# Patient Record
Sex: Female | Born: 1965 | Race: Black or African American | Hispanic: No | Marital: Married | Smoking: Current some day smoker
Health system: Southern US, Community
[De-identification: ages and names within clinical notes are randomized; demographics above are authoritative.]

## PROBLEM LIST (undated history)

## (undated) ENCOUNTER — Emergency Department (HOSPITAL_COMMUNITY): Disposition: A | Discharge status: Other Acute Inpatient Hospital | Payer: Self-pay

## (undated) DIAGNOSIS — R7303 Prediabetes: Secondary | ICD-10-CM

## (undated) DIAGNOSIS — I639 Cerebral infarction, unspecified: Secondary | ICD-10-CM

## (undated) DIAGNOSIS — E782 Mixed hyperlipidemia: Secondary | ICD-10-CM

## (undated) DIAGNOSIS — E079 Disorder of thyroid, unspecified: Secondary | ICD-10-CM

## (undated) DIAGNOSIS — Z8673 Personal history of transient ischemic attack (TIA), and cerebral infarction without residual deficits: Secondary | ICD-10-CM

## (undated) DIAGNOSIS — E785 Hyperlipidemia, unspecified: Secondary | ICD-10-CM

## (undated) DIAGNOSIS — E039 Hypothyroidism, unspecified: Secondary | ICD-10-CM

## (undated) DIAGNOSIS — I10 Essential (primary) hypertension: Secondary | ICD-10-CM

## (undated) HISTORY — PX: OTHER SURGICAL HISTORY: SHX169

## (undated) HISTORY — PX: THYROIDECTOMY: SHX17

## (undated) HISTORY — DX: Prediabetes: R73.03

## (undated) HISTORY — PX: PARTIAL HYSTERECTOMY: SHX80

## (undated) HISTORY — DX: Cerebral infarction, unspecified: I63.9

## (undated) HISTORY — DX: Personal history of transient ischemic attack (TIA), and cerebral infarction without residual deficits: Z86.73

## (undated) HISTORY — DX: Mixed hyperlipidemia: E78.2

## (undated) HISTORY — DX: Hyperlipidemia, unspecified: E78.5

---

## 1999-03-25 ENCOUNTER — Ambulatory Visit (HOSPITAL_BASED_OUTPATIENT_CLINIC_OR_DEPARTMENT_OTHER): Admission: RE | Admit: 1999-03-25 | Discharge: 1999-03-25 | Payer: Self-pay | Admitting: Orthopedic Surgery

## 1999-04-24 ENCOUNTER — Encounter: Payer: Self-pay | Admitting: Emergency Medicine

## 1999-04-24 ENCOUNTER — Emergency Department (HOSPITAL_COMMUNITY): Admission: EM | Admit: 1999-04-24 | Discharge: 1999-04-24 | Payer: Self-pay | Admitting: Emergency Medicine

## 1999-12-29 ENCOUNTER — Emergency Department (HOSPITAL_COMMUNITY): Admission: EM | Admit: 1999-12-29 | Discharge: 1999-12-29 | Payer: Self-pay | Admitting: Emergency Medicine

## 1999-12-29 ENCOUNTER — Encounter: Payer: Self-pay | Admitting: Emergency Medicine

## 2000-06-14 ENCOUNTER — Encounter: Payer: Self-pay | Admitting: *Deleted

## 2000-06-14 ENCOUNTER — Observation Stay (HOSPITAL_COMMUNITY): Admission: EM | Admit: 2000-06-14 | Discharge: 2000-06-15 | Payer: Self-pay | Admitting: Podiatry

## 2000-06-15 ENCOUNTER — Encounter: Payer: Self-pay | Admitting: Neurosurgery

## 2000-06-16 ENCOUNTER — Encounter: Payer: Self-pay | Admitting: Neurosurgery

## 2000-06-16 ENCOUNTER — Emergency Department (HOSPITAL_COMMUNITY): Admission: EM | Admit: 2000-06-16 | Discharge: 2000-06-16 | Payer: Self-pay | Admitting: Emergency Medicine

## 2000-08-02 ENCOUNTER — Emergency Department (HOSPITAL_COMMUNITY): Admission: EM | Admit: 2000-08-02 | Discharge: 2000-08-02 | Payer: Self-pay | Admitting: Emergency Medicine

## 2000-08-26 ENCOUNTER — Emergency Department (HOSPITAL_COMMUNITY): Admission: EM | Admit: 2000-08-26 | Discharge: 2000-08-27 | Payer: Self-pay | Admitting: Emergency Medicine

## 2002-07-31 ENCOUNTER — Encounter: Payer: Self-pay | Admitting: Emergency Medicine

## 2002-07-31 ENCOUNTER — Emergency Department (HOSPITAL_COMMUNITY): Admission: EM | Admit: 2002-07-31 | Discharge: 2002-07-31 | Payer: Self-pay | Admitting: Emergency Medicine

## 2002-08-04 ENCOUNTER — Emergency Department (HOSPITAL_COMMUNITY): Admission: EM | Admit: 2002-08-04 | Discharge: 2002-08-04 | Payer: Self-pay | Admitting: Emergency Medicine

## 2002-08-04 ENCOUNTER — Encounter: Payer: Self-pay | Admitting: Emergency Medicine

## 2002-09-26 ENCOUNTER — Encounter: Payer: Self-pay | Admitting: Internal Medicine

## 2002-09-26 ENCOUNTER — Ambulatory Visit (HOSPITAL_COMMUNITY): Admission: RE | Admit: 2002-09-26 | Discharge: 2002-09-26 | Payer: Self-pay | Admitting: Internal Medicine

## 2002-11-05 ENCOUNTER — Encounter (HOSPITAL_COMMUNITY): Admission: RE | Admit: 2002-11-05 | Discharge: 2003-02-03 | Payer: Self-pay | Admitting: Internal Medicine

## 2002-11-06 ENCOUNTER — Encounter: Payer: Self-pay | Admitting: Internal Medicine

## 2002-11-29 HISTORY — PX: THYMECTOMY: SHX1063

## 2002-12-12 ENCOUNTER — Emergency Department (HOSPITAL_COMMUNITY): Admission: EM | Admit: 2002-12-12 | Discharge: 2002-12-13 | Payer: Self-pay | Admitting: Emergency Medicine

## 2002-12-13 ENCOUNTER — Encounter: Payer: Self-pay | Admitting: Emergency Medicine

## 2003-01-10 ENCOUNTER — Encounter: Payer: Self-pay | Admitting: Cardiothoracic Surgery

## 2003-01-10 ENCOUNTER — Encounter: Admission: RE | Admit: 2003-01-10 | Discharge: 2003-01-10 | Payer: Self-pay | Admitting: Cardiothoracic Surgery

## 2003-01-18 ENCOUNTER — Encounter: Payer: Self-pay | Admitting: Cardiothoracic Surgery

## 2003-01-22 ENCOUNTER — Inpatient Hospital Stay (HOSPITAL_COMMUNITY): Admission: RE | Admit: 2003-01-22 | Discharge: 2003-01-24 | Payer: Self-pay | Admitting: Cardiothoracic Surgery

## 2003-01-22 ENCOUNTER — Encounter: Payer: Self-pay | Admitting: Cardiothoracic Surgery

## 2003-01-22 ENCOUNTER — Encounter (INDEPENDENT_AMBULATORY_CARE_PROVIDER_SITE_OTHER): Payer: Self-pay | Admitting: *Deleted

## 2003-01-23 ENCOUNTER — Encounter: Payer: Self-pay | Admitting: Cardiothoracic Surgery

## 2003-01-25 ENCOUNTER — Encounter: Payer: Self-pay | Admitting: Emergency Medicine

## 2003-01-25 ENCOUNTER — Emergency Department (HOSPITAL_COMMUNITY): Admission: EM | Admit: 2003-01-25 | Discharge: 2003-01-25 | Payer: Self-pay | Admitting: Emergency Medicine

## 2003-02-21 ENCOUNTER — Encounter: Payer: Self-pay | Admitting: Cardiothoracic Surgery

## 2003-02-21 ENCOUNTER — Encounter: Admission: RE | Admit: 2003-02-21 | Discharge: 2003-02-21 | Payer: Self-pay | Admitting: Cardiothoracic Surgery

## 2003-08-12 ENCOUNTER — Emergency Department (HOSPITAL_COMMUNITY): Admission: EM | Admit: 2003-08-12 | Discharge: 2003-08-12 | Payer: Self-pay | Admitting: Emergency Medicine

## 2003-08-12 ENCOUNTER — Encounter: Payer: Self-pay | Admitting: Emergency Medicine

## 2003-11-04 ENCOUNTER — Emergency Department (HOSPITAL_COMMUNITY): Admission: EM | Admit: 2003-11-04 | Discharge: 2003-11-04 | Payer: Self-pay | Admitting: Emergency Medicine

## 2005-02-28 ENCOUNTER — Emergency Department (HOSPITAL_COMMUNITY): Admission: EM | Admit: 2005-02-28 | Discharge: 2005-02-28 | Payer: Self-pay

## 2005-05-23 ENCOUNTER — Emergency Department (HOSPITAL_COMMUNITY): Admission: EM | Admit: 2005-05-23 | Discharge: 2005-05-23 | Payer: Self-pay | Admitting: Emergency Medicine

## 2005-05-25 ENCOUNTER — Emergency Department (HOSPITAL_COMMUNITY): Admission: EM | Admit: 2005-05-25 | Discharge: 2005-05-25 | Payer: Self-pay | Admitting: Emergency Medicine

## 2005-12-04 ENCOUNTER — Emergency Department (HOSPITAL_COMMUNITY): Admission: EM | Admit: 2005-12-04 | Discharge: 2005-12-04 | Payer: Self-pay | Admitting: Emergency Medicine

## 2006-01-15 ENCOUNTER — Emergency Department (HOSPITAL_COMMUNITY): Admission: EM | Admit: 2006-01-15 | Discharge: 2006-01-15 | Payer: Self-pay | Admitting: Emergency Medicine

## 2006-02-03 ENCOUNTER — Emergency Department (HOSPITAL_COMMUNITY): Admission: EM | Admit: 2006-02-03 | Discharge: 2006-02-03 | Payer: Self-pay | Admitting: Family Medicine

## 2006-12-01 ENCOUNTER — Emergency Department (HOSPITAL_COMMUNITY): Admission: EM | Admit: 2006-12-01 | Discharge: 2006-12-01 | Payer: Self-pay | Admitting: Emergency Medicine

## 2007-11-01 ENCOUNTER — Emergency Department (HOSPITAL_COMMUNITY): Admission: EM | Admit: 2007-11-01 | Discharge: 2007-11-01 | Payer: Self-pay | Admitting: Family Medicine

## 2008-02-08 ENCOUNTER — Emergency Department (HOSPITAL_COMMUNITY): Admission: EM | Admit: 2008-02-08 | Discharge: 2008-02-08 | Payer: Self-pay | Admitting: Family Medicine

## 2008-02-13 ENCOUNTER — Emergency Department (HOSPITAL_COMMUNITY): Admission: EM | Admit: 2008-02-13 | Discharge: 2008-02-13 | Payer: Self-pay | Admitting: Emergency Medicine

## 2008-02-27 ENCOUNTER — Emergency Department (HOSPITAL_COMMUNITY): Admission: EM | Admit: 2008-02-27 | Discharge: 2008-02-27 | Payer: Self-pay | Admitting: Emergency Medicine

## 2008-03-11 ENCOUNTER — Observation Stay (HOSPITAL_COMMUNITY): Admission: EM | Admit: 2008-03-11 | Discharge: 2008-03-12 | Payer: Self-pay | Admitting: Emergency Medicine

## 2008-03-19 ENCOUNTER — Ambulatory Visit (HOSPITAL_COMMUNITY): Admission: RE | Admit: 2008-03-19 | Discharge: 2008-03-19 | Payer: Self-pay | Admitting: Cardiovascular Disease

## 2008-04-05 ENCOUNTER — Emergency Department (HOSPITAL_COMMUNITY): Admission: EM | Admit: 2008-04-05 | Discharge: 2008-04-05 | Payer: Self-pay | Admitting: Emergency Medicine

## 2008-05-01 ENCOUNTER — Emergency Department (HOSPITAL_COMMUNITY): Admission: EM | Admit: 2008-05-01 | Discharge: 2008-05-01 | Payer: Self-pay | Admitting: Family Medicine

## 2008-06-29 ENCOUNTER — Emergency Department (HOSPITAL_COMMUNITY): Admission: EM | Admit: 2008-06-29 | Discharge: 2008-06-30 | Payer: Self-pay | Admitting: Emergency Medicine

## 2008-06-30 ENCOUNTER — Emergency Department (HOSPITAL_COMMUNITY): Admission: EM | Admit: 2008-06-30 | Discharge: 2008-06-30 | Payer: Self-pay | Admitting: Emergency Medicine

## 2008-07-18 ENCOUNTER — Emergency Department (HOSPITAL_COMMUNITY): Admission: EM | Admit: 2008-07-18 | Discharge: 2008-07-18 | Payer: Self-pay | Admitting: Emergency Medicine

## 2008-11-08 IMAGING — CR DG CHEST 1V PORT
1 series · 1 of 1 positions shown · non-contrast
Comparison: 03/11/2008

CLINICAL DATA: Dyspnea.

PORTABLE CHEST - 1 VIEW

[AP]
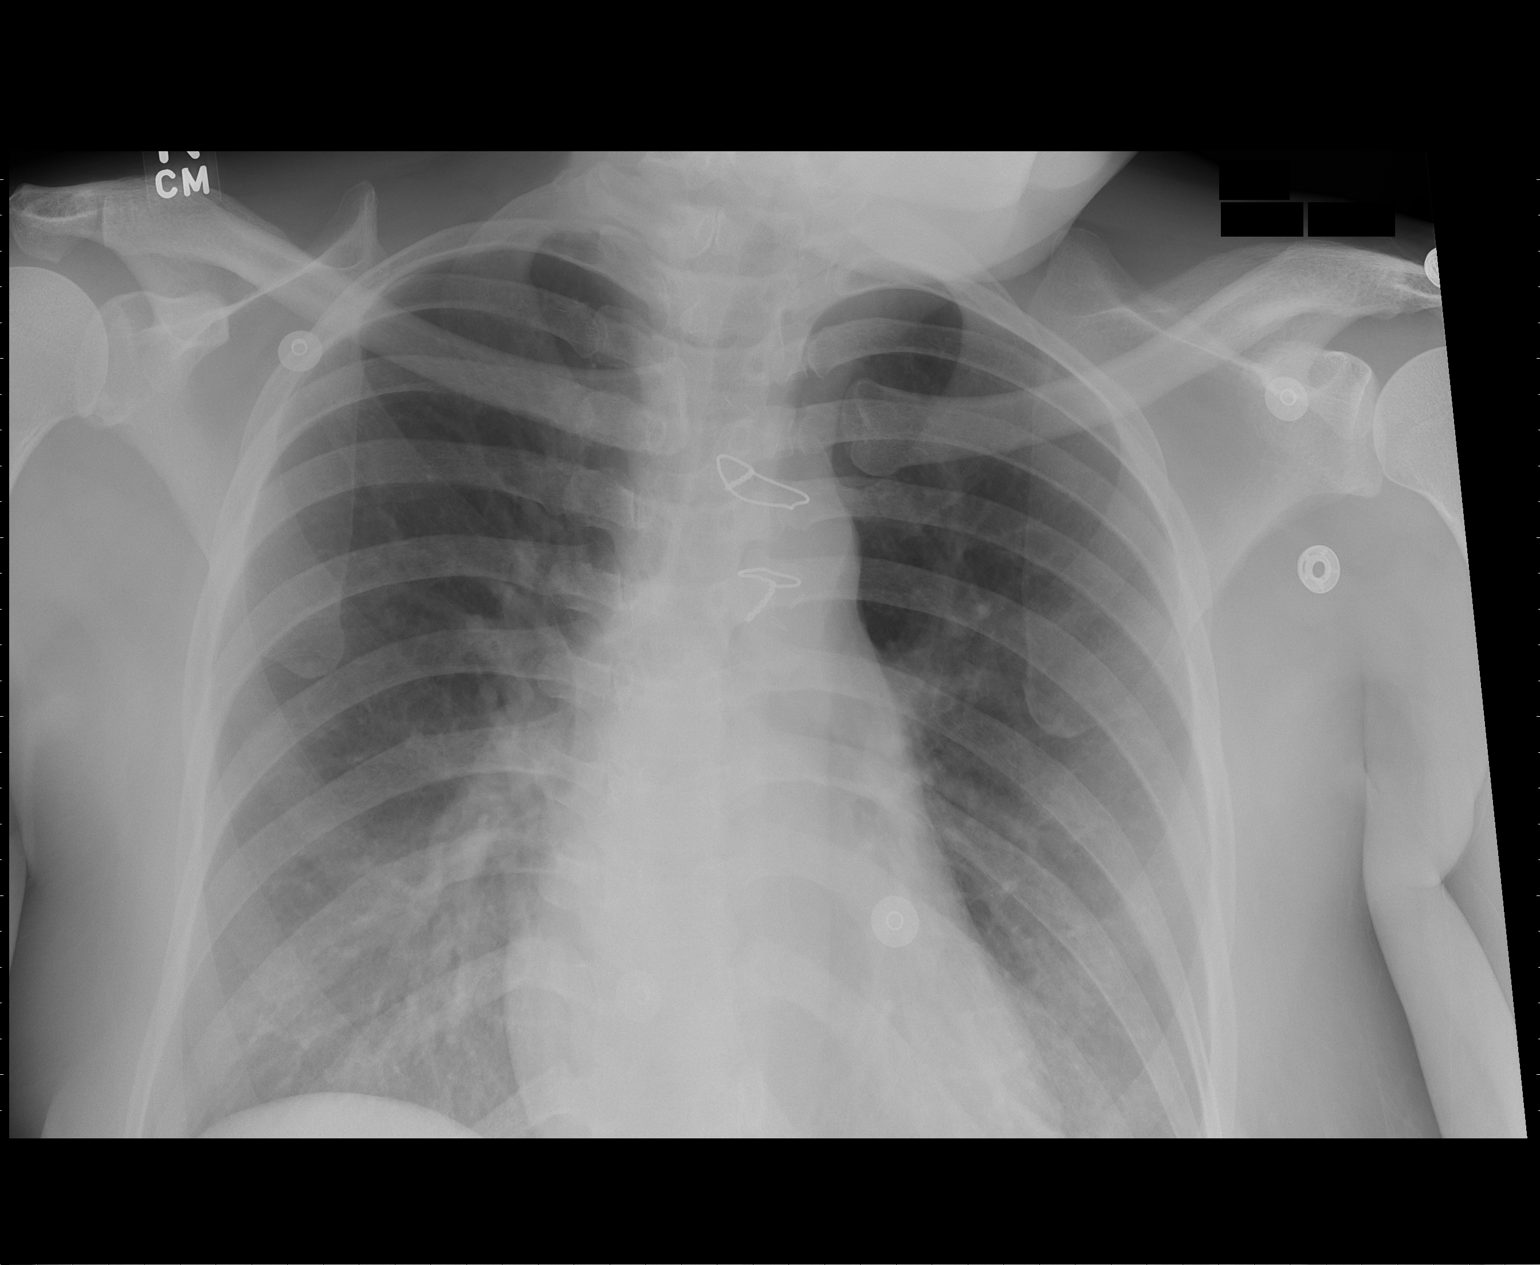

[1 of 1 positions shown; findings below may reference images not displayed]

FINDINGS: Prior median sternotomy.  Midline trachea.  Heart size
upper limits of normal.  Left costophrenic angle partially
excluded.  No pneumothorax or pleural effusion.  Mild pulmonary
interstitial prominence.
IMPRESSION: 1.  Borderline cardiomegaly with mild pulmonary interstitial
prominence.  This could be due to AP portable technique or mild
pulmonary venous congestion.  No overt congestive failure.

## 2009-06-02 ENCOUNTER — Emergency Department (HOSPITAL_COMMUNITY): Admission: EM | Admit: 2009-06-02 | Discharge: 2009-06-02 | Payer: Self-pay | Admitting: Emergency Medicine

## 2009-06-04 ENCOUNTER — Emergency Department (HOSPITAL_COMMUNITY): Admission: EM | Admit: 2009-06-04 | Discharge: 2009-06-04 | Payer: Self-pay | Admitting: Family Medicine

## 2009-09-03 ENCOUNTER — Emergency Department (HOSPITAL_COMMUNITY): Admission: EM | Admit: 2009-09-03 | Discharge: 2009-09-03 | Payer: Self-pay | Admitting: Family Medicine

## 2009-12-11 ENCOUNTER — Emergency Department (HOSPITAL_COMMUNITY): Admission: EM | Admit: 2009-12-11 | Discharge: 2009-12-11 | Payer: Self-pay | Admitting: Emergency Medicine

## 2010-01-03 ENCOUNTER — Emergency Department (HOSPITAL_COMMUNITY): Admission: EM | Admit: 2010-01-03 | Discharge: 2010-01-04 | Payer: Self-pay | Admitting: Emergency Medicine

## 2010-07-27 ENCOUNTER — Emergency Department (HOSPITAL_COMMUNITY): Admission: EM | Admit: 2010-07-27 | Discharge: 2010-07-27 | Payer: Self-pay | Admitting: Family Medicine

## 2010-10-11 ENCOUNTER — Emergency Department (HOSPITAL_COMMUNITY)
Admission: EM | Admit: 2010-10-11 | Discharge: 2010-10-11 | Payer: Self-pay | Source: Home / Self Care | Admitting: Family Medicine

## 2011-01-12 ENCOUNTER — Inpatient Hospital Stay (INDEPENDENT_AMBULATORY_CARE_PROVIDER_SITE_OTHER)
Admission: RE | Admit: 2011-01-12 | Discharge: 2011-01-12 | Disposition: A | Discharge status: Home / Self Care | Payer: Self-pay | Source: Ambulatory Visit | Attending: Emergency Medicine | Admitting: Emergency Medicine

## 2011-01-12 DIAGNOSIS — F410 Panic disorder [episodic paroxysmal anxiety] without agoraphobia: Secondary | ICD-10-CM

## 2011-02-19 LAB — POCT I-STAT, CHEM 8
Glucose, Bld: 93 mg/dL (ref 70–99)
HCT: 42 % (ref 36.0–46.0)
Hemoglobin: 14.3 g/dL (ref 12.0–15.0)
Potassium: 3.9 mEq/L (ref 3.5–5.1)
Sodium: 138 mEq/L (ref 135–145)

## 2011-02-19 LAB — CBC
HCT: 38.6 % (ref 36.0–46.0)
Platelets: 183 10*3/uL (ref 150–400)
RDW: 14.2 % (ref 11.5–15.5)

## 2011-03-04 ENCOUNTER — Inpatient Hospital Stay (INDEPENDENT_AMBULATORY_CARE_PROVIDER_SITE_OTHER)
Admission: RE | Admit: 2011-03-04 | Discharge: 2011-03-04 | Disposition: A | Discharge status: Home / Self Care | Payer: Self-pay | Source: Ambulatory Visit

## 2011-03-04 DIAGNOSIS — R112 Nausea with vomiting, unspecified: Secondary | ICD-10-CM
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
## 2011-04-13 NOTE — Discharge Summary (Signed)
Meagan Flores, Meagan Flores                  ACCOUNT NO.:  [id number]   MEDICAL RECORD NO.:  [id number]          PATIENT TYPE:  INP   LOCATION:  3705                         FACILITY:  [facility]   PHYSICIAN:  Isidor Holts, M.D.  DATE OF BIRTH:  1966/06/12   DATE OF ADMISSION:  03/11/2008  DATE OF DISCHARGE:  03/12/2008                               DISCHARGE SUMMARY   PRIMARY CARE PHYSICIAN:  Unassigned.   DISCHARGE DIAGNOSES:  1. Atypical chest pain, i.e., musculoskeletal.  2. Smoking history.  3. Graves disease/hyperthyroidism.  4. Mild microcytic anemia.  5. Right adnexial cyst.   DISCHARGE MEDICATIONS:  1. Motrin 600 mg p.o. t.i.d. with food for 1 week only.  2. Vicodin 35/500 one p.o. p.r.n. q.6h.  A total of 20 pills have been      dispensed.   PROCEDURES:  1. Chest x-ray dated March 11, 2008.  This showed no acute      cardiopulmonary process.  There was normal mediastinum and cardiac      silhouette.  Costophrenic angles are clear.  2. Chest CT angiogram dated March 12, 2008.  This showed no evidence      of acute pulmonary embolus.  There was thyroid enlargement      compatible with Graves disease.  Right anterior prevascular space      soft tissue may reflect residual thymus or postoperative scarring.  3. Abdominal CT scan dated March 12, 2008.  This showed no acute      findings in the abdomen.  4. Pelvic CT scan dated March 12, 2008.  This showed 9 mm right      adnexal cystic lesion which may be physiologic in a premenopausal      outpatient.  Follow-up ultrasound of pelvis is recommended in six      weeks to evaluate regression/stability.   CONSULTATION:  [facility].   ADMISSION HISTORY:  As in H&P notes of March 11, 2008, dictated by Dr.  Buena Irish.  However, in brief, this is a 45 year old female,  with known history of hyperthyroidism secondary to Graves disease.  Currently on follow-up at [facility] in [facility], status  post  thymectomy, status post partial hysterectomy, history of surgery  right arm ulnar nerve x2, smoking history, who presents with a 12-hour  history of chest pain which she describes as a pulling sensation when  she lifts her arm and is associated with localized chest wall soreness.  She had previously been to the emergency department, discharged and then  represented.  She was therefore admitted for further evaluation,  investigation and management.   CLINICAL COURSE:  1. Chest pain.  This certainly has atypical features.  However, the      patient does smoke.  She was therefore placed on telemetric      monitoring.  Cardiac enzymes were cycled and remained unelevated.      A 12-lead EKG showed no acute ischemic changes.  The patient was      managed with NSAID therapy and by March 12, 2008, a.m. was  asymptomatic.  Chest CT angiogram showed no evidence of pulmonary      embolism.  The patient has therefore been reassured accordingly.      In view of the fact that she is a smoker and that she has presented      at least twice to the emergency department, it is felt appropriate      to arrange an outpatient stress test.  [facility] has therefore been consulted for this purpose.   1. Smoking history.  The patient was counseled appropriately.   1. Hyperthyroidism.  The patient's TSH was still significantly low at      0.005, however, she is on follow-up at the Endocrinology Clinic at      [facility]  She has been urged to continue to follow up      there.   1. Microcytic anemia.  The patient had a mild microcytic anemia with      hemoglobin 10.9, MCV 72.4.  In this African American patient, it is      likely that this is due to thalassemia trait.  However, it will      bear follow-up, on an outpatient basis.   1. Right adnexal cyst.  This was an incidental finding on abdominal CT      scan of March 12, 2008, and likely represents a benign finding,       however, ultrasound follow-up for interval change is recommended in      six weeks.  This has been communicated to the patient.   DISPOSITION:  The patient was, on March 12, 2008, considered clinically  stable for discharge.  She was therefore discharged accordingly.  She is recommended to return to regular duties on February 14, 2008.   DIET:  No restrictions.   ACTIVITY:  As tolerated.   FOLLOW-UP INSTRUCTIONS:  The patient has assured me that she is going to  [facility] on March 14, 2008, to register for a primary MD follow-up.  She has therefore been strongly urged to keep this appointment, that she  will require primary MD for routine and preventative care.  [facility] will contact patient to schedule  outpatient stress testing on a date to be determined. Tel#[phone].   SPECIAL INSTRUCTIONS:  It is recommended that the patient's primary MD  arrange a pelvic ultrasound scan in six weeks to evaluate interval  evolution of right adnexal cyst.  Also, as mentioned above, the  patient's hemoglobin will bear monitoring on an outpatient basis.      Isidor Holts, M.D.  Electronically Signed     CO/MEDQ  D:  03/12/2008  T:  03/12/2008  Job:  [id number]   cc:   Melvern Banker

## 2011-04-14 ENCOUNTER — Emergency Department (HOSPITAL_COMMUNITY)
Admission: EM | Admit: 2011-04-14 | Discharge: 2011-04-15 | Discharge status: Against Medical Advice | Payer: Self-pay | Attending: Emergency Medicine | Admitting: Emergency Medicine

## 2011-04-14 DIAGNOSIS — R5381 Other malaise: Secondary | ICD-10-CM | POA: Insufficient documentation

## 2011-04-14 DIAGNOSIS — R112 Nausea with vomiting, unspecified: Secondary | ICD-10-CM | POA: Insufficient documentation
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
## 2011-04-16 NOTE — Op Note (Signed)
NAME:  Meagan Flores, CAMPOY                            ACCOUNT NO.:  [id number]   MEDICAL RECORD NO.:  [id number]                   PATIENT TYPE:  INP   LOCATION:  3305                                 FACILITY:  [facility]   PHYSICIAN:  Gwenith Daily. Tyrone Sage, M.D.            DATE OF BIRTH:  07/25/1966   DATE OF PROCEDURE:  01/23/2003  DATE OF DISCHARGE:                                 OPERATIVE REPORT   PREOPERATIVE DIAGNOSIS:  Thymic enlargement.   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   OPERATION PERFORMED:  Partial sternotomy with thymectomy.   SURGEON:  Gwenith Daily. Tyrone Sage, M.D.   ASSISTANT:  Toribio Harbour, R.N.   ANESTHESIA:  General.   INDICATIONS FOR PROCEDURE:  The patient is a 45 year old female who has been  followed at [facility] since September of 2003.  Initially she  presented with vague chest discomfort and a CT scan of the chest was  obtained which showed a thymic enlargement.  Follow-up scans showed the  progressive enlargement of the thymus.  PET scan showed some vague area of  uptake in the left hilar region but was nonspecific.  Because of the  persistent nature of this enlargement and the question of a positive PET  scan, obtaining thymic tissue was recommended to the patient who agreed and  signed informed consent.   DESCRIPTION OF PROCEDURE:  The patient underwent general endotracheal  anesthesia without incident.  The chest was prepped with Betadine and draped  in the usual sterile manner.  An incision was made over the upper portion of  the sternum.  The manubrium was divided with sagittal saw using a lamina  spreader.  Access to the superior mediastinum was obtained.  The thymus was  identified.  It appeared to be bilobed without any definite mass lesion.  There was no adhesion.  Using careful teasing of the thymic tissue, the  thymus was removed with no evidence of an invasion.  A portion of this  was  sent for frozen section and showed only thymic hyperplasia without any  evidence of mass or lymphoma.  With the operative field hemostatic, the  incision was closed with two sternal wires in the manubrium, interrupted 0  Vicryl in the fascia, 2-0 Vicryl in the subcutaneous tissue and a 4-0  subcuticular stitch in the skin edge.  Dry dressings were applied.  The  patient was awakened and extubated in the operating room.  The estimated  blood loss was minimal.                                                 Ramon Dredge B. Tyrone Sage, M.D.    Tyson Babinski  D:  01/23/2003  T:  01/24/2003  Job:  [id number]   cc:   [facility]
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
## 2011-08-23 LAB — POCT URINALYSIS DIP (DEVICE)
Bilirubin Urine: NEGATIVE
Nitrite: NEGATIVE
Urobilinogen, UA: 1
pH: 5.5

## 2011-08-23 LAB — URINALYSIS, ROUTINE W REFLEX MICROSCOPIC
Bilirubin Urine: NEGATIVE
Ketones, ur: NEGATIVE
Nitrite: NEGATIVE
Protein, ur: NEGATIVE
Specific Gravity, Urine: 1.031 — ABNORMAL HIGH
Urobilinogen, UA: 1

## 2011-08-23 LAB — CBC
HCT: 31.9 — ABNORMAL LOW
MCHC: 33.5
MCV: 72.8 — ABNORMAL LOW
Platelets: 205
RDW: 13.3

## 2011-08-23 LAB — DIFFERENTIAL
Basophils Absolute: 0
Basophils Relative: 0
Eosinophils Absolute: 0.1
Eosinophils Relative: 1
Monocytes Absolute: 1

## 2011-08-23 LAB — D-DIMER, QUANTITATIVE: D-Dimer, Quant: 0.29

## 2011-08-23 LAB — POCT I-STAT, CHEM 8
Calcium, Ion: 1.14
Glucose, Bld: 102 — ABNORMAL HIGH
HCT: 34 — ABNORMAL LOW
TCO2: 27

## 2011-08-23 LAB — POCT CARDIAC MARKERS
CKMB, poc: <1 — ABNORMAL LOW
Operator id: 277751
Operator id: 277751
Troponin i, poc: <0.05

## 2011-08-23 LAB — POCT PREGNANCY, URINE: Operator id: 277751

## 2011-08-24 LAB — COMPREHENSIVE METABOLIC PANEL
ALT: 22
Alkaline Phosphatase: 58
CO2: 23
Chloride: 108
Glucose, Bld: 116 — ABNORMAL HIGH
Potassium: 3.8
Sodium: 139
Total Protein: 5.8 — ABNORMAL LOW

## 2011-08-24 LAB — POCT CARDIAC MARKERS
CKMB, poc: <1 — ABNORMAL LOW
Myoglobin, poc: 47.1
Operator id: 277751
Troponin i, poc: <0.05

## 2011-08-24 LAB — URINE CULTURE

## 2011-08-24 LAB — IRON AND TIBC: UIBC: 164

## 2011-08-24 LAB — DIFFERENTIAL
Basophils Absolute: 0
Basophils Relative: 0
Lymphocytes Relative: 31
Monocytes Absolute: 0.8
Neutro Abs: 2.9
Neutrophils Relative %: 53

## 2011-08-24 LAB — FERRITIN: Ferritin: 262 (ref 10–291)

## 2011-08-24 LAB — CBC
Hemoglobin: 10.9 — ABNORMAL LOW
MCHC: 32.4
RBC: 4.67
RDW: 13.2

## 2011-08-24 LAB — TRANSFERRIN: Transferrin: 163 — ABNORMAL LOW

## 2011-08-24 LAB — URINALYSIS, ROUTINE W REFLEX MICROSCOPIC
Nitrite: NEGATIVE
Protein, ur: NEGATIVE
Specific Gravity, Urine: 1.016
Urobilinogen, UA: 0.2

## 2011-08-24 LAB — TSH: TSH: 0.005 — ABNORMAL LOW

## 2011-08-24 LAB — LIPID PANEL
Cholesterol: 124
HDL: 28 — ABNORMAL LOW

## 2011-08-24 LAB — SEDIMENTATION RATE: Sed Rate: 15

## 2011-08-24 LAB — B-NATRIURETIC PEPTIDE (CONVERTED LAB): Pro B Natriuretic peptide (BNP): 77

## 2011-08-24 LAB — CK TOTAL AND CKMB (NOT AT ARMC): Total CK: 63

## 2011-08-27 LAB — URINALYSIS, ROUTINE W REFLEX MICROSCOPIC
Glucose, UA: NEGATIVE
Glucose, UA: NEGATIVE
Hgb urine dipstick: NEGATIVE
Specific Gravity, Urine: 1.015
Specific Gravity, Urine: 1.026
pH: 5.5

## 2011-08-27 LAB — GC/CHLAMYDIA PROBE AMP, GENITAL: GC Probe Amp, Genital: NEGATIVE

## 2011-08-27 LAB — WET PREP, GENITAL
Trich, Wet Prep: NONE SEEN
Yeast Wet Prep HPF POC: NONE SEEN

## 2011-08-28 ENCOUNTER — Inpatient Hospital Stay (INDEPENDENT_AMBULATORY_CARE_PROVIDER_SITE_OTHER)
Admission: RE | Admit: 2011-08-28 | Discharge: 2011-08-28 | Disposition: A | Discharge status: Home / Self Care | Payer: Self-pay | Source: Ambulatory Visit | Attending: Emergency Medicine | Admitting: Emergency Medicine

## 2011-08-28 DIAGNOSIS — A499 Bacterial infection, unspecified: Secondary | ICD-10-CM

## 2011-08-28 DIAGNOSIS — N760 Acute vaginitis: Secondary | ICD-10-CM

## 2011-08-28 DIAGNOSIS — B9689 Other specified bacterial agents as the cause of diseases classified elsewhere: Secondary | ICD-10-CM

## 2011-08-28 DIAGNOSIS — L259 Unspecified contact dermatitis, unspecified cause: Secondary | ICD-10-CM

## 2011-08-28 LAB — HIV ANTIBODY (ROUTINE TESTING W REFLEX): HIV: NONREACTIVE

## 2011-08-28 LAB — RPR: RPR Ser Ql: NONREACTIVE

## 2011-08-30 LAB — GC/CHLAMYDIA PROBE AMP, GENITAL: Chlamydia, DNA Probe: NEGATIVE

## 2011-09-06 LAB — POCT RAPID STREP A: Streptococcus, Group A Screen (Direct): NEGATIVE

## 2012-03-11 ENCOUNTER — Emergency Department (HOSPITAL_COMMUNITY)
Admission: EM | Admit: 2012-03-11 | Discharge: 2012-03-11 | Disposition: A | Discharge status: Home / Self Care | Payer: Self-pay | Attending: Emergency Medicine | Admitting: Emergency Medicine

## 2012-03-11 ENCOUNTER — Encounter (HOSPITAL_COMMUNITY): Payer: Self-pay | Admitting: *Deleted

## 2012-03-11 DIAGNOSIS — K0889 Other specified disorders of teeth and supporting structures: Secondary | ICD-10-CM

## 2012-03-11 DIAGNOSIS — K029 Dental caries, unspecified: Secondary | ICD-10-CM | POA: Insufficient documentation

## 2012-03-11 DIAGNOSIS — K089 Disorder of teeth and supporting structures, unspecified: Secondary | ICD-10-CM | POA: Insufficient documentation

## 2012-03-11 DIAGNOSIS — X58XXXA Exposure to other specified factors, initial encounter: Secondary | ICD-10-CM | POA: Insufficient documentation

## 2012-03-11 DIAGNOSIS — R6884 Jaw pain: Secondary | ICD-10-CM | POA: Insufficient documentation

## 2012-03-11 DIAGNOSIS — S025XXA Fracture of tooth (traumatic), initial encounter for closed fracture: Secondary | ICD-10-CM | POA: Insufficient documentation

## 2012-03-11 MED ORDER — OXYCODONE-ACETAMINOPHEN 5-325 MG PO TABS
1.0000 | ORAL_TABLET | Freq: Once | ORAL | Status: AC
  Administered 2012-03-11: 1 via ORAL
  Filled 2012-03-11: qty 1

## 2012-03-11 MED ORDER — OXYCODONE-ACETAMINOPHEN 5-325 MG PO TABS: 1.0000 | ORAL_TABLET | ORAL | Status: AC | PRN

## 2012-03-11 NOTE — ED Notes (Signed)
Patient complaining of toothache for the last two days; patient reports broken tooth on upper left portion of mouth.  Patient reports that the tooth has been chipped for a month, and that the tooth suddenly broke in half and that the pain has become severe in the last two days.  Patient reports pain 10/10 on the numerical pain scale.  Describes pain as "sharp" and "throbbing".  Patient alert and oriented x4; PERRL present. Family present at bedside.  Will continue to monitor.

## 2012-03-11 NOTE — ED Notes (Signed)
Patient given ice pack to apply to cheek.

## 2012-03-11 NOTE — Discharge Instructions (Signed)
°Dental Pain °A tooth ache may be caused by cavities (tooth decay). Cavities expose the nerve of the tooth to air and hot or cold temperatures. It may come from an infection or abscess (also called a boil or furuncle) around your tooth. It is also often caused by dental caries (tooth decay). This causes the pain you are having. °DIAGNOSIS  °Your caregiver can diagnose this problem by exam. °TREATMENT  °· If caused by an infection, it may be treated with medications which kill germs (antibiotics) and pain medications as prescribed by your caregiver. Take medications as directed.  °· Only take over-the-counter or prescription medicines for pain, discomfort, or fever as directed by your caregiver.  °· Whether the tooth ache today is caused by infection or dental disease, you should see your dentist as soon as possible for further care.  °SEEK MEDICAL CARE IF: °The exam and treatment you received today has been provided on an emergency basis only. This is not a substitute for complete medical or dental care. If your problem worsens or new problems (symptoms) appear, and you are unable to meet with your dentist, call or return to this location. °SEEK IMMEDIATE MEDICAL CARE IF:  °· You have a fever.  °· You develop redness and swelling of your face, jaw, or neck.  °· You are unable to open your mouth.  °· You have severe pain uncontrolled by pain medicine.  °MAKE SURE YOU:  °· Understand these instructions.  °· Will watch your condition.  °· Will get help right away if you are not doing well or get worse.  °Document Released: 11/15/2005 Document Revised: 11/04/2011 Document Reviewed: 07/03/2008 °ExitCare® Patient Information ©2012 [employer]. ° °Dental Care and Dentist Visits °Dental care supports good overall health. Regular dental visits can also help you avoid dental pain, bleeding, infection, and other more serious health problems in the future. It is important to keep the mouth healthy because diseases in the  teeth, gums, and other oral tissues can spread to other areas of the body. Some problems, such as diabetes, heart disease, and pre-term labor have been associated with poor oral health.  °See your dentist every 6 months. If you experience emergency problems such as a toothache or broken tooth, go to the dentist right away. If you see your dentist regularly, you may catch problems early. It is easier to be treated for problems in the early stages.  °WHAT TO EXPECT AT A DENTIST VISIT  °Your dentist will look for many common oral health problems and recommend proper treatment. At your regular dental visit, you can expect: °· Gentle cleaning of the teeth and gums. This includes scraping and polishing. This helps to remove the sticky substance around the teeth and gums (plaque). Plaque forms in the mouth shortly after eating. Over time, plaque hardens on the teeth as tartar. If tartar is not removed regularly, it can cause problems. Cleaning also helps remove stains.  °· Periodic X-rays. These pictures of the teeth and supporting bone will help your dentist assess the health of your teeth.  °· Periodic fluoride treatments. Fluoride is a natural mineral shown to help strengthen teeth. Fluoride treatment involves applying a fluoride gel or varnish to the teeth. It is most commonly done in children.  °· Examination of the mouth, tongue, jaws, teeth, and gums to look for any oral health problems, such as:  °· Cavities (dental caries). This is decay on the tooth caused by plaque, sugar, and acid in the mouth. It is   best to catch a cavity when it is small.  °· Inflammation of the gums caused by plaque buildup (gingivitis).  °· Problems with the mouth or malformed or misaligned teeth.  °· Oral cancer or other diseases of the soft tissues or jaws.   °KEEP YOUR TEETH AND GUMS HEALTHY °For healthy teeth and gums, follow these general guidelines as well as your dentist's specific advice: °· Have your teeth professionally cleaned at  the dentist every 6 months.  °· Brush twice daily with a fluoride toothpaste.  °· Floss your teeth daily.   °· Ask your dentist if you need fluoride supplements, treatments, or fluoride toothpaste.  °· Eat a healthy diet. Reduce foods and drinks with added sugar.  °· Avoid smoking.  °TREATMENT FOR ORAL HEALTH PROBLEMS °If you have oral health problems, treatment varies depending on the conditions present in your teeth and gums. °· Your caregiver will most likely recommend good oral hygiene at each visit.  °· For cavities, gingivitis, or other oral health disease, your caregiver will perform a procedure to treat the problem. This is typically done at a separate appointment. Sometimes your caregiver will refer you to another dental specialist for specific tooth problems or for surgery.  °SEEK IMMEDIATE DENTAL CARE IF: °· You have pain, bleeding, or soreness in the gum, tooth, jaw, or mouth area.  °· A permanent tooth becomes loose or separated from the gum socket.  °· You experience a blow or injury to the mouth or jaw area.  °Document Released: 07/28/2011 Document Revised: 11/04/2011 Document Reviewed: 07/28/2011 °ExitCare® Patient Information ©2012 [employer]. °

## 2012-03-11 NOTE — ED Provider Notes (Signed)
Medical screening examination/treatment/procedure(s) were performed by non-physician practitioner and as supervising physician I was immediately available for consultation/collaboration.  Olivia Mackie, MD 03/11/12 2133

## 2012-03-11 NOTE — ED Notes (Signed)
Patient given discharge paperwork; went over discharge instructions with patient.  Patient instructed to take Percocet as directed, to not drive while taking Percocet, and to follow up with referral first thing Monday morning.  Patient instructed to return to the ED for new, worsening, or concerning symptoms.

## 2012-03-11 NOTE — ED Notes (Signed)
PA at bedside.

## 2012-03-11 NOTE — ED Notes (Signed)
The pt has had the toothache for 4 days.  She has a broken tooth

## 2012-03-11 NOTE — ED Provider Notes (Signed)
History     CSN: [id number]  Arrival date & time 03/11/12  0109   First MD Initiated Contact with Patient 03/11/12 [phone]      Chief Complaint  Patient presents with  . Dental Pain    (Consider location/radiation/quality/duration/timing/severity/associated sxs/prior treatment) HPI Comments: Patient here with left lower 3rd molar pain - states that part of the tooth broke off about 4 days ago - she reports progressive worsening of pain and jaw pain - no fever, chills, is able to open her mouth, denies nausea or vomiting.  Patient is a 46 y.o. female presenting with tooth pain. The history is provided by the patient. No language interpreter was used.  Dental PainThe primary symptoms include mouth pain. Primary symptoms do not include dental injury, oral bleeding, oral lesions, headaches, fever, shortness of breath, sore throat, angioedema or cough. The symptoms began 3 to 5 days ago. The symptoms are unchanged. The symptoms are new. The symptoms occur constantly.  Additional symptoms include: dental sensitivity to temperature and jaw pain. Additional symptoms do not include: gum swelling, gum tenderness, purulent gums, trismus, facial swelling, trouble swallowing, pain with swallowing, excessive salivation, dry mouth, taste disturbance, drooling, ear pain, hearing loss, nosebleeds, swollen glands, goiter and fatigue.    History reviewed. No pertinent past medical history.  History reviewed. No pertinent past surgical history.  No family history on file.  History  Substance Use Topics  . Smoking status: Current Everyday Smoker  . Smokeless tobacco: Not on file  . Alcohol Use: Yes    OB History    Grav Para Term Preterm Abortions TAB SAB Ect Mult Living                  Review of Systems  Constitutional: Negative for fever and fatigue.  HENT: Negative for hearing loss, ear pain, nosebleeds, sore throat, facial swelling, drooling and trouble swallowing.   Respiratory: Negative  for cough and shortness of breath.   Neurological: Negative for headaches.  All other systems reviewed and are negative.    Allergies  Sulfa antibiotics  Home Medications   Current Outpatient Rx  Name Route Sig Dispense Refill  . IBUPROFEN 200 MG PO TABS Oral Take 400 mg by mouth every 6 (six) hours as needed. For pain    . LEVOTHYROXINE SODIUM 150 MCG PO TABS Oral Take 150 mcg by mouth daily.      BP 158/90  Pulse 65  Temp(Src) 98.3 F (36.8 C) (Oral)  Resp 20  SpO2 99%  Physical Exam  Nursing note and vitals reviewed. Constitutional: She is oriented to person, place, and time. She appears well-developed and well-nourished. No distress.  HENT:  Head: Normocephalic and atraumatic. No trismus in the jaw.  Right Ear: External ear normal.  Left Ear: External ear normal.  Nose: Nose normal.  Mouth/Throat: Oropharynx is clear and moist. Abnormal dentition. Dental caries present. No dental abscesses or uvula swelling. No oropharyngeal exudate.    Eyes: Conjunctivae are normal. Pupils are equal, round, and reactive to light. No scleral icterus.  Neck: Normal range of motion. Neck supple.  Cardiovascular: Normal rate, regular rhythm and normal heart sounds.  Exam reveals no gallop and no friction rub.   No murmur heard. Pulmonary/Chest: Effort normal and breath sounds normal. No respiratory distress. She has no wheezes. She has no rales. She exhibits no tenderness.  Abdominal: Soft. Bowel sounds are normal. She exhibits no distension. There is no tenderness.  Musculoskeletal: Normal range of motion. She exhibits  no edema and no tenderness.  Lymphadenopathy:    She has no cervical adenopathy.  Neurological: She is alert and oriented to person, place, and time. No cranial nerve deficit.  Skin: Skin is warm and dry. No rash noted. No erythema. No pallor.  Psychiatric: She has a normal mood and affect. Her behavior is normal. Judgment and thought content normal.    ED Course    Procedures (including critical care time)  Labs Reviewed - No data to display No results found.   Dental pain   MDM  Patient with partial avulsion of left lower 3rd molar with temperature sensitivity - there is no evidence of abscess - will treat the pain and refer to dentist.        Izola Price. [place], [place] 03/11/12 [phone]

## 2012-04-06 ENCOUNTER — Emergency Department (HOSPITAL_COMMUNITY)
Admission: EM | Admit: 2012-04-06 | Discharge: 2012-04-07 | Disposition: A | Discharge status: Home / Self Care | Payer: Self-pay | Attending: Emergency Medicine | Admitting: Emergency Medicine

## 2012-04-06 ENCOUNTER — Encounter (HOSPITAL_COMMUNITY): Payer: Self-pay | Admitting: Emergency Medicine

## 2012-04-06 DIAGNOSIS — K089 Disorder of teeth and supporting structures, unspecified: Secondary | ICD-10-CM | POA: Insufficient documentation

## 2012-04-06 DIAGNOSIS — K0889 Other specified disorders of teeth and supporting structures: Secondary | ICD-10-CM

## 2012-04-06 DIAGNOSIS — F17200 Nicotine dependence, unspecified, uncomplicated: Secondary | ICD-10-CM | POA: Insufficient documentation

## 2012-04-06 DIAGNOSIS — R221 Localized swelling, mass and lump, neck: Secondary | ICD-10-CM | POA: Insufficient documentation

## 2012-04-06 DIAGNOSIS — R220 Localized swelling, mass and lump, head: Secondary | ICD-10-CM | POA: Insufficient documentation

## 2012-04-06 MED ORDER — HYDROCODONE-ACETAMINOPHEN 5-500 MG PO TABS: 1.0000 | ORAL_TABLET | Freq: Four times a day (QID) | ORAL | Status: AC | PRN

## 2012-04-06 MED ORDER — PENICILLIN V POTASSIUM 500 MG PO TABS: 500.0000 mg | ORAL_TABLET | Freq: Four times a day (QID) | ORAL | Status: AC

## 2012-04-06 MED ORDER — OXYCODONE-ACETAMINOPHEN 5-325 MG PO TABS
2.0000 | ORAL_TABLET | Freq: Once | ORAL | Status: AC
  Administered 2012-04-06: 2 via ORAL
  Filled 2012-04-06: qty 2

## 2012-04-06 NOTE — ED Notes (Signed)
Has wisdom tooth pulled last week. Complaining of headache as result. States she believes dentist broke part of tooth. States she feels like the top of her head is going to blow off

## 2012-04-06 NOTE — ED Provider Notes (Signed)
46 year old female had an extraction of tooth #16 9 days ago. She complains of pain and a sense that there is a piece of tooth that was left behind. On exam, the socket appears to be healing appropriately and I cannot see or palpate any remnant of the tooth. She is referred to the dentist on call to get an x-ray to make sure that there is no retained fragment.  Dione Booze, MD 04/06/12 [phone]

## 2012-04-06 NOTE — Discharge Instructions (Signed)
Dental Pain Toothache is pain in or around a tooth. It may get worse with chewing or with cold or heat.  HOME CARE  Your dentist may use a numbing medicine during treatment. If so, you may need to avoid eating until the medicine wears off. Ask your dentist about this.   Only take medicine as told by your dentist or doctor.   Avoid chewing food near the painful tooth until after all treatment is done. Ask your dentist about this.  GET HELP RIGHT AWAY IF:   The problem gets worse or new problems appear.   You have a fever.   There is redness and puffiness (swelling) of the face, jaw, or neck.   You cannot open your mouth.   There is pain in the jaw.   There is very bad pain that is not helped by medicine.  MAKE SURE YOU:   Understand these instructions.   Will watch your condition.   Will get help right away if you are not doing well or get worse.  Document Released: 05/03/2008 Document Revised: 11/04/2011 Document Reviewed: 05/03/2008 ExitCare Patient Information 2012 [employer]. 

## 2012-04-06 NOTE — ED Provider Notes (Signed)
History     CSN: [id number]  Arrival date & time 04/06/12  2136   First MD Initiated Contact with Patient 04/06/12 2237      Chief Complaint  Patient presents with  . Dental Pain    (Consider location/radiation/quality/duration/timing/severity/associated sxs/prior treatment) Patient is a 46 y.o. female presenting with tooth pain. The history is provided by the patient. No language interpreter was used.  Dental PainThe primary symptoms include mouth pain. The symptoms began more than 1 week ago. The symptoms are worsening. The symptoms are new. The symptoms occur constantly.  Additional symptoms include: gum tenderness and facial swelling.  Patient with extraction of wisdom tooth in [place] last Tuesday.  Has been experiencing continuing pain in area of extraction, and feels like there may be tooth remnants at extraction site.  History reviewed. No pertinent past medical history.  Past Surgical History  Procedure Date  . Thyroidectomy   . Partial hysterectomy     No family history on file.  History  Substance Use Topics  . Smoking status: Current Everyday Smoker  . Smokeless tobacco: Not on file  . Alcohol Use: Yes    OB History    Grav Para Term Preterm Abortions TAB SAB Ect Mult Living                  Review of Systems  HENT: Positive for facial swelling.   All other systems reviewed and are negative.    Allergies  Sulfa antibiotics  Home Medications   Current Outpatient Rx  Name Route Sig Dispense Refill  . IBUPROFEN 200 MG PO TABS Oral Take 400 mg by mouth every 6 (six) hours as needed. For pain    . LEVOTHYROXINE SODIUM 150 MCG PO TABS Oral Take 150 mcg by mouth daily.      BP 140/91  Pulse 75  Temp(Src) 98.3 F (36.8 C) (Oral)  Resp 22  SpO2 93%  Physical Exam  Nursing note and vitals reviewed. Constitutional: She is oriented to person, place, and time.  HENT:  Mouth/Throat:    Eyes: Pupils are equal, round, and reactive to light.  Neck:  Normal range of motion. Neck supple.  Cardiovascular: Normal rate and regular rhythm.   Pulmonary/Chest: Effort normal and breath sounds normal.  Abdominal: Soft. Bowel sounds are normal.  Musculoskeletal: Normal range of motion.  Lymphadenopathy:    She has no cervical adenopathy.  Neurological: She is alert and oriented to person, place, and time.  Skin: Skin is warm and dry.  Psychiatric: She has a normal mood and affect. Her behavior is normal. Thought content normal.    ED Course  Procedures (including critical care time)  Labs Reviewed - No data to display No results found.   No diagnosis found.  Patient seen by Dr. Preston Fleeting.  No clearly defined tooth fragment palpated.  Will send home with antibiotic and analgesic, follow-up with dentist.  MDM          Jimmye Norman, NP 04/06/12 [phone]

## 2012-04-06 NOTE — ED Notes (Signed)
PT. REPORTS PAIN AT LEFT UPPER MOLAR , STATES DENTAL EXTRACTION LAST Tuesday .

## 2012-04-09 NOTE — ED Provider Notes (Signed)
Medical screening examination/treatment/procedure(s) were performed by non-physician practitioner and as supervising physician I was immediately available for consultation/collaboration.   Dione Booze, MD 04/09/12 0010

## 2013-07-04 ENCOUNTER — Emergency Department (INDEPENDENT_AMBULATORY_CARE_PROVIDER_SITE_OTHER)
Admission: EM | Admit: 2013-07-04 | Discharge: 2013-07-04 | Disposition: A | Discharge status: Home / Self Care | Payer: BC Managed Care – PPO | Source: Home / Self Care | Attending: Family Medicine | Admitting: Family Medicine

## 2013-07-04 ENCOUNTER — Encounter (HOSPITAL_COMMUNITY): Payer: Self-pay | Admitting: Emergency Medicine

## 2013-07-04 DIAGNOSIS — M545 Low back pain, unspecified: Secondary | ICD-10-CM

## 2013-07-04 HISTORY — DX: Disorder of thyroid, unspecified: E07.9

## 2013-07-04 LAB — POCT URINALYSIS DIP (DEVICE)
Bilirubin Urine: NEGATIVE
Ketones, ur: NEGATIVE mg/dL
Leukocytes, UA: NEGATIVE
Protein, ur: NEGATIVE mg/dL
Specific Gravity, Urine: 1.015 (ref 1.005–1.030)

## 2013-07-04 LAB — POCT I-STAT, CHEM 8
Chloride: 102 mEq/L (ref 96–112)
Creatinine, Ser: 0.8 mg/dL (ref 0.50–1.10)
Glucose, Bld: 100 mg/dL — ABNORMAL HIGH (ref 70–99)
HCT: 44 % (ref 36.0–46.0)
Potassium: 3.7 mEq/L (ref 3.5–5.1)
Sodium: 143 mEq/L (ref 135–145)

## 2013-07-04 MED ORDER — TRAMADOL HCL 50 MG PO TABS: 50.0000 mg | ORAL_TABLET | Freq: Three times a day (TID) | ORAL | Status: DC | PRN

## 2013-07-04 MED ORDER — IBUPROFEN 600 MG PO TABS: 600.0000 mg | ORAL_TABLET | Freq: Three times a day (TID) | ORAL | Status: DC | PRN

## 2013-07-04 MED ORDER — CYCLOBENZAPRINE HCL 10 MG PO TABS: 10.0000 mg | ORAL_TABLET | Freq: Two times a day (BID) | ORAL | Status: DC | PRN

## 2013-07-04 NOTE — ED Provider Notes (Signed)
CSN: [id number]     Arrival date & time 07/04/13  9562 History     First MD Initiated Contact with Patient 07/04/13 [phone]     Chief Complaint  Patient presents with  . Back Pain   (Consider location/radiation/quality/duration/timing/severity/associated sxs/prior Treatment) HPI Comments: 47 year old smoker female with history of hypothyroidism after thyroid resection. Here complaining of bilateral low back pain for one week. Symptoms associated with feeling tired and also having intermittent hot flashes for about 1 month (she is s/p hysterectomy) . Denies chest pain or shortness of breath. Denies low extremity edema. No PND. Patient reports taking her thyroid medications consistently and has an appointment with her endocrinologist next month she has been taking same dose for one year. 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 Denies low extremity weakness or numbness. She does have some radiation of her low back pain to the right posterior thigh. Denies urinary frequency, dysuria or incontinence. No headache, blurry vision or balance problems. Reports taking Advil intermittently with some improvement of her back pain. Back pain symptoms were worse today and she needs a work note.   Past Medical History  Diagnosis Date  . Thyroid disease    Past Surgical History  Procedure Laterality Date  . Thyroidectomy    . Partial hysterectomy     No family history on file. History  Substance Use Topics  . Smoking status: Current Every Day Smoker  . Smokeless tobacco: Not on file  . Alcohol Use: Yes   OB History   Grav Para Term Preterm Abortions TAB SAB Ect Mult Living                 Review of Systems  Constitutional: Positive for fatigue. Negative for fever and appetite change.  HENT: Negative for congestion.   Respiratory: Negative for cough.   Gastrointestinal: Positive for nausea. Negative for vomiting, abdominal pain, diarrhea and constipation.  Endocrine:  Negative for cold intolerance, heat intolerance, polydipsia, polyphagia and polyuria.  Genitourinary: Negative for dysuria, frequency, hematuria and flank pain.  Musculoskeletal: Positive for back pain.  Skin: Negative for rash.  Neurological: Negative for dizziness and headaches.  All other systems reviewed and are negative.    Allergies  Sulfa antibiotics  Home Medications   Current Outpatient Rx  Name  Route  Sig  Dispense  Refill  . levothyroxine (SYNTHROID, LEVOTHROID) 150 MCG tablet   Oral   Take 150 mcg by mouth daily.         . cyclobenzaprine (FLEXERIL) 10 MG tablet   Oral   Take 1 tablet (10 mg total) by mouth 2 (two) times daily as needed for muscle spasms.   20 tablet   0   . ibuprofen (ADVIL,MOTRIN) 600 MG tablet   Oral   Take 1 tablet (600 mg total) by mouth every 8 (eight) hours as needed for pain.   21 tablet   0   . traMADol (ULTRAM) 50 MG tablet   Oral   Take 1 tablet (50 mg total) by mouth every 8 (eight) hours as needed for pain.   21 tablet   0    BP 143/94  Pulse 66  Temp(Src) 97.8 F (36.6 C) (Oral)  Resp 16  SpO2 95% Physical Exam  Nursing note and vitals reviewed. Constitutional: She is oriented to person, place, and time. She appears well-developed and well-nourished. No distress.  Eyes: No scleral icterus.  Neck: No JVD present.  Well healed horizontal scar  from thyroid removal.   Cardiovascular: Normal rate, regular rhythm and normal heart sounds.  Exam reveals no gallop and no friction rub.   No murmur heard. Pulmonary/Chest: Effort normal and breath sounds normal. She has no wheezes. She has no rales. She exhibits no tenderness.  Abdominal: Soft. She exhibits no mass. There is no tenderness.  Musculoskeletal:  Central spine with no scoliosis or kyphosis. Fair anterior flexion and posterior extension. Patient able to walk on tip toes and heels with no difficulty foot drop or pain exacerbation. No bone prominence tenderness.  Tenderness to palpation in bilateral lumbar paravertebral muscles.  Negative straight leg test bilateral. Intact sensation and symmetric + DTRs (rotullian and achillean) in low extremities. No limping.   Lymphadenopathy:    She has no cervical adenopathy.  Neurological: She is alert and oriented to person, place, and time.  Skin: No rash noted. She is not diaphoretic.    ED Course   Procedures (including critical care time)  Labs Reviewed  POCT I-STAT, CHEM 8 - Abnormal; Notable for the following:    Glucose, Bld 100 (*)    All other components within normal limits  POCT URINALYSIS DIP (DEVICE) - Abnormal; Notable for the following:    Hgb urine dipstick TRACE (*)    All other components within normal limits   No results found. 1. Low back pain     MDM  I-stat normal.  Impress fatigue related to menopausal symptoms vs thyroid disease. Reccommended compliance with thyroid medication to keep follow up apt with endocrinologist and establish care with a PCP (contact info for primary care offices provided). Prescribed flexeril, ibuprofen and tramadol.  Supportive care including red flags for returning to medical attention discussed with patient and provided in writing.    Sharin Grave, MD 07/05/13 [phone]

## 2013-07-04 NOTE — ED Notes (Signed)
Pt c/o lower back pain onset 1 week... Has been taking advil and aleve w/temp relief... sxs also include: hot flashes, chills, vomiting, lightheaded; pain will radiate down to right leg; pain increases w/activity... Denies: numbness/tingly, inj/trauma, abd pain, urinary/gyn sxs, strenuous activity... Alert w/no signs of acute distress.

## 2013-08-16 ENCOUNTER — Encounter (HOSPITAL_COMMUNITY): Payer: Self-pay | Admitting: Emergency Medicine

## 2013-08-16 DIAGNOSIS — J029 Acute pharyngitis, unspecified: Secondary | ICD-10-CM | POA: Insufficient documentation

## 2013-08-16 DIAGNOSIS — F17200 Nicotine dependence, unspecified, uncomplicated: Secondary | ICD-10-CM | POA: Insufficient documentation

## 2013-08-16 DIAGNOSIS — H9209 Otalgia, unspecified ear: Secondary | ICD-10-CM | POA: Insufficient documentation

## 2013-08-16 DIAGNOSIS — R51 Headache: Secondary | ICD-10-CM | POA: Insufficient documentation

## 2013-08-16 NOTE — ED Notes (Signed)
Pt. reports right side headache , right throat ache " hard to swallow" and right ear ache onset yesterday .

## 2013-08-17 ENCOUNTER — Emergency Department (HOSPITAL_COMMUNITY)
Admission: EM | Admit: 2013-08-17 | Discharge: 2013-08-17 | Discharge status: Against Medical Advice | Payer: BC Managed Care – PPO | Attending: Emergency Medicine | Admitting: Emergency Medicine

## 2013-12-11 ENCOUNTER — Encounter (HOSPITAL_COMMUNITY): Payer: Self-pay | Admitting: Emergency Medicine

## 2013-12-11 ENCOUNTER — Emergency Department (HOSPITAL_COMMUNITY)
Admission: EM | Admit: 2013-12-11 | Discharge: 2013-12-11 | Disposition: A | Discharge status: Home / Self Care | Payer: BC Managed Care – PPO | Attending: Emergency Medicine | Admitting: Emergency Medicine

## 2013-12-11 ENCOUNTER — Emergency Department (HOSPITAL_COMMUNITY): Payer: BC Managed Care – PPO

## 2013-12-11 DIAGNOSIS — F17200 Nicotine dependence, unspecified, uncomplicated: Secondary | ICD-10-CM | POA: Insufficient documentation

## 2013-12-11 DIAGNOSIS — R05 Cough: Secondary | ICD-10-CM | POA: Insufficient documentation

## 2013-12-11 DIAGNOSIS — J111 Influenza due to unidentified influenza virus with other respiratory manifestations: Secondary | ICD-10-CM

## 2013-12-11 DIAGNOSIS — Z79899 Other long term (current) drug therapy: Secondary | ICD-10-CM | POA: Insufficient documentation

## 2013-12-11 DIAGNOSIS — R059 Cough, unspecified: Secondary | ICD-10-CM | POA: Insufficient documentation

## 2013-12-11 DIAGNOSIS — R69 Illness, unspecified: Secondary | ICD-10-CM

## 2013-12-11 DIAGNOSIS — E079 Disorder of thyroid, unspecified: Secondary | ICD-10-CM | POA: Insufficient documentation

## 2013-12-11 DIAGNOSIS — R079 Chest pain, unspecified: Secondary | ICD-10-CM

## 2013-12-11 DIAGNOSIS — Z882 Allergy status to sulfonamides status: Secondary | ICD-10-CM | POA: Insufficient documentation

## 2013-12-11 DIAGNOSIS — J3489 Other specified disorders of nose and nasal sinuses: Secondary | ICD-10-CM | POA: Insufficient documentation

## 2013-12-11 LAB — CBC WITH DIFFERENTIAL/PLATELET
BASOS ABS: 0 10*3/uL (ref 0.0–0.1)
Basophils Relative: 0 % (ref 0–1)
EOS ABS: 0.1 10*3/uL (ref 0.0–0.7)
Eosinophils Relative: 1 % (ref 0–5)
HCT: 39.1 % (ref 36.0–46.0)
Hemoglobin: 13.2 g/dL (ref 12.0–15.0)
Lymphocytes Relative: 38 % (ref 12–46)
Lymphs Abs: 2.7 10*3/uL (ref 0.7–4.0)
MCH: 25 pg — AB (ref 26.0–34.0)
MCHC: 33.8 g/dL (ref 30.0–36.0)
MCV: 74.2 fL — ABNORMAL LOW (ref 78.0–100.0)
MONO ABS: 0.7 10*3/uL (ref 0.1–1.0)
Monocytes Relative: 10 % (ref 3–12)
NEUTROS PCT: 51 % (ref 43–77)
Neutro Abs: 3.7 10*3/uL (ref 1.7–7.7)
PLATELETS: 216 10*3/uL (ref 150–400)
RBC: 5.27 MIL/uL — AB (ref 3.87–5.11)
RDW: 14.8 % (ref 11.5–15.5)
WBC: 7.2 10*3/uL (ref 4.0–10.5)

## 2013-12-11 LAB — BASIC METABOLIC PANEL
BUN: 13 mg/dL (ref 6–23)
CALCIUM: 8.2 mg/dL — AB (ref 8.4–10.5)
CO2: 26 mEq/L (ref 19–32)
Chloride: 104 mEq/L (ref 96–112)
Creatinine, Ser: 0.77 mg/dL (ref 0.50–1.10)
GFR calc Af Amer: >90 mL/min (ref >90)
Glucose, Bld: 107 mg/dL — ABNORMAL HIGH (ref 70–99)
Potassium: 4.1 mEq/L (ref 3.7–5.3)
SODIUM: 143 meq/L (ref 137–147)

## 2013-12-11 LAB — POCT I-STAT TROPONIN I: TROPONIN I, POC: 0.01 ng/mL (ref 0.00–0.08)

## 2013-12-11 MED ORDER — IBUPROFEN 800 MG PO TABS: 800.0000 mg | ORAL_TABLET | Freq: Three times a day (TID) | ORAL | Status: DC | PRN

## 2013-12-11 MED ORDER — BENZONATATE 100 MG PO CAPS: 100.0000 mg | ORAL_CAPSULE | Freq: Three times a day (TID) | ORAL | Status: DC | PRN

## 2013-12-11 MED ORDER — HYDROCODONE-ACETAMINOPHEN 5-325 MG PO TABS
1.0000 | ORAL_TABLET | Freq: Once | ORAL | Status: AC
  Administered 2013-12-11: 1 via ORAL
  Filled 2013-12-11: qty 1

## 2013-12-11 MED ORDER — ALBUTEROL SULFATE HFA 108 (90 BASE) MCG/ACT IN AERS: 2.0000 | INHALATION_SPRAY | RESPIRATORY_TRACT | Status: DC | PRN

## 2013-12-11 NOTE — ED Notes (Signed)
Pt presents to department for evaluation of pain to L axilla radiating to chest. Onset this afternoon. 6/10 pain upon arrival. Describes as "tightness" sensation. Respirations unlabored. Pt is alert and oriented x4. No signs of acute distress.

## 2013-12-11 NOTE — ED Provider Notes (Signed)
CSN: [id number]     Arrival date & time 12/11/13  1703 History   First MD Initiated Contact with Patient 12/11/13 1832     Chief Complaint  Patient presents with  . Chest Pain   HPI  48 y/o female who presents with cc of chest pain. She has had a cough, body aches and congestion since Sunday. Today she developed left sided chest pain. The pain is described as sharp and tight. It worsens with coughing and certain positioning. Pain is currently a 6/10. Her cough has been productive of sputum. She denies any shortness of breath.   Past Medical History  Diagnosis Date  . Thyroid disease    Past Surgical History  Procedure Laterality Date  . Thyroidectomy    . Partial hysterectomy     No family history on file. History  Substance Use Topics  . Smoking status: Current Every Day Smoker  . Smokeless tobacco: Not on file  . Alcohol Use: Yes     Comment: occassional   OB History   Grav Para Term Preterm Abortions TAB SAB Ect Mult Living                 Review of Systems  Constitutional: Negative for fever and chills.  HENT: Positive for congestion.   Respiratory: Positive for cough. Negative for shortness of breath.   Cardiovascular: Positive for chest pain. Negative for leg swelling.  Gastrointestinal: Negative for nausea, vomiting and abdominal pain.  Genitourinary: Negative for dysuria and frequency.  All other systems reviewed and are negative.    Allergies  Sulfa antibiotics  Home Medications   Current Outpatient Rx  Name  Route  Sig  Dispense  Refill  . levothyroxine (SYNTHROID, LEVOTHROID) 125 MCG tablet   Oral   Take 125 mcg by mouth daily before breakfast.         . naproxen sodium (ANAPROX) 220 MG tablet   Oral   Take 440 mg by mouth daily as needed (for pain).         Marland Kitchen albuterol (PROVENTIL HFA;VENTOLIN HFA) 108 (90 BASE) MCG/ACT inhaler   Inhalation   Inhale 2 puffs into the lungs every 4 (four) hours as needed for wheezing or shortness of breath.  1 Inhaler   0   . benzonatate (TESSALON) 100 MG capsule   Oral   Take 1 capsule (100 mg total) by mouth 3 (three) times daily as needed for cough.   21 capsule   0   . ibuprofen (ADVIL,MOTRIN) 800 MG tablet   Oral   Take 1 tablet (800 mg total) by mouth every 8 (eight) hours as needed.   30 tablet   0    BP 112/80  Pulse 67  Temp(Src) 98.5 F (36.9 C) (Oral)  Resp 19  Ht 5\' 8"  (1.727 m)  Wt 200 lb (90.719 kg)  BMI 30.42 kg/m2  SpO2 97% Physical Exam  Nursing note and vitals reviewed. Constitutional: She is oriented to person, place, and time. She appears well-developed and well-nourished. No distress.  HENT:  Head: Normocephalic and atraumatic.  Eyes: Conjunctivae are normal. Pupils are equal, round, and reactive to light.  Neck: Normal range of motion. Neck supple.  Cardiovascular: Normal rate and regular rhythm.  Exam reveals no gallop and no friction rub.   No murmur heard. Pulmonary/Chest: Effort normal and breath sounds normal. She exhibits bony tenderness.    Abdominal: Soft. She exhibits no distension. There is no tenderness.  Musculoskeletal: Normal range of motion.  She exhibits no edema and no tenderness.  Neurological: She is alert and oriented to person, place, and time. She has normal strength and normal reflexes. No cranial nerve deficit or sensory deficit.  Skin: Skin is warm and dry.  Psychiatric: She has a normal mood and affect.    ED Course  Procedures (including critical care time) Labs Review Labs Reviewed  CBC WITH DIFFERENTIAL - Abnormal; Notable for the following:    RBC 5.27 (*)    MCV 74.2 (*)    MCH 25.0 (*)    All other components within normal limits  BASIC METABOLIC PANEL - Abnormal; Notable for the following:    Glucose, Bld 107 (*)    Calcium 8.2 (*)    All other components within normal limits  POCT I-STAT TROPONIN I   Imaging Review Dg Chest 2 View  12/11/2013   CLINICAL DATA:  Chest pain, cough and congestion.  EXAM: CHEST   2 VIEW  COMPARISON:  PA and lateral chest 01/03/2010.  FINDINGS: Lungs are clear. Heart size is normal. No pneumothorax or pleural effusion. Two intact median sternotomy wires are again seen.  IMPRESSION: No acute disease.  Stable compared to prior exam.   Electronically Signed   By: Inge Rise M.D.   On: 12/11/2013 18:13    EKG Interpretation   None       MDM   Pain is reproducible. EKG without acute changes. Troponin negative. Low risk for DVT/PE and meets PERC criteria. Doubt PE. CXR clear. Doubt dissection, pneumonia or pneumothorax. Doubt ACS. Likely influenza like illness with chest wall pain. Out of window for Tamiflu. The patient was discharged home with an albuterol inhaler, tessalon pearls and ibuprofen. Return precautions given and discussed with the patient who was in agreement with the plan.    1. Chest pain   2. Influenza-like illness       Donita Brooks, MD 12/11/13 [phone]

## 2013-12-15 NOTE — ED Provider Notes (Signed)
I saw and evaluated the patient, reviewed the resident's note and I agree with the findings and plan.  EKG Interpretation    Date/Time:  Tuesday December 11 2013 17:14:16 EST Ventricular Rate:  88 PR Interval:  170 QRS Duration: 90 QT Interval:  394 QTC Calculation: 476 R Axis:   -84 Text Interpretation:  Normal sinus rhythm with sinus arrhythmia Left anterior fascicular block Possible Anterior infarct , age undetermined Abnormal ECG ED PHYSICIAN INTERPRETATION AVAILABLE IN [facility] Confirmed by TEST, RECORD ([id number]) on 12/13/2013 11:58:00 AM              Tanna Furry, MD 12/15/13 [phone]

## 2014-01-21 ENCOUNTER — Inpatient Hospital Stay: Payer: BC Managed Care – PPO | Admitting: Internal Medicine

## 2014-01-28 ENCOUNTER — Encounter: Payer: Self-pay | Admitting: Internal Medicine

## 2014-01-28 ENCOUNTER — Telehealth: Payer: Self-pay | Admitting: *Deleted

## 2014-01-28 ENCOUNTER — Ambulatory Visit: Payer: BC Managed Care – PPO | Attending: Internal Medicine | Admitting: Internal Medicine

## 2014-01-28 VITALS — BP 141/91 | HR 67 | Temp 98.8°F | Resp 18 | Ht 68.0 in | Wt 210.0 lb

## 2014-01-28 DIAGNOSIS — Z0000 Encounter for general adult medical examination without abnormal findings: Secondary | ICD-10-CM

## 2014-01-28 DIAGNOSIS — R21 Rash and other nonspecific skin eruption: Secondary | ICD-10-CM | POA: Insufficient documentation

## 2014-01-28 DIAGNOSIS — I10 Essential (primary) hypertension: Secondary | ICD-10-CM | POA: Insufficient documentation

## 2014-01-28 DIAGNOSIS — F17200 Nicotine dependence, unspecified, uncomplicated: Secondary | ICD-10-CM | POA: Insufficient documentation

## 2014-01-28 DIAGNOSIS — R509 Fever, unspecified: Secondary | ICD-10-CM | POA: Insufficient documentation

## 2014-01-28 DIAGNOSIS — E890 Postprocedural hypothyroidism: Secondary | ICD-10-CM | POA: Insufficient documentation

## 2014-01-28 MED ORDER — LEVOTHYROXINE SODIUM 125 MCG PO TABS: 125.0000 ug | ORAL_TABLET | Freq: Every day | ORAL | Status: DC

## 2014-01-28 MED ORDER — CLOBETASOL PROPIONATE 0.05 % EX CREA: 1.0000 "application " | TOPICAL_CREAM | Freq: Two times a day (BID) | CUTANEOUS | Status: DC

## 2014-01-28 NOTE — Progress Notes (Signed)
Pt is here to establish care Hx Thyroid disease. Taking Levothyroxine 125 mcg daily Need cream Eczema,dry patchy scabbed areas to left lower/upper f/a Refuses flu vaccine

## 2014-01-28 NOTE — Telephone Encounter (Signed)
Made the mistake of discharging the patient before her visit with Dr. Allyson Sabal was completed. Tried contacting the patient to explain the miscommunication. Left a voicemail for the patient to return our call ASAP.

## 2014-01-28 NOTE — Progress Notes (Signed)
Patient ID: Meagan Flores, female   DOB: November 11, 1966, 48 y.o.   MRN: [id number]   CC:  HPI: For atrial fever here to establish care. The patient has a history of a rash on her left forearm for the last 7-8 months. She has tried multiple steroid creams on them a high strength. No relief. She also has a history of hypothyroidism, thymus removal and thyroid surgery in the past. She she states that she has"tonsillar stones" and has been seen by [facility] the nose and throat and was told that she's not a candidate for surgery. She denies any difficulty swallowing, any fever or sore throat. She denies any cough. Blood pressure is borderline elevated but she has no history of hypertension   Social history smokes 4-5 cigarettes a day, denies alcohol use  Family history reviewed and found to be negative     Allergies  Allergen Reactions  . Sulfa Antibiotics Hives   Past Medical History  Diagnosis Date  . Thyroid disease    Current Outpatient Prescriptions on File Prior to Visit  Medication Sig Dispense Refill  . levothyroxine (SYNTHROID, LEVOTHROID) 125 MCG tablet Take 125 mcg by mouth daily before breakfast.      . albuterol (PROVENTIL HFA;VENTOLIN HFA) 108 (90 BASE) MCG/ACT inhaler Inhale 2 puffs into the lungs every 4 (four) hours as needed for wheezing or shortness of breath.  1 Inhaler  0  . benzonatate (TESSALON) 100 MG capsule Take 1 capsule (100 mg total) by mouth 3 (three) times daily as needed for cough.  21 capsule  0  . ibuprofen (ADVIL,MOTRIN) 800 MG tablet Take 1 tablet (800 mg total) by mouth every 8 (eight) hours as needed.  30 tablet  0  . naproxen sodium (ANAPROX) 220 MG tablet Take 440 mg by mouth daily as needed (for pain).       No current facility-administered medications on file prior to visit.   History reviewed. No pertinent family history. History   Social History  . Marital Status: Married    Spouse Name: N/A    Number of Children: N/A  . Years of Education:  N/A   Occupational History  . Not on file.   Social History Main Topics  . Smoking status: Current Every Day Smoker  . Smokeless tobacco: Not on file  . Alcohol Use: Yes     Comment: occassional  . Drug Use: No  . Sexual Activity: Not on file   Other Topics Concern  . Not on file   Social History Narrative  . No narrative on file    Review of Systems  Constitutional: As in history of present illness HENT: Negative for ear pain, nosebleeds, congestion, facial swelling, rhinorrhea, neck pain, neck stiffness and ear discharge.   Eyes: Negative for pain, discharge, redness, itching and visual disturbance.  Respiratory: Negative for cough, choking, chest tightness, shortness of breath, wheezing and stridor.   Cardiovascular: Negative for chest pain, palpitations and leg swelling.  Gastrointestinal: Negative for abdominal distention.  Genitourinary: Negative for dysuria, urgency, frequency, hematuria, flank pain, decreased urine volume, difficulty urinating and dyspareunia.  Musculoskeletal: Negative for back pain, joint swelling, arthralgias and gait problem.  Neurological: Negative for dizziness, tremors, seizures, syncope, facial asymmetry, speech difficulty, weakness, light-headedness, numbness and headaches.  Hematological: Negative for adenopathy. Does not bruise/bleed easily.  Psychiatric/Behavioral: Negative for hallucinations, behavioral problems, confusion, dysphoric mood, decreased concentration and agitation.    Objective:   Filed Vitals:   01/28/14 1656  BP: 141/91  Pulse: 67  Temp: 98.8 F (37.1 C)  Resp: 18    Physical Exam  Constitutional: Appears well-developed and well-nourished. No distress.  HENT: Normocephalic. External right and left ear normal. Oropharynx is clear and moist.  Eyes: Conjunctivae and EOM are normal. PERRLA, no scleral icterus.  Neck: Normal ROM. Neck supple. No JVD. No tracheal deviation. No thyromegaly.  CVS: RRR, S1/S2 +, no murmurs,  no gallops, no carotid bruit.  Pulmonary: Effort and breath sounds normal, no stridor, rhonchi, wheezes, rales.  Abdominal: Soft. BS +,  no distension, tenderness, rebound or guarding.  Musculoskeletal: Normal range of motion. No edema and no tenderness.  Lymphadenopathy: No lymphadenopathy noted, cervical, inguinal. Neuro: Alert. Normal reflexes, muscle tone coordination. No cranial nerve deficit. Skin: Skin is warm and dry. No rash noted. Not diaphoretic. No erythema. No pallor.  Psychiatric: Normal mood and affect. Behavior, judgment, thought content normal.   Lab Results  Component Value Date   WBC 7.2 12/11/2013   HGB 13.2 12/11/2013   HCT 39.1 12/11/2013   MCV 74.2* 12/11/2013   PLT 216 12/11/2013   Lab Results  Component Value Date   CREATININE 0.77 12/11/2013   BUN 13 12/11/2013   NA 143 12/11/2013   K 4.1 12/11/2013   CL 104 12/11/2013   CO2 26 12/11/2013    No results found for this basename: HGBA1C   Lipid Panel     Component Value Date/Time   CHOL  Value: 124        ATP III CLASSIFICATION:  <200     mg/dL   Desirable  200-239  mg/dL   Borderline High  >=240    mg/dL   High 03/12/2008 0347   TRIG 82 03/12/2008 0347   HDL 28* 03/12/2008 0347   CHOLHDL 4.4 03/12/2008 0347   VLDL 16 03/12/2008 0347   LDLCALC  Value: 80        Total Cholesterol/HDL:CHD Risk Coronary Heart Disease Risk Table                     Men   Women  1/2 Average Risk   3.4   3.3 03/12/2008 0347       Assessment and plan:   There are no active problems to display for this patient.  Hypothyroidism Status post thyroidectomy Check TSH and free T4 Synthroid refilled  Hypertension Monitor for now the blood pressure was not significantly elevated   Establish care Scheduled patient for a Pap smear Mammogram Baseline labs ENT referral for tonsillar stones Tetanus vaccination when available    Follow up in 3 months      The patient was given clear instructions to go to ER or return to medical  center if symptoms don't improve, worsen or new problems develop. The patient verbalized understanding. The patient was told to call to get any lab results if not heard anything in the next week.

## 2014-01-29 ENCOUNTER — Ambulatory Visit: Payer: BC Managed Care – PPO | Attending: Internal Medicine

## 2014-01-29 ENCOUNTER — Telehealth: Payer: Self-pay | Admitting: *Deleted

## 2014-01-29 LAB — CBC WITH DIFFERENTIAL/PLATELET
BASOS ABS: 0 10*3/uL (ref 0.0–0.1)
Basophils Relative: 0 % (ref 0–1)
Eosinophils Absolute: 0.1 10*3/uL (ref 0.0–0.7)
Eosinophils Relative: 1 % (ref 0–5)
HEMATOCRIT: 39.2 % (ref 36.0–46.0)
Hemoglobin: 12.5 g/dL (ref 12.0–15.0)
LYMPHS PCT: 41 % (ref 12–46)
Lymphs Abs: 2.6 10*3/uL (ref 0.7–4.0)
MCH: 23.9 pg — ABNORMAL LOW (ref 26.0–34.0)
MCHC: 31.9 g/dL (ref 30.0–36.0)
MCV: 74.8 fL — ABNORMAL LOW (ref 78.0–100.0)
MONO ABS: 0.4 10*3/uL (ref 0.1–1.0)
Monocytes Relative: 7 % (ref 3–12)
NEUTROS ABS: 3.3 10*3/uL (ref 1.7–7.7)
Neutrophils Relative %: 51 % (ref 43–77)
Platelets: 261 10*3/uL (ref 150–400)
RBC: 5.24 MIL/uL — ABNORMAL HIGH (ref 3.87–5.11)
RDW: 15.5 % (ref 11.5–15.5)
WBC: 6.4 10*3/uL (ref 4.0–10.5)

## 2014-01-29 LAB — LIPID PANEL
CHOLESTEROL: 194 mg/dL (ref 0–200)
HDL: 51 mg/dL (ref >39)
LDL CALC: 125 mg/dL — AB (ref 0–99)
TRIGLYCERIDES: 90 mg/dL (ref <150)
Total CHOL/HDL Ratio: 3.8 Ratio
VLDL: 18 mg/dL (ref 0–40)

## 2014-01-29 LAB — COMPLETE METABOLIC PANEL WITH GFR
ALBUMIN: 4.5 g/dL (ref 3.5–5.2)
ALK PHOS: 76 U/L (ref 39–117)
ALT: 9 U/L (ref 0–35)
AST: 13 U/L (ref 0–37)
BILIRUBIN TOTAL: 0.3 mg/dL (ref 0.2–1.2)
BUN: 8 mg/dL (ref 6–23)
CO2: 31 mEq/L (ref 19–32)
Calcium: 8.6 mg/dL (ref 8.4–10.5)
Chloride: 102 mEq/L (ref 96–112)
Creat: 0.69 mg/dL (ref 0.50–1.10)
GFR, Est African American: >89 mL/min
GLUCOSE: 120 mg/dL — AB (ref 70–99)
POTASSIUM: 3.5 meq/L (ref 3.5–5.3)
Sodium: 139 mEq/L (ref 135–145)
Total Protein: 7.2 g/dL (ref 6.0–8.3)

## 2014-01-29 LAB — TSH: TSH: 1.486 u[IU]/mL (ref 0.350–4.500)

## 2014-01-29 LAB — T4, FREE: Free T4: 1.31 ng/dL (ref 0.80–1.80)

## 2014-01-29 NOTE — Telephone Encounter (Signed)
Contacted the patient to have her return to the clinic asap to see Dr. Allyson Sabal. Patient has lab orders that are incomplete. Left a voicemail for patient to return our call.

## 2014-01-30 LAB — VITAMIN D 25 HYDROXY (VIT D DEFICIENCY, FRACTURES): Vit D, 25-Hydroxy: 16 ng/mL — ABNORMAL LOW (ref 30–89)

## 2014-02-01 ENCOUNTER — Telehealth: Payer: Self-pay | Admitting: *Deleted

## 2014-02-01 MED ORDER — VITAMIN D (ERGOCALCIFEROL) 1.25 MG (50000 UNIT) PO CAPS: 50000.0000 [IU] | ORAL_CAPSULE | ORAL | Status: DC

## 2014-02-01 NOTE — Telephone Encounter (Signed)
Left patient a voicemail to return or call.

## 2014-02-01 NOTE — Telephone Encounter (Signed)
Message copied by Shavonte Zhao, [place] R on Fri Feb 01, 2014  2:59 PM ------      Message from: Allyson Sabal MD, [facility]enc[facility] Dike      Created: Fri Feb 01, 2014 10:00 AM       Notify patient vitamin D level is low, his dose vitamin D 50,000 units weekly, 12 tablets, 0 refills ------

## 2014-02-04 ENCOUNTER — Telehealth: Payer: Self-pay | Admitting: *Deleted

## 2014-02-04 MED ORDER — VITAMIN D (ERGOCALCIFEROL) 1.25 MG (50000 UNIT) PO CAPS: 50000.0000 [IU] | ORAL_CAPSULE | ORAL | Status: DC

## 2014-02-04 NOTE — Telephone Encounter (Signed)
Patient returned a telephone call after a voicemail was left for her. Notified patient vitamin D level is low, his dose vitamin D 50,000 units weekly, 12 tablets, 0 refills. Medication was resent to [facility] instead of [facility]. Instructed patient on how to take the medication. Patient accepted instructions. Call completed as followed.

## 2014-02-11 ENCOUNTER — Other Ambulatory Visit: Payer: Self-pay | Admitting: Internal Medicine

## 2014-02-11 DIAGNOSIS — Z1231 Encounter for screening mammogram for malignant neoplasm of breast: Secondary | ICD-10-CM

## 2014-03-01 ENCOUNTER — Ambulatory Visit: Payer: BC Managed Care – PPO

## 2014-03-08 ENCOUNTER — Encounter: Payer: BC Managed Care – PPO | Admitting: Medical

## 2014-03-08 ENCOUNTER — Ambulatory Visit
Admission: RE | Admit: 2014-03-08 | Discharge: 2014-03-08 | Disposition: A | Discharge status: Home / Self Care | Payer: BC Managed Care – PPO | Source: Ambulatory Visit | Attending: Internal Medicine | Admitting: Internal Medicine

## 2014-03-08 DIAGNOSIS — Z1231 Encounter for screening mammogram for malignant neoplasm of breast: Secondary | ICD-10-CM

## 2014-04-03 ENCOUNTER — Encounter: Payer: Self-pay | Admitting: Medical

## 2014-04-03 ENCOUNTER — Ambulatory Visit (INDEPENDENT_AMBULATORY_CARE_PROVIDER_SITE_OTHER): Payer: BC Managed Care – PPO | Admitting: Medical

## 2014-04-03 VITALS — BP 120/81 | HR 84 | Temp 98.5°F | Ht 68.0 in | Wt 210.1 lb

## 2014-04-03 DIAGNOSIS — Z113 Encounter for screening for infections with a predominantly sexual mode of transmission: Secondary | ICD-10-CM

## 2014-04-03 DIAGNOSIS — N951 Menopausal and female climacteric states: Secondary | ICD-10-CM

## 2014-04-03 MED ORDER — GABAPENTIN 100 MG PO CAPS: 100.0000 mg | ORAL_CAPSULE | Freq: Every day | ORAL | Status: DC

## 2014-04-03 NOTE — Patient Instructions (Signed)
Menopause Menopause is the normal time of life when menstrual periods stop completely. Menopause is complete when you have missed 12 consecutive menstrual periods. It usually occurs between the ages of 8 years and 48 years. Very rarely does a woman develop menopause before the age of 60 years. At menopause, your ovaries stop producing the female hormones estrogen and progesterone. This can cause undesirable symptoms and also affect your health. Sometimes the symptoms may occur 4 5 years before the menopause begins. There is no relationship between menopause and:  Oral contraceptives.  Number of children you had.  Race.  The age your menstrual periods started (menarche). Heavy smokers and very thin women may develop menopause earlier in life. CAUSES  The ovaries stop producing the female hormones estrogen and progesterone.  Other causes include:  Surgery to remove both ovaries.  The ovaries stop functioning for no known reason.  Tumors of the pituitary gland in the brain.  Medical disease that affects the ovaries and hormone production.  Radiation treatment to the abdomen or pelvis.  Chemotherapy that affects the ovaries. SYMPTOMS   Hot flashes.  Night sweats.  Decrease in sex drive.  Vaginal dryness and thinning of the vagina causing painful intercourse.  Dryness of the skin and developing wrinkles.  Headaches.  Tiredness.  Irritability.  Memory problems.  Weight gain.  Bladder infections.  Hair growth of the face and chest.  Infertility. More serious symptoms include:  Loss of bone (osteoporosis) causing breaks (fractures).  Depression.  Hardening and narrowing of the arteries (atherosclerosis) causing heart attacks and strokes. DIAGNOSIS   When the menstrual periods have stopped for 12 straight months.  Physical exam.  Hormone studies of the blood. TREATMENT  There are many treatment choices and nearly as many questions about them. The  decisions to treat or not to treat menopausal changes is an individual choice made with your health care provider. Your health care provider can discuss the treatments with you. Together, you can decide which treatment will work best for you. Your treatment choices may include:   Hormone therapy (estrogen and progesterone).  Non-hormonal medicines.  Treating the individual symptoms with medicine (for example antidepressants for depression).  Herbal medicines that may help specific symptoms.  Counseling by a psychiatrist or psychologist.  Group therapy.  Lifestyle changes including:  Eating healthy.  Regular exercise.  Limiting caffeine and alcohol.  Stress management and meditation.  No treatment. HOME CARE INSTRUCTIONS   Take the medicine your health care provider gives you as directed.  Get plenty of sleep and rest.  Exercise regularly.  Eat a diet that contains calcium (good for the bones) and soy products (acts like estrogen hormone).  Avoid alcoholic beverages.  Do not smoke.  If you have hot flashes, dress in layers.  Take supplements, calcium, and vitamin D to strengthen bones.  You can use over-the-counter lubricants or moisturizers for vaginal dryness.  Group therapy is sometimes very helpful.  Acupuncture may be helpful in some cases. SEEK MEDICAL CARE IF:   You are not sure you are in menopause.  You are having menopausal symptoms and need advice and treatment.  You are still having menstrual periods after age 71 years.  You have pain with intercourse.  Menopause is complete (no menstrual period for 12 months) and you develop vaginal bleeding.  You need a referral to a specialist (gynecologist, psychiatrist, or psychologist) for treatment. SEEK IMMEDIATE MEDICAL CARE IF:   You have severe depression.  You have excessive vaginal bleeding.  You fell and think you have a broken bone.  You have pain when you urinate.  You develop leg or  chest pain.  You have a fast pounding heart beat (palpitations).  You have severe headaches.  You develop vision problems.  You feel a lump in your breast.  You have abdominal pain or severe indigestion. Document Released: 02/05/2004 Document Revised: 07/18/2013 Document Reviewed: 06/14/2013 [facility] Patient Information 2014 [place], [place].

## 2014-04-03 NOTE — Progress Notes (Deleted)
Patient ID: Meagan Flores, female   DOB: 11/21/1966, 48 y.o.   MRN: [id number]  History:  [name] is a 48 y.o. [id number] who presents to clinic today for ***   The following portions of the patient's history were reviewed and updated as appropriate: allergies, current medications, past family history, past medical history, past social history, past surgical history and problem list.  Review of Systems:  {Ros - complete:30496}  Objective:  Physical Exam BP 120/81  Pulse 84  Temp(Src) 98.5 F (36.9 C) (Oral)  Ht 5\' 8"  (1.727 m)  Wt 210 lb 1.6 oz (95.301 kg)  BMI 31.95 kg/m2 GENERAL: Well-developed, well-nourished female in no acute distress.  HEENT: Normocephalic, atraumatic.  NECK: Supple. Normal thyroid.  LUNGS: Normal rate. Clear to auscultation bilaterally.  HEART: Regular rate and rhythm with no adventitious sounds.  BREASTS: Symmetric in size. No masses, skin changes, nipple drainage, or lymphadenopathy. ABDOMEN: Soft, nontender, nondistended. No organomegaly. Normal bowel sounds appreciated in all quadrants.  PELVIC: Normal external female genitalia. Vagina is pink and rugated.  Normal discharge. Normal cervix contour. Pap smear obtained. Uterus is normal in size. No adnexal mass or tenderness.  EXTREMITIES: No cyanosis, clubbing, or edema, 2+ distal pulses.   Labs and Imaging Mm Digital Screening Bilateral  03/11/2014   CLINICAL DATA:  Screening.  EXAM: DIGITAL SCREENING BILATERAL MAMMOGRAM WITH CAD  COMPARISON:  None.  ACR Breast Density Category c: The breast tissue is heterogeneously dense, which may obscure small masses  FINDINGS: There are no findings suspicious for malignancy. Images were processed with CAD.  IMPRESSION: No mammographic evidence of malignancy. A result letter of this screening mammogram will be mailed directly to the patient.  RECOMMENDATION: Screening mammogram in one year. (Code:SM-B-01Y)  BI-RADS CATEGORY  1: Negative.   Electronically Signed   By:  Abelardo Diesel M.D.   On: 03/11/2014 10:56    Assessment & Plan:  Assessment: ***  Plans: ***  Farris Has, PA-C 04/03/2014 3:02 PM

## 2014-04-03 NOTE — Progress Notes (Signed)
Patient ID: Meagan Flores, female   DOB: 1966-11-14, 49 y.o.   MRN: [id number] Subjective:    Meagan Flores is a 48 y.o. female who presents for annual exam. The patient complains of hot flashes. The patient is sexually active. GYN screening history: last pap: was normal. Patient unsure of when last pap smear was done. The patient is not taking hormone replacement therapy. Patient denies post-menopausal vaginal bleeding.. The patient wears seatbelts: yes. The patient participates in regular exercise: no. Has the patient ever been transfused or tattooed?: yes. Blood transfusion with chest surgery in 2002. The patient reports that there is not domestic violence in her life. Patient had mammogram this year.    Menstrual History: OB History   Grav Para Term Preterm Abortions TAB SAB Ect Mult Living   3 3 3  0 0 0 0 0 0 17      Menarche age: 46 yeats old  No LMP recorded. Patient has had a hysterectomy. Hysterectomy at 48 years old for AUB.     The following portions of the patient's history were reviewed and updated as appropriate: allergies, current medications, past family history, past medical history, past social history, past surgical history and problem list.  Review of Systems Pertinent items are noted in HPI.    Objective:   BP 120/81  Pulse 84  Temp(Src) 98.5 F (36.9 C) (Oral)  Ht 5\' 8"  (1.727 m)  Wt 210 lb 1.6 oz (95.301 kg)  BMI 31.95 kg/m2 GENERAL: Well-developed, well-nourished female in no acute distress.  HEENT: Normocephalic, atraumatic.  NECK: Supple. Normal thyroid.  LUNGS: Clear to auscultation bilaterally.  HEART: Regular rate and rhythm. BREASTS: Symmetric in size. No masses, skin changes, nipple drainage, or lymphadenopathy. ABDOMEN: Soft, nontender, nondistended. No organomegaly. PELVIC: Normal external female genitalia. Vagina is pink and rugated.  Normal discharge. Cervix is surgically absent. Wet prep and GC/Chlamydia obtained. Uterus is surgically absent.  No  adnexal mass or tenderness.  EXTREMITIES: No cyanosis, clubbing, or edema  Assessment:    Normal gyn exam Menopause  Hot flashes   Plan:    Wet prep and GC/Chlamydia pending  Patient will be contacted with any abnormal results Rx for Gabapentin 100 mg qhs sent to patient's pharmacy Patient to follow-up with [facility] in 4-6 months for re-evaluation of menopausal symptom management or sooner PRN  Farris Has, PA-C 04/03/2014 3:28 PM

## 2014-04-04 LAB — WET PREP, GENITAL
CLUE CELLS WET PREP: NONE SEEN
Trich, Wet Prep: NONE SEEN
WBC, Wet Prep HPF POC: NONE SEEN
YEAST WET PREP: NONE SEEN

## 2014-04-04 LAB — GC/CHLAMYDIA PROBE AMP
CT PROBE, AMP APTIMA: NEGATIVE
GC Probe RNA: NEGATIVE

## 2014-04-05 ENCOUNTER — Ambulatory Visit: Payer: BC Managed Care – PPO | Admitting: Internal Medicine

## 2014-04-17 ENCOUNTER — Encounter: Payer: Self-pay | Admitting: Internal Medicine

## 2014-05-01 ENCOUNTER — Ambulatory Visit: Payer: BC Managed Care – PPO | Admitting: Internal Medicine

## 2014-06-09 ENCOUNTER — Emergency Department (HOSPITAL_COMMUNITY)
Admission: EM | Admit: 2014-06-09 | Discharge: 2014-06-09 | Disposition: A | Discharge status: Home / Self Care | Payer: BC Managed Care – PPO | Attending: Emergency Medicine | Admitting: Emergency Medicine

## 2014-06-09 ENCOUNTER — Encounter (HOSPITAL_COMMUNITY): Payer: Self-pay | Admitting: Emergency Medicine

## 2014-06-09 DIAGNOSIS — E079 Disorder of thyroid, unspecified: Secondary | ICD-10-CM | POA: Insufficient documentation

## 2014-06-09 DIAGNOSIS — Z79899 Other long term (current) drug therapy: Secondary | ICD-10-CM | POA: Insufficient documentation

## 2014-06-09 DIAGNOSIS — M79609 Pain in unspecified limb: Secondary | ICD-10-CM | POA: Insufficient documentation

## 2014-06-09 DIAGNOSIS — R609 Edema, unspecified: Secondary | ICD-10-CM | POA: Insufficient documentation

## 2014-06-09 DIAGNOSIS — M79675 Pain in left toe(s): Secondary | ICD-10-CM

## 2014-06-09 DIAGNOSIS — F17200 Nicotine dependence, unspecified, uncomplicated: Secondary | ICD-10-CM | POA: Insufficient documentation

## 2014-06-09 MED ORDER — HYDROCODONE-ACETAMINOPHEN 5-325 MG PO TABS: 1.0000 | ORAL_TABLET | ORAL | Status: DC | PRN

## 2014-06-09 MED ORDER — PREDNISONE 20 MG PO TABS: 40.0000 mg | ORAL_TABLET | Freq: Every day | ORAL | Status: DC

## 2014-06-09 NOTE — ED Notes (Signed)
Declined W/C at D/C and was escorted to lobby by RN. 

## 2014-06-09 NOTE — ED Provider Notes (Signed)
CSN: [id number]     Arrival date & time 06/09/14  3664 History  This chart was scribed for non-physician practitioner, Quincy Carnes, PA-C,working with Tanna Furry, MD, by Marlowe Kays, ED Scribe.  This patient was seen in room TR10C/TR10C and the patient's care was started at 9:37 AM.   Chief Complaint  Patient presents with  . Toe Pain   The history is provided by the patient. No language interpreter was used.   HPI Comments:  Meagan Flores is a 48 y.o. obese female who presents to the Emergency Department complaining of moderate left foot pain onset three days. She states the pain is mainly in her great toe. Pt reports associated swelling. She states the pain increases with walking. She has been elevating the foot. Pt denies numbness. She denies injury, trauma, or fall. She denies past surgeries or injuries to the foot. She denies h/o gout. Pt is allergic to sulfa medications.  Past Medical History  Diagnosis Date  . Thyroid disease    Past Surgical History  Procedure Laterality Date  . Thyroidectomy    . Partial hysterectomy     No family history on file. History  Substance Use Topics  . Smoking status: Current Every Day Smoker  . Smokeless tobacco: Not on file  . Alcohol Use: Yes     Comment: occassional   OB History   Grav Para Term Preterm Abortions TAB SAB Ect Mult Living   3 3 3  0 0 0 0 0 0 3     Review of Systems  Musculoskeletal: Positive for arthralgias and joint swelling.  All other systems reviewed and are negative.   Allergies  Sulfa antibiotics  Home Medications   Prior to Admission medications   Medication Sig Start Date End Date Taking? Authorizing Provider  gabapentin (NEURONTIN) 100 MG capsule Take 1 capsule (100 mg total) by mouth at bedtime. 04/03/14   Farris Has, PA-C  ibuprofen (ADVIL,MOTRIN) 800 MG tablet Take 1 tablet (800 mg total) by mouth every 8 (eight) hours as needed. 12/11/13   Michiel Cowboy, MD  levothyroxine (SYNTHROID,  LEVOTHROID) 125 MCG tablet Take 1 tablet (125 mcg total) by mouth daily before breakfast. 01/28/14   Reyne Dumas, MD  naproxen sodium (ANAPROX) 220 MG tablet Take 440 mg by mouth daily as needed (for pain).    Historical Provider, MD   Triage Vitals: BP 129/93  Pulse 67  Temp(Src) 98.1 F (36.7 C) (Oral)  Resp 18  SpO2 99% Physical Exam  Nursing note and vitals reviewed. Constitutional: She is oriented to person, place, and time. She appears well-developed and well-nourished. No distress.  HENT:  Head: Normocephalic and atraumatic.  Mouth/Throat: Oropharynx is clear and moist.  Eyes: Conjunctivae and EOM are normal. Pupils are equal, round, and reactive to light.  Neck: Normal range of motion.  Cardiovascular: Normal rate, regular rhythm and normal heart sounds.    DP pulse intact.  Pulmonary/Chest: Effort normal and breath sounds normal.  Musculoskeletal: Normal range of motion. She exhibits edema.  Some tenderness along left great toe MTP joint. Limited flexion and extension due to pain and swelling.  Neurological: She is alert and oriented to person, place, and time.  Sensation intact.  Skin: Skin is warm and dry. She is not diaphoretic. No erythema.  No erythema or warmth to touch.  Psychiatric: She has a normal mood and affect.    ED Course  Procedures (including critical care time) DIAGNOSTIC STUDIES: Oxygen Saturation is 99% on RA,  normal by my interpretation.   COORDINATION OF CARE: 9:39 AM- Advised pt to continue to elevate foot above heart. Will prescribe pain medication. Pt verbalizes understanding and agrees to plan.  Medications - No data to display  Labs Review Labs Reviewed - No data to display  Imaging Review No results found.   EKG Interpretation None      MDM   Final diagnoses:  Toe pain, left   Atraumatic left great toe pain. No signs or symptoms concerning for septic joint. Questionable early gout. Patient will be started on Vicodin and  prednisone. She is a surgically tenia elevating foot to help with swelling. She'll followup with her primary care physician.  Discussed plan with patient, he/she acknowledged understanding and agreed with plan of care.  Return precautions given for new or worsening symptoms.  I personally performed the services described in this documentation, which was scribed in my presence. The recorded information has been reviewed and is accurate.  Larene Pickett, PA-C 06/09/14 1008  Larene Pickett, PA-C 06/09/14 1008

## 2014-06-09 NOTE — ED Notes (Signed)
PT reports pain and swelling in Lt great toe since Thur.

## 2014-06-09 NOTE — Discharge Instructions (Signed)
Take the prescribed medication as directed.  Continue elevating foot to help relieve swelling. Follow-up with your primary care physician. Return to the ED for new or worsening symptoms.

## 2014-06-17 NOTE — ED Provider Notes (Signed)
Medical screening examination/treatment/procedure(s) were performed by non-physician practitioner and as supervising physician I was immediately available for consultation/collaboration.   EKG Interpretation None        Tanna Furry, MD 06/17/14 1511

## 2014-08-08 ENCOUNTER — Encounter (HOSPITAL_COMMUNITY): Payer: Self-pay | Admitting: Emergency Medicine

## 2014-08-08 ENCOUNTER — Emergency Department (HOSPITAL_COMMUNITY): Payer: BC Managed Care – PPO

## 2014-08-08 ENCOUNTER — Emergency Department (HOSPITAL_COMMUNITY)
Admission: EM | Admit: 2014-08-08 | Discharge: 2014-08-08 | Disposition: A | Discharge status: Home / Self Care | Payer: BC Managed Care – PPO | Attending: Emergency Medicine | Admitting: Emergency Medicine

## 2014-08-08 DIAGNOSIS — M25519 Pain in unspecified shoulder: Secondary | ICD-10-CM | POA: Diagnosis present

## 2014-08-08 DIAGNOSIS — F17200 Nicotine dependence, unspecified, uncomplicated: Secondary | ICD-10-CM | POA: Diagnosis not present

## 2014-08-08 DIAGNOSIS — M19019 Primary osteoarthritis, unspecified shoulder: Secondary | ICD-10-CM | POA: Diagnosis not present

## 2014-08-08 DIAGNOSIS — Z79899 Other long term (current) drug therapy: Secondary | ICD-10-CM | POA: Diagnosis not present

## 2014-08-08 DIAGNOSIS — E079 Disorder of thyroid, unspecified: Secondary | ICD-10-CM | POA: Insufficient documentation

## 2014-08-08 DIAGNOSIS — M19011 Primary osteoarthritis, right shoulder: Secondary | ICD-10-CM

## 2014-08-08 MED ORDER — METHOCARBAMOL 500 MG PO TABS: 500.0000 mg | ORAL_TABLET | Freq: Two times a day (BID) | ORAL | Status: DC

## 2014-08-08 MED ORDER — HYDROCODONE-ACETAMINOPHEN 5-325 MG PO TABS: ORAL_TABLET | ORAL | Status: DC

## 2014-08-08 MED ORDER — METHOCARBAMOL 500 MG PO TABS
1000.0000 mg | ORAL_TABLET | Freq: Once | ORAL | Status: AC
  Administered 2014-08-08: 1000 mg via ORAL
  Filled 2014-08-08: qty 2

## 2014-08-08 MED ORDER — HYDROCODONE-ACETAMINOPHEN 5-325 MG PO TABS
1.0000 | ORAL_TABLET | Freq: Once | ORAL | Status: AC
  Administered 2014-08-08: 1 via ORAL
  Filled 2014-08-08: qty 1

## 2014-08-08 NOTE — ED Notes (Addendum)
Pt arrives with R arm/shoulder pain that has gotten worse over the last couple of days, states she's had pain in extremity for about a month and has since gotten worse. Affecting mobility. Aleve/advil not effective. Not able to lift weights. CMS intact. Denies injury

## 2014-08-08 NOTE — Discharge Instructions (Signed)
Only use the arm sling for up to 2 days. Take the arm out and rotate the shoulder every 4 hours.   For pain control please take Ibuprofen (also known as Motrin or Advil) 400mg  (this is normally 2 over the counter pills) every 6 hours. Take with food to minimize stomach irritation.  For breakthrough pain you may take Robaxin, Valium. Do not drink alcohol, drive or operate heavy machinery when taking Robaxin, Valium.  Please follow with your primary care doctor in the next 2 days for a check-up. They must obtain records for further management.   Do not hesitate to return to the Emergency Department for any new, worsening or concerning symptoms.    Arthritis, Nonspecific Arthritis is inflammation of a joint. This usually means pain, redness, warmth or swelling are present. One or more joints may be involved. There are a number of types of arthritis. Your caregiver may not be able to tell what type of arthritis you have right away. CAUSES  The most common cause of arthritis is the wear and tear on the joint (osteoarthritis). This causes damage to the cartilage, which can break down over time. The knees, hips, back and neck are most often affected by this type of arthritis. Other types of arthritis and common causes of joint pain include:  Sprains and other injuries near the joint. Sometimes minor sprains and injuries cause pain and swelling that develop hours later.  Rheumatoid arthritis. This affects hands, feet and knees. It usually affects both sides of your body at the same time. It is often associated with chronic ailments, fever, weight loss and general weakness.  Crystal arthritis. Gout and pseudo gout can cause occasional acute severe pain, redness and swelling in the foot, ankle, or knee.  Infectious arthritis. Bacteria can get into a joint through a break in overlying skin. This can cause infection of the joint. Bacteria and viruses can also spread through the blood and affect your  joints.  Drug, infectious and allergy reactions. Sometimes joints can become mildly painful and slightly swollen with these types of illnesses. SYMPTOMS   Pain is the main symptom.  Your joint or joints can also be red, swollen and warm or hot to the touch.  You may have a fever with certain types of arthritis, or even feel overall ill.  The joint with arthritis will hurt with movement. Stiffness is present with some types of arthritis. DIAGNOSIS  Your caregiver will suspect arthritis based on your description of your symptoms and on your exam. Testing may be needed to find the type of arthritis:  Blood and sometimes urine tests.  X-ray tests and sometimes CT or MRI scans.  Removal of fluid from the joint (arthrocentesis) is done to check for bacteria, crystals or other causes. Your caregiver (or a specialist) will numb the area over the joint with a local anesthetic, and use a needle to remove joint fluid for examination. This procedure is only minimally uncomfortable.  Even with these tests, your caregiver may not be able to tell what kind of arthritis you have. Consultation with a specialist (rheumatologist) may be helpful. TREATMENT  Your caregiver will discuss with you treatment specific to your type of arthritis. If the specific type cannot be determined, then the following general recommendations may apply. Treatment of severe joint pain includes:  Rest.  Elevation.  Anti-inflammatory medication (for example, ibuprofen) may be prescribed. Avoiding activities that cause increased pain.  Only take over-the-counter or prescription medicines for pain and discomfort  as recommended by your caregiver.  Cold packs over an inflamed joint may be used for 10 to 15 minutes every hour. Hot packs sometimes feel better, but do not use overnight. Do not use hot packs if you are diabetic without your caregiver's permission.  A cortisone shot into arthritic joints may help reduce pain and  swelling.  Any acute arthritis that gets worse over the next 1 to 2 days needs to be looked at to be sure there is no joint infection. Long-term arthritis treatment involves modifying activities and lifestyle to reduce joint stress jarring. This can include weight loss. Also, exercise is needed to nourish the joint cartilage and remove waste. This helps keep the muscles around the joint strong. HOME CARE INSTRUCTIONS   Do not take aspirin to relieve pain if gout is suspected. This elevates uric acid levels.  Only take over-the-counter or prescription medicines for pain, discomfort or fever as directed by your caregiver.  Rest the joint as much as possible.  If your joint is swollen, keep it elevated.  Use crutches if the painful joint is in your leg.  Drinking plenty of fluids may help for certain types of arthritis.  Follow your caregiver's dietary instructions.  Try low-impact exercise such as:  Swimming.  Water aerobics.  Biking.  Walking.  Morning stiffness is often relieved by a warm shower.  Put your joints through regular range-of-motion. SEEK MEDICAL CARE IF:   You do not feel better in 24 hours or are getting worse.  You have side effects to medications, or are not getting better with treatment. SEEK IMMEDIATE MEDICAL CARE IF:   You have a fever.  You develop severe joint pain, swelling or redness.  Many joints are involved and become painful and swollen.  There is severe back pain and/or leg weakness.  You have loss of bowel or bladder control. Document Released: 12/23/2004 Document Revised: 02/07/2012 Document Reviewed: 01/08/2009 [facility] Patient Information 2015 [place], [place]. This information is not intended to replace advice given to you by your health care provider. Make sure you discuss any questions you have with your health care provider.

## 2014-08-08 NOTE — ED Provider Notes (Signed)
CSN: [id number]     Arrival date & time 08/08/14  5035 History   First MD Initiated Contact with Patient 08/08/14 [phone]     Chief Complaint  Patient presents with  . Arm Pain     (Consider location/radiation/quality/duration/timing/severity/associated sxs/prior Treatment) HPI  Meagan Flores is a 48 y.o. female complaining of atraumatic right right shoulder pain worsening in frequency and severity over the course of the month. Her pain is now 8/10. It is not alleviated by naproxen or Motrin. She's been taking 2 Aleve every 4 hours with little relief. 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 She has repetitive stress to the shoulder. Patient states that she cannot lift the arm. She denies any headache, change in vision, cervicalgia, dysarthria, ataxia, chest pain, shortness of breath, numbness, paresthesia. Patient had elbow surgery 15 years ago for ulnar nerve issues x15 yeares ago elbow surgery to releif   Past Medical History  Diagnosis Date  . Thyroid disease    Past Surgical History  Procedure Laterality Date  . Thyroidectomy    . Partial hysterectomy     No family history on file. History  Substance Use Topics  . Smoking status: Current Every Day Smoker -- 0.50 packs/day  . Smokeless tobacco: Not on file  . Alcohol Use: Yes     Comment: occassional   OB History   Grav Para Term Preterm Abortions TAB SAB Ect Mult Living   3 3 3  0 0 0 0 0 0 3     Review of Systems  10 systems reviewed and found to be negative, except as noted in the HPI.   Allergies  Sulfa antibiotics  Home Medications   Prior to Admission medications   Medication Sig Start Date End Date Taking? Authorizing Provider  levothyroxine (SYNTHROID, LEVOTHROID) 125 MCG tablet Take 1 tablet (125 mcg total) by mouth daily before breakfast. 01/28/14  Yes Reyne Dumas, MD  naproxen sodium (ANAPROX) 220 MG tablet Take 440 mg by mouth daily as needed (for pain).   Yes  Historical Provider, MD  HYDROcodone-acetaminophen (NORCO/VICODIN) 5-325 MG per tablet Take 1-2 tablets by mouth every 6 hours as needed for pain. 08/08/14   Emmit Oriley, PA-C  methocarbamol (ROBAXIN) 500 MG tablet Take 1 tablet (500 mg total) by mouth 2 (two) times daily. 08/08/14   Jorja Empie, PA-C   BP 119/88  Pulse 58  Temp(Src) 97.8 F (36.6 C) (Oral)  Resp 20  Ht 5\' 8"  (1.727 m)  Wt 215 lb (97.523 kg)  BMI 32.70 kg/m2  SpO2 100% Physical Exam  Nursing note and vitals reviewed. Constitutional: She is oriented to person, place, and time. She appears well-developed and well-nourished. No distress.  HENT:  Head: Normocephalic and atraumatic.  Mouth/Throat: Oropharynx is clear and moist.  Eyes: Conjunctivae and EOM are normal.  Neck: Neck supple. No JVD present.  Cardiovascular: Normal rate, regular rhythm and intact distal pulses.   Pulmonary/Chest: Effort normal and breath sounds normal. No stridor. No respiratory distress. She has no wheezes. She has no rales. She exhibits no tenderness.  Abdominal: Soft. There is no tenderness.  Musculoskeletal: Normal range of motion. She exhibits tenderness. She exhibits no edema.       Arms: Right shoulder with no deformity, patient has 5 out of 5 strength to grip strength, biceps and triceps. She is not able to abduct greater than 90. Drop arm is negative. She has no focal tenderness along the rotator  cuff.  Distally neurovascularly intact.  Neurological: She is alert and oriented to person, place, and time.  Psychiatric: She has a normal mood and affect.    ED Course  Procedures (including critical care time) Labs Review Labs Reviewed - No data to display  Imaging Review Dg Shoulder Right  08/08/2014   CLINICAL DATA:  Pain, job related.  EXAM: RIGHT SHOULDER - 2+ VIEW  COMPARISON:  Chest x-ray 07/18/2008.  FINDINGS: Mild acromioclavicular and glenohumeral degenerative change. Subacromial spurring. No evidence of fracture,  dislocation, or separation. Prior median sternotomy.  IMPRESSION: 1. Acromioclavicular and glenohumeral degenerative change. 2. Subacromial spurring. 3. Prior median sternotomy.   Electronically Signed   By: Marcello Moores  Register   On: 08/08/2014 07:08     EKG Interpretation None      MDM   Final diagnoses:  Arthritis of right shoulder region    Filed Vitals:   08/08/14 0645 08/08/14 0715 08/08/14 0730 08/08/14 0745  BP: 112/68 125/91 121/78 119/88  Pulse: 65 59 58 58  Temp:      TempSrc:      Resp:      Height:      Weight:      SpO2: 100% 100% 100% 100%    Medications  methocarbamol (ROBAXIN) tablet 1,000 mg (1,000 mg Oral Given 08/08/14 0713)  HYDROcodone-acetaminophen (NORCO/VICODIN) 5-325 MG per tablet 1 tablet (1 tablet Oral Given 08/08/14 0713)    Meagan Flores is a 48 y.o. female presenting with right shoulder arthralgia or worsening over the course of a month. 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 Patient's reduced range of motion and it cannot abduct greater than 90 secondary to pain. X-ray shows subacromial bone spur and arthritis. Patient will be given a sling, advised to try to arrange the arm every several hours to avoid frozen shoulder. I've advised her it is very important for her to follow with orthopedics as this may impact her job.  Evaluation does not show pathology that would require ongoing emergent intervention or inpatient treatment. Pt is hemodynamically stable and mentating appropriately. Discussed findings and plan with patient/guardian, who agrees with care plan. All questions answered. Return precautions discussed and outpatient follow up given.   New Prescriptions   HYDROCODONE-ACETAMINOPHEN (NORCO/VICODIN) 5-325 MG PER TABLET    Take 1-2 tablets by mouth every 6 hours as needed for pain.   METHOCARBAMOL (ROBAXIN) 500 MG TABLET    Take 1 tablet (500 mg total) by mouth 2 (two) times daily.          Monico Blitz, PA-C 08/08/14 [phone]

## 2014-08-12 NOTE — ED Provider Notes (Signed)
Medical screening examination/treatment/procedure(s) were performed by non-physician practitioner and as supervising physician I was immediately available for consultation/collaboration.   EKG Interpretation None        Meagan Siren III, MD 08/12/14 [id number]

## 2014-09-27 DIAGNOSIS — E890 Postprocedural hypothyroidism: Secondary | ICD-10-CM | POA: Insufficient documentation

## 2014-09-27 HISTORY — DX: Postprocedural hypothyroidism: E89.0

## 2014-09-30 ENCOUNTER — Encounter (HOSPITAL_COMMUNITY): Payer: Self-pay | Admitting: Emergency Medicine

## 2014-12-10 DIAGNOSIS — M25511 Pain in right shoulder: Secondary | ICD-10-CM | POA: Diagnosis not present

## 2014-12-10 DIAGNOSIS — M545 Low back pain: Secondary | ICD-10-CM | POA: Insufficient documentation

## 2014-12-10 DIAGNOSIS — E079 Disorder of thyroid, unspecified: Secondary | ICD-10-CM | POA: Diagnosis not present

## 2014-12-10 DIAGNOSIS — Z720 Tobacco use: Secondary | ICD-10-CM | POA: Insufficient documentation

## 2014-12-10 DIAGNOSIS — G8929 Other chronic pain: Secondary | ICD-10-CM | POA: Insufficient documentation

## 2014-12-10 DIAGNOSIS — Z79899 Other long term (current) drug therapy: Secondary | ICD-10-CM | POA: Insufficient documentation

## 2014-12-11 ENCOUNTER — Encounter (HOSPITAL_COMMUNITY): Payer: Self-pay | Admitting: Emergency Medicine

## 2014-12-11 ENCOUNTER — Emergency Department (HOSPITAL_COMMUNITY)
Admission: EM | Admit: 2014-12-11 | Discharge: 2014-12-11 | Disposition: A | Discharge status: Home / Self Care | Payer: BLUE CROSS/BLUE SHIELD | Attending: Emergency Medicine | Admitting: Emergency Medicine

## 2014-12-11 ENCOUNTER — Ambulatory Visit (HOSPITAL_COMMUNITY): Payer: BLUE CROSS/BLUE SHIELD

## 2014-12-11 DIAGNOSIS — M25511 Pain in right shoulder: Secondary | ICD-10-CM

## 2014-12-11 DIAGNOSIS — M545 Low back pain: Secondary | ICD-10-CM

## 2014-12-11 DIAGNOSIS — R52 Pain, unspecified: Secondary | ICD-10-CM

## 2014-12-11 MED ORDER — HYDROMORPHONE HCL 1 MG/ML IJ SOLN
INTRAMUSCULAR | Status: AC
  Filled 2014-12-11: qty 1

## 2014-12-11 MED ORDER — DIAZEPAM 5 MG PO TABS
ORAL_TABLET | ORAL | Status: AC
  Filled 2014-12-11: qty 1

## 2014-12-11 NOTE — ED Notes (Signed)
Patient was discharged at 0415hrs.  See paper charting.

## 2014-12-11 NOTE — ED Notes (Signed)
Pt. Reports right shouldert pain worse with movement and certain positions onset Sept. last year , denies injury or fall, pt. Aaso reported right lower back pain radiating to right leg ,ambulatory.

## 2014-12-11 NOTE — ED Provider Notes (Signed)
CSN: [id number]     Arrival date & time 12/10/14  2339 History   None    Chief Complaint  Patient presents with  . Shoulder Pain  . Back Pain     (Consider location/radiation/quality/duration/timing/severity/associated sxs/prior Treatment) HPI Comments: Complaining of chronic right lower back pain that has gotten worse in the last week. She notes the pain radiates down her whole right leg. Patient denies any injury or trauma. She notes difficulty ambulating secondary to pain, but denies any falls. She has tried Aleve and a topical heat rub without relief. Patient also notes constant right shoulder pain that been ongoing for several months, but has worsened in the last week. She states the shoulder pain is worse with movement and relieved with rest. Patient reports she was seen here a few months ago for the same and was given pain medication and Flexeril without significant improvement. She denies abdominal pain, chest pain, fever, nausea, vomiting, diarrhea, bowel/bladder incontinence, or any urinary symptoms. She denies any chances of pregnancy. Patient receives her primary care at the [facility].  Patient is a 49 y.o. female presenting with shoulder pain and back pain. The history is provided by the patient.  Shoulder Pain Location:  Shoulder Injury: no   Shoulder location:  R shoulder Pain details:    Quality:  Aching   Radiates to:  R arm   Severity:  Moderate   Onset quality:  Gradual   Timing:  Constant   Progression:  Worsening Chronicity:  Recurrent Handedness:  Right-handed Dislocation: no   Prior injury to area:  No Relieved by:  Nothing Worsened by:  Nothing tried Associated symptoms: back pain   Associated symptoms: no fever, no swelling and no tingling   Back Pain Associated symptoms: no fever     Past Medical History  Diagnosis Date  . Thyroid disease    Past Surgical History  Procedure Laterality Date  . Thyroidectomy    . Partial hysterectomy     No  family history on file. History  Substance Use Topics  . Smoking status: Current Every Day Smoker -- 0.50 packs/day  . Smokeless tobacco: Not on file  . Alcohol Use: Yes     Comment: occassional   OB History    Gravida Para Term Preterm AB TAB SAB Ectopic Multiple Living   3 3 3  0 0 0 0 0 0 3     Review of Systems  Constitutional: Negative for fever.  Respiratory: Negative for cough and shortness of breath.   Musculoskeletal: Positive for back pain.  All other systems reviewed and are negative.     Allergies  Sulfa antibiotics  Home Medications   Prior to Admission medications   Medication Sig Start Date End Date Taking? Authorizing Provider  HYDROcodone-acetaminophen (NORCO/VICODIN) 5-325 MG per tablet Take 1-2 tablets by mouth every 6 hours as needed for pain. 08/08/14   Nicole Pisciotta, PA-C  levothyroxine (SYNTHROID, LEVOTHROID) 125 MCG tablet Take 1 tablet (125 mcg total) by mouth daily before breakfast. 01/28/14   Reyne Dumas, MD  methocarbamol (ROBAXIN) 500 MG tablet Take 1 tablet (500 mg total) by mouth 2 (two) times daily. 08/08/14   Nicole Pisciotta, PA-C  naproxen sodium (ANAPROX) 220 MG tablet Take 440 mg by mouth daily as needed (for pain).    Historical Provider, MD   BP 136/92 mmHg  Pulse 68  Temp(Src) 97.9 F (36.6 C) (Oral)  Resp 18  Ht 5\' 8"  (1.727 m)  Wt 216 lb (97.977 kg)  BMI 32.85 kg/m2  SpO2 99% Physical Exam  Constitutional: She is oriented to person, place, and time. She appears well-developed and well-nourished. No distress.  HENT:  Head: Normocephalic and atraumatic.  Mouth/Throat: Oropharynx is clear and moist.  Eyes: EOM are normal. Pupils are equal, round, and reactive to light.  Neck: Normal range of motion. Neck supple.  Cardiovascular: Normal rate and regular rhythm.  Exam reveals no friction rub.   No murmur heard. Pulmonary/Chest: Effort normal and breath sounds normal. No respiratory distress. She has no wheezes. She has no rales.   Abdominal: Soft. She exhibits no distension. There is no tenderness. There is no rebound.  Musculoskeletal: She exhibits no edema.       Right shoulder: She exhibits decreased range of motion and tenderness. She exhibits no swelling, no laceration, no spasm, normal pulse and normal strength.  Neurological: She is alert and oriented to person, place, and time.  Skin: She is not diaphoretic.  Nursing note and vitals reviewed.   ED Course  Procedures (including critical care time) Labs Review Labs Reviewed - No data to display  Imaging Review No results found.   EKG Interpretation None      MDM   Final diagnoses:  Right shoulder pain  Right low back pain, with sciatica presence unspecified    49 year old female here with right shoulder pain and right sciatica type pain. Has history of both of these pains before, but they're flaring up recently. No known injury. No fevers, nausea, vomiting. Denies any loss of [place] lower bladder control. Denies any difficulty ambulating. No tingling or numbness. On exam she has some right shoulder tenderness and pain with movement, but joint is not swollen, no concern for septic joint. She also has right sided sciatica type pain. No muscle spasm, does have some very lower lumbar lateral pain. I suspect both of these are secondary to degenerative disease and arthritis, these are both likely musculoskeletal; no concern forb neurologic lesions. Patient given pain meds with some relief. Imaging normal. Instructed to follow-up with physical therapy and with her PCP. Stable for discharge.    Evelina Bucy, MD 12/11/14 [id number]

## 2014-12-16 ENCOUNTER — Encounter: Payer: Self-pay | Admitting: Internal Medicine

## 2014-12-16 ENCOUNTER — Ambulatory Visit: Payer: BLUE CROSS/BLUE SHIELD | Attending: Internal Medicine | Admitting: Internal Medicine

## 2014-12-16 VITALS — BP 123/83 | HR 68 | Temp 98.1°F | Resp 16 | Ht 68.0 in | Wt 221.0 lb

## 2014-12-16 DIAGNOSIS — Z2821 Immunization not carried out because of patient refusal: Secondary | ICD-10-CM

## 2014-12-16 DIAGNOSIS — E079 Disorder of thyroid, unspecified: Secondary | ICD-10-CM | POA: Insufficient documentation

## 2014-12-16 DIAGNOSIS — M25511 Pain in right shoulder: Secondary | ICD-10-CM | POA: Insufficient documentation

## 2014-12-16 DIAGNOSIS — Z79899 Other long term (current) drug therapy: Secondary | ICD-10-CM | POA: Diagnosis not present

## 2014-12-16 DIAGNOSIS — F17210 Nicotine dependence, cigarettes, uncomplicated: Secondary | ICD-10-CM | POA: Insufficient documentation

## 2014-12-16 MED ORDER — TRAMADOL HCL 50 MG PO TABS
50.0000 mg | ORAL_TABLET | Freq: Two times a day (BID) | ORAL | Status: DC | PRN
Start: 2014-12-16 — End: 2014-12-16

## 2014-12-16 MED ORDER — TRAMADOL HCL 50 MG PO TABS: 50.0000 mg | ORAL_TABLET | Freq: Two times a day (BID) | ORAL | Status: DC | PRN

## 2014-12-16 NOTE — Progress Notes (Signed)
Patient ID: Meagan Flores, female   DOB: 06-Jun-1966, 49 y.o.   MRN: [id number]  CC: right shoulder pain  HPI: Meagan Flores is a 49 y.o. female here today for a follow up visit.  Patient has past medical history of of thyroid disease. Patient notes constant right shoulder pain that been ongoing for several months, but has worsened in the last week. She states the shoulder pain is worse with movement and relieved with rest. She was evaluated on 12/11/13 in the ER for the same complaint and was given pain medication and muscle relaxer's.   Patient has No headache, No chest pain, No abdominal pain - No Nausea, No new weakness tingling or numbness, No Cough - SOB.  Allergies  Allergen Reactions  . Sulfa Antibiotics Hives   Past Medical History  Diagnosis Date  . Thyroid disease    Current Outpatient Prescriptions on File Prior to Visit  Medication Sig Dispense Refill  . HYDROcodone-acetaminophen (NORCO/VICODIN) 5-325 MG per tablet Take 1-2 tablets by mouth every 6 hours as needed for pain. 15 tablet 0  . levothyroxine (SYNTHROID, LEVOTHROID) 125 MCG tablet Take 1 tablet (125 mcg total) by mouth daily before breakfast. 30 tablet 2  . methocarbamol (ROBAXIN) 500 MG tablet Take 1 tablet (500 mg total) by mouth 2 (two) times daily. 20 tablet 0  . naproxen sodium (ANAPROX) 220 MG tablet Take 440 mg by mouth daily as needed (for pain).     No current facility-administered medications on file prior to visit.   History reviewed. No pertinent family history. History   Social History  . Marital Status: Married    Spouse Name: N/A    Number of Children: N/A  . Years of Education: N/A   Occupational History  . Not on file.   Social History Main Topics  . Smoking status: Current Every Day Smoker -- 0.50 packs/day  . Smokeless tobacco: Not on file  . Alcohol Use: Yes     Comment: occassional  . Drug Use: No  . Sexual Activity: Yes    Birth Control/ Protection: Surgical   Other Topics Concern   . Not on file   Social History Narrative    Review of Systems: See HPI    Objective:   Filed Vitals:   12/16/14 1728  BP: 123/83  Pulse: 68  Temp: 98.1 F (36.7 C)  Resp: 16    Physical Exam  Constitutional: She is oriented to person, place, and time.  Cardiovascular: Normal rate, regular rhythm and normal heart sounds.   Pulmonary/Chest: Effort normal and breath sounds normal.  Abdominal: Soft.  Musculoskeletal: Normal range of motion. She exhibits no tenderness.  Neurological: She is alert and oriented to person, place, and time.  Skin: Skin is warm and dry.  Psychiatric: She has a normal mood and affect.     Lab Results  Component Value Date   WBC 6.4 01/29/2014   HGB 12.5 01/29/2014   HCT 39.2 01/29/2014   MCV 74.8* 01/29/2014   PLT 261 01/29/2014   Lab Results  Component Value Date   CREATININE 0.69 01/29/2014   BUN 8 01/29/2014   NA 139 01/29/2014   K 3.5 01/29/2014   CL 102 01/29/2014   CO2 31 01/29/2014    No results found for: HGBA1C Lipid Panel     Component Value Date/Time   CHOL 194 01/29/2014 1550   TRIG 90 01/29/2014 1550   HDL 51 01/29/2014 1550   CHOLHDL 3.8 01/29/2014 1550  VLDL 18 01/29/2014 1550   LDLCALC 125* 01/29/2014 1550       Assessment and plan:   Meagan Flores was seen today for follow-up and shoulder pain.  Diagnoses and associated orders for this visit:  Right shoulder pain - Ambulatory referral to Orthopedic Surgery - traMADol (ULTRAM) 50 MG tablet; Take 1 tablet (50 mg total) by mouth every 12 (twelve) hours as needed. Recent x-rays revealed AC spurring  Refused influenza vaccine Explained that annual influenza is recommended per CDC guidelines and is highly suggested to anyone who has has CHF, COPD, DM or immunocompromised. Benefits of influenza described in detail.   Return if symptoms worsen or fail to improve.        Chari Manning, [facility] [phone] 12/16/2014, 5:42 PM

## 2014-12-16 NOTE — Progress Notes (Signed)
Pt comes in with c/o right shoulder pain x 3 weeks intermit unrelieved by prescribed medications Throbbing,achy pain noted with movement Declined flu vaccine

## 2014-12-16 NOTE — Patient Instructions (Addendum)
[place]  Shoulder Pain The shoulder is the joint that connects your arms to your body. The bones that form the shoulder joint include the upper arm bone (humerus), the shoulder blade (scapula), and the collarbone (clavicle). The top of the humerus is shaped like a ball and fits into a rather flat socket on the scapula (glenoid cavity). A combination of muscles and strong, fibrous tissues that connect muscles to bones (tendons) support your shoulder joint and hold the ball in the socket. Small, fluid-filled sacs (bursae) are located in different areas of the joint. They act as cushions between the bones and the overlying soft tissues and help reduce friction between the gliding tendons and the bone as you move your arm. Your shoulder joint allows a wide range of motion in your arm. This range of motion allows you to do things like scratch your back or throw a ball. However, this range of motion also makes your shoulder more prone to pain from overuse and injury. Causes of shoulder pain can originate from both injury and overuse and usually can be grouped in the following four categories:  Redness, swelling, and pain (inflammation) of the tendon (tendinitis) or the bursae (bursitis).  Instability, such as a dislocation of the joint.  Inflammation of the joint (arthritis).  Broken bone (fracture). HOME CARE INSTRUCTIONS   Apply ice to the sore area.  Put ice in a plastic bag.  Place a towel between your skin and the bag.  Leave the ice on for 15-20 minutes, 3-4 times per day for the first 2 days, or as directed by your health care provider.  Stop using cold packs if they do not help with the pain.  If you have a shoulder sling or immobilizer, wear it as long as your caregiver instructs. Only remove it to shower or bathe. Move your arm as little as possible, but keep your hand moving to prevent swelling.  Squeeze a soft ball or foam pad as much as possible to help prevent  swelling.  Only take over-the-counter or prescription medicines for pain, discomfort, or fever as directed by your caregiver. SEEK MEDICAL CARE IF:   Your shoulder pain increases, or new pain develops in your arm, hand, or fingers.  Your hand or fingers become cold and numb.  Your pain is not relieved with medicines. SEEK IMMEDIATE MEDICAL CARE IF:   Your arm, hand, or fingers are numb or tingling.  Your arm, hand, or fingers are significantly swollen or turn white or blue. MAKE SURE YOU:   Understand these instructions.  Will watch your condition.  Will get help right away if you are not doing well or get worse. Document Released: 08/25/2005 Document Revised: 04/01/2014 Document Reviewed: 10/30/2011 [facility] Patient Information 2015 [place], [place]. This information is not intended to replace advice given to you by your health care provider. Make sure you discuss any questions you have with your health care provider.

## 2014-12-27 ENCOUNTER — Encounter: Payer: Self-pay | Admitting: Family Medicine

## 2014-12-27 ENCOUNTER — Ambulatory Visit (INDEPENDENT_AMBULATORY_CARE_PROVIDER_SITE_OTHER): Payer: BLUE CROSS/BLUE SHIELD | Admitting: Family Medicine

## 2014-12-27 VITALS — BP 132/79 | Ht 68.0 in | Wt 222.0 lb

## 2014-12-27 DIAGNOSIS — M19011 Primary osteoarthritis, right shoulder: Secondary | ICD-10-CM | POA: Diagnosis not present

## 2014-12-27 DIAGNOSIS — M25511 Pain in right shoulder: Secondary | ICD-10-CM

## 2014-12-27 DIAGNOSIS — M19019 Primary osteoarthritis, unspecified shoulder: Secondary | ICD-10-CM

## 2014-12-27 DIAGNOSIS — M129 Arthropathy, unspecified: Secondary | ICD-10-CM

## 2014-12-27 HISTORY — DX: Primary osteoarthritis, unspecified shoulder: M19.019

## 2014-12-27 MED ORDER — METHYLPREDNISOLONE ACETATE 40 MG/ML IJ SUSP
40.0000 mg | Freq: Once | INTRAMUSCULAR | Status: AC
  Administered 2014-12-27: 40 mg via INTRA_ARTICULAR

## 2014-12-27 NOTE — Progress Notes (Signed)
Meagan Flores - 49 y.o. female MRN [id number]  Date of birth: 17-Dec-1965  Subjective:  HPI Comments: Right shoulder Pain: Insidious onset over the past 4 months.  No known trauma/injury.  Began to hurt during work but no prior single incident. Pain with overhead motion or lifting.  Pain at night. Tried aleeve and ibuprofen without relief.  Provided Opoids and tramdol and muscle relaxers without significant relief. Occasional entire RUE dysthesia without dermatomal distribution  Review of Systems  Constitutional: Negative for unexpected weight change.    History:  Past Medical, Surgical, Social, and Family History reviewed & updated per EMR.  Pertinent Historical Findings include:  reports that she has been smoking.  She does not have any smokeless tobacco history on file.  Thyroid disease s/p thyroidectomy Current everyday smoker   Historical Data Reviewed: 12/11/2014: X-Ray R Shoulder: Moderate AC arthritis, no fracture dislocation, overall well aligned    Objective:  VS:   HT:5\' 8"  (172.7 cm)   WT:222 lb (100.699 kg)  BMI:33.8          BP:132/79 mmHg  HR:  bpm  TEMP: ( )  RESP:   PHYSICAL EXAM:  Physical Exam  Constitutional: She appears well-developed and well-nourished. No distress.  Neck:  Well healed prior median sternotomy  Cardiovascular: Intact distal pulses.   Respiratory: Effort normal and breath sounds normal.  Musculoskeletal:       Right shoulder: She exhibits decreased range of motion, tenderness, bony tenderness (ac joint), crepitus (over AC joint with Axial loading) and pain. She exhibits no swelling, no effusion, no deformity, normal pulse and normal strength.  Weakness and pain with empty can. ROM: Internal rotation PSIS, external 80, abduction 100. Flexion 140  Skin: She is not diaphoretic.    DATA OBTAINED DURING VISIT:   Limited MSK Ultrasound of Right Shoulder: Findings: Biceps Tendon: Intact Pec Major Insertion: Normal Subscapularis Tendon: Marked  hypoechoic change with calcific changes at distal insertion surrounding significantly thickened. Appears to be a full-thickness minimally retracted Supraspinatus Tendon:Abnormal- thin, intact. No overt splitting. Infraspinatus/Teres Minor Tendon:Normal AC Joint:Mild to moderate degenerative changes with mild effusion  Impression: The above findings are consistent with subscapularis chronic tear, mild degenerative changes of before meals joint.      Limited MSK Ultrasound of bilateral ankles: Findings:  mild small effusion of or lateral pad. Normal plantar fascia. Tendons are grossly normal   Impression: The above findings are consistent with synovitis potential small bilateral ankle effusions.     Assessment & Plan:   1. Shoulder pain, right   2. Glenohumeral arthritis, right   3. AC (acromioclavicular) arthritis    Problem  Glenohumeral Arthritis     PROCEDURE NOTE : Right Subacromial Injection After discussing the risks, benefits and expected outcomes of the injection and all questions were reviewed and answered,  she wished to undergo the above named procedure.  Written consent was obtained. After an appropriate time out was taken the right shoulder was sterilely prepped and injected as below: Prep:    Betadine and alcohol,  Ethel chloride.  Approach:  Posterior shoulder Needle:  22g 1.5inch Meds:   4cc 1% lidocaine, 1cc 40mg  depomedrol A bandaid was applied to the area. This procedure was well tolerated and there were no complications.     Refer to physical therapy  Blood work today wait for gout  Rx Today: Colchicine for potential gout flare and for pain control.  HEP: Basic Shoulder Handout reviewed.  > Return in about 4 weeks (  around 01/24/2015) for consideration of US guided Glenohumeral Injection vs AC.

## 2015-01-24 ENCOUNTER — Encounter: Payer: Self-pay | Admitting: Family Medicine

## 2015-01-24 ENCOUNTER — Ambulatory Visit (INDEPENDENT_AMBULATORY_CARE_PROVIDER_SITE_OTHER): Payer: BLUE CROSS/BLUE SHIELD | Admitting: Family Medicine

## 2015-01-24 VITALS — BP 136/93 | HR 91 | Ht 68.0 in | Wt 222.0 lb

## 2015-01-24 DIAGNOSIS — M25511 Pain in right shoulder: Secondary | ICD-10-CM

## 2015-01-24 MED ORDER — METHYLPREDNISOLONE ACETATE 40 MG/ML IJ SUSP
40.0000 mg | Freq: Once | INTRAMUSCULAR | Status: AC
  Administered 2015-01-24: 40 mg via INTRA_ARTICULAR

## 2015-01-24 MED ORDER — NITROGLYCERIN 0.2 MG/HR TD PT24: MEDICATED_PATCH | TRANSDERMAL | Status: DC

## 2015-01-24 NOTE — Assessment & Plan Note (Signed)
Mild to moderate effusion of right AC joint with partial thickness supraspinatus tear without retraction on MSK ultrasound. Symptomatically seems to be improving but still reports pain. -Ultrasound-guided AC joint injection -Nitroglycerin protocol -Supraspinatus strengthening exercises with theraband described to patient. Continue range of motion exercises. -Follow-up 3 weeks.

## 2015-01-24 NOTE — Patient Instructions (Signed)
Good to see you today.  You still have some swelling around the [facility] joint that we injected. The injection should help. Continue to do range of motion exercises. Do strengthening exercises with theraband that we showed you once daily (hold 2-3 seconds, then bring down). Do not go beyond where it hurts. Use nitroglycerin 1/4 patch daily to this area where it hurts. Follow up in 2-3 weeks. Best.

## 2015-01-24 NOTE — Progress Notes (Signed)
   Subjective:    Patient ID: Meagan Flores, female    DOB: 26-Sep-1966,  49 y.o.   MRN: [id number]  HPI Meagan Flores is a 49 year-old right hand dominant female who presents for follow-up of right shoulder pain. Onset was about 5 months ago without any known acute injury. She was last seen in late January where MSK [place] was performed showing rotator cuff tendinopathy and moderate degenerative changes at the acromioclavicular joint.She received a subacromial corticosteroid injection at that time. She was referred to physical therapy. She was given colchicine for pain relief, as there was some suspicion for potential gout due to her bilateral ankle effusions. She has not had lab work done. In the past she had tried tramadol, Norco and Percocet without relief.   Since that time, she continues to have right anterior shoulder pain. She does home physical therapy twice a day. Pain is worse with forward flexion and adduction. It has improved somewhat from a 9 to a 6 out of 10. Range of motion is also improved. She is taking Aleve twice a day.  Past medical history, social history, medications, and allergies were reviewed and are up to date in the chart.  Review of Systems 7 point review of systems was performed and was otherwise negative unless noted in the history of present illness.     Objective:   Physical Exam BP 136/93 mmHg  Pulse 91  Ht 5\' 8"  (1.727 m)  Wt 222 lb (100.699 kg)  BMI 33.76 kg/m2  GEN: NAD SKIN: warm and well-perfused, no rash   MSK:  Right shoulder No deformities or asymmetry on inspection Forward flexion and abduction about 100 active range of motion, nearly full range of motion with passive forward flexion and abduction Mild generalized tenderness throughout shoulder Mildly positive right-sided Hawkins and empty can sign, negative speed's and Yergason's 5 out of 5 shoulder abduction  Right shoulder MSK ultrasound: Biceps Tendon: Intact Supraspinatus Tendon:partial  thickness tear without retraction AC Joint:Mild to moderate degenerative changes with mild effusionstill present  Procedure:  Injection of right AC joint under ultrasound guidance. Consent obtained and verified. Time-out conducted. Noted no overlying erythema, induration, or other signs of local infection. Skin prepped in a sterile fashion. Topical analgesic spray: Ethyl chloride. Completed without difficulty. Meds: 1:1 depomedrol 40mg  Pain immediately improved suggesting accurate placement of the medication. Advised to call if fevers/chills, erythema, induration, drainage, or persistent bleeding.    Assessment & Plan:

## 2015-01-26 NOTE — Progress Notes (Signed)
[facility]: Attending Note: I have reviewed the chart, discussed wit the Sports Medicine Fellow. I agree with assessment and treatment plan as detailed in the Fellow's note.  

## 2015-02-14 ENCOUNTER — Encounter: Payer: Self-pay | Admitting: Family Medicine

## 2015-02-14 ENCOUNTER — Ambulatory Visit (INDEPENDENT_AMBULATORY_CARE_PROVIDER_SITE_OTHER): Payer: BLUE CROSS/BLUE SHIELD | Admitting: Family Medicine

## 2015-02-14 VITALS — BP 130/84 | HR 92 | Ht 68.0 in | Wt 209.0 lb

## 2015-02-14 DIAGNOSIS — M25511 Pain in right shoulder: Secondary | ICD-10-CM | POA: Diagnosis not present

## 2015-02-14 MED ORDER — METHYLPREDNISOLONE ACETATE 40 MG/ML IJ SUSP
40.0000 mg | Freq: Once | INTRAMUSCULAR | Status: AC
  Administered 2015-02-14: 40 mg via INTRA_ARTICULAR

## 2015-02-14 NOTE — Progress Notes (Signed)
Patient ID: Meagan Flores, female   DOB: 1966-03-06, 48 y.o.   MRN: [id number] [facility]: Attending Note: I have reviewed the chart, discussed wit the Sports Medicine Fellow. I agree with assessment and treatment plan as detailed in the [place] note.  I performed the injection of the subacromial bursa. She had a lot of improvement after each of her prior injections and I think we are making progress. She needs to really work on the home exercise program particularly internal and external rotation exercises and we discussed that length today. Hopefully this injection will give her enough pain relief that she can proceed forward with aggressive home exercise program. I do not plan any more injections.

## 2015-02-14 NOTE — Progress Notes (Signed)
   Subjective:    Patient ID: Meagan Flores, female    DOB: 1966-04-11,  49 y.o.   MRN: [id number]  HPI Meagan Flores is a 49 year-old right hand dominant female who presents for follow-up of right shoulder pain. Onset was about 5 months ago without any known acute injury. She was last seen in late January where MSK [place] was performed showing rotator cuff tendinopathy and moderate degenerative changes at the acromioclavicular joint.She received a subacromial corticosteroid injection at that time. She was referred to physical therapy. She was given colchicine for pain relief, as there was some suspicion for potential gout due to her bilateral ankle effusions. She has not had lab work done. In the past she had tried tramadol, Norco and Percocet without relief.   Since that time, she continues to have right anterior shoulder pain. She does home physical therapy twice a day. Pain is worse with forward flexion and adduction. It has improved somewhat from a 9 to a 6 out of 10. Range of motion is also improved. She is taking Aleve twice a day.  Past medical history, social history, medications, and allergies were reviewed and are up to date in the chart.  Review of Systems 7 point review of systems was performed and was otherwise negative unless noted in the history of present illness.     Objective:   Physical Exam BP 130/84 mmHg  Pulse 92  Ht 5\' 8"  (1.727 m)  Wt 209 lb (94.802 kg)  BMI 31.79 kg/m2  GEN: NAD SKIN: warm and well-perfused, no rash   MSK:  Right shoulder No deformities or asymmetry on inspection Forward flexion and abduction about 100 active range of motion, nearly full range of motion with passive forward flexion and abduction Mild generalized tenderness throughout shoulder Mildly positive right-sided Hawkins and empty can sign, negative speed's and Yergason's 5 out of 5 shoulder abduction    INJECTION: Patient was given informed consent, signed copy in the chart. Appropriate  time out was taken. Area prepped and draped in usual sterile fashion. 1 cc of methylprednisolone 40 mg/ml plus  3 cc of 1% lidocaine without epinephrine was injected into the right subacromial bursa using a(n) posterior approach. The patient tolerated the procedure well. There were no complications. Post procedure instructions were given. g.    Assessment & Plan:

## 2015-02-20 ENCOUNTER — Emergency Department (HOSPITAL_COMMUNITY): Payer: BLUE CROSS/BLUE SHIELD

## 2015-02-20 ENCOUNTER — Emergency Department (HOSPITAL_COMMUNITY)
Admission: EM | Admit: 2015-02-20 | Discharge: 2015-02-21 | Disposition: A | Discharge status: Home / Self Care | Payer: BLUE CROSS/BLUE SHIELD | Attending: Emergency Medicine | Admitting: Emergency Medicine

## 2015-02-20 ENCOUNTER — Encounter (HOSPITAL_COMMUNITY): Payer: Self-pay | Admitting: Emergency Medicine

## 2015-02-20 DIAGNOSIS — Z720 Tobacco use: Secondary | ICD-10-CM | POA: Insufficient documentation

## 2015-02-20 DIAGNOSIS — Z79899 Other long term (current) drug therapy: Secondary | ICD-10-CM | POA: Diagnosis not present

## 2015-02-20 DIAGNOSIS — E079 Disorder of thyroid, unspecified: Secondary | ICD-10-CM | POA: Insufficient documentation

## 2015-02-20 DIAGNOSIS — B349 Viral infection, unspecified: Secondary | ICD-10-CM | POA: Diagnosis not present

## 2015-02-20 DIAGNOSIS — M791 Myalgia: Secondary | ICD-10-CM | POA: Diagnosis present

## 2015-02-20 LAB — BASIC METABOLIC PANEL
Anion gap: 7 (ref 5–15)
BUN: 9 mg/dL (ref 6–23)
CHLORIDE: 103 mmol/L (ref 96–112)
CO2: 30 mmol/L (ref 19–32)
Calcium: 8.7 mg/dL (ref 8.4–10.5)
Creatinine, Ser: 0.76 mg/dL (ref 0.50–1.10)
GFR calc Af Amer: >90 mL/min (ref >90)
GFR calc non Af Amer: >90 mL/min (ref >90)
GLUCOSE: 102 mg/dL — AB (ref 70–99)
Potassium: 3.8 mmol/L (ref 3.5–5.1)
Sodium: 140 mmol/L (ref 135–145)

## 2015-02-20 LAB — CBC
HEMATOCRIT: 38.8 % (ref 36.0–46.0)
Hemoglobin: 12.6 g/dL (ref 12.0–15.0)
MCH: 23.9 pg — ABNORMAL LOW (ref 26.0–34.0)
MCHC: 32.5 g/dL (ref 30.0–36.0)
MCV: 73.6 fL — AB (ref 78.0–100.0)
Platelets: 238 10*3/uL (ref 150–400)
RBC: 5.27 MIL/uL — AB (ref 3.87–5.11)
RDW: 14.5 % (ref 11.5–15.5)
WBC: 8.4 10*3/uL (ref 4.0–10.5)

## 2015-02-20 MED ORDER — SODIUM CHLORIDE 0.9 % IV BOLUS (SEPSIS)
1000.0000 mL | Freq: Once | INTRAVENOUS | Status: AC
  Administered 2015-02-20: 1000 mL via INTRAVENOUS

## 2015-02-20 NOTE — ED Notes (Signed)
Pt presents with generalized body aches, fatigue, productive cough, headache, and nausea onset yesterday.  Denies medications PTA.

## 2015-02-20 NOTE — ED Provider Notes (Signed)
CSN: [id number]     Arrival date & time 02/20/15  2058 History   First MD Initiated Contact with Patient 02/20/15 2255     Chief Complaint  Patient presents with  . Generalized Body Aches     (Consider location/radiation/quality/duration/timing/severity/associated sxs/prior Treatment) Patient is a 49 y.o. female presenting with URI.  URI Presenting symptoms: cough and fatigue   Severity:  Moderate Onset quality:  Gradual Duration:  1 day Timing:  Constant Progression:  Waxing and waning Chronicity:  New Relieved by:  None tried Associated symptoms: headaches   Associated symptoms: no myalgias and no wheezing   Risk factors: no chronic kidney disease, no chronic respiratory disease and no immunosuppression     Past Medical History  Diagnosis Date  . Thyroid disease    Past Surgical History  Procedure Laterality Date  . Thyroidectomy    . Partial hysterectomy     No family history on file. History  Substance Use Topics  . Smoking status: Current Every Day Smoker -- 0.50 packs/day  . Smokeless tobacco: Not on file  . Alcohol Use: Yes     Comment: occassional   OB History    Gravida Para Term Preterm AB TAB SAB Ectopic Multiple Living   3 3 3  0 0 0 0 0 0 3     Review of Systems  Constitutional: Positive for fatigue.  Respiratory: Positive for cough. Negative for wheezing.   Gastrointestinal: Positive for nausea and diarrhea.  Musculoskeletal: Negative for myalgias.  Skin: Negative for rash.  Neurological: Positive for headaches.  All other systems reviewed and are negative.     Allergies  Sulfa antibiotics  Home Medications   Prior to Admission medications   Medication Sig Start Date End Date Taking? Authorizing Provider  diazepam (VALIUM) 5 MG tablet  12/11/14   Historical Provider, MD  levothyroxine (SYNTHROID, LEVOTHROID) 125 MCG tablet Take 1 tablet (125 mcg total) by mouth daily before breakfast. 01/28/14   Reyne Dumas, MD  naproxen sodium (ANAPROX)  220 MG tablet Take 440 mg by mouth daily as needed (for pain).    Historical Provider, MD  nitroGLYCERIN (NITRODUR - DOSED IN MG/24 HR) 0.2 mg/hr patch Cut and apply 1/4 patch to most painful area q24h. 01/24/15   Hilton Sinclair, MD   BP 112/81 mmHg  Pulse 79  Temp(Src) 98.2 F (36.8 C) (Oral)  Resp 14  Ht 5\' 8"  (1.727 m)  Wt 210 lb (95.255 kg)  BMI 31.94 kg/m2  SpO2 97% Physical Exam  Constitutional: She is oriented to person, place, and time. She appears well-developed and well-nourished.  HENT:  Head: Normocephalic.  Mouth/Throat: Oropharynx is clear and moist.  Eyes: Pupils are equal, round, and reactive to light.  Neck: Neck supple.  Cardiovascular: Normal rate and regular rhythm.   Pulmonary/Chest: Effort normal and breath sounds normal.  Abdominal: Soft. Bowel sounds are normal.  Musculoskeletal: She exhibits no edema or tenderness.  Lymphadenopathy:    She has no cervical adenopathy.  Neurological: She is alert and oriented to person, place, and time.  Skin: Skin is warm and dry.  Psychiatric: She has a normal mood and affect.  Nursing note and vitals reviewed.   ED Course  Procedures (including critical care time) Labs Review Labs Reviewed  CBC - Abnormal; Notable for the following:    RBC 5.27 (*)    MCV 73.6 (*)    MCH 23.9 (*)    All other components within normal limits  BASIC METABOLIC PANEL -  Abnormal; Notable for the following:    Glucose, Bld 102 (*)    All other components within normal limits    Imaging Review Dg Chest 2 View  02/20/2015   CLINICAL DATA:  Generalized body aches. Cough, weakness, and fatigue for 2 days.  EXAM: CHEST  2 VIEW  COMPARISON:  12/11/2013  FINDINGS: Two mediastinal wires again seen. The cardiomediastinal contours are normal. Mild lower lobe bronchial thickening. Pulmonary vasculature is normal. No consolidation, pleural effusion, or pneumothorax. No acute osseous abnormalities are seen.  IMPRESSION: Mild bronchial  thickening in the lower lobes, may reflect bronchitis. No evidence of pneumonia.   Electronically Signed   By: Jeb Levering M.D.   On: 02/20/2015 22:10     EKG Interpretation None     Lab and radiology results reviewed and shared with patient.  No pneumonia on CXR.  Patient feels better after IV fluids.  Discharge home with anti-emetic and instructions for symptomatic care.  Return precautions discussed.  Follow-up with PCP. MDM   Final diagnoses:  None    Viral illness.    Etta Quill, NP 02/21/15 [id number]  Sherwood Gambler, MD 02/25/15 [phone]

## 2015-02-21 MED ORDER — ONDANSETRON 4 MG PO TBDP: ORAL_TABLET | ORAL | Status: DC

## 2015-02-21 NOTE — Discharge Instructions (Signed)

## 2015-06-02 ENCOUNTER — Emergency Department (HOSPITAL_COMMUNITY)
Admission: EM | Admit: 2015-06-02 | Discharge: 2015-06-02 | Disposition: A | Discharge status: Home / Self Care | Payer: BLUE CROSS/BLUE SHIELD | Attending: Emergency Medicine | Admitting: Emergency Medicine

## 2015-06-02 ENCOUNTER — Encounter (HOSPITAL_COMMUNITY): Payer: Self-pay | Admitting: Emergency Medicine

## 2015-06-02 DIAGNOSIS — Z720 Tobacco use: Secondary | ICD-10-CM | POA: Diagnosis not present

## 2015-06-02 DIAGNOSIS — Z79899 Other long term (current) drug therapy: Secondary | ICD-10-CM | POA: Diagnosis not present

## 2015-06-02 DIAGNOSIS — M25571 Pain in right ankle and joints of right foot: Secondary | ICD-10-CM | POA: Insufficient documentation

## 2015-06-02 DIAGNOSIS — M5440 Lumbago with sciatica, unspecified side: Secondary | ICD-10-CM | POA: Diagnosis not present

## 2015-06-02 DIAGNOSIS — M5442 Lumbago with sciatica, left side: Secondary | ICD-10-CM

## 2015-06-02 DIAGNOSIS — M545 Low back pain: Secondary | ICD-10-CM | POA: Diagnosis present

## 2015-06-02 DIAGNOSIS — M5441 Lumbago with sciatica, right side: Secondary | ICD-10-CM

## 2015-06-02 DIAGNOSIS — Z90710 Acquired absence of both cervix and uterus: Secondary | ICD-10-CM | POA: Diagnosis not present

## 2015-06-02 DIAGNOSIS — E079 Disorder of thyroid, unspecified: Secondary | ICD-10-CM | POA: Diagnosis not present

## 2015-06-02 MED ORDER — HYDROCODONE-ACETAMINOPHEN 5-325 MG PO TABS: 2.0000 | ORAL_TABLET | ORAL | Status: DC | PRN

## 2015-06-02 MED ORDER — CYCLOBENZAPRINE HCL 10 MG PO TABS: 10.0000 mg | ORAL_TABLET | Freq: Two times a day (BID) | ORAL | Status: DC | PRN

## 2015-06-02 NOTE — Discharge Instructions (Signed)
Ankle Pain Ankle pain is a common symptom. The bones, cartilage, tendons, and muscles of the ankle joint perform a lot of work each day. The ankle joint holds your body weight and allows you to move around. Ankle pain can occur on either side or back of 1 or both ankles. Ankle pain may be sharp and burning or dull and aching. There may be tenderness, stiffness, redness, or warmth around the ankle. The pain occurs more often when a person walks or puts pressure on the ankle. CAUSES  There are many reasons ankle pain can develop. It is important to work with your caregiver to identify the cause since many conditions can impact the bones, cartilage, muscles, and tendons. Causes for ankle pain include:  Injury, including a break (fracture), sprain, or strain often due to a fall, sports, or a high-impact activity.  Swelling (inflammation) of a tendon (tendonitis).  Achilles tendon rupture.  Ankle instability after repeated sprains and strains.  Poor foot alignment.  Pressure on a nerve (tarsal tunnel syndrome).  Arthritis in the ankle or the lining of the ankle.  Crystal formation in the ankle (gout or pseudogout). DIAGNOSIS  A diagnosis is based on your medical history, your symptoms, results of your physical exam, and results of diagnostic tests. Diagnostic tests may include X-ray exams or a computerized magnetic scan (magnetic resonance imaging, MRI). TREATMENT  Treatment will depend on the cause of your ankle pain and may include:  Keeping pressure off the ankle and limiting activities.  Using crutches or other walking support (a cane or brace).  Using rest, ice, compression, and elevation.  Participating in physical therapy or home exercises.  Wearing shoe inserts or special shoes.  Losing weight.  Taking medications to reduce pain or swelling or receiving an injection.  Undergoing surgery. HOME CARE INSTRUCTIONS   Only take over-the-counter or prescription medicines for  pain, discomfort, or fever as directed by your caregiver.  Put ice on the injured area.  Put ice in a plastic bag.  Place a towel between your skin and the bag.  Leave the ice on for 15-20 minutes at a time, 03-04 times a day.  Keep your leg raised (elevated) when possible to lessen swelling.  Avoid activities that cause ankle pain.  Follow specific exercises as directed by your caregiver.  Record how often you have ankle pain, the location of the pain, and what it feels like. This information may be helpful to you and your caregiver.  Ask your caregiver about returning to work or sports and whether you should drive.  Follow up with your caregiver for further examination, therapy, or testing as directed. SEEK MEDICAL CARE IF:   Pain or swelling continues or worsens beyond 1 week.  You have an oral temperature above 102 F (38.9 C).  You are feeling unwell or have chills.  You are having an increasingly difficult time with walking.  You have loss of sensation or other new symptoms.  You have questions or concerns. MAKE SURE YOU:   Understand these instructions.  Will watch your condition.  Will get help right away if you are not doing well or get worse. Document Released: 05/05/2010 Document Revised: 02/07/2012 Document Reviewed: 05/05/2010 [facility] Patient Information 2015 [place], [place]. This information is not intended to replace advice given to you by your health care provider. Make sure you discuss any questions you have with your health care provider. Sciatica with Rehab The sciatic nerve runs from the back down the leg and  is responsible for sensation and control of the muscles in the back (posterior) side of the thigh, lower leg, and foot. Sciatica is a condition that is characterized by inflammation of this nerve.  SYMPTOMS   Signs of nerve damage, including numbness and/or weakness along the posterior side of the lower extremity.  Pain in the back of the  thigh that may also travel down the leg.  Pain that worsens when sitting for long periods of time.  Occasionally, pain in the back or buttock. CAUSES  Inflammation of the sciatic nerve is the cause of sciatica. The inflammation is due to something irritating the nerve. Common sources of irritation include:  Sitting for long periods of time.  Direct trauma to the nerve.  Arthritis of the spine.  Herniated or ruptured disk.  Slipping of the vertebrae (spondylolisthesis).  Pressure from soft tissues, such as muscles or ligament-like tissue (fascia). RISK INCREASES WITH:  Sports that place pressure or stress on the spine (football or weightlifting).  Poor strength and flexibility.  Failure to warm up properly before activity.  Family history of low back pain or disk disorders.  Previous back injury or surgery.  Poor body mechanics, especially when lifting, or poor posture. PREVENTION   Warm up and stretch properly before activity.  Maintain physical fitness:  Strength, flexibility, and endurance.  Cardiovascular fitness.  Learn and use proper technique, especially with posture and lifting. When possible, have coach correct improper technique.  Avoid activities that place stress on the spine. PROGNOSIS If treated properly, then sciatica usually resolves within 6 weeks. However, occasionally surgery is necessary.  RELATED COMPLICATIONS   Permanent nerve damage, including pain, numbness, tingle, or weakness.  Chronic back pain.  Risks of surgery: infection, bleeding, nerve damage, or damage to surrounding tissues. TREATMENT Treatment initially involves resting from any activities that aggravate your symptoms. The use of ice and medication may help reduce pain and inflammation. The use of strengthening and stretching exercises may help reduce pain with activity. These exercises may be performed at home or with referral to a therapist. A therapist may recommend further  treatments, such as transcutaneous electronic nerve stimulation (TENS) or ultrasound. Your caregiver may recommend corticosteroid injections to help reduce inflammation of the sciatic nerve. If symptoms persist despite non-surgical (conservative) treatment, then surgery may be recommended. MEDICATION  If pain medication is necessary, then nonsteroidal anti-inflammatory medications, such as aspirin and ibuprofen, or other minor pain relievers, such as acetaminophen, are often recommended.  Do not take pain medication for 7 days before surgery.  Prescription pain relievers may be given if deemed necessary by your caregiver. Use only as directed and only as much as you need.  Ointments applied to the skin may be helpful.  Corticosteroid injections may be given by your caregiver. These injections should be reserved for the most serious cases, because they may only be given a certain number of times. HEAT AND COLD  Cold treatment (icing) relieves pain and reduces inflammation. Cold treatment should be applied for 10 to 15 minutes every 2 to 3 hours for inflammation and pain and immediately after any activity that aggravates your symptoms. Use ice packs or massage the area with a piece of ice (ice massage).  Heat treatment may be used prior to performing the stretching and strengthening activities prescribed by your caregiver, physical therapist, or athletic trainer. Use a heat pack or soak the injury in warm water. SEEK MEDICAL CARE IF:  Treatment seems to offer no benefit, or the  condition worsens.  Any medications produce adverse side effects. EXERCISES  RANGE OF MOTION (ROM) AND STRETCHING EXERCISES - Sciatica Most people with sciatic will find that their symptoms worsen with either excessive bending forward (flexion) or arching at the low back (extension). The exercises which will help resolve your symptoms will focus on the opposite motion. Your physician, physical therapist or athletic  trainer will help you determine which exercises will be most helpful to resolve your low back pain. Do not complete any exercises without first consulting with your clinician. Discontinue any exercises which worsen your symptoms until you speak to your clinician. If you have pain, numbness or tingling which travels down into your buttocks, leg or foot, the goal of the therapy is for these symptoms to move closer to your back and eventually resolve. Occasionally, these leg symptoms will get better, but your low back pain may worsen; this is typically an indication of progress in your rehabilitation. Be certain to be very alert to any changes in your symptoms and the activities in which you participated in the 24 hours prior to the change. Sharing this information with your clinician will allow him/her to most efficiently treat your condition. These exercises may help you when beginning to rehabilitate your injury. Your symptoms may resolve with or without further involvement from your physician, physical therapist or athletic trainer. While completing these exercises, remember:   Restoring tissue flexibility helps normal motion to return to the joints. This allows healthier, less painful movement and activity.  An effective stretch should be held for at least 30 seconds.  A stretch should never be painful. You should only feel a gentle lengthening or release in the stretched tissue. FLEXION RANGE OF MOTION AND STRETCHING EXERCISES: STRETCH - Flexion, Single Knee to Chest   Lie on a firm bed or floor with both legs extended in front of you.  Keeping one leg in contact with the floor, bring your opposite knee to your chest. Hold your leg in place by either grabbing behind your thigh or at your knee.  Pull until you feel a gentle stretch in your low back. Hold __________ seconds.  Slowly release your grasp and repeat the exercise with the opposite side. Repeat __________ times. Complete this exercise  __________ times per day.  STRETCH - Flexion, Double Knee to Chest  Lie on a firm bed or floor with both legs extended in front of you.  Keeping one leg in contact with the floor, bring your opposite knee to your chest.  Tense your stomach muscles to support your back and then lift your other knee to your chest. Hold your legs in place by either grabbing behind your thighs or at your knees.  Pull both knees toward your chest until you feel a gentle stretch in your low back. Hold __________ seconds.  Tense your stomach muscles and slowly return one leg at a time to the floor. Repeat __________ times. Complete this exercise __________ times per day.  STRETCH - Low Trunk Rotation   Lie on a firm bed or floor. Keeping your legs in front of you, bend your knees so they are both pointed toward the ceiling and your feet are flat on the floor.  Extend your arms out to the side. This will stabilize your upper body by keeping your shoulders in contact with the floor.  Gently and slowly drop both knees together to one side until you feel a gentle stretch in your low back. Hold for __________ seconds.  Tense your stomach muscles to support your low back as you bring your knees back to the starting position. Repeat the exercise to the other side. Repeat __________ times. Complete this exercise __________ times per day  EXTENSION RANGE OF MOTION AND FLEXIBILITY EXERCISES: STRETCH - Extension, Prone on Elbows  Lie on your stomach on the floor, a bed will be too soft. Place your palms about shoulder width apart and at the height of your head.  Place your elbows under your shoulders. If this is too painful, stack pillows under your chest.  Allow your body to relax so that your hips drop lower and make contact more completely with the floor.  Hold this position for __________ seconds.  Slowly return to lying flat on the floor. Repeat __________ times. Complete this exercise __________ times per day.   RANGE OF MOTION - Extension, Prone Press Ups  Lie on your stomach on the floor, a bed will be too soft. Place your palms about shoulder width apart and at the height of your head.  Keeping your back as relaxed as possible, slowly straighten your elbows while keeping your hips on the floor. You may adjust the placement of your hands to maximize your comfort. As you gain motion, your hands will come more underneath your shoulders.  Hold this position __________ seconds.  Slowly return to lying flat on the floor. Repeat __________ times. Complete this exercise __________ times per day.  STRENGTHENING EXERCISES - Sciatica  These exercises may help you when beginning to rehabilitate your injury. These exercises should be done near your "sweet spot." This is the neutral, low-back arch, somewhere between fully rounded and fully arched, that is your least painful position. When performed in this safe range of motion, these exercises can be used for people who have either a flexion or extension based injury. These exercises may resolve your symptoms with or without further involvement from your physician, physical therapist or athletic trainer. While completing these exercises, remember:   Muscles can gain both the endurance and the strength needed for everyday activities through controlled exercises.  Complete these exercises as instructed by your physician, physical therapist or athletic trainer. Progress with the resistance and repetition exercises only as your caregiver advises.  You may experience muscle soreness or fatigue, but the pain or discomfort you are trying to eliminate should never worsen during these exercises. If this pain does worsen, stop and make certain you are following the directions exactly. If the pain is still present after adjustments, discontinue the exercise until you can discuss the trouble with your clinician. STRENGTHENING - Deep Abdominals, Pelvic Tilt   Lie on a firm  bed or floor. Keeping your legs in front of you, bend your knees so they are both pointed toward the ceiling and your feet are flat on the floor.  Tense your lower abdominal muscles to press your low back into the floor. This motion will rotate your pelvis so that your tail bone is scooping upwards rather than pointing at your feet or into the floor.  With a gentle tension and even breathing, hold this position for __________ seconds. Repeat __________ times. Complete this exercise __________ times per day.  STRENGTHENING - Abdominals, Crunches   Lie on a firm bed or floor. Keeping your legs in front of you, bend your knees so they are both pointed toward the ceiling and your feet are flat on the floor. Cross your arms over your chest.  Slightly tip your chin down without  bending your neck.  Tense your abdominals and slowly lift your trunk high enough to just clear your shoulder blades. Lifting higher can put excessive stress on the low back and does not further strengthen your abdominal muscles.  Control your return to the starting position. Repeat __________ times. Complete this exercise __________ times per day.  STRENGTHENING - Quadruped, Opposite UE/LE Lift  Assume a hands and knees position on a firm surface. Keep your hands under your shoulders and your knees under your hips. You may place padding under your knees for comfort.  Find your neutral spine and gently tense your abdominal muscles so that you can maintain this position. Your shoulders and hips should form a rectangle that is parallel with the floor and is not twisted.  Keeping your trunk steady, lift your right hand no higher than your shoulder and then your left leg no higher than your hip. Make sure you are not holding your breath. Hold this position __________ seconds.  Continuing to keep your abdominal muscles tense and your back steady, slowly return to your starting position. Repeat with the opposite arm and  leg. Repeat __________ times. Complete this exercise __________ times per day.  STRENGTHENING - Abdominals and Quadriceps, Straight Leg Raise   Lie on a firm bed or floor with both legs extended in front of you.  Keeping one leg in contact with the floor, bend the other knee so that your foot can rest flat on the floor.  Find your neutral spine, and tense your abdominal muscles to maintain your spinal position throughout the exercise.  Slowly lift your straight leg off the floor about 6 inches for a count of 15, making sure to not hold your breath.  Still keeping your neutral spine, slowly lower your leg all the way to the floor. Repeat this exercise with each leg __________ times. Complete this exercise __________ times per day. POSTURE AND BODY MECHANICS CONSIDERATIONS - Sciatica Keeping correct posture when sitting, standing or completing your activities will reduce the stress put on different body tissues, allowing injured tissues a chance to heal and limiting painful experiences. The following are general guidelines for improved posture. Your physician or physical therapist will provide you with any instructions specific to your needs. While reading these guidelines, remember:  The exercises prescribed by your provider will help you have the flexibility and strength to maintain correct postures.  The correct posture provides the optimal environment for your joints to work. All of your joints have less wear and tear when properly supported by a spine with good posture. This means you will experience a healthier, less painful body.  Correct posture must be practiced with all of your activities, especially prolonged sitting and standing. Correct posture is as important when doing repetitive low-stress activities (typing) as it is when doing a single heavy-load activity (lifting). RESTING POSITIONS Consider which positions are most painful for you when choosing a resting position. If you have  pain with flexion-based activities (sitting, bending, stooping, squatting), choose a position that allows you to rest in a less flexed posture. You would want to avoid curling into a fetal position on your side. If your pain worsens with extension-based activities (prolonged standing, working overhead), avoid resting in an extended position such as sleeping on your stomach. Most people will find more comfort when they rest with their spine in a more neutral position, neither too rounded nor too arched. Lying on a non-sagging bed on your side with a pillow between your  knees, or on your back with a pillow under your knees will often provide some relief. Keep in mind, being in any one position for a prolonged period of time, no matter how correct your posture, can still lead to stiffness. PROPER SITTING POSTURE In order to minimize stress and discomfort on your spine, you must sit with correct posture Sitting with good posture should be effortless for a healthy body. Returning to good posture is a gradual process. Many people can work toward this most comfortably by using various supports until they have the flexibility and strength to maintain this posture on their own. When sitting with proper posture, your ears will fall over your shoulders and your shoulders will fall over your hips. You should use the back of the chair to support your upper back. Your low back will be in a neutral position, just slightly arched. You may place a small pillow or folded towel at the base of your low back for support.  When working at a desk, create an environment that supports good, upright posture. Without extra support, muscles fatigue and lead to excessive strain on joints and other tissues. Keep these recommendations in mind: CHAIR:   A chair should be able to slide under your desk when your back makes contact with the back of the chair. This allows you to work closely.  The chair's height should allow your eyes to be  level with the upper part of your monitor and your hands to be slightly lower than your elbows. BODY POSITION  Your feet should make contact with the floor. If this is not possible, use a foot rest.  Keep your ears over your shoulders. This will reduce stress on your neck and low back. INCORRECT SITTING POSTURES   If you are feeling tired and unable to assume a healthy sitting posture, do not slouch or slump. This puts excessive strain on your back tissues, causing more damage and pain. Healthier options include:  Using more support, like a lumbar pillow.  Switching tasks to something that requires you to be upright or walking.  Talking a brief walk.  Lying down to rest in a neutral-spine position. PROLONGED STANDING WHILE SLIGHTLY LEANING FORWARD  When completing a task that requires you to lean forward while standing in one place for a long time, place either foot up on a stationary 2-4 inch high object to help maintain the best posture. When both feet are on the ground, the low back tends to lose its slight inward curve. If this curve flattens (or becomes too large), then the back and your other joints will experience too much stress, fatigue more quickly and can cause pain.  CORRECT STANDING POSTURES Proper standing posture should be assumed with all daily activities, even if they only take a few moments, like when brushing your teeth. As in sitting, your ears should fall over your shoulders and your shoulders should fall over your hips. You should keep a slight tension in your abdominal muscles to brace your spine. Your tailbone should point down to the ground, not behind your body, resulting in an over-extended swayback posture.  INCORRECT STANDING POSTURES  Common incorrect standing postures include a forward head, locked knees and/or an excessive swayback. WALKING Walk with an upright posture. Your ears, shoulders and hips should all line-up. PROLONGED ACTIVITY IN A FLEXED  POSITION When completing a task that requires you to bend forward at your waist or lean over a low surface, try to find a way  to stabilize 3 of 4 of your limbs. You can place a hand or elbow on your thigh or rest a knee on the surface you are reaching across. This will provide you more stability so that your muscles do not fatigue as quickly. By keeping your knees relaxed, or slightly bent, you will also reduce stress across your low back. CORRECT LIFTING TECHNIQUES DO :   Assume a wide stance. This will provide you more stability and the opportunity to get as close as possible to the object which you are lifting.  Tense your abdominals to brace your spine; then bend at the knees and hips. Keeping your back locked in a neutral-spine position, lift using your leg muscles. Lift with your legs, keeping your back straight.  Test the weight of unknown objects before attempting to lift them.  Try to keep your elbows locked down at your sides in order get the best strength from your shoulders when carrying an object.  Always ask for help when lifting heavy or awkward objects. INCORRECT LIFTING TECHNIQUES DO NOT:   Lock your knees when lifting, even if it is a small object.  Bend and twist. Pivot at your feet or move your feet when needing to change directions.  Assume that you cannot safely pick up a paperclip without proper posture. Document Released: 11/15/2005 Document Revised: 04/01/2014 Document Reviewed: 02/27/2009 [facility] Patient Information 2015 [place], [place]. This information is not intended to replace advice given to you by your health care provider. Make sure you discuss any questions you have with your health care provider.

## 2015-06-02 NOTE — ED Provider Notes (Signed)
CSN: [id number]     Arrival date & time 06/02/15  1219 History  This chart was scribed for non-physician practitioner, Montine Circle, PA-C, working with Orlie Dakin, MD by Ladene Artist, ED Scribe. This patient was seen in room TR08C/TR08C and the patient's care was started at 1:01 PM.   Chief Complaint  Patient presents with  . Back Pain  . Ankle Pain   The history is provided by the patient. No language interpreter was used.   HPI Comments: Meagan Flores is a 49 y.o. female who presents to the Emergency Department with a chief complaint of constant, gradually worsening low back pain that radiates into buttocks and bilateral lower extremities for the past 2 days. Pt states that she was sitting for a few hours 2 days ago and heard a "pop" in her back when attempting to stand. Pain is exacerbated with coughing and sitting. Pt has tried Advil, Aleve without relief. She denies bladder or bowel incontinence.   Pt also presents with sudden onset of right ankle pain for the past 4 days. She reports associated swelling upon waking one morning. She denies injury. Pt is able to self-ambulate but reports increased pain with ambulation. She has tried soaking her foot and ace bandages without relief.   Past Medical History  Diagnosis Date  . Thyroid disease    Past Surgical History  Procedure Laterality Date  . Thyroidectomy    . Partial hysterectomy     No family history on file. History  Substance Use Topics  . Smoking status: Current Every Day Smoker -- 0.50 packs/day  . Smokeless tobacco: Not on file  . Alcohol Use: Yes     Comment: occassional   OB History    Gravida Para Term Preterm AB TAB SAB Ectopic Multiple Living   3 3 3  0 0 0 0 0 0 3     Review of Systems  Constitutional: Negative for fever.  Musculoskeletal: Positive for back pain, joint swelling and arthralgias.   Allergies  Sulfa antibiotics  Home Medications   Prior to Admission medications   Medication Sig Start  Date End Date Taking? Authorizing Provider  aspirin-sod bicarb-citric acid (ALKA-SELTZER) 325 MG TBEF tablet Take 325 mg by mouth every 6 (six) hours as needed (cold symptoms).    Historical Provider, MD  guaiFENesin (MUCINEX) 600 MG 12 hr tablet Take 600 mg by mouth 2 (two) times daily as needed for cough (congestion).    Historical Provider, MD  levothyroxine (SYNTHROID, LEVOTHROID) 125 MCG tablet Take 1 tablet (125 mcg total) by mouth daily before breakfast. 01/28/14   Reyne Dumas, MD  naproxen sodium (ANAPROX) 220 MG tablet Take 440 mg by mouth daily as needed (for pain).    Historical Provider, MD  nitroGLYCERIN (NITRODUR - DOSED IN MG/24 HR) 0.2 mg/hr patch Cut and apply 1/4 patch to most painful area q24h. Patient not taking: Reported on 02/20/2015 01/24/15   Hilton Sinclair, MD  ondansetron (ZOFRAN ODT) 4 MG disintegrating tablet 4mg  ODT q4 hours prn nausea/vomit 02/21/15   Etta Quill, NP   BP 128/80 mmHg  Pulse 75  Temp(Src) 98.3 F (36.8 C) (Oral)  Resp 16  Ht 5\' 8"  (1.727 m)  Wt 202 lb (91.627 kg)  BMI 30.72 kg/m2  SpO2 97% Physical Exam  Constitutional: She is oriented to person, place, and time. She appears well-developed and well-nourished. No distress.  HENT:  Head: Normocephalic and atraumatic.  Eyes: Conjunctivae and EOM are normal. Right eye exhibits no  discharge. Left eye exhibits no discharge. No scleral icterus.  Neck: Normal range of motion. Neck supple. No tracheal deviation present.  Cardiovascular: Normal rate, regular rhythm and normal heart sounds.  Exam reveals no gallop and no friction rub.   No murmur heard. Pulmonary/Chest: Effort normal and breath sounds normal. No respiratory distress. She has no wheezes.  Abdominal: Soft. She exhibits no distension. There is no tenderness.  Musculoskeletal: Normal range of motion.  Bilateral lumbar paraspinal muscles tender to palpation, no bony tenderness, step-offs, or gross abnormality or deformity of spine, patient  is able to ambulate, moves all extremities  Bilateral great toe extension intact Bilateral plantar/dorsiflexion intact  Right ankle ttp over the CFL, no bony abnormality or deformity, patient able to ambulate  Neurological: She is alert and oriented to person, place, and time.  Sensation and strength intact bilaterally   Skin: Skin is warm and dry. She is not diaphoretic.  Psychiatric: She has a normal mood and affect. Her behavior is normal. Judgment and thought content normal.  Nursing note and vitals reviewed.  ED Course  Procedures (including critical care time) DIAGNOSTIC STUDIES: Oxygen Saturation is 97% on RA, normal by my interpretation.    COORDINATION OF CARE: 1:06 PM-Discussed treatment plan which includes Norco and flexeril with pt at bedside and pt agreed to plan.   Labs Review Labs Reviewed - No data to display  Imaging Review No results found.   EKG Interpretation None      MDM   Final diagnoses:  Bilateral low back pain with sciatica, sciatica laterality unspecified  Ankle pain, right    Patient with back pain.  No neurological deficits and normal neuro exam.  Patient is ambulatory.  No loss of bowel or bladder control.  Doubt cauda equina.  Denies fever,  doubt epidural abscess or other lesion. Recommend back exercises, stretching, RICE, and will treat with a short course of norco.  Encouraged the patient that there could be a need for additional workup and/or imaging such as MRI, if the symptoms do not resolve. Patient advised that if the back pain does not resolve, or radiates, this could progress to more serious conditions and is encouraged to follow-up with PCP or orthopedics within 2 weeks.     I personally performed the services described in this documentation, which was scribed in my presence. The recorded information has been reviewed and is accurate.      Montine Circle, PA-C 06/02/15 [place], MD 06/02/15 [phone]

## 2015-06-02 NOTE — ED Notes (Signed)
Patient states low back pain with radiation down legs.   Patient states R ankle pain that has been giving her problems for awhile.   Denies injury.

## 2015-09-04 ENCOUNTER — Encounter (HOSPITAL_COMMUNITY): Payer: Self-pay | Admitting: *Deleted

## 2015-09-04 ENCOUNTER — Emergency Department (HOSPITAL_COMMUNITY)
Admission: EM | Admit: 2015-09-04 | Discharge: 2015-09-04 | Disposition: A | Discharge status: Home / Self Care | Payer: BLUE CROSS/BLUE SHIELD | Attending: Emergency Medicine | Admitting: Emergency Medicine

## 2015-09-04 DIAGNOSIS — M25571 Pain in right ankle and joints of right foot: Secondary | ICD-10-CM | POA: Insufficient documentation

## 2015-09-04 DIAGNOSIS — Z79899 Other long term (current) drug therapy: Secondary | ICD-10-CM | POA: Insufficient documentation

## 2015-09-04 DIAGNOSIS — E079 Disorder of thyroid, unspecified: Secondary | ICD-10-CM | POA: Insufficient documentation

## 2015-09-04 DIAGNOSIS — Z7982 Long term (current) use of aspirin: Secondary | ICD-10-CM | POA: Insufficient documentation

## 2015-09-04 DIAGNOSIS — Z720 Tobacco use: Secondary | ICD-10-CM | POA: Diagnosis not present

## 2015-09-04 DIAGNOSIS — M7751 Other enthesopathy of right foot: Secondary | ICD-10-CM

## 2015-09-04 MED ORDER — HYDROMORPHONE HCL 1 MG/ML IJ SOLN
1.0000 mg | Freq: Once | INTRAMUSCULAR | Status: DC
  Filled 2015-09-04: qty 1

## 2015-09-04 MED ORDER — OXYCODONE-ACETAMINOPHEN 5-325 MG PO TABS
1.0000 | ORAL_TABLET | Freq: Once | ORAL | Status: AC
  Administered 2015-09-04: 1 via ORAL
  Filled 2015-09-04: qty 1

## 2015-09-04 MED ORDER — IBUPROFEN 600 MG PO TABS: 600.0000 mg | ORAL_TABLET | Freq: Three times a day (TID) | ORAL | Status: DC

## 2015-09-04 MED ORDER — TRAMADOL HCL 50 MG PO TABS: 50.0000 mg | ORAL_TABLET | Freq: Four times a day (QID) | ORAL | Status: DC | PRN

## 2015-09-04 MED ORDER — KETOROLAC TROMETHAMINE 60 MG/2ML IM SOLN
60.0000 mg | Freq: Once | INTRAMUSCULAR | Status: DC
  Filled 2015-09-04: qty 2

## 2015-09-04 NOTE — ED Notes (Signed)
Pt was seen here 06/01/15 and her right ankle had swelled.  Pt was given pain pills and muscle relaxers and put her out of work.  Pt states pain in right ankle for the last 2 weeks

## 2015-09-04 NOTE — ED Provider Notes (Signed)
CSN: [id number]   Arrival date & time 09/04/15 1654  History  By signing my name below, I, Meagan Flores, attest that this documentation has been prepared under the direction and in the presence of Merrily Pew, MD. Electronically Signed: Altamease Flores, ED Scribe. 09/04/2015. 5:11 PM.  Chief Complaint  Patient presents with  . Ankle Pain    HPI The history is provided by the patient. No language interpreter was used.   Meagan Flores is a 49 y.o. female who presents to the Emergency Department complaining of recurrent and atraumatic right lateral ankle pain with onset 2 weeks ago. She describes the constant pain as throbbing and rates it 10/10 in severity. Bearing weight and palpation of the area exacerbate the pain.  She was seen in the ED on 06/02/15 for similar pain and discharged with pain medication and a muscle relaxer that was effective at the time. She has been wearing a brace without relief in pain. Pt did not see her Sports Medicine doctor for the pain.   Past Medical History  Diagnosis Date  . Thyroid disease     Past Surgical History  Procedure Laterality Date  . Thyroidectomy    . Partial hysterectomy    . Right arm sx      No family history on file.  Social History  Substance Use Topics  . Smoking status: Current Every Day Smoker -- 0.50 packs/day  . Smokeless tobacco: None  . Alcohol Use: Yes     Comment: occassional     Review of Systems  Musculoskeletal:       Right ankle pain and swelling   Home Medications   Prior to Admission medications   Medication Sig Start Date End Date Taking? Authorizing Provider  aspirin-sod bicarb-citric acid (ALKA-SELTZER) 325 MG TBEF tablet Take 325 mg by mouth every 6 (six) hours as needed (cold symptoms).    Historical Provider, MD  cyclobenzaprine (FLEXERIL) 10 MG tablet Take 1 tablet (10 mg total) by mouth 2 (two) times daily as needed for muscle spasms. 06/02/15   Montine Circle, PA-C  guaiFENesin (MUCINEX) 600 MG 12 hr  tablet Take 600 mg by mouth 2 (two) times daily as needed for cough (congestion).    Historical Provider, MD  HYDROcodone-acetaminophen (NORCO/VICODIN) 5-325 MG per tablet Take 2 tablets by mouth every 4 (four) hours as needed. 06/02/15   Montine Circle, PA-C  ibuprofen (ADVIL,MOTRIN) 600 MG tablet Take 1 tablet (600 mg total) by mouth 3 (three) times daily. 09/04/15   Merrily Pew, MD  levothyroxine (SYNTHROID, LEVOTHROID) 125 MCG tablet Take 1 tablet (125 mcg total) by mouth daily before breakfast. 01/28/14   Reyne Dumas, MD  naproxen sodium (ANAPROX) 220 MG tablet Take 440 mg by mouth daily as needed (for pain).    Historical Provider, MD  nitroGLYCERIN (NITRODUR - DOSED IN MG/24 HR) 0.2 mg/hr patch Cut and apply 1/4 patch to most painful area q24h. Patient not taking: Reported on 02/20/2015 01/24/15   Hilton Sinclair, MD  ondansetron [facility] ODT) 4 MG disintegrating tablet 4mg  ODT q4 hours prn nausea/vomit 02/21/15   Etta Quill, NP  traMADol (ULTRAM) 50 MG tablet Take 1 tablet (50 mg total) by mouth every 6 (six) hours as needed. 09/04/15   Merrily Pew, MD    Allergies  Sulfa antibiotics  Triage Vitals: BP 129/78 mmHg  Pulse 98  Temp(Src) 98.3 F (36.8 C) (Oral)  Resp 18  SpO2 96%  Physical Exam  Constitutional: She is oriented to person,  place, and time. She appears well-developed and well-nourished. No distress.  HENT:  Head: Normocephalic and atraumatic.  Eyes: Conjunctivae and EOM are normal.  Neck: Neck supple. No tracheal deviation present.  Cardiovascular: Normal rate.   Pulmonary/Chest: Effort normal. No respiratory distress.  Musculoskeletal: Normal range of motion.  Minimal swelling to right lateral malleolus with no pain Brisk cap refill 1+ DP pulse Normal sensation throughout the foot No tenderness to lateral compression of tendons  Neurological: She is alert and oriented to person, place, and time.  Skin: Skin is warm and dry.  Psychiatric: She has a normal mood  and affect. Her behavior is normal.  Nursing note and vitals reviewed.   ED Course  Procedures   DIAGNOSTIC STUDIES: Oxygen Saturation is 96% on RA, normal by my interpretation.    COORDINATION OF CARE: 5:08 PM Discussed treatment plan which includes pain management with pt at bedside and pt agreed to plan.  Labs Reviewed - No data to display  Imaging Review No results found.     MDM   Final diagnoses:  Right ankle tendonitis   Here with right ankle pain has flared up her last weeks. Had a similar episode a few months ago was treated successfully with some pain medicine. She never followed up in reference to this. She has seen sports medicine in the past and can follow-up for this ankle pain. She hasn't had any new trauma, fevers erythema or other symptoms concerning for infection. She will bear weight with pain. Negative on [place] ankle rules so will not x-ray at this time. We'll treat symptomatically and have her follow-up with sports medicine in a week if not improving.  I have personally and contemperaneously reviewed labs and imaging and used in my decision making as above.   A medical screening exam was performed and I feel the patient has had an appropriate workup for their chief complaint at this time and likelihood of emergent condition existing is low. They have been counseled on decision, discharge, follow up and which symptoms necessitate immediate return to the emergency department. They or their family verbally stated understanding and agreement with plan and discharged in stable condition.    I personally performed the services described in this documentation, which was scribed in my presence. The recorded information has been reviewed and is accurate.    Merrily Pew, MD 09/04/15 1755

## 2015-09-07 ENCOUNTER — Emergency Department (HOSPITAL_COMMUNITY)
Admission: EM | Admit: 2015-09-07 | Discharge: 2015-09-07 | Disposition: A | Discharge status: Home / Self Care | Payer: BLUE CROSS/BLUE SHIELD | Attending: Emergency Medicine | Admitting: Emergency Medicine

## 2015-09-07 ENCOUNTER — Encounter (HOSPITAL_COMMUNITY): Payer: Self-pay | Admitting: *Deleted

## 2015-09-07 ENCOUNTER — Emergency Department (HOSPITAL_COMMUNITY): Payer: BLUE CROSS/BLUE SHIELD

## 2015-09-07 DIAGNOSIS — R0602 Shortness of breath: Secondary | ICD-10-CM | POA: Diagnosis not present

## 2015-09-07 DIAGNOSIS — E079 Disorder of thyroid, unspecified: Secondary | ICD-10-CM | POA: Diagnosis not present

## 2015-09-07 DIAGNOSIS — Z79899 Other long term (current) drug therapy: Secondary | ICD-10-CM | POA: Diagnosis not present

## 2015-09-07 DIAGNOSIS — R079 Chest pain, unspecified: Secondary | ICD-10-CM

## 2015-09-07 DIAGNOSIS — R112 Nausea with vomiting, unspecified: Secondary | ICD-10-CM | POA: Diagnosis not present

## 2015-09-07 DIAGNOSIS — Z720 Tobacco use: Secondary | ICD-10-CM | POA: Diagnosis not present

## 2015-09-07 LAB — BASIC METABOLIC PANEL
Anion gap: 8 (ref 5–15)
BUN: 8 mg/dL (ref 6–20)
CHLORIDE: 102 mmol/L (ref 101–111)
CO2: 26 mmol/L (ref 22–32)
Calcium: 7.8 mg/dL — ABNORMAL LOW (ref 8.9–10.3)
Creatinine, Ser: 0.66 mg/dL (ref 0.44–1.00)
GFR calc Af Amer: >60 mL/min (ref >60)
GLUCOSE: 94 mg/dL (ref 65–99)
POTASSIUM: 3.5 mmol/L (ref 3.5–5.1)
Sodium: 136 mmol/L (ref 135–145)

## 2015-09-07 LAB — CBC
HEMATOCRIT: 41.7 % (ref 36.0–46.0)
Hemoglobin: 13.6 g/dL (ref 12.0–15.0)
MCH: 24.1 pg — AB (ref 26.0–34.0)
MCHC: 32.6 g/dL (ref 30.0–36.0)
MCV: 73.8 fL — AB (ref 78.0–100.0)
Platelets: 223 10*3/uL (ref 150–400)
RBC: 5.65 MIL/uL — ABNORMAL HIGH (ref 3.87–5.11)
RDW: 15.1 % (ref 11.5–15.5)
WBC: 7.3 10*3/uL (ref 4.0–10.5)

## 2015-09-07 LAB — TROPONIN I: Troponin I: <0.03 ng/mL (ref <0.031)

## 2015-09-07 MED ORDER — GI COCKTAIL ~~LOC~~
30.0000 mL | Freq: Once | ORAL | Status: AC
  Administered 2015-09-07: 30 mL via ORAL
  Filled 2015-09-07: qty 30

## 2015-09-07 NOTE — ED Provider Notes (Signed)
CSN: [id number]     Arrival date & time 09/07/15  2030 History   First MD Initiated Contact with Patient 09/07/15 2128     Chief Complaint  Patient presents with  . Chest Pain    The history is provided by the patient and medical records.     49 year old female with history of thyroid disease status post thyroidectomy presenting with chest pain. Onset was this morning, when awakening from sleep. Located in the center of chest. Constant since then. Nonradiating. Described as throbbing, sharp, pressure. Worse with deep inspiration. Not alleviated by rest. Moderate severity. Denies hx CAD, HTN, HLD, DM, but is a smoker. Associated sx include nausea, NBNB emesis earlier in morning, feeling of shortness of breath due to pain on inspiration. No cough, fevers, diaphoresis, new leg swelling, hx DVT/PE.    Past Medical History  Diagnosis Date  . Thyroid disease    Past Surgical History  Procedure Laterality Date  . Thyroidectomy    . Partial hysterectomy    . Right arm sx     No family history on file. Social History  Substance Use Topics  . Smoking status: Current Every Day Smoker -- 0.50 packs/day  . Smokeless tobacco: None  . Alcohol Use: Yes     Comment: occassional   OB History    Gravida Para Term Preterm AB TAB SAB Ectopic Multiple Living   3 3 3  0 0 0 0 0 0 3     Review of Systems  Constitutional: Negative for fever.  HENT: Negative for rhinorrhea.   Eyes: Negative for visual disturbance.  Respiratory: Positive for shortness of breath.   Cardiovascular: Positive for chest pain. Negative for leg swelling.  Gastrointestinal: Positive for nausea and vomiting. Negative for abdominal pain and diarrhea.  Genitourinary: Negative for decreased urine volume.  Skin: Negative for rash.  Allergic/Immunologic: Negative for immunocompromised state.  Neurological: Negative for syncope.  Psychiatric/Behavioral: Negative for confusion.      Allergies  Sulfa antibiotics  Home  Medications   Prior to Admission medications   Medication Sig Start Date End Date Taking? Authorizing Provider  HYDROcodone-acetaminophen (NORCO/VICODIN) 5-325 MG per tablet Take 2 tablets by mouth every 4 (four) hours as needed. 06/02/15  Yes Montine Circle, PA-C  levothyroxine (SYNTHROID, LEVOTHROID) 125 MCG tablet Take 1 tablet (125 mcg total) by mouth daily before breakfast. 01/28/14  Yes Reyne Dumas, MD  cyclobenzaprine (FLEXERIL) 10 MG tablet Take 1 tablet (10 mg total) by mouth 2 (two) times daily as needed for muscle spasms. Patient not taking: Reported on 09/07/2015 06/02/15   Montine Circle, PA-C  ibuprofen (ADVIL,MOTRIN) 600 MG tablet Take 1 tablet (600 mg total) by mouth 3 (three) times daily. 09/04/15   Merrily Pew, MD  nitroGLYCERIN (NITRODUR - DOSED IN MG/24 HR) 0.2 mg/hr patch Cut and apply 1/4 patch to most painful area q24h. Patient not taking: Reported on 02/20/2015 01/24/15   Hilton Sinclair, MD  ondansetron (ZOFRAN ODT) 4 MG disintegrating tablet 4mg  ODT q4 hours prn nausea/vomit Patient not taking: Reported on 09/07/2015 02/21/15   Etta Quill, NP  traMADol (ULTRAM) 50 MG tablet Take 1 tablet (50 mg total) by mouth every 6 (six) hours as needed. 09/04/15   Merrily Pew, MD   BP 128/90 mmHg  Pulse 70  Temp(Src) 97.8 F (36.6 C) (Oral)  Resp 14  Ht 5\' 8"  (1.727 m)  Wt 204 lb (92.534 kg)  BMI 31.03 kg/m2  SpO2 99% Physical Exam  Constitutional: She is oriented  to person, place, and time. She appears well-developed and well-nourished. No distress.  HENT:  Head: Normocephalic and atraumatic.  Eyes: Right eye exhibits no discharge. Left eye exhibits no discharge.  Neck: No tracheal deviation present.  Cardiovascular: Normal rate and regular rhythm.   Pulmonary/Chest: Effort normal and breath sounds normal. No respiratory distress. She exhibits no tenderness.  Abdominal: Soft. She exhibits no distension. There is no tenderness.  Musculoskeletal: She exhibits no edema.   Neurological: She is alert and oriented to person, place, and time.  Skin: Skin is warm and dry.  Psychiatric: She has a normal mood and affect. Her behavior is normal.  Nursing note and vitals reviewed.   ED Course  Procedures (including critical care time) Labs Review Labs Reviewed  BASIC METABOLIC PANEL - Abnormal; Notable for the following:    Calcium 7.8 (*)    All other components within normal limits  CBC - Abnormal; Notable for the following:    RBC 5.65 (*)    MCV 73.8 (*)    MCH 24.1 (*)    All other components within normal limits  TROPONIN I    Imaging Review Dg Chest 2 View  09/07/2015   CLINICAL DATA:  49 year old female with midsternal chest pain worse with breathing and movement. Shortness of breath for 1 day. Initial encounter. Smoker.  EXAM: CHEST  2 VIEW  COMPARISON:  02/20/2015 and earlier.  FINDINGS: Two superior median sternotomy wires are stable. Stable cardiac size at the upper limits of normal. Other mediastinal contours are within normal limits. Visualized tracheal air column is within normal limits. Incidental lower cervical and upper thoracic spina bifida occulta. Lung volumes are stable and within normal limits. No pneumothorax, pulmonary edema, pleural effusion or confluent pulmonary opacity. No acute osseous abnormality identified.  IMPRESSION: No acute cardiopulmonary abnormality.   Electronically Signed   By: Genevie Ann M.D.   On: 09/07/2015 21:49   I have personally reviewed and evaluated these images and lab results as part of my medical decision-making.   EKG Interpretation   Date/Time:  Sunday September 07 2015 20:36:34 EDT Ventricular Rate:  70 PR Interval:  180 QRS Duration: 90 QT Interval:  442 QTC Calculation: 477 R Axis:   -77 Text Interpretation:  Normal sinus rhythm Left anterior fascicular block  Abnormal ECG Confirmed by ZAVITZ  MD, JOSHUA ([id number]) on 09/07/2015 11:10:05  PM      MDM   Final diagnoses:  Chest pain, unspecified  chest pain type    49 year old female with history of thyroid disease status post thyroidectomy presenting with chest pain. AF, VSS. Atypical story. GI cause seems most likely with associated nausea and vomiting episode.   No concern for PE, low risk per [facility]. No PTX or PNA.   Low concern for ACS, HEART score 2, and feel particularly unlikely given neg troponin and no acute concerning EKG findings after chest pain has been present over 12 hours. Shared decision making with patient regarding utility of obtaining second troponin. Patient requests discharge home. Given GI cocktail. Discharged home in stable condition with return precautions, outpatient follow-up.  Case discussed with Dr. Reather Converse, who oversaw management of this patient.     Ivin Booty, MD 09/08/15 3810  Elnora Morrison, MD 09/10/15 2010

## 2015-09-07 NOTE — ED Notes (Signed)
Pt to be transported from xray to room./

## 2015-09-07 NOTE — Discharge Instructions (Signed)
°Emergency Department Resource Guide °1) Find a Doctor and Pay Out of Pocket °Although you won't have to find out who is covered by your insurance plan, it is a good idea to ask around and get recommendations. You will then need to call the office and see if the doctor you have chosen will accept you as a new patient and what types of options they offer for patients who are self-pay. Some doctors offer discounts or will set up payment plans for their patients who do not have insurance, but you will need to ask so you aren't surprised when you get to your appointment. ° °2) Contact Your Local Health Department °Not all health departments have doctors that can see patients for sick visits, but many do, so it is worth a call to see if yours does. If you don't know where your local health department is, you can check in your phone book. The CDC also has a tool to help you locate your state's health department, and many state websites also have listings of all of their local health departments. ° °3) Find a Walk-in Clinic °If your illness is not likely to be very severe or complicated, you may want to try a walk in clinic. These are popping up all over the country in pharmacies, drugstores, and shopping centers. They're usually staffed by nurse practitioners or physician assistants that have been trained to treat common illnesses and complaints. They're usually fairly quick and inexpensive. However, if you have serious medical issues or chronic medical problems, these are probably not your best option. ° °No Primary Care Doctor: °- Call [facility] at  832-8000 - they can help you locate a primary care doctor that  accepts your insurance, provides certain services, etc. °- Physician Referral Service- 1-800-533-3463 ° °Chronic Pain Problems: °Organization         Address  Phone   Notes  °[facility]  (336) 297-2271 Patients need to be referred by their primary care doctor.  ° °Medication  Assistance: °Organization         Address  Phone   Notes  °[place] [facility] [address] °[place], [place] [address] (336) 641-8030 --Must be a resident of [place] °-- Must have NO insurance coverage whatsoever (no Medicaid/ Medicare, etc.) °-- The pt. MUST have a primary care doctor that directs their care regularly and follows them in the community °  °[facility]  (866) 331-1348   °[employer]  (888) 892-1162   ° °Agencies that provide inexpensive medical care: °Organization         Address  Phone   Notes  °[facility]  (336) 832-8035   °[facility]    (336) 832-7272   °[facility] [address] °[place],  [address] (336) 832-4777   °[facility] [address], °[place] (336) 271-4999   °[facility]    (336) 373-0678   °[facility]    (336) 272-1050   °[facility] ° [address], [place] Phone:  (336) 832-4444, Fax:  (336) 832-4440 Hours of Operation:  9 am - 6 pm, M-F.  Also accepts Medicaid/Medicare and self-pay.  °[facility] ° [address],  Phone: (336) 832-3150, Fax: (336) 832-3151. Hours of Operation:  8:30 am - 5:30 pm, M-F.  Also accepts Medicaid and self-pay.  °[facility] [place] [address], [place] Phone: (336) 878-6027   °[facility] [address], [place], [place] (336)723-1848, Ext. 123 Mondays & Thursdays: 7-9 AM.  First 15 patients are seen on a first come, first serve basis. °  ° °Medicaid-accepting [place] Providers: ° °Organization         Address  Phone   Notes  °[facility] [address], [place] (336) 641-2100 Also accepts self-pay patients.  °[facility] [address], [place] ° (336) 856-9996   °[facility] [address], [place]  (336) 288-8857   °[facility] [address] [place] [address], [place] (336) 299-7000   °Veita Bland [address], [place]  ° (336) 373-1557 Only accepts [place] Access Medicaid patients after they have their name applied to their card.  ° °Self-Pay (no insurance) in [place]: ° °Organization         Address  Phone   Notes  °Sickle Cell Patients, [facility] [address], [place] (336) 832-1970   °[facility] [address], [place] (336) 832-4400   °[facility] ° [address], [place] (336) 992-4800   °[facility]/Dr. Osei-Bonsu ° [address] [place] [address], [place] or [address], [place] (336) 841-8500 Phone number for both [place] and [place] locations is the same.  °[facility] [address], [place] (336) 299-0000   °[facility] [address] [place] [address], [place] or [address] (336) 852-7530 °(336) 878-2260   °[facility] [address], [place] (336) 350-1642, phone; (336) 294-5005, fax Sees patients 1st and 3rd Saturday of every month.  Must not qualify for public or private insurance (i.e. Medicaid, Medicare, [place] Health Choice, Veterans' Benefits) • Household income should be no more than 200% of the poverty level •The clinic cannot treat you if you are pregnant or think you are pregnant • Sexually transmitted diseases are not treated at the clinic.  ° ° °Dental Care: °Organization         Address  Phone  Notes  °[place] [facility] [address],  (336) 641-6152 Accepts children up to age 21 who are enrolled in Medicaid or [place] Health Choice; pregnant women with a Medicaid card; and children who have applied for Medicaid or [place] Health Choice, but were declined, whose parents can pay a reduced fee at time of service.  °[place]  [facility] [place]  [address], [place] (336) 641-7733 Accepts children up to age 21 who are enrolled in Medicaid or [place] Health Choice; pregnant women with a Medicaid card; and children who have applied for Medicaid or [place] Health Choice, but were declined, whose parents can pay a reduced fee at time of service.  °[facility] ° [address],  (336) 641-4533 Patients are seen by appointment only. Walk-ins are not accepted. [facility] will see patients 18 years of age and older. °Monday - Tuesday (8am-5pm) °Most Wednesdays (8:30-5pm) °$30 per visit, cash only  °[facility] ° [address], [place] (336) 641-4533 Patients are seen by appointment only. Walk-ins are not accepted. [facility] will see patients 18 years of age and older. °One   Wednesday Evening (Monthly: Volunteer Based).  $30 per visit, cash only  °[facility]  (919) 537-3737 for adults; Children under age 4, call Graduate Pediatric Dentistry at (919) [phone]. Children aged 4-14, please call (919) 537-3737 to request a pediatric application. ° Dental services are provided in all areas of dental care including fillings, crowns and bridges, complete and partial dentures, implants, gum treatment, root canals, and extractions. Preventive care is also provided. Treatment is provided to both adults and children. °Patients are selected via a lottery and there is often a waiting list. °  °[facility] [address], °[place] ° (336) 763-8833 [email or web address] °  °[facility] [address], [place], [place] (336)723-1848, Ext. 123 Second and Fourth Thursday of each month, opens at 6:30 AM; Clinic ends at 9 AM.  Patients are seen on a first-come first-served basis, and a limited number are seen during each clinic.  ° °[facility] ° [address], [place], [place] (336) 723-7904    Eligibility Requirements °You must have lived in [place], or [place] counties for at least the last three months. °  You cannot be eligible for state or federal sponsored healthcare insurance, including Veterans Administration, Medicaid, or Medicare. °  You generally cannot be eligible for healthcare insurance through your employer.  °  How to apply: °Eligibility screenings are held every Tuesday and Wednesday afternoon from 1:00 pm until 4:00 pm. You do not need an appointment for the interview!  °[facility] [address], [place], [place] 336-631-2330   °[facility]  336-342-8273   °[facility]  336-703-3100   °[facility]  336-570-6415   ° °Behavioral Health Resources in the Community: °Intensive Outpatient Programs °Organization         Address  Phone  Notes  °[facility] [address], [place], [place] 336-878-6098   °[facility] [address], [place], [place] 336-832-9800   °[facility] [address], [place], [place] ° 336-882-2125   °[facility] [address],  °[place], [place] 1-800-853-5163 or 336-641-4981   °Substance Abuse Resources °Organization         Address  Phone  Notes  °[facility]  336-882-2125   °[facility]  336-784-9470   °[facility]  336-285-9073   °[facility]  336-845-3988   °Residential & Outpatient Substance Abuse Program  1-800-659-3381   °Psychological Services °Organization         Address  Phone  Notes  °[facility]  336- 832-9600   °[facility]  336- 378-7881   °[facility] [address], [place] 1-800-853-5163 or 336-641-4981   ° °Mobile Crisis Teams °Organization         Address  Phone  Notes  °[facility], Mobile Crisis Care Unit  1-877-626-1772   °Assertive °[facility] ° [address]  [place], [place] 336-834-9664   °Sharon DeEsch [address] °[place] 336-554-5454   ° °Self-Help/Support Groups °Organization         Address  Phone             Notes  °[facility] - variety of support groups  336- 373-1402 Call for more information  °Narcotics Anonymous (NA), [facility] [address], °[place]  2 meetings at this location  ° °  Residential Treatment Programs °Organization         Address  Phone  Notes  °[facility] [address],    °[place]  1-866-801-8205   °[facility] ° [address], [place], [place] 704-293-8524   °[facility] [address], [place] 336-845-3988 Admissions: 8am-3pm M-F  °[facility] [address]    °[place], [place] 336-841-1104   °[facility] [address], [place], [place] 336-379-7146   °[facility] [address]  °[place], [place] 336-285-9073   °[facility] Intensive Outpatient [address], [place], [place] 336-852-3033   °[facility] [address]  °[place], [place] 1-877-615-2722 or 336-784-9470   °[facility] [address] [place], [place] 336-227-7417 Accepts Medicaid  °[facility] [address]  °[place] 1-800-659-3381 Substance Abuse/Addiction Treatment  ° °[place] Behavioral Health Resources °Organization         Address  Phone  Notes  °[facility]  (888) 581-9988   °Julie Brannon, PhD [address] [place], [place]   (336) 349-5553 or (336) 951-0000   °[facility]   [address] °[place], [place] (336) 349-4454   °[facility] [address], [place], [place] (336) 342-8316 Insurance/Medicaid/sponsorship through [facility]  °[facility] [address]                                    [place], [place] (336) 342-8316 Therapy/tele-psych/case    °[facility] [address]  ° [place], [place] (336) 349-2233    °Dr. Arfeen  (336) 349-4544   °[facility] [place]  [employer] [place] [facility] 1) [address],  °2) [address], [place] °3)  [address], [place] (336) 349-3220 °(336) 342-7768 ° °(336) 342-8140   °[place] [facility] (336) 342-1394 or (336) 342-3537 (After Hours)    ° ° °

## 2015-09-07 NOTE — ED Notes (Signed)
The pt is unhappily waiting to be given info about her lab tests

## 2015-09-07 NOTE — ED Notes (Signed)
The pt is c/o mid-chest pain all day  With n v.   Her pain increases with movement and respirations.  lmp none

## 2015-09-07 NOTE — ED Notes (Signed)
Pt family member at nurses station asking for update, this RN informed pt that she would talk with Dr. Amedeo Gory once she was finished in other pt room. Pt in nad.

## 2015-09-12 ENCOUNTER — Encounter: Payer: Self-pay | Admitting: Sports Medicine

## 2015-09-12 ENCOUNTER — Ambulatory Visit
Admission: RE | Admit: 2015-09-12 | Discharge: 2015-09-12 | Disposition: A | Discharge status: Home / Self Care | Payer: BLUE CROSS/BLUE SHIELD | Source: Ambulatory Visit | Attending: Sports Medicine | Admitting: Sports Medicine

## 2015-09-12 ENCOUNTER — Ambulatory Visit (INDEPENDENT_AMBULATORY_CARE_PROVIDER_SITE_OTHER): Payer: BLUE CROSS/BLUE SHIELD | Admitting: Sports Medicine

## 2015-09-12 VITALS — BP 109/79 | Ht 68.0 in | Wt 204.0 lb

## 2015-09-12 DIAGNOSIS — M25571 Pain in right ankle and joints of right foot: Secondary | ICD-10-CM

## 2015-09-12 MED ORDER — PREDNISONE 10 MG PO TABS: ORAL_TABLET | ORAL | Status: DC

## 2015-09-12 NOTE — Progress Notes (Signed)
   Subjective:    Patient ID: Meagan Flores, female    DOB: 1966-10-20, 49 y.o.   MRN: [id number]  HPI chief complaint: Right ankle pain  49 year old female comes in today complaining of 2 weeks of lateral right ankle pain and swelling. Symptoms began acutely without any known trauma. She had a similar episode in the summer and was evaluated in the emergency room. She was diagnosed with gout but was placed on a muscle relaxer. Eventually her symptoms improved but never completely resolved. They became acutely worse 2 weeks ago. She's having great difficulty walking. At the time of her initial emergency room visit she was placed into a Cam Walker which has helped only minimally. When her symptoms returned a couple of weeks ago she returned to the emergency room but despite that she continues to have pain. Her job requires her to stand and walk for long periods of time. Her employer sent her home because of her limp. She localizes all of her pain and swelling to the lateral ankle. He denies swelling elsewhere in the ankle. She denies numbness or tingling. No fevers or chills. Symptoms do improve some at rest but never completely resolve. No imaging has been done.  Interim medical history reviewed Medications reviewed Allergies reviewed    Review of Systems    as above Objective:   Physical Exam Well-developed, well-nourished. No acute distress. Awake alert and oriented 3. Vital signs reviewed.  Right ankle: There is diffuse soft tissue swelling along the lateral ankle. Tenderness diffusely along the lateral ankle as well. Skin is intact. Normal color. Normal temperature. No appreciable effusion. No tenderness to palpation along the medial ankle. Neurovascularly intact distally. Walking with a limp.       Assessment & Plan:  Right ankle pain and swelling likely secondary to peroneal tendinitis  6 day Sterapred Dosepak to take as directed. Crutches to help assist with ambulation. I will get  an x-ray of her right ankle and she will follow-up with me in 5 days. We will keep her out of work today and I may need to extend that out of work notice until follow-up with me next week. Patient will notify me if that is the case. If symptoms persist I would consider merits of further diagnostic imaging. Once symptoms improve I would like to do a gait analysis. Call with questions or concerns in the interim.

## 2015-09-17 ENCOUNTER — Ambulatory Visit: Payer: BLUE CROSS/BLUE SHIELD | Admitting: Sports Medicine

## 2015-11-13 ENCOUNTER — Ambulatory Visit: Payer: BLUE CROSS/BLUE SHIELD | Admitting: Internal Medicine

## 2015-11-27 ENCOUNTER — Ambulatory Visit: Payer: BLUE CROSS/BLUE SHIELD | Admitting: Internal Medicine

## 2015-12-30 ENCOUNTER — Other Ambulatory Visit: Payer: Self-pay

## 2016-01-05 ENCOUNTER — Ambulatory Visit: Payer: BLUE CROSS/BLUE SHIELD | Admitting: Sports Medicine

## 2016-01-14 ENCOUNTER — Ambulatory Visit: Payer: BLUE CROSS/BLUE SHIELD | Admitting: Sports Medicine

## 2016-01-22 ENCOUNTER — Emergency Department (HOSPITAL_COMMUNITY)
Admission: EM | Admit: 2016-01-22 | Discharge: 2016-01-22 | Disposition: A | Discharge status: Home / Self Care | Payer: BLUE CROSS/BLUE SHIELD | Attending: Emergency Medicine | Admitting: Emergency Medicine

## 2016-01-22 ENCOUNTER — Emergency Department (HOSPITAL_COMMUNITY): Payer: BLUE CROSS/BLUE SHIELD

## 2016-01-22 ENCOUNTER — Encounter (HOSPITAL_COMMUNITY): Payer: Self-pay | Admitting: *Deleted

## 2016-01-22 DIAGNOSIS — F17200 Nicotine dependence, unspecified, uncomplicated: Secondary | ICD-10-CM | POA: Insufficient documentation

## 2016-01-22 DIAGNOSIS — Z7952 Long term (current) use of systemic steroids: Secondary | ICD-10-CM | POA: Diagnosis not present

## 2016-01-22 DIAGNOSIS — R109 Unspecified abdominal pain: Secondary | ICD-10-CM | POA: Diagnosis not present

## 2016-01-22 DIAGNOSIS — R112 Nausea with vomiting, unspecified: Secondary | ICD-10-CM | POA: Diagnosis not present

## 2016-01-22 DIAGNOSIS — E079 Disorder of thyroid, unspecified: Secondary | ICD-10-CM | POA: Diagnosis not present

## 2016-01-22 DIAGNOSIS — J069 Acute upper respiratory infection, unspecified: Secondary | ICD-10-CM | POA: Diagnosis not present

## 2016-01-22 DIAGNOSIS — R05 Cough: Secondary | ICD-10-CM | POA: Diagnosis present

## 2016-01-22 DIAGNOSIS — Z79899 Other long term (current) drug therapy: Secondary | ICD-10-CM | POA: Insufficient documentation

## 2016-01-22 MED ORDER — BENZONATATE 100 MG PO CAPS: 100.0000 mg | ORAL_CAPSULE | Freq: Three times a day (TID) | ORAL | Status: DC

## 2016-01-22 MED ORDER — ACETAMINOPHEN 325 MG PO TABS
ORAL_TABLET | ORAL | Status: AC
  Filled 2016-01-22: qty 2

## 2016-01-22 MED ORDER — ACETAMINOPHEN 325 MG PO TABS
650.0000 mg | ORAL_TABLET | Freq: Once | ORAL | Status: AC
  Administered 2016-01-22: 650 mg via ORAL
  Filled 2016-01-22: qty 2

## 2016-01-22 MED ORDER — IBUPROFEN 600 MG PO TABS: 600.0000 mg | ORAL_TABLET | Freq: Four times a day (QID) | ORAL | Status: DC | PRN

## 2016-01-22 MED ORDER — ALBUTEROL SULFATE HFA 108 (90 BASE) MCG/ACT IN AERS
2.0000 | INHALATION_SPRAY | RESPIRATORY_TRACT | Status: DC
  Administered 2016-01-22: 2 via RESPIRATORY_TRACT
  Filled 2016-01-22: qty 6.7

## 2016-01-22 MED ORDER — KETOROLAC TROMETHAMINE 60 MG/2ML IM SOLN
60.0000 mg | Freq: Once | INTRAMUSCULAR | Status: AC
  Administered 2016-01-22: 60 mg via INTRAMUSCULAR
  Filled 2016-01-22: qty 2

## 2016-01-22 NOTE — ED Provider Notes (Signed)
CSN: [id number]     Arrival date & time 01/22/16  1641 History  By signing my name below, I, Eustaquio Maize, attest that this documentation has been prepared under the direction and in the presence of Baelyn Doring, PA-C.  Electronically Signed: Eustaquio Maize, ED Scribe. 01/22/2016. 5:36 PM.   Chief Complaint  Patient presents with  . URI   The history is provided by the patient. No language interpreter was used.     HPI Comments: Meagan Flores is a 50 y.o. female who presents to the Emergency Department complaining of gradual onset, constant, cough x 2-3 days. Pt also complains of a headache, generalized body aches, bilateral eye pain, rhinorrhea, subjective fever, chills, nausea, vomiting, abdominal pain, and generalized weakness.  Her temperature in the ED is 98.3. She has been taking Tylenol and Mucinex for her symptoms without relief. The last time she took any medication was at 10 PM last night. Recent sick contact with similar symptoms. Denies diarrhea or any other associated symptoms. Pt is a current every day smoker.   Past Medical History  Diagnosis Date  . Thyroid disease    Past Surgical History  Procedure Laterality Date  . Thyroidectomy    . Partial hysterectomy    . Right arm sx     No family history on file. Social History  Substance Use Topics  . Smoking status: Current Every Day Smoker -- 0.50 packs/day  . Smokeless tobacco: None  . Alcohol Use: Yes     Comment: occassional   OB History    Gravida Para Term Preterm AB TAB SAB Ectopic Multiple Living   3 3 3  0 0 0 0 0 0 3     Review of Systems  Constitutional: Positive for fever (subjective) and chills.  HENT: Positive for rhinorrhea.   Eyes: Positive for pain (bilateral).  Respiratory: Positive for cough.   Gastrointestinal: Positive for nausea, vomiting and abdominal pain. Negative for diarrhea.  Musculoskeletal: Positive for myalgias.  Neurological: Positive for weakness (generalized) and headaches.    Allergies  Sulfa antibiotics  Home Medications   Prior to Admission medications   Medication Sig Start Date End Date Taking? Authorizing Provider  cyclobenzaprine (FLEXERIL) 10 MG tablet Take 1 tablet (10 mg total) by mouth 2 (two) times daily as needed for muscle spasms. Patient not taking: Reported on 09/07/2015 06/02/15   Montine Circle, PA-C  HYDROcodone-acetaminophen (NORCO/VICODIN) 5-325 MG per tablet Take 2 tablets by mouth every 4 (four) hours as needed. 06/02/15   Montine Circle, PA-C  ibuprofen (ADVIL,MOTRIN) 600 MG tablet Take 1 tablet (600 mg total) by mouth 3 (three) times daily. 09/04/15   Merrily Pew, MD  levothyroxine (SYNTHROID, LEVOTHROID) 125 MCG tablet Take 1 tablet (125 mcg total) by mouth daily before breakfast. 01/28/14   Reyne Dumas, MD  nitroGLYCERIN (NITRODUR - DOSED IN MG/24 HR) 0.2 mg/hr patch Cut and apply 1/4 patch to most painful area q24h. Patient not taking: Reported on 02/20/2015 01/24/15   Hilton Sinclair, MD  ondansetron [facility] ODT) 4 MG disintegrating tablet 4mg  ODT q4 hours prn nausea/vomit Patient not taking: Reported on 09/07/2015 02/21/15   Etta Quill, NP  predniSONE (DELTASONE) 10 MG tablet Take as directed 09/12/15   Thurman Coyer, DO  traMADol (ULTRAM) 50 MG tablet Take 1 tablet (50 mg total) by mouth every 6 (six) hours as needed. 09/04/15   Merrily Pew, MD   BP 115/77 mmHg  Pulse 88  Temp(Src) 98.3 F (36.8 C) (Oral)  Resp 16  SpO2 98%   Physical Exam  Constitutional: She is oriented to person, place, and time. She appears well-developed and well-nourished. No distress.  HENT:  Head: Normocephalic and atraumatic.  Right Ear: Tympanic membrane, external ear and ear canal normal.  Left Ear: Tympanic membrane, external ear and ear canal normal.  Nose: Mucosal edema and rhinorrhea present.  Mouth/Throat: Uvula is midline, oropharynx is clear and moist and mucous membranes are normal. No oropharyngeal exudate, posterior oropharyngeal  edema or posterior oropharyngeal erythema.  Eyes: Conjunctivae and EOM are normal.  Neck: Neck supple. No tracheal deviation present.  No meningismus  Cardiovascular: Normal rate, regular rhythm and normal heart sounds.   Pulmonary/Chest: Effort normal and breath sounds normal. No respiratory distress. She has no wheezes. She has no rales.  Abdominal: Soft. Bowel sounds are normal. She exhibits no distension. There is no tenderness. There is no rebound.  Musculoskeletal: Normal range of motion.  Neurological: She is alert and oriented to person, place, and time.  Skin: Skin is warm and dry.  Psychiatric: She has a normal mood and affect. Her behavior is normal.  Nursing note and vitals reviewed.   ED Course  Procedures (including critical care time)  DIAGNOSTIC STUDIES: Oxygen Saturation is 98% on RA, normal by my interpretation.    COORDINATION OF CARE: 5:35 PM-Discussed treatment plan which includes CXR with pt at bedside and pt agreed to plan.   Labs Review Labs Reviewed - No data to display  Imaging Review Dg Chest 2 View  01/22/2016  CLINICAL DATA:  Cough and congestion for 3 days EXAM: CHEST  2 VIEW COMPARISON:  September 07, 2015 FINDINGS: The heart size and mediastinal contours are stable. There is no focal infiltrate, pulmonary edema, or pleural effusion. The visualized skeletal structures are unremarkable. IMPRESSION: No active cardiopulmonary disease. Electronically Signed   By: Abelardo Diesel M.D.   On: 01/22/2016 17:56   I have personally reviewed and evaluated these images as part of my medical decision-making.   EKG Interpretation None      MDM   Final diagnoses:  URI (upper respiratory infection)   Pt with URI symptoms. Afebrile. Non toxic appearing. Vital signs are normal. Complaining of body aches, sore throat, nasal congestion, cough. Will get chest x-ray. Toradol and Tylenol given. Will monitor.   Chest x-ray is negative. Patient reports since that time  she has some wheezing. No wheezing heard on exam today. I will give her inhaler to use as needed for wheezing and cough. Will discharge home with ibuprofen and Tessalon. This is most likely a viral upper respiratory infection. Plans are, however patient has not had any fever here. Will discharge her home with return precautions and close follow-up.  Filed Vitals:   01/22/16 1723 01/22/16 1841  BP: 115/77 122/85  Pulse: 88 84  Temp: 98.3 F (36.8 C) 98.5 F (36.9 C)  TempSrc: Oral Oral  Resp: 16 16  SpO2: 98% 97%   I personally performed the services described in this documentation, which was scribed in my presence. The recorded information has been reviewed and is accurate.   Jeannett Senior, PA-C 01/22/16 1850  Leonard Schwartz, MD 01/26/16 1350

## 2016-01-22 NOTE — Discharge Instructions (Signed)
Ibuprofen for pain. Tessalon for cough. Inhaler 2 puffs every 4 hrs for wheezing. Drink plenty of fluids. Rest. Follow up with primary care doctor if not improving.   Upper Respiratory Infection, Adult Most upper respiratory infections (URIs) are caused by a virus. A URI affects the nose, throat, and upper air passages. The most common type of URI is often called "the common cold." HOME CARE   Take medicines only as told by your doctor.  Gargle warm saltwater or take cough drops to comfort your throat as told by your doctor.  Use a warm mist humidifier or inhale steam from a shower to increase air moisture. This may make it easier to breathe.  Drink enough fluid to keep your pee (urine) clear or pale yellow.  Eat soups and other clear broths.  Have a healthy diet.  Rest as needed.  Go back to work when your fever is gone or your doctor says it is okay.  You may need to stay home longer to avoid giving your URI to others.  You can also wear a face mask and wash your hands often to prevent spread of the virus.  Use your inhaler more if you have asthma.  Do not use any tobacco products, including cigarettes, chewing tobacco, or electronic cigarettes. If you need help quitting, ask your doctor. GET HELP IF:  You are getting worse, not better.  Your symptoms are not helped by medicine.  You have chills.  You are getting more short of breath.  You have brown or red mucus.  You have yellow or brown discharge from your nose.  You have pain in your face, especially when you bend forward.  You have a fever.  You have puffy (swollen) neck glands.  You have pain while swallowing.  You have white areas in the back of your throat. GET HELP RIGHT AWAY IF:   You have very bad or constant:  Headache.  Ear pain.  Pain in your forehead, behind your eyes, and over your cheekbones (sinus pain).  Chest pain.  You have long-lasting (chronic) lung disease and any of the  following:  Wheezing.  Long-lasting cough.  Coughing up blood.  A change in your usual mucus.  You have a stiff neck.  You have changes in your:  Vision.  Hearing.  Thinking.  Mood. MAKE SURE YOU:   Understand these instructions.  Will watch your condition.  Will get help right away if you are not doing well or get worse.   This information is not intended to replace advice given to you by your health care provider. Make sure you discuss any questions you have with your health care provider.   Document Released: 05/03/2008 Document Revised: 04/01/2015 Document Reviewed: 02/20/2014 Elsevier Interactive Patient Education [employer].

## 2016-01-22 NOTE — ED Notes (Signed)
Inhaler demonstration and teach back performed

## 2016-01-22 NOTE — ED Notes (Signed)
Patient able to ambulate independently  

## 2016-01-22 NOTE — ED Notes (Signed)
Pt here cough, nasal congestion and feeling "bad" for several days.  VS stable.

## 2016-01-28 ENCOUNTER — Encounter: Payer: Self-pay | Admitting: Sports Medicine

## 2016-01-28 ENCOUNTER — Ambulatory Visit (INDEPENDENT_AMBULATORY_CARE_PROVIDER_SITE_OTHER): Payer: BLUE CROSS/BLUE SHIELD | Admitting: Sports Medicine

## 2016-01-28 VITALS — BP 136/84 | HR 75 | Ht 68.0 in | Wt 194.0 lb

## 2016-01-28 DIAGNOSIS — M25511 Pain in right shoulder: Secondary | ICD-10-CM | POA: Diagnosis not present

## 2016-01-28 MED ORDER — METHYLPREDNISOLONE ACETATE 40 MG/ML IJ SUSP
40.0000 mg | Freq: Once | INTRAMUSCULAR | Status: AC
  Administered 2016-01-28: 40 mg via INTRA_ARTICULAR

## 2016-01-29 NOTE — Progress Notes (Signed)
   Subjective:    Patient ID: Meagan Flores, female    DOB: May 25, 1966, 50 y.o.   MRN: [id number]  HPI chief complaint: Right shoulder pain  Patient comes in today with returning right shoulder pain. 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 She has received multiple cortisone injections in the past for this. Last injection was in March of last year. Pain began to return several weeks ago. It is diffuse throughout the shoulder and worse with activity. She rates it a 10 out of 10 pain. It is a little better today as she was seen in the emergency room a few days ago for an upper respiratory infection and given an IM injection of cortisone. Her pain is identical in nature to what she has experienced previously. No recent trauma.     Review of Systems    as above Objective:   Physical Exam  Well-developed, well-nourished. No acute distress  Right shoulder: Full range of motion with a positive painful arc. Diffuse tenderness to palpation. No soft tissue swelling. No ecchymosis. No erythema. Positive empty can, positive Hawkins. Rotator cuff strength is 5/5 but reproducible of pain with resisted supraspinatus. Neurovascularly intact distally.      Assessment & Plan:  Returning right shoulder pain secondary to rotator cuff tendinopathy/subacromial bursitis  Patient has received several cortisone injections in the past but it is been almost a year since her last injection. Therefore, I will repeat a subacromial cortisone injection today. I explained to the patient that if her symptoms persist or return rather quickly then we need to consider further diagnostic imaging before further injection. She understands. She has had physical therapy in the past and has an understanding of the exercises that she needs to be doing. She will follow-up with me as needed.  Consent obtained and verified. Time-out conducted. Noted no overlying erythema, induration, or  other signs of local infection. Skin prepped in a sterile fashion. Topical analgesic spray: Ethyl chloride. Joint: right shoulder (subacromial) Needle: 25g 1.5 inch Completed without difficulty. Meds: 3cc 1% xylocaine, 1cc (40mg ) depomedrol  Advised to call if fevers/chills, erythema, induration, drainage, or persistent bleeding.

## 2016-03-01 ENCOUNTER — Ambulatory Visit: Payer: BLUE CROSS/BLUE SHIELD | Admitting: Sports Medicine

## 2016-06-06 ENCOUNTER — Encounter (HOSPITAL_COMMUNITY): Payer: Self-pay | Admitting: Emergency Medicine

## 2016-06-06 ENCOUNTER — Emergency Department (HOSPITAL_COMMUNITY)
Admission: EM | Admit: 2016-06-06 | Discharge: 2016-06-06 | Disposition: A | Discharge status: Home / Self Care | Payer: BLUE CROSS/BLUE SHIELD | Attending: Emergency Medicine | Admitting: Emergency Medicine

## 2016-06-06 ENCOUNTER — Emergency Department (HOSPITAL_COMMUNITY): Payer: BLUE CROSS/BLUE SHIELD

## 2016-06-06 DIAGNOSIS — R1084 Generalized abdominal pain: Secondary | ICD-10-CM | POA: Insufficient documentation

## 2016-06-06 DIAGNOSIS — F17200 Nicotine dependence, unspecified, uncomplicated: Secondary | ICD-10-CM | POA: Insufficient documentation

## 2016-06-06 DIAGNOSIS — R112 Nausea with vomiting, unspecified: Secondary | ICD-10-CM

## 2016-06-06 DIAGNOSIS — R197 Diarrhea, unspecified: Secondary | ICD-10-CM | POA: Diagnosis not present

## 2016-06-06 DIAGNOSIS — Z7982 Long term (current) use of aspirin: Secondary | ICD-10-CM | POA: Insufficient documentation

## 2016-06-06 LAB — COMPREHENSIVE METABOLIC PANEL
ALK PHOS: 70 U/L (ref 38–126)
ALT: 11 U/L — AB (ref 14–54)
AST: 17 U/L (ref 15–41)
Albumin: 3.5 g/dL (ref 3.5–5.0)
Anion gap: 6 (ref 5–15)
BUN: 7 mg/dL (ref 6–20)
CHLORIDE: 108 mmol/L (ref 101–111)
CO2: 26 mmol/L (ref 22–32)
CREATININE: 0.68 mg/dL (ref 0.44–1.00)
Calcium: 8.6 mg/dL — ABNORMAL LOW (ref 8.9–10.3)
GFR calc Af Amer: >60 mL/min (ref >60)
Glucose, Bld: 86 mg/dL (ref 65–99)
Potassium: 3.5 mmol/L (ref 3.5–5.1)
Sodium: 140 mmol/L (ref 135–145)
Total Bilirubin: 0.5 mg/dL (ref 0.3–1.2)
Total Protein: 6.7 g/dL (ref 6.5–8.1)

## 2016-06-06 LAB — CBC WITH DIFFERENTIAL/PLATELET
BASOS ABS: 0 10*3/uL (ref 0.0–0.1)
Basophils Relative: 0 %
EOS PCT: 1 %
Eosinophils Absolute: 0.1 10*3/uL (ref 0.0–0.7)
HCT: 40.4 % (ref 36.0–46.0)
HEMOGLOBIN: 12.6 g/dL (ref 12.0–15.0)
LYMPHS PCT: 36 %
Lymphs Abs: 2.1 10*3/uL (ref 0.7–4.0)
MCH: 23.6 pg — ABNORMAL LOW (ref 26.0–34.0)
MCHC: 31.2 g/dL (ref 30.0–36.0)
MCV: 75.8 fL — AB (ref 78.0–100.0)
Monocytes Absolute: 0.5 10*3/uL (ref 0.1–1.0)
Monocytes Relative: 8 %
NEUTROS ABS: 3.2 10*3/uL (ref 1.7–7.7)
NEUTROS PCT: 55 %
PLATELETS: 218 10*3/uL (ref 150–400)
RBC: 5.33 MIL/uL — AB (ref 3.87–5.11)
RDW: 14.7 % (ref 11.5–15.5)
WBC: 5.9 10*3/uL (ref 4.0–10.5)

## 2016-06-06 LAB — URINALYSIS, ROUTINE W REFLEX MICROSCOPIC
Bilirubin Urine: NEGATIVE
GLUCOSE, UA: NEGATIVE mg/dL
Hgb urine dipstick: NEGATIVE
Ketones, ur: NEGATIVE mg/dL
LEUKOCYTES UA: NEGATIVE
Nitrite: NEGATIVE
PH: 6.5 (ref 5.0–8.0)
Protein, ur: NEGATIVE mg/dL
Specific Gravity, Urine: 1.006 (ref 1.005–1.030)

## 2016-06-06 LAB — LIPASE, BLOOD: LIPASE: 23 U/L (ref 11–51)

## 2016-06-06 LAB — POC OCCULT BLOOD, ED: Fecal Occult Bld: NEGATIVE

## 2016-06-06 MED ORDER — SODIUM CHLORIDE 0.9 % IV BOLUS (SEPSIS)
500.0000 mL | Freq: Once | INTRAVENOUS | Status: AC
  Administered 2016-06-06: 500 mL via INTRAVENOUS

## 2016-06-06 MED ORDER — ONDANSETRON HCL 4 MG/2ML IJ SOLN
4.0000 mg | Freq: Once | INTRAMUSCULAR | Status: AC
  Administered 2016-06-06: 4 mg via INTRAVENOUS
  Filled 2016-06-06: qty 2

## 2016-06-06 MED ORDER — IOPAMIDOL (ISOVUE-300) INJECTION 61%
INTRAVENOUS | Status: AC
  Administered 2016-06-06: 100 mL
  Filled 2016-06-06: qty 100

## 2016-06-06 MED ORDER — ONDANSETRON 8 MG PO TBDP
8.0000 mg | ORAL_TABLET | Freq: Three times a day (TID) | ORAL | Status: DC | PRN
Start: 2016-06-06 — End: 2016-10-26

## 2016-06-06 MED ORDER — OXYCODONE-ACETAMINOPHEN 5-325 MG PO TABS
2.0000 | ORAL_TABLET | Freq: Once | ORAL | Status: AC
  Administered 2016-06-06: 2 via ORAL
  Filled 2016-06-06: qty 2

## 2016-06-06 MED ORDER — DIATRIZOATE MEGLUMINE & SODIUM 66-10 % PO SOLN
ORAL | Status: AC
  Filled 2016-06-06: qty 30

## 2016-06-06 NOTE — Discharge Instructions (Signed)
Nausea and Vomiting  Nausea means you feel sick to your stomach. Throwing up (vomiting) is a reflex where stomach contents come out of your mouth.  HOME CARE   · Take medicine as told by your doctor.  · Do not force yourself to eat. However, you do need to drink fluids.  · If you feel like eating, eat a normal diet as told by your doctor.    Eat rice, wheat, potatoes, bread, lean meats, yogurt, fruits, and vegetables.    Avoid high-fat foods.  · Drink enough fluids to keep your pee (urine) clear or pale yellow.  · Ask your doctor how to replace body fluid losses (rehydrate). Signs of body fluid loss (dehydration) include:    Feeling very thirsty.    Dry lips and mouth.    Feeling dizzy.    Dark pee.    Peeing less than normal.    Feeling confused.    Fast breathing or heart rate.  GET HELP RIGHT AWAY IF:   · You have blood in your throw up.  · You have black or bloody poop (stool).  · You have a bad headache or stiff neck.  · You feel confused.  · You have bad belly (abdominal) pain.  · You have chest pain or trouble breathing.  · You do not pee at least once every 8 hours.  · You have cold, clammy skin.  · You keep throwing up after 24 to 48 hours.  · You have a fever.  MAKE SURE YOU:   · Understand these instructions.  · Will watch your condition.  · Will get help right away if you are not doing well or get worse.     This information is not intended to replace advice given to you by your health care provider. Make sure you discuss any questions you have with your health care provider.     Document Released: 05/03/2008 Document Revised: 02/07/2012 Document Reviewed: 04/16/2011  Elsevier Interactive Patient Education ©2016 Else[employer]

## 2016-06-06 NOTE — ED Notes (Signed)
Patient transported to CT 

## 2016-06-06 NOTE — ED Notes (Signed)
Pt is upset about wait time.

## 2016-06-06 NOTE — ED Notes (Signed)
Called CT to inquire when pt will be taken for scan.

## 2016-06-06 NOTE — ED Provider Notes (Signed)
CSN: [id number]     Arrival date & time 06/06/16  [id number] History   First MD Initiated Contact with Patient 06/06/16 [id number]     Chief Complaint  Patient presents with  . Abdominal Pain     (Consider location/radiation/quality/duration/timing/severity/associated sxs/prior Treatment) The history is provided by the patient.    Patient with nausea vomiting and diarrhea for several days.  Complaining of diffuse  crampy abdominal pain.  NO fever, chills, uti.   Past Medical History  Diagnosis Date  . Thyroid disease    Past Surgical History  Procedure Laterality Date  . Thyroidectomy    . Partial hysterectomy    . Right arm sx     No family history on file. Social History  Substance Use Topics  . Smoking status: Current Every Day Smoker -- 0.50 packs/day for 34 years  . Smokeless tobacco: None  . Alcohol Use: Yes     Comment: occassional   OB History    Gravida Para Term Preterm AB TAB SAB Ectopic Multiple Living   3 3 3  0 0 0 0 0 0 3     Review of Systems  All other systems reviewed and are negative.     Allergies  Sulfa antibiotics  Home Medications   Prior to Admission medications   Medication Sig Start Date End Date Taking? Authorizing Provider  Aspirin-Salicylamide-Caffeine (BC HEADACHE POWDER PO) Take 1 packet by mouth daily as needed (headache).   Yes Historical Provider, MD  levothyroxine (SYNTHROID, LEVOTHROID) 125 MCG tablet Take 1 tablet (125 mcg total) by mouth daily before breakfast. 01/28/14  Yes Reyne Dumas, MD  nitroGLYCERIN (NITRODUR - DOSED IN MG/24 HR) 0.2 mg/hr patch Cut and apply 1/4 patch to most painful area q24h. 01/24/15  Yes Hilton Sinclair, MD  benzonatate (TESSALON) 100 MG capsule Take 1 capsule (100 mg total) by mouth every 8 (eight) hours. Patient not taking: Reported on 06/06/2016 01/22/16   Tatyana Kirichenko, PA-C  cyclobenzaprine (FLEXERIL) 10 MG tablet Take 1 tablet (10 mg total) by mouth 2 (two) times daily as needed for muscle  spasms. Patient not taking: Reported on 09/07/2015 06/02/15   Montine Circle, PA-C  HYDROcodone-acetaminophen (NORCO/VICODIN) 5-325 MG per tablet Take 2 tablets by mouth every 4 (four) hours as needed. Patient not taking: Reported on 06/06/2016 06/02/15   Montine Circle, PA-C  ibuprofen (ADVIL,MOTRIN) 600 MG tablet Take 1 tablet (600 mg total) by mouth every 6 (six) hours as needed. Patient not taking: Reported on 06/06/2016 01/22/16   Jeannett Senior, PA-C  ondansetron (ZOFRAN ODT) 4 MG disintegrating tablet 4mg  ODT q4 hours prn nausea/vomit Patient not taking: Reported on 09/07/2015 02/21/15   Etta Quill, NP  predniSONE (DELTASONE) 10 MG tablet Take as directed Patient not taking: Reported on 06/06/2016 09/12/15   Thurman Coyer, DO  traMADol (ULTRAM) 50 MG tablet Take 1 tablet (50 mg total) by mouth every 6 (six) hours as needed. Patient not taking: Reported on 06/06/2016 09/04/15   Merrily Pew, MD   BP 137/86 mmHg  Pulse 53  Temp(Src) 98 F (36.7 C) (Oral)  Resp 12  SpO2 100% Physical Exam  Constitutional: She is oriented to person, place, and time. She appears well-developed and well-nourished.  HENT:  Head: Normocephalic and atraumatic.  Right Ear: External ear normal.  Left Ear: External ear normal.  Nose: Nose normal.  Mouth/Throat: Oropharynx is clear and moist.  Eyes: Conjunctivae and EOM are normal. Pupils are equal, round, and reactive to light.  Neck: Normal  range of motion. Neck supple.  Cardiovascular: Normal rate, regular rhythm, normal heart sounds and intact distal pulses.   Pulmonary/Chest: Effort normal and breath sounds normal.  Abdominal: Soft. Bowel sounds are normal. There is tenderness.  Mild diffuse ttp  Musculoskeletal: Normal range of motion.  Neurological: She is alert and oriented to person, place, and time. She has normal reflexes.  Skin: Skin is warm and dry.  Psychiatric: She has a normal mood and affect. Her behavior is normal. Judgment and thought  content normal.  Nursing note and vitals reviewed.   ED Course  Procedures (including critical care time) Labs Review Labs Reviewed  CBC WITH DIFFERENTIAL/PLATELET - Abnormal; Notable for the following:    RBC 5.33 (*)    MCV 75.8 (*)    MCH 23.6 (*)    All other components within normal limits  COMPREHENSIVE METABOLIC PANEL - Abnormal; Notable for the following:    Calcium 8.6 (*)    ALT 11 (*)    All other components within normal limits  LIPASE, BLOOD  URINALYSIS, ROUTINE W REFLEX MICROSCOPIC (NOT AT [facility])  POC OCCULT BLOOD, ED    Imaging Review Ct Abdomen Pelvis W Contrast  06/06/2016  CLINICAL DATA:  50 year old female with history of abdominal pain for the past 4 days with nausea. Black stools. EXAM: CT ABDOMEN AND PELVIS WITH CONTRAST TECHNIQUE: Multidetector CT imaging of the abdomen and pelvis was performed using the standard protocol following bolus administration of intravenous contrast. CONTRAST:  100 mL of ISOVUE-300 COMPARISON:  CT the abdomen and pelvis 03/11/2008. FINDINGS: Lower chest:  Unremarkable. Hepatobiliary: No cystic or solid hepatic lesions. No intra or extrahepatic biliary ductal dilatation. Gallbladder is normal in appearance. Pancreas: No pancreatic mass. No pancreatic ductal dilatation. No pancreatic or peripancreatic fluid or inflammatory changes. Spleen: Unremarkable. Adrenals/Urinary Tract: Bilateral adrenal glands and the right kidney are normal in appearance. In the lower pole the left kidney there is a well-defined 1.6 cm low-attenuation nonenhancing lesion, compatible with a simple cyst. No hydroureteronephrosis. Urinary bladder is normal in appearance. Stomach/Bowel: Normal appearance of the stomach. No pathologic dilatation of small bowel or colon. Normal appendix. Vascular/Lymphatic: Aortic atherosclerosis, in addition to atherosclerotic plaque throughout other abdominal and pelvic vasculature, without evidence of aneurysm or dissection. No  lymphadenopathy noted in the abdomen or pelvis. Reproductive: Status post hysterectomy. Ovaries are not confidently identified may be surgically absent or atrophic. Other: Trace volume of free fluid in the cul-de-sac. No larger volume of ascites. No pneumoperitoneum. Musculoskeletal: There are no aggressive appearing lytic or blastic lesions noted in the visualized portions of the skeleton. IMPRESSION: 1. Trace volume of ascites in the cul-de-sac. This is of uncertain etiology and significance. 2. No other acute findings are noted in the abdomen or pelvis. 3. Normal appendix. 4. Aortic atherosclerosis. 5. Additional incidental findings, as above. Electronically Signed   By: Vinnie Langton M.D.   On: 06/06/2016 13:07   I have personally reviewed and evaluated these images and lab results as part of my medical decision-making.   EKG Interpretation None      MDM   Final diagnoses:  Nausea vomiting and diarrhea       Pattricia Boss, MD 06/06/16 1737

## 2016-06-06 NOTE — ED Notes (Signed)
Patient complains of abdominal pain and feeling unwell since Thursday.  Patient states that yesterday her stool was dark and tar-like.

## 2016-06-07 ENCOUNTER — Emergency Department (HOSPITAL_COMMUNITY)
Admission: EM | Admit: 2016-06-07 | Discharge: 2016-06-08 | Disposition: A | Discharge status: Home / Self Care | Payer: No Typology Code available for payment source | Attending: Emergency Medicine | Admitting: Emergency Medicine

## 2016-06-07 ENCOUNTER — Encounter (HOSPITAL_COMMUNITY): Payer: Self-pay | Admitting: *Deleted

## 2016-06-07 DIAGNOSIS — Y92410 Unspecified street and highway as the place of occurrence of the external cause: Secondary | ICD-10-CM | POA: Insufficient documentation

## 2016-06-07 DIAGNOSIS — S39012A Strain of muscle, fascia and tendon of lower back, initial encounter: Secondary | ICD-10-CM

## 2016-06-07 DIAGNOSIS — Y999 Unspecified external cause status: Secondary | ICD-10-CM | POA: Insufficient documentation

## 2016-06-07 DIAGNOSIS — Z7982 Long term (current) use of aspirin: Secondary | ICD-10-CM | POA: Diagnosis not present

## 2016-06-07 DIAGNOSIS — Z79899 Other long term (current) drug therapy: Secondary | ICD-10-CM | POA: Insufficient documentation

## 2016-06-07 DIAGNOSIS — M545 Low back pain: Secondary | ICD-10-CM | POA: Diagnosis present

## 2016-06-07 DIAGNOSIS — F17200 Nicotine dependence, unspecified, uncomplicated: Secondary | ICD-10-CM | POA: Insufficient documentation

## 2016-06-07 DIAGNOSIS — S46812A Strain of other muscles, fascia and tendons at shoulder and upper arm level, left arm, initial encounter: Secondary | ICD-10-CM | POA: Diagnosis not present

## 2016-06-07 DIAGNOSIS — Y9389 Activity, other specified: Secondary | ICD-10-CM | POA: Diagnosis not present
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
## 2016-06-08 MED ORDER — TRAMADOL HCL 50 MG PO TABS: 50.0000 mg | ORAL_TABLET | Freq: Four times a day (QID) | ORAL | Status: DC | PRN

## 2016-06-08 MED ORDER — CYCLOBENZAPRINE HCL 5 MG PO TABS: 5.0000 mg | ORAL_TABLET | Freq: Two times a day (BID) | ORAL | Status: DC | PRN

## 2016-06-08 MED ORDER — IBUPROFEN 600 MG PO TABS: 600.0000 mg | ORAL_TABLET | Freq: Four times a day (QID) | ORAL | Status: DC | PRN
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
## 2016-07-10 ENCOUNTER — Emergency Department (HOSPITAL_COMMUNITY)
Admission: EM | Admit: 2016-07-10 | Discharge: 2016-07-10 | Disposition: A | Discharge status: Home / Self Care | Payer: BLUE CROSS/BLUE SHIELD | Attending: Emergency Medicine | Admitting: Emergency Medicine

## 2016-07-10 ENCOUNTER — Encounter (HOSPITAL_COMMUNITY): Payer: Self-pay

## 2016-07-10 DIAGNOSIS — Z7982 Long term (current) use of aspirin: Secondary | ICD-10-CM | POA: Diagnosis not present

## 2016-07-10 DIAGNOSIS — N730 Acute parametritis and pelvic cellulitis: Secondary | ICD-10-CM

## 2016-07-10 DIAGNOSIS — R1030 Lower abdominal pain, unspecified: Secondary | ICD-10-CM | POA: Diagnosis present

## 2016-07-10 DIAGNOSIS — F17200 Nicotine dependence, unspecified, uncomplicated: Secondary | ICD-10-CM | POA: Insufficient documentation

## 2016-07-10 DIAGNOSIS — Z79899 Other long term (current) drug therapy: Secondary | ICD-10-CM | POA: Diagnosis not present

## 2016-07-10 DIAGNOSIS — N739 Female pelvic inflammatory disease, unspecified: Secondary | ICD-10-CM | POA: Insufficient documentation

## 2016-07-10 LAB — COMPREHENSIVE METABOLIC PANEL
ALBUMIN: 4 g/dL (ref 3.5–5.0)
ALK PHOS: 73 U/L (ref 38–126)
ALT: 10 U/L — ABNORMAL LOW (ref 14–54)
ANION GAP: 8 (ref 5–15)
AST: 15 U/L (ref 15–41)
BUN: 12 mg/dL (ref 6–20)
CHLORIDE: 104 mmol/L (ref 101–111)
CO2: 27 mmol/L (ref 22–32)
Calcium: 8.5 mg/dL — ABNORMAL LOW (ref 8.9–10.3)
Creatinine, Ser: 0.88 mg/dL (ref 0.44–1.00)
GFR calc Af Amer: >60 mL/min (ref >60)
GFR calc non Af Amer: >60 mL/min (ref >60)
GLUCOSE: 103 mg/dL — AB (ref 65–99)
POTASSIUM: 3.8 mmol/L (ref 3.5–5.1)
SODIUM: 139 mmol/L (ref 135–145)
Total Bilirubin: 0.3 mg/dL (ref 0.3–1.2)
Total Protein: 7 g/dL (ref 6.5–8.1)

## 2016-07-10 LAB — URINALYSIS, ROUTINE W REFLEX MICROSCOPIC
BILIRUBIN URINE: NEGATIVE
Glucose, UA: NEGATIVE mg/dL
Ketones, ur: NEGATIVE mg/dL
Leukocytes, UA: NEGATIVE
Nitrite: NEGATIVE
PH: 5.5 (ref 5.0–8.0)
Protein, ur: NEGATIVE mg/dL
SPECIFIC GRAVITY, URINE: 1.022 (ref 1.005–1.030)

## 2016-07-10 LAB — CBC
HCT: 37.6 % (ref 36.0–46.0)
HEMOGLOBIN: 12 g/dL (ref 12.0–15.0)
MCH: 23.8 pg — AB (ref 26.0–34.0)
MCHC: 31.9 g/dL (ref 30.0–36.0)
MCV: 74.6 fL — ABNORMAL LOW (ref 78.0–100.0)
Platelets: 234 10*3/uL (ref 150–400)
RBC: 5.04 MIL/uL (ref 3.87–5.11)
RDW: 14.6 % (ref 11.5–15.5)
WBC: 8.2 10*3/uL (ref 4.0–10.5)

## 2016-07-10 LAB — LIPASE, BLOOD: LIPASE: 27 U/L (ref 11–51)

## 2016-07-10 LAB — URINE MICROSCOPIC-ADD ON

## 2016-07-10 LAB — WET PREP, GENITAL
Clue Cells Wet Prep HPF POC: NONE SEEN
SPERM: NONE SEEN
Yeast Wet Prep HPF POC: NONE SEEN

## 2016-07-10 MED ORDER — NAPROXEN 500 MG PO TABS: 500.0000 mg | ORAL_TABLET | Freq: Two times a day (BID) | ORAL | 0 refills | Status: DC | PRN

## 2016-07-10 MED ORDER — CEFTRIAXONE SODIUM 250 MG IJ SOLR
250.0000 mg | Freq: Once | INTRAMUSCULAR | Status: AC
  Administered 2016-07-10: 250 mg via INTRAMUSCULAR
  Filled 2016-07-10: qty 250

## 2016-07-10 MED ORDER — METRONIDAZOLE 500 MG PO TABS
2000.0000 mg | ORAL_TABLET | Freq: Once | ORAL | Status: AC
  Administered 2016-07-10: 2000 mg via ORAL
  Filled 2016-07-10: qty 4

## 2016-07-10 MED ORDER — LIDOCAINE HCL (PF) 1 % IJ SOLN
INTRAMUSCULAR | Status: AC
  Administered 2016-07-10: 1 mL
  Filled 2016-07-10: qty 5

## 2016-07-10 MED ORDER — DOXYCYCLINE HYCLATE 100 MG PO CAPS: 100.0000 mg | ORAL_CAPSULE | Freq: Two times a day (BID) | ORAL | 0 refills | Status: DC

## 2016-07-10 NOTE — ED Provider Notes (Signed)
[facility] DEPT Provider Note   CSN: [id number] Arrival date & time: 07/10/16  1919  First Provider Contact:  None       History   Chief Complaint Chief Complaint  Patient presents with  . Abdominal Pain    HPI Meagan Flores is a 50 y.o. female.  50 year old female with a history of thyroid disease presents to the emergency department for evaluation of abdominal pain. She reports 1 week of a constant waxing and waning aching pain in her suprapubic abdomen. She has also experienced intermittent sharp pains to this area. Symptoms associated with spontaneous vaginal bleeding which has been improving. Patient states that she has also noticed some blood "on the toilet tissue" when wiping following a void. Patient has been taking BC powders for pain without relief. She denies any associated fever, dysuria, urinary frequency or urgency, vomiting, diarrhea, melena, hematochezia, or constipation. Abdominal surgical history significant for partial hysterectomy.    Abdominal Pain   Associated symptoms include nausea. Pertinent negatives include fever, diarrhea, vomiting, constipation and frequency.    Past Medical History:  Diagnosis Date  . Thyroid disease     Patient Active Problem List   Diagnosis Date Noted  . Right shoulder pain 01/24/2015  . Glenohumeral arthritis 12/27/2014    Past Surgical History:  Procedure Laterality Date  . PARTIAL HYSTERECTOMY    . right arm sx    . THYROIDECTOMY      OB History    Gravida Para Term Preterm AB Living   3 3 3  0 0 3   SAB TAB Ectopic Multiple Live Births   0 0 0 0         Home Medications    Prior to Admission medications   Medication Sig Start Date End Date Taking? Authorizing Provider  levothyroxine (SYNTHROID, LEVOTHROID) 125 MCG tablet Take 1 tablet (125 mcg total) by mouth daily before breakfast. 01/28/14  Yes Reyne Dumas, MD  Aspirin-Salicylamide-Caffeine (BC HEADACHE POWDER PO) Take 1 packet by mouth daily as  needed (headache).    Historical Provider, MD  benzonatate (TESSALON) 100 MG capsule Take 1 capsule (100 mg total) by mouth every 8 (eight) hours. Patient not taking: Reported on 06/06/2016 01/22/16   Tatyana Kirichenko, PA-C  cyclobenzaprine (FLEXERIL) 5 MG tablet Take 1 tablet (5 mg total) by mouth 2 (two) times daily as needed for muscle spasms. Patient not taking: Reported on 07/10/2016 06/08/16   Margarita Mail, PA-C  doxycycline (VIBRAMYCIN) 100 MG capsule Take 1 capsule (100 mg total) by mouth 2 (two) times daily. Take with full glass of milk or water. 07/10/16   Antonietta Breach, PA-C  HYDROcodone-acetaminophen (NORCO/VICODIN) 5-325 MG per tablet Take 2 tablets by mouth every 4 (four) hours as needed. Patient not taking: Reported on 06/06/2016 06/02/15   Montine Circle, PA-C  ibuprofen (ADVIL,MOTRIN) 600 MG tablet Take 1 tablet (600 mg total) by mouth every 6 (six) hours as needed. Patient not taking: Reported on 07/10/2016 06/08/16   Margarita Mail, PA-C  naproxen (NAPROSYN) 500 MG tablet Take 1 tablet (500 mg total) by mouth 2 (two) times daily as needed for mild pain or moderate pain. 07/10/16   Antonietta Breach, PA-C  nitroGLYCERIN (NITRODUR - DOSED IN MG/24 HR) 0.2 mg/hr patch Cut and apply 1/4 patch to most painful area q24h. Patient not taking: Reported on 07/10/2016 01/24/15   Hilton Sinclair, MD  ondansetron (ZOFRAN ODT) 8 MG disintegrating tablet Take 1 tablet (8 mg total) by mouth every 8 (eight) hours  as needed for nausea or vomiting. Patient not taking: Reported on 07/10/2016 06/06/16   Pattricia Boss, MD  predniSONE (DELTASONE) 10 MG tablet Take as directed Patient not taking: Reported on 06/06/2016 09/12/15   Thurman Coyer, DO  traMADol (ULTRAM) 50 MG tablet Take 1 tablet (50 mg total) by mouth every 6 (six) hours as needed. Patient not taking: Reported on 07/10/2016 06/08/16   Margarita Mail, PA-C    Family History No family history on file.  Social History Social History  Substance Use  Topics  . Smoking status: Current Every Day Smoker    Packs/day: 0.50    Years: 34.00  . Smokeless tobacco: Never Used  . Alcohol use Yes     Comment: occassional     Allergies   Sulfa antibiotics   Review of Systems Review of Systems  Constitutional: Negative for fever.  Gastrointestinal: Positive for abdominal pain and nausea. Negative for blood in stool, constipation, diarrhea and vomiting.  Genitourinary: Positive for vaginal bleeding. Negative for frequency.       +"blood on toilet tissue after wiping" following void  Ten systems reviewed and are negative for acute change, except as noted in the HPI.    Physical Exam Updated Vital Signs BP 125/92   Pulse 69   Temp 98.6 F (37 C) (Oral)   Resp 18   Ht 5\' 8"  (1.727 m)   Wt 83.9 kg   SpO2 99%   BMI 28.13 kg/m   Physical Exam  Constitutional: She is oriented to person, place, and time. She appears well-developed and well-nourished. No distress.  Nontoxic appearing and in NAD  HENT:  Head: Normocephalic and atraumatic.  Eyes: Conjunctivae and EOM are normal. No scleral icterus.  Neck: Normal range of motion.  Pulmonary/Chest: Effort normal. No respiratory distress. She has no wheezes. She has no rales.  Respirations even and unlabored. Lungs CTAB.  Abdominal: She exhibits no distension and no mass. There is tenderness. There is no guarding.  Mild suprapubic TTP without masses. No involuntary guarding or peritoneal signs.  Genitourinary: There is no rash, tenderness, lesion or injury on the right labia. There is no rash, tenderness, lesion or injury on the left labia. Cervix exhibits no motion tenderness. No bleeding in the vagina. No foreign body in the vagina. No signs of injury around the vagina. No vaginal discharge found.  Genitourinary Comments: No blood in vaginal vault. No evidence of trauma.  Musculoskeletal: Normal range of motion.  Neurological: She is alert and oriented to person, place, and time.  GCS  15. Patient moving all extremities.  Skin: Skin is warm and dry. No rash noted. She is not diaphoretic. No erythema. No pallor.  Psychiatric: She has a normal mood and affect. Her behavior is normal.  Nursing note and vitals reviewed.    ED Treatments / Results  Labs (all labs ordered are listed, but only abnormal results are displayed) Labs Reviewed  WET PREP, GENITAL - Abnormal; Notable for the following:       Result Value   Trich, Wet Prep PRESENT (*)    WBC, Wet Prep HPF POC MANY (*)    All other components within normal limits  COMPREHENSIVE METABOLIC PANEL - Abnormal; Notable for the following:    Glucose, Bld 103 (*)    Calcium 8.5 (*)    ALT 10 (*)    All other components within normal limits  CBC - Abnormal; Notable for the following:    MCV 74.6 (*)  MCH 23.8 (*)    All other components within normal limits  URINALYSIS, ROUTINE W REFLEX MICROSCOPIC (NOT AT [facility]) - Abnormal; Notable for the following:    Hgb urine dipstick TRACE (*)    All other components within normal limits  URINE MICROSCOPIC-ADD ON - Abnormal; Notable for the following:    Squamous Epithelial / LPF 0-5 (*)    Bacteria, UA RARE (*)    All other components within normal limits  LIPASE, BLOOD  GC/CHLAMYDIA PROBE AMP ([place]) NOT AT [facility]    EKG  EKG Interpretation None       Radiology No results found.  Procedures Procedures (including critical care time)  Medications Ordered in ED Medications  cefTRIAXone (ROCEPHIN) injection 250 mg (250 mg Intramuscular Given 07/10/16 2234)  metroNIDAZOLE (FLAGYL) tablet 2,000 mg (2,000 mg Oral Given 07/10/16 2236)  lidocaine (PF) (XYLOCAINE) 1 % injection (1 mL  Given 07/10/16 2235)     Initial Impression / Assessment and Plan / ED Course  I have reviewed the triage vital signs and the nursing notes.  Pertinent labs & imaging results that were available during my care of the patient were reviewed by me and considered in my medical decision  making (see chart for details).  Clinical Course    50 year old female with history of partial hysterectomy presents to the emergency department for evaluation of suprapubic abdominal pain times one week. She is afebrile and without leukocytosis. There is focal tenderness appreciated without signs of acute surgical abdomen. Patient complaining of bleeding as well. It is unclear whether this may be from her vagina or urethra. Patient has no evidence of blood in her vaginal vault today. She has no microscopic hematuria. No evidence of UTI.  Pelvic exam performed revealed trichomonas on wet prep. This was revealed to the patient with her husband at bedside following consent for me to discuss her test results in the presence of her husband. Given location of abdominal pain, there is concern for PID. Patient to be treated with Rocephin, Flagyl, and outpatient course of doxycycline 2 weeks. I do not believe further imaging is indicated. Patient has been instructed to follow-up with a primary care doctor and/or an OB/GYN for reevaluation of symptoms in one week. Return precautions discussed and provided. Patient discharged in satisfactory condition with no unaddressed concerns.   Final Clinical Impressions(s) / ED Diagnoses   Final diagnoses:  PID (acute pelvic inflammatory disease)    New Prescriptions New Prescriptions   DOXYCYCLINE (VIBRAMYCIN) 100 MG CAPSULE    Take 1 capsule (100 mg total) by mouth 2 (two) times daily. Take with full glass of milk or water.   NAPROXEN (NAPROSYN) 500 MG TABLET    Take 1 tablet (500 mg total) by mouth 2 (two) times daily as needed for mild pain or moderate pain.     Antonietta Breach, PA-C 07/10/16 [place], MD 07/10/16 2257

## 2016-07-10 NOTE — Discharge Instructions (Signed)
Take doxycyline as prescribed until finished. Do not engage in sexual intercourse until 1 week after completion of your antibiotics. Have your partner tested for STDs at the health department. Follow up with an OBGYN for recheck, to ensure resolution of symptoms.

## 2016-07-10 NOTE — ED Notes (Signed)
Pt verbalized understanding of d/c instructions and has no further questions. Pt is stable, A&Ox4, VSS.  

## 2016-07-12 LAB — GC/CHLAMYDIA PROBE AMP (~~LOC~~) NOT AT ARMC
CHLAMYDIA, DNA PROBE: NEGATIVE
NEISSERIA GONORRHEA: NEGATIVE

## 2016-09-28 ENCOUNTER — Ambulatory Visit (INDEPENDENT_AMBULATORY_CARE_PROVIDER_SITE_OTHER): Payer: BLUE CROSS/BLUE SHIELD | Admitting: Family Medicine

## 2016-09-28 VITALS — BP 103/74

## 2016-09-28 DIAGNOSIS — Z720 Tobacco use: Secondary | ICD-10-CM | POA: Insufficient documentation

## 2016-09-28 DIAGNOSIS — M25511 Pain in right shoulder: Secondary | ICD-10-CM

## 2016-09-28 HISTORY — DX: Tobacco use: Z72.0

## 2016-09-28 MED ORDER — NITROGLYCERIN 0.1 MG/HR TD PT24: MEDICATED_PATCH | TRANSDERMAL | 1 refills | Status: DC

## 2016-09-28 NOTE — Progress Notes (Signed)
  [name] - 50 y.o. female MRN [id number]  Date of birth: 10-01-1966  SUBJECTIVE:  Including CC & ROS.  Chief Complaint  Patient presents with  . Shoulder Pain    Ms. Bina is a 62 is a 50 year old female that is presenting with chronic right shoulder pain. This is been going on for over a year. She has had for subacromial injections as well as 1 acromioclavicular injection. These have had minimal and affect. She reports that the pain is anterior to posterior in nature. It is localized to her shoulder. She denies any inciting event when this first occurred in no prior injury or surgery. She has tried Aleve, BC powder, and Advil with no improvement.  ROS: No unexpected weight loss, fever, chills, swelling, instability, numbness/tingling, redness, otherwise see HPI    HISTORY: Past Medical, Surgical, Social, and Family History Reviewed & Updated per EMR.   Pertinent Historical Findings include: PMSHx -  Hypothyroidism.  PSHx -  Current tobacco user  FHx -  Cancer  Medications - synthroid   DATA REVIEWED: 12/11/14. Right shoulder x-ray: no fracture 12/27/14 right shoulder [place]: The above findings are consistent with subscapularis chronic tear, mild degenerative changes of the [facility] joint.   PHYSICAL EXAM:  VS: BP:103/74  HR: bpm  TEMP: ( )  RESP:   HT:    WT:   BMI:  PHYSICAL EXAM: Gen: NAD, alert, cooperative with exam,  HEENT: clear conjunctiva, EOMI CV:  no edema, capillary refill brisk,  Resp: non-labored, normal speech Skin: no rashes, normal turgor  Neuro: no gross deficits.  Psych:  alert and oriented Right Shoulder:  No significant tenderness to palpation over the biceps tendon of the acromioclavicular joint. Limited flexion and abduction actively to roughly 90. Able to achieve full range of motion passively. Appears to have weakness to internal and external rotation. Weakness with empty can but poor effort. Negative Hawkin's test. Negative drop arm  test. Neurovascular intact. Normal grip strength.   ASSESSMENT & PLAN:   Right shoulder pain Pain most likely associated with chronic tear vs impingement. She has received at least for subacromial injections so would try to avoid injecting this area any further. Can consider an acromioclavicular joint injection. - Initiate nitroglycerin protocol - Referral placed to physical therapy - She'll follow-up in 4 weeks.

## 2016-09-30 NOTE — Assessment & Plan Note (Addendum)
Pain most likely associated with chronic tear vs impingement. She has received at least for subacromial injections so would try to avoid injecting this area any further. Can consider an acromioclavicular joint injection. - Initiate nitroglycerin protocol - Referral placed to physical therapy - She'll follow-up in 4 weeks.

## 2016-10-07 ENCOUNTER — Encounter: Payer: Self-pay | Admitting: Physical Therapy

## 2016-10-07 ENCOUNTER — Ambulatory Visit: Payer: BLUE CROSS/BLUE SHIELD | Admitting: Physical Therapy

## 2016-10-07 ENCOUNTER — Ambulatory Visit: Payer: BLUE CROSS/BLUE SHIELD | Attending: Internal Medicine | Admitting: Physical Therapy

## 2016-10-07 DIAGNOSIS — M25511 Pain in right shoulder: Secondary | ICD-10-CM | POA: Insufficient documentation

## 2016-10-07 DIAGNOSIS — G8929 Other chronic pain: Secondary | ICD-10-CM

## 2016-10-07 NOTE — Therapy (Signed)
Meagan Flores, [place], [address] Phone: [phone]   Fax:  [phone]  Physical Therapy Evaluation  Patient Details  Name: Meagan Flores MRN: [id number] Date of Birth: March 28, 1966 Referring Provider: Enid Baas Schmitz,MD  Encounter Date: 10/07/2016      PT End of Session - 10/07/16 1506    Visit Number 1   Number of Visits 13   Date for PT Re-Evaluation 11/19/16   Authorization Type [employer]- 30 visit limit   PT Start Time 1502   PT Stop Time 1555   PT Time Calculation (min) 53 min   Activity Tolerance Patient limited by pain   Behavior During Therapy [facility] for tasks assessed/performed      Past Medical History:  Diagnosis Date  . Thyroid disease     Past Surgical History:  Procedure Laterality Date  . PARTIAL HYSTERECTOMY    . right arm sx    . THYROIDECTOMY      There were no vitals filed for this visit.       Subjective Assessment - 10/07/16 1506    Subjective Has been seeing MD for 1 year for shoulder pain. Works in [employer] requiring heavy lifting. Reports ER told her she has bone spurs in shoulder. Reports MRI showed RC tear, bone spurs and arthitis. Feels like a constant tooth ache that locks occasionally.   Patient Stated Goals reaching overhead, comb hair, reaching across body, household chores   Currently in Pain? Yes   Pain Score 7    Pain Location Shoulder   Pain Orientation Right   Pain Descriptors / Indicators Aching  toothache   Pain Type Chronic pain   Aggravating Factors  reaching, lifting, using arm   Pain Relieving Factors wrap arm at night, pain patches (cause HA)            [facility] PT Assessment - 10/07/16 0001      Assessment   Medical Diagnosis R shoulder pain   Referring Provider Ysidro Evert E Schmitz,MD   Hand Dominance Right   Next MD Visit 10/26/16   Prior Therapy no     Precautions   Precautions None     Restrictions   Weight Bearing Restrictions No      Balance Screen   Has the patient fallen in the past 6 months No     [place] residence   Living Arrangements Spouse/significant other;Children;Other relatives   Additional Comments no stairs     Prior Function   Level of Independence Independent     Cognition   Overall Cognitive Status Within Functional Limits for tasks assessed     Observation/Other Assessments   Focus on Therapeutic Outcomes (FOTO)  34% ability     Sensation   Additional Comments occasional N/T     ROM / Strength   AROM / PROM / Strength AROM;Strength     AROM   Overall AROM Comments pain with all, PROM WFL with pain   AROM Assessment Site Shoulder   Right/Left Shoulder Right   Right Shoulder Flexion 70 Degrees   Right Shoulder ABduction 62 Degrees   Right Shoulder Internal Rotation 32 Degrees     Strength   Strength Assessment Site Shoulder;Hand   Right/Left Shoulder Right   Right Shoulder Flexion 3-/5   Right Shoulder Extension 3-/5   Right Shoulder ABduction 3-/5   Right Shoulder Internal Rotation 3+/5   Right Shoulder External Rotation 3+/5   Right/Left hand Right  Right Hand Grip (lbs) 15  L55lb     Palpation   Palpation comment +empty can (unable to hold position-cocordant pain)                   [facility] Adult PT Treatment/Exercise - 10/07/16 0001      Exercises   Exercises Neck;Shoulder     Neck Exercises: Seated   Neck Retraction Limitations in seated, paired with scapular retraction     Shoulder Exercises: Seated   Retraction Limitations in seated, heavy cuing     Modalities   Modalities Electrical Stimulation;Cryotherapy     Cryotherapy   Number Minutes Cryotherapy 15 Minutes  concurrent with ESTIM   Cryotherapy Location Shoulder  R   Type of Cryotherapy Ice pack     Electrical Stimulation   Electrical Stimulation Location R GHJ   Electrical Stimulation Action IFC   Electrical Stimulation Parameters 15' concurrent with  cryo   Electrical Stimulation Goals Pain                PT Education - 10/07/16 1553    Education provided Yes   Education Details anatomy of condition, POC, HEP, exercise form/rationale, RCtear will not reverse- strengthen other muscles for support   Person(s) Educated Patient   Methods Explanation;Demonstration;Tactile cues;Verbal cues;Handout   Comprehension Verbalized understanding;Returned demonstration;Verbal cues required;Tactile cues required;Need further instruction          PT Short Term Goals - 10/07/16 1609      PT SHORT TERM GOAL #1   Title Pt will demo 10 deg increase in flexion and abd ROM to demo improved functional movement by 12/8   Baseline see flowsheet   Time 4   Period Weeks   Status New     PT SHORT TERM GOAL #2   Title 10lb increase in grip strength for improved function   Baseline see flowsheet   Time 4   Period Weeks   Status New           PT Long Term Goals - 10/07/16 1613      PT LONG TERM GOAL #1   Title Pt will be able to comb her hair using her R hana GHJ pain <=3/10 to improve self care ability by 12/22   Baseline unable at eval   Time 6   Period Weeks   Status New     PT LONG TERM GOAL #2   Title Pt will be able to return to performing some household chores utilizing R arm   Baseline unable to do household chores at eval   Time 6   Period Weeks   Status New     PT LONG TERM GOAL #3   Title Pt will be able to reach across in a horizontal ADD movement to improve self care activity ability pain <=3/10   Baseline severe pain/unable at eval   Time 6   Period Weeks   Status New     PT LONG TERM GOAL #4   Title FOTO to 62% ability to indicate significant improvement in functional ability   Baseline 34% at eval   Time 6   Period Weeks               Plan - 10/07/16 1554    Clinical Impression Statement Pt presents to PT with complaints of R shoulder pain that she reports was diagnosed with spurring, arthitic  changes and partial RC tear via MRI. Pt PROM is [facility] but is painful, severely  limited in AROM due to pain. Pt is currently working in [employer] requiring liftin, sewing etc. Pt will benefit from skilled PT in order to stabilize periscapular region to provide support to shoulder. Pt was warned that the tear will not "heal", it will always be partially torn and I cannot make spurs go away, will evaluate pain and functional ability to determine effectiveness of conservative therapy.    Rehab Potential Fair   PT Frequency 2x / week   PT Duration 6 weeks   PT Treatment/Interventions ADLs/Self Care Home Management;Cryotherapy;Electrical Stimulation;Iontophoresis 4mg /ml Dexamethasone;Functional mobility training;Ultrasound;Moist Heat;Therapeutic activities;Therapeutic exercise;Neuromuscular re-education;Patient/family education;Passive range of motion;Manual techniques;Dry needling;Taping;Vasopneumatic Device   PT Next Visit Plan PROM, periscapular stabs-isometrics   PT Home Exercise Plan scapular and cervical retraction   Consulted and Agree with Plan of Care Patient      Patient will benefit from skilled therapeutic intervention in order to improve the following deficits and impairments:  Impaired UE functional use, Increased muscle spasms, Decreased activity tolerance, Pain, Decreased strength, Improper body mechanics  Visit Diagnosis: Chronic right shoulder pain - Plan: PT plan of care cert/re-cert     Problem List Patient Active Problem List   Diagnosis Date Noted  . Right shoulder pain 01/24/2015  . Glenohumeral arthritis 12/27/2014   Hadiya Spoerl C. Audri Kozub PT, DPT 10/07/16 4:25 PM   [facility] [address] [place], [place], [address] Phone: [phone]   Fax:  [phone]  Name: Meagan Flores MRN: [id number] Date of Birth: 02/14/1966

## 2016-10-12 ENCOUNTER — Ambulatory Visit: Payer: BLUE CROSS/BLUE SHIELD | Admitting: Physical Therapy

## 2016-10-12 DIAGNOSIS — G8929 Other chronic pain: Secondary | ICD-10-CM

## 2016-10-12 DIAGNOSIS — M25511 Pain in right shoulder: Principal | ICD-10-CM

## 2016-10-12 NOTE — Therapy (Signed)
[place], [place], [address] Phone: [phone]   Fax:  [phone]  Physical Therapy Treatment  Patient Details  Name: Meagan Flores MRN: [id number] Date of Birth: 08-06-1966 Referring Provider: Enid Baas Schmitz,MD  Encounter Date: 10/12/2016      PT End of Session - 10/12/16 1533    Visit Number 2   Number of Visits 13   Date for PT Re-Evaluation 11/19/16   Authorization Type BCBS- 30 visit limit   PT Start Time 1500   PT Stop Time 1548   PT Time Calculation (min) 48 min   Activity Tolerance Patient limited by pain   Behavior During Therapy [facility] for tasks assessed/performed      Past Medical History:  Diagnosis Date  . Thyroid disease     Past Surgical History:  Procedure Laterality Date  . PARTIAL HYSTERECTOMY    . right arm sx    . THYROIDECTOMY      There were no vitals filed for this visit.      Subjective Assessment - 10/12/16 1501    Subjective still having pain; trying to do exercises. had a lot of pain last night and had to get up at [facility] for work.   Patient Stated Goals reaching overhead, comb hair, reaching across body, household chores   Currently in Pain? Yes   Pain Score 6    Pain Location Shoulder   Pain Orientation Right   Pain Descriptors / Indicators Aching   Pain Type Chronic pain   Aggravating Factors  reaching, lifting, using arm   Pain Relieving Factors wrap arm at night, pain patches                         [facility] Adult PT Treatment/Exercise - 10/12/16 1503      Neck Exercises: Seated   Neck Retraction 10 reps;3 secs     Shoulder Exercises: Supine   External Rotation Right;10 reps;AAROM   External Rotation Limitations cane   Flexion AAROM;10 reps   Flexion Limitations cane   Other Supine Exercises chest press x 10 with cane     Shoulder Exercises: Seated   Retraction Both;10 reps     Cryotherapy   Number Minutes Cryotherapy 15  Minutes   Cryotherapy Location Shoulder   Type of Cryotherapy Ice pack     Electrical Stimulation   Electrical Stimulation Location R GHJ   Electrical Stimulation Action IFC   Electrical Stimulation Parameters to tolerance x 15 min   Electrical Stimulation Goals Pain     Manual Therapy   Manual Therapy Passive ROM   Passive ROM in supine R shoulder flexion, abdct, er/ir to tolerance                  PT Short Term Goals - 10/07/16 1609      PT SHORT TERM GOAL #1   Title Pt will demo 10 deg increase in flexion and abd ROM to demo improved functional movement by 12/8   Baseline see flowsheet   Time 4   Period Weeks   Status New     PT SHORT TERM GOAL #2   Title 10lb increase in grip strength for improved function   Baseline see flowsheet   Time 4   Period Weeks   Status New           PT Long Term Goals - 10/07/16 1613      PT LONG  TERM GOAL #1   Title Pt will be able to comb her hair using her R hana GHJ pain <=3/10 to improve self care ability by 12/22   Baseline unable at eval   Time 6   Period Weeks   Status New     PT LONG TERM GOAL #2   Title Pt will be able to return to performing some household chores utilizing R arm   Baseline unable to do household chores at eval   Time 6   Period Weeks   Status New     PT LONG TERM GOAL #3   Title Pt will be able to reach across in a horizontal ADD movement to improve self care activity ability pain <=3/10   Baseline severe pain/unable at eval   Time 6   Period Weeks   Status New     PT LONG TERM GOAL #4   Title FOTO to 62% ability to indicate significant improvement in functional ability   Baseline 34% at eval   Time 6   Period Weeks               Plan - 10/12/16 1533    Clinical Impression Statement Pt with limited tolerance to PROM and initiation of AAROM exercises.  Pt also very tired and nearly falling asleep throughout session.  Will continue to benefit from PT to address deficits if  pt able to tolerate exercises.   PT Treatment/Interventions ADLs/Self Care Home Management;Cryotherapy;Electrical Stimulation;Iontophoresis 4mg /ml Dexamethasone;Functional mobility training;Ultrasound;Moist Heat;Therapeutic activities;Therapeutic exercise;Neuromuscular re-education;Patient/family education;Passive range of motion;Manual techniques;Dry needling;Taping;Vasopneumatic Device   PT Next Visit Plan PROM, periscapular stabs-isometrics   Consulted and Agree with Plan of Care Patient      Patient will benefit from skilled therapeutic intervention in order to improve the following deficits and impairments:  Impaired UE functional use, Increased muscle spasms, Decreased activity tolerance, Pain, Decreased strength, Improper body mechanics  Visit Diagnosis: Chronic right shoulder pain     Problem List Patient Active Problem List   Diagnosis Date Noted  . Right shoulder pain 01/24/2015  . Glenohumeral arthritis 12/27/2014       Laureen Abrahams, PT, DPT 10/12/16 3:36 PM    [facility] [address] [place], [place], [address] Phone: [phone]   Fax:  [phone]  Name: Meagan Flores MRN: [id number] Date of Birth: 1966/06/20

## 2016-10-18 ENCOUNTER — Ambulatory Visit: Payer: BLUE CROSS/BLUE SHIELD | Admitting: Physical Therapy

## 2016-10-18 ENCOUNTER — Encounter: Payer: Self-pay | Admitting: Physical Therapy

## 2016-10-18 DIAGNOSIS — M25511 Pain in right shoulder: Secondary | ICD-10-CM | POA: Diagnosis not present

## 2016-10-18 DIAGNOSIS — G8929 Other chronic pain: Secondary | ICD-10-CM

## 2016-10-18 NOTE — Therapy (Signed)
[place], [place], [address] Phone: [phone]   Fax:  [phone]  Physical Therapy Treatment  Patient Details  Name: Meagan Flores MRN: [id number] Date of Birth: [birth year] Referring Provider: Enid Baas Schmitz,MD  Encounter Date: 10/18/2016      PT End of Session - 10/18/16 1502    Visit Number 3   Number of Visits 13   Date for PT Re-Evaluation 11/19/16   Authorization Type BCBS- 30 visit limit   PT Start Time 1502   PT Stop Time 1547   PT Time Calculation (min) 45 min      Past Medical History:  Diagnosis Date  . Thyroid disease     Past Surgical History:  Procedure Laterality Date  . PARTIAL HYSTERECTOMY    . right arm sx    . THYROIDECTOMY      There were no vitals filed for this visit.      Subjective Assessment - 10/18/16 1502    Subjective Severe pain, unable to lift. did not sleep last night. feels like the whole arm is going to fall off when she lifts it.    Currently in Pain? Yes   Pain Score 8    Pain Location Shoulder   Pain Orientation Right                         [facility] Adult PT Treatment/Exercise - 10/18/16 0001      Shoulder Exercises: Supine   Flexion 10 reps   Flexion Limitations bilat hands on wand     Modalities   Modalities Iontophoresis     Electrical Stimulation   Electrical Stimulation Location R GHJ   Electrical Stimulation Action IFC   Electrical Stimulation Parameters 15' concurrent with heat     Iontophoresis   Type of Iontophoresis Dexamethasone   Location R GHJ   Dose 1cc   Time 8 min     Manual Therapy   Manual Therapy --   Joint Mobilization post capsule at end range flx, joint play in open pack, gr 2 long axis UE traction   Passive ROM R GHJ                PT Education - 10/18/16 1515    Education provided Yes   Education Details POC, exercise form/rationale, modalities in conjunction with exercise   Person(s) Educated Patient   Methods Explanation;Demonstration;Tactile cues;Verbal cues   Comprehension Verbalized understanding;Returned demonstration;Verbal cues required;Tactile cues required;Need further instruction          PT Short Term Goals - 10/07/16 1609      PT SHORT TERM GOAL #1   Title Pt will demo 10 deg increase in flexion and abd ROM to demo improved functional movement by 12/8   Baseline see flowsheet   Time 4   Period Weeks   Status New     PT SHORT TERM GOAL #2   Title 10lb increase in grip strength for improved function   Baseline see flowsheet   Time 4   Period Weeks   Status New           PT Long Term Goals - 10/07/16 1613      PT LONG TERM GOAL #1   Title Pt will be able to comb her hair using her R hana GHJ pain <=3/10 to improve self care ability by 12/22   Baseline unable at eval   Time 6  Period Weeks   Status New     PT LONG TERM GOAL #2   Title Pt will be able to return to performing some household chores utilizing R arm   Baseline unable to do household chores at eval   Time 6   Period Weeks   Status New     PT LONG TERM GOAL #3   Title Pt will be able to reach across in a horizontal ADD movement to improve self care activity ability pain <=3/10   Baseline severe pain/unable at eval   Time 6   Period Weeks   Status New     PT LONG TERM GOAL #4   Title FOTO to 62% ability to indicate significant improvement in functional ability   Baseline 34% at eval   Time 6   Period Weeks               Plan - 10/18/16 1516    Clinical Impression Statement Pt verbalized severe pain and was very fatigued due to lack of sleep. Performed ESTIM prior to treatment in attempt to decrease pain for exercises.  Is retrning to MD next week. I told the pt we would try PT for 2 more weeks to determine what effect it is having on her shoulder and if it is effective in improving functional activities. Able to actively flex arm to 90 at the end of  the session with less pain than beginning.    PT Next Visit Plan PROM, periscapular stabs-isometrics   Consulted and Agree with Plan of Care Patient      Patient will benefit from skilled therapeutic intervention in order to improve the following deficits and impairments:     Visit Diagnosis: Chronic right shoulder pain     Problem List Patient Active Problem List   Diagnosis Date Noted  . Right shoulder pain 01/24/2015  . Glenohumeral arthritis 12/27/2014   Marcin Holte C. Tayte Mcwherter PT, DPT 10/18/16 4:56 PM   [place] [facility] [address] [place], [place], [address] Phone: [phone]   Fax:  [phone]  Name: Meagan Flores MRN: [id number] Date of Birth: 11/30/1965

## 2016-10-19 ENCOUNTER — Ambulatory Visit: Payer: BLUE CROSS/BLUE SHIELD | Admitting: Physical Therapy

## 2016-10-19 ENCOUNTER — Encounter: Payer: Self-pay | Admitting: Physical Therapy

## 2016-10-19 DIAGNOSIS — M25511 Pain in right shoulder: Principal | ICD-10-CM

## 2016-10-19 DIAGNOSIS — G8929 Other chronic pain: Secondary | ICD-10-CM

## 2016-10-19 NOTE — Therapy (Addendum)
[facility] [address] [place], [place], [address] Phone: [phone]   Fax:  [phone]  Physical Therapy Treatment/Discharge Summary  Patient Details  Name: Meagan Flores MRN: [id number] Date of Birth: 07/04/1966 Referring Provider: Marye Round Schmitz,MD  Encounter Date: 10/19/2016      PT End of Session - 10/19/16 1547    Visit Number 4   Number of Visits 13   Date for PT Re-Evaluation 11/19/16   Authorization Type BCBS- 30 visit limit   PT Start Time 1545   PT Stop Time 1638   PT Time Calculation (min) 53 min   Activity Tolerance Patient limited by pain   Behavior During Therapy [facility] for tasks assessed/performed      Past Medical History:  Diagnosis Date  . Thyroid disease     Past Surgical History:  Procedure Laterality Date  . PARTIAL HYSTERECTOMY    . right arm sx    . THYROIDECTOMY      There were no vitals filed for this visit.      Subjective Assessment - 10/19/16 1545    Subjective shoulder still hurts but she has been trying to move it since yesterday to keep it from getting stiff again.    Patient Stated Goals reaching overhead, comb hair, reaching across body, household chores   Currently in Pain? Yes   Pain Score 7    Pain Location Shoulder   Pain Orientation Right   Pain Descriptors / Indicators Sore   Aggravating Factors  using arm   Pain Relieving Factors pain patches                         [facility] Adult PT Treatment/Exercise - 10/19/16 0001      Shoulder Exercises: Seated   Retraction Both;10 reps  10s holds   Other Seated Exercises seated scap retraction + iso triceps ext into thighs     Shoulder Exercises: Pulleys   Flexion 3 minutes     Shoulder Exercises: ROM/Strengthening   Other ROM/Strengthening Exercises ranger flx 3 min     Shoulder Exercises: Isometric Strengthening   Flexion Other (comment)  10*5s   Extension Other (comment)  10*5s   ADduction  Other (comment)  10*5s   ADduction Limitations squeeze towel     Hand Exercises for Cervical Radiculopathy   Other Hand Exercise for Cervical Radiculopathy 3' putty work     Emergency planning/management officer R GHJ   Statistician Action IFC   Electrical Stimulation Parameters 15' concurrent with heat   Electrical Stimulation Goals Pain     Manual Therapy   Manual Therapy Myofascial release   Joint Mobilization post capsule at end range flx, joint play in open pack, gr 2 long axis UE traction   Myofascial Release R subscapularis   Passive ROM R GHJ                PT Education - 10/19/16 1547    Education provided Yes   Education Details exercise form/rationale, progression and POC, HEP   Person(s) Educated Patient   Methods Explanation;Demonstration;Tactile cues;Verbal cues   Comprehension Verbalized understanding;Returned demonstration;Verbal cues required;Tactile cues required;Need further instruction          PT Short Term Goals - 10/07/16 1609      PT SHORT TERM GOAL #1   Title Pt will demo 10 deg increase in flexion and abd ROM to demo improved functional movement by  12/8   Baseline see flowsheet   Time 4   Period Weeks   Status New     PT SHORT TERM GOAL #2   Title 10lb increase in grip strength for improved function   Baseline see flowsheet   Time 4   Period Weeks   Status New           PT Long Term Goals - 10/07/16 1613      PT LONG TERM GOAL #1   Title Pt will be able to comb her hair using her R hana GHJ pain <=3/10 to improve self care ability by 12/22   Baseline unable at eval   Time 6   Period Weeks   Status New     PT LONG TERM GOAL #2   Title Pt will be able to return to performing some household chores utilizing R arm   Baseline unable to do household chores at eval   Time 6   Period Weeks   Status New     PT LONG TERM GOAL #3   Title Pt will be able to reach across in a horizontal ADD  movement to improve self care activity ability pain <=3/10   Baseline severe pain/unable at eval   Time 6   Period Weeks   Status New     PT LONG TERM GOAL #4   Title FOTO to 62% ability to indicate significant improvement in functional ability   Baseline 34% at eval   Time 6   Period Weeks               Plan - 10/19/16 1625    Clinical Impression Statement Significantly limited by pain in her shoulder today. Was able to passively move her shouler to 90 deg flx in supine following subscap release. Pt reports significant HA pain that has begun since starting nitroglycerine patch. I advised her to disuss HA pain with MD.    PT Next Visit Plan PROM, periscapular stabs-isometrics   Consulted and Agree with Plan of Care Patient      Patient will benefit from skilled therapeutic intervention in order to improve the following deficits and impairments:     Visit Diagnosis: Chronic right shoulder pain     Problem List Patient Active Problem List   Diagnosis Date Noted  . Right shoulder pain 01/24/2015  . Glenohumeral arthritis 12/27/2014    Ileane Sando C. Seylah Wernert PT, DPT 10/19/16 4:27 PM   [place] [address] [place], [place], [address] Phone: [phone]   Fax:  [phone]  Name: Meagan Flores MRN: [id number] Date of Birth: 11-Jan-1966  PHYSICAL THERAPY DISCHARGE SUMMARY  Visits from Start of Care: 4  Current functional level related to goals / functional outcomes: See above   Remaining deficits: See above   Education / Equipment: Anatomy of condition, POC, HEP, exercise form/raionale  Plan: Patient agrees to discharge.  Patient goals were not met. Patient is being discharged due to not returning since the last visit.  ?????    Spoke with pt on the phone 12/4 3:55 pm. She reports she is not planning to return to PT and is going to have an MRI.  Charlii Yost C. Fatumata Kashani PT, DPT 11/01/16 3:58 PM

## 2016-10-25 ENCOUNTER — Ambulatory Visit: Payer: BLUE CROSS/BLUE SHIELD | Admitting: Physical Therapy

## 2016-10-26 ENCOUNTER — Ambulatory Visit (INDEPENDENT_AMBULATORY_CARE_PROVIDER_SITE_OTHER): Payer: BLUE CROSS/BLUE SHIELD | Admitting: Family Medicine

## 2016-10-26 ENCOUNTER — Encounter: Payer: Self-pay | Admitting: Family Medicine

## 2016-10-26 ENCOUNTER — Other Ambulatory Visit: Payer: Self-pay | Admitting: *Deleted

## 2016-10-26 VITALS — BP 131/85 | HR 67 | Ht 68.0 in | Wt 174.0 lb

## 2016-10-26 DIAGNOSIS — M25511 Pain in right shoulder: Secondary | ICD-10-CM

## 2016-10-26 NOTE — Progress Notes (Signed)
  Meagan Flores - 50 y.o. female MRN [id number]  Date of birth: 05/22/1966  SUBJECTIVE:  Including CC & ROS.   Meagan Flores is a 50 year old female that is following up for right shoulder pain. She is going to physical therapy hasn't no improvement of her pain. She reports that physical therapy seems to exacerbate her pain. She has also tried nitroglycerin patches would seem to be causing her headaches on a daily occurrence with no improvement. She feels like the pain is going through her shoulder and feels like an ache. She works at a [employer]. 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 She has received at least four subacromial injections into her shoulder.   ROS: No unexpected weight loss, fever, chills, swelling, instability, numbness/tingling, redness, otherwise see HPI    HISTORY: Past Medical, Surgical, Social, and Family History Reviewed & Updated per EMR.   Pertinent Historical Findings include: PMSHx -  hypothyroidism  DATA REVIEWED: 12/11/14. Right shoulder x-ray: no fracture 12/27/14 right shoulder [place]: The above findings are consistent with subscapularis chronic tear, mild degenerative changes of the [facility] joint.   PHYSICAL EXAM:  VS: BP:131/85  HR:67bpm  TEMP: ( )  RESP:   HT:5\' 8"  (172.7 cm)   WT:174 lb (78.9 kg)  BMI:26.5 PHYSICAL EXAM: Gen: NAD, alert, cooperative with exam, well-appearing HEENT: clear conjunctiva, EOMI CV:  no edema, capillary refill brisk,  Resp: non-labored, normal speech Skin: no rashes, normal turgor  Neuro: no gross deficits.  Psych:  alert and oriented Right shoulder: No TTP of the AC joint  No TTP of the BT  Able to achieve flexion and abduction to 90 degrees Full flexion and abduction passively .  Normal external rotation.  Normal strength to resistance to IR and ER  Pain with empty can test  Pain with Hawkin's test  Neurovascularly intact   ASSESSMENT & PLAN:   Right shoulder pain Having no  improvement with PT or nitro. A chronic tear has been observed on [place] previously. Avoiding any injections as she has received multiples subacromial injections.  - MRI right shoulder  - will call with results.

## 2016-10-27 ENCOUNTER — Ambulatory Visit: Payer: BLUE CROSS/BLUE SHIELD | Admitting: Physical Therapy

## 2016-10-27 NOTE — Assessment & Plan Note (Signed)
Having no improvement with PT or nitro. A chronic tear has been observed on [place] previously. Avoiding any injections as she has received multiples subacromial injections.  - MRI right shoulder  - will call with results.

## 2016-11-01 ENCOUNTER — Ambulatory Visit: Payer: BLUE CROSS/BLUE SHIELD | Admitting: Physical Therapy

## 2016-11-03 ENCOUNTER — Other Ambulatory Visit: Payer: Self-pay | Admitting: Family Medicine

## 2016-11-03 ENCOUNTER — Ambulatory Visit: Payer: BLUE CROSS/BLUE SHIELD | Admitting: Physical Therapy

## 2016-11-05 ENCOUNTER — Other Ambulatory Visit: Payer: BLUE CROSS/BLUE SHIELD

## 2016-11-09 ENCOUNTER — Encounter: Payer: Self-pay | Admitting: *Deleted

## 2016-11-09 DIAGNOSIS — M25511 Pain in right shoulder: Secondary | ICD-10-CM

## 2016-11-09 NOTE — Patient Instructions (Addendum)
Your Insurance will not cover your MRI for your shoulder. The recommend we send you to a specialist to have it further evaluated.  [facility] [address] 11-11-16 at [place], [place], [place] [address] Phone: [phone]

## 2016-11-11 ENCOUNTER — Encounter (INDEPENDENT_AMBULATORY_CARE_PROVIDER_SITE_OTHER): Payer: Self-pay | Admitting: Orthopaedic Surgery

## 2016-11-11 ENCOUNTER — Ambulatory Visit (INDEPENDENT_AMBULATORY_CARE_PROVIDER_SITE_OTHER): Payer: BLUE CROSS/BLUE SHIELD

## 2016-11-11 ENCOUNTER — Ambulatory Visit (INDEPENDENT_AMBULATORY_CARE_PROVIDER_SITE_OTHER): Payer: BLUE CROSS/BLUE SHIELD | Admitting: Orthopaedic Surgery

## 2016-11-11 VITALS — Ht 68.0 in | Wt 190.0 lb

## 2016-11-11 DIAGNOSIS — G8929 Other chronic pain: Secondary | ICD-10-CM | POA: Diagnosis not present

## 2016-11-11 DIAGNOSIS — M25511 Pain in right shoulder: Secondary | ICD-10-CM | POA: Diagnosis not present

## 2016-11-11 NOTE — Progress Notes (Signed)
Office Visit Note   Patient: Meagan Flores           Date of Birth: 29-Jul-1966           MRN: [id number] Visit Date: 11/11/2016              Requested by: Lance Bosch, NP No address on file PCP: Lance Bosch, NP   Assessment & Plan: Visit Diagnoses:  1. Chronic right shoulder pain     Plan: Impression is right rotator cuff tear. We will reorder the right shoulder MRI to rule out rotator cuff tear. Work note provided today.  Follow-Up Instructions: Return in about 2 weeks (around 11/25/2016) for review right shoulder MRI.   Orders:  Orders Placed This Encounter  Procedures  . XR Shoulder Right  . MR SHOULDER RIGHT WO CONTRAST   No orders of the defined types were placed in this encounter.     Procedures: No procedures performed   Clinical Data: No additional findings.   Subjective: Chief Complaint  Patient presents with  . Right Shoulder - Pain    Patient is a new 50 year old female with right shoulder pain for over a year and has had multiple subacromial injections by the sports medicine group. She has constant pain in her right shoulder that is worse at night and is rated at 6 out of 10. She also endorses numbness and pain with raising of her arm which does make it worse. He denies any constitutional symptoms.    Review of Systems  Constitutional: Negative.   HENT: Negative.   Eyes: Negative.   Respiratory: Negative.   Cardiovascular: Negative.   Endocrine: Negative.   Musculoskeletal: Negative.   Neurological: Negative.   Hematological: Negative.   Psychiatric/Behavioral: Negative.   All other systems reviewed and are negative.    Objective: Vital Signs: Ht 5\' 8"  (1.727 m)   Wt 190 lb (86.2 kg)   BMI 28.89 kg/m   Physical Exam  Constitutional: She is oriented to person, place, and time. She appears well-developed and well-nourished.  HENT:  Head: Atraumatic.  Eyes: EOM are normal.  Neck: Neck supple.  Cardiovascular: Intact distal  pulses.   Pulmonary/Chest: Effort normal.  Abdominal: Soft.  Neurological: She is alert and oriented to person, place, and time.  Skin: Skin is warm. Capillary refill takes less than 2 seconds.  Psychiatric: She has a normal mood and affect. Her behavior is normal. Judgment and thought content normal.  Nursing note and vitals reviewed.   Ortho Exam Exam of the right shoulder shows a positive empty can, positive impingement positive cross adduction sign. Specialty Comments:  No specialty comments available.  Imaging: Xr Shoulder Right  Result Date: 11/11/2016 No acute findings    PMFS History: Patient Active Problem List   Diagnosis Date Noted  . Right shoulder pain 01/24/2015  . Glenohumeral arthritis 12/27/2014   Past Medical History:  Diagnosis Date  . Thyroid disease     History reviewed. No pertinent family history.  Past Surgical History:  Procedure Laterality Date  . PARTIAL HYSTERECTOMY    . right arm sx    . THYROIDECTOMY     Social History   Occupational History  . Not on file.   Social History Main Topics  . Smoking status: Current Every Day Smoker    Packs/day: 0.50    Years: 34.00  . Smokeless tobacco: Never Used  . Alcohol use Yes     Comment: occassional  . Drug use:  No  . Sexual activity: Yes    Birth control/ protection: Surgical

## 2016-11-15 ENCOUNTER — Other Ambulatory Visit: Payer: BLUE CROSS/BLUE SHIELD

## 2016-11-26 ENCOUNTER — Ambulatory Visit (INDEPENDENT_AMBULATORY_CARE_PROVIDER_SITE_OTHER): Payer: BLUE CROSS/BLUE SHIELD | Admitting: Orthopaedic Surgery

## 2016-11-26 ENCOUNTER — Encounter (INDEPENDENT_AMBULATORY_CARE_PROVIDER_SITE_OTHER): Payer: Self-pay

## 2016-12-21 ENCOUNTER — Ambulatory Visit
Admission: RE | Admit: 2016-12-21 | Discharge: 2016-12-21 | Disposition: A | Discharge status: Home / Self Care | Payer: BLUE CROSS/BLUE SHIELD | Source: Ambulatory Visit | Attending: Orthopaedic Surgery | Admitting: Orthopaedic Surgery

## 2016-12-21 DIAGNOSIS — G8929 Other chronic pain: Secondary | ICD-10-CM

## 2016-12-21 DIAGNOSIS — M25511 Pain in right shoulder: Principal | ICD-10-CM

## 2016-12-24 ENCOUNTER — Ambulatory Visit (INDEPENDENT_AMBULATORY_CARE_PROVIDER_SITE_OTHER): Payer: BLUE CROSS/BLUE SHIELD | Admitting: Orthopaedic Surgery

## 2016-12-24 ENCOUNTER — Encounter (INDEPENDENT_AMBULATORY_CARE_PROVIDER_SITE_OTHER): Payer: Self-pay | Admitting: Orthopaedic Surgery

## 2016-12-24 DIAGNOSIS — M7541 Impingement syndrome of right shoulder: Secondary | ICD-10-CM | POA: Insufficient documentation

## 2016-12-24 DIAGNOSIS — M75111 Incomplete rotator cuff tear or rupture of right shoulder, not specified as traumatic: Secondary | ICD-10-CM | POA: Diagnosis not present

## 2016-12-24 HISTORY — DX: Impingement syndrome of right shoulder: M75.41

## 2016-12-24 NOTE — Progress Notes (Signed)
   Office Visit Note   Patient: Meagan Flores           Date of Birth: Nov 25, 1966           MRN: [id number] Visit Date: 12/24/2016              Requested by: Lance Bosch, NP No address on file PCP: Lance Bosch, NP   Assessment & Plan: Visit Diagnoses:  1. Impingement syndrome of right shoulder   2. Nontraumatic incomplete tear of right rotator cuff     Plan: MRI reviewed shows intrasubstance tear of the supraspinatus with acromioclavicular joint arthropathy and impingement. At this point patient would like to pursue surgical treatment with debridement as indicated and possible rotator cuff repair and subacromial decompression and distal clavicle excision. She understands the risks benefits alternatives to surgery.  Follow-Up Instructions: Return for 2 week postop visit.   Orders:  No orders of the defined types were placed in this encounter.  No orders of the defined types were placed in this encounter.     Procedures: No procedures performed   Clinical Data: No additional findings.   Subjective: Chief Complaint  Patient presents with  . Right Shoulder - Pain, Follow-up    Patient is here to review her MRI. Her shoulder is feeling the same.    Review of Systems   Objective: Vital Signs: There were no vitals taken for this visit.  Physical Exam  Ortho Exam Exam of the shoulder shows pain with empty can testing. She has positive impingement signs and positive cross adduction sign. Specialty Comments:  No specialty comments available.  Imaging: No results found.   PMFS History: Patient Active Problem List   Diagnosis Date Noted  . Nontraumatic incomplete tear of right rotator cuff 12/24/2016  . Impingement syndrome of right shoulder 12/24/2016  . Right shoulder pain 01/24/2015  . Glenohumeral arthritis 12/27/2014   Past Medical History:  Diagnosis Date  . Thyroid disease     No family history on file.  Past Surgical History:  Procedure  Laterality Date  . PARTIAL HYSTERECTOMY    . right arm sx    . THYROIDECTOMY     Social History   Occupational History  . Not on file.   Social History Main Topics  . Smoking status: Current Every Day Smoker    Packs/day: 0.50    Years: 34.00  . Smokeless tobacco: Never Used  . Alcohol use Yes     Comment: occassional  . Drug use: No  . Sexual activity: Yes    Birth control/ protection: Surgical

## 2017-01-03 ENCOUNTER — Telehealth (INDEPENDENT_AMBULATORY_CARE_PROVIDER_SITE_OTHER): Payer: Self-pay | Admitting: Orthopaedic Surgery

## 2017-01-03 NOTE — Telephone Encounter (Signed)
See message below °

## 2017-01-03 NOTE — Telephone Encounter (Signed)
Patient called stating that she needed to talk to Dr. Erlinda Hong regarding her surgery.  She stated that there are a lot of people calling her phone in regards to surgery and she needs some clarification.  Cb#[phone].  Thank you

## 2017-01-04 NOTE — Telephone Encounter (Signed)
Please call her asap to discuss financial issues.  She has questions.

## 2017-01-05 ENCOUNTER — Other Ambulatory Visit (INDEPENDENT_AMBULATORY_CARE_PROVIDER_SITE_OTHER): Payer: Self-pay | Admitting: Orthopaedic Surgery

## 2017-01-05 DIAGNOSIS — M75101 Unspecified rotator cuff tear or rupture of right shoulder, not specified as traumatic: Secondary | ICD-10-CM

## 2017-01-06 ENCOUNTER — Encounter (HOSPITAL_BASED_OUTPATIENT_CLINIC_OR_DEPARTMENT_OTHER): Payer: Self-pay | Admitting: *Deleted

## 2017-01-06 NOTE — Telephone Encounter (Signed)
Spoke with patient and rescheduled to [place].

## 2017-01-11 ENCOUNTER — Other Ambulatory Visit (INDEPENDENT_AMBULATORY_CARE_PROVIDER_SITE_OTHER): Payer: Self-pay | Admitting: Orthopaedic Surgery

## 2017-01-11 NOTE — Anesthesia Preprocedure Evaluation (Addendum)
Anesthesia Evaluation  Patient identified by MRN, date of birth, ID band Patient awake    Reviewed: Allergy & Precautions, Patient's Chart, lab work & pertinent test results  Airway Mallampati: III       Dental  (+) Teeth Intact, Dental Advisory Given   Pulmonary neg pulmonary ROS, Current Smoker,    breath sounds clear to auscultation       Cardiovascular negative cardio ROS   Rhythm:Regular Rate:Normal     Neuro/Psych negative neurological ROS  negative psych ROS   GI/Hepatic negative GI ROS, Neg liver ROS,   Endo/Other  Hypothyroidism   Renal/GU negative Renal ROS  negative genitourinary   Musculoskeletal  (+) Arthritis ,   Abdominal   Peds negative pediatric ROS (+)  Hematology negative hematology ROS (+)   Anesthesia Other Findings Day of surgery medications reviewed with the patient.  Reproductive/Obstetrics negative OB ROS                            Anesthesia Physical Anesthesia Plan  ASA: II  Anesthesia Plan: General   Post-op Pain Management: GA combined w/ Regional for post-op pain   Induction: Intravenous  Airway Management Planned: Oral ETT  Additional Equipment:   Intra-op Plan:   Post-operative Plan: Extubation in OR  Informed Consent: I have reviewed the patients History and Physical, chart, labs and discussed the procedure including the risks, benefits and alternatives for the proposed anesthesia with the patient or authorized representative who has indicated his/her understanding and acceptance.   Dental advisory given  Plan Discussed with: CRNA  Anesthesia Plan Comments: (glidescope on ready )       Anesthesia Quick Evaluation

## 2017-01-12 ENCOUNTER — Ambulatory Visit (HOSPITAL_BASED_OUTPATIENT_CLINIC_OR_DEPARTMENT_OTHER): Payer: BLUE CROSS/BLUE SHIELD | Admitting: Certified Registered"

## 2017-01-12 ENCOUNTER — Encounter (HOSPITAL_BASED_OUTPATIENT_CLINIC_OR_DEPARTMENT_OTHER): Payer: Self-pay | Admitting: Certified Registered"

## 2017-01-12 ENCOUNTER — Ambulatory Visit (HOSPITAL_BASED_OUTPATIENT_CLINIC_OR_DEPARTMENT_OTHER)
Admission: RE | Admit: 2017-01-12 | Discharge: 2017-01-12 | Disposition: A | Discharge status: Home / Self Care | Payer: BLUE CROSS/BLUE SHIELD | Source: Ambulatory Visit | Attending: Orthopaedic Surgery | Admitting: Orthopaedic Surgery

## 2017-01-12 ENCOUNTER — Encounter (HOSPITAL_BASED_OUTPATIENT_CLINIC_OR_DEPARTMENT_OTHER)
Admission: RE | Disposition: A | Discharge status: Home / Self Care | Payer: Self-pay | Source: Ambulatory Visit | Attending: Orthopaedic Surgery

## 2017-01-12 DIAGNOSIS — M7541 Impingement syndrome of right shoulder: Secondary | ICD-10-CM | POA: Insufficient documentation

## 2017-01-12 DIAGNOSIS — Z90710 Acquired absence of both cervix and uterus: Secondary | ICD-10-CM | POA: Diagnosis not present

## 2017-01-12 DIAGNOSIS — M65811 Other synovitis and tenosynovitis, right shoulder: Secondary | ICD-10-CM | POA: Insufficient documentation

## 2017-01-12 DIAGNOSIS — M7521 Bicipital tendinitis, right shoulder: Secondary | ICD-10-CM | POA: Insufficient documentation

## 2017-01-12 DIAGNOSIS — F17200 Nicotine dependence, unspecified, uncomplicated: Secondary | ICD-10-CM | POA: Insufficient documentation

## 2017-01-12 DIAGNOSIS — Z882 Allergy status to sulfonamides status: Secondary | ICD-10-CM | POA: Diagnosis not present

## 2017-01-12 DIAGNOSIS — M75101 Unspecified rotator cuff tear or rupture of right shoulder, not specified as traumatic: Secondary | ICD-10-CM | POA: Diagnosis not present

## 2017-01-12 DIAGNOSIS — S43431A Superior glenoid labrum lesion of right shoulder, initial encounter: Secondary | ICD-10-CM | POA: Diagnosis not present

## 2017-01-12 DIAGNOSIS — E890 Postprocedural hypothyroidism: Secondary | ICD-10-CM | POA: Insufficient documentation

## 2017-01-12 DIAGNOSIS — X58XXXA Exposure to other specified factors, initial encounter: Secondary | ICD-10-CM | POA: Insufficient documentation

## 2017-01-12 DIAGNOSIS — Z79899 Other long term (current) drug therapy: Secondary | ICD-10-CM | POA: Diagnosis not present

## 2017-01-12 DIAGNOSIS — Y939 Activity, unspecified: Secondary | ICD-10-CM | POA: Insufficient documentation

## 2017-01-12 HISTORY — PX: SHOULDER ARTHROSCOPY WITH DISTAL CLAVICLE RESECTION: SHX5675

## 2017-01-12 HISTORY — DX: Hypothyroidism, unspecified: E03.9

## 2017-01-12 SURGERY — SHOULDER ARTHROSCOPY WITH DISTAL CLAVICLE RESECTION
Anesthesia: General | Site: Shoulder | Laterality: Right

## 2017-01-12 MED ORDER — LACTATED RINGERS IV SOLN: INTRAVENOUS | Status: DC

## 2017-01-12 MED ORDER — SENNOSIDES-DOCUSATE SODIUM 8.6-50 MG PO TABS: 1.0000 | ORAL_TABLET | Freq: Every evening | ORAL | 1 refills | Status: DC | PRN

## 2017-01-12 MED ORDER — ESMOLOL HCL 100 MG/10ML IV SOLN
INTRAVENOUS | Status: DC | PRN
  Administered 2017-01-12 (×2): 10 mg via INTRAVENOUS

## 2017-01-12 MED ORDER — BUPIVACAINE-EPINEPHRINE (PF) 0.5% -1:200000 IJ SOLN
INTRAMUSCULAR | Status: DC | PRN
  Administered 2017-01-12: 15 mL via PERINEURAL

## 2017-01-12 MED ORDER — PROMETHAZINE HCL 25 MG/ML IJ SOLN: 6.2500 mg | INTRAMUSCULAR | Status: DC | PRN

## 2017-01-12 MED ORDER — LACTATED RINGERS IV SOLN
INTRAVENOUS | Status: DC
  Administered 2017-01-12 (×2): via INTRAVENOUS

## 2017-01-12 MED ORDER — ONDANSETRON HCL 4 MG/2ML IJ SOLN
INTRAMUSCULAR | Status: DC | PRN
  Administered 2017-01-12: 4 mg via INTRAVENOUS

## 2017-01-12 MED ORDER — HYDROCODONE-ACETAMINOPHEN 7.5-325 MG PO TABS: 1.0000 | ORAL_TABLET | Freq: Four times a day (QID) | ORAL | 0 refills | Status: DC | PRN

## 2017-01-12 MED ORDER — FENTANYL CITRATE (PF) 100 MCG/2ML IJ SOLN
INTRAMUSCULAR | Status: AC
  Filled 2017-01-12: qty 2

## 2017-01-12 MED ORDER — ONDANSETRON HCL 4 MG PO TABS: 4.0000 mg | ORAL_TABLET | Freq: Three times a day (TID) | ORAL | 0 refills | Status: DC | PRN

## 2017-01-12 MED ORDER — SUCCINYLCHOLINE CHLORIDE 20 MG/ML IJ SOLN
INTRAMUSCULAR | Status: DC | PRN
  Administered 2017-01-12: 100 mg via INTRAVENOUS

## 2017-01-12 MED ORDER — CEFAZOLIN SODIUM-DEXTROSE 2-4 GM/100ML-% IV SOLN
INTRAVENOUS | Status: AC
  Filled 2017-01-12: qty 100

## 2017-01-12 MED ORDER — MEPERIDINE HCL 25 MG/ML IJ SOLN: 6.2500 mg | INTRAMUSCULAR | Status: DC | PRN

## 2017-01-12 MED ORDER — BUPIVACAINE-EPINEPHRINE 0.5% -1:200000 IJ SOLN
INTRAMUSCULAR | Status: DC | PRN
  Administered 2017-01-12: 10 mL

## 2017-01-12 MED ORDER — OXYCODONE HCL 5 MG PO TABS: 5.0000 mg | ORAL_TABLET | Freq: Once | ORAL | Status: DC | PRN

## 2017-01-12 MED ORDER — MIDAZOLAM HCL 2 MG/2ML IJ SOLN
1.0000 mg | INTRAMUSCULAR | Status: DC | PRN
  Administered 2017-01-12: 2 mg via INTRAVENOUS

## 2017-01-12 MED ORDER — FENTANYL CITRATE (PF) 100 MCG/2ML IJ SOLN
50.0000 ug | INTRAMUSCULAR | Status: DC | PRN
  Administered 2017-01-12: 50 ug via INTRAVENOUS

## 2017-01-12 MED ORDER — PROMETHAZINE HCL 25 MG PO TABS: 25.0000 mg | ORAL_TABLET | Freq: Four times a day (QID) | ORAL | 1 refills | Status: DC | PRN

## 2017-01-12 MED ORDER — HYDROMORPHONE HCL 1 MG/ML IJ SOLN: 0.2500 mg | INTRAMUSCULAR | Status: DC | PRN

## 2017-01-12 MED ORDER — BUPIVACAINE-EPINEPHRINE (PF) 0.5% -1:200000 IJ SOLN
INTRAMUSCULAR | Status: AC
  Filled 2017-01-12: qty 30

## 2017-01-12 MED ORDER — METHOCARBAMOL 500 MG PO TABS: 500.0000 mg | ORAL_TABLET | Freq: Four times a day (QID) | ORAL | 2 refills | Status: DC | PRN

## 2017-01-12 MED ORDER — DEXAMETHASONE SODIUM PHOSPHATE 4 MG/ML IJ SOLN
INTRAMUSCULAR | Status: DC | PRN
  Administered 2017-01-12: 10 mg via INTRAVENOUS

## 2017-01-12 MED ORDER — SCOPOLAMINE 1 MG/3DAYS TD PT72: 1.0000 | MEDICATED_PATCH | Freq: Once | TRANSDERMAL | Status: DC | PRN

## 2017-01-12 MED ORDER — PROPOFOL 10 MG/ML IV BOLUS
INTRAVENOUS | Status: DC | PRN
  Administered 2017-01-12: 150 mg via INTRAVENOUS

## 2017-01-12 MED ORDER — MIDAZOLAM HCL 2 MG/2ML IJ SOLN
INTRAMUSCULAR | Status: AC
  Filled 2017-01-12: qty 2

## 2017-01-12 MED ORDER — CEFAZOLIN SODIUM-DEXTROSE 2-4 GM/100ML-% IV SOLN
2.0000 g | INTRAVENOUS | Status: AC
  Administered 2017-01-12: 2 g via INTRAVENOUS

## 2017-01-12 MED ORDER — SODIUM CHLORIDE 0.9 % IR SOLN
Status: DC | PRN
  Administered 2017-01-12: 3000 mL

## 2017-01-12 MED ORDER — OXYCODONE HCL 5 MG/5ML PO SOLN: 5.0000 mg | Freq: Once | ORAL | Status: DC | PRN

## 2017-01-12 SURGICAL SUPPLY — 64 items
BENZOIN TINCTURE PRP APPL 2/3 (GAUZE/BANDAGES/DRESSINGS) IMPLANT
BLADE 4.2CUDA (BLADE) ×2 IMPLANT
BLADE CUTTER GATOR 3.5 (BLADE) IMPLANT
BLADE GREAT WHITE 4.2 (BLADE) IMPLANT
BLADE SURG 15 STRL LF DISP TIS (BLADE) IMPLANT
BLADE SURG 15 STRL SS (BLADE)
BUR OVAL 4.0 (BURR) ×2 IMPLANT
CANNULA 5.75X71 LONG (CANNULA) ×2 IMPLANT
CANNULA TWIST IN 8.25X7CM (CANNULA) IMPLANT
CANNULA TWIST IN 8.25X9CM (CANNULA) IMPLANT
CLSR STERI-STRIP ANTIMIC 1/2X4 (GAUZE/BANDAGES/DRESSINGS) IMPLANT
DECANTER SPIKE VIAL GLASS SM (MISCELLANEOUS) IMPLANT
DRAPE IMP U-DRAPE 54X76 (DRAPES) ×2 IMPLANT
DRAPE INCISE IOBAN 66X45 STRL (DRAPES) ×2 IMPLANT
DRAPE STERI 35X30 U-POUCH (DRAPES) ×2 IMPLANT
DRAPE SURG 17X23 STRL (DRAPES) ×2 IMPLANT
DRAPE U-SHAPE 47X51 STRL (DRAPES) ×4 IMPLANT
DRAPE U-SHAPE 76X120 STRL (DRAPES) ×4 IMPLANT
DRSG PAD ABDOMINAL 8X10 ST (GAUZE/BANDAGES/DRESSINGS) ×4 IMPLANT
DURAPREP 26ML APPLICATOR (WOUND CARE) ×4 IMPLANT
ELECT REM PT RETURN 9FT ADLT (ELECTROSURGICAL) ×2
ELECTRODE REM PT RTRN 9FT ADLT (ELECTROSURGICAL) ×1 IMPLANT
GAUZE SPONGE 4X4 12PLY STRL (GAUZE/BANDAGES/DRESSINGS) ×2 IMPLANT
GAUZE XEROFORM 1X8 LF (GAUZE/BANDAGES/DRESSINGS) ×2 IMPLANT
GLOVE BIO SURGEON STRL SZ 6.5 (GLOVE) ×4 IMPLANT
GLOVE BIOGEL PI IND STRL 7.0 (GLOVE) ×1 IMPLANT
GLOVE BIOGEL PI IND STRL 8 (GLOVE) ×1 IMPLANT
GLOVE BIOGEL PI INDICATOR 7.0 (GLOVE) ×1
GLOVE BIOGEL PI INDICATOR 8 (GLOVE) ×1
GLOVE SKINSENSE NS SZ7.5 (GLOVE) ×1
GLOVE SKINSENSE STRL SZ7.5 (GLOVE) ×1 IMPLANT
GLOVE SURG SS PI 7.0 STRL IVOR (GLOVE) ×2 IMPLANT
GLOVE SURG SYN 7.5  E (GLOVE) ×1
GLOVE SURG SYN 7.5 E (GLOVE) ×1 IMPLANT
GOWN STRL REIN XL XLG (GOWN DISPOSABLE) ×2 IMPLANT
GOWN STRL REUS W/ TWL LRG LVL3 (GOWN DISPOSABLE) ×1 IMPLANT
GOWN STRL REUS W/TWL LRG LVL3 (GOWN DISPOSABLE) ×1
IMMOBILIZER SHOULDER FOAM XLGE (SOFTGOODS) IMPLANT
IV NS IRRIG 3000ML ARTHROMATIC (IV SOLUTION) ×4 IMPLANT
KIT SHOULDER TRACTION (DRAPES) ×2 IMPLANT
MANIFOLD NEPTUNE II (INSTRUMENTS) ×2 IMPLANT
NEEDLE SCORPION MULTI FIRE (NEEDLE) IMPLANT
PACK ARTHROSCOPY DSU (CUSTOM PROCEDURE TRAY) ×2 IMPLANT
PACK BASIN DAY SURGERY FS (CUSTOM PROCEDURE TRAY) ×2 IMPLANT
PROBE BIPOLAR ATHRO 135MM 90D (MISCELLANEOUS) ×4 IMPLANT
SET ARTHROSCOPY TUBING (MISCELLANEOUS) ×1
SET ARTHROSCOPY TUBING LN (MISCELLANEOUS) ×1 IMPLANT
SHEET MEDIUM DRAPE 40X70 STRL (DRAPES) ×2 IMPLANT
SLEEVE SCD COMPRESS KNEE MED (MISCELLANEOUS) ×2 IMPLANT
SLING ARM FOAM STRAP LRG (SOFTGOODS) IMPLANT
SLING ARM IMMOBILIZER LRG (SOFTGOODS) IMPLANT
SLING ARM IMMOBILIZER MED (SOFTGOODS) IMPLANT
SLING ARM MED ADULT FOAM STRAP (SOFTGOODS) IMPLANT
SLING ARM XL FOAM STRAP (SOFTGOODS) IMPLANT
SUT ETHILON 3 0 PS 1 (SUTURE) ×2 IMPLANT
SUT FIBERWIRE #2 38 T-5 BLUE (SUTURE)
SUT TIGER TAPE 7 IN WHITE (SUTURE) IMPLANT
SUTURE FIBERWR #2 38 T-5 BLUE (SUTURE) IMPLANT
SYR 50ML LL SCALE MARK (SYRINGE) IMPLANT
TAPE FIBER 2MM 7IN #2 BLUE (SUTURE) IMPLANT
TOWEL OR 17X24 6PK STRL BLUE (TOWEL DISPOSABLE) ×2 IMPLANT
TOWEL OR NON WOVEN STRL DISP B (DISPOSABLE) ×2 IMPLANT
TUBE CONNECTING 20X1/4 (TUBING) IMPLANT
WATER STERILE IRR 1000ML POUR (IV SOLUTION) ×2 IMPLANT

## 2017-01-12 NOTE — Progress Notes (Signed)
Assisted Dr. Hollis with right, ultrasound guided, supraclavicular block. Side rails up, monitors on throughout procedure. See vital signs in flow sheet. Tolerated Procedure well. 

## 2017-01-12 NOTE — Transfer of Care (Signed)
Immediate Anesthesia Transfer of Care Note  Patient: Meagan Flores  Procedure(s) Performed: Procedure(s): RIGHT SHOULDER ARTHROSCOPY WITH  SUBACROMIAL DECOMPRESSION, DISTAL CLAVICLE EXCISION, extensive debridement (Right)  Patient Location: PACU  Anesthesia Type:GA combined with regional for post-op pain  Level of Consciousness: awake, alert , oriented and patient cooperative  Airway & Oxygen Therapy: Patient Spontanous Breathing and Patient connected to face mask oxygen  Post-op Assessment: Report given to RN and Post -op Vital signs reviewed and stable  Post vital signs: Reviewed and stable  Last Vitals:  Vitals:   01/12/17 0727 01/12/17 1026  BP: 114/80 119/80  Pulse: 60 88  Resp: 16 14  Temp:      Last Pain:  Vitals:   01/12/17 0727  TempSrc:   PainSc: 3          Complications: No apparent anesthesia complications

## 2017-01-12 NOTE — H&P (Signed)
    PREOPERATIVE H&P  Chief Complaint: right shoulder rotator cuff tear and impingement syndrome  HPI: Meagan Flores is a 51 y.o. female who presents for surgical treatment of right shoulder rotator cuff tear and impingement syndrome.  She denies any changes in medical history.  Past Medical History:  Diagnosis Date  . Hypothyroidism   . Thyroid disease    Past Surgical History:  Procedure Laterality Date  . PARTIAL HYSTERECTOMY    . right arm sx    . THYROIDECTOMY     Social History   Social History  . Marital status: Married    Spouse name: N/A  . Number of children: N/A  . Years of education: N/A   Social History Main Topics  . Smoking status: Current Every Day Smoker    Packs/day: 0.50    Years: 34.00  . Smokeless tobacco: Never Used  . Alcohol use Yes     Comment: occassional  . Drug use: No  . Sexual activity: Yes    Birth control/ protection: Surgical   Other Topics Concern  . None   Social History Narrative  . None   History reviewed. No pertinent family history. Allergies  Allergen Reactions  . Sulfa Antibiotics Hives   Prior to Admission medications   Medication Sig Start Date End Date Taking? Authorizing Provider  levothyroxine (SYNTHROID, LEVOTHROID) 125 MCG tablet Take 1 tablet (125 mcg total) by mouth daily before breakfast. 01/28/14  Yes Reyne Dumas, MD  naproxen (NAPROSYN) 500 MG tablet Take 1 tablet (500 mg total) by mouth 2 (two) times daily as needed for mild pain or moderate pain. 07/10/16  Yes Antonietta Breach, PA-C     Positive ROS: All other systems have been reviewed and were otherwise negative with the exception of those mentioned in the HPI and as above.  Physical Exam: General: Alert, no acute distress Cardiovascular: No pedal edema Respiratory: No cyanosis, no use of accessory musculature GI: abdomen soft Skin: No lesions in the area of chief complaint Neurologic: Sensation intact distally Psychiatric: Patient is competent for  consent with normal mood and affect Lymphatic: no lymphedema  MUSCULOSKELETAL: exam stable  Assessment: right shoulder rotator cuff tear and impingement syndrome  Plan: Plan for Procedure(s): RIGHT SHOULDER ARTHROSCOPY WITH ROTATOR CUFF REPAIR AND SUBACROMIAL DECOMPRESSION, DISTAL CLAVICLE EXCISION  The risks benefits and alternatives were discussed with the patient including but not limited to the risks of nonoperative treatment, versus surgical intervention including infection, bleeding, nerve injury,  blood clots, cardiopulmonary complications, morbidity, mortality, among others, and they were willing to proceed.   Eduard Roux, MD   01/12/2017 8:16 AM

## 2017-01-12 NOTE — Anesthesia Postprocedure Evaluation (Addendum)
Anesthesia Post Note  Patient: Meagan Flores  Procedure(s) Performed: Procedure(s) (LRB): RIGHT SHOULDER ARTHROSCOPY WITH  SUBACROMIAL DECOMPRESSION, DISTAL CLAVICLE EXCISION, extensive debridement (Right)  Patient location during evaluation: PACU Anesthesia Type: General Level of consciousness: awake and alert Pain management: pain level controlled Vital Signs Assessment: post-procedure vital signs reviewed and stable Respiratory status: spontaneous breathing, nonlabored ventilation, respiratory function stable and patient connected to nasal cannula oxygen Cardiovascular status: blood pressure returned to baseline and stable Postop Assessment: no signs of nausea or vomiting Anesthetic complications: no       Last Vitals:  Vitals:   01/12/17 1100 01/12/17 1138  BP: 119/75 132/87  Pulse: 83 74  Resp: 18 18  Temp:  36.7 C    Last Pain:  Vitals:   01/12/17 1138  TempSrc: Oral  PainSc: 0-No pain                 Effie Berkshire

## 2017-01-12 NOTE — Discharge Instructions (Signed)
Post-operative patient instructions  Shoulder Arthroscopy    Ice:  Place intermittent ice or cooler pack over your knee, 30 minutes on and 30 minutes off.  Continue this for the first 72 hours after surgery, then save ice for use after therapy sessions or on more active days.    Weight:  You may bear weight on your arm as your symptoms allow.  Motion:  Perform gentle shoulder motion as tolerated  Dressing:  Perform 1st dressing change at 2 days postoperative. A moderate amount of blood tinged drainage is to be expected.  So if you bleed through the dressing on the first or second day or if you have fevers, it is fine to change the dressing/check the wounds early and redress wound. Elevate your leg.  If it bleeds through again, or if the incisions are leaking frank blood, please call the office. May change dressing every 1-2 days thereafter to help watch wounds. Can purchase Tegderm (or 58M Nexcare) water resistant dressings at local pharmacy / [facility].  Shower:  Light shower is ok after 2 days.  Please take shower, NO bath. Recover with gauze and ace wrap to help keep wounds protected.    Pain medication:  A narcotic pain medication has been prescribed.  Take as directed.  Typically you need narcotic pain medication more regularly during the first 3 to 5 days after surgery.  Decrease your use of the medication as the pain improves.  Narcotics can sometimes cause constipation, even after a few doses.  If you have problems with constipation, you can take an over the counter stool softener or light laxative.  If you have persistent problems, please notify your physicians office.  Physical therapy: Additional activity guidelines to be provided by your physician or physical therapist at follow-up visits.   Driving: Do not recommend driving x 2 weeks post surgical, especially if surgery performed on right side. Should not drive while taking narcotic pain medications. It typically takes at least 2  weeks to restore sufficient neuromuscular function for normal reaction times for driving safety.   Call [phone] for questions or problems. Evenings you will be forwarded to the hospital operator.  Ask for the orthopaedic physician on call. Please call if you experience:    o Redness, foul smelling, or persistent drainage from the surgical site  o worsening knee pain and swelling not responsive to medication  o any calf pain and or swelling of the lower leg  o temperatures greater than 101.5 F o other questions or concerns   Thank you for allowing [place] to be a part of your care.   Post Anesthesia Home Care Instructions  Activity: Get plenty of rest for the remainder of the day. A responsible adult should stay with you for 24 hours following the procedure.  For the next 24 hours, DO NOT: -Drive a car -Paediatric nurse -Drink alcoholic beverages -Take any medication unless instructed by your physician -Make any legal decisions or sign important papers.  Meals: Start with liquid foods such as gelatin or soup. Progress to regular foods as tolerated. Avoid greasy, spicy, heavy foods. If nausea and/or vomiting occur, drink only clear liquids until the nausea and/or vomiting subsides. Call your physician if vomiting continues.  Special Instructions/Symptoms: Your throat may feel dry or sore from the anesthesia or the breathing tube placed in your throat during surgery. If this causes discomfort, gargle with warm salt water. The discomfort should disappear within 24 hours.  If you had a  scopolamine patch placed behind your ear for the management of post- operative nausea and/or vomiting:  1. The medication in the patch is effective for 72 hours, after which it should be removed.  Wrap patch in a tissue and discard in the trash. Wash hands thoroughly with soap and water. 2. You may remove the patch earlier than 72 hours if you experience unpleasant side effects which may include dry  mouth, dizziness or visual disturbances. 3. Avoid touching the patch. Wash your hands with soap and water after contact with the patch.   Regional Anesthesia Blocks  1. Numbness or the inability to move the "blocked" extremity may last from 3-48 hours after placement. The length of time depends on the medication injected and your individual response to the medication. If the numbness is not going away after 48 hours, call your surgeon.  2. The extremity that is blocked will need to be protected until the numbness is gone and the  Strength has returned. Because you cannot feel it, you will need to take extra care to avoid injury. Because it may be weak, you may have difficulty moving it or using it. You may not know what position it is in without looking at it while the block is in effect.  3. For blocks in the legs and feet, returning to weight bearing and walking needs to be done carefully. You will need to wait until the numbness is entirely gone and the strength has returned. You should be able to move your leg and foot normally before you try and bear weight or walk. You will need someone to be with you when you first try to ensure you do not fall and possibly risk injury.  4. Bruising and tenderness at the needle site are common side effects and will resolve in a few days.  5. Persistent numbness or new problems with movement should be communicated to the surgeon or the [place] [phone] [place] [phone]).

## 2017-01-12 NOTE — Anesthesia Procedure Notes (Signed)
Anesthesia Regional Block:  Interscalene brachial plexus block  Pre-Anesthetic Checklist: ,, timeout performed, Correct Patient, Correct Site, Correct Laterality, Correct Procedure, Correct Position, site marked, Risks and benefits discussed,  Surgical consent,  Pre-op evaluation,  At surgeon's request and post-op pain management  Laterality: Right  Prep: chloraprep       Needles:  Injection technique: Single-shot  Needle Type: Echogenic Needle     Needle Length: 9cm 9 cm Needle Gauge: 21 and 21 G    Additional Needles:  Procedures: ultrasound guided (picture in chart) Interscalene brachial plexus block Narrative:  Start time: 01/12/2017 8:10 AM End time: 01/12/2017 8:16 AM Injection made incrementally with aspirations every 5 mL.  Performed by: Personally  Anesthesiologist: Suella Broad D  Additional Notes: Tolerated well.

## 2017-01-12 NOTE — Anesthesia Procedure Notes (Signed)
Procedure Name: Intubation Date/Time: 01/12/2017 8:40 AM Performed by: Carmello Cabiness D Pre-anesthesia Checklist: Patient identified, Emergency Drugs available, Suction available and Patient being monitored Patient Re-evaluated:Patient Re-evaluated prior to inductionOxygen Delivery Method: Circle system utilized Preoxygenation: Pre-oxygenation with 100% oxygen Intubation Type: IV induction Ventilation: Mask ventilation without difficulty Laryngoscope Size: Mac and 4 Grade View: Grade II Tube type: Oral Number of attempts: 1 Airway Equipment and Method: Stylet and Oral airway Placement Confirmation: ETT inserted through vocal cords under direct vision,  positive ETCO2 and breath sounds checked- equal and bilateral Secured at: 21 cm Tube secured with: Tape Dental Injury: Teeth and Oropharynx as per pre-operative assessment

## 2017-01-12 NOTE — Op Note (Signed)
   Date of Surgery: 04/07/2016  INDICATIONS: The patient is a 51 y.o.-year-old female with right shoulder pain that has failed conservative treatment;  The patient did consent to the procedure after discussion of the risks and benefits.  PREOPERATIVE DIAGNOSIS:  1. Right shoulder impingement syndrome 2. Right rotator cuff tendinosis 3. Degenerative labrum and superior labral tears 4. Synovitis 5. Type II acromion  POSTOPERATIVE DIAGNOSIS: Same.  PROCEDURE:  1. Right shoulder arthroscopy distal clavicle excision 2. Right shoulder arthroscopic acromioplasty and subacromial decompression with release of carpal coracoacromial ligament 3. Right shoulder arthroscopic extensive debridement of anterior labrum, superior labrum, synovitis, glenoid articular surface, articular and bursal surface of supraspinatus tendon, subacromial bursa  SURGEON: N. Eduard Roux, M.D.  ASSIST: Loni Muse, PA student.  ANESTHESIA:  general, regional  IV FLUIDS AND URINE: See anesthesia.  ESTIMATED BLOOD LOSS: minimal mL.  IMPLANTS: None  COMPLICATIONS: None.  DESCRIPTION OF PROCEDURE: The patient was brought to the operating room and placed supine on the operating table.  The patient had been signed prior to the procedure and this was documented. The patient had the anesthesia placed by the anesthesiologist.  A time-out was performed to confirm that this was the correct patient, site, side and location. The patient did receive antibiotics prior to the incision and was re-dosed during the procedure as needed at indicated intervals.  The patient was then moved into the lateral position with the operative extremity suspended in the fishing pole mechanism. The patient had the operative extremity prepped and draped in the standard surgical fashion.    The standard anterior and posterior shoulder arthroscopy portals were created. A diagnostic shoulder arthroscopy was performed. We found extensive synovitis within the  shoulder joint which was debrided using oscillating shaver. Once we had adequate visualization we then found degenerative labral tears of the anterior labrum up to the superior labrum from approximately 5:00 to about 11:00. This was debrided back to stable border using oscillating shaver. The biceps tendon was healthy. There were some small fissures of the articular cartilage of the glenoid and humeral surface. Abrasion arthroplasty was performed. We then evaluated the articular surface of the rotator cuff which showed generalized tendinosis. This was also debrided using oscillating shaver. We then repositioned our portals into the subacromial space. The subacromial bursa was excised fully. I then identified the anterolateral corner of the acromion. An acromioplasty was then performed with a CA ligament release with a cautery wand. After this was performed then identified the acromioclavicular joint and the distal clavicle. There was a large downward sloping osteophyte from the distal clavicle which was removed using a high-speed bur. I then performed a distal clavicle excision of approximate 5 mm also with the high-speed bur. I then evaluated the supraspinatus and infraspinatus tendons on the bursal surface. There is no frank tears. Degenerative tissue was debrided using the oscillating shaver. Final arthroscopy pictures were taken. The incisions were closed with interrupted 4.0 nylon. Sterile dressings were applied. Arm was placed in a shoulder sling. Patient tolerated procedure well had immediate complications  POSTOPERATIVE PLAN: Patient will be weight-bear as tolerated to the right upper extremity. She will follow up in the office in one week for suture removal and initiation of physical therapy.  Azucena Cecil, MD [place] 10:16 AM

## 2017-01-13 ENCOUNTER — Encounter (HOSPITAL_BASED_OUTPATIENT_CLINIC_OR_DEPARTMENT_OTHER): Payer: Self-pay | Admitting: Orthopaedic Surgery

## 2017-01-20 ENCOUNTER — Inpatient Hospital Stay (INDEPENDENT_AMBULATORY_CARE_PROVIDER_SITE_OTHER): Payer: BLUE CROSS/BLUE SHIELD | Admitting: Orthopaedic Surgery

## 2017-01-25 ENCOUNTER — Ambulatory Visit (INDEPENDENT_AMBULATORY_CARE_PROVIDER_SITE_OTHER): Payer: BLUE CROSS/BLUE SHIELD | Admitting: Orthopaedic Surgery

## 2017-01-25 ENCOUNTER — Encounter (INDEPENDENT_AMBULATORY_CARE_PROVIDER_SITE_OTHER): Payer: Self-pay | Admitting: Orthopaedic Surgery

## 2017-01-25 DIAGNOSIS — M75111 Incomplete rotator cuff tear or rupture of right shoulder, not specified as traumatic: Secondary | ICD-10-CM

## 2017-01-25 MED ORDER — HYDROCODONE-ACETAMINOPHEN 7.5-325 MG PO TABS: 1.0000 | ORAL_TABLET | Freq: Four times a day (QID) | ORAL | 0 refills | Status: DC | PRN

## 2017-01-25 NOTE — Progress Notes (Signed)
Patient comes in today for 2 week postop visit shoulder arthroscopy with extensive debridement, subacromial decompression, distal clavicle excision. She is taking hydrocodone and Robaxin for pain. The incisions have healed nicely. Range of motion is as expected. No signs of infection. Referral to physical therapy was given. Norco refill today. Continue out of work. Follow-up with me in 6 weeks for reevaluation.

## 2017-02-02 ENCOUNTER — Telehealth (INDEPENDENT_AMBULATORY_CARE_PROVIDER_SITE_OTHER): Payer: Self-pay

## 2017-02-02 ENCOUNTER — Telehealth (INDEPENDENT_AMBULATORY_CARE_PROVIDER_SITE_OTHER): Payer: Self-pay | Admitting: Orthopaedic Surgery

## 2017-02-02 NOTE — Telephone Encounter (Signed)
Called Patient  and would like to know how long she needs to wear the sling for? Please advise. Thank you.

## 2017-02-02 NOTE — Telephone Encounter (Signed)
For 6 weeks

## 2017-02-02 NOTE — Telephone Encounter (Signed)
Patient called wanting to ask you a few questions about her Pt. CB # [phone]

## 2017-02-03 NOTE — Telephone Encounter (Signed)
Called pt to advise

## 2017-02-17 ENCOUNTER — Telehealth (INDEPENDENT_AMBULATORY_CARE_PROVIDER_SITE_OTHER): Payer: Self-pay | Admitting: *Deleted

## 2017-02-17 NOTE — Telephone Encounter (Signed)
Please advise on note?  

## 2017-02-17 NOTE — Telephone Encounter (Signed)
Just write her out until she follows up with me

## 2017-02-17 NOTE — Telephone Encounter (Signed)
Patient called in this afternoon in regards to her going back to work status and date. She does not come back to see Dr. Erlinda Hong until April 10th? She is confused as to why she wasn't written out of work until her follow up appointment? She would like to have a call back about this please. Pt's CB # [phone]. Thank you

## 2017-02-18 NOTE — Telephone Encounter (Signed)
Printed note. Ready for pick up at the front desk. Called pt pt aware

## 2017-03-08 ENCOUNTER — Encounter (INDEPENDENT_AMBULATORY_CARE_PROVIDER_SITE_OTHER): Payer: Self-pay | Admitting: Orthopaedic Surgery

## 2017-03-08 ENCOUNTER — Other Ambulatory Visit (INDEPENDENT_AMBULATORY_CARE_PROVIDER_SITE_OTHER): Payer: Self-pay

## 2017-03-08 ENCOUNTER — Telehealth (INDEPENDENT_AMBULATORY_CARE_PROVIDER_SITE_OTHER): Payer: Self-pay

## 2017-03-08 ENCOUNTER — Ambulatory Visit (INDEPENDENT_AMBULATORY_CARE_PROVIDER_SITE_OTHER): Payer: BLUE CROSS/BLUE SHIELD | Admitting: Orthopaedic Surgery

## 2017-03-08 DIAGNOSIS — M75111 Incomplete rotator cuff tear or rupture of right shoulder, not specified as traumatic: Secondary | ICD-10-CM

## 2017-03-08 DIAGNOSIS — M7541 Impingement syndrome of right shoulder: Secondary | ICD-10-CM

## 2017-03-08 MED ORDER — DULOXETINE HCL 20 MG PO CPEP: 20.0000 mg | ORAL_CAPSULE | Freq: Every day | ORAL | 3 refills | Status: DC

## 2017-03-08 NOTE — Telephone Encounter (Signed)
Faxed office visit note for today and work note to [employer] per patient. Pt aware.   FAX NUMBERS  1. [place] [phone]

## 2017-03-08 NOTE — Progress Notes (Signed)
The patient is 8 weeks status post right shoulder arthroscopy with debridement and acromioplasty. She continues to have significant pain with any movement. She endorses burning pain anteriorly with the arthroscopy portal. On exam her range of motion and movement is very limited secondary to guarding and catching pain. Pain seems to be dystrophic in nature.  She has a lot of trouble trying to lift her arm. At this point I don't think she is appropriate to return to work. She continues to have significant disability from her condition and from recovery of the surgery. I think she needs to stay out of work for at least another 6 weeks in addition to the time that she started been out which I feel that she was not able to work during this time. We'll put her on Cymbalta to help modulate the pain. I'll like to see her back in 2 weeks to see how this medicine helps her. Questions encouraged and answered.

## 2017-03-09 ENCOUNTER — Telehealth (INDEPENDENT_AMBULATORY_CARE_PROVIDER_SITE_OTHER): Payer: Self-pay | Admitting: Orthopaedic Surgery

## 2017-03-09 NOTE — Telephone Encounter (Signed)
Patient calling because there is a lack of detail on disability form that was sent back to [facility] on April 9th.  Patient is asking if another form must be filled out or will additional information be supplied by Dr. Erlinda Hong.

## 2017-03-10 NOTE — Telephone Encounter (Signed)
Called the patient. She said Meagan Flores needs the last letter from 03/08/17 to state why she is out of work for 6 more weeks, it wasn't detailed enough. She doesn't need a form. Claim # needs to be on it: [id number].

## 2017-03-10 NOTE — Telephone Encounter (Signed)
Patient has had continued significant pain during the postoperative period and has been slowly progressing with PT.  At this time, she is not appropriate to return to work.

## 2017-03-10 NOTE — Telephone Encounter (Signed)
Needs to be more detailed and notes are not detailed enough. Please advise.

## 2017-03-10 NOTE — Telephone Encounter (Signed)
Faxed letter to both [place] numbers

## 2017-03-10 NOTE — Telephone Encounter (Signed)
See message below. Is there any way you can fill out a new one

## 2017-03-22 ENCOUNTER — Encounter (INDEPENDENT_AMBULATORY_CARE_PROVIDER_SITE_OTHER): Payer: Self-pay | Admitting: Orthopaedic Surgery

## 2017-03-22 ENCOUNTER — Ambulatory Visit (INDEPENDENT_AMBULATORY_CARE_PROVIDER_SITE_OTHER): Payer: BLUE CROSS/BLUE SHIELD | Admitting: Orthopaedic Surgery

## 2017-03-22 DIAGNOSIS — M75111 Incomplete rotator cuff tear or rupture of right shoulder, not specified as traumatic: Secondary | ICD-10-CM | POA: Diagnosis not present

## 2017-03-22 DIAGNOSIS — M7541 Impingement syndrome of right shoulder: Secondary | ICD-10-CM | POA: Diagnosis not present

## 2017-03-22 MED ORDER — GABAPENTIN 100 MG PO CAPS: 100.0000 mg | ORAL_CAPSULE | Freq: Three times a day (TID) | ORAL | 0 refills | Status: DC

## 2017-03-22 NOTE — Progress Notes (Signed)
   Procedure Note  Patient: Meagan Flores             Date of Birth: November 14, 1966           MRN: [id number]             Visit Date: 03/22/2017  Procedures: Visit Diagnoses: Impingement syndrome of right shoulder  Nontraumatic incomplete tear of right rotator cuff  Large Joint Inj Date/Time: 03/22/2017 8:14 AM Performed by: Leandrew Koyanagi Authorized by: Leandrew Koyanagi   Consent Given by:  Patient Timeout: prior to procedure the correct patient, procedure, and site was verified   Indications:  Pain Location:  Shoulder Site:  R subacromial bursa Prep: patient was prepped and draped in usual sterile fashion   Needle Size:  22 G Approach:  Posterior Ultrasound Guidance: No   Fluoroscopic Guidance: No

## 2017-03-22 NOTE — Progress Notes (Signed)
Meagan Flores comes back today for follow-up of right shoulder pain. She says his Cymbalta does help her sleep. She continues to have significant catching pain. She is complaining of constant severe pain. On exam pain seems slightly out of proportion. She has significant impingement signs. Rotator cuff is grossly intact with pain. She seems to not be giving full effort with the exam even with the contralateral healthy side. I did perform subacromial injection today. Given her continued pain and issues and would have her get an MRI just to make sure that her rotator cuff has a torn in the interim. I'll see her back in a couple weeks to review the MRI and see how she responds to this injection.

## 2017-03-22 NOTE — Addendum Note (Signed)
Addended by: Precious Bard on: 03/22/2017 08:32 AM   Modules accepted: Orders

## 2017-04-05 ENCOUNTER — Ambulatory Visit (INDEPENDENT_AMBULATORY_CARE_PROVIDER_SITE_OTHER): Payer: BLUE CROSS/BLUE SHIELD | Admitting: Orthopaedic Surgery

## 2017-04-13 ENCOUNTER — Ambulatory Visit
Admission: RE | Admit: 2017-04-13 | Discharge: 2017-04-13 | Disposition: A | Discharge status: Home / Self Care | Payer: BLUE CROSS/BLUE SHIELD | Source: Ambulatory Visit | Attending: Orthopaedic Surgery | Admitting: Orthopaedic Surgery

## 2017-04-13 DIAGNOSIS — M75111 Incomplete rotator cuff tear or rupture of right shoulder, not specified as traumatic: Secondary | ICD-10-CM

## 2017-04-13 DIAGNOSIS — M7541 Impingement syndrome of right shoulder: Secondary | ICD-10-CM

## 2017-04-14 ENCOUNTER — Ambulatory Visit (INDEPENDENT_AMBULATORY_CARE_PROVIDER_SITE_OTHER): Payer: BLUE CROSS/BLUE SHIELD | Admitting: Orthopaedic Surgery

## 2017-04-14 DIAGNOSIS — M75111 Incomplete rotator cuff tear or rupture of right shoulder, not specified as traumatic: Secondary | ICD-10-CM

## 2017-04-14 DIAGNOSIS — M7541 Impingement syndrome of right shoulder: Secondary | ICD-10-CM

## 2017-04-14 MED ORDER — TRAMADOL HCL 50 MG PO TABS: 50.0000 mg | ORAL_TABLET | Freq: Four times a day (QID) | ORAL | 0 refills | Status: DC | PRN

## 2017-04-14 NOTE — Progress Notes (Addendum)
Office Visit Note   Patient: Meagan Flores           Date of Birth: March 06, 1966           MRN: [id number] Visit Date: 04/14/2017              Requested by: No referring provider defined for this encounter. PCP: Patient, No Pcp Per   Assessment & Plan: Visit Diagnoses:  1. Impingement syndrome of right shoulder   2. Nontraumatic incomplete tear of right rotator cuff     Plan: MRI findings consistent with increased subacromial bursitis. Rotator cuff pathology is actually improved. Subacromial injection was performed today. Tramadol was refilled. Continue with home exercises. I feel that she can go back to work on May 28 without restrictions. Follow-up with me as needed.  Follow-Up Instructions: Return if symptoms worsen or fail to improve.   Orders:  No orders of the defined types were placed in this encounter.  Meds ordered this encounter  Medications  . traMADol (ULTRAM) 50 MG tablet    Sig: Take 1 tablet (50 mg total) by mouth every 6 (six) hours as needed.    Dispense:  30 tablet    Refill:  0      Procedures: Large Joint Inj Date/Time: 04/14/2017 9:05 AM Performed by: Leandrew Koyanagi Authorized by: Leandrew Koyanagi   Consent Given by:  Patient Timeout: prior to procedure the correct patient, procedure, and site was verified   Indications:  Pain Location:  Shoulder Site:  R subacromial bursa Prep: patient was prepped and draped in usual sterile fashion   Needle Size:  22 G Approach:  Posterior Ultrasound Guidance: No   Fluoroscopic Guidance: No        Clinical Data: No additional findings.   Subjective: Chief Complaint  Patient presents with  . Right Shoulder - Follow-up, Pain    Meagan Flores comes back to review her MRI. She is slightly feeling better.    Review of Systems  Constitutional: Negative.   HENT: Negative.   Eyes: Negative.   Respiratory: Negative.   Cardiovascular: Negative.   Endocrine: Negative.   Musculoskeletal: Negative.     Neurological: Negative.   Hematological: Negative.   Psychiatric/Behavioral: Negative.   All other systems reviewed and are negative.    Objective: Vital Signs: There were no vitals taken for this visit.  Physical Exam  Ortho Exam Right shoulder exam is mildly positive impingement signs. Rotator cuff is grossly intact. Specialty Comments:  No specialty comments available.  Imaging: No results found.   PMFS History: Patient Active Problem List   Diagnosis Date Noted  . Tear of right rotator cuff   . Nontraumatic incomplete tear of right rotator cuff 12/24/2016  . Impingement syndrome of right shoulder 12/24/2016  . Right shoulder pain 01/24/2015  . Glenohumeral arthritis 12/27/2014   Past Medical History:  Diagnosis Date  . Hypothyroidism   . Thyroid disease     No family history on file.  Past Surgical History:  Procedure Laterality Date  . PARTIAL HYSTERECTOMY    . right arm sx    . SHOULDER ARTHROSCOPY WITH DISTAL CLAVICLE RESECTION Right 01/12/2017   Procedure: RIGHT SHOULDER ARTHROSCOPY WITH  SUBACROMIAL DECOMPRESSION, DISTAL CLAVICLE EXCISION, extensive debridement;  Surgeon: Leandrew Koyanagi, MD;  Location: [facility];  Service: Orthopedics;  Laterality: Right;  . THYROIDECTOMY     Social History   Occupational History  . Not on file.   Social History Main  Topics  . Smoking status: Current Every Day Smoker    Packs/day: 0.50    Years: 34.00  . Smokeless tobacco: Never Used  . Alcohol use Yes     Comment: occassional  . Drug use: No  . Sexual activity: Yes    Birth control/ protection: Surgical

## 2017-04-29 NOTE — Addendum Note (Signed)
Addendum  created 04/29/17 1013 by Effie Berkshire, MD   Sign clinical note

## 2017-07-04 ENCOUNTER — Ambulatory Visit: Payer: BLUE CROSS/BLUE SHIELD | Attending: Family Medicine | Admitting: Family Medicine

## 2017-07-04 ENCOUNTER — Other Ambulatory Visit: Payer: Self-pay | Admitting: Family Medicine

## 2017-07-04 ENCOUNTER — Ambulatory Visit: Payer: BLUE CROSS/BLUE SHIELD | Attending: Family Medicine | Admitting: Licensed Clinical Social Worker

## 2017-07-04 ENCOUNTER — Encounter: Payer: Self-pay | Admitting: Family Medicine

## 2017-07-04 VITALS — BP 122/82 | HR 66 | Temp 98.6°F | Resp 18 | Ht 68.0 in | Wt 202.4 lb

## 2017-07-04 DIAGNOSIS — Z79891 Long term (current) use of opiate analgesic: Secondary | ICD-10-CM | POA: Insufficient documentation

## 2017-07-04 DIAGNOSIS — H547 Unspecified visual loss: Secondary | ICD-10-CM | POA: Diagnosis not present

## 2017-07-04 DIAGNOSIS — F4321 Adjustment disorder with depressed mood: Secondary | ICD-10-CM

## 2017-07-04 DIAGNOSIS — Z79899 Other long term (current) drug therapy: Secondary | ICD-10-CM | POA: Insufficient documentation

## 2017-07-04 DIAGNOSIS — L2082 Flexural eczema: Secondary | ICD-10-CM | POA: Diagnosis not present

## 2017-07-04 DIAGNOSIS — Z1239 Encounter for other screening for malignant neoplasm of breast: Secondary | ICD-10-CM

## 2017-07-04 DIAGNOSIS — E079 Disorder of thyroid, unspecified: Secondary | ICD-10-CM

## 2017-07-04 DIAGNOSIS — H539 Unspecified visual disturbance: Secondary | ICD-10-CM | POA: Diagnosis present

## 2017-07-04 DIAGNOSIS — Z1231 Encounter for screening mammogram for malignant neoplasm of breast: Secondary | ICD-10-CM

## 2017-07-04 DIAGNOSIS — Z1211 Encounter for screening for malignant neoplasm of colon: Secondary | ICD-10-CM

## 2017-07-04 DIAGNOSIS — H0011 Chalazion right upper eyelid: Secondary | ICD-10-CM | POA: Diagnosis not present

## 2017-07-04 MED ORDER — EUCERIN EX CREA: TOPICAL_CREAM | CUTANEOUS | 0 refills | Status: DC | PRN

## 2017-07-04 MED ORDER — TRIAMCINOLONE ACETONIDE 0.1 % EX CREA: 1.0000 "application " | TOPICAL_CREAM | Freq: Two times a day (BID) | CUTANEOUS | 0 refills | Status: DC

## 2017-07-04 NOTE — Progress Notes (Signed)
Patient is here for eye issues

## 2017-07-04 NOTE — BH Specialist Note (Signed)
Integrated Behavioral Health Initial Visit  MRN: [id number] Name: Meagan Flores   Session Start time: 3:10 PM Session End time: 3:40 PM Total time: 30 minutes  Type of Service: [place] Interpretor:No. Interpretor Name and Language: N/A   Warm Hand Off Completed.       SUBJECTIVE: Meagan Flores is a 51 y.o. female accompanied by patient. Patient was referred by FNP for depression and anxiety. Patient reports the following symptoms/concerns: overwhelming feelings of sadness and worry, difficulty sleeping, low energy, and poor appetite Duration of problem: 3 months; Severity of problem: moderate  OBJECTIVE: Mood: Dysphoric and Affect: Appropriate Risk of harm to self or others: No plan to harm self or others   LIFE CONTEXT: Family and Social: Pt receives emotional support from her spouse and adult children. She has eight grandchildren who she is very close to School/Work: Pt is employed full time Self-Care: Pt smokes cigarettes. She states that she has decreased usage from a pack a day to 4-6 cigarettes daily Life Changes: Pt is grieving the death of her granddaughter's mother. Pt's son is currently incarcerated resulting in pt's granddaughters being placed in [place] custody  GOALS ADDRESSED: Patient will reduce symptoms of: anxiety and depression and increase knowledge and/or ability of: coping skills and also: Increase adequate support systems for patient/family and Begin healthy grieving over loss   INTERVENTIONS: Solution-Focused Strategies, Supportive Counseling, Psychoeducation and/or Health Education and Link to [employer]  Standardized Assessments completed: GAD-7 and PHQ 2&9  ASSESSMENT: Patient currently experiencing depression and anxiety triggered by grief. Patient's granddaughters are in [place] custody as a result of their mother's death and pt's son being incarcerated. Patient may benefit from psychotherapy,  psychoeducation, and medication management. She receives emotional support from family. [place] educated pt on the stages of grief and encouraged her to participate in grief counseling and support. Pt was provided grief resources. Pt is not receptive to initiating behavioral health services or medication management at this time. LCSWA provided pt with crisis resources.  PLAN: 1. Follow up with behavioral health clinician on : Pt was encouraged to contact LCSWA if symptoms worsen or fail to improve to schedule behavioral appointments at [facility]. 2. Behavioral recommendations: LCSWA recommends that pt apply healthy coping skills discussed and utilize provided resources. Pt is encouraged to schedule follow up appointment with LCSWA 3. Referral(s): Grief Support 4. "From scale of 1-10, how likely are you to follow plan?": 6/10  Rebekah [place], LCSW 07/05/17 11:14 AM

## 2017-07-04 NOTE — Progress Notes (Signed)
Subjective:  Patient ID: Meagan Flores, female    DOB: 1966/09/20  Age: 51 y.o. MRN: [id number]  CC: Eye Problem   HPI KATENA PETITJEAN presents for eye problem. Symptoms began two week ago. She reports raised areas to right upper eyelid. Denies any itching, drainage, or redness. She does reports decreased vision. History of wearing OTC prescription reader glasses. She  complains of rash to bilateral hands. Associated symptoms: pruritus and hyperpigmentation.  Onset of symptoms a year ago, and have been gradually worsening since that time. Treatment modalities that have been used in the past include: OTC cortisone cream. Previous evaluation: dermatology in the past, prescription cream.She complains of depression. She complains of depressed mood. Onset was approximately 3 months ago, gradually worsening since that time.  She denies current suicidal and homicidal plan or intent. Possible organic causes contributing are: none.  Risk factors: negative life event son in currently incarcerated, grandchild is currently in foster care due to mother dying in May 2018. Previous treatment includes none.  She is agreeable to speaking with the LCSW at this time. She declines medication at this time. History of hypothyroidism. Symptoms consist of denies fatigue, weight changes, heat/cold intolerance, bowel/skin changes or CVS symptoms.      Outpatient Medications Prior to Visit  Medication Sig Dispense Refill  . HYDROcodone-acetaminophen (NORCO) 7.5-325 MG tablet Take 1 tablet by mouth every 6 (six) hours as needed for moderate pain. 40 tablet 0  . levothyroxine (SYNTHROID, LEVOTHROID) 125 MCG tablet Take 1 tablet (125 mcg total) by mouth daily before breakfast. 30 tablet 2  . DULoxetine (CYMBALTA) 20 MG capsule Take 1 capsule (20 mg total) by mouth daily. 30 capsule 3  . gabapentin (NEURONTIN) 100 MG capsule Take 1 capsule (100 mg total) by mouth 3 (three) times daily. 60 capsule 0  . methocarbamol (ROBAXIN) 500  MG tablet Take 1 tablet (500 mg total) by mouth every 6 (six) hours as needed for muscle spasms. 30 tablet 2  . naproxen (NAPROSYN) 500 MG tablet Take 1 tablet (500 mg total) by mouth 2 (two) times daily as needed for mild pain or moderate pain. 30 tablet 0  . ondansetron (ZOFRAN) 4 MG tablet Take 1-2 tablets (4-8 mg total) by mouth every 8 (eight) hours as needed for nausea or vomiting. 40 tablet 0  . promethazine (PHENERGAN) 25 MG tablet Take 1 tablet (25 mg total) by mouth every 6 (six) hours as needed for nausea. 30 tablet 1  . traMADol (ULTRAM) 50 MG tablet Take 1 tablet (50 mg total) by mouth every 6 (six) hours as needed. 30 tablet 0   No facility-administered medications prior to visit.     ROS Review of Systems  Constitutional: Negative.   HENT: Negative.   Eyes: Positive for visual disturbance.       Eyelid lesion  Respiratory: Negative.   Cardiovascular: Negative.   Skin: Positive for rash.  Psychiatric/Behavioral: Positive for dysphoric mood.        Objective:  BP 122/82 (BP Location: Left Arm, Patient Position: Sitting, Cuff Size: Normal)   Pulse 66   Temp 98.6 F (37 C) (Oral)   Resp 18   Ht 5\' 8"  (1.727 m)   Wt 202 lb 6.4 oz (91.8 kg)   SpO2 99%   BMI 30.77 kg/m   BP/Weight 07/04/2017 01/12/2017 43/32/9518  Systolic BP 841 660 -  Diastolic BP 82 87 -  Wt. (Lbs) 202.4 197 190  BMI 30.77 29.95 28.89  Physical Exam  Constitutional: She appears well-developed and well-nourished.  HENT:  Head: Normocephalic and atraumatic.  Right Ear: External ear normal.  Left Ear: External ear normal.  Nose: Nose normal.  Mouth/Throat: Oropharynx is clear and moist.  Eyes: Pupils are equal, round, and reactive to light. Conjunctivae are normal.    Neck: Normal range of motion. Neck supple. No thyromegaly present.  Cardiovascular: Normal rate, regular rhythm, normal heart sounds and intact distal pulses.   Pulmonary/Chest: Effort normal and breath sounds normal.    Skin: Skin is warm and dry. Rash (bilateral hands and forearms; hyperpigmented ) noted.  Nursing note and vitals reviewed.   Assessment & Plan:   Problem List Items Addressed This Visit      Endocrine   Thyroid disease   Relevant Orders   TSH (Completed)    Other Visit Diagnoses    Chalazion of right upper eyelid    -  Primary   Relevant Orders   Ambulatory referral to Ophthalmology   Flexural eczema       Relevant Medications   triamcinolone cream (KENALOG) 0.1 %   Skin Protectants, Misc. (EUCERIN) cream   Adjustment disorder with depressed mood       LCSW spoke with patient and provided resources.    Decreased visual acuity       Visual acuity screen   Relevant Orders   Ambulatory referral to Ophthalmology   Screening for colon cancer       Relevant Orders   Ambulatory referral to Gastroenterology   Screening for breast cancer      Relevant Orders  MM SCREENING BREAST TOMO BILATERAL        Meds ordered this encounter  Medications  . triamcinolone cream (KENALOG) 0.1 %    Sig: Apply 1 application topically 2 (two) times daily.    Dispense:  453.6 g    Refill:  0    Order Specific Question:   Supervising Provider    Answer:   Tresa Garter [id number]  . Skin Protectants, Misc. (EUCERIN) cream    Sig: Apply topically as needed for dry skin.    Dispense:  454 g    Refill:  0    Order Specific Question:   Supervising Provider    Answer:   Tresa Garter [[id number]]    Follow-up: Return in about 1 month (around 08/04/2017) for Physical.   Alfonse Spruce FNP

## 2017-07-04 NOTE — Patient Instructions (Addendum)
Chalazion A chalazion is a swelling or lump on the eyelid. It can affect the upper or lower eyelid. What are the causes? This condition may be caused by:  Long-lasting (chronic) inflammation of the eyelid glands.  A blocked oil gland in the eyelid.  What are the signs or symptoms? Symptoms of this condition include:  A swelling on the eyelid. The swelling may spread to areas around the eye.  A hard lump on the eyelid. This lump may make it hard to see out of the eye.  How is this diagnosed? This condition is diagnosed with an examination of the eye. How is this treated? This condition is treated by applying a warm compress to the eyelid. If the condition does not improve after two days, it may be treated with:  Surgery.  Medicine that is injected into the chalazion by a health care provider.  Medicine that is applied to the eye.  Follow these instructions at home:  Do not touch the chalazion.  Do not try to remove the pus, such as by squeezing the chalazion or sticking it with a pin or needle.  Do not rub your eyes.  Wash your hands often. Dry your hands with a clean towel.  Keep your face, scalp, and eyebrows clean.  Avoid wearing eye makeup.  Apply a warm, moist compress to the eyelid 4-6 times a day for 10-15 minutes at a time. This will help to open any blocked glands and help to reduce redness and swelling.  Apply over-the-counter and prescription medicines only as told by your health care provider.  If the chalazion does not break open (rupture) on its own in a month, return to your health care provider.  Keep all follow-up appointments as told by your health care provider. This is important. Contact a health care provider if:  Your eyelid has not improved in 4 weeks.  Your eyelid is getting worse.  You have a fever.  The chalazion does not rupture on its own with home treatment in a month. Get help right away if:  You have pain in your eye.  Your  vision changes.  The chalazion becomes painful or red  The chalazion gets bigger. This information is not intended to replace advice given to you by your health care provider. Make sure you discuss any questions you have with your health care provider. Document Released: 11/12/2000 Document Revised: 04/22/2016 Document Reviewed: 03/10/2015 Elsevier Interactive Patient Education  2018 [employer]. Eczema Eczema, also called atopic dermatitis, is a skin disorder that causes inflammation of the skin. It causes a red rash and dry, scaly skin. The skin becomes very itchy. Eczema is generally worse during the cooler winter months and often improves with the warmth of summer. Eczema usually starts showing signs in infancy. Some children outgrow eczema, but it may last through adulthood. What are the causes? The exact cause of eczema is not known, but it appears to run in families. People with eczema often have a family history of eczema, allergies, asthma, or hay fever. Eczema is not contagious. Flare-ups of the condition may be caused by:  Contact with something you are sensitive or allergic to.  Stress.  What are the signs or symptoms?  Dry, scaly skin.  Red, itchy rash.  Itchiness. This may occur before the skin rash and may be very intense. How is this diagnosed? The diagnosis of eczema is usually made based on symptoms and medical history. How is this treated? Eczema cannot be cured,  but symptoms usually can be controlled with treatment and other strategies. A treatment plan might include:  Controlling the itching and scratching. ? Use over-the-counter antihistamines as directed for itching. This is especially useful at night when the itching tends to be worse. ? Use over-the-counter steroid creams as directed for itching. ? Avoid scratching. Scratching makes the rash and itching worse. It may also result in a skin infection (impetigo) due to a break in the skin caused by  scratching.  Keeping the skin well moisturized with creams every day. This will seal in moisture and help prevent dryness. Lotions that contain alcohol and water should be avoided because they can dry the skin.  Limiting exposure to things that you are sensitive or allergic to (allergens).  Recognizing situations that cause stress.  Developing a plan to manage stress.  Follow these instructions at home:  Only take over-the-counter or prescription medicines as directed by your health care provider.  Do not use anything on the skin without checking with your health care provider.  Keep baths or showers short (5 minutes) in warm (not hot) water. Use mild cleansers for bathing. These should be unscented. You may add nonperfumed bath oil to the bath water. It is best to avoid soap and bubble bath.  Immediately after a bath or shower, when the skin is still damp, apply a moisturizing ointment to the entire body. This ointment should be a petroleum ointment. This will seal in moisture and help prevent dryness. The thicker the ointment, the better. These should be unscented.  Keep fingernails cut short. Children with eczema may need to wear soft gloves or mittens at night after applying an ointment.  Dress in clothes made of cotton or cotton blends. Dress lightly, because heat increases itching.  A child with eczema should stay away from anyone with fever blisters or cold sores. The virus that causes fever blisters (herpes simplex) can cause a serious skin infection in children with eczema. Contact a health care provider if:  Your itching interferes with sleep.  Your rash gets worse or is not better within 1 week after starting treatment.  You see pus or soft yellow scabs in the rash area.  You have a fever.  You have a rash flare-up after contact with someone who has fever blisters. This information is not intended to replace advice given to you by your health care provider. Make sure  you discuss any questions you have with your health care provider. Document Released: 11/12/2000 Document Revised: 04/22/2016 Document Reviewed: 06/18/2013 Elsevier Interactive Patient Education  2017 [employer].

## 2017-07-05 ENCOUNTER — Encounter: Payer: Self-pay | Admitting: Gastroenterology

## 2017-07-05 LAB — TSH: TSH: 1.6 u[IU]/mL (ref 0.450–4.500)

## 2017-07-13 ENCOUNTER — Ambulatory Visit
Admission: RE | Admit: 2017-07-13 | Discharge: 2017-07-13 | Disposition: A | Discharge status: Home / Self Care | Payer: BLUE CROSS/BLUE SHIELD | Source: Ambulatory Visit | Attending: Family Medicine | Admitting: Family Medicine

## 2017-07-13 DIAGNOSIS — Z1231 Encounter for screening mammogram for malignant neoplasm of breast: Secondary | ICD-10-CM

## 2017-07-15 ENCOUNTER — Other Ambulatory Visit: Payer: Self-pay | Admitting: Family Medicine

## 2017-07-15 ENCOUNTER — Telehealth: Payer: Self-pay

## 2017-07-15 NOTE — Progress Notes (Signed)
Spoke with patient she is still taking her levothyroxine medication

## 2017-07-15 NOTE — Telephone Encounter (Signed)
-----   Message from Alfonse Spruce, [place] sent at 07/15/2017  8:40 AM EDT ----- Thyroid function normal.

## 2017-07-15 NOTE — Telephone Encounter (Signed)
-----   Message from Alfonse Spruce, [place] sent at 07/15/2017  8:41 AM EDT ----- Regarding: Labs Results Meagan Flores,  When notifying patient of thyroid labs. Please ask her if she is still taking her levothyroxine. If not ask her when she stopped taking.Thanks.   Fredia Beets FNP-BC   ----- Message ----- From: Interface, [facility] Lab Results In Sent: 07/05/2017  10:38 AM To: Alfonse Spruce, FNP

## 2017-07-15 NOTE — Telephone Encounter (Signed)
CMA call regarding lab results   Patient Verify DOB   Patient was aware and understood  

## 2017-07-20 ENCOUNTER — Telehealth: Payer: Self-pay

## 2017-07-20 ENCOUNTER — Other Ambulatory Visit: Payer: Self-pay | Admitting: Family Medicine

## 2017-07-20 DIAGNOSIS — E039 Hypothyroidism, unspecified: Secondary | ICD-10-CM

## 2017-07-20 MED ORDER — LEVOTHYROXINE SODIUM 125 MCG PO TABS: 125.0000 ug | ORAL_TABLET | Freq: Every day | ORAL | 2 refills | Status: DC

## 2017-07-20 NOTE — Telephone Encounter (Signed)
MA call regarding letting the patient know that the refill sent to pharmacy   Patient verify DOB   Patient was aware and understood

## 2017-07-20 NOTE — Telephone Encounter (Signed)
-----   Message from Alfonse Spruce, [place] sent at 07/20/2017  1:35 PM EDT ----- Refill sent

## 2017-08-05 ENCOUNTER — Encounter: Payer: Self-pay | Admitting: Family Medicine

## 2017-08-05 ENCOUNTER — Ambulatory Visit: Payer: BLUE CROSS/BLUE SHIELD | Attending: Family Medicine | Admitting: Family Medicine

## 2017-08-05 VITALS — BP 123/80 | HR 77 | Temp 98.6°F | Resp 18 | Ht 68.0 in | Wt 198.0 lb

## 2017-08-05 DIAGNOSIS — Z800 Family history of malignant neoplasm of digestive organs: Secondary | ICD-10-CM | POA: Insufficient documentation

## 2017-08-05 DIAGNOSIS — E039 Hypothyroidism, unspecified: Secondary | ICD-10-CM | POA: Diagnosis not present

## 2017-08-05 DIAGNOSIS — Z8669 Personal history of other diseases of the nervous system and sense organs: Secondary | ICD-10-CM | POA: Diagnosis not present

## 2017-08-05 DIAGNOSIS — Z0000 Encounter for general adult medical examination without abnormal findings: Secondary | ICD-10-CM | POA: Diagnosis present

## 2017-08-05 DIAGNOSIS — H547 Unspecified visual loss: Secondary | ICD-10-CM | POA: Diagnosis not present

## 2017-08-05 DIAGNOSIS — M25512 Pain in left shoulder: Secondary | ICD-10-CM

## 2017-08-05 DIAGNOSIS — Z8249 Family history of ischemic heart disease and other diseases of the circulatory system: Secondary | ICD-10-CM | POA: Insufficient documentation

## 2017-08-05 DIAGNOSIS — Z833 Family history of diabetes mellitus: Secondary | ICD-10-CM | POA: Diagnosis not present

## 2017-08-05 DIAGNOSIS — Z23 Encounter for immunization: Secondary | ICD-10-CM | POA: Diagnosis not present

## 2017-08-05 DIAGNOSIS — E785 Hyperlipidemia, unspecified: Secondary | ICD-10-CM | POA: Diagnosis not present

## 2017-08-05 DIAGNOSIS — F17210 Nicotine dependence, cigarettes, uncomplicated: Secondary | ICD-10-CM | POA: Insufficient documentation

## 2017-08-05 DIAGNOSIS — Z9889 Other specified postprocedural states: Secondary | ICD-10-CM | POA: Diagnosis not present

## 2017-08-05 MED ORDER — CYCLOBENZAPRINE HCL 10 MG PO TABS: 10.0000 mg | ORAL_TABLET | Freq: Three times a day (TID) | ORAL | 0 refills | Status: DC | PRN

## 2017-08-05 MED ORDER — NAPROXEN 500 MG PO TABS: ORAL_TABLET | ORAL | 0 refills | Status: DC

## 2017-08-05 NOTE — Progress Notes (Signed)
Patient is here for physical   Patient has no other concerns

## 2017-08-05 NOTE — Patient Instructions (Addendum)
Follow up with colonoscopy appointment [facility]  [phone]   Exercising to Stay Healthy Exercising regularly is important. It has many health benefits, such as:  Improving your overall fitness, flexibility, and endurance.  Increasing your bone density.  Helping with weight control.  Decreasing your body fat.  Increasing your muscle strength.  Reducing stress and tension.  Improving your overall health.  In order to become healthy and stay healthy, it is recommended that you do moderate-intensity and vigorous-intensity exercise. You can tell that you are exercising at a moderate intensity if you have a higher heart rate and faster breathing, but you are still able to hold a conversation. You can tell that you are exercising at a vigorous intensity if you are breathing much harder and faster and cannot hold a conversation while exercising. How often should I exercise? Choose an activity that you enjoy and set realistic goals. Your health care provider can help you to make an activity plan that works for you. Exercise regularly as directed by your health care provider. This may include:  Doing resistance training twice each week, such as: ? Push-ups. ? Sit-ups. ? Lifting weights. ? Using resistance bands.  Doing a given intensity of exercise for a given amount of time. Choose from these options: ? 150 minutes of moderate-intensity exercise every week. ? 75 minutes of vigorous-intensity exercise every week. ? A mix of moderate-intensity and vigorous-intensity exercise every week.  Children, pregnant women, people who are out of shape, people who are overweight, and older adults may need to consult a health care provider for individual recommendations. If you have any sort of medical condition, be sure to consult your health care provider before starting a new exercise program. What are some exercise ideas? Some moderate-intensity exercise ideas  include:  Walking at a rate of 1 mile in 15 minutes.  Biking.  Hiking.  Golfing.  Dancing.  Some vigorous-intensity exercise ideas include:  Walking at a rate of at least 4.5 miles per hour.  Jogging or running at a rate of 5 miles per hour.  Biking at a rate of at least 10 miles per hour.  Lap swimming.  Roller-skating or in-line skating.  Cross-country skiing.  Vigorous competitive sports, such as football, basketball, and soccer.  Jumping rope.  Aerobic dancing.  What are some everyday activities that can help me to get exercise?  [place] work, such as: ? Pushing a Conservation officer, nature. ? Raking and bagging leaves.  Washing and waxing your car.  Pushing a stroller.  Shoveling snow.  Gardening.  Washing windows or floors. How can I be more active in my day-to-day activities?  Use the stairs instead of the elevator.  Take a walk during your lunch break.  If you drive, park your car farther away from work or school.  If you take public transportation, get off one stop early and walk the rest of the way.  Make all of your phone calls while standing up and walking around.  Get up, stretch, and walk around every 30 minutes throughout the day. What guidelines should I follow while exercising?  Do not exercise so much that you hurt yourself, feel dizzy, or get very short of breath.  Consult your health care provider before starting a new exercise program.  Wear comfortable clothes and shoes with good support.  Drink plenty of water while you exercise to prevent dehydration or heat stroke. Body water is lost during exercise and must be replaced.  Work out until  you breathe faster and your heart beats faster. This information is not intended to replace advice given to you by your health care provider. Make sure you discuss any questions you have with your health care provider. Document Released: 12/18/2010 Document Revised: 04/22/2016 Document Reviewed:  04/18/2014 Elsevier Interactive Patient Education  [employer].

## 2017-08-05 NOTE — Progress Notes (Signed)
Subjective:   Patient ID: Meagan Flores, female    DOB: 25-Oct-1966, 51 y.o.   MRN: [id number]  Chief Complaint  Patient presents with  . Annual Exam   HPI EDYNN Flores 51 y.o. female presents For comprehensive physical exam. She reports family history of hypertension-mother, sister; diabetes-mother; cancer- colon (sister). Past medical history is hypothyroidism. She denies fatigue, weight changes, heat/cold intolerance, bowel/skin changes or CVS symptoms. She denies any CP, SOB, or swelling of the bilateral lower extremities. She complains of  arthralgias for which has been present for over a  year. Pain is located in both shoulder(s), is described as aching, and is moderate in intensity  Associated symptoms include: decreased range of motion.  The patient has tried  NSAIDS, narcotics for symptoms in the past..  Related to injury: no. Denies symptoms of all other pertinent systems.   Past Medical History:  Diagnosis Date  . Hyperlipidemia   . Hypothyroidism   . Thyroid disease     Past Surgical History:  Procedure Laterality Date  . PARTIAL HYSTERECTOMY    . right arm sx    . SHOULDER ARTHROSCOPY WITH DISTAL CLAVICLE RESECTION Right 01/12/2017   Procedure: RIGHT SHOULDER ARTHROSCOPY WITH  SUBACROMIAL DECOMPRESSION, DISTAL CLAVICLE EXCISION, extensive debridement;  Surgeon: Leandrew Koyanagi, MD;  Location: [facility];  Service: Orthopedics;  Laterality: Right;  . THYROIDECTOMY      Family History  Problem Relation Age of Onset  . Colon cancer Sister 54  . Esophageal cancer Neg Hx   . Rectal cancer Neg Hx   . Stomach cancer Neg Hx     Social History   Social History  . Marital status: Married    Spouse name: N/A  . Number of children: N/A  . Years of education: N/A   Occupational History  . Not on file.   Social History Main Topics  . Smoking status: Current Every Day Smoker    Packs/day: 0.25    Years: 34.00  . Smokeless tobacco: Never Used  . Alcohol use  Yes     Comment: occassional  . Drug use: No  . Sexual activity: Yes    Birth control/ protection: Surgical   Other Topics Concern  . Not on file   Social History Narrative  . No narrative on file    Outpatient Medications Prior to Visit  Medication Sig Dispense Refill  . levothyroxine (SYNTHROID, LEVOTHROID) 125 MCG tablet Take 1 tablet (125 mcg total) by mouth daily before breakfast. 30 tablet 2  . triamcinolone cream (KENALOG) 0.1 % Apply 1 application topically 2 (two) times daily. 453.6 g 0  . Skin Protectants, Misc. (EUCERIN) cream Apply topically as needed for dry skin. 454 g 0  . HYDROcodone-acetaminophen (NORCO) 7.5-325 MG tablet Take 1 tablet by mouth every 6 (six) hours as needed for moderate pain. (Patient not taking: Reported on 08/05/2017) 40 tablet 0   No facility-administered medications prior to visit.     Allergies  Allergen Reactions  . Sulfa Antibiotics Hives    Review of Systems  Constitutional: Negative.   HENT: Negative.   Eyes: Negative.   Respiratory: Negative.   Cardiovascular: Negative.   Gastrointestinal: Negative.   Genitourinary: Negative.   Musculoskeletal: Positive for joint pain.  Neurological: Negative.   Psychiatric/Behavioral: Negative for suicidal ideas.       Objective:    Physical Exam  Constitutional: She is oriented to person, place, and time. She appears well-developed and well-nourished.  HENT:  Head: Normocephalic and atraumatic.  Right Ear: External ear normal.  Left Ear: External ear normal.  Nose: Nose normal.  Mouth/Throat: Oropharynx is clear and moist.  Eyes: Pupils are equal, round, and reactive to light. Conjunctivae and EOM are normal.  Neck: Normal range of motion. Neck supple. No JVD present.  Cardiovascular: Normal rate, regular rhythm, normal heart sounds and intact distal pulses.   Pulmonary/Chest: Effort normal and breath sounds normal.  Abdominal: Soft. Bowel sounds are normal. There is no tenderness.    Genitourinary: Vagina normal and uterus normal.  Musculoskeletal:       Right shoulder: She exhibits decreased range of motion (hyperextension) and pain.       Left shoulder: She exhibits decreased range of motion (hyperextension) and pain.  Neurological: She is alert and oriented to person, place, and time. She has normal reflexes.  Skin: Skin is warm and dry.  Psychiatric: She expresses no homicidal and no suicidal ideation. She expresses no suicidal plans and no homicidal plans.  Nursing note and vitals reviewed.   BP 123/80 (BP Location: Left Arm, Patient Position: Sitting, Cuff Size: Normal)   Pulse 77   Temp 98.6 F (37 C) (Oral)   Resp 18   Ht 5\' 8"  (1.727 m)   Wt 198 lb (89.8 kg)   SpO2 97%   BMI 30.11 kg/m  Wt Readings from Last 3 Encounters:  08/19/17 200 lb 3.2 oz (90.8 kg)  08/05/17 198 lb (89.8 kg)  07/04/17 202 lb 6.4 oz (91.8 kg)    Immunization History  Administered Date(s) Administered  . Tdap 08/05/2017      Lab Results  Component Value Date   TSH 1.600 07/04/2017   Lab Results  Component Value Date   WBC 7.0 08/05/2017   HGB 12.8 08/05/2017   HCT 41.4 08/05/2017   MCV 77 (L) 08/05/2017   PLT 229 08/05/2017   Lab Results  Component Value Date   NA 142 08/05/2017   K 4.0 08/05/2017   CO2 21 08/05/2017   GLUCOSE 92 08/05/2017   BUN 13 08/05/2017   CREATININE 0.76 08/05/2017   BILITOT 0.2 08/05/2017   ALKPHOS 82 08/05/2017   AST 17 08/05/2017   ALT 11 08/05/2017   PROT 7.5 08/05/2017   ALBUMIN 4.7 08/05/2017   CALCIUM 8.8 08/05/2017   ANIONGAP 8 07/10/2016   Lab Results  Component Value Date   CHOL 214 (H) 08/05/2017   CHOL 194 01/29/2014   CHOL  03/12/2008    124        ATP III CLASSIFICATION:  <200     mg/dL   Desirable  200-239  mg/dL   Borderline High  >=240    mg/dL   High   Lab Results  Component Value Date   HDL 51 08/05/2017   HDL 51 01/29/2014   HDL 28 (L) 03/12/2008   Lab Results  Component Value Date    LDLCALC 145 (H) 08/05/2017   LDLCALC 125 (H) 01/29/2014   LDLCALC  03/12/2008    80        Total Cholesterol/HDL:CHD Risk Coronary Heart Disease Risk Table                     Men   Women  1/2 Average Risk   3.4   3.3   Lab Results  Component Value Date   TRIG 88 08/05/2017   TRIG 90 01/29/2014   TRIG 82 03/12/2008   Lab Results  Component Value  Date   CHOLHDL 4.2 08/05/2017   CHOLHDL 3.8 01/29/2014   CHOLHDL 4.4 03/12/2008      Assessment & Plan:   Problem List Items Addressed This Visit    None    Visit Diagnoses    Annual physical exam    -  Primary   Relevant Orders   Lipid Panel (Completed)   VITAMIN D 25 Hydroxy (Vit-D Deficiency, Fractures) (Completed)   CMP and Liver (Completed)   CBC with Differential (Completed)   Ambulatory referral to Ophthalmology   History of repair of right rotator cuff       Relevant Medicatoins           cyclobenzaprine (FLEXERIL) 10 MG tablet           naproxen (NAPROSYN) 500 MG tablet     Relevant Orders   Ambulatory referral to Orthopedics   Acute pain of left shoulder       Relevant Medicatoins           cyclobenzaprine (FLEXERIL) 10 MG tablet           naproxen (NAPROSYN) 500 MG tablet     Relevant Orders   Ambulatory referral to Orthopedics   Decreased visual acuity       Relevant Orders   Ambulatory referral to Ophthalmology   History of chalazion       Relevant Orders   Ambulatory referral to Ophthalmology   Healthcare maintenance       Relevant Orders   Tdap vaccine greater than or equal to 7yo IM (Completed)            Fredia Beets, FNP

## 2017-08-06 LAB — CBC WITH DIFFERENTIAL/PLATELET
BASOS ABS: 0 10*3/uL (ref 0.0–0.2)
Basos: 0 %
EOS (ABSOLUTE): 0.1 10*3/uL (ref 0.0–0.4)
EOS: 1 %
Hematocrit: 41.4 % (ref 34.0–46.6)
Hemoglobin: 12.8 g/dL (ref 11.1–15.9)
IMMATURE GRANULOCYTES: 0 %
Immature Grans (Abs): 0 10*3/uL (ref 0.0–0.1)
LYMPHS ABS: 2.9 10*3/uL (ref 0.7–3.1)
Lymphs: 41 %
MCH: 23.8 pg — ABNORMAL LOW (ref 26.6–33.0)
MCHC: 30.9 g/dL — AB (ref 31.5–35.7)
MCV: 77 fL — ABNORMAL LOW (ref 79–97)
MONOS ABS: 0.5 10*3/uL (ref 0.1–0.9)
Monocytes: 7 %
NEUTROS PCT: 51 %
Neutrophils Absolute: 3.5 10*3/uL (ref 1.4–7.0)
PLATELETS: 229 10*3/uL (ref 150–379)
RBC: 5.38 x10E6/uL — AB (ref 3.77–5.28)
RDW: 15.5 % — AB (ref 12.3–15.4)
WBC: 7 10*3/uL (ref 3.4–10.8)

## 2017-08-06 LAB — LIPID PANEL
Chol/HDL Ratio: 4.2 ratio (ref 0.0–4.4)
Cholesterol, Total: 214 mg/dL — ABNORMAL HIGH (ref 100–199)
HDL: 51 mg/dL (ref >39)
LDL Calculated: 145 mg/dL — ABNORMAL HIGH (ref 0–99)
TRIGLYCERIDES: 88 mg/dL (ref 0–149)
VLDL CHOLESTEROL CAL: 18 mg/dL (ref 5–40)

## 2017-08-06 LAB — CMP AND LIVER
ALBUMIN: 4.7 g/dL (ref 3.5–5.5)
ALK PHOS: 82 IU/L (ref 39–117)
ALT: 11 IU/L (ref 0–32)
AST: 17 IU/L (ref 0–40)
BILIRUBIN, DIRECT: 0.08 mg/dL (ref 0.00–0.40)
BUN: 13 mg/dL (ref 6–24)
Bilirubin Total: 0.2 mg/dL (ref 0.0–1.2)
CHLORIDE: 103 mmol/L (ref 96–106)
CO2: 21 mmol/L (ref 20–29)
Calcium: 8.8 mg/dL (ref 8.7–10.2)
Creatinine, Ser: 0.76 mg/dL (ref 0.57–1.00)
GFR calc Af Amer: 105 mL/min/{1.73_m2} (ref >59)
GFR calc non Af Amer: 91 mL/min/{1.73_m2} (ref >59)
Glucose: 92 mg/dL (ref 65–99)
POTASSIUM: 4 mmol/L (ref 3.5–5.2)
Sodium: 142 mmol/L (ref 134–144)
Total Protein: 7.5 g/dL (ref 6.0–8.5)

## 2017-08-06 LAB — VITAMIN D 25 HYDROXY (VIT D DEFICIENCY, FRACTURES): VIT D 25 HYDROXY: 18 ng/mL — AB (ref 30.0–100.0)

## 2017-08-08 ENCOUNTER — Other Ambulatory Visit: Payer: Self-pay | Admitting: Family Medicine

## 2017-08-08 DIAGNOSIS — E782 Mixed hyperlipidemia: Secondary | ICD-10-CM

## 2017-08-08 DIAGNOSIS — E559 Vitamin D deficiency, unspecified: Secondary | ICD-10-CM

## 2017-08-08 MED ORDER — VITAMIN D (ERGOCALCIFEROL) 1.25 MG (50000 UNIT) PO CAPS: ORAL_CAPSULE | ORAL | 0 refills | Status: DC

## 2017-08-08 MED ORDER — ATORVASTATIN CALCIUM 20 MG PO TABS: 20.0000 mg | ORAL_TABLET | Freq: Every day | ORAL | 2 refills | Status: DC

## 2017-08-09 ENCOUNTER — Telehealth: Payer: Self-pay

## 2017-08-09 NOTE — Telephone Encounter (Signed)
-----   Message from Alfonse Spruce, [place] sent at 08/08/2017  6:37 PM EDT ----- Labs that evaluated your  fluid and electrolyte balance are normal.  Labs that evaluated your blood cells show no signs of anemia or acute infection. Quit smoking. Vitamin D level was low. Vitamin D helps to keep bones strong. You were prescribed ergocalciferol (capsules) to increase your vitamin-d level.  Lipid levels were elevated. This can increase your risk of heart disease overtime. Start eating a diet low in saturated fat. Limit your intake of fried foods, red meats, and whole milk. Increase physical activity.  Recommend follow up in 3 months.

## 2017-08-09 NOTE — Telephone Encounter (Signed)
CMA call regarding lab results   Patient did not answer & unable to leave message VM is full  

## 2017-08-19 ENCOUNTER — Ambulatory Visit: Payer: BLUE CROSS/BLUE SHIELD | Admitting: *Deleted

## 2017-08-19 VITALS — Ht 68.0 in | Wt 200.2 lb

## 2017-08-19 DIAGNOSIS — Z1211 Encounter for screening for malignant neoplasm of colon: Secondary | ICD-10-CM

## 2017-08-19 MED ORDER — NA SULFATE-K SULFATE-MG SULF 17.5-3.13-1.6 GM/177ML PO SOLN: 1.0000 | Freq: Once | ORAL | 0 refills | Status: AC

## 2017-08-19 NOTE — Progress Notes (Signed)
Denies allergies to eggs or soy products. Denies complications with sedation or anesthesia. Denies O2 use. Denies use of diet or weight loss medications.  Emmi instructions given for colonoscopy.  

## 2017-08-26 ENCOUNTER — Encounter: Payer: Self-pay | Admitting: Gastroenterology

## 2017-09-02 ENCOUNTER — Encounter: Payer: Self-pay | Admitting: Gastroenterology

## 2017-09-02 ENCOUNTER — Ambulatory Visit (AMBULATORY_SURGERY_CENTER): Payer: BLUE CROSS/BLUE SHIELD | Admitting: Gastroenterology

## 2017-09-02 VITALS — BP 123/77 | HR 50 | Temp 98.0°F | Resp 13 | Ht 68.0 in | Wt 200.0 lb

## 2017-09-02 DIAGNOSIS — D122 Benign neoplasm of ascending colon: Secondary | ICD-10-CM

## 2017-09-02 DIAGNOSIS — Z1211 Encounter for screening for malignant neoplasm of colon: Secondary | ICD-10-CM

## 2017-09-02 MED ORDER — SODIUM CHLORIDE 0.9 % IV SOLN: 500.0000 mL | INTRAVENOUS | Status: DC

## 2017-09-02 NOTE — Progress Notes (Signed)
Called to room to assist during endoscopic procedure.  Patient ID and intended procedure confirmed with present staff. Received instructions for my participation in the procedure from the performing physician.  

## 2017-09-02 NOTE — Patient Instructions (Signed)
YOU HAD AN ENDOSCOPIC PROCEDURE TODAY AT THE [facility]:   Refer to the procedure report that was given to you for any specific questions about what was found during the examination.  If the procedure report does not answer your questions, please call your gastroenterologist to clarify.  If you requested that your care partner not be given the details of your procedure findings, then the procedure report has been included in a sealed envelope for you to review at your convenience later.  YOU SHOULD EXPECT: Some feelings of bloating in the abdomen. Passage of more gas than usual.  Walking can help get rid of the air that was put into your GI tract during the procedure and reduce the bloating. If you had a lower endoscopy (such as a colonoscopy or flexible sigmoidoscopy) you may notice spotting of blood in your stool or on the toilet paper. If you underwent a bowel prep for your procedure, you may not have a normal bowel movement for a few days.  Please Note:  You might notice some irritation and congestion in your nose or some drainage.  This is from the oxygen used during your procedure.  There is no need for concern and it should clear up in a day or so.  SYMPTOMS TO REPORT IMMEDIATELY:   Following lower endoscopy (colonoscopy or flexible sigmoidoscopy):  Excessive amounts of blood in the stool  Significant tenderness or worsening of abdominal pains  Swelling of the abdomen that is new, acute  Fever of 100F or higher   For urgent or emergent issues, a gastroenterologist can be reached at any hour by calling [phone].   DIET:  We do recommend a small meal at first, but then you may proceed to your regular diet.  Drink plenty of fluids but you should avoid alcoholic beverages for 24 hours.  ACTIVITY:  You should plan to take it easy for the rest of today and you should NOT DRIVE or use heavy machinery until tomorrow (because of the sedation medicines used during the test).     FOLLOW UP: Our staff will call the number listed on your records the next business day following your procedure to check on you and address any questions or concerns that you may have regarding the information given to you following your procedure. If we do not reach you, we will leave a message.  However, if you are feeling well and you are not experiencing any problems, there is no need to return our call.  We will assume that you have returned to your regular daily activities without incident.  If any biopsies were taken you will be contacted by phone or by letter within the next 1-3 weeks.  Please call us at [phone] if you have not heard about the biopsies in 3 weeks.    SIGNATURES/CONFIDENTIALITY: You and/or your care partner have signed paperwork which will be entered into your electronic medical record.  These signatures attest to the fact that that the information above on your After Visit Summary has been reviewed and is understood.  Full responsibility of the confidentiality of this discharge information lies with you and/or your care-partner.  Thank you for letting us take care of your healthcare needs today. 

## 2017-09-02 NOTE — Op Note (Signed)
[facility] Patient Name: Meagan Flores Procedure Date: 09/02/2017 11:41 AM MRN: [id number] Endoscopist: Remo Lipps P. Armbruster MD, MD Age: 51 Referring MD:  Date of Birth: 17-Nov-1966 Gender: Female Account #: [id number] Procedure:                Colonoscopy Indications:              Screening for colorectal malignant neoplasm, This                            is the patient's first colonoscopy Medicines:                Monitored Anesthesia Care Procedure:                Pre-Anesthesia Assessment:                           - Prior to the procedure, a History and Physical                            was performed, and patient medications and                            allergies were reviewed. The patient's tolerance of                            previous anesthesia was also reviewed. The risks                            and benefits of the procedure and the sedation                            options and risks were discussed with the patient.                            All questions were answered, and informed consent                            was obtained. Prior Anticoagulants: The patient has                            taken no previous anticoagulant or antiplatelet                            agents. ASA Grade Assessment: II - A patient with                            mild systemic disease. After reviewing the risks                            and benefits, the patient was deemed in                            satisfactory condition to undergo the procedure.  After obtaining informed consent, the colonoscope                            was passed under direct vision. Throughout the                            procedure, the patient's blood pressure, pulse, and                            oxygen saturations were monitored continuously. The                            Colonoscope was introduced through the anus and                            advanced to the the  cecum, identified by                            appendiceal orifice and ileocecal valve. The                            colonoscopy was performed without difficulty. The                            patient tolerated the procedure well. The quality                            of the bowel preparation was good. The ileocecal                            valve, appendiceal orifice, and rectum were                            photographed. Scope In: 11:47:34 AM Scope Out: 12:03:57 PM Scope Withdrawal Time: 0 hours 14 minutes 8 seconds  Total Procedure Duration: 0 hours 16 minutes 23 seconds  Findings:                 The perianal and digital rectal examinations were                            normal.                           A 4-5 mm polyp was found in the ascending colon.                            The polyp was sessile. The polyp was removed with a                            cold snare. Resection and retrieval were complete.                           The colon was tortuous.  Internal hemorrhoids were found during retroflexion.                           The exam was otherwise without abnormality. Complications:            No immediate complications. Estimated blood loss:                            Minimal. Estimated Blood Loss:     Estimated blood loss was minimal. Impression:               - One 4-5 mm polyp in the ascending colon, removed                            with a cold snare. Resected and retrieved.                           - Tortuous colon.                           - Internal hemorrhoids.                           - The examination was otherwise normal. Recommendation:           - Patient has a contact number available for                            emergencies. The signs and symptoms of potential                            delayed complications were discussed with the                            patient. Return to normal activities tomorrow.                             Written discharge instructions were provided to the                            patient.                           - Resume previous diet.                           - Continue present medications.                           - Await pathology results.                           - Repeat colonoscopy is recommended for                            surveillance. The colonoscopy date will be  determined after pathology results from today's                            exam become available for review. Remo Lipps P. Armbruster MD, MD 09/02/2017 12:07:35 PM This report has been signed electronically.

## 2017-09-02 NOTE — Progress Notes (Signed)
A and O x3. Report to RN. Tolerated MAC anesthesia well.

## 2017-09-02 NOTE — Progress Notes (Signed)
Pt's states no medical or surgical changes since previsit or office visit. 

## 2017-09-05 ENCOUNTER — Telehealth: Payer: Self-pay

## 2017-09-05 ENCOUNTER — Telehealth: Payer: Self-pay | Admitting: *Deleted

## 2017-09-05 NOTE — Telephone Encounter (Signed)
  Follow up Call-  Call back number 09/02/2017  Post procedure Call Back phone  # [phone]  Permission to leave phone message Yes  Some recent data might be hidden     Patient questions:  Do you have a fever, pain , or abdominal swelling? No. Pain Score  0 *  Have you tolerated food without any problems? Yes.    Have you been able to return to your normal activities? Yes.    Do you have any questions about your discharge instructions: Diet   No. Medications  No. Follow up visit  No.  Do you have questions or concerns about your Care? No.  Actions: * If pain score is 4 or above: No action needed, pain <4.

## 2017-09-05 NOTE — Telephone Encounter (Signed)
Called back and says she is doing well after her procedure on Friday.

## 2017-09-05 NOTE — Telephone Encounter (Signed)
Left message on answering machine. 

## 2017-09-07 ENCOUNTER — Encounter: Payer: Self-pay | Admitting: Family Medicine

## 2017-09-07 ENCOUNTER — Ambulatory Visit (INDEPENDENT_AMBULATORY_CARE_PROVIDER_SITE_OTHER): Payer: BLUE CROSS/BLUE SHIELD | Admitting: Family Medicine

## 2017-09-07 ENCOUNTER — Encounter: Payer: Self-pay | Admitting: Gastroenterology

## 2017-09-07 VITALS — BP 134/79 | Ht 69.0 in | Wt 200.0 lb

## 2017-09-07 DIAGNOSIS — M25511 Pain in right shoulder: Secondary | ICD-10-CM | POA: Diagnosis not present

## 2017-09-07 DIAGNOSIS — M25512 Pain in left shoulder: Secondary | ICD-10-CM

## 2017-09-07 NOTE — Patient Instructions (Addendum)
[facility] [address]. [place] [phone] Dr. Tamera Punt  Appt: Wednesday 09/14/2017 2 pm - arrive at 1:30 pm

## 2017-09-08 DIAGNOSIS — M25512 Pain in left shoulder: Secondary | ICD-10-CM

## 2017-09-08 DIAGNOSIS — M25511 Pain in right shoulder: Secondary | ICD-10-CM | POA: Insufficient documentation

## 2017-09-08 HISTORY — DX: Pain in left shoulder: M25.512

## 2017-09-08 NOTE — Progress Notes (Signed)
   HPI  CC: Bilateral shoulder pain Patient is a new patient to our clinic presenting today with bilateral shoulder pain. She is status post right-sided rotator cuff repair in February 2018. She states that she continues to have bilateral shoulder pain with all activity. It is directly affecting her ability to work. 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 Pain is equal bilaterally had located over the lateral aspect of the shoulders. She endorses minimal improvement after her right-sided shoulder repair. She endorsed good compliance with postop physical therapy. She denies any recent injury, trauma, or falls. No new weakness, numbness, or paresthesias.  Medications/Interventions Tried: Steroid injections, NSAIDs, right-sided rotator cuff repair  See HPI and/or previous note for associated ROS.  Objective: BP 134/79   Ht 5\' 9"  (1.753 m)   Wt 200 lb (90.7 kg)   BMI 29.53 kg/m  Gen: NAD, well groomed, a/o x3, normal affect.  CV: Well-perfused. Warm.  Resp: Non-labored.  Neuro: Sensation intact throughout. No gross coordination deficits.  Gait: Nonpathologic posture, unremarkable stride without signs of limp or balance issues. Shoulder, Bilateral: TTP noted at the anterolateral aspect of the shoulders bilaterally. No evidence of bony deformity, asymmetry, or muscle atrophy. Well-healed right-sided surgical incision sites; Moderate tenderness over long head of biceps (bicipital groove). No TTP at [facility] joint. Active ROM limited by pain. Passive ROM intact. Strength +4/5 throughout (possible effort limited). No abnormal scapular function observed. Sensation intact. Peripheral pulses intact.  Special Tests:   - Crossarm test: NEG   - Empty can: Positive Bilat   - Hawkins: Positive Bilat   - Obrien's test: Positive Bilat   - Yergason's: NEG   - Speeds test: Positive Bilat   Assessment and plan:  Bilateral shoulder pain Patient is presenting with bilateral shoulder pain. Exam  consistent with likely atraumatic bilateral rotatory cuff pathology. Unlikely to be complete tears. S/p right-sided rotator cuff repair. Most recent MRI showed evidence of [place] arthritic changes. After a long discussion with patient it appears as though she is very interested in a second surgical opinion regarding her shoulders. - Referral to [facility] w/ Dr. Tamera Punt for second opinion. - Discussed the possibility of subacromial and intra-articular shoulder injections. Patient not interested today. - F/u PRN   Orders Placed This Encounter  Procedures  . Ambulatory referral to Orthopedic Surgery    Referral Priority:   Routine    Referral Type:   Surgical    Referral Reason:   Specialty Services Required    Referred to Provider:   Tania Ade, MD    Requested Specialty:   Orthopedic Surgery    Number of Visits Requested:   1    Elberta Leatherwood, MD,MS [facility] Fellow 09/08/2017 9:44 AM

## 2017-09-08 NOTE — Assessment & Plan Note (Signed)
Patient is presenting with bilateral shoulder pain. Exam consistent with likely atraumatic bilateral rotatory cuff pathology. Unlikely to be complete tears. S/p right-sided rotator cuff repair. Most recent MRI showed evidence of [place] arthritic changes. After a long discussion with patient it appears as though she is very interested in a second surgical opinion regarding her shoulders. - Referral to [facility] w/ Dr. Tamera Punt for second opinion. - Discussed the possibility of subacromial and intra-articular shoulder injections. Patient not interested today. - F/u PRN

## 2017-09-21 NOTE — Telephone Encounter (Signed)
Note made  In error

## 2017-11-16 IMAGING — MR MR SHOULDER*R* W/O CM
5 of 6 series · 29 of 40 positions shown · non-contrast
Comparison: MRI dated 12/21/2016

CLINICAL DATA: Persistent right shoulder pain. Shoulder arthroscopy
with distal clavicle resection and subacromial decompression on
01/12/2017.

EXAM:
MRI OF THE RIGHT SHOULDER WITHOUT CONTRAST
TECHNIQUE: Multiplanar, multisequence MR imaging of the shoulder was performed.
No intravenous contrast was administered.

[Series 3: T2 fat-sat · axial · 4.0mm · 0.25mm/px · z∈[-2,+96]mm · 8 of 23 slices shown (1 of 4)]
[im 1/23]
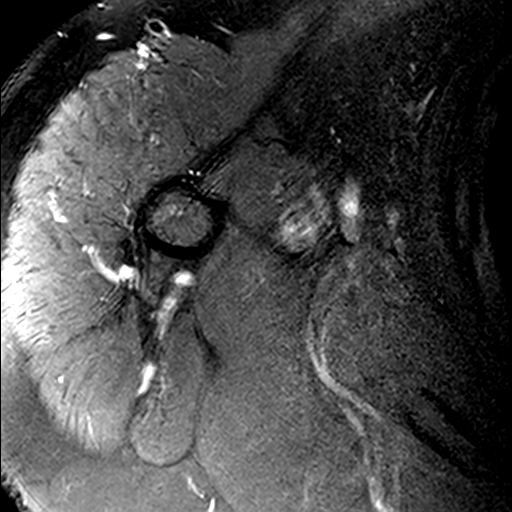
[im 4/23]
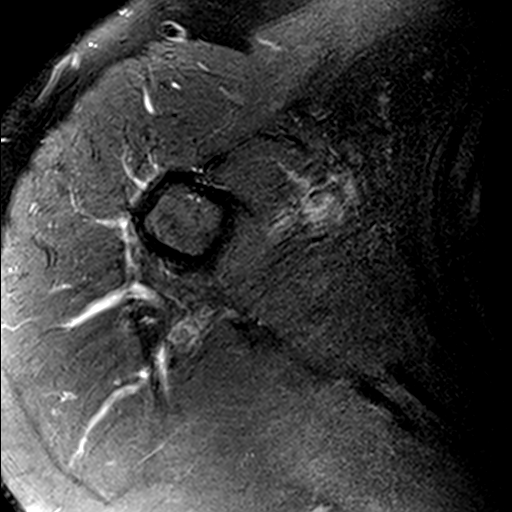
[im 7/23]
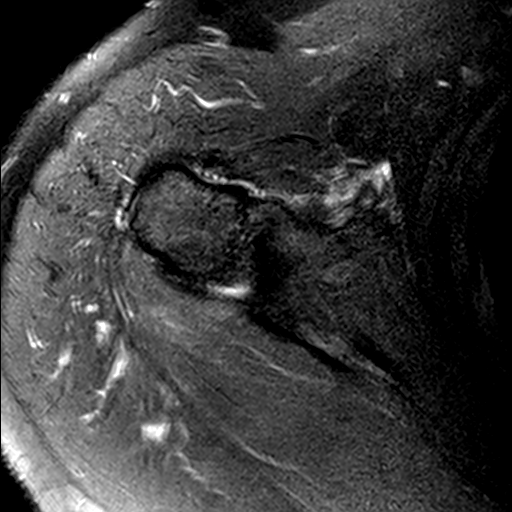
[im 10/23]
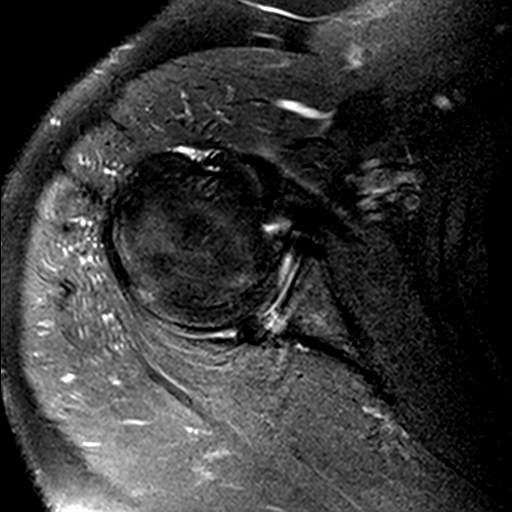
[im 13/23]
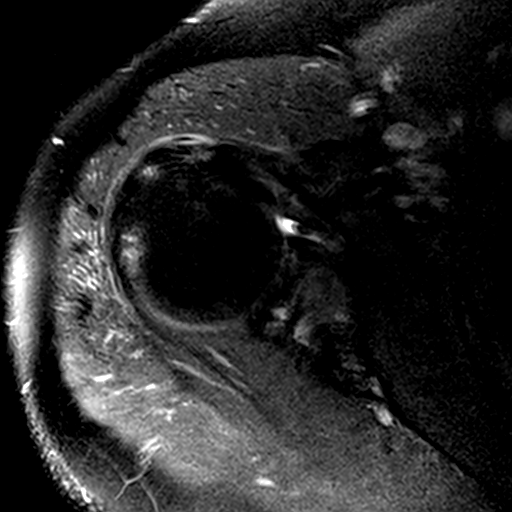
[im 16/23]
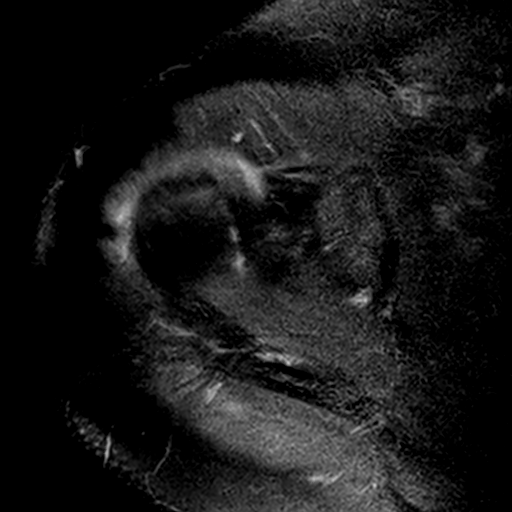
[im 19/23]
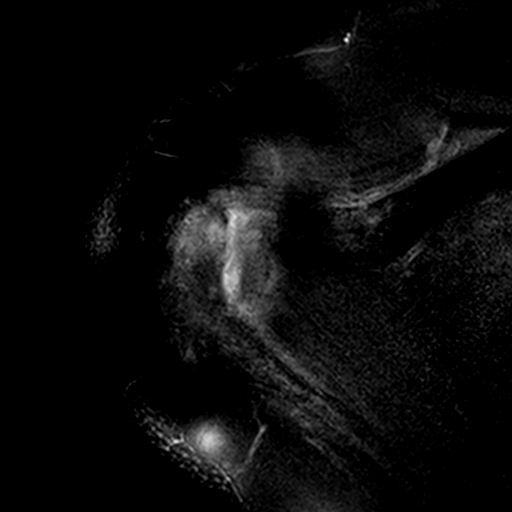
[im 23/23]
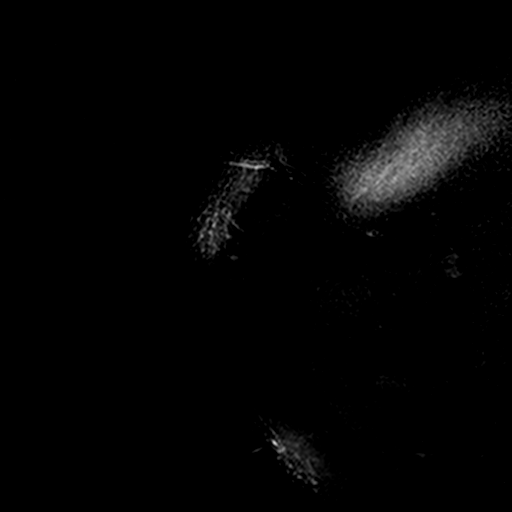

[Series 4: T2 fat-sat · oblique · 4.0mm · 0.55mm/px · 7 of 20 slices shown (2 of 4)]
[im 1/20]
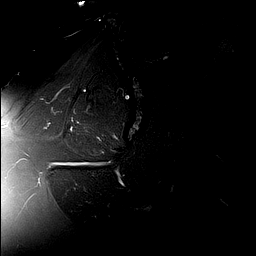
[im 4/20]
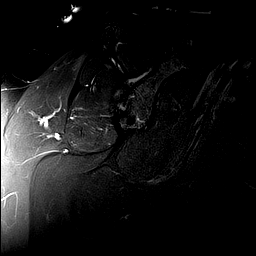
[im 7/20]
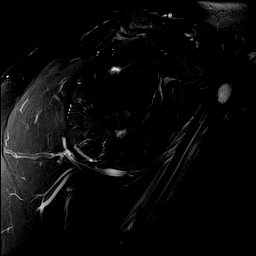
[im 10/20]
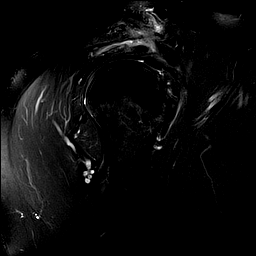
[im 13/20]
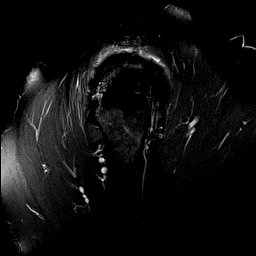
[im 16/20]
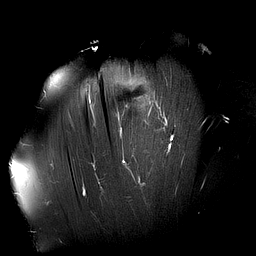
[im 20/20]
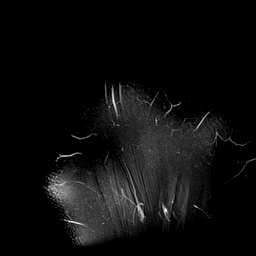

[Series 6: T2 fat-sat · oblique · 4.0mm · 0.55mm/px · 5 of 15 slices shown (3 of 4)]
[im 1/15]
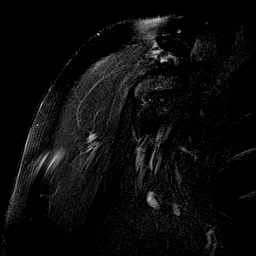
[im 4/15]
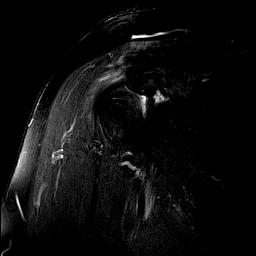
[im 8/15]
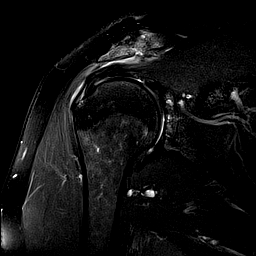
[im 11/15]
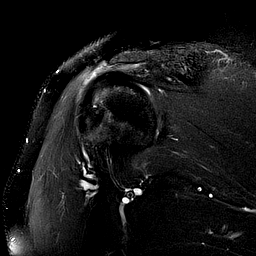
[im 15/15]
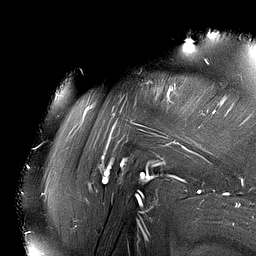

[Series 7: PD · oblique · 4.0mm · 0.27mm/px · 5 of 15 slices shown]
[im 1/15]
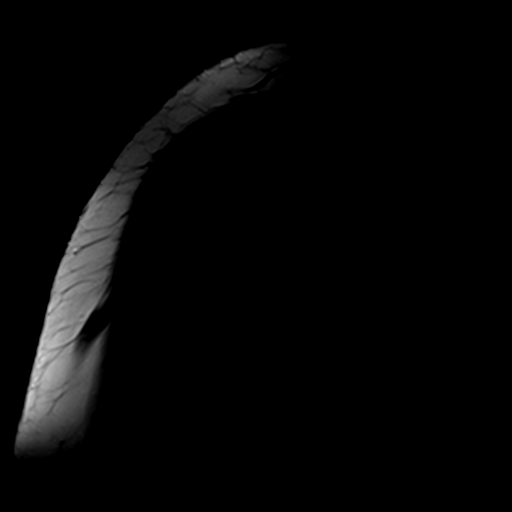
[im 4/15]
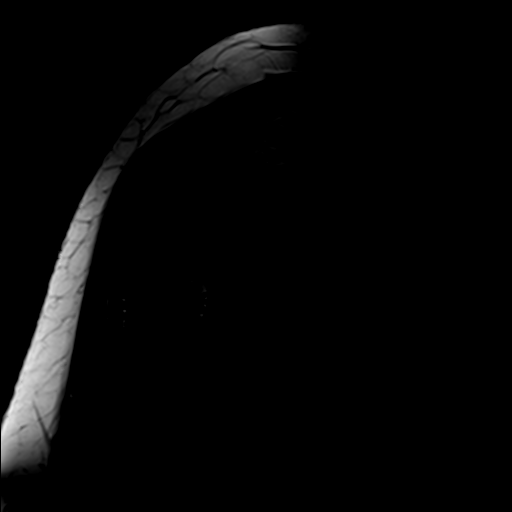
[im 8/15]
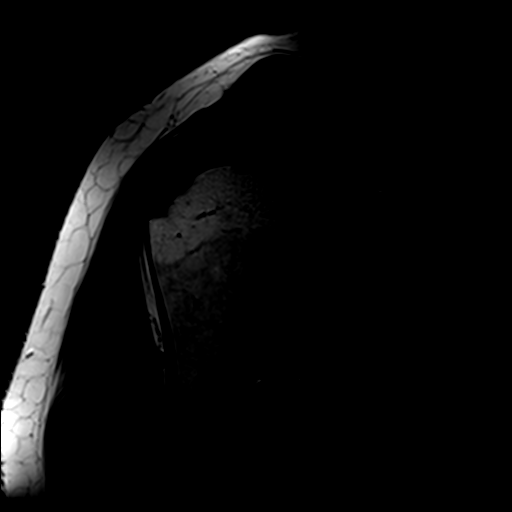
[im 11/15]
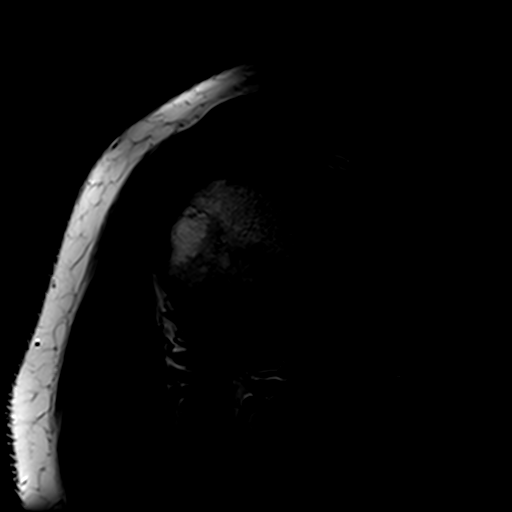
[im 15/15]
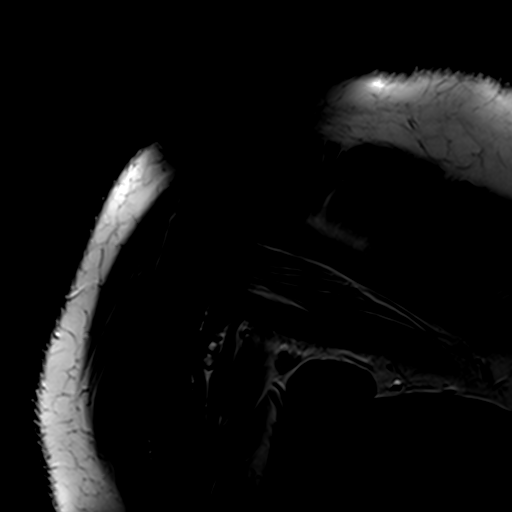

[Series 8: T2 fat-sat · axial · 4.0mm · 0.34mm/px · z∈[-2,+38]mm · 4 of 23 slices shown (4 of 4)]
[im 1/23]
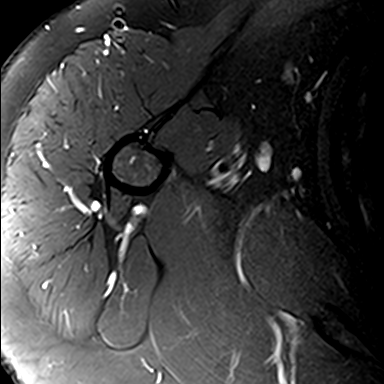
[im 4/23]
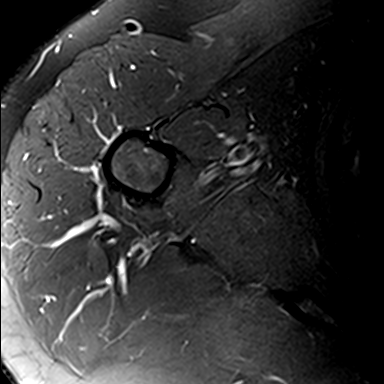
[im 7/23]
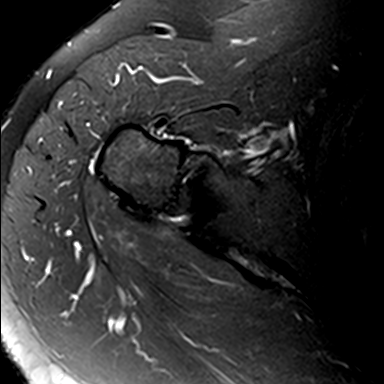
[im 10/23]
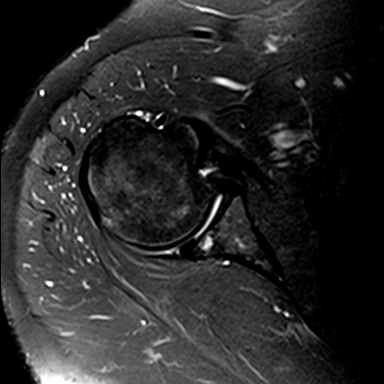

[29 of 40 positions shown; findings below may reference images not displayed]

FINDINGS: Rotator cuff: There is a focal intrasubstance tear of the distal
supraspinous tendon, less prominent than on the prior study. The
rotator cuff otherwise appears normal.

Muscles: No atrophy or abnormal signal of the muscles of the rotator
cuff.

Biceps long head:  Properly located and intact.

Acromioclavicular Joint: Interval partial resection of the distal
clavicle with subacromial decompression. There is no residual bone
impingement. However, there is more prominent fluid in the
subacromial/subdeltoid bursa than on the prior study.

Glenohumeral Joint: Again noted is grade 4 chondromalacia of the
posterior aspect of the glenoid, unchanged. No joint effusion.

Labrum:  Intact.

Bones:  No significant abnormality.

Other: None
IMPRESSION: 1. Inflammation of the subacromial/subdeltoid bursa.
2. Postsurgical changes and slight edema at the acromioclavicular
joint.
3. Less apparent intrasubstance tear of the distal supraspinous
tendon.
4. Focal chondromalacia of the posterior aspect of the glenoid,
stable.

## 2017-12-08 ENCOUNTER — Encounter: Payer: Self-pay | Admitting: Family Medicine

## 2018-01-25 ENCOUNTER — Telehealth: Payer: Self-pay | Admitting: Family Medicine

## 2018-01-25 NOTE — Telephone Encounter (Signed)
Patient called asking for a referral for dermatology for eczema

## 2018-02-14 NOTE — Telephone Encounter (Signed)
I called the patient back to inform her that she could not get the referral without an appointment. Patient did not answer so I left a vm for the patient to call back.

## 2018-02-14 NOTE — Telephone Encounter (Signed)
Patient needs an office visit to ensure appropriate referral diagnosis

## 2018-02-24 ENCOUNTER — Ambulatory Visit: Payer: BLUE CROSS/BLUE SHIELD | Admitting: Family Medicine

## 2018-04-14 ENCOUNTER — Observation Stay (HOSPITAL_COMMUNITY)
Admission: EM | Admit: 2018-04-14 | Discharge: 2018-04-16 | Disposition: A | Discharge status: Home / Self Care | Payer: BLUE CROSS/BLUE SHIELD | Attending: Family Medicine | Admitting: Family Medicine

## 2018-04-14 ENCOUNTER — Encounter (HOSPITAL_COMMUNITY): Payer: Self-pay | Admitting: Emergency Medicine

## 2018-04-14 ENCOUNTER — Other Ambulatory Visit: Payer: Self-pay

## 2018-04-14 DIAGNOSIS — Z8673 Personal history of transient ischemic attack (TIA), and cerebral infarction without residual deficits: Secondary | ICD-10-CM

## 2018-04-14 DIAGNOSIS — E782 Mixed hyperlipidemia: Secondary | ICD-10-CM

## 2018-04-14 DIAGNOSIS — I10 Essential (primary) hypertension: Secondary | ICD-10-CM | POA: Diagnosis not present

## 2018-04-14 DIAGNOSIS — E039 Hypothyroidism, unspecified: Secondary | ICD-10-CM | POA: Insufficient documentation

## 2018-04-14 DIAGNOSIS — F17200 Nicotine dependence, unspecified, uncomplicated: Secondary | ICD-10-CM | POA: Diagnosis not present

## 2018-04-14 DIAGNOSIS — E785 Hyperlipidemia, unspecified: Secondary | ICD-10-CM | POA: Diagnosis not present

## 2018-04-14 DIAGNOSIS — I63331 Cerebral infarction due to thrombosis of right posterior cerebral artery: Secondary | ICD-10-CM

## 2018-04-14 DIAGNOSIS — Z79899 Other long term (current) drug therapy: Secondary | ICD-10-CM | POA: Insufficient documentation

## 2018-04-14 DIAGNOSIS — G459 Transient cerebral ischemic attack, unspecified: Principal | ICD-10-CM | POA: Insufficient documentation

## 2018-04-14 DIAGNOSIS — R0602 Shortness of breath: Secondary | ICD-10-CM | POA: Diagnosis present

## 2018-04-14 DIAGNOSIS — E079 Disorder of thyroid, unspecified: Secondary | ICD-10-CM | POA: Diagnosis present

## 2018-04-14 HISTORY — DX: Essential (primary) hypertension: I10

## 2018-04-14 NOTE — ED Triage Notes (Signed)
Per EMS pt from home.  Increased weakness x 2 days. Tonight felt somewhat better, had company, drank one 12 oz beer. Got into shower and began to feel hot and weak.  Normal po intake. No hx of hypertension.  Had thyroid removal and hysterectomy years ago.  Has had increased stress level recently.

## 2018-04-15 ENCOUNTER — Emergency Department (HOSPITAL_COMMUNITY): Payer: BLUE CROSS/BLUE SHIELD

## 2018-04-15 ENCOUNTER — Observation Stay (HOSPITAL_COMMUNITY): Payer: BLUE CROSS/BLUE SHIELD

## 2018-04-15 ENCOUNTER — Encounter (HOSPITAL_COMMUNITY): Payer: BLUE CROSS/BLUE SHIELD

## 2018-04-15 ENCOUNTER — Encounter (HOSPITAL_COMMUNITY): Payer: Self-pay | Admitting: Internal Medicine

## 2018-04-15 DIAGNOSIS — F17200 Nicotine dependence, unspecified, uncomplicated: Secondary | ICD-10-CM

## 2018-04-15 DIAGNOSIS — G459 Transient cerebral ischemic attack, unspecified: Secondary | ICD-10-CM | POA: Diagnosis present

## 2018-04-15 DIAGNOSIS — I10 Essential (primary) hypertension: Secondary | ICD-10-CM

## 2018-04-15 DIAGNOSIS — I63331 Cerebral infarction due to thrombosis of right posterior cerebral artery: Secondary | ICD-10-CM

## 2018-04-15 DIAGNOSIS — Z8673 Personal history of transient ischemic attack (TIA), and cerebral infarction without residual deficits: Secondary | ICD-10-CM

## 2018-04-15 DIAGNOSIS — E782 Mixed hyperlipidemia: Secondary | ICD-10-CM

## 2018-04-15 HISTORY — DX: Transient cerebral ischemic attack, unspecified: G45.9

## 2018-04-15 LAB — URINALYSIS, ROUTINE W REFLEX MICROSCOPIC
BACTERIA UA: NONE SEEN
BILIRUBIN URINE: NEGATIVE
Glucose, UA: NEGATIVE mg/dL
Ketones, ur: NEGATIVE mg/dL
LEUKOCYTES UA: NEGATIVE
NITRITE: NEGATIVE
Protein, ur: NEGATIVE mg/dL
SPECIFIC GRAVITY, URINE: 1.001 — AB (ref 1.005–1.030)
pH: 6 (ref 5.0–8.0)

## 2018-04-15 LAB — DIFFERENTIAL
Abs Immature Granulocytes: 0 10*3/uL (ref 0.0–0.1)
BASOS ABS: 0 10*3/uL (ref 0.0–0.1)
Basophils Relative: 0 %
EOS PCT: 1 %
Eosinophils Absolute: 0.1 10*3/uL (ref 0.0–0.7)
Immature Granulocytes: 1 %
LYMPHS ABS: 2.8 10*3/uL (ref 0.7–4.0)
LYMPHS PCT: 37 %
MONO ABS: 0.6 10*3/uL (ref 0.1–1.0)
Monocytes Relative: 8 %
Neutro Abs: 4 10*3/uL (ref 1.7–7.7)
Neutrophils Relative %: 53 %

## 2018-04-15 LAB — CBC
HEMATOCRIT: 40.3 % (ref 36.0–46.0)
Hemoglobin: 12.7 g/dL (ref 12.0–15.0)
MCH: 23.2 pg — ABNORMAL LOW (ref 26.0–34.0)
MCHC: 31.5 g/dL (ref 30.0–36.0)
MCV: 73.7 fL — ABNORMAL LOW (ref 78.0–100.0)
PLATELETS: 238 10*3/uL (ref 150–400)
RBC: 5.47 MIL/uL — ABNORMAL HIGH (ref 3.87–5.11)
RDW: 15.4 % (ref 11.5–15.5)
WBC: 7.6 10*3/uL (ref 4.0–10.5)

## 2018-04-15 LAB — COMPREHENSIVE METABOLIC PANEL
ALK PHOS: 78 U/L (ref 38–126)
ALT: 11 U/L — ABNORMAL LOW (ref 14–54)
ANION GAP: 10 (ref 5–15)
AST: 15 U/L (ref 15–41)
Albumin: 3.9 g/dL (ref 3.5–5.0)
BILIRUBIN TOTAL: 0.3 mg/dL (ref 0.3–1.2)
BUN: 6 mg/dL (ref 6–20)
CALCIUM: 8.7 mg/dL — AB (ref 8.9–10.3)
CO2: 24 mmol/L (ref 22–32)
Chloride: 108 mmol/L (ref 101–111)
Creatinine, Ser: 0.61 mg/dL (ref 0.44–1.00)
GFR calc non Af Amer: >60 mL/min (ref >60)
Glucose, Bld: 95 mg/dL (ref 65–99)
Potassium: 3.8 mmol/L (ref 3.5–5.1)
Sodium: 142 mmol/L (ref 135–145)
TOTAL PROTEIN: 7.3 g/dL (ref 6.5–8.1)

## 2018-04-15 LAB — I-STAT CHEM 8, ED
BUN: 7 mg/dL (ref 6–20)
Calcium, Ion: 1.02 mmol/L — ABNORMAL LOW (ref 1.15–1.40)
Chloride: 106 mmol/L (ref 101–111)
Creatinine, Ser: 0.5 mg/dL (ref 0.44–1.00)
Glucose, Bld: 96 mg/dL (ref 65–99)
HEMATOCRIT: 40 % (ref 36.0–46.0)
Hemoglobin: 13.6 g/dL (ref 12.0–15.0)
POTASSIUM: 3.8 mmol/L (ref 3.5–5.1)
SODIUM: 143 mmol/L (ref 135–145)
TCO2: 24 mmol/L (ref 22–32)

## 2018-04-15 LAB — LIPID PANEL
Cholesterol: 203 mg/dL — ABNORMAL HIGH (ref 0–200)
HDL: 45 mg/dL (ref >40)
LDL CALC: 130 mg/dL — AB (ref 0–99)
Total CHOL/HDL Ratio: 4.5 RATIO
Triglycerides: 140 mg/dL (ref <150)
VLDL: 28 mg/dL (ref 0–40)

## 2018-04-15 LAB — I-STAT TROPONIN, ED: Troponin i, poc: 0 ng/mL (ref 0.00–0.08)

## 2018-04-15 LAB — PROTIME-INR
INR: 1.12
PROTHROMBIN TIME: 14.3 s (ref 11.4–15.2)

## 2018-04-15 LAB — GLUCOSE, CAPILLARY: GLUCOSE-CAPILLARY: 134 mg/dL — AB (ref 65–99)

## 2018-04-15 LAB — RAPID URINE DRUG SCREEN, HOSP PERFORMED
AMPHETAMINES: NOT DETECTED
BARBITURATES: NOT DETECTED
Benzodiazepines: NOT DETECTED
COCAINE: NOT DETECTED
Opiates: NOT DETECTED
TETRAHYDROCANNABINOL: NOT DETECTED

## 2018-04-15 LAB — HEMOGLOBIN A1C
Hgb A1c MFr Bld: 5.9 % — ABNORMAL HIGH (ref 4.8–5.6)
Mean Plasma Glucose: 122.63 mg/dL

## 2018-04-15 LAB — APTT: APTT: 43 s — AB (ref 24–36)

## 2018-04-15 LAB — ETHANOL: Alcohol, Ethyl (B): 11 mg/dL — ABNORMAL HIGH (ref <10)

## 2018-04-15 MED ORDER — ACETAMINOPHEN 650 MG RE SUPP: 650.0000 mg | RECTAL | Status: DC | PRN

## 2018-04-15 MED ORDER — ASPIRIN 300 MG RE SUPP: 300.0000 mg | Freq: Every day | RECTAL | Status: DC

## 2018-04-15 MED ORDER — IOPAMIDOL (ISOVUE-370) INJECTION 76%
INTRAVENOUS | Status: AC
  Filled 2018-04-15: qty 50

## 2018-04-15 MED ORDER — ASPIRIN 325 MG PO TABS
325.0000 mg | ORAL_TABLET | Freq: Every day | ORAL | Status: DC
  Administered 2018-04-15 – 2018-04-16 (×2): 325 mg via ORAL
  Filled 2018-04-15 (×2): qty 1

## 2018-04-15 MED ORDER — ACETAMINOPHEN 325 MG PO TABS
650.0000 mg | ORAL_TABLET | ORAL | Status: DC | PRN
Start: 2018-04-15 — End: 2018-04-16

## 2018-04-15 MED ORDER — SODIUM CHLORIDE 0.9 % IV SOLN: INTRAVENOUS | Status: AC

## 2018-04-15 MED ORDER — STROKE: EARLY STAGES OF RECOVERY BOOK
Freq: Once | Status: AC
  Administered 2018-04-15: 1
  Filled 2018-04-15: qty 1

## 2018-04-15 MED ORDER — IOPAMIDOL (ISOVUE-370) INJECTION 76%
50.0000 mL | Freq: Once | INTRAVENOUS | Status: AC | PRN
  Administered 2018-04-15: 50 mL via INTRAVENOUS

## 2018-04-15 MED ORDER — ACETAMINOPHEN 160 MG/5ML PO SOLN: 650.0000 mg | ORAL | Status: DC | PRN

## 2018-04-15 MED ORDER — CLOBETASOL PROPIONATE 0.05 % EX OINT
TOPICAL_OINTMENT | Freq: Two times a day (BID) | CUTANEOUS | Status: DC
  Administered 2018-04-15 – 2018-04-16 (×2): via TOPICAL
  Filled 2018-04-15: qty 15

## 2018-04-15 MED ORDER — ONDANSETRON HCL 4 MG/2ML IJ SOLN
4.0000 mg | Freq: Four times a day (QID) | INTRAMUSCULAR | Status: DC | PRN
  Administered 2018-04-15: 4 mg via INTRAVENOUS
  Filled 2018-04-15: qty 2

## 2018-04-15 MED ORDER — ENOXAPARIN SODIUM 40 MG/0.4ML ~~LOC~~ SOLN
40.0000 mg | SUBCUTANEOUS | Status: DC
  Administered 2018-04-15 – 2018-04-16 (×2): 40 mg via SUBCUTANEOUS
  Filled 2018-04-15 (×2): qty 0.4

## 2018-04-15 MED ORDER — LEVOTHYROXINE SODIUM 125 MCG PO TABS
125.0000 ug | ORAL_TABLET | Freq: Every day | ORAL | Status: DC
  Administered 2018-04-15 – 2018-04-16 (×2): 125 ug via ORAL
  Filled 2018-04-15 (×2): qty 1

## 2018-04-15 NOTE — Consult Note (Signed)
Neurology Consultation Reason for Consult: Left-sided weakness Referring Physician: Ward, K  CC: Left-sided weakness  History is obtained from: Patient  HPI: Meagan Flores is a 52 y.o. female who presents with a transient episode of left-sided weakness that has mostly resolved.  She states that she was laying in bed when she had sudden onset of paresthesias and "heaviness" throughout the left side.  The significant weakness lasted for about 5 minutes then improved, though she still has some mild symptoms.  Given that her symptoms are relatively mild, I did not feel that she was an IV TPA candidate and I discussed this with her and her family and she agreed.   LKW: 11 PM tpa given?: no, mild symptoms NIHSS: 1  ROS: A 14 point ROS was performed and is negative except as noted in the HPI.  Past Medical History:  Diagnosis Date  . Hyperlipidemia   . Hypothyroidism   . Thyroid disease      Family History  Problem Relation Age of Onset  . Stomach cancer Sister   . Colon polyps Sister   . Esophageal cancer Neg Hx   . Rectal cancer Neg Hx   . Colon cancer Neg Hx      Social History:  reports that she has been smoking.  She has a 8.50 pack-year smoking history. She has never used smokeless tobacco. She reports that she drinks alcohol. She reports that she does not use drugs.   Exam: Current vital signs: BP (!) 148/92   Pulse 72   Temp 98.1 F (36.7 C)   Resp 16   Ht 5\' 8"  (1.727 m)   Wt 86.2 kg (190 lb)   SpO2 96%   BMI 28.89 kg/m  Vital signs in last 24 hours: Temp:  [98.1 F (36.7 C)-98.4 F (36.9 C)] 98.1 F (36.7 C) (05/18 0232) Pulse Rate:  [63-72] 72 (05/18 0145) Resp:  [15-18] 16 (05/18 0145) BP: (134-149)/(92-97) 148/92 (05/18 0145) SpO2:  [96 %-99 %] 96 % (05/18 0145) Weight:  [86.2 kg (190 lb)] 86.2 kg (190 lb) (05/17 2350)   Physical Exam  Constitutional: Appears well-developed and well-nourished.  Psych: Affect appropriate to situation Eyes: No  scleral injection HENT: No OP obstrucion Head: Normocephalic.  Cardiovascular: Normal rate and regular rhythm.  Respiratory: Effort normal, non-labored breathing GI: Soft.  No distension. There is no tenderness.  Skin: WDI  Neuro: Mental Status: Patient is awake, alert, oriented to person, place, month, year, and situation. Patient is able to give a clear and coherent history. No signs of aphasia or neglect Cranial Nerves: II: Visual Fields are full. Pupils are equal, round, and reactive to light.   III,IV, VI: EOMI without ptosis or diploplia.  V: Facial sensation is decreased on the left VII: Facial movement is symmetric.  VIII: hearing is intact to voice X: Uvula elevates symmetrically XI: Shoulder shrug is symmetric. XII: tongue is midline without atrophy or fasciculations.  Motor: Tone is normal. Bulk is normal. 5/5 strength was present on the right, 4+/5 weakness in the left arm and leg without significant drift  sensory: Sensation is diminished in the left arm and leg  Cerebellar: No clear ataxia on finger-nose-finger or heel-knee-shin  I have reviewed labs in epic and the results pertinent to this consultation are: CMP-unremarkable  I have reviewed the images obtained: CT head- unremarkable  Impression: 52 year old female with transient episode of left-sided weakness that rapidly improved, though still with some symptoms.  At this time, I suspect  that this represents small ischemic stroke versus TIA.  She will need to be admitted for further evaluation.  Recommendations: 1. HgbA1c, fasting lipid panel 2. MRI, MRA  of the brain without contrast 3. Frequent neuro checks 4. Echocardiogram 5. Carotid dopplers 6. Prophylactic therapy-Antiplatelet med: Aspirin - dose 325mg  PO or 300mg  PR 7. Risk factor modification 8. Telemetry monitoring 9. PT consult, OT consult, Speech consult 10. please page stroke NP  Or  PA  Or MD  from 8am -4 pm as this patient will be followed  by the stroke team at this point.   You can look them up on [email or web address]      Roland Rack, MD [facility] [phone]  If 7pm- 7am, please page neurology on call as listed in [place].

## 2018-04-15 NOTE — H&P (Signed)
History and Physical    Meagan Flores ENM:[id number] DOB: December 13, 1965 DOA: 04/14/2018  PCP: Alfonse Spruce, FNP  Patient coming from: Home.  Chief Complaint: Left-sided weakness.  HPI: Meagan Flores is a 52 y.o. female with history of thyroidectomy with Synthroid replacement, tobacco abuse started experiencing left upper and lower extremity numbness weakness along with the facial numbness on the left side around 10:30 PM last evening at home.  Patient's weakness resolved within 10 minutes but the numbness persisted and decided to come to the ER.  Patient had initially some difficulty walking due to the weakness.  Denies any difficulty speaking swallowing or visual symptoms.  Denies any headache.  Patient was with her friends and has had some beer with the episode happened.  ED Course: In the ER patient appears nonfocal but still complains of numbness in the left side of the upper extremity and face.  CT head was unremarkable neurologist on-call was consulted patient admitted for further management of possible TIA versus stroke.  Patient passed swallow.  Patient states he only drinks very rarely.  Review of Systems: As per HPI, rest all negative.   Past Medical History:  Diagnosis Date  . Hyperlipidemia   . Hypothyroidism   . Thyroid disease     Past Surgical History:  Procedure Laterality Date  . PARTIAL HYSTERECTOMY    . right arm sx    . SHOULDER ARTHROSCOPY WITH DISTAL CLAVICLE RESECTION Right 01/12/2017   Procedure: RIGHT SHOULDER ARTHROSCOPY WITH  SUBACROMIAL DECOMPRESSION, DISTAL CLAVICLE EXCISION, extensive debridement;  Surgeon: Leandrew Koyanagi, MD;  Location: [facility];  Service: Orthopedics;  Laterality: Right;  . THYROIDECTOMY       reports that she has been smoking.  She has a 8.50 pack-year smoking history. She has never used smokeless tobacco. She reports that she drinks alcohol. She reports that she does not use drugs.  Allergies  Allergen Reactions    . Sulfa Antibiotics Hives    Family History  Problem Relation Age of Onset  . Stomach cancer Sister   . Colon polyps Sister   . Esophageal cancer Neg Hx   . Rectal cancer Neg Hx   . Colon cancer Neg Hx     Prior to Admission medications   Medication Sig Start Date End Date Taking? Authorizing Provider  levothyroxine (SYNTHROID, LEVOTHROID) 125 MCG tablet Take 1 tablet (125 mcg total) by mouth daily before breakfast. 07/20/17  Yes Hairston, Mandesia R, FNP  triamcinolone cream (KENALOG) 0.1 % Apply 1 application topically 2 (two) times daily. Patient not taking: Reported on 04/15/2018 07/04/17   Alfonse Spruce, FNP    Physical Exam: Vitals:   04/15/18 0100 04/15/18 0130 04/15/18 0145 04/15/18 0232  BP: (!) 134/93 (!) 141/93 (!) 148/92   Pulse: 68 64 72   Resp: 17 15 16    Temp:    98.1 F (36.7 C)  TempSrc:      SpO2: 97% 99% 96%   Weight:      Height:          Constitutional: Moderately built and nourished. Vitals:   04/15/18 0100 04/15/18 0130 04/15/18 0145 04/15/18 0232  BP: (!) 134/93 (!) 141/93 (!) 148/92   Pulse: 68 64 72   Resp: 17 15 16    Temp:    98.1 F (36.7 C)  TempSrc:      SpO2: 97% 99% 96%   Weight:      Height:  Eyes: Anicteric no pallor. ENMT: No discharge from the ears eyes nose or mouth. Neck: No mass palpated no neck rigidity.  No JVD appreciated. Respiratory: No rhonchi or crepitations. Cardiovascular: S1-S2 heard no murmurs appreciated. Abdomen: Soft nontender bowel sounds present. Musculoskeletal: No edema.  No joint effusion. Skin: No rash.  Skin appears warm. Neurologic: Alert awake oriented to time place and person.  Moves all extremities 5 x 5.  No facial asymmetry tongue is midline.  Pupils are equal and reacting to light. Psychiatric: Appears normal.  Normal affect.   Labs on Admission: I have personally reviewed following labs and imaging studies  CBC: Recent Labs  Lab 04/15/18 0058 04/15/18 0106  WBC 7.6  --    NEUTROABS 4.0  --   HGB 12.7 13.6  HCT 40.3 40.0  MCV 73.7*  --   PLT 238  --    Basic Metabolic Panel: Recent Labs  Lab 04/15/18 0058 04/15/18 0106  NA 142 143  K 3.8 3.8  CL 108 106  CO2 24  --   GLUCOSE 95 96  BUN 6 7  CREATININE 0.61 0.50  CALCIUM 8.7*  --    GFR: Estimated Creatinine Clearance: 94.5 mL/min (by C-G formula based on SCr of 0.5 mg/dL). Liver Function Tests: Recent Labs  Lab 04/15/18 0058  AST 15  ALT 11*  ALKPHOS 78  BILITOT 0.3  PROT 7.3  ALBUMIN 3.9   No results for input(s): LIPASE, AMYLASE in the last 168 hours. No results for input(s): AMMONIA in the last 168 hours. Coagulation Profile: Recent Labs  Lab 04/15/18 0058  INR 1.12   Cardiac Enzymes: No results for input(s): CKTOTAL, CKMB, CKMBINDEX, TROPONINI in the last 168 hours. BNP (last 3 results) No results for input(s): PROBNP in the last 8760 hours. HbA1C: No results for input(s): HGBA1C in the last 72 hours. CBG: No results for input(s): GLUCAP in the last 168 hours. Lipid Profile: No results for input(s): CHOL, HDL, LDLCALC, TRIG, CHOLHDL, LDLDIRECT in the last 72 hours. Thyroid Function Tests: No results for input(s): TSH, T4TOTAL, FREET4, T3FREE, THYROIDAB in the last 72 hours. Anemia Panel: No results for input(s): VITAMINB12, FOLATE, FERRITIN, TIBC, IRON, RETICCTPCT in the last 72 hours. Urine analysis:    Component Value Date/Time   COLORURINE COLORLESS (A) 04/15/2018 0018   APPEARANCEUR CLEAR 04/15/2018 0018   LABSPEC 1.001 (L) 04/15/2018 0018   PHURINE 6.0 04/15/2018 0018   GLUCOSEU NEGATIVE 04/15/2018 0018   HGBUR SMALL (A) 04/15/2018 0018   BILIRUBINUR NEGATIVE 04/15/2018 0018   KETONESUR NEGATIVE 04/15/2018 0018   PROTEINUR NEGATIVE 04/15/2018 0018   UROBILINOGEN 0.2 07/04/2013 0950   NITRITE NEGATIVE 04/15/2018 0018   LEUKOCYTESUR NEGATIVE 04/15/2018 0018   Sepsis Labs: @LABRCNTIP (procalcitonin:4,lacticidven:4) )No results found for this or any  previous visit (from the past 240 hour(s)).   Radiological Exams on Admission: Dg Chest 2 View  Result Date: 04/15/2018 CLINICAL DATA:  Acute onset of generalized weakness and shortness of breath. EXAM: CHEST - 2 VIEW COMPARISON:  Chest radiograph performed 01/22/2016 FINDINGS: The lungs are well-aerated and clear. There is no evidence of focal opacification, pleural effusion or pneumothorax. The heart is borderline normal in size. Postoperative change is noted at the superior mediastinum. No acute osseous abnormalities are seen. IMPRESSION: No acute cardiopulmonary process seen. Electronically Signed   By: Garald Balding M.D.   On: 04/15/2018 01:48   Ct Head Wo Contrast  Result Date: 04/15/2018 CLINICAL DATA:  Acute onset of generalized weakness. EXAM: CT  HEAD WITHOUT CONTRAST TECHNIQUE: Contiguous axial images were obtained from the base of the skull through the vertex without intravenous contrast. COMPARISON:  None. FINDINGS: Brain: No evidence of acute infarction, hemorrhage, hydrocephalus, extra-axial collection or mass lesion/mass effect. The posterior fossa, including the cerebellum, brainstem and fourth ventricle, is within normal limits. The third and lateral ventricles, and basal ganglia are unremarkable in appearance. The cerebral hemispheres are symmetric in appearance, with normal gray-white differentiation. No mass effect or midline shift is seen. Vascular: No hyperdense vessel or unexpected calcification. Skull: There is no evidence of fracture; visualized osseous structures are unremarkable in appearance. Sinuses/Orbits: The visualized portions of the orbits are within normal limits. The paranasal sinuses and mastoid air cells are well-aerated. Other: No significant soft tissue abnormalities are seen. IMPRESSION: Unremarkable noncontrast CT of the head. Electronically Signed   By: Garald Balding M.D.   On: 04/15/2018 01:49    EKG: Independently reviewed.  Normal sinus  rhythm.  Assessment/Plan Principal Problem:   TIA (transient ischemic attack) Active Problems:   Thyroid disease    1. TIA versus stroke -patient has passed swallow.  Patient is on aspirin.  Will keep patient on neurochecks check MRI/MRA brain 2D echo carotid Doppler neurology on case.  Check hemoglobin A1c and lipid panel. 2. Postoperative hypothyroidism on Synthroid. 3. Tobacco abuse advised to quit smoking.   DVT prophylaxis: Lovenox. Code Status: Full code. Family Communication: Daughter at the bedside. Disposition Plan: Home. Consults called: Neurology. Admission status: Observation.   Rise Patience MD [facility] Pager [phone].  If 7PM-7AM, please contact night-coverage [email or web address] Password [email or web address]  04/15/2018, 6:08 AM

## 2018-04-15 NOTE — Progress Notes (Signed)
Late entry for 04/15/18 @ 0600: patient arrived to floor via stretcher, accompanied by daughter. Alert and Oriented X 4, patient ambulated to bathroom and then to her bed with one assist. Armband checked, patient was placed on cardiac monitor, which was called in to CCMD.  Skin intact, with the exception of scattered patches of what patient states is exzema. Oriented to room, hospital book given. NIHSS done with a score of 0. Bed alarm on. Safety maintained.

## 2018-04-15 NOTE — Progress Notes (Signed)
Patient seen and examined at bedside, patient admitted after midnight, please see earlier detailed admission note by Rise Patience, MD. Briefly, patient presented with left sided weakness concerning for stroke. Still with residual left leg weakness. Also noticed evidence of dry skin, possibly eczema. Itchy. Will provide clobetasol for treatment; 2 week course. Stroke workup per neurology recommendations.   Cordelia Poche, MD [facility] 04/15/2018, 8:15 AM Pager: [phone]

## 2018-04-15 NOTE — Progress Notes (Signed)
STROKE TEAM PROGRESS NOTE   SUBJECTIVE (INTERVAL HISTORY) Her daughter and grandsons are at the bedside.  Pt still lethargic with left sided numbness and tingling. MRI pending. No acute event overnight.   OBJECTIVE Temp:  [98.1 F (36.7 C)-98.4 F (36.9 C)] 98.3 F (36.8 C) (05/18 0600) Pulse Rate:  [63-72] 65 (05/18 0600) Cardiac Rhythm: Normal sinus rhythm (05/18 0700) Resp:  [15-18] 18 (05/18 0600) BP: (134-150)/(92-97) 150/94 (05/18 0600) SpO2:  [96 %-99 %] 99 % (05/18 0600) Weight:  [190 lb (86.2 kg)-191 lb 9.3 oz (86.9 kg)] 191 lb 9.3 oz (86.9 kg) (05/18 0600)  CBC:  Recent Labs  Lab 04/15/18 0058 04/15/18 0106  WBC 7.6  --   NEUTROABS 4.0  --   HGB 12.7 13.6  HCT 40.3 40.0  MCV 73.7*  --   PLT 238  --     Basic Metabolic Panel:  Recent Labs  Lab 04/15/18 0058 04/15/18 0106  NA 142 143  K 3.8 3.8  CL 108 106  CO2 24  --   GLUCOSE 95 96  BUN 6 7  CREATININE 0.61 0.50  CALCIUM 8.7*  --     Lipid Panel:     Component Value Date/Time   CHOL 203 (H) 04/15/2018 0058   CHOL 214 (H) 08/05/2017 1645   TRIG 140 04/15/2018 0058   HDL 45 04/15/2018 0058   HDL 51 08/05/2017 1645   CHOLHDL 4.5 04/15/2018 0058   VLDL 28 04/15/2018 0058   LDLCALC 130 (H) 04/15/2018 0058   LDLCALC 145 (H) 08/05/2017 1645   HgbA1c: No results found for: HGBA1C Urine Drug Screen:     Component Value Date/Time   LABOPIA NONE DETECTED 04/15/2018 0018   COCAINSCRNUR NONE DETECTED 04/15/2018 0018   LABBENZ NONE DETECTED 04/15/2018 0018   AMPHETMU NONE DETECTED 04/15/2018 0018   THCU NONE DETECTED 04/15/2018 0018   LABBARB NONE DETECTED 04/15/2018 0018    Alcohol Level     Component Value Date/Time   ETH 11 (H) 04/15/2018 0058    IMAGING I have personally reviewed the radiological images below and agree with the radiology interpretations.  Ct Head Wo Contrast 04/15/2018 IMPRESSION:  Unremarkable noncontrast CT of the head.   MR Brain Wo Contrast - pending   CTA Head  and Neck - pending   Transthoracic Echocardiogram - pending    PHYSICAL EXAM  Temp:  [98.1 F (36.7 C)-98.4 F (36.9 C)] 98.3 F (36.8 C) (05/18 0600) Pulse Rate:  [56-72] 56 (05/18 1608) Resp:  [15-18] 18 (05/18 0600) BP: (110-150)/(82-97) 110/82 (05/18 1608) SpO2:  [96 %-100 %] 100 % (05/18 1608) Weight:  [190 lb (86.2 kg)-191 lb 9.3 oz (86.9 kg)] 191 lb 9.3 oz (86.9 kg) (05/18 0600)  General - Well nourished, well developed, in no apparent distress, mildly lethargic.  Ophthalmologic - Fundi not visualized due to noncooperation.  Cardiovascular - Regular rate and rhythm  Mental Status -  Level of arousal and orientation to time, place, and person were intact. Language including expression, naming, repetition, comprehension was assessed and found intact. Fund of Knowledge was assessed and was intact.  Cranial Nerves II - XII - II - Visual field intact OU. III, IV, VI - Extraocular movements intact. V - left facial sensation decreased, however, does have frontal tuning fork midline split. VII - Facial movement intact bilaterally. VIII - Hearing & vestibular intact bilaterally. X - Palate elevates symmetrically. XI - Chin turning & shoulder shrug intact bilaterally. XII - Tongue protrusion intact.  Motor Strength - The patient's strength was normal in all extremities and pronator drift was absent.  Bulk was normal and fasciculations were absent.   Motor Tone - Muscle tone was assessed at the neck and appendages and was normal.  Reflexes - The patient's reflexes were 1+ in all extremities and she had no pathological reflexes.  Sensory - Light touch, temperature/pinprick were assessed and were decreased on the left UE and LE.    Coordination - The patient had normal movements in the hands with no ataxia or dysmetria.  Tremor was absent.  Gait and Station - deferred   ASSESSMENT/PLAN Ms. TAMANIKA HEINEY is a 52 y.o. female with history of hyperlipidemia, hypothyroidism,  and tobacco use presenting with transient left-sided weakness. She did not receive IV t-PA due to mild deficits.  Suspected right thalamic infarct - MRI pending  Resultant  Left sided numbness and tingling  CT head - unremarkable  MRI head - pending  CTA H&N - pending  2D Echo - pending  LDL - 130  HgbA1c - pending  UDS neg  VTE prophylaxis - Lovenox  No antithrombotic prior to admission, now on aspirin 325 mg daily.  Patient counseled to be compliant with her antithrombotic medications  Ongoing aggressive stroke risk factor management  Therapy recommendations:  pending  Disposition:  Pending  Hypertension  Stable  Permissive hypertension (OK if < 220/120) but gradually normalize in 5-7 days . Long-term BP goal normotensive  Hyperlipidemia  Lipid lowering medication PTA:  none  LDL 130, goal < 70  Start Lipitor 40 mg daily  Continue statin at discharge  Tobacco abuse  Current smoker  Smoking cessation counseling provided  Pt is willing to quit  Other Stroke Risk Factors  ETOH use, advised to drink no more than 1 alcoholic beverage per day.  Obesity  Other Active Problems  Elevated TG  Hospital day # 0  Rosalin Hawking, MD PhD Stroke Neurology 04/15/2018 5:03 PM  To contact Stroke Continuity provider, please refer to [email or web address] After hours, contact General Neurology

## 2018-04-15 NOTE — ED Notes (Signed)
IV team currently starting IV and will draw labs. 

## 2018-04-15 NOTE — Evaluation (Signed)
Physical Therapy Evaluation Patient Details Name: Meagan Flores MRN: [id number] DOB: Dec 09, 1965 Today's Date: 04/15/2018   History of Present Illness  52 y.o. female with history of thyroidectomy with Synthroid replacement, tobacco abuse started experiencing left upper and lower extremity numbness weakness along with the facial numbness on the left side. Neuro work up underway.   Clinical Impression  Orders received for PT evaluation. Patient demonstrates deficits in functional mobility as indicated below. Will benefit from continued skilled PT to address deficits and maximize function. Will see as indicated and progress as tolerated.      Follow Up Recommendations Home health PT;Supervision for mobility/OOB    Equipment Recommendations  (Patient does not want RW)    Recommendations for Other Services       Precautions / Restrictions Precautions Precautions: Fall      Mobility  Bed Mobility Overal bed mobility: Modified Independent             General bed mobility comments: increased time to perform, no physical assist or cues required. use of LUE to push to upright in sitting position when coming to the left side  Transfers Overall transfer level: Needs assistance Equipment used: None Transfers: Sit to/from Stand Sit to Stand: Supervision         General transfer comment: supervision for safety, no physical assist required  Ambulation/Gait Ambulation/Gait assistance: Min guard Ambulation Distance (Feet): 160 Feet(10 ft without assistive device) Assistive device: Rolling walker (2 wheeled) Gait Pattern/deviations: Step-through pattern;Decreased stride length;Decreased step length - left;Decreased weight shift to left;Narrow base of support Gait velocity: decreased Gait velocity interpretation: <1.8 ft/sec, indicate of risk for recurrent falls General Gait Details: varied and inconsistent gait deviations. At times unable to stride but at other times no difficulty.  VCs for increased gait speed, improved stability with increased cadence. patient perseverative on LLE weakness  Stairs Stairs: Yes Stairs assistance: Min assist Stair Management: One rail Right;Step to pattern Number of Stairs: 3 General stair comments: VCs for technique and sequencing. Educated on how to utilize HHA at home due to no Research officer, trade union Rankin (Stroke Patients Only) Modified Rankin (Stroke Patients Only) Pre-Morbid Rankin Score: No symptoms Modified Rankin: Moderate disability     Balance Overall balance assessment: Needs assistance   Sitting balance-Leahy Scale: Good Sitting balance - Comments: sat EOB without issue     Standing balance-Leahy Scale: Fair Standing balance comment: able to stand without UE support                             Pertinent Vitals/Pain Pain Assessment: 0-10 Pain Score: 4  Pain Location: head Pain Descriptors / Indicators: Headache Pain Intervention(s): Monitored during session    Home Living Family/patient expects to be discharged to:: Private residence Living Arrangements: Spouse/significant other;Children Available Help at Discharge: Family Type of Home: House Home Access: Stairs to enter Entrance Stairs-Rails: None Technical brewer of Steps: 3 Home Layout: One level Home Equipment: None      Prior Function Level of Independence: Independent         Comments: reoprts that she works, runs around with her grand children and is very independent     Hand Dominance   Dominant Hand: Right    Extremity/Trunk Assessment   Upper Extremity Assessment Upper Extremity Assessment: Defer to OT evaluation    Lower Extremity Assessment Lower Extremity Assessment: LLE deficits/detail LLE Deficits / Details: some inconsistencies with strength  assessment. Fluctuating weakness upon testing LLE Sensation: decreased light touch(reports feels numb) LLE Coordination: decreased fine  motor;decreased gross motor       Communication      Cognition Arousal/Alertness: Awake/alert Behavior During Therapy: Flat affect Overall Cognitive Status: Within Functional Limits for tasks assessed                                        General Comments      Exercises     Assessment/Plan    PT Assessment Patient needs continued PT services  PT Problem List Decreased strength;Decreased activity tolerance;Decreased balance;Decreased mobility;Decreased safety awareness;Impaired sensation       PT Treatment Interventions DME instruction;Gait training;Stair training;Functional mobility training;Therapeutic exercise;Therapeutic activities;Neuromuscular re-education;Patient/family education    PT Goals (Current goals can be found in the Care Plan section)  Acute Rehab PT Goals Patient Stated Goal: to go home PT Goal Formulation: With patient/family Time For Goal Achievement: 04/29/18 Potential to Achieve Goals: Good    Frequency Min 3X/week   Barriers to discharge        Co-evaluation               AM-PAC PT "6 Clicks" Daily Activity  Outcome Measure Difficulty turning over in bed (including adjusting bedclothes, sheets and blankets)?: A Little Difficulty moving from lying on back to sitting on the side of the bed? : A Little Difficulty sitting down on and standing up from a chair with arms (e.g., wheelchair, bedside commode, etc,.)?: A Little Help needed moving to and from a bed to chair (including a wheelchair)?: A Little Help needed walking in hospital room?: A Little Help needed climbing 3-5 steps with a railing? : A Little 6 Click Score: 18    End of Session Equipment Utilized During Treatment: Gait belt Activity Tolerance: Patient tolerated treatment well Patient left: in bed;with call bell/phone within reach;with family/visitor present Nurse Communication: Mobility status PT Visit Diagnosis: Difficulty in walking, not elsewhere  classified (R26.2);Other symptoms and signs involving the nervous system (R29.898)    Time: 6811-5726 PT Time Calculation (min) (ACUTE ONLY): 20 min   Charges:   PT Evaluation $PT Eval Moderate Complexity: 1 Mod     PT G Codes:        Alben Deeds, PT DPT  Board Certified Neurologic Specialist [place] 04/15/2018, 10:27 AM

## 2018-04-15 NOTE — ED Provider Notes (Signed)
TIME SEEN: 12:09 AM  CHIEF COMPLAINT: Left-sided weakness, shortness of breath  HPI: Patient is a 52 year old female with history of hypothyroidism, hyperlipidemia, obesity who presents to the emergency department with feeling weak for the past couple of days.  States that she has not been feeling well and then tonight around 11 PM while lying in the bed she noticed that her left arm and left leg felt very heavy and she had a hard time lifting them off the bed.  Also reported having some tingling in the left side of her face.  No other numbness or tingling.  Left arm and leg heaviness has improved and lasted for minutes.  Still having tingling in the face.  Denies headache, head injury.  Not on blood thinners.  No recent fevers, cough, vomiting, diarrhea.  Has been nauseated.  No chest pain.  States with this episode tonight she did feel short of breath for about 5 minutes.  Shortness of breath has now resolved.  No history of stroke or TIA in the past.  ROS: See HPI, limited as patient is a very poor historian Constitutional: no fever  Eyes: no drainage  ENT: no runny nose   Cardiovascular:  no chest pain  Resp: no SOB  GI: no vomiting GU: no dysuria Integumentary: no rash  Allergy: no hives  Musculoskeletal: no leg swelling  Neurological: no slurred speech ROS otherwise negative  PAST MEDICAL HISTORY/PAST SURGICAL HISTORY:  Past Medical History:  Diagnosis Date  . Hyperlipidemia   . Hypothyroidism   . Thyroid disease     MEDICATIONS:  Prior to Admission medications   Medication Sig Start Date End Date Taking? Authorizing Provider  levothyroxine (SYNTHROID, LEVOTHROID) 125 MCG tablet Take 1 tablet (125 mcg total) by mouth daily before breakfast. 07/20/17   Alfonse Spruce, FNP  triamcinolone cream (KENALOG) 0.1 % Apply 1 application topically 2 (two) times daily. 07/04/17   Alfonse Spruce, FNP    ALLERGIES:  Allergies  Allergen Reactions  . Sulfa Antibiotics Hives     SOCIAL HISTORY:  Social History   Tobacco Use  . Smoking status: Current Every Day Smoker    Packs/day: 0.25    Years: 34.00    Pack years: 8.50  . Smokeless tobacco: Never Used  Substance Use Topics  . Alcohol use: Yes    Comment: occassional    FAMILY HISTORY: Family History  Problem Relation Age of Onset  . Stomach cancer Sister   . Colon polyps Sister   . Esophageal cancer Neg Hx   . Rectal cancer Neg Hx   . Colon cancer Neg Hx     EXAM: BP (!) 149/97 (BP Location: Right Arm)   Pulse 63   Temp 98.4 F (36.9 C) (Oral)   Resp 16   Ht 5\' 8"  (1.727 m)   Wt 86.2 kg (190 lb)   SpO2 97%   BMI 28.89 kg/m  CONSTITUTIONAL: Alert and oriented and responds appropriately to questions.  In no distress, chronically ill-appearing HEAD: Normocephalic EYES: Conjunctivae clear, pupils appear equal, EOMI ENT: normal nose; moist mucous membranes NECK: Supple, no meningismus, no nuchal rigidity, no LAD  CARD: RRR; S1 and S2 appreciated; no murmurs, no clicks, no rubs, no gallops RESP: Normal chest excursion without splinting or tachypnea; breath sounds clear and equal bilaterally; no wheezes, no rhonchi, no rales, no hypoxia or respiratory distress, speaking full sentences ABD/GI: Normal bowel sounds; non-distended; soft, non-tender, no rebound, no guarding, no peritoneal signs, no hepatosplenomegaly BACK:  The back appears normal and is non-tender to palpation, there is no CVA tenderness EXT: Normal ROM in all joints; non-tender to palpation; no edema; normal capillary refill; no cyanosis, no calf tenderness or swelling    SKIN: Normal color for age and race; warm; no rash NEURO: Moves all extremities equally, no pronator drift, strength 5-/5 in all 4 extremities but likely secondary to poor effort, reports diminished sensation in the left side of her face compared to the right but otherwise normal sensation throughout her extremities, cranial nerves II through XII intact, normal  speech PSYCH: The patient's mood and manner are appropriate. Grooming and personal hygiene are appropriate.  MEDICAL DECISION MAKING: Patient here with left-sided weakness and facial numbness that started at 11 PM last night.  Symptoms are improving.  This time her NIH stroke scale would only be a 1.  I do not feel she is a TPA candidate given symptoms are improving and given her low stroke scale.  Will not initiate code stroke at this time but will obtain labs, urine, EKG, head CT.  Also obtain troponin and a chest x-ray given complaints of shortness of breath earlier this evening that has resolved.  Will discuss with neurology.  ED PROGRESS: Patient's work-up thus far has been unremarkable.  Head CT shows no acute abnormality.  Discussed with Dr. Leonel Ramsay with neurology who will see patient in consult.  2:30 AM  Dr. Leonel Ramsay with neurology has seen patient.  Appreciate his help.  He agrees that patient needs medical admission for TIA work-up.   2:43 AM Discussed patient's case with hospitalist, Dr. Hal Hope.  I have recommended admission and patient (and family if present) agree with this plan. Admitting physician will place admission orders.   I reviewed all nursing notes, vitals, pertinent previous records, EKGs, lab and urine results, imaging (as available).     EKG Interpretation  Date/Time:  Saturday Apr 15 2018 00:06:11 EDT Ventricular Rate:  61 PR Interval:    QRS Duration: 96 QT Interval:  483 QTC Calculation: 487 R Axis:   -68 Text Interpretation:  Sinus rhythm Left anterior fascicular block Probable anterior infarct, age indeterminate No significant change since last tracing Confirmed by Sebastiano Luecke, Cyril Mourning [id number]) on 04/15/2018 2:13:50 AM         Meleane Selinger, Delice Bison, DO 04/15/18 [id number]

## 2018-04-15 NOTE — Evaluation (Signed)
Occupational Therapy Evaluation Patient Details Name: Meagan Flores MRN: [id number] DOB: 19-Apr-1966 Today's Date: 04/15/2018    History of Present Illness 52 y.o. female with history of thyroidectomy with Synthroid replacement, tobacco abuse started experiencing left upper and lower extremity numbness weakness along with the facial numbness on the left side. Neuro work up underway.    Clinical Impression   Pt admitted with above. She demonstrates the below listed deficits and will benefit from continued OT to maximize safety and independence with BADLs.  Pt presents to OT with very mild Lt UE and Lt LE weakness, decreased activity tolerance (reports constant fatigue), and impaired saccades to the Lt.  She requires supervision with ADLs and functional mobility.  She was fully independent PTA and works full time in a Engineer, maintenance.  Will follow acutely.  Anticipate good progress.       Follow Up Recommendations  No OT follow up;Supervision - Intermittent    Equipment Recommendations  Tub/shower seat    Recommendations for Other Services       Precautions / Restrictions Precautions Precautions: Fall      Mobility Bed Mobility Overal bed mobility: Modified Independent             General bed mobility comments: increased time to perform, no physical assist or cues required. use of LUE to push to upright in sitting position when coming to the left side  Transfers Overall transfer level: Needs assistance Equipment used: None Transfers: Sit to/from Stand Sit to Stand: Supervision         General transfer comment: supervision for safety, no physical assist required    Balance Overall balance assessment: Needs assistance   Sitting balance-Leahy Scale: Good Sitting balance - Comments: sat EOB without issue     Standing balance-Leahy Scale: Fair Standing balance comment: static standing with supervision                            ADL  either performed or assessed with clinical judgement   ADL Overall ADL's : Needs assistance/impaired Eating/Feeding: Independent   Grooming: Wash/dry hands;Wash/dry face;Oral care;Brushing hair;Min guard;Standing   Upper Body Bathing: Supervision/ safety;Sitting   Lower Body Bathing: Supervison/ safety;Sit to/from stand   Upper Body Dressing : Set up;Sitting   Lower Body Dressing: Supervision/safety;Sit to/from stand   Toilet Transfer: Supervision/safety;Comfort height toilet;Ambulation   Toileting- Clothing Manipulation and Hygiene: Supervision/safety;Sit to/from stand       Functional mobility during ADLs: Supervision/safety General ADL Comments: Pt with very mild unsteadiness, but doesn't loose her balance      Vision Baseline Vision/History: No visual deficits Patient Visual Report: No change from baseline Vision Assessment?: Yes Eye Alignment: Within Functional Limits Ocular Range of Motion: Within Functional Limits Alignment/Gaze Preference: Within Defined Limits Tracking/Visual Pursuits: Able to track stimulus in all quads without difficulty Saccades: Undershoots Convergence: Within functional limits Additional Comments: Pt undershoots with dynamic saccades to the left -also indicates dizziness      Perception Perception Perception Tested?: Yes   Praxis Praxis Praxis tested?: Within functional limits    Pertinent Vitals/Pain Pain Assessment: Faces Faces Pain Scale: No hurt     Hand Dominance Right   Extremity/Trunk Assessment Upper Extremity Assessment Upper Extremity Assessment: LUE deficits/detail LUE Deficits / Details: 4+/5.  She is able to translate objects in Lt hand.  She indicates Lt UE doesn't feel the same at the right    Lower Extremity Assessment Lower Extremity  Assessment: Defer to PT evaluation       Communication Communication Communication: No difficulties(low volume )   Cognition Arousal/Alertness: Awake/alert Behavior During  Therapy: Flat affect Overall Cognitive Status: Within Functional Limits for tasks assessed                                     General Comments  daughters present     Exercises     Shoulder Instructions      Home Living Family/patient expects to be discharged to:: Private residence Living Arrangements: Spouse/significant other;Children Available Help at Discharge: Family Type of Home: House Home Access: Stairs to enter Technical brewer of Steps: 3 Entrance Stairs-Rails: None Home Layout: One level     Bathroom Shower/Tub: Teacher, early years/pre: [place]: None          Prior Functioning/Environment Level of Independence: Independent        Comments: 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  States "I have to always go"         OT Problem List: Decreased strength;Decreased activity tolerance;Impaired balance (sitting and/or standing)      OT Treatment/Interventions: Self-care/ADL training;DME and/or AE instruction;Therapeutic activities;Visual/perceptual remediation/compensation;Patient/family education;Balance training    OT Goals(Current goals can be found in the care plan section) Acute Rehab OT Goals Patient Stated Goal: to get back to normal.  driving, going places  OT Goal Formulation: With patient Time For Goal Achievement: 04/29/18 Potential to Achieve Goals: Good ADL Goals Pt Will Perform Grooming: Independently;standing Pt Will Perform Upper Body Bathing: Independently;standing;sitting Pt Will Perform Lower Body Bathing: Independently;sit to/from stand Pt Will Perform Upper Body Dressing: Independently;sitting;standing Pt Will Perform Lower Body Dressing: Independently;sit to/from stand Pt Will Transfer to Toilet: Independently;ambulating;regular height toilet Pt Will Perform Toileting -  Clothing Manipulation and hygiene: Independently;sit to/from stand Pt Will Perform Tub/Shower Transfer: Tub transfer;with modified independence;ambulating;shower seat Additional ADL Goal #1: Pt will read full page of information independently with normal speed  OT Frequency: Min 2X/week   Barriers to D/C:            Co-evaluation              AM-PAC PT "6 Clicks" Daily Activity     Outcome Measure Help from another person eating meals?: None Help from another person taking care of personal grooming?: A Little Help from another person toileting, which includes using toliet, bedpan, or urinal?: A Little Help from another person bathing (including washing, rinsing, drying)?: A Little Help from another person to put on and taking off regular upper body clothing?: A Little Help from another person to put on and taking off regular lower body clothing?: A Little 6 Click Score: 19   End of Session Nurse Communication: Mobility status  Activity Tolerance: Patient limited by fatigue Patient left: in bed;with call bell/phone within reach;with family/visitor present;with bed alarm set  OT Visit Diagnosis: Unsteadiness on feet (R26.81)                Time: 1445-1511 OT Time Calculation (min): 26 min Charges:  OT General Charges $OT Visit: 1 Visit OT Evaluation $OT Eval Moderate Complexity: 1 Mod OT Treatments $Therapeutic Activity: 8-22 mins G-Codes:     [facility], OTR/L [phone]   Lucille Passy M 04/15/2018, 4:07  PM

## 2018-04-16 ENCOUNTER — Observation Stay (HOSPITAL_BASED_OUTPATIENT_CLINIC_OR_DEPARTMENT_OTHER): Payer: BLUE CROSS/BLUE SHIELD

## 2018-04-16 ENCOUNTER — Other Ambulatory Visit: Payer: Self-pay

## 2018-04-16 ENCOUNTER — Observation Stay (HOSPITAL_COMMUNITY): Payer: BLUE CROSS/BLUE SHIELD

## 2018-04-16 ENCOUNTER — Encounter (HOSPITAL_COMMUNITY): Payer: Self-pay | Admitting: General Practice

## 2018-04-16 DIAGNOSIS — G459 Transient cerebral ischemic attack, unspecified: Secondary | ICD-10-CM | POA: Diagnosis not present

## 2018-04-16 DIAGNOSIS — I10 Essential (primary) hypertension: Secondary | ICD-10-CM

## 2018-04-16 DIAGNOSIS — I63331 Cerebral infarction due to thrombosis of right posterior cerebral artery: Secondary | ICD-10-CM | POA: Diagnosis not present

## 2018-04-16 LAB — ECHOCARDIOGRAM COMPLETE
Height: 68 in
Weight: 3065.28 oz

## 2018-04-16 LAB — GLUCOSE, CAPILLARY
Glucose-Capillary: 108 mg/dL — ABNORMAL HIGH (ref 65–99)
Glucose-Capillary: 89 mg/dL (ref 65–99)

## 2018-04-16 MED ORDER — CLOPIDOGREL BISULFATE 75 MG PO TABS: 75.0000 mg | ORAL_TABLET | Freq: Every day | ORAL | 0 refills | Status: AC

## 2018-04-16 MED ORDER — ASPIRIN 81 MG PO TABS: 81.0000 mg | ORAL_TABLET | Freq: Every day | ORAL | 0 refills | Status: DC

## 2018-04-16 MED ORDER — ATORVASTATIN CALCIUM 40 MG PO TABS: 40.0000 mg | ORAL_TABLET | Freq: Every day | ORAL | Status: DC

## 2018-04-16 MED ORDER — CLOBETASOL PROPIONATE 0.05 % EX OINT: TOPICAL_OINTMENT | Freq: Two times a day (BID) | CUTANEOUS | 0 refills | Status: DC

## 2018-04-16 MED ORDER — ATORVASTATIN CALCIUM 40 MG PO TABS: 40.0000 mg | ORAL_TABLET | Freq: Every day | ORAL | 0 refills | Status: DC

## 2018-04-16 NOTE — Progress Notes (Signed)
Occupational Therapy Treatment Patient Details Name: Meagan Flores MRN: [id number] DOB: 1966-04-20 Today's Date: 04/16/2018    History of present illness 52 y.o. female with history of thyroidectomy with Synthroid replacement, tobacco abuse started experiencing left upper and lower extremity numbness weakness along with the facial numbness on the left side. Neuro work up underway.    OT comments  Pt. Making gains with acute OT goals.  Able to complete bed mobility along with grooming and toileting tasks.  Educated on ways to incorporate use of LUE/hand for strengthening and functional use during adls.  Pt. And spouse had a lot of questions about her recent health events and TIA.  Answered questions, provided emotional support, and referenced stroke information book that had been provided to them.    Follow Up Recommendations  No OT follow up;Supervision - Intermittent    Equipment Recommendations  Tub/shower seat    Recommendations for Other Services      Precautions / Restrictions Precautions Precautions: Fall Restrictions Weight Bearing Restrictions: No       Mobility Bed Mobility Overal bed mobility: Modified Independent             General bed mobility comments: hob flat, exits on L side, height of bed adjusted to allow simulated bed at home. (very  high bed)  Transfers Overall transfer level: Needs assistance Equipment used: None Transfers: Sit to/from [facility] Sit to Stand: Supervision;Min guard Stand pivot transfers: Supervision;Min guard       General transfer comment: supervision for safety, no physical assist required    Balance                                           ADL either performed or assessed with clinical judgement   ADL Overall ADL's : Needs assistance/impaired     Grooming: Wash/dry hands;Min guard;Standing                   Toilet Transfer: Min guard;Comfort height toilet;Ambulation    Toileting- Clothing Manipulation and Hygiene: Supervision/safety;Sitting/lateral lean   Tub/ Shower Transfer: Tub transfer;Minimal Museum/gallery conservator Details (indicate cue type and reason): had pt. clear simulated tub height for R faucet, reviewed not attempting without assistance Functional mobility during ADLs: Supervision/safety;Min guard General ADL Comments: Pt with very mild unsteadiness, but doesn't loose her balance. reviewed ways to incorporate use of L hand in daily functional tasks to increase functional use. pt. is right handed so her tendancy is to compensate with rue.  also reviewed recommendations to read out loud. pt. and spouse report she was reading to him last night.  pt. and spouse had several questions about what a TIA was? reviewed and answered all questions.  they showed me the stroke information book they had received.  reviewed that with them as well.  pt. feels overwhelmed states "nothing like this has ever happened to me before".  provided emotional support and encouragement.       Vision       Perception     Praxis      Cognition Arousal/Alertness: Awake/alert Behavior During Therapy: Flat affect Overall Cognitive Status: Within Functional Limits for tasks assessed                                 General Comments: admits to feeling  a little overwhelmed with recent events and dx.        Exercises     Shoulder Instructions       General Comments      Pertinent Vitals/ Pain       Pain Assessment: No/denies pain  Home Living                                          Prior Functioning/Environment              Frequency  Min 2X/week        Progress Toward Goals  OT Goals(current goals can now be found in the care plan section)  Progress towards OT goals: Progressing toward goals     Plan      Co-evaluation                 AM-PAC PT "6 Clicks" Daily Activity     Outcome  Measure   Help from another person eating meals?: None Help from another person taking care of personal grooming?: A Little Help from another person toileting, which includes using toliet, bedpan, or urinal?: A Little Help from another person bathing (including washing, rinsing, drying)?: A Little Help from another person to put on and taking off regular upper body clothing?: A Little Help from another person to put on and taking off regular lower body clothing?: A Little 6 Click Score: 19    End of Session Equipment Utilized During Treatment: Gait belt  OT Visit Diagnosis: Unsteadiness on feet (R26.81)   Activity Tolerance Patient tolerated treatment well   Patient Left in chair;with call bell/phone within reach;with family/visitor present   Nurse Communication          Time: 2683-4196 OT Time Calculation (min): 27 min  Charges: OT General Charges $OT Visit: 1 Visit OT Treatments $Self Care/Home Management : 23-37 mins   Janice Coffin, COTA/L 04/16/2018, 11:15 AM

## 2018-04-16 NOTE — Discharge Instructions (Addendum)
Meagan Flores,  In the hospital for work-up for possible stroke.  Neurology has evaluated you and believes he had a transient ischemic attack.  You have been started on aspirin and Plavix.  He will be on the Plavix for 3 weeks and he will be on aspirin indefinitely.  Please follow-up with a neurologist as an outpatient.  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  These are blood vessels that carry blood from your heart to your lungs anterior body.  I have placed a recommendation for you to follow-up with cardiothoracic surgeon to monitor the progression of these blood vessels.  Why you are here,  it was noticed that he had some dry spots on your skin.  This appears to be eczema.  I prescribed you a high potency steroid to help treat this which appears to be helping.  Please follow-up with your primary care physician about this.

## 2018-04-16 NOTE — Discharge Summary (Signed)
Physician Discharge Summary  Meagan Flores CXK:[id number] DOB: June 06, 1966 DOA: 04/14/2018  PCP: Alfonse Spruce, FNP  Admit date: 04/14/2018 Discharge date: 04/16/2018  Admitted From: Home Disposition: Home  Recommendations for Outpatient Follow-up:  1. Follow up with PCP in 1 week 2. Follow up with neurology for TIA 3. Follow up with cardiothoracic surgery for dilated pulmonary and ascending aorta arteries 4. Please obtain BMP/CBC in one week 5. Please follow up on the following pending results: None  Home Health: PT Equipment/Devices: None  Discharge Condition: Stable CODE STATUS: Full code Diet recommendation: Heart healthy   Brief/Interim Summary: Your  Admission HPI written by Rise Patience, MD   Chief Complaint: Left-sided weakness.  HPI: Meagan Flores is a 52 y.o. female with history of thyroidectomy with Synthroid replacement, tobacco abuse started experiencing left upper and lower extremity numbness weakness along with the facial numbness on the left side around 10:30 PM last evening at home.  Patient's weakness resolved within 10 minutes but the numbness persisted and decided to come to the ER.  Patient had initially some difficulty walking due to the weakness.  Denies any difficulty speaking swallowing or visual symptoms.  Denies any headache.  Patient was with her friends and has had some beer with the episode happened.  ED Course: In the ER patient appears nonfocal but still complains of numbness in the left side of the upper extremity and face.  CT head was unremarkable neurologist on-call was consulted patient admitted for further management of possible TIA versus stroke.  Patient passed swallow.  Patient states he only drinks very rarely.    Hospital course:  TIA Initial concern for stroke.  MRI was negative for acute CVA.  CT angiogram of the head and neck were negative for carotid, vertebral, and significant intracranial stenosis.  LDL is  elevated at 130 with a mildly elevated hemoglobin A1c of 5.9%.  Physical therapy recommended home health physical therapy.  Neurology recommends aspirin and Plavix for 3 weeks followed by aspirin alone.  Patient is to follow-up with neurology as an outpatient.  Dilated ascending aorta and pulmonary artery Incidentally found on CT angiogram.  Main pulmonary artery dilated to 4.4 cm and ascending aorta dilated to 3.8 cm.  Follow-up information given for patient to follow-up with cardio thoracic surgery as an outpatient.  Tobacco abuse Patient counseled on cessation.  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  Responded to clobetasol cream.  Precautions given to patient since this is a high potency treatment.  Ethanol use Patient counseled on drinking no more than 1 alcoholic beverage per day.  Overweight Body mass index is 29.13 kg/m.   Hypothyroidism S/p thyroidectomy. Continued Synthroid.  Discharge Diagnoses:  Principal Problem:   TIA (transient ischemic attack) Active Problems:   Thyroid disease   Cerebrovascular accident (CVA) due to thrombosis of right posterior cerebral artery ([place])   Mixed hyperlipidemia   Essential hypertension   Smoker    Discharge Instructions  Discharge Instructions    Diet - low sodium heart healthy   Complete by:  As directed    Increase activity slowly   Complete by:  As directed      Allergies as of 04/16/2018      Reactions   Sulfa Antibiotics Hives      Medication List    STOP taking these medications   triamcinolone cream 0.1 % Commonly known as:  KENALOG     TAKE these medications   aspirin 81 MG tablet Take  1 tablet (81 mg total) by mouth daily.   atorvastatin 40 MG tablet Commonly known as:  LIPITOR Take 1 tablet (40 mg total) by mouth daily at 6 PM.   clobetasol ointment 0.05 % Commonly known as:  TEMOVATE Apply topically 2 (two) times daily. Use for up to 2 weeks or until rash improved.   clopidogrel 75 MG  tablet Commonly known as:  PLAVIX Take 1 tablet (75 mg total) by mouth daily for 21 days.   levothyroxine 125 MCG tablet Commonly known as:  SYNTHROID, LEVOTHROID Take 1 tablet (125 mcg total) by mouth daily before breakfast.            Durable Medical Equipment  (From admission, onward)        Start     Ordered   04/16/18 1335  For home use only DME Walker rolling  Once    Question:  Patient needs a walker to treat with the following condition  Answer:  TIA (transient ischemic attack)   04/16/18 1335     Follow-up Information    Alfonse Spruce, FNP. Schedule an appointment as soon as possible for a visit in 1 week(s).   Specialty:  Family Medicine Contact information: [place] [address] [phone]        Rosalin Hawking, MD Follow up.   Specialty:  Neurology Why:  TIA Contact information: [address] [place] [phone] [phone]        Melrose Nakayama, MD. Schedule an appointment as soon as possible for a visit in 4 day(s).   Specialty:  Cardiothoracic Surgery Why:  Dilated pulmonary and aorta Contact information: [address] [place] [address] [phone]          Allergies  Allergen Reactions  . Sulfa Antibiotics Hives    Consultations:  Neurology   Procedures/Studies: Ct Angio Head W Or Wo Contrast  Result Date: 04/15/2018 CLINICAL DATA:  Stroke follow-up EXAM: CT ANGIOGRAPHY HEAD AND NECK TECHNIQUE: Multidetector CT imaging of the head and neck was performed using the standard protocol during bolus administration of intravenous contrast. Multiplanar CT image reconstructions and MIPs were obtained to evaluate the vascular anatomy. Carotid stenosis measurements (when applicable) are obtained utilizing NASCET criteria, using the distal internal carotid diameter as the denominator. CONTRAST:  69mL ISOVUE-370 IOPAMIDOL (ISOVUE-370) INJECTION 76% COMPARISON:  CT head  04/15/2018 FINDINGS: CTA NECK FINDINGS Aortic arch: Ascending aorta 3.8 cm. 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 Surgical clips in the anterior mediastinum. Proximal great vessels widely patent. Right carotid system: Minimal atherosclerotic disease at the right bifurcation without significant stenosis. Left carotid system: Normal left carotid without stenosis or calcification Vertebral arteries: Both vertebral arteries widely patent. Skeleton: Mild degenerative changes cervical spine. No acute skeletal abnormality. Other neck: Negative for mass or adenopathy Upper chest: Lung apices clear. Median sternotomy with surgical clips in the anterior mediastinum. Review of the MIP images confirms the above findings CTA HEAD FINDINGS Anterior circulation: Cavernous carotid widely patent bilaterally without stenosis. Anterior and middle cerebral arteries normal bilaterally. Posterior circulation: Both vertebral arteries patent to the basilar. Basilar normal. PICA, superior cerebellar, and posterior cerebral arteries patent bilaterally. Fetal origin of the right posterior cerebral artery. Venous sinuses: Patent Anatomic variants: None Delayed phase: Normal enhancement postcontrast administration. No acute intracranial abnormality. Review of the MIP images confirms the above findings IMPRESSION: 1. Negative for carotid or vertebral stenosis in the neck. No significant intracranial stenosis. 2. Dilated main  pulmonary artery 4.4 cm.  Ascending aorta 3.8 cm. Electronically Signed   By: Franchot Gallo M.D.   On: 04/15/2018 18:58   Dg Chest 2 View  Result Date: 04/15/2018 CLINICAL DATA:  Acute onset of generalized weakness and shortness of breath. EXAM: CHEST - 2 VIEW COMPARISON:  Chest radiograph performed 01/22/2016 FINDINGS: The lungs are well-aerated and clear. There is no evidence of focal opacification, pleural effusion or pneumothorax. The heart is borderline normal in size. Postoperative change is noted at the superior  mediastinum. No acute osseous abnormalities are seen. IMPRESSION: No acute cardiopulmonary process seen. Electronically Signed   By: Garald Balding M.D.   On: 04/15/2018 01:48   Ct Head Wo Contrast  Result Date: 04/15/2018 CLINICAL DATA:  Acute onset of generalized weakness. EXAM: CT HEAD WITHOUT CONTRAST TECHNIQUE: Contiguous axial images were obtained from the base of the skull through the vertex without intravenous contrast. COMPARISON:  None. FINDINGS: Brain: No evidence of acute infarction, hemorrhage, hydrocephalus, extra-axial collection or mass lesion/mass effect. The posterior fossa, including the cerebellum, brainstem and fourth ventricle, is within normal limits. The third and lateral ventricles, and basal ganglia are unremarkable in appearance. The cerebral hemispheres are symmetric in appearance, with normal gray-white differentiation. No mass effect or midline shift is seen. Vascular: No hyperdense vessel or unexpected calcification. Skull: There is no evidence of fracture; visualized osseous structures are unremarkable in appearance. Sinuses/Orbits: The visualized portions of the orbits are within normal limits. The paranasal sinuses and mastoid air cells are well-aerated. Other: No significant soft tissue abnormalities are seen. IMPRESSION: Unremarkable noncontrast CT of the head. Electronically Signed   By: Garald Balding M.D.   On: 04/15/2018 01:49   Ct Angio Neck W Or Wo Contrast  Result Date: 04/15/2018 CLINICAL DATA:  Stroke follow-up EXAM: CT ANGIOGRAPHY HEAD AND NECK TECHNIQUE: Multidetector CT imaging of the head and neck was performed using the standard protocol during bolus administration of intravenous contrast. Multiplanar CT image reconstructions and MIPs were obtained to evaluate the vascular anatomy. Carotid stenosis measurements (when applicable) are obtained utilizing NASCET criteria, using the distal internal carotid diameter as the denominator. CONTRAST:  81mL ISOVUE-370  IOPAMIDOL (ISOVUE-370) INJECTION 76% COMPARISON:  CT head 04/15/2018 FINDINGS: CTA NECK FINDINGS Aortic arch: Ascending aorta 3.8 cm. 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 Surgical clips in the anterior mediastinum. Proximal great vessels widely patent. Right carotid system: Minimal atherosclerotic disease at the right bifurcation without significant stenosis. Left carotid system: Normal left carotid without stenosis or calcification Vertebral arteries: Both vertebral arteries widely patent. Skeleton: Mild degenerative changes cervical spine. No acute skeletal abnormality. Other neck: Negative for mass or adenopathy Upper chest: Lung apices clear. Median sternotomy with surgical clips in the anterior mediastinum. Review of the MIP images confirms the above findings CTA HEAD FINDINGS Anterior circulation: Cavernous carotid widely patent bilaterally without stenosis. Anterior and middle cerebral arteries normal bilaterally. Posterior circulation: Both vertebral arteries patent to the basilar. Basilar normal. PICA, superior cerebellar, and posterior cerebral arteries patent bilaterally. Fetal origin of the right posterior cerebral artery. Venous sinuses: Patent Anatomic variants: None Delayed phase: Normal enhancement postcontrast administration. No acute intracranial abnormality. Review of the MIP images confirms the above findings IMPRESSION: 1. Negative for carotid or vertebral stenosis in the neck. No significant intracranial stenosis. 2. 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  Ascending aorta 3.8 cm. Electronically Signed   By: Franchot Gallo M.D.   On: 04/15/2018 18:58   Mr Brain Wo Contrast  Result Date:  04/16/2018 CLINICAL DATA:  Initial evaluation for acute left-sided weakness. EXAM: MRI HEAD WITHOUT CONTRAST TECHNIQUE: Multiplanar, multiecho pulse sequences of the brain and surrounding structures were obtained without intravenous contrast. COMPARISON:  Prior CT and CTA from 04/15/2018 FINDINGS: Brain:  Cerebral volume within normal limits. No significant cerebral white matter changes. No focal parenchymal signal abnormality identified. No abnormal foci of restricted diffusion to suggest acute or subacute ischemia. Gray-white matter differentiation maintained. No encephalomalacia to suggest chronic infarction. No foci of susceptibility artifact to suggest acute or chronic intracranial hemorrhage. No mass lesion, midline shift or mass effect. No hydrocephalus. No extra-axial fluid collection. Major dural sinuses are grossly patent. Pituitary gland suprasellar region normal. Midline structures intact and normal. Vascular: Major intracranial vascular flow voids are well maintained. Skull and upper cervical spine: Craniocervical junction within normal limits. Degenerative spondylolysis noted at C3-4 and C4-5 without significant stenosis. Remainder the visualized upper cervical spine within normal limits. Bone marrow signal intensity normal. No scalp soft tissue abnormality. Sinuses/Orbits: Globes and orbital soft tissues within normal limits. Remote posttraumatic defect noted at the right lamina for pre shift. Scattered mucosal thickening throughout the ethmoidal air cells, sphenoid sinuses, and maxillary sinuses. No air-fluid level to suggest acute sinusitis. No mastoid effusion. Inner ear structures normal. Other: None. IMPRESSION: Normal brain MRI.  No acute intracranial abnormality identified. Electronically Signed   By: Jeannine Boga M.D.   On: 04/16/2018 01:35    Transthoracic Echocardiogram (5/19)  Study Conclusions  - Left ventricle: The cavity size was normal. Wall thickness was   normal. Systolic function was normal. The estimated ejection   fraction was in the range of 60% to 65%. Wall motion was normal;   there were no regional wall motion abnormalities. Left   ventricular diastolic function parameters were normal.  Impressions:  - Normal LV function; mild  TR.   Subjective: Patient reports no issues overnight.  Discharge Exam: Vitals:   04/16/18 1210 04/16/18 1649  BP: 103/64 132/80  Pulse: 68 (!) 59  Resp: 17 15  Temp:  98.5 F (36.9 C)  SpO2: 97% 95%   Vitals:   04/16/18 0500 04/16/18 0807 04/16/18 1210 04/16/18 1649  BP: 125/90 131/76 103/64 132/80  Pulse: (!) 56 (!) 54 68 (!) 59  Resp: 20 16 17 15   Temp: 98 F (36.7 C)   98.5 F (36.9 C)  TempSrc: Oral   Oral  SpO2: 99% 98% 97% 95%  Weight:      Height:        General: Pt is alert, awake, not in acute distress Cardiovascular: RRR, S1/S2 +, no rubs, no gallops Respiratory: CTA bilaterally, no wheezing, no rhonchi Abdominal: Soft, NT, ND, bowel sounds + Extremities: no edema, no cyanosis Neuro: no weakness    The results of significant diagnostics from this hospitalization (including imaging, microbiology, ancillary and laboratory) are listed below for reference.     Microbiology: No results found for this or any previous visit (from the past 240 hour(s)).   Labs: BNP (last 3 results) No results for input(s): BNP in the last 8760 hours. Basic Metabolic Panel: Recent Labs  Lab 04/15/18 0058 04/15/18 0106  NA 142 143  K 3.8 3.8  CL 108 106  CO2 24  --   GLUCOSE 95 96  BUN 6 7  CREATININE 0.61 0.50  CALCIUM 8.7*  --    Liver Function Tests: Recent Labs  Lab 04/15/18 0058  AST 15  ALT 11*  ALKPHOS 78  BILITOT 0.3  PROT 7.3  ALBUMIN 3.9   No results for input(s): LIPASE, AMYLASE in the last 168 hours. No results for input(s): AMMONIA in the last 168 hours. CBC: Recent Labs  Lab 04/15/18 0058 04/15/18 0106  WBC 7.6  --   NEUTROABS 4.0  --   HGB 12.7 13.6  HCT 40.3 40.0  MCV 73.7*  --   PLT 238  --    Cardiac Enzymes: No results for input(s): CKTOTAL, CKMB, CKMBINDEX, TROPONINI in the last 168 hours. BNP: Invalid input(s): POCBNP CBG: Recent Labs  Lab 04/15/18 2017 04/16/18 0051 04/16/18 0538  GLUCAP 134* 108* 89    D-Dimer No results for input(s): DDIMER in the last 72 hours. Hgb A1c Recent Labs    04/15/18 0058  HGBA1C 5.9*   Lipid Profile Recent Labs    04/15/18 0058  CHOL 203*  HDL 45  LDLCALC 130*  TRIG 140  CHOLHDL 4.5   Thyroid function studies No results for input(s): TSH, T4TOTAL, T3FREE, THYROIDAB in the last 72 hours.  Invalid input(s): FREET3 Anemia work up No results for input(s): VITAMINB12, FOLATE, FERRITIN, TIBC, IRON, RETICCTPCT in the last 72 hours. Urinalysis    Component Value Date/Time   COLORURINE COLORLESS (A) 04/15/2018 0018   APPEARANCEUR CLEAR 04/15/2018 0018   LABSPEC 1.001 (L) 04/15/2018 0018   PHURINE 6.0 04/15/2018 0018   GLUCOSEU NEGATIVE 04/15/2018 0018   HGBUR SMALL (A) 04/15/2018 0018   BILIRUBINUR NEGATIVE 04/15/2018 0018   KETONESUR NEGATIVE 04/15/2018 0018   PROTEINUR NEGATIVE 04/15/2018 0018   UROBILINOGEN 0.2 07/04/2013 0950   NITRITE NEGATIVE 04/15/2018 0018   LEUKOCYTESUR NEGATIVE 04/15/2018 0018    SIGNED:   Cordelia Poche, MD [facility] 04/16/2018, 4:51 PM

## 2018-04-16 NOTE — Progress Notes (Signed)
  Echocardiogram 2D Echocardiogram has been performed.  Madelaine Etienne 04/16/2018, 12:09 PM

## 2018-04-16 NOTE — Care Management Note (Signed)
Case Management Note  Patient Details  Name: Meagan Flores MRN: [id number] Date of Birth: 09-27-1966  Subjective/Objective:    Pt presents for TIA.  Pt from home with husband who works during the day and daughter who works at [employer] someone is available to her at all times.  Pt was independent and working PTA.  Pt would like to return to work ASAP.         Action/Plan: Pt agrees to RW and [facility] PT.  Pt without preference of [place] agency and agreeable to [facility]. Jermaine with [facility] accepted referral and will have RW delivered to room today.  Expected Discharge Date:                  Expected Discharge Plan:  [place]  In-House Referral:  NA  Discharge planning Services  CM Consult  Post Acute Care Choice:  Durable Medical Equipment, Home Health Choice offered to:  Patient  DME Arranged:  Walker rolling DME Agency:  [place] Arranged:  PT [facility] Agency:  [place]  Status of Service:  Completed, signed off  If discussed at [place] of Stay Meetings, dates discussed:    Additional Comments:  Claudie Leach, RN 04/16/2018, 1:37 PM

## 2018-04-16 NOTE — Progress Notes (Signed)
STROKE TEAM PROGRESS NOTE   SUBJECTIVE (INTERVAL HISTORY) Her daughter is at the bedside.  Pt left sided numbness tingling resolved. Nearly baseline now. She feels tired. MRI and CTA head and neck negative. She is educated on smoking cessation.    OBJECTIVE Temp:  [98 F (36.7 C)-98.2 F (36.8 C)] 98 F (36.7 C) (05/19 0500) Pulse Rate:  [54-68] 68 (05/19 1210) Cardiac Rhythm: Normal sinus rhythm (05/19 0800) Resp:  [16-20] 17 (05/19 1210) BP: (103-131)/(64-93) 103/64 (05/19 1210) SpO2:  [97 %-100 %] 97 % (05/19 1210)  CBC:  Recent Labs  Lab 04/15/18 0058 04/15/18 0106  WBC 7.6  --   NEUTROABS 4.0  --   HGB 12.7 13.6  HCT 40.3 40.0  MCV 73.7*  --   PLT 238  --     Basic Metabolic Panel:  Recent Labs  Lab 04/15/18 0058 04/15/18 0106  NA 142 143  K 3.8 3.8  CL 108 106  CO2 24  --   GLUCOSE 95 96  BUN 6 7  CREATININE 0.61 0.50  CALCIUM 8.7*  --     Lipid Panel:     Component Value Date/Time   CHOL 203 (H) 04/15/2018 0058   CHOL 214 (H) 08/05/2017 1645   TRIG 140 04/15/2018 0058   HDL 45 04/15/2018 0058   HDL 51 08/05/2017 1645   CHOLHDL 4.5 04/15/2018 0058   VLDL 28 04/15/2018 0058   LDLCALC 130 (H) 04/15/2018 0058   LDLCALC 145 (H) 08/05/2017 1645   HgbA1c:  Lab Results  Component Value Date   HGBA1C 5.9 (H) 04/15/2018   Urine Drug Screen:     Component Value Date/Time   LABOPIA NONE DETECTED 04/15/2018 0018   COCAINSCRNUR NONE DETECTED 04/15/2018 0018   LABBENZ NONE DETECTED 04/15/2018 0018   AMPHETMU NONE DETECTED 04/15/2018 0018   THCU NONE DETECTED 04/15/2018 0018   LABBARB NONE DETECTED 04/15/2018 0018    Alcohol Level     Component Value Date/Time   ETH 11 (H) 04/15/2018 0058    IMAGING I have personally reviewed the radiological images below and agree with the radiology interpretations.  Ct Head Wo Contrast 04/15/2018 IMPRESSION:  Unremarkable noncontrast CT of the head.   MR Brain Wo Contrast  04/16/2018 IMPRESSION: Normal  brain MRI.  No acute intracranial abnormality identified.  CTA Head and Neck 04/15/2018 IMPRESSION: 1. Negative for carotid or vertebral stenosis in the neck. No significant intracranial stenosis. 2. 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  Ascending aorta 3.8 cm.   Transthoracic Echocardiogram  04/16/2018 Study Conclusions - Left ventricle: The cavity size was normal. Wall thickness was   normal. Systolic function was normal. The estimated ejection   fraction was in the range of 60% to 65%. Wall motion was normal;   there were no regional wall motion abnormalities. Left   ventricular diastolic function parameters were normal. Impressions: - Normal LV function; mild TR.   PHYSICAL EXAM  Temp:  [98 F (36.7 C)-98.2 F (36.8 C)] 98 F (36.7 C) (05/19 0500) Pulse Rate:  [54-68] 68 (05/19 1210) Resp:  [16-20] 17 (05/19 1210) BP: (103-131)/(64-93) 103/64 (05/19 1210) SpO2:  [97 %-100 %] 97 % (05/19 1210)  General - Well nourished, well developed, in no apparent distress, mildly lethargic.  Ophthalmologic - Fundi not visualized due to noncooperation.  Cardiovascular - Regular rate and rhythm  Mental Status -  Level of arousal and orientation to time, place, and person were intact. Language including expression, naming, repetition, comprehension was  assessed and found intact. Fund of Knowledge was assessed and was intact.  Cranial Nerves II - XII - II - Visual field intact OU. III, IV, VI - Extraocular movements intact. V - facial sensation symmetric. VII - Facial movement intact bilaterally. VIII - Hearing & vestibular intact bilaterally. X - Palate elevates symmetrically. XI - Chin turning & shoulder shrug intact bilaterally. XII - Tongue protrusion intact.  Motor Strength - The patient's strength was normal in all extremities and pronator drift was absent.  Bulk was normal and fasciculations were absent.   Motor Tone - Muscle tone was assessed at the neck and appendages  and was normal.  Reflexes - The patient's reflexes were 1+ in all extremities and she had no pathological reflexes.  Sensory - Light touch, temperature/pinprick were assessed and symmetric.   Coordination - The patient had normal movements in the hands with no ataxia or dysmetria.  Tremor was absent.  Gait and Station - deferred   ASSESSMENT/PLAN Ms. ARIANIE COUSE is a 52 y.o. female with history of hyperlipidemia, hypothyroidism, and tobacco use presenting with transient left-sided weakness. She did not receive IV t-PA due to mild deficits.  TIA  Resultant  Left sided numbness and tingling resolved  CT head - unremarkable  MRI head - normal  CTA H&N - Dilated main pulmonary artery 4.4 cm.  Ascending aorta 3.8 cm. O/w unremarkable  2D Echo - EF 60 - 65% No cardiac source of emboli identified.  LDL - 130  HgbA1c - 5.9  UDS neg  VTE prophylaxis - Lovenox  No antithrombotic prior to admission, now on aspirin 325 mg daily.  Patient counseled to be compliant with her antithrombotic medications  Ongoing aggressive stroke risk factor management  Therapy recommendations:  HH PT recommended  Disposition:  Pending  Hypertension  Stable  Permissive hypertension (OK if < 220/120) but gradually normalize in 5-7 days . Long-term BP goal normotensive  Hyperlipidemia  Lipid lowering medication PTA:  none  LDL 130, goal < 70  Add Lipitor 40 mg daily  Continue statin at discharge  Tobacco abuse  Current smoker  Smoking cessation counseling provided  Pt is willing to quit  Other Stroke Risk Factors  ETOH use, advised to drink no more than 1 alcoholic beverage per day.  Obesity  Other Active Problems  Elevated TG  Dilated main pulmonary artery 4.4 cm.  Ascending aorta 3.8 cm by CTA. - outpt CVTS follow up arranged.   Hospital day # 0  Neurology will sign off. Please call with questions. Pt will follow up with stroke clinic NP at [facility] in about 4 weeks.  Thanks for the consult.  Rosalin Hawking, MD PhD Stroke Neurology 04/16/2018 5:42 PM    To contact Stroke Continuity provider, please refer to [email or web address] After hours, contact General Neurology

## 2018-04-17 ENCOUNTER — Telehealth: Payer: Self-pay | Admitting: Neurology

## 2018-04-17 LAB — HIV ANTIBODY (ROUTINE TESTING W REFLEX): HIV SCREEN 4TH GENERATION: NONREACTIVE

## 2018-04-17 NOTE — Telephone Encounter (Signed)
Claiborne Billings with [facility] requesting VO for home PT for 2x4 post stroke. Claiborne Billings can be reached at [phone]

## 2018-04-18 NOTE — Telephone Encounter (Signed)
I returned call to [facility] with [facility] and left her a message confirming VO for PT. I advised her to call us back if she has any additional questions.

## 2018-04-25 ENCOUNTER — Telehealth: Payer: Self-pay | Admitting: Neurology

## 2018-04-25 NOTE — Telephone Encounter (Signed)
Pt has called to confirm a fax was received from [facility].  Pt has not been seen here and has been advised the fax should have been sent to the hospital.  Pt states she is to see her PCP on tomorrow and may request PCP fill form out for her.  Pt made aware this message would be sent to medical records specialist to send form to hospital.

## 2018-04-26 ENCOUNTER — Ambulatory Visit: Payer: BLUE CROSS/BLUE SHIELD | Attending: Family Medicine | Admitting: Physician Assistant

## 2018-04-26 VITALS — BP 119/78 | HR 70 | Temp 98.3°F | Resp 16 | Ht 68.0 in | Wt 197.6 lb

## 2018-04-26 DIAGNOSIS — Z882 Allergy status to sulfonamides status: Secondary | ICD-10-CM | POA: Insufficient documentation

## 2018-04-26 DIAGNOSIS — Z09 Encounter for follow-up examination after completed treatment for conditions other than malignant neoplasm: Secondary | ICD-10-CM | POA: Diagnosis not present

## 2018-04-26 DIAGNOSIS — E890 Postprocedural hypothyroidism: Secondary | ICD-10-CM | POA: Insufficient documentation

## 2018-04-26 DIAGNOSIS — I10 Essential (primary) hypertension: Secondary | ICD-10-CM | POA: Diagnosis not present

## 2018-04-26 DIAGNOSIS — Z7982 Long term (current) use of aspirin: Secondary | ICD-10-CM | POA: Insufficient documentation

## 2018-04-26 DIAGNOSIS — R739 Hyperglycemia, unspecified: Secondary | ICD-10-CM

## 2018-04-26 DIAGNOSIS — Z79899 Other long term (current) drug therapy: Secondary | ICD-10-CM | POA: Insufficient documentation

## 2018-04-26 DIAGNOSIS — Z7901 Long term (current) use of anticoagulants: Secondary | ICD-10-CM | POA: Diagnosis not present

## 2018-04-26 DIAGNOSIS — I729 Aneurysm of unspecified site: Secondary | ICD-10-CM

## 2018-04-26 DIAGNOSIS — Z79890 Hormone replacement therapy (postmenopausal): Secondary | ICD-10-CM | POA: Insufficient documentation

## 2018-04-26 DIAGNOSIS — Z9889 Other specified postprocedural states: Secondary | ICD-10-CM | POA: Diagnosis not present

## 2018-04-26 DIAGNOSIS — E782 Mixed hyperlipidemia: Secondary | ICD-10-CM | POA: Insufficient documentation

## 2018-04-26 DIAGNOSIS — G459 Transient cerebral ischemic attack, unspecified: Secondary | ICD-10-CM | POA: Diagnosis present

## 2018-04-26 NOTE — Progress Notes (Signed)
Patient ID: Meagan Flores, female   DOB: 07/14/1966, 52 y.o.   MRN: [id number]    Meagan Flores, is a 52 y.o. female  ASN:[id number]  LPF:[id number]  DOB - [birth year]  Subjective:  Chief Complaint and HPI: Meagan Flores is a 52 y.o. female here today to for a follow up visit After being hospitalized 5/17-5/19/2019 for TIA.  Home health coming 2 days per week.  Hospital advised her being out of work until after she has been cleared by neurology.  Her appt is not until 06/13/2018.  She was offered an appt sooner than this with neurology but did not want to see a nurse practitioner.  Also, I offered to try and call to get neurology appt moved until sooner but she did not want me to do this.  She expresses frustration with me that I am unable to fill out her paperwork although her hospital paper work is clear that she needs to be cleared by neurology.    She is feeling better but still c/o L sided weakness in her L arm and L leg.    She has put a call in to [facility] and is awaiting a call back.     After being hospitalized 5/17-5/19/2019 Chief Complaint:Left-sided weakness.  ZHG:DJME Meagan Flores a 52 y.o.femalewithhistory of thyroidectomy with Synthroid replacement, tobacco abuse started experiencing left upper and lower extremity numbness weakness along with the facial numbness on the left side around 10:30 PM last evening at home. Patient's weakness resolved within 10 minutes but the numbness persisted and decided to come to the ER. Patient had initially some difficulty walking due to the weakness. Denies any difficulty speaking swallowing or visual symptoms. Denies any headache. Patient was with her friends and has had some beer with the episode happened.  ED Course:In the ER patient appears nonfocal but still complains of numbness in the left side of the upper extremity and face. CT head was unremarkable neurologist on-call was consulted patient admitted for  further management of possible TIA versus stroke. Patient passed swallow. Patient states he only drinks very rarely.   Hospital course:  TIA Initial concern for stroke.  MRI was negative for acute CVA.  CT angiogram of the head and neck were negative for carotid, vertebral, and significant intracranial stenosis.  LDL is elevated at 130 with a mildly elevated hemoglobin A1c of 5.9%.  Physical therapy recommended home health physical therapy.  Neurology recommends aspirin and Plavix for 3 weeks followed by aspirin alone.  Patient is to follow-up with neurology as an outpatient.  Dilated ascending aorta and pulmonary artery Incidentally found on CT angiogram.  Main pulmonary artery dilated to 4.4 cm and ascending aorta dilated to 3.8 cm.  Follow-up information given for patient to follow-up with cardio thoracic surgery as an outpatient.  Tobacco abuse Patient counseled on cessation.  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  Responded to clobetasol cream.  Precautions given to patient since this is a high potency treatment.  Ethanol use Patient counseled on drinking no more than 1 alcoholic beverage per day.  Overweight Body mass index is 29.13 kg/m.   Hypothyroidism S/p thyroidectomy. Continued Synthroid.  Discharge Diagnoses:  Principal Problem:   TIA (transient ischemic attack) Active Problems:   Thyroid disease   Cerebrovascular accident (CVA) due to thrombosis of right posterior cerebral artery ([place])   Mixed hyperlipidemia   Essential hypertension   Smoker     Recommendations for Outpatient Follow-up:  1. Follow up with PCP in 1  week 2. Follow up with neurology for TIA 3. Follow up with cardiothoracic surgery for dilated pulmonary and ascending aorta arteries 4. Please obtain BMP/CBC in one week Please follow up on the following pending results: None  ED/Hospital notes reviewed.   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 EENT:  No vision changes, No blurry vision, No hearing changes. No mouth, throat, or ear problems.  Respiratory: No cough, No SOB Cardiac: No CP, no palpitations GI:  No abd pain, No N/V/D. GU: No Urinary s/sx Musculoskeletal: No joint pain Neuro: No headache, no dizziness, Skin: No rash Endocrine:  No polydipsia. No polyuria.  Psych: Denies SI/HI  No problems updated.  ALLERGIES: Allergies  Allergen Reactions  . Sulfa Antibiotics Hives    PAST MEDICAL HISTORY: Past Medical History:  Diagnosis Date  . Essential hypertension   . Hyperlipidemia   . Hypothyroidism   . Thyroid disease     MEDICATIONS AT HOME: Prior to Admission medications   Medication Sig Start Date End Date Taking? Authorizing Provider  aspirin 81 MG tablet Take 1 tablet (81 mg total) by mouth daily. 04/16/18  Yes Mariel Aloe, MD  atorvastatin (LIPITOR) 40 MG tablet Take 1 tablet (40 mg total) by mouth daily at 6 PM. 04/16/18  Yes Mariel Aloe, MD  clobetasol ointment (TEMOVATE) 0.05 % Apply topically 2 (two) times daily. Use for up to 2 weeks or until rash improved. 04/16/18  Yes Mariel Aloe, MD  clopidogrel (PLAVIX) 75 MG tablet Take 1 tablet (75 mg total) by mouth daily for 21 days. 04/16/18 05/07/18 Yes Mariel Aloe, MD  levothyroxine (SYNTHROID, LEVOTHROID) 125 MCG tablet Take 1 tablet (125 mcg total) by mouth daily before breakfast. 07/20/17  Yes Hairston, Toy Baker R, FNP     Objective:  EXAM:   Vitals:   04/26/18 0924  BP: 119/78  Pulse: 70  Resp: 16  Temp: 98.3 F (36.8 C)  TempSrc: Oral  SpO2: 97%  Weight: 197 lb 9.6 oz (89.6 kg)  Height: 5\' 8"  (1.727 m)    General appearance : A&OX3. NAD. Non-toxic-appearing HEENT: Atraumatic and Normocephalic.  PERRLA. EOM intact.   Neck: supple, no JVD. No cervical lymphadenopathy. No thyromegaly Chest/Lungs:  Breathing-non-labored, Good air entry bilaterally, breath sounds normal without  rales, rhonchi, or wheezing  CVS: S1 S2 regular, no murmurs, gallops, rubs  Abdomen: Bowel sounds present, Non tender and not distended with no gaurding, rigidity or rebound. Extremities: Bilateral Lower Ext shows no edema, both legs are warm to touch with = pulse throughout Neurology:  CN II-XII grossly intact, Non focal.  Facial symmetry in tact w/o weakness.  Extremity are almost = in strength.  L may be 10% less than R.  Walking with a walker Psych:  TP linear. J/I WNL. Normal speech. Appropriate eye contact and affect.  Skin:  No Rash  Data Review Lab Results  Component Value Date   HGBA1C 5.9 (H) 04/15/2018     Assessment & Plan   1. TIA (transient ischemic attack) - CBC with Differential/Platelet - Basic metabolic panel  2. Hospital discharge follow-up Improving.  She is awaiting a f/up call for the aneurysm with cardiothoracic surgery.  She refused expediting the neurology appt.    3. Hyperglycemia I have had a lengthy discussion and provided education about insulin resistance and the intake of too much sugar/refined carbohydrates.  I have advised  the patient to work at a goal of eliminating sugary drinks, candy, desserts, sweets, refined sugars, processed foods, and white carbohydrates.  The patient expresses understanding.   Patient have been counseled extensively about nutrition and exercise  Return in about 2 weeks (around 05/10/2018) for assign new PCP; FMLA paperwork.  The patient was given clear instructions to go to ER or return to medical center if symptoms don't improve, worsen or new problems develop. The patient verbalized understanding. The patient was told to call to get lab results if they haven't heard anything in the next week.     Freeman Caldron, PA-C [facility], [place]   04/26/2018, 9:48 AM

## 2018-04-26 NOTE — Patient Instructions (Signed)
Make appt with [facility] cardiothoracic surgery as indicated on your hospital discharge instructions

## 2018-04-27 LAB — CBC WITH DIFFERENTIAL/PLATELET
Basophils Absolute: 0 10*3/uL (ref 0.0–0.2)
Basos: 0 %
EOS (ABSOLUTE): 0.1 10*3/uL (ref 0.0–0.4)
Eos: 1 %
Hematocrit: 41.2 % (ref 34.0–46.6)
Hemoglobin: 13.1 g/dL (ref 11.1–15.9)
IMMATURE GRANULOCYTES: 0 %
Immature Grans (Abs): 0 10*3/uL (ref 0.0–0.1)
Lymphocytes Absolute: 2.7 10*3/uL (ref 0.7–3.1)
Lymphs: 47 %
MCH: 24.2 pg — ABNORMAL LOW (ref 26.6–33.0)
MCHC: 31.8 g/dL (ref 31.5–35.7)
MCV: 76 fL — AB (ref 79–97)
MONOS ABS: 0.4 10*3/uL (ref 0.1–0.9)
Monocytes: 6 %
NEUTROS PCT: 46 %
Neutrophils Absolute: 2.7 10*3/uL (ref 1.4–7.0)
PLATELETS: 253 10*3/uL (ref 150–450)
RBC: 5.42 x10E6/uL — AB (ref 3.77–5.28)
RDW: 16 % — AB (ref 12.3–15.4)
WBC: 5.9 10*3/uL (ref 3.4–10.8)

## 2018-04-27 LAB — BASIC METABOLIC PANEL
BUN/Creatinine Ratio: 11 (ref 9–23)
BUN: 8 mg/dL (ref 6–24)
CALCIUM: 9.3 mg/dL (ref 8.7–10.2)
CHLORIDE: 101 mmol/L (ref 96–106)
CO2: 26 mmol/L (ref 20–29)
Creatinine, Ser: 0.76 mg/dL (ref 0.57–1.00)
GFR calc non Af Amer: 90 mL/min/{1.73_m2} (ref >59)
GFR, EST AFRICAN AMERICAN: 104 mL/min/{1.73_m2} (ref >59)
Glucose: 122 mg/dL — ABNORMAL HIGH (ref 65–99)
POTASSIUM: 4 mmol/L (ref 3.5–5.2)
SODIUM: 144 mmol/L (ref 134–144)

## 2018-04-28 ENCOUNTER — Telehealth: Payer: Self-pay

## 2018-04-28 NOTE — Telephone Encounter (Signed)
CMA spoke to patient to inform on lab results.  Patient understood. Patient verified DOB.  

## 2018-04-28 NOTE — Telephone Encounter (Signed)
-----   Message from [place] Donovan, [place] sent at 04/27/2018  1:43 PM EDT ----- Please call patient.  Labs improving.  Follow-up as planned. Thanks, Freeman Caldron, PA-C

## 2018-05-12 ENCOUNTER — Ambulatory Visit: Payer: BLUE CROSS/BLUE SHIELD | Attending: Nurse Practitioner | Admitting: Nurse Practitioner

## 2018-05-12 ENCOUNTER — Encounter: Payer: Self-pay | Admitting: Nurse Practitioner

## 2018-05-12 VITALS — BP 108/73 | HR 84 | Temp 98.3°F | Ht 68.0 in | Wt 203.6 lb

## 2018-05-12 DIAGNOSIS — Z800 Family history of malignant neoplasm of digestive organs: Secondary | ICD-10-CM | POA: Diagnosis not present

## 2018-05-12 DIAGNOSIS — Z79899 Other long term (current) drug therapy: Secondary | ICD-10-CM | POA: Insufficient documentation

## 2018-05-12 DIAGNOSIS — Z882 Allergy status to sulfonamides status: Secondary | ICD-10-CM | POA: Insufficient documentation

## 2018-05-12 DIAGNOSIS — Z7982 Long term (current) use of aspirin: Secondary | ICD-10-CM | POA: Insufficient documentation

## 2018-05-12 DIAGNOSIS — E785 Hyperlipidemia, unspecified: Secondary | ICD-10-CM | POA: Insufficient documentation

## 2018-05-12 DIAGNOSIS — R7303 Prediabetes: Secondary | ICD-10-CM

## 2018-05-12 DIAGNOSIS — R51 Headache: Secondary | ICD-10-CM | POA: Diagnosis not present

## 2018-05-12 DIAGNOSIS — Z90710 Acquired absence of both cervix and uterus: Secondary | ICD-10-CM | POA: Diagnosis not present

## 2018-05-12 DIAGNOSIS — Z8673 Personal history of transient ischemic attack (TIA), and cerebral infarction without residual deficits: Secondary | ICD-10-CM | POA: Diagnosis not present

## 2018-05-12 DIAGNOSIS — R519 Headache, unspecified: Secondary | ICD-10-CM

## 2018-05-12 DIAGNOSIS — G8929 Other chronic pain: Secondary | ICD-10-CM

## 2018-05-12 DIAGNOSIS — Z1231 Encounter for screening mammogram for malignant neoplasm of breast: Secondary | ICD-10-CM

## 2018-05-12 DIAGNOSIS — Z9889 Other specified postprocedural states: Secondary | ICD-10-CM | POA: Diagnosis not present

## 2018-05-12 LAB — GLUCOSE, POCT (MANUAL RESULT ENTRY): POC Glucose: 115 mg/dl — AB (ref 70–99)

## 2018-05-12 MED ORDER — NAPROXEN 500 MG PO TABS: 500.0000 mg | ORAL_TABLET | Freq: Two times a day (BID) | ORAL | 0 refills | Status: DC

## 2018-05-12 MED ORDER — ATORVASTATIN CALCIUM 40 MG PO TABS: 40.0000 mg | ORAL_TABLET | Freq: Every day | ORAL | 1 refills | Status: DC

## 2018-05-12 NOTE — Patient Instructions (Signed)
General Headache Without Cause A headache is pain or discomfort felt around the head or neck area. There are many causes and types of headaches. In some cases, the cause may not be found. Follow these instructions at home: Managing pain  Take over-the-counter and prescription medicines only as told by your doctor.  Lie down in a dark, quiet room when you have a headache.  If directed, apply ice to the head and neck area: ? Put ice in a plastic bag. ? Place a towel between your skin and the bag. ? Leave the ice on for 20 minutes, 2-3 times per day.  Use a heating pad or hot shower to apply heat to the head and neck area as told by your doctor.  Keep lights dim if bright lights bother you or make your headaches worse. Eating and drinking  Eat meals on a regular schedule.  Lessen how much alcohol you drink.  Lessen how much caffeine you drink, or stop drinking caffeine. General instructions  Keep all follow-up visits as told by your doctor. This is important.  Keep a journal to find out if certain things bring on headaches. For example, write down: ? What you eat and drink. ? How much sleep you get. ? Any change to your diet or medicines.  Relax by getting a massage or doing other relaxing activities.  Lessen stress.  Sit up straight. Do not tighten (tense) your muscles.  Do not use tobacco products. This includes cigarettes, chewing tobacco, or e-cigarettes. If you need help quitting, ask your doctor.  Exercise regularly as told by your doctor.  Get enough sleep. This often means 7-9 hours of sleep. Contact a doctor if:  Your symptoms are not helped by medicine.  You have a headache that feels different than the other headaches.  You feel sick to your stomach (nauseous) or you throw up (vomit).  You have a fever. Get help right away if:  Your headache becomes really bad.  You keep throwing up.  You have a stiff neck.  You have trouble seeing.  You have  trouble speaking.  You have pain in the eye or ear.  Your muscles are weak or you lose muscle control.  You lose your balance or have trouble walking.  You feel like you will pass out (faint) or you pass out.  You have confusion. This information is not intended to replace advice given to you by your health care provider. Make sure you discuss any questions you have with your health care provider. Document Released: 08/24/2008 Document Revised: 04/22/2016 Document Reviewed: 03/10/2015 Elsevier Interactive Patient Education  2018 [employer].  Migraine Headache A migraine headache is a very strong throbbing pain on one side or both sides of your head. Migraines can also cause other symptoms. Talk with your doctor about what things may bring on (trigger) your migraine headaches. Follow these instructions at home: Medicines  Take over-the-counter and prescription medicines only as told by your doctor.  Do not drive or use heavy machinery while taking prescription pain medicine.  To prevent or treat constipation while you are taking prescription pain medicine, your doctor may recommend that you: ? Drink enough fluid to keep your pee (urine) clear or pale yellow. ? Take over-the-counter or prescription medicines. ? Eat foods that are high in fiber. These include fresh fruits and vegetables, whole grains, and beans. ? Limit foods that are high in fat and processed sugars. These include fried and sweet foods. Lifestyle  Avoid  alcohol.  Do not use any products that contain nicotine or tobacco, such as cigarettes and e-cigarettes. If you need help quitting, ask your doctor.  Get at least 8 hours of sleep every night.  Limit your stress. General instructions   Keep a journal to find out what may bring on your migraines. For example, write down: ? What you eat and drink. ? How much sleep you get. ? Any change in what you eat or drink. ? Any change in your medicines.  If you have  a migraine: ? Avoid things that make your symptoms worse, such as bright lights. ? It may help to lie down in a dark, quiet room. ? Do not drive or use heavy machinery. ? Ask your doctor what activities are safe for you.  Keep all follow-up visits as told by your doctor. This is important. Contact a doctor if:  You get a migraine that is different or worse than your usual migraines. Get help right away if:  Your migraine gets very bad.  You have a fever.  You have a stiff neck.  You have trouble seeing.  Your muscles feel weak or like you cannot control them.  You start to lose your balance a lot.  You start to have trouble walking.  You pass out (faint). This information is not intended to replace advice given to you by your health care provider. Make sure you discuss any questions you have with your health care provider. Document Released: 08/24/2008 Document Revised: 06/04/2016 Document Reviewed: 05/03/2016 Elsevier Interactive Patient Education  2018 [employer].

## 2018-05-12 NOTE — Progress Notes (Signed)
Assessment & Plan:  Meagan Flores was seen today for establish care.  Diagnoses and all orders for this visit:  Prediabetes Chronic. Stable.  -     Glucose (CBG) Continue blood sugar control as discussed in office today, low carbohydrate diet, and regular physical exercise as tolerated, 150 minutes per week (30 min each day, 5 days per week, or 50 min 3 days per week). Annual eye exams and foot exams are recommended. Lab Results  Component Value Date   HGBA1C 5.9 (H) 04/15/2018    Chronic left-sided headaches -     naproxen (NAPROSYN) 500 MG tablet; Take 1 tablet (500 mg total) by mouth 2 (two) times daily with a meal. -     Ambulatory referral to Ophthalmology  History of TIA (transient ischemic attack) -     atorvastatin (LIPITOR) 40 MG tablet; Take 1 tablet (40 mg total) by mouth daily at 6 PM. INSTRUCTIONS: Work on a low fat, heart healthy diet. No fried foods. No junk foods, sodas, sugary drinks, unhealthy snacking, alcohol or smoking.     Breast cancer screening by mammogram -     MM 3D SCREEN BREAST BILATERAL; Future    Patient has been counseled on age-appropriate routine health concerns for screening and prevention. These are reviewed and up-to-date. Referrals have been placed accordingly. Immunizations are up-to-date or declined.    Subjective:   Chief Complaint  Patient presents with  . Establish Care    Pt. is here to establish care. Pt. stated she get headaches on the left side of her face. Pt. stated she get anxiety.    HPI Meagan Flores 52 y.o. female presents to office today to establish care. She is accompanied by her daughter. They are requesting that FMLA papers be filled out in regards to the TIA. I have deferred this to Neurology. She has an appointment on the 19th of June. She has a history of recent TIA, HTN, Thyroid disease (being followed by endocrinology for this) and most recently was noted during her ED admission for TIA with dilated pulmonary and ascending  aortic arteries (she sees Dr. Roxan Hockey with [facility] on 05-23-2018). She continues to smoke.   Headache Onset one month ago. Occurring intermittently throughout the day.  She has not tried any other medication aside from xs tylenol for her headaches twice a day with no relief of symptoms. She denies any visual disturbances or new neurological deficits. Aggravating factors: NONE. Relieving factors: NONE. Duration of headaches minutes to hours.   TIA 04-14-2018. MRI negative for stroke. She has residual L sided weakness and is using a cane for ambulation.   Thyroid Disorder S/P thyroidectomy. She takes synthroid as prescribed. She is being followed by endocrinology.  Lab Results  Component Value Date   TSH 1.600 07/04/2017   Review of Systems  Constitutional: Negative for fever, malaise/fatigue and weight loss.  HENT: Negative.  Negative for nosebleeds.   Eyes: Negative.  Negative for blurred vision, double vision and photophobia.  Respiratory: Negative.  Negative for cough and shortness of breath.   Cardiovascular: Negative.  Negative for chest pain, palpitations and leg swelling.  Gastrointestinal: Negative.  Negative for heartburn, nausea and vomiting.  Musculoskeletal: Negative.  Negative for myalgias.  Neurological: Positive for weakness and headaches. Negative for dizziness, focal weakness and seizures.  Psychiatric/Behavioral: Negative.  Negative for suicidal ideas.    Past Medical History:  Diagnosis Date  . Hyperlipidemia   . Hypothyroidism   . Prediabetes   . Stroke (  [place])   . Thyroid disease     Past Surgical History:  Procedure Laterality Date  . PARTIAL HYSTERECTOMY    . right arm sx    . SHOULDER ARTHROSCOPY WITH DISTAL CLAVICLE RESECTION Right 01/12/2017   Procedure: RIGHT SHOULDER ARTHROSCOPY WITH  SUBACROMIAL DECOMPRESSION, DISTAL CLAVICLE EXCISION, extensive debridement;  Surgeon: Leandrew Koyanagi, MD;  Location: [facility];  Service: Orthopedics;   Laterality: Right;  . THYROIDECTOMY      Family History  Problem Relation Age of Onset  . Stomach cancer Sister   . Colon polyps Sister   . Esophageal cancer Neg Hx   . Rectal cancer Neg Hx   . Colon cancer Neg Hx     Social History Reviewed with no changes to be made today.   Outpatient Medications Prior to Visit  Medication Sig Dispense Refill  . aspirin 81 MG tablet Take 1 tablet (81 mg total) by mouth daily. 30 tablet 0  . clobetasol ointment (TEMOVATE) 0.05 % Apply topically 2 (two) times daily. Use for up to 2 weeks or until rash improved. 15 g 0  . levothyroxine (SYNTHROID, LEVOTHROID) 125 MCG tablet Take 1 tablet (125 mcg total) by mouth daily before breakfast. 30 tablet 2  . atorvastatin (LIPITOR) 40 MG tablet Take 1 tablet (40 mg total) by mouth daily at 6 PM. 30 tablet 0   No facility-administered medications prior to visit.     Allergies  Allergen Reactions  . Sulfa Antibiotics Hives       Objective:    BP 108/73 (BP Location: Right Arm, Patient Position: Sitting, Cuff Size: Normal)   Pulse 84   Temp 98.3 F (36.8 C) (Oral)   Ht 5\' 8"  (1.727 m)   Wt 203 lb 9.6 oz (92.4 kg)   SpO2 95%   BMI 30.96 kg/m  Wt Readings from Last 3 Encounters:  05/12/18 203 lb 9.6 oz (92.4 kg)  04/26/18 197 lb 9.6 oz (89.6 kg)  04/15/18 191 lb 9.3 oz (86.9 kg)    Physical Exam  Constitutional: She is oriented to person, place, and time. She appears well-developed and well-nourished. She is cooperative.  HENT:  Head: Normocephalic and atraumatic.  Eyes: EOM are normal.  Neck: Normal range of motion.  Cardiovascular: Normal rate, regular rhythm and normal heart sounds. Exam reveals no gallop and no friction rub.  No murmur heard. Pulmonary/Chest: Effort normal and breath sounds normal. No tachypnea. No respiratory distress. She has no decreased breath sounds. She has no wheezes. She has no rhonchi. She has no rales. She exhibits no tenderness.  Abdominal: Bowel sounds are  normal.  Musculoskeletal: Normal range of motion. She exhibits no edema.  Neurological: She is alert and oriented to person, place, and time. Coordination and gait abnormal.  Skin: Skin is warm and dry.  Psychiatric: She has a normal mood and affect. Her behavior is normal. Judgment and thought content normal.  Nursing note and vitals reviewed.      Patient has been counseled extensively about nutrition and exercise as well as the importance of adherence with medications and regular follow-up. The patient was given clear instructions to go to ER or return to medical center if symptoms don't improve, worsen or new problems develop. The patient verbalized understanding.   Follow-up: Return in about 1 month (around 06/09/2018) for headaches.   Gildardo Pounds, FNP-BC [facility] [place], [place]   05/13/2018, 12:30 AM

## 2018-05-13 ENCOUNTER — Encounter: Payer: Self-pay | Admitting: Nurse Practitioner

## 2018-05-17 ENCOUNTER — Encounter: Payer: Self-pay | Admitting: Adult Health

## 2018-05-17 ENCOUNTER — Telehealth: Payer: Self-pay

## 2018-05-17 ENCOUNTER — Ambulatory Visit: Payer: BLUE CROSS/BLUE SHIELD | Admitting: Adult Health

## 2018-05-17 NOTE — Progress Notes (Deleted)
[facility] [address] [place]. [place] [address] [phone]       OFFICE FOLLOW UP NOTE  [name] Date of Birth:  02/24/1966 Medical Record Number:  [id number]   Reason for Referral:  hospital TIA follow up  CHIEF COMPLAINT:  No chief complaint on file.   HPI: Meagan Flores is being seen today for initial visit in the office for TIA on 04/14/2018. History obtained from patient and chart review. Reviewed all radiology images and labs personally.  [name] is a 52 y.o. female with history of hyperlipidemia, hypothyroidism, and tobacco use presenting with transient left-sided weakness. She did not receive IV t-PA due to mild deficits.  CT head reviewed and was unremarkable for acute abnormalities.  MRI head reviewed and was negative for acute abnormalities.  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  2D echo showed an EF of 60 to 65% without cardiac source of embolus.  LDL 130 and as patient was not on statin PTA was recommended to start Lipitor 40 mg daily.  A1c satisfactory at 5.9.  Patient was on antithrombotic PTA and recommended to start aspirin 325 mg daily.  Patient discharged home in stable condition.      ROS:   14 system review of systems performed and negative with exception of ***  PMH:  Past Medical History:  Diagnosis Date  . Hyperlipidemia   . Hypothyroidism   . Prediabetes   . Stroke ([place])   . Thyroid disease     PSH:  Past Surgical History:  Procedure Laterality Date  . PARTIAL HYSTERECTOMY    . right arm sx    . SHOULDER ARTHROSCOPY WITH DISTAL CLAVICLE RESECTION Right 01/12/2017   Procedure: RIGHT SHOULDER ARTHROSCOPY WITH  SUBACROMIAL DECOMPRESSION, DISTAL CLAVICLE EXCISION, extensive debridement;  Surgeon: Leandrew Koyanagi, MD;  Location: [facility];  Service: Orthopedics;  Laterality: Right;  . THYROIDECTOMY      Social History:  Social History    Socioeconomic History  . Marital status: Married    Spouse name: Not on file  . Number of children: Not on file  . Years of education: Not on file  . Highest education level: Not on file  Occupational History  . Not on file  Social Needs  . Financial resource strain: Not on file  . Food insecurity:    Worry: Not on file    Inability: Not on file  . Transportation needs:    Medical: Not on file    Non-medical: Not on file  Tobacco Use  . Smoking status: Current Every Day Smoker    Packs/day: 0.25    Years: 34.00    Pack years: 8.50  . Smokeless tobacco: Never Used  Substance and Sexual Activity  . Alcohol use: Yes    Comment: occassional  . Drug use: No  . Sexual activity: Yes    Birth control/protection: Surgical  Lifestyle  . Physical activity:    Days per week: Not on file    Minutes per session: Not on file  . Stress: Not on file  Relationships  . Social connections:    Talks on phone: Not on file    Gets together: Not on file    Attends religious service: Not on file    Active member of club or organization: Not on file    Attends meetings of clubs or organizations: Not on file  Relationship status: Not on file  . Intimate partner violence:    Fear of current or ex partner: Not on file    Emotionally abused: Not on file    Physically abused: Not on file    Forced sexual activity: Not on file  Other Topics Concern  . Not on file  Social History Narrative  . Not on file    Family History:  Family History  Problem Relation Age of Onset  . Stomach cancer Sister   . Colon polyps Sister   . Esophageal cancer Neg Hx   . Rectal cancer Neg Hx   . Colon cancer Neg Hx     Medications:   Current Outpatient Medications on File Prior to Visit  Medication Sig Dispense Refill  . aspirin 81 MG tablet Take 1 tablet (81 mg total) by mouth daily. 30 tablet 0  . atorvastatin (LIPITOR) 40 MG tablet Take 1 tablet (40 mg total) by mouth daily at 6 PM. 90 tablet 1   . clobetasol ointment (TEMOVATE) 0.05 % Apply topically 2 (two) times daily. Use for up to 2 weeks or until rash improved. 15 g 0  . levothyroxine (SYNTHROID, LEVOTHROID) 125 MCG tablet Take 1 tablet (125 mcg total) by mouth daily before breakfast. 30 tablet 2  . naproxen (NAPROSYN) 500 MG tablet Take 1 tablet (500 mg total) by mouth 2 (two) times daily with a meal. 60 tablet 0   No current facility-administered medications on file prior to visit.     Allergies:   Allergies  Allergen Reactions  . Sulfa Antibiotics Hives     Physical Exam  There were no vitals filed for this visit. There is no height or weight on file to calculate BMI. No exam data present  General: well developed, well nourished, seated, in no evident distress Head: head normocephalic and atraumatic.   Neck: supple with no carotid or supraclavicular bruits Cardiovascular: regular rate and rhythm, no murmurs Musculoskeletal: no deformity Skin:  no rash/petichiae Vascular:  Normal pulses all extremities  Neurologic Exam Mental Status: Awake and fully alert. Oriented to place and time. Recent and remote memory intact. Attention span, concentration and fund of knowledge appropriate. Mood and affect appropriate.  No flowsheet data found. Cranial Nerves: Fundoscopic exam reveals sharp disc margins. Pupils equal, briskly reactive to light. Extraocular movements full without nystagmus. Visual fields full to confrontation. Hearing intact. Facial sensation intact. Face, tongue, palate moves normally and symmetrically.  Motor: Normal bulk and tone. Normal strength in all tested extremity muscles. Sensory.: intact to touch , pinprick , position and vibratory sensation.  Coordination: Rapid alternating movements normal in all extremities. Finger-to-nose and heel-to-shin performed accurately bilaterally. Gait and Station: Arises from chair without difficulty. Stance is normal. Gait demonstrates normal stride length and balance  . Able to heel, toe and tandem walk without difficulty.  Reflexes: 1+ and symmetric. Toes downgoing.    NIHSS  *** Modified Rankin  ***   Diagnostic Data (Labs, Imaging, Testing) Ct Head Wo Contrast 04/15/2018 IMPRESSION:  Unremarkable noncontrast CT of the head.   MR Brain Wo Contrast  04/16/2018 IMPRESSION: Normal brain MRI. No acute intracranial abnormality identified.  CTA Head and Neck 04/15/2018 IMPRESSION: 1. Negative for carotid or vertebral stenosis in the neck. No significant intracranial stenosis. 2. 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 Ascending aorta 3.8 cm.   Transthoracic Echocardiogram  04/16/2018 Study Conclusions - Left ventricle: The cavity size was normal. Wall thickness was normal. Systolic function was normal. The estimated  ejection fraction was in the range of 60% to 65%. Wall motion was normal; there were no regional wall motion abnormalities. Left ventricular diastolic function parameters were normal. Impressions: - Normal LV function; mild TR.    ASSESSMENT: Meagan Flores is a 52 y.o. year old female here with TIA on 04/14/2018. Vascular risk factors include HLD and HTN.     PLAN: -Continue {anticoagulants:31417}  and ***  for secondary stroke prevention -F/u with PCP regarding your *** management -continue to monitor BP at home  -Maintain strict control of hypertension with blood pressure goal below 130/90, diabetes with hemoglobin A1c goal below 6.5% and cholesterol with LDL cholesterol (bad cholesterol) goal below 70 mg/dL. I also advised the patient to eat a healthy diet with plenty of whole grains, cereals, fruits and vegetables, exercise regularly and maintain ideal body weight.  Follow up in *** or call earlier if needed   Greater than 50% of time during this 25 minute visit was spent on counseling,explanation of diagnosis of ***, reviewing risk factor management of ***, planning of further management, discussion with  patient and family and coordination of care    Venancio Poisson, [facility]  [facility] [address] [place], [place] [address]  Phone [phone] Fax [phone]

## 2018-05-17 NOTE — Telephone Encounter (Signed)
Rn was notified by Penne Lash that pt did not have co payment for appt today. Pt wanted disability form filed out. Pt was not seen today. Pt was told it was a charge for forms to. Pt r/s for 05/31/2018 with Janett Billow stroke NP.No forms were taken.

## 2018-05-23 ENCOUNTER — Encounter: Payer: Self-pay | Admitting: Thoracic Surgery (Cardiothoracic Vascular Surgery)

## 2018-05-23 ENCOUNTER — Institutional Professional Consult (permissible substitution) (INDEPENDENT_AMBULATORY_CARE_PROVIDER_SITE_OTHER): Payer: BLUE CROSS/BLUE SHIELD | Admitting: Thoracic Surgery (Cardiothoracic Vascular Surgery)

## 2018-05-23 ENCOUNTER — Other Ambulatory Visit: Payer: Self-pay

## 2018-05-23 VITALS — BP 113/80 | HR 75 | Resp 18 | Ht 68.0 in | Wt 204.2 lb

## 2018-05-23 DIAGNOSIS — I281 Aneurysm of pulmonary artery: Secondary | ICD-10-CM | POA: Diagnosis not present

## 2018-05-23 NOTE — Progress Notes (Signed)
PCP is Alfonse Spruce, FNP Referring Provider is Argentina Donovan, PA-C  Chief Complaint  Patient presents with  . Thoracic Aortic Aneurysm    new patient consultation, CT 04/15/2018    HPI: Ms. 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  She was admitted to the hospital in May with left-sided weakness.  Her MR was negative for an acute CVA.  CT angiogram of the head and neck showed a 4.4 cm main pulmonary artery.  An echocardiogram showed normal ejection fraction.  There was trivial pulmonic regurgitation and mild tricuspid regurgitation.  RV size and systolic function were normal.  PA pressure was estimated at 19 mmHg. CVP was 3 mmHg.  She has no history of congenital heart disease.  She complains of feeling tired and getting short of breath with exertion.  She also has leg swelling and dizzy spells.  She has been having a lot of anxiety.  Past Medical History:  Diagnosis Date  . Hyperlipidemia   . Hypothyroidism   . Prediabetes   . Stroke ([place])   . Thyroid disease     Past Surgical History:  Procedure Laterality Date  . PARTIAL HYSTERECTOMY    . right arm sx    . SHOULDER ARTHROSCOPY WITH DISTAL CLAVICLE RESECTION Right 01/12/2017   Procedure: RIGHT SHOULDER ARTHROSCOPY WITH  SUBACROMIAL DECOMPRESSION, DISTAL CLAVICLE EXCISION, extensive debridement;  Surgeon: Leandrew Koyanagi, MD;  Location: [facility];  Service: Orthopedics;  Laterality: Right;  . THYROIDECTOMY      Family History  Problem Relation Age of Onset  . Stomach cancer Sister   . Colon polyps Sister   . Esophageal cancer Neg Hx   . Rectal cancer Neg Hx   . Colon cancer Neg Hx     Social History Social History   Tobacco Use  . Smoking status: Current Every Day Smoker    Packs/day: 0.25   Years: 34.00    Pack years: 8.50  . Smokeless tobacco: Never Used  Substance Use Topics  . Alcohol use: Yes    Comment: occassional  . Drug use: No    Current Outpatient Medications  Medication Sig Dispense Refill  . aspirin 81 MG tablet Take 1 tablet (81 mg total) by mouth daily. 30 tablet 0  . atorvastatin (LIPITOR) 40 MG tablet Take 1 tablet (40 mg total) by mouth daily at 6 PM. 90 tablet 1  . clobetasol ointment (TEMOVATE) 0.05 % Apply topically 2 (two) times daily. Use for up to 2 weeks or until rash improved. 15 g 0  . levothyroxine (SYNTHROID, LEVOTHROID) 125 MCG tablet Take 1 tablet (125 mcg total) by mouth daily before breakfast. 30 tablet 2  . naproxen (NAPROSYN) 500 MG tablet Take 1 tablet (500 mg total) by mouth 2 (two) times daily with a meal. 60 tablet 0   No current facility-administered medications for this visit.     Allergies  Allergen Reactions  . Sulfa Antibiotics Hives    Review of Systems  Constitutional: Positive for activity change, fatigue and unexpected weight change (Gained 8 pounds in 3 months). Negative for appetite change.  HENT: Negative for trouble swallowing and voice change.   Eyes: Positive for visual disturbance (Blurry).  Respiratory: Positive for shortness of breath. Negative for cough, chest tightness and wheezing.   Cardiovascular: Positive for leg swelling.  Negative for chest pain.  Gastrointestinal: Negative for abdominal pain and blood in stool.  Genitourinary: Negative for difficulty urinating and dysuria.  Neurological: Positive for dizziness, weakness (Left-sided) and headaches. Negative for syncope.  Hematological: Negative for adenopathy. Does not bruise/bleed easily.  Psychiatric/Behavioral: Positive for dysphoric mood. The patient is nervous/anxious.   All other systems reviewed and are negative.   BP 113/80 (BP Location: Right Arm, Patient Position: Sitting, Cuff Size: Normal)   Pulse 75   Resp 18   Ht 5\' 8"  (1.727 m)   Wt  204 lb 3.2 oz (92.6 kg)   SpO2 96% Comment: RA  BMI 31.05 kg/m  Physical Exam  Constitutional: She is oriented to person, place, and time. She appears well-developed and well-nourished. No distress.  HENT:  Head: Normocephalic and atraumatic.  Mouth/Throat: No oropharyngeal exudate.  Eyes: Conjunctivae and EOM are normal. No scleral icterus.  Neck: No thyromegaly present.  No carotid bruits  Cardiovascular: Normal rate, regular rhythm, normal heart sounds and intact distal pulses. Exam reveals no gallop and no friction rub.  No murmur heard. Pulmonary/Chest: Effort normal and breath sounds normal. No respiratory distress. She has no wheezes.  Abdominal: Soft. She exhibits no distension. There is no tenderness.  Musculoskeletal: She exhibits no edema or deformity.  Lymphadenopathy:    She has no cervical adenopathy.  Neurological: She is alert and oriented to person, place, and time. No cranial nerve deficit.  Good strength bilaterally  Skin: Skin is warm and dry.  Psychiatric:  Flat affect  Vitals reviewed.    Diagnostic Tests: CT ANGIOGRAPHY HEAD AND NECK  TECHNIQUE: Multidetector CT imaging of the head and neck was performed using the standard protocol during bolus administration of intravenous contrast. Multiplanar CT image reconstructions and MIPs were obtained to evaluate the vascular anatomy. Carotid stenosis measurements (when applicable) are obtained utilizing NASCET criteria, using the distal internal carotid diameter as the denominator.  CONTRAST:  49mL ISOVUE-370 IOPAMIDOL (ISOVUE-370) INJECTION 76%  COMPARISON:  CT head 04/15/2018  FINDINGS: CTA NECK FINDINGS  Aortic arch: Ascending aorta 3.8 cm. 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 Surgical clips in the anterior mediastinum. Proximal great vessels widely patent.  Right carotid system: Minimal atherosclerotic disease at the right bifurcation without significant stenosis.  Left carotid system:  Normal left carotid without stenosis or calcification  Vertebral arteries: Both vertebral arteries widely patent.  Skeleton: Mild degenerative changes cervical spine. No acute skeletal abnormality.  Other neck: Negative for mass or adenopathy  Upper chest: Lung apices clear. Median sternotomy with surgical clips in the anterior mediastinum.  Review of the MIP images confirms the above findings  CTA HEAD FINDINGS  Anterior circulation: Cavernous carotid widely patent bilaterally without stenosis. Anterior and middle cerebral arteries normal bilaterally.  Posterior circulation: Both vertebral arteries patent to the basilar. Basilar normal. PICA, superior cerebellar, and posterior cerebral arteries patent bilaterally. Fetal origin of the right posterior cerebral artery.  Venous sinuses: Patent  Anatomic variants: None  Delayed phase: Normal enhancement postcontrast administration. No acute intracranial abnormality.  Review of the MIP images confirms the above findings  IMPRESSION: 1. Negative for carotid or vertebral stenosis in the neck. No significant intracranial stenosis. 2. 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  Ascending aorta 3.8 cm.   Electronically Signed   By: Franchot Gallo M.D.   On: 04/15/2018 18:58 I personally reviewed the CT images and concur with the findings as noted in regards to the chest.  Impression: Lien Lyman is a 52 year old woman  with a history of tobacco abuse, hyperlipidemia, stroke, prediabetes, hypothyroidism, and lobectomy for thymic hyperplasia.  She recently had a stroke characterized by left-sided weakness.  She had a CT angiogram of the head and neck which showed a dilated main pulmonary artery at 4.4 cm.  During the same admission she had an echocardiogram which showed normal right ventricular size and function.  There was trivial pulmonic regurgitation and mild tricuspid regurgitation.  No overt signs of right heart  failure.  There is no indication for surgery at this time.  I explained to Ms. Stacey that there is no clear data regarding operative intervention for aneurysms of the main pulmonary artery.  Typically people use the same dimensions used for ascending aortic aneurysms which would be 5.5 cm.  Most commonly a dilated pulmonary artery is indicative of pulmonary hypertension.  She did not have any definite evidence of that on her echocardiogram.  Tobacco abuse-importance of tobacco cessation was emphasized.Smoking cessation instruction/counseling given:  counseled patient on the dangers of tobacco use, advised patient to stop smoking, and reviewed strategies to maximize success.  She seems reluctant to consider quitting.  Plan: Return in 1 year with CT chest  Melrose Nakayama, MD [facility] [phone]

## 2018-05-31 ENCOUNTER — Encounter: Payer: Self-pay | Admitting: Adult Health

## 2018-05-31 ENCOUNTER — Ambulatory Visit: Payer: BLUE CROSS/BLUE SHIELD | Admitting: Adult Health

## 2018-05-31 VITALS — BP 123/80 | HR 72 | Ht 68.0 in | Wt 207.0 lb

## 2018-05-31 DIAGNOSIS — E785 Hyperlipidemia, unspecified: Secondary | ICD-10-CM | POA: Diagnosis not present

## 2018-05-31 DIAGNOSIS — I10 Essential (primary) hypertension: Secondary | ICD-10-CM | POA: Diagnosis not present

## 2018-05-31 DIAGNOSIS — G459 Transient cerebral ischemic attack, unspecified: Secondary | ICD-10-CM

## 2018-05-31 MED ORDER — TOPIRAMATE 25 MG PO TABS: 25.0000 mg | ORAL_TABLET | Freq: Two times a day (BID) | ORAL | 7 refills | Status: DC

## 2018-05-31 NOTE — Patient Instructions (Signed)
Continue aspirin 81 mg daily  and lipitor  for secondary stroke prevention  Continue to follow up with PCP regarding choelsterol and blood pressure management   Start topamax 25 mg twice a day for headaches  Continue to monitor blood pressure at home  Maintain strict control of hypertension with blood pressure goal below 130/90, diabetes with hemoglobin A1c goal below 6.5% and cholesterol with LDL cholesterol (bad cholesterol) goal below 70 mg/dL. I also advised the patient to eat a healthy diet with plenty of whole grains, cereals, fruits and vegetables, exercise regularly and maintain ideal body weight.  Followup in the future with me in 6 months or call earlier if needed       Thank you for coming to see [place] at [facility]. I hope we have been able to provide you high quality care today.  You may receive a patient satisfaction survey over the next few weeks. We would appreciate your feedback and comments so that we may continue to improve ourselves and the health of our patients.

## 2018-05-31 NOTE — Progress Notes (Signed)
[facility] [address] [place]. [place] [address] [phone]       OFFICE FOLLOW UP NOTE  Meagan Flores Date of Birth:  12-04-1965 Medical Record Number:  [id number]   Reason for Referral:  hospital TIA follow up  CHIEF COMPLAINT:  Chief Complaint  Patient presents with  . Follow-up    [facility] hospital follow up patient saw Dr. Erlinda Hong in hospital, wants disability paperwork filled out, patient is with daughter Roderic Ovens    HPI: Meagan Flores is being seen today for initial visit in the office for TIA on 04/14/2018. History obtained from patient and chart review. Reviewed all radiology images and labs personally.  Meagan Flores is a 52 y.o. female with history of hyperlipidemia, hypothyroidism, and tobacco use presenting with transient left-sided weakness. She did not receive IV t-PA due to mild deficits. CT and MRI unremarkable for acute stroke.  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  2D echo showed EF of 60 to 65% without clear source of embolus.  LDL 130 and recommended to start Lipitor 40 mg daily.  A1c satisfactory at 5.9.  As patient was not on antithrombotic prior to admission was recommended aspirin 325 mg daily.  Patient was discharged home with recommendations of home health PT.  Patient returns today for hospital follow-up.  She states that she did have apparently 1 month of home PT but states it was stopped due to physical therapist wanted to know if we thought she needs additional therapy per patient.  States that her left leg feels numb and "just not my own" along with some left arm pain as she points to her elbow area.  She does complain of having a headache approximately every other day and she was seen by her PCP for this and was started on naproxen but is obtaining only slight relief.  She continues to take aspirin 81 mg without bleeding or bruising.  Continues to take Lipitor without side  effects myalgias.  She states she has not returned to work at this time due to receiving a note from RN during hospital admission stating that she was unable to return to work until she is followed up by her neurologist.  Patient is requesting for FMLA and [employer] disability paperwork to be filled out after hospital discharge but due to letter stating she needed to follow-up with neurology, PCP did not fill that out this paperwork.  Patient was offered a sooner appointment with this office but at that time was refusing to see NP and then did agree to see NP on 05/17/2018 but unfortunately was unable to pay for co-pay and therefore was not seen.  Patient does have FMLA/disability paperwork and requesting to be filled out dating from 04/14/2018 (when she was discharged from hospital) until she is able to return to work.  ROS:   14 system review of systems performed and negative with exception of swelling in legs, ringing in ears, rash, shortness of breath, feeling hot, feeling cold, increased thirst, joint pain, cramps, headache, anxiety, not enough sleep, and sleepiness  PMH:  Past Medical History:  Diagnosis Date  . Hyperlipidemia   . Hypothyroidism   . Prediabetes   . Stroke ([place])   . Thyroid disease     PSH:  Past Surgical History:  Procedure Laterality Date  . PARTIAL HYSTERECTOMY    . right arm sx    . SHOULDER  ARTHROSCOPY WITH DISTAL CLAVICLE RESECTION Right 01/12/2017   Procedure: RIGHT SHOULDER ARTHROSCOPY WITH  SUBACROMIAL DECOMPRESSION, DISTAL CLAVICLE EXCISION, extensive debridement;  Surgeon: Leandrew Koyanagi, MD;  Location: [facility];  Service: Orthopedics;  Laterality: Right;  . THYMECTOMY  2004  . THYROIDECTOMY      Social History:  Social History   Socioeconomic History  . Marital status: Married    Spouse name: Not on file  . Number of children: Not on file  . Years of education: Not on file  . Highest education level: Not on file  Occupational History  .  Not on file  Social Needs  . Financial resource strain: Not on file  . Food insecurity:    Worry: Not on file    Inability: Not on file  . Transportation needs:    Medical: Not on file    Non-medical: Not on file  Tobacco Use  . Smoking status: Current Every Day Smoker    Packs/day: 0.25    Years: 34.00    Pack years: 8.50  . Smokeless tobacco: Never Used  Substance and Sexual Activity  . Alcohol use: Yes    Comment: occassional  . Drug use: No  . Sexual activity: Yes    Birth control/protection: Surgical  Lifestyle  . Physical activity:    Days per week: Not on file    Minutes per session: Not on file  . Stress: Not on file  Relationships  . Social connections:    Talks on phone: Not on file    Gets together: Not on file    Attends religious service: Not on file    Active member of club or organization: Not on file    Attends meetings of clubs or organizations: Not on file    Relationship status: Not on file  . Intimate partner violence:    Fear of current or ex partner: Not on file    Emotionally abused: Not on file    Physically abused: Not on file    Forced sexual activity: Not on file  Other Topics Concern  . Not on file  Social History Narrative  . Not on file    Family History:  Family History  Problem Relation Age of Onset  . Stomach cancer Sister   . Colon polyps Sister   . Esophageal cancer Neg Hx   . Rectal cancer Neg Hx   . Colon cancer Neg Hx     Medications:   Current Outpatient Medications on File Prior to Visit  Medication Sig Dispense Refill  . aspirin 81 MG tablet Take 1 tablet (81 mg total) by mouth daily. 30 tablet 0  . atorvastatin (LIPITOR) 40 MG tablet Take 1 tablet (40 mg total) by mouth daily at 6 PM. 90 tablet 1  . clobetasol ointment (TEMOVATE) 0.05 % Apply topically 2 (two) times daily. Use for up to 2 weeks or until rash improved. 15 g 0  . levothyroxine (SYNTHROID, LEVOTHROID) 125 MCG tablet Take 1 tablet (125 mcg total) by  mouth daily before breakfast. 30 tablet 2  . naproxen (NAPROSYN) 500 MG tablet Take 1 tablet (500 mg total) by mouth 2 (two) times daily with a meal. 60 tablet 0   No current facility-administered medications on file prior to visit.     Allergies:   Allergies  Allergen Reactions  . Sulfa Antibiotics Hives     Physical Exam  Vitals:   05/31/18 0959  BP: 123/80  Pulse: 72  Weight:  207 lb (93.9 kg)  Height: 5\' 8"  (1.727 m)   Body mass index is 31.47 kg/m. No exam data present  General: well developed, well nourished, middle-aged African-American female, seated, in no evident distress Head: head normocephalic and atraumatic.   Neck: supple with no carotid or supraclavicular bruits Cardiovascular: regular rate and rhythm, no murmurs Musculoskeletal: no deformity Skin:  no rash/petichiae Vascular:  Normal pulses all extremities  Neurologic Exam Mental Status: Awake and fully alert. Oriented to place and time. Recent and remote memory intact. Attention span, concentration and fund of knowledge appropriate. Mood and affect appropriate.  Cranial Nerves: Fundoscopic exam reveals sharp disc margins. Pupils equal, briskly reactive to light. Extraocular movements full without nystagmus. Visual fields full to confrontation. Hearing intact. Facial sensation intact. Face, tongue, palate moves normally and symmetrically.  Motor: Normal bulk and tone. Normal strength in all tested extremity muscles. Sensory.: intact to touch , pinprick , position and vibratory sensation.  Coordination: Rapid alternating movements normal in all extremities. Finger-to-nose and heel-to-shin performed accurately bilaterally. Gait and Station: Arises from chair without difficulty. Stance is normal. Gait demonstrates normal stride length and balance with assistance of cane on right side.  Reflexes: 1+ and symmetric. Toes downgoing.    NIHSS  0 Modified Rankin  1   Diagnostic Data (Labs, Imaging,  Testing)  Ct Head Wo Contrast 04/15/2018 IMPRESSION:  Unremarkable noncontrast CT of the head.   MR Brain Wo Contrast  04/16/2018 IMPRESSION: Normal brain MRI. No acute intracranial abnormality identified.  CTA Head and Neck 04/15/2018 IMPRESSION: 1. Negative for carotid or vertebral stenosis in the neck. No significant intracranial stenosis. 2. 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 Ascending aorta 3.8 cm.   Transthoracic Echocardiogram  04/16/2018 Study Conclusions - Left ventricle: The cavity size was normal. Wall thickness was normal. Systolic function was normal. The estimated ejection fraction was in the range of 60% to 65%. Wall motion was normal; there were no regional wall motion abnormalities. Left ventricular diastolic function parameters were normal. Impressions: - Normal LV function; mild TR.      ASSESSMENT: Meagan Flores is a 52 y.o. year old female here with TIA on 04/14/2018. Vascular risk factors include HTN, HLD and tobacco use.     PLAN: -Continue aspirin 81 mg daily  and Lipitor for secondary stroke prevention -Stop naproxen -Start Topamax 25 mg twice daily for headache control -F/u with PCP regarding your HLD and HTN management -continue to monitor BP at home -Advised patient that paperwork can be given to Dr. Leonie Man for review and possible completion.  Patient voiced understanding and it was advised to give paperwork to checkout personnel -Patient released to go back to work full-time without restrictions and work note provided -Maintain strict control of hypertension with blood pressure goal below 130/90, diabetes with hemoglobin A1c goal below 6.5% and cholesterol with LDL cholesterol (bad cholesterol) goal below 70 mg/dL. I also advised the patient to eat a healthy diet with plenty of whole grains, cereals, fruits and vegetables, exercise regularly and maintain ideal body weight.  Follow up in 6 months or call earlier if  needed   Greater than 50% of time during this 25 minute visit was spent on counseling,explanation of diagnosis of TIA, reviewing risk factor management of HTN and HLD, planning of further management, discussion with patient and family and coordination of care    Venancio Poisson, [facility]  [facility] [address] [place], [place] [address]  Phone [phone] Fax [phone]

## 2018-06-06 ENCOUNTER — Ambulatory Visit: Payer: BLUE CROSS/BLUE SHIELD

## 2018-06-12 ENCOUNTER — Ambulatory Visit: Payer: BLUE CROSS/BLUE SHIELD | Attending: Nurse Practitioner | Admitting: Nurse Practitioner

## 2018-06-12 ENCOUNTER — Encounter: Payer: Self-pay | Admitting: Nurse Practitioner

## 2018-06-12 VITALS — BP 111/75 | HR 74 | Temp 98.7°F | Ht 68.0 in | Wt 207.2 lb

## 2018-06-12 DIAGNOSIS — Z8673 Personal history of transient ischemic attack (TIA), and cerebral infarction without residual deficits: Secondary | ICD-10-CM | POA: Insufficient documentation

## 2018-06-12 DIAGNOSIS — E039 Hypothyroidism, unspecified: Secondary | ICD-10-CM | POA: Insufficient documentation

## 2018-06-12 DIAGNOSIS — R7303 Prediabetes: Secondary | ICD-10-CM | POA: Diagnosis not present

## 2018-06-12 DIAGNOSIS — E785 Hyperlipidemia, unspecified: Secondary | ICD-10-CM | POA: Diagnosis not present

## 2018-06-12 DIAGNOSIS — G43909 Migraine, unspecified, not intractable, without status migrainosus: Secondary | ICD-10-CM | POA: Insufficient documentation

## 2018-06-12 LAB — GLUCOSE, POCT (MANUAL RESULT ENTRY): POC Glucose: 109 mg/dl — AB (ref 70–99)

## 2018-06-12 MED ORDER — TOPIRAMATE 100 MG PO TABS: 100.0000 mg | ORAL_TABLET | Freq: Every day | ORAL | 2 refills | Status: DC

## 2018-06-12 NOTE — Progress Notes (Signed)
Assessment & Plan:  Meagan Flores was seen today for follow-up.  Diagnoses and all orders for this visit:  Migraine without status migrainosus, not intractable, unspecified migraine type -     topiramate (TOPAMAX) 100 MG tablet; Take 1 tablet (100 mg total) by mouth daily. Avoid migraine triggers: alcohol, caffeine, smoking, chocolate, wine, cheeses   Prediabetes -     Glucose (CBG) Continue blood sugar control as discussed in office today, low carbohydrate diet, and regular physical exercise as tolerated, 150 minutes per week (30 min each day, 5 days per week, or 50 min 3 days per week). Keep blood sugar logs with fasting goal of 80-130 mg/dl, post prandial less than 180.  For Hypoglycemia: BS <60 and Hyperglycemia BS >400; contact the clinic ASAP. Annual eye exams and foot exams are recommended.     Patient has been counseled on age-appropriate routine health concerns for screening and prevention. These are reviewed and up-to-date. Referrals have been placed accordingly. Immunizations are up-to-date or declined.    Subjective:   Chief Complaint  Patient presents with  . Follow-up    Pt. is here to follow-up on her headaches. Pt. stated she is still geting them.    HPI Meagan Flores 52 y.o. female presents to office today with complaints of headaches.   Headache Ongoing past few months. She had to leave work early 3 days ago on Friday due to her headaches. Her last office appointment with Neurology was 05-31-2018. They prescribed her Topamax 25mg  twice daily for her headaches however today she reports little relief. She has also been taking tylenol at night to help her try to sleep through the headaches. She has an eye exam coming up over the next few weeks. She denies any visual disturbances or new neurological deficits. I will increase her topamax however I have also instructed her that she should be following up with Neurology for her headaches.   Prediabetes Chronic. Stable. Glucose  109 today in office.  Lab Results  Component Value Date   HGBA1C 5.9 (H) 04/15/2018    Review of Systems  Constitutional: Negative for fever, malaise/fatigue and weight loss.  HENT: Negative.  Negative for nosebleeds.   Eyes: Negative.  Negative for blurred vision, double vision and photophobia.  Respiratory: Negative.  Negative for cough and shortness of breath.   Cardiovascular: Negative.  Negative for chest pain, palpitations and leg swelling.  Gastrointestinal: Negative.  Negative for heartburn, nausea and vomiting.  Musculoskeletal: Negative.  Negative for myalgias.  Neurological: Positive for headaches. Negative for dizziness, sensory change, speech change, focal weakness and seizures.  Psychiatric/Behavioral: Negative.  Negative for suicidal ideas.    Past Medical History:  Diagnosis Date  . Hyperlipidemia   . Hypothyroidism   . Prediabetes   . Stroke ([place])   . Thyroid disease     Past Surgical History:  Procedure Laterality Date  . PARTIAL HYSTERECTOMY    . right arm sx    . SHOULDER ARTHROSCOPY WITH DISTAL CLAVICLE RESECTION Right 01/12/2017   Procedure: RIGHT SHOULDER ARTHROSCOPY WITH  SUBACROMIAL DECOMPRESSION, DISTAL CLAVICLE EXCISION, extensive debridement;  Surgeon: Leandrew Koyanagi, MD;  Location: [facility];  Service: Orthopedics;  Laterality: Right;  . THYMECTOMY  2004  . THYROIDECTOMY      Family History  Problem Relation Age of Onset  . Stomach cancer Sister   . Colon polyps Sister   . Esophageal cancer Neg Hx   . Rectal cancer Neg Hx   . Colon  cancer Neg Hx     Social History Reviewed with no changes to be made today.   Outpatient Medications Prior to Visit  Medication Sig Dispense Refill  . aspirin 81 MG tablet Take 1 tablet (81 mg total) by mouth daily. 30 tablet 0  . atorvastatin (LIPITOR) 40 MG tablet Take 1 tablet (40 mg total) by mouth daily at 6 PM. 90 tablet 1  . clobetasol ointment (TEMOVATE) 0.05 % Apply topically 2 (two)  times daily. Use for up to 2 weeks or until rash improved. 15 g 0  . levothyroxine (SYNTHROID, LEVOTHROID) 125 MCG tablet Take 1 tablet (125 mcg total) by mouth daily before breakfast. 30 tablet 2  . topiramate (TOPAMAX) 25 MG tablet Take 1 tablet (25 mg total) by mouth 2 (two) times daily. 60 tablet 7   No facility-administered medications prior to visit.     Allergies  Allergen Reactions  . Sulfa Antibiotics Hives       Objective:    BP 111/75 (BP Location: Left Arm, Patient Position: Sitting, Cuff Size: Large)   Pulse 74   Temp 98.7 F (37.1 C) (Oral)   Ht 5\' 8"  (1.727 m)   Wt 207 lb 3.2 oz (94 kg)   SpO2 94%   BMI 31.50 kg/m  Wt Readings from Last 3 Encounters:  06/12/18 207 lb 3.2 oz (94 kg)  05/31/18 207 lb (93.9 kg)  05/23/18 204 lb 3.2 oz (92.6 kg)    Physical Exam  Constitutional: She is oriented to person, place, and time. She appears well-developed and well-nourished. She is cooperative.  HENT:  Head: Normocephalic and atraumatic.  Eyes: EOM are normal.  Neck: Normal range of motion.  Cardiovascular: Normal rate, regular rhythm and normal heart sounds. Exam reveals no gallop and no friction rub.  No murmur heard. Pulmonary/Chest: Effort normal and breath sounds normal. No tachypnea. No respiratory distress. She has no decreased breath sounds. She has no wheezes. She has no rhonchi. She has no rales. She exhibits no tenderness.  Abdominal: Soft. Bowel sounds are normal.  Musculoskeletal: Normal range of motion. She exhibits no edema.  Neurological: She is alert and oriented to person, place, and time. She has normal strength. No cranial nerve deficit or sensory deficit. Coordination normal.  Skin: Skin is warm and dry.  Psychiatric: She has a normal mood and affect. Her behavior is normal. Judgment and thought content normal.  Nursing note and vitals reviewed.      Patient has been counseled extensively about nutrition and exercise as well as the importance  of adherence with medications and regular follow-up. The patient was given clear instructions to go to ER or return to medical center if symptoms don't improve, worsen or new problems develop. The patient verbalized understanding.   Follow-up: Return in about 8 weeks (around 08/07/2018) for physical/A1c, Fasting labs.   Gildardo Pounds, FNP-BC [facility] [place], [place]   06/12/2018, 11:29 AM

## 2018-06-12 NOTE — Patient Instructions (Signed)
Migraine Headache A migraine headache is an intense, throbbing pain on one side or both sides of the head. Migraines may also cause other symptoms, such as nausea, vomiting, and sensitivity to light and noise. What are the causes? Doing or taking certain things may also trigger migraines, such as:  Alcohol.  Smoking.  Medicines, such as: ? Medicine used to treat chest pain (nitroglycerine). ? Birth control pills. ? Estrogen pills. ? Certain blood pressure medicines.  Aged cheeses, chocolate, or caffeine.  Foods or drinks that contain nitrates, glutamate, aspartame, or tyramine.  Physical activity.  Other things that may trigger a migraine include:  Menstruation.  Pregnancy.  Hunger.  Stress, lack of sleep, too much sleep, or fatigue.  Weather changes.  What increases the risk? The following factors may make you more likely to experience migraine headaches:  Age. Risk increases with age.  Family history of migraine headaches.  Being Caucasian.  Depression and anxiety.  Obesity.  Being a woman.  Having a hole in the heart (patent foramen ovale) or other heart problems.  What are the signs or symptoms? The main symptom of this condition is pulsating or throbbing pain. Pain may:  Happen in any area of the head, such as on one side or both sides.  Interfere with daily activities.  Get worse with physical activity.  Get worse with exposure to bright lights or loud noises.  Other symptoms may include:  Nausea.  Vomiting.  Dizziness.  General sensitivity to bright lights, loud noises, or smells.  Before you get a migraine, you may get warning signs that a migraine is developing (aura). An aura may include:  Seeing flashing lights or having blind spots.  Seeing bright spots, halos, or zigzag lines.  Having tunnel vision or blurred vision.  Having numbness or a tingling feeling.  Having trouble talking.  Having muscle weakness.  How is this  diagnosed? A migraine headache can be diagnosed based on:  Your symptoms.  A physical exam.  Tests, such as CT scan or MRI of the head. These imaging tests can help rule out other causes of headaches.  Taking fluid from the spine (lumbar puncture) and analyzing it (cerebrospinal fluid analysis, or CSF analysis).  How is this treated? A migraine headache is usually treated with medicines that:  Relieve pain.  Relieve nausea.  Prevent migraines from coming back.  Treatment may also include:  Acupuncture.  Lifestyle changes like avoiding foods that trigger migraines.  Follow these instructions at home: Medicines  Take over-the-counter and prescription medicines only as told by your health care provider.  Do not drive or use heavy machinery while taking prescription pain medicine.  To prevent or treat constipation while you are taking prescription pain medicine, your health care provider may recommend that you: ? Drink enough fluid to keep your urine clear or pale yellow. ? Take over-the-counter or prescription medicines. ? Eat foods that are high in fiber, such as fresh fruits and vegetables, whole grains, and beans. ? Limit foods that are high in fat and processed sugars, such as fried and sweet foods. Lifestyle  Avoid alcohol use.  Do not use any products that contain nicotine or tobacco, such as cigarettes and e-cigarettes. If you need help quitting, ask your health care provider.  Get at least 8 hours of sleep every night.  Limit your stress. General instructions   Keep a journal to find out what may trigger your migraine headaches. For example, write down: ? What you eat and   drink. ? How much sleep you get. ? Any change to your diet or medicines.  If you have a migraine: ? Avoid things that make your symptoms worse, such as bright lights. ? It may help to lie down in a dark, quiet room. ? Do not drive or use heavy machinery. ? Ask your health care provider  what activities are safe for you while you are experiencing symptoms.  Keep all follow-up visits as told by your health care provider. This is important. Contact a health care provider if:  You develop symptoms that are different or more severe than your usual migraine symptoms. Get help right away if:  Your migraine becomes severe.  You have a fever.  You have a stiff neck.  You have vision loss.  Your muscles feel weak or like you cannot control them.  You start to lose your balance often.  You develop trouble walking.  You faint. This information is not intended to replace advice given to you by your health care provider. Make sure you discuss any questions you have with your health care provider. Document Released: 11/15/2005 Document Revised: 06/04/2016 Document Reviewed: 05/03/2016 Elsevier Interactive Patient Education  2017 [employer].  Recurrent Migraine Headache A migraine headache is very bad, throbbing pain that is usually on one side of your head. Recurrent migraines keep coming back (recurring). Talk with your doctor about what things may bring on (trigger) your migraine headaches. Follow these instructions at home: Medicines  Take over-the-counter and prescription medicines only as told by your doctor.  Do not drive or use heavy machinery while taking prescription pain medicine. Lifestyle  Do not use any products that contain nicotine or tobacco, such as cigarettes and e-cigarettes. If you need help quitting, ask your doctor.  Limit alcohol intake to no more than 1 drink a day for nonpregnant women and 2 drinks a day for men. One drink equals 12 oz of beer, 5 oz of wine, or 1 oz of hard liquor.  Get 7-9 hours of sleep each night.  Lessen any stress in your life. Ask your doctor about ways to lower your stress.  Stay at a healthy weight. Talk with your doctor if you need help losing weight.  Get regular exercise. General instructions  Keep a journal  to find out if certain things bring on migraine headaches. For example, write down: ? What you eat and drink. ? How much sleep you get. ? Any change to your diet or medicines.  Lie down in a dark, quiet room when you have a migraine.  Try placing a cool towel over your head when you have a migraine.  Keep lights dim if bright lights bother you or make your migraines worse.  Keep all follow-up visits as told by your doctor. This is important. Contact a doctor if:  Medicine does not help your migraines.  Your pain keeps coming back.  You have a fever.  You have weight loss without trying. Get help right away if:  Your migraine becomes really bad and medicine does not help.  You have a stiff neck.  You have trouble seeing.  Your muscles are weak or you lose control of your muscles.  You lose your balance or have trouble walking.  You feel like you will pass out (faint) or you pass out.  You have really bad symptoms that are different than your first symptoms.  You start having sudden, very bad headaches that last for one second or less, like  a thunderclap. Summary  A migraine headache is very bad, throbbing pain that is usually on one side of your head.  Talk with your doctor about what things may bring on (trigger) your migraine headaches.  Take over-the-counter and prescription medicines only as told by your doctor.  Lie down in a dark, quiet room when you have a migraine.  Keep a journal about what you eat and drink, how much sleep you get, and any changes to your medicines. This can help you find out if certain things make you have migraine headaches. This information is not intended to replace advice given to you by your health care provider. Make sure you discuss any questions you have with your health care provider. Document Released: 08/24/2008 Document Revised: 10/08/2016 Document Reviewed: 10/08/2016 Elsevier Interactive Patient Education  2017 [employer].

## 2018-06-13 ENCOUNTER — Encounter

## 2018-06-13 ENCOUNTER — Ambulatory Visit: Payer: BLUE CROSS/BLUE SHIELD | Admitting: Neurology

## 2018-07-17 ENCOUNTER — Ambulatory Visit
Admission: RE | Admit: 2018-07-17 | Discharge: 2018-07-17 | Disposition: A | Discharge status: Home / Self Care | Payer: BLUE CROSS/BLUE SHIELD | Source: Ambulatory Visit | Attending: Nurse Practitioner | Admitting: Nurse Practitioner

## 2018-07-17 DIAGNOSIS — Z1231 Encounter for screening mammogram for malignant neoplasm of breast: Secondary | ICD-10-CM

## 2018-08-08 ENCOUNTER — Encounter: Payer: Self-pay | Admitting: Nurse Practitioner

## 2018-08-08 ENCOUNTER — Ambulatory Visit (HOSPITAL_BASED_OUTPATIENT_CLINIC_OR_DEPARTMENT_OTHER): Payer: BLUE CROSS/BLUE SHIELD | Admitting: Nurse Practitioner

## 2018-08-08 ENCOUNTER — Other Ambulatory Visit (HOSPITAL_COMMUNITY)
Admission: RE | Admit: 2018-08-08 | Discharge: 2018-08-08 | Disposition: A | Discharge status: Home / Self Care | Payer: BLUE CROSS/BLUE SHIELD | Source: Ambulatory Visit | Attending: Nurse Practitioner | Admitting: Nurse Practitioner

## 2018-08-08 VITALS — BP 123/87 | HR 74 | Temp 98.9°F | Ht 68.0 in | Wt 204.0 lb

## 2018-08-08 DIAGNOSIS — M25561 Pain in right knee: Secondary | ICD-10-CM | POA: Diagnosis not present

## 2018-08-08 DIAGNOSIS — N898 Other specified noninflammatory disorders of vagina: Secondary | ICD-10-CM | POA: Insufficient documentation

## 2018-08-08 DIAGNOSIS — Z0000 Encounter for general adult medical examination without abnormal findings: Secondary | ICD-10-CM

## 2018-08-08 DIAGNOSIS — R21 Rash and other nonspecific skin eruption: Secondary | ICD-10-CM | POA: Insufficient documentation

## 2018-08-08 DIAGNOSIS — R252 Cramp and spasm: Secondary | ICD-10-CM | POA: Diagnosis present

## 2018-08-08 MED ORDER — CLOBETASOL PROPIONATE 0.05 % EX OINT: TOPICAL_OINTMENT | Freq: Two times a day (BID) | CUTANEOUS | 0 refills | Status: DC

## 2018-08-08 MED ORDER — MELOXICAM 7.5 MG PO TABS: 7.5000 mg | ORAL_TABLET | Freq: Every day | ORAL | 1 refills | Status: DC

## 2018-08-08 NOTE — Progress Notes (Signed)
Assessment & Plan:  Orpah was seen today for annual exam.  Diagnoses and all orders for this visit:  Well woman exam (no gynecological exam)  Acute pain of right knee -     meloxicam (MOBIC) 7.5 MG tablet; Take 1 tablet (7.5 mg total) by mouth daily. Denies any injury or trauma.  Aggravating factors: Standing, walking, bending Relieving factors: none Pain described as sharp and aching   Muscle cramps -     Magnesium -     CMP14+EGFR  Vaginal itching -     Urine cytology ancillary only -     Urinalysis, Complete -     Microscopic Examination  Skin rash -     clobetasol ointment (TEMOVATE) 0.05 %; Apply topically 2 (two) times daily. Use for up to 2 weeks or until rash improved.    Patient has been counseled on age-appropriate routine health concerns for screening and prevention. These are reviewed and up-to-date. Referrals have been placed accordingly. Immunizations are up-to-date or declined.    Subjective:   Chief Complaint  Patient presents with  . Annual Exam    Pt. is here for a physcial.    HPI SELAH KLANG 52 y.o. female presents to office today for annual physical. She is seeing an endocrinologist in [place] for her hypothyroidism with next follow up appointment in October per her report. She stopped taking topirimate for her headaches as she reports it was causing adverse side effects.    Vaginitis Patient complains of an abnormal vaginal discharge for several weeks. Vaginal symptoms include local irritation and odor.Vulvar symptoms include none.STI Risk: LOW Possible STD exposure. Discharge described as: normal and physiologic.Other associated symptoms: none.Menstrual pattern:  Contraception: status post hysterectomy   Review of Systems  Constitutional: Negative.  Negative for chills, fever, malaise/fatigue and weight loss.  HENT: Negative.  Negative for congestion, hearing loss, sinus pain and sore throat.   Eyes: Negative.  Negative for blurred  vision, double vision, photophobia and pain.  Respiratory: Negative.  Negative for cough, sputum production, shortness of breath and wheezing.   Cardiovascular: Negative.  Negative for chest pain and leg swelling.  Gastrointestinal: Negative.  Negative for abdominal pain, constipation, diarrhea, heartburn, nausea and vomiting.  Genitourinary: Negative.        Vaginitis symptoms  Musculoskeletal: Positive for joint pain (right knee pain) and myalgias (Generalized muscle cramps).  Skin: Positive for rash.  Neurological: Negative.  Negative for dizziness, tremors, speech change, focal weakness, seizures and headaches.  Endo/Heme/Allergies: Negative.  Negative for environmental allergies.  Psychiatric/Behavioral: Negative.  Negative for depression and suicidal ideas. The patient is not nervous/anxious and does not have insomnia.     Past Medical History:  Diagnosis Date  . Hyperlipidemia   . Hypothyroidism   . Prediabetes   . Stroke ([place])   . Thyroid disease     Past Surgical History:  Procedure Laterality Date  . PARTIAL HYSTERECTOMY    . right arm sx    . SHOULDER ARTHROSCOPY WITH DISTAL CLAVICLE RESECTION Right 01/12/2017   Procedure: RIGHT SHOULDER ARTHROSCOPY WITH  SUBACROMIAL DECOMPRESSION, DISTAL CLAVICLE EXCISION, extensive debridement;  Surgeon: Leandrew Koyanagi, MD;  Location: [facility];  Service: Orthopedics;  Laterality: Right;  . THYMECTOMY  2004  . THYROIDECTOMY      Family History  Problem Relation Age of Onset  . Stomach cancer Sister   . Colon polyps Sister   . Esophageal cancer Neg Hx   . Rectal cancer Neg  Hx   . Colon cancer Neg Hx   . Breast cancer Neg Hx     Social History Reviewed with no changes to be made today.   Outpatient Medications Prior to Visit  Medication Sig Dispense Refill  . aspirin 81 MG tablet Take 1 tablet (81 mg total) by mouth daily. 30 tablet 0  . atorvastatin (LIPITOR) 40 MG tablet Take 1 tablet (40 mg total) by mouth  daily at 6 PM. 90 tablet 1  . levothyroxine (SYNTHROID, LEVOTHROID) 125 MCG tablet Take 1 tablet (125 mcg total) by mouth daily before breakfast. 30 tablet 2  . clobetasol ointment (TEMOVATE) 0.05 % Apply topically 2 (two) times daily. Use for up to 2 weeks or until rash improved. 15 g 0  . topiramate (TOPAMAX) 100 MG tablet Take 1 tablet (100 mg total) by mouth daily. 30 tablet 2   No facility-administered medications prior to visit.     Allergies  Allergen Reactions  . Sulfa Antibiotics Hives       Objective:    BP 123/87 (BP Location: Left Arm, Patient Position: Sitting, Cuff Size: Large)   Pulse 74   Temp 98.9 F (37.2 C) (Oral)   Ht '5\' 8"'$  (1.727 m)   Wt 204 lb (92.5 kg)   SpO2 95%   BMI 31.02 kg/m  Wt Readings from Last 3 Encounters:  08/08/18 204 lb (92.5 kg)  06/12/18 207 lb 3.2 oz (94 kg)  05/31/18 207 lb (93.9 kg)    Physical Exam  Constitutional: She is oriented to person, place, and time. She appears well-developed and well-nourished. She is cooperative.  HENT:  Head: Normocephalic and atraumatic.  Right Ear: Hearing, tympanic membrane, external ear and ear canal normal.  Left Ear: Hearing, tympanic membrane, external ear and ear canal normal.  Nose: Nose normal.  Mouth/Throat: Uvula is midline and oropharynx is clear and moist. No oropharyngeal exudate.  Eyes: Pupils are equal, round, and reactive to light. Conjunctivae and EOM are normal. Right eye exhibits no discharge. No scleral icterus.  Neck: Normal range of motion. Neck supple. No tracheal deviation present. No thyromegaly present.  Cardiovascular: Normal rate, regular rhythm, normal heart sounds and intact distal pulses. Exam reveals no gallop and no friction rub.  No murmur heard. Pulses:      Dorsalis pedis pulses are 2+ on the right side, and 2+ on the left side.       Posterior tibial pulses are 2+ on the right side, and 2+ on the left side.  Pulmonary/Chest: Effort normal and breath sounds  normal. No accessory muscle usage. No tachypnea. No respiratory distress. She has no decreased breath sounds. She has no wheezes. She has no rhonchi. She has no rales. She exhibits no tenderness. Right breast exhibits no inverted nipple, no mass, no nipple discharge, no skin change and no tenderness. Left breast exhibits no inverted nipple, no mass, no nipple discharge, no skin change and no tenderness. Breasts are symmetrical.    Abdominal: Soft. Bowel sounds are normal. She exhibits no distension and no mass. There is no tenderness. There is no rebound and no guarding.  Musculoskeletal: Normal range of motion. She exhibits no edema or deformity.       Right knee: She exhibits normal range of motion and no swelling. Tenderness found. Medial joint line tenderness noted.       Legs:      Right foot: There is normal range of motion and no deformity.  Left foot: There is normal range of motion and no deformity.  Feet:  Right Foot:  Skin Integrity: Negative for skin breakdown.  Left Foot:  Skin Integrity: Negative for skin breakdown.  Lymphadenopathy:    She has no cervical adenopathy.  Neurological: She is alert and oriented to person, place, and time. She has normal reflexes. No cranial nerve deficit. Coordination normal.  Skin: Skin is warm and dry. No erythema.  Psychiatric: She has a normal mood and affect. Her speech is normal and behavior is normal. Judgment and thought content normal.  Nursing note and vitals reviewed.     Patient has been counseled extensively about nutrition and exercise as well as the importance of adherence with medications and regular follow-up. The patient was given clear instructions to go to ER or return to medical center if symptoms don't improve, worsen or new problems develop. The patient verbalized understanding.   Follow-up: Return if symptoms worsen or fail to improve.   Gildardo Pounds, FNP-BC [facility] and [place], [place]   08/09/2018, 8:45 PM

## 2018-08-08 NOTE — Patient Instructions (Signed)
Knee Pain, Adult Many things can cause knee pain. The pain often goes away on its own with time and rest. If the pain does not go away, tests may be done to find out what is causing the pain. Follow these instructions at home: Activity  Rest your knee.  Do not do things that cause pain.  Avoid activities where both feet leave the ground at the same time (high-impact activities). Examples are running, jumping rope, and doing jumping jacks. General instructions  Take medicines only as told by your doctor.  Raise (elevate) your knee when you are resting. Make sure your knee is higher than your heart.  Sleep with a pillow under your knee.  If told, put ice on the knee: ? Put ice in a plastic bag. ? Place a towel between your skin and the bag. ? Leave the ice on for 20 minutes, 2-3 times a day.  Ask your doctor if you should wear an elastic knee support.  Lose weight if you are overweight. Being overweight can make your knee hurt more.  Do not use any tobacco products. These include cigarettes, chewing tobacco, or electronic cigarettes. If you need help quitting, ask your doctor. Smoking may slow down healing. Contact a doctor if:  The pain does not stop.  The pain changes or gets worse.  You have a fever along with knee pain.  Your knee gives out or locks up.  Your knee swells, and becomes worse. Get help right away if:  Your knee feels warm.  You cannot move your knee.  You have very bad knee pain.  You have chest pain.  You have trouble breathing. Summary  Many things can cause knee pain. The pain often goes away on its own with time and rest.  Avoid activities that put stress on your knee. These include running and jumping rope.  Get help right away if you cannot move your knee, or if your knee feels warm, or if you have trouble breathing. This information is not intended to replace advice given to you by your health care provider. Make sure you discuss any  questions you have with your health care provider. Document Released: 02/11/2009 Document Revised: 11/09/2016 Document Reviewed: 11/09/2016 Elsevier Interactive Patient Education  2017 [employer]  Osteoarthritis Osteoarthritis is a type of arthritis that affects tissue that covers the ends of bones in joints (cartilage). Cartilage acts as a cushion between the bones and helps them move smoothly. Osteoarthritis results when cartilage in the joints gets worn down. Osteoarthritis is sometimes called "wear and tear" arthritis. Osteoarthritis is the most common form of arthritis. It often occurs in older people. It is a condition that gets worse over time (a progressive condition). Joints that are most often affected by this condition are in:  Fingers.  Toes.  Hips.  Knees.  Spine, including neck and lower back.  What are the causes? This condition is caused by age-related wearing down of cartilage that covers the ends of bones. What increases the risk? The following factors may make you more likely to develop this condition:  Older age.  Being overweight or obese.  Overuse of joints, such as in athletes.  Past injury of a joint.  Past surgery on a joint.  Family history of osteoarthritis.  What are the signs or symptoms? The main symptoms of this condition are pain, swelling, and stiffness in the joint. The joint may lose its shape over time. Small pieces of bone or cartilage may break  off and float inside of the joint, which may cause more pain and damage to the joint. Small deposits of bone (osteophytes) may grow on the edges of the joint. Other symptoms may include:  A grating or scraping feeling inside the joint when you move it.  Popping or creaking sounds when you move.  Symptoms may affect one or more joints. Osteoarthritis in a major joint, such as your knee or hip, can make it painful to walk or exercise. If you have osteoarthritis in your hands, you might not be  able to grip items, twist your hand, or control small movements of your hands and fingers (fine motor skills). How is this diagnosed? This condition may be diagnosed based on:  Your medical history.  A physical exam.  Your symptoms.  X-rays of the affected joint(s).  Blood tests to rule out other types of arthritis.  How is this treated? There is no cure for this condition, but treatment can help to control pain and improve joint function. Treatment plans may include:  A prescribed exercise program that allows for rest and joint relief. You may work with a physical therapist.  A weight control plan.  Pain relief techniques, such as: ? Applying heat and cold to the joint. ? Electric pulses delivered to nerve endings under the skin (transcutaneous electrical nerve stimulation, or TENS). ? Massage. ? Certain nutritional supplements.  NSAIDs or prescription medicines to help relieve pain.  Medicine to help relieve pain and inflammation (corticosteroids). This can be given by mouth (orally) or as an injection.  Assistive devices, such as a brace, wrap, splint, specialized glove, or cane.  Surgery, such as: ? An osteotomy. This is done to reposition the bones and relieve pain or to remove loose pieces of bone and cartilage. ? Joint replacement surgery. You may need this surgery if you have very bad (advanced) osteoarthritis.  Follow these instructions at home: Activity  Rest your affected joints as directed by your health care provider.  Do not drive or use heavy machinery while taking prescription pain medicine.  Exercise as directed. Your health care provider or physical therapist may recommend specific types of exercise, such as: ? Strengthening exercises. These are done to strengthen the muscles that support joints that are affected by arthritis. They can be performed with weights or with exercise bands to add resistance. ? Aerobic activities. These are exercises, such as  brisk walking or water aerobics, that get your heart pumping. ? Range-of-motion activities. These keep your joints easy to move. ? Balance and agility exercises. Managing pain, stiffness, and swelling  If directed, apply heat to the affected area as often as told by your health care provider. Use the heat source that your health care provider recommends, such as a moist heat pack or a heating pad. ? If you have a removable assistive device, remove it as told by your health care provider. ? Place a towel between your skin and the heat source. If your health care provider tells you to keep the assistive device on while you apply heat, place a towel between the assistive device and the heat source. ? Leave the heat on for 20-30 minutes. ? Remove the heat if your skin turns bright red. This is especially important if you are unable to feel pain, heat, or cold. You may have a greater risk of getting burned.  If directed, put ice on the affected joint: ? If you have a removable assistive device, remove it as told by  your health care provider. ? Put ice in a plastic bag. ? Place a towel between your skin and the bag. If your health care provider tells you to keep the assistive device on during icing, place a towel between the assistive device and the bag. ? Leave the ice on for 20 minutes, 2-3 times a day. General instructions  Take over-the-counter and prescription medicines only as told by your health care provider.  Maintain a healthy weight. Follow instructions from your health care provider for weight control. These may include dietary restrictions.  Do not use any products that contain nicotine or tobacco, such as cigarettes and e-cigarettes. These can delay bone healing. If you need help quitting, ask your health care provider.  Use assistive devices as directed by your health care provider.  Keep all follow-up visits as told by your health care provider. This is important. Where to find  more information:  [employer] of Arthritis and Musculoskeletal and Skin Diseases: www.niams.SouthEx[employer].es  [employer] on Aging: http://kim-miller.com/  American College of Rheumatology: www.rheumatology.org Contact a health care provider if:  Your skin turns red.  You develop a rash.  You have pain that gets worse.  You have a fever along with joint or muscle aches. Get help right away if:  You lose a lot of weight.  You suddenly lose your appetite.  You have night sweats. Summary  Osteoarthritis is a type of arthritis that affects tissue covering the ends of bones in joints (cartilage).  This condition is caused by age-related wearing down of cartilage that covers the ends of bones.  The main symptom of this condition is pain, swelling, and stiffness in the joint.  There is no cure for this condition, but treatment can help to control pain and improve joint function. This information is not intended to replace advice given to you by your health care provider. Make sure you discuss any questions you have with your health care provider. Document Released: 11/15/2005 Document Revised: 07/19/2016 Document Reviewed: 07/19/2016 Elsevier Interactive Patient Education  [employer].

## 2018-08-09 ENCOUNTER — Encounter: Payer: Self-pay | Admitting: Nurse Practitioner

## 2018-08-09 LAB — MICROSCOPIC EXAMINATION
BACTERIA UA: NONE SEEN
CASTS: NONE SEEN /LPF

## 2018-08-09 LAB — CMP14+EGFR
ALK PHOS: 98 IU/L (ref 39–117)
ALT: 9 IU/L (ref 0–32)
AST: 13 IU/L (ref 0–40)
Albumin/Globulin Ratio: 1.8 (ref 1.2–2.2)
Albumin: 4.8 g/dL (ref 3.5–5.5)
BILIRUBIN TOTAL: 0.3 mg/dL (ref 0.0–1.2)
BUN / CREAT RATIO: 13 (ref 9–23)
BUN: 10 mg/dL (ref 6–24)
CHLORIDE: 102 mmol/L (ref 96–106)
CO2: 25 mmol/L (ref 20–29)
CREATININE: 0.78 mg/dL (ref 0.57–1.00)
Calcium: 8.7 mg/dL (ref 8.7–10.2)
GFR calc Af Amer: 101 mL/min/{1.73_m2} (ref >59)
GFR calc non Af Amer: 88 mL/min/{1.73_m2} (ref >59)
GLOBULIN, TOTAL: 2.7 g/dL (ref 1.5–4.5)
GLUCOSE: 103 mg/dL — AB (ref 65–99)
Potassium: 3.8 mmol/L (ref 3.5–5.2)
SODIUM: 142 mmol/L (ref 134–144)
Total Protein: 7.5 g/dL (ref 6.0–8.5)

## 2018-08-09 LAB — URINALYSIS, COMPLETE
BILIRUBIN UA: NEGATIVE
Glucose, UA: NEGATIVE
Ketones, UA: NEGATIVE
LEUKOCYTES UA: NEGATIVE
Nitrite, UA: NEGATIVE
PH UA: 5.5 (ref 5.0–7.5)
PROTEIN UA: NEGATIVE
Specific Gravity, UA: 1.021 (ref 1.005–1.030)
Urobilinogen, Ur: 0.2 mg/dL (ref 0.2–1.0)

## 2018-08-09 LAB — URINE CYTOLOGY ANCILLARY ONLY
CHLAMYDIA, DNA PROBE: NEGATIVE
NEISSERIA GONORRHEA: NEGATIVE
Trichomonas: NEGATIVE

## 2018-08-09 LAB — MAGNESIUM: Magnesium: 2 mg/dL (ref 1.6–2.3)

## 2018-08-11 ENCOUNTER — Telehealth: Payer: Self-pay

## 2018-08-11 LAB — URINE CYTOLOGY ANCILLARY ONLY
BACTERIAL VAGINITIS: NEGATIVE
Candida vaginitis: NEGATIVE

## 2018-08-11 NOTE — Telephone Encounter (Signed)
-----   Message from Gildardo Pounds, NP sent at 08/09/2018  8:15 PM EDT ----- Urine is negative for Urinary tract infection. Magnesium level is normal as well as electrolytes. Labs still pending for vaginal bacteria or yeast.

## 2018-08-11 NOTE — Telephone Encounter (Signed)
CMA spoke to patient to inform on results.  Patient verified DOB. Patient understood.  

## 2018-08-14 ENCOUNTER — Telehealth: Payer: Self-pay

## 2018-08-14 NOTE — Telephone Encounter (Signed)
-----   Message from Gildardo Pounds, NP sent at 08/13/2018 11:12 PM EDT ----- Urine cytology is negative for bacteria or yeast

## 2018-08-14 NOTE — Telephone Encounter (Signed)
CMA spoke to patient to inform on results.  Patient verified DOB. Patient understood.  

## 2018-10-02 ENCOUNTER — Ambulatory Visit (HOSPITAL_COMMUNITY)
Admission: EM | Admit: 2018-10-02 | Discharge: 2018-10-02 | Disposition: A | Discharge status: Home / Self Care | Payer: BLUE CROSS/BLUE SHIELD | Attending: Emergency Medicine | Admitting: Emergency Medicine

## 2018-10-02 ENCOUNTER — Encounter (HOSPITAL_COMMUNITY): Payer: Self-pay | Admitting: Emergency Medicine

## 2018-10-02 DIAGNOSIS — M7021 Olecranon bursitis, right elbow: Secondary | ICD-10-CM

## 2018-10-02 DIAGNOSIS — M25561 Pain in right knee: Secondary | ICD-10-CM

## 2018-10-02 MED ORDER — MELOXICAM 7.5 MG PO TABS: 7.5000 mg | ORAL_TABLET | Freq: Every day | ORAL | 0 refills | Status: DC

## 2018-10-02 NOTE — ED Triage Notes (Signed)
Pt c/o R knee swelling x1 week. Pt also c/o pain in her R elbow, states it feels like theres a fluid pocket in her elbow.

## 2018-10-02 NOTE — Discharge Instructions (Signed)
Ice, elevation of knee and elbow, especially after use.  Use of knee sleeve while active.  Ace wrap to elbow to help with swelling. Avoid direct hits/strikes to the elbow to prevent further swelling.  Meloxicam daily to help with pain. Stop ibuprofen and aleve while taking this.  If persistent or no improvement please follow up with your PCP and/or orthopedist.

## 2018-10-02 NOTE — ED Provider Notes (Signed)
[facility]    CSN: [id number] Arrival date & time: 10/02/18  1153     History   Chief Complaint Chief Complaint  Patient presents with  . Knee Pain  . Elbow Pain    HPI DORALEE KOCAK is a 52 y.o. female.   Wylma presents with complaints of right knee pain which has been persistent for the past week. Worse with increased activity. Feels it aches and "nags". Swells at times. Pain at night during sleep as well. No specific injury. No new numbness tingling or weakness. Has taken aleve and advil which haven't helped. Pain 7/10. Hasn't had this evaluated in the past. Also with right elbow swelling. This has improved. Started a few weeks ago. No known injury. Pain with movement of the elbow. She is right handed. No hand numbness or tingling, no weakness. Denies any previous similar. Hx of hyperlipidemia, hypothyroidism, stroke, thyroid disease.    ROS per HPI.      Past Medical History:  Diagnosis Date  . Hyperlipidemia   . Hypothyroidism   . Prediabetes   . Stroke ([place])   . Thyroid disease     Patient Active Problem List   Diagnosis Date Noted  . TIA (transient ischemic attack) 04/15/2018  . Cerebrovascular accident (CVA) due to thrombosis of right posterior cerebral artery ([place])   . Mixed hyperlipidemia   . Essential hypertension   . Smoker   . Bilateral shoulder pain 09/08/2017  . Thyroid disease 07/04/2017  . Tear of right rotator cuff   . Nontraumatic incomplete tear of right rotator cuff 12/24/2016  . Impingement syndrome of right shoulder 12/24/2016  . Right shoulder pain 01/24/2015  . Glenohumeral arthritis 12/27/2014    Past Surgical History:  Procedure Laterality Date  . PARTIAL HYSTERECTOMY    . right arm sx    . SHOULDER ARTHROSCOPY WITH DISTAL CLAVICLE RESECTION Right 01/12/2017   Procedure: RIGHT SHOULDER ARTHROSCOPY WITH  SUBACROMIAL DECOMPRESSION, DISTAL CLAVICLE EXCISION, extensive debridement;  Surgeon: Leandrew Koyanagi, MD;  Location: [facility];  Service: Orthopedics;  Laterality: Right;  . THYMECTOMY  2004  . THYROIDECTOMY      OB History    Gravida  3   Para  3   Term  3   Preterm  0   AB  0   Living  3     SAB  0   TAB  0   Ectopic  0   Multiple  0   Live Births               Home Medications    Prior to Admission medications   Medication Sig Start Date End Date Taking? Authorizing Provider  aspirin 81 MG tablet Take 1 tablet (81 mg total) by mouth daily. 04/16/18   Mariel Aloe, MD  atorvastatin (LIPITOR) 40 MG tablet Take 1 tablet (40 mg total) by mouth daily at 6 PM. 05/12/18   Gildardo Pounds, NP  clobetasol ointment (TEMOVATE) 0.05 % Apply topically 2 (two) times daily. Use for up to 2 weeks or until rash improved. 08/08/18   Gildardo Pounds, NP  levothyroxine (SYNTHROID, LEVOTHROID) 125 MCG tablet Take 1 tablet (125 mcg total) by mouth daily before breakfast. 07/20/17   Alfonse Spruce, FNP  meloxicam (MOBIC) 7.5 MG tablet Take 1 tablet (7.5 mg total) by mouth daily. 10/02/18   Zigmund Gottron, NP  topiramate (TOPAMAX) 100 MG tablet Take 1 tablet (100 mg total) by  mouth daily. 06/12/18 07/12/18  Gildardo Pounds, NP    Family History Family History  Problem Relation Age of Onset  . Stomach cancer Sister   . Colon polyps Sister   . Esophageal cancer Neg Hx   . Rectal cancer Neg Hx   . Colon cancer Neg Hx   . Breast cancer Neg Hx     Social History Social History   Tobacco Use  . Smoking status: Current Every Day Smoker    Packs/day: 0.25    Years: 34.00    Pack years: 8.50  . Smokeless tobacco: Never Used  Substance Use Topics  . Alcohol use: Yes    Comment: occassional  . Drug use: No     Allergies   Sulfa antibiotics   Review of Systems Review of Systems   Physical Exam Triage Vital Signs ED Triage Vitals  Enc Vitals Group     BP 10/02/18 1315 137/87     Pulse Rate 10/02/18 1315 73     Resp 10/02/18 1315 16     Temp 10/02/18 1315 98.2  F (36.8 C)     Temp src --      SpO2 10/02/18 1315 97 %     Weight --      Height --      Head Circumference --      Peak Flow --      Pain Score 10/02/18 1316 7     Pain Loc --      Pain Edu? --      Excl. in [place]? --    No data found.  Updated Vital Signs BP 137/87   Pulse 73   Temp 98.2 F (36.8 C)   Resp 16   SpO2 97%    Physical Exam  Constitutional: She is oriented to person, place, and time. She appears well-developed and well-nourished. No distress.  Cardiovascular: Normal rate, regular rhythm and normal heart sounds.  Pulmonary/Chest: Effort normal and breath sounds normal.  Musculoskeletal:       Right elbow: She exhibits swelling and effusion. She exhibits normal range of motion, no deformity and no laceration. Tenderness found. Olecranon process tenderness noted.       Right knee: She exhibits swelling. She exhibits normal range of motion, no effusion, no ecchymosis, no deformity, no laceration, no erythema, no LCL laxity, normal patellar mobility, no bony tenderness, normal meniscus and no MCL laxity. Tenderness found. Medial joint line and lateral joint line tenderness noted.  Effusion overlying right olecranon process with tenderness; no redness, not warm; mild pain with flexion but with full ROM of elbow; strength equal bilaterally; gross sensation intact to upper extremities; right knee without specific point tenderness, generalized tenderness to both medial and lateral joint; no redness, no obvious effusion,very slight swelling; pain with extension, no laxity and negative anterior drawer   Neurological: She is alert and oriented to person, place, and time.  Skin: Skin is warm and dry.     UC Treatments / Results  Labs (all labs ordered are listed, but only abnormal results are displayed) Labs Reviewed - No data to display  EKG None  Radiology No results found.  Procedures Procedures (including critical care time)  Medications Ordered in  UC Medications - No data to display  Initial Impression / Assessment and Plan / UC Course  I have reviewed the triage vital signs and the nursing notes.  Pertinent labs & imaging results that were available during my care of the patient were  reviewed by me and considered in my medical decision making (see chart for details).     Improving right olecranon bursitis. Ace wrap applied. Suspect arthritis right knee. Sleeve provided for compression and stability. Daily meloxicam. Ice, elevation. Follow up with pcp and/or ortho as needed. Patient verbalized understanding and agreeable to plan.  Ambulatory out of clinic without difficulty.   Final Clinical Impressions(s) / UC Diagnoses   Final diagnoses:  Right knee pain, unspecified chronicity  Olecranon bursitis of right elbow     Discharge Instructions     Ice, elevation of knee and elbow, especially after use.  Use of knee sleeve while active.  Ace wrap to elbow to help with swelling. Avoid direct hits/strikes to the elbow to prevent further swelling.  Meloxicam daily to help with pain. Stop ibuprofen and aleve while taking this.  If persistent or no improvement please follow up with your PCP and/or orthopedist.    ED Prescriptions    Medication Sig Dispense Auth. Provider   meloxicam (MOBIC) 7.5 MG tablet Take 1 tablet (7.5 mg total) by mouth daily. 30 tablet Zigmund Gottron, NP     Controlled Substance Prescriptions [place] Controlled Substance Registry consulted? Not Applicable   Zigmund Gottron, NP 10/02/18 1357

## 2018-12-06 ENCOUNTER — Telehealth: Payer: Self-pay

## 2018-12-06 ENCOUNTER — Ambulatory Visit: Payer: BLUE CROSS/BLUE SHIELD | Admitting: Adult Health

## 2018-12-06 NOTE — Telephone Encounter (Signed)
Patient no show for appt today. 

## 2018-12-06 NOTE — Progress Notes (Deleted)
[facility] [address] [place]. [place] [address] [phone]       OFFICE FOLLOW UP NOTE  [name] Date of Birth:  [birth year] Medical Record Number:  [id number]   Reason for Referral:  hospital TIA follow up  CHIEF COMPLAINT:  No chief complaint on file.   HPI:   Interval history 12/06/2018: Patient is being seen today for follow-up appointment after TIA on 04/14/2018.  She continues to take aspirin without side effects of bleeding or bruising.  Continues on atorvastatin without side effects myalgias.  Blood pressure today ***.  No further concerns at this time.  Denies new or worsening stroke/TIA symptoms.   Initial visit 05/31/2018: Meagan Flores is being seen today for initial visit in the office for TIA on 04/14/2018. History obtained from patient and chart review. Reviewed all radiology images and labs personally.  [name] is a 53 y.o. female with history of hyperlipidemia, hypothyroidism, and tobacco use presenting with transient left-sided weakness. She did not receive IV t-PA due to mild deficits. CT and MRI unremarkable for acute stroke.  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  2D echo showed EF of 60 to 65% without clear source of embolus.  LDL 130 and recommended to start Lipitor 40 mg daily.  A1c satisfactory at 5.9.  As patient was not on antithrombotic prior to admission was recommended aspirin 325 mg daily.  Patient was discharged home with recommendations of home health PT.  Patient returns today for hospital follow-up.  She states that she did have apparently 1 month of home PT but states it was stopped due to physical therapist wanted to know if we thought she needs additional therapy per patient.  States that her left leg feels numb and "just not my own" along with some left arm pain as she points to her elbow area.  She does complain of having a headache approximately  every other day and she was seen by her PCP for this and was started on naproxen but is obtaining only slight relief.  She continues to take aspirin 81 mg without bleeding or bruising.  Continues to take Lipitor without side effects myalgias.  She states she has not returned to work at this time due to receiving a note from RN during hospital admission stating that she was unable to return to work until she is followed up by her neurologist.  Patient is requesting for FMLA and [employer] disability paperwork to be filled out after hospital discharge but due to letter stating she needed to follow-up with neurology, PCP did not fill that out this paperwork.  Patient was offered a sooner appointment with this office but at that time was refusing to see NP and then did agree to see NP on 05/17/2018 but unfortunately was unable to pay for co-pay and therefore was not seen.  Patient does have FMLA/disability paperwork and requesting to be filled out dating from 04/14/2018 (when she was discharged from hospital) until she is able to return to work.     ROS:   14 system review of systems performed and negative with exception of swelling in legs, ringing in ears, rash, shortness of breath, feeling hot, feeling cold, increased thirst, joint pain, cramps, headache, anxiety, not enough sleep, and sleepiness  PMH:  Past Medical History:  Diagnosis Date  . Hyperlipidemia   . Hypothyroidism   . Prediabetes   .  Stroke ([place])   . Thyroid disease     PSH:  Past Surgical History:  Procedure Laterality Date  . PARTIAL HYSTERECTOMY    . right arm sx    . SHOULDER ARTHROSCOPY WITH DISTAL CLAVICLE RESECTION Right 01/12/2017   Procedure: RIGHT SHOULDER ARTHROSCOPY WITH  SUBACROMIAL DECOMPRESSION, DISTAL CLAVICLE EXCISION, extensive debridement;  Surgeon: Leandrew Koyanagi, MD;  Location: [facility];  Service: Orthopedics;  Laterality: Right;  . THYMECTOMY  2004  . THYROIDECTOMY      Social History:  Social  History   Socioeconomic History  . Marital status: Married    Spouse name: Not on file  . Number of children: Not on file  . Years of education: Not on file  . Highest education level: Not on file  Occupational History  . Not on file  Social Needs  . Financial resource strain: Not on file  . Food insecurity:    Worry: Not on file    Inability: Not on file  . Transportation needs:    Medical: Not on file    Non-medical: Not on file  Tobacco Use  . Smoking status: Current Every Day Smoker    Packs/day: 0.25    Years: 34.00    Pack years: 8.50  . Smokeless tobacco: Never Used  Substance and Sexual Activity  . Alcohol use: Yes    Comment: occassional  . Drug use: No  . Sexual activity: Yes    Birth control/protection: Surgical  Lifestyle  . Physical activity:    Days per week: Not on file    Minutes per session: Not on file  . Stress: Not on file  Relationships  . Social connections:    Talks on phone: Not on file    Gets together: Not on file    Attends religious service: Not on file    Active member of club or organization: Not on file    Attends meetings of clubs or organizations: Not on file    Relationship status: Not on file  . Intimate partner violence:    Fear of current or ex partner: Not on file    Emotionally abused: Not on file    Physically abused: Not on file    Forced sexual activity: Not on file  Other Topics Concern  . Not on file  Social History Narrative  . Not on file    Family History:  Family History  Problem Relation Age of Onset  . Stomach cancer Sister   . Colon polyps Sister   . Esophageal cancer Neg Hx   . Rectal cancer Neg Hx   . Colon cancer Neg Hx   . Breast cancer Neg Hx     Medications:   Current Outpatient Medications on File Prior to Visit  Medication Sig Dispense Refill  . aspirin 81 MG tablet Take 1 tablet (81 mg total) by mouth daily. 30 tablet 0  . atorvastatin (LIPITOR) 40 MG tablet Take 1 tablet (40 mg total) by  mouth daily at 6 PM. 90 tablet 1  . clobetasol ointment (TEMOVATE) 0.05 % Apply topically 2 (two) times daily. Use for up to 2 weeks or until rash improved. 30 g 0  . levothyroxine (SYNTHROID, LEVOTHROID) 125 MCG tablet Take 1 tablet (125 mcg total) by mouth daily before breakfast. 30 tablet 2  . meloxicam (MOBIC) 7.5 MG tablet Take 1 tablet (7.5 mg total) by mouth daily. 30 tablet 0  . topiramate (TOPAMAX) 100 MG tablet Take 1 tablet (  100 mg total) by mouth daily. 30 tablet 2   No current facility-administered medications on file prior to visit.     Allergies:   Allergies  Allergen Reactions  . Sulfa Antibiotics Hives     Physical Exam  There were no vitals filed for this visit. There is no height or weight on file to calculate BMI. No exam data present  General: well developed, well nourished, middle-aged African-American female, seated, in no evident distress Head: head normocephalic and atraumatic.   Neck: supple with no carotid or supraclavicular bruits Cardiovascular: regular rate and rhythm, no murmurs Musculoskeletal: no deformity Skin:  no rash/petichiae Vascular:  Normal pulses all extremities  Neurologic Exam Mental Status: Awake and fully alert. Oriented to place and time. Recent and remote memory intact. Attention span, concentration and fund of knowledge appropriate. Mood and affect appropriate.  Cranial Nerves: Fundoscopic exam reveals sharp disc margins. Pupils equal, briskly reactive to light. Extraocular movements full without nystagmus. Visual fields full to confrontation. Hearing intact. Facial sensation intact. Face, tongue, palate moves normally and symmetrically.  Motor: Normal bulk and tone. Normal strength in all tested extremity muscles. Sensory.: intact to touch , pinprick , position and vibratory sensation.  Coordination: Rapid alternating movements normal in all extremities. Finger-to-nose and heel-to-shin performed accurately bilaterally. Gait and  Station: Arises from chair without difficulty. Stance is normal. Gait demonstrates normal stride length and balance with assistance of cane on right side.  Reflexes: 1+ and symmetric. Toes downgoing.    NIHSS  0 Modified Rankin  1   Diagnostic Data (Labs, Imaging, Testing)  Ct Head Wo Contrast 04/15/2018 IMPRESSION:  Unremarkable noncontrast CT of the head.   MR Brain Wo Contrast  04/16/2018 IMPRESSION: Normal brain MRI. No acute intracranial abnormality identified.  CTA Head and Neck 04/15/2018 IMPRESSION: 1. Negative for carotid or vertebral stenosis in the neck. No significant intracranial stenosis. 2. 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 Ascending aorta 3.8 cm.   Transthoracic Echocardiogram  04/16/2018 Study Conclusions - Left ventricle: The cavity size was normal. Wall thickness was normal. Systolic function was normal. The estimated ejection fraction was in the range of 60% to 65%. Wall motion was normal; there were no regional wall motion abnormalities. Left ventricular diastolic function parameters were normal. Impressions: - Normal LV function; mild TR.      ASSESSMENT: Meagan Flores is a 53 y.o. year old female here with TIA on 04/14/2018. Vascular risk factors include HTN, HLD and tobacco use.     PLAN: -Continue aspirin 81 mg daily  and Lipitor for secondary stroke prevention -Stop naproxen -Start Topamax 25 mg twice daily for headache control -F/u with PCP regarding your HLD and HTN management -continue to monitor BP at home -Advised patient that paperwork can be given to Dr. Leonie Man for review and possible completion.  Patient voiced understanding and it was advised to give paperwork to checkout personnel -Patient released to go back to work full-time without restrictions and work note provided -Maintain strict control of hypertension with blood pressure goal below 130/90, diabetes with hemoglobin A1c goal below 6.5% and cholesterol with LDL  cholesterol (bad cholesterol) goal below 70 mg/dL. I also advised the patient to eat a healthy diet with plenty of whole grains, cereals, fruits and vegetables, exercise regularly and maintain ideal body weight.  Follow up in 6 months or call earlier if needed   Greater than 50% of time during this 25 minute visit was spent on counseling,explanation of diagnosis of TIA, reviewing  risk factor management of HTN and HLD, planning of further management, discussion with patient and family and coordination of care    Venancio Poisson, [facility]  [facility] [address] [place], [place] [address]  Phone [phone] Fax [phone]

## 2018-12-07 ENCOUNTER — Encounter: Payer: Self-pay | Admitting: Adult Health

## 2019-01-08 ENCOUNTER — Ambulatory Visit (HOSPITAL_COMMUNITY)
Admission: EM | Admit: 2019-01-08 | Discharge: 2019-01-08 | Disposition: A | Discharge status: Home / Self Care | Payer: BLUE CROSS/BLUE SHIELD | Attending: Family Medicine | Admitting: Family Medicine

## 2019-01-08 ENCOUNTER — Other Ambulatory Visit: Payer: Self-pay

## 2019-01-08 ENCOUNTER — Encounter (HOSPITAL_COMMUNITY): Payer: Self-pay

## 2019-01-08 DIAGNOSIS — M545 Low back pain, unspecified: Secondary | ICD-10-CM

## 2019-01-08 DIAGNOSIS — N898 Other specified noninflammatory disorders of vagina: Secondary | ICD-10-CM

## 2019-01-08 DIAGNOSIS — L739 Follicular disorder, unspecified: Secondary | ICD-10-CM | POA: Diagnosis not present

## 2019-01-08 MED ORDER — MELOXICAM 7.5 MG PO TABS: 7.5000 mg | ORAL_TABLET | Freq: Every day | ORAL | 0 refills | Status: DC

## 2019-01-08 MED ORDER — KETOROLAC TROMETHAMINE 30 MG/ML IJ SOLN
30.0000 mg | Freq: Once | INTRAMUSCULAR | Status: AC
Start: 2019-01-08 — End: 2019-01-08
  Administered 2019-01-08: 30 mg via INTRAMUSCULAR

## 2019-01-08 MED ORDER — MUPIROCIN 2 % EX OINT: 1.0000 "application " | TOPICAL_OINTMENT | Freq: Two times a day (BID) | CUTANEOUS | 0 refills | Status: DC

## 2019-01-08 MED ORDER — METHOCARBAMOL 500 MG PO TABS: 500.0000 mg | ORAL_TABLET | Freq: Two times a day (BID) | ORAL | 0 refills | Status: DC

## 2019-01-08 MED ORDER — KETOROLAC TROMETHAMINE 30 MG/ML IJ SOLN
INTRAMUSCULAR | Status: AC
  Filled 2019-01-08: qty 1

## 2019-01-08 NOTE — ED Provider Notes (Signed)
[place]    CSN: [id number] Arrival date & time: 01/08/19  1630     History   Chief Complaint Chief Complaint  Patient presents with  . Back Pain    HPI Meagan Flores is a 53 y.o. female.   53 year old female comes in for multiple complaints.  Low back pain for the past few weeks.  States was pushing a cart at work when she heard a pop in her back, causing pain.  Pain has not been severe, and has been taking Advil with good relief.  States went to work yesterday with increase in pain and had to go home.  Pain is in bilateral lower back, intermittent, worse with certain movement.  Denies radiation of pain.  Denies injury/trauma.  Denies saddle anesthesia, loss of bladder or bowel control.  Denies leg weakness.  Denies urinary symptoms such as frequency, dysuria, hematuria.  Patient states has had a "cyst"/vaginal irritation for the past few weeks.  States "I know my body, and something is wrong".  Denies injury/trauma.  Denies vaginal discharge.  She has mild vaginal itching.  Denies abdominal pain, nausea, vomiting.  History of hysterectomy.  States does shave, but has not shaved in a while.  Sexually active with one female partner, no condom use.      Past Medical History:  Diagnosis Date  . Hyperlipidemia   . Hypothyroidism   . Prediabetes   . Stroke ([place])   . Thyroid disease     Patient Active Problem List   Diagnosis Date Noted  . TIA (transient ischemic attack) 04/15/2018  . Cerebrovascular accident (CVA) due to thrombosis of right posterior cerebral artery ([place])   . Mixed hyperlipidemia   . Essential hypertension   . Smoker   . Bilateral shoulder pain 09/08/2017  . Thyroid disease 07/04/2017  . Tear of right rotator cuff   . Nontraumatic incomplete tear of right rotator cuff 12/24/2016  . Impingement syndrome of right shoulder 12/24/2016  . Right shoulder pain 01/24/2015  . Glenohumeral arthritis 12/27/2014    Past Surgical History:  Procedure  Laterality Date  . PARTIAL HYSTERECTOMY    . right arm sx    . SHOULDER ARTHROSCOPY WITH DISTAL CLAVICLE RESECTION Right 01/12/2017   Procedure: RIGHT SHOULDER ARTHROSCOPY WITH  SUBACROMIAL DECOMPRESSION, DISTAL CLAVICLE EXCISION, extensive debridement;  Surgeon: Leandrew Koyanagi, MD;  Location: [facility];  Service: Orthopedics;  Laterality: Right;  . THYMECTOMY  2004  . THYROIDECTOMY      OB History    Gravida  3   Para  3   Term  3   Preterm  0   AB  0   Living  3     SAB  0   TAB  0   Ectopic  0   Multiple  0   Live Births               Home Medications    Prior to Admission medications   Medication Sig Start Date End Date Taking? Authorizing Provider  aspirin 81 MG tablet Take 1 tablet (81 mg total) by mouth daily. 04/16/18   Mariel Aloe, MD  atorvastatin (LIPITOR) 40 MG tablet Take 1 tablet (40 mg total) by mouth daily at 6 PM. 05/12/18   Gildardo Pounds, NP  clobetasol ointment (TEMOVATE) 0.05 % Apply topically 2 (two) times daily. Use for up to 2 weeks or until rash improved. 08/08/18   Gildardo Pounds, NP  levothyroxine (  SYNTHROID, LEVOTHROID) 125 MCG tablet Take 1 tablet (125 mcg total) by mouth daily before breakfast. 07/20/17   Alfonse Spruce, FNP  meloxicam (MOBIC) 7.5 MG tablet Take 1 tablet (7.5 mg total) by mouth daily. 01/08/19   Tasia Catchings, Amy V, PA-C  methocarbamol (ROBAXIN) 500 MG tablet Take 1 tablet (500 mg total) by mouth 2 (two) times daily. 01/08/19   Tasia Catchings, Amy V, PA-C  mupirocin ointment (BACTROBAN) 2 % Apply 1 application topically 2 (two) times daily. 01/08/19   Tasia Catchings, Amy V, PA-C  topiramate (TOPAMAX) 100 MG tablet Take 1 tablet (100 mg total) by mouth daily. 06/12/18 07/12/18  Gildardo Pounds, NP    Family History Family History  Problem Relation Age of Onset  . Stomach cancer Sister   . Colon polyps Sister   . Esophageal cancer Neg Hx   . Rectal cancer Neg Hx   . Colon cancer Neg Hx   . Breast cancer Neg Hx     Social  History Social History   Tobacco Use  . Smoking status: Current Every Day Smoker    Packs/day: 0.25    Years: 34.00    Pack years: 8.50  . Smokeless tobacco: Never Used  Substance Use Topics  . Alcohol use: Yes    Comment: occassional  . Drug use: No     Allergies   Sulfa antibiotics   Review of Systems Review of Systems  Reason unable to perform ROS: See HPI as above.     Physical Exam Triage Vital Signs ED Triage Vitals  Enc Vitals Group     BP 01/08/19 1735 118/77     Pulse Rate 01/08/19 1735 83     Resp 01/08/19 1735 18     Temp 01/08/19 1735 98.1 F (36.7 C)     Temp Source 01/08/19 1735 Tympanic     SpO2 01/08/19 1735 99 %     Weight --      Height --      Head Circumference --      Peak Flow --      Pain Score 01/08/19 1737 10     Pain Loc --      Pain Edu? --      Excl. in [place]? --    No data found.  Updated Vital Signs BP 118/77 (BP Location: Right Arm)   Pulse 83   Temp 98.1 F (36.7 C) (Tympanic)   Resp 18   SpO2 99%   Visual Acuity Right Eye Distance:   Left Eye Distance:   Bilateral Distance:    Right Eye Near:   Left Eye Near:    Bilateral Near:     Physical Exam Exam conducted with a chaperone present.  Constitutional:      General: She is not in acute distress.    Appearance: She is well-developed. She is not diaphoretic.  HENT:     Head: Normocephalic and atraumatic.  Eyes:     Conjunctiva/sclera: Conjunctivae normal.     Pupils: Pupils are equal, round, and reactive to light.  Cardiovascular:     Rate and Rhythm: Normal rate and regular rhythm.     Heart sounds: Normal heart sounds. No murmur. No friction rub. No gallop.   Pulmonary:     Effort: Pulmonary effort is normal. No accessory muscle usage or respiratory distress.     Breath sounds: Normal breath sounds. No stridor. No decreased breath sounds, wheezing, rhonchi or rales.  Genitourinary:    Vagina: Normal. No  vaginal discharge.    Musculoskeletal:      Comments: No tenderness on palpation of the spinous processes. Tenderness to palpation of bilateral lumbar region diffusely. Full range of motion of back and hips. Strength normal and equal bilaterally. Sensation intact and equal bilaterally. Negative straight leg raise.   Skin:    General: Skin is warm and dry.  Neurological:     Mental Status: She is alert and oriented to person, place, and time.      UC Treatments / Results  Labs (all labs ordered are listed, but only abnormal results are displayed) Labs Reviewed - No data to display  EKG None  Radiology No results found.  Procedures Procedures (including critical care time)  Medications Ordered in UC Medications  ketorolac (TORADOL) 30 MG/ML injection 30 mg (has no administration in time range)    Initial Impression / Assessment and Plan / UC Course  I have reviewed the triage vital signs and the nursing notes.  Pertinent labs & imaging results that were available during my care of the patient were reviewed by me and considered in my medical decision making (see chart for details).     Start NSAID as directed for pain and inflammation. Muscle relaxant as needed. Ice/heat compresses. Discussed with patient strain can take up to 3-4 weeks to resolve, but should be getting better each week. Return precautions given.   Bactroban for folliculitis.  Will have patient warm compress.  Refrain from shaving.  Return precautions given.  Patient expresses understanding and agrees to plan.  Final Clinical Impressions(s) / UC Diagnoses   Final diagnoses:  Acute bilateral low back pain without sciatica  Vaginal irritation    ED Prescriptions    Medication Sig Dispense Auth. Provider   meloxicam (MOBIC) 7.5 MG tablet Take 1 tablet (7.5 mg total) by mouth daily. 15 tablet Yu, Amy V, PA-C   methocarbamol (ROBAXIN) 500 MG tablet Take 1 tablet (500 mg total) by mouth 2 (two) times daily. 20 tablet Yu, Amy V, PA-C   mupirocin ointment  (BACTROBAN) 2 % Apply 1 application topically 2 (two) times daily. 22 g Tobin Chad, [place] 01/08/19 1830

## 2019-01-08 NOTE — Discharge Instructions (Signed)
Start Mobic. Do not take ibuprofen (motrin/advil)/ naproxen (aleve) while on mobic. Robaxin as needed, this can make you drowsy, so do not take if you are going to drive, operate heavy machinery, or make important decisions. Ice/heat compresses as needed. This can take up to 3-4 weeks to completely resolve, but you should be feeling better each week. Follow up here or with PCP if symptoms worsen, changes for reevaluation. If experience numbness/tingling of the inner thighs, loss of bladder or bowel control, go to the emergency department for evaluation.   Apply bactroban to affected area. Refrain from shaving at this time. Warm compresses. Follow up with PCP for further evaluation if symptoms not improving.

## 2019-01-08 NOTE — ED Triage Notes (Signed)
Pt cc lower back pain . Pt states she was pushing a cart and heard a pop in her back a week ago. Pt states she can see something like a cyst in her vaginal area.

## 2019-01-10 ENCOUNTER — Emergency Department (HOSPITAL_COMMUNITY)
Admission: EM | Admit: 2019-01-10 | Discharge: 2019-01-10 | Disposition: A | Discharge status: Home / Self Care | Payer: BLUE CROSS/BLUE SHIELD | Attending: Emergency Medicine | Admitting: Emergency Medicine

## 2019-01-10 DIAGNOSIS — M5441 Lumbago with sciatica, right side: Secondary | ICD-10-CM | POA: Diagnosis not present

## 2019-01-10 DIAGNOSIS — Z7982 Long term (current) use of aspirin: Secondary | ICD-10-CM | POA: Insufficient documentation

## 2019-01-10 DIAGNOSIS — E039 Hypothyroidism, unspecified: Secondary | ICD-10-CM | POA: Diagnosis not present

## 2019-01-10 DIAGNOSIS — F17210 Nicotine dependence, cigarettes, uncomplicated: Secondary | ICD-10-CM | POA: Insufficient documentation

## 2019-01-10 DIAGNOSIS — Z79899 Other long term (current) drug therapy: Secondary | ICD-10-CM | POA: Diagnosis not present

## 2019-01-10 DIAGNOSIS — M545 Low back pain: Secondary | ICD-10-CM | POA: Diagnosis present

## 2019-01-10 DIAGNOSIS — I10 Essential (primary) hypertension: Secondary | ICD-10-CM | POA: Diagnosis not present

## 2019-01-10 MED ORDER — MORPHINE SULFATE (PF) 4 MG/ML IV SOLN
6.0000 mg | Freq: Once | INTRAVENOUS | Status: AC
  Administered 2019-01-10: 6 mg via INTRAMUSCULAR
  Filled 2019-01-10: qty 2

## 2019-01-10 MED ORDER — DICLOFENAC SODIUM 1 % TD GEL: 4.0000 g | Freq: Four times a day (QID) | TRANSDERMAL | 0 refills | Status: DC

## 2019-01-10 MED ORDER — HYDROCODONE-ACETAMINOPHEN 5-325 MG PO TABS: 1.0000 | ORAL_TABLET | Freq: Four times a day (QID) | ORAL | 0 refills | Status: DC | PRN

## 2019-01-10 MED ORDER — METHYLPREDNISOLONE 4 MG PO TBPK: ORAL_TABLET | ORAL | 0 refills | Status: DC

## 2019-01-10 NOTE — ED Triage Notes (Signed)
Pt reports lower back pain with radiation into L leg. Pt was seen at [facility] 2 days ago for same. Given Rx with no relief. Ambulatory.

## 2019-01-10 NOTE — Discharge Instructions (Addendum)
Take Medrol Dosepak until completed.  Apply Voltaren gel to your back 4 times daily as needed.  Continue taking Robaxin at home.  For breakthrough pain, you can take 1 Norco every 6 hours.  Use ice and heat alternating 20 minutes on, 20 minutes off.  Attempt the exercises and stretches daily as tolerated.  Please follow-up to establish care with a primary care provider.  You can also follow-up with the sports medicine doctor if your symptoms are not improving.  Please return the emergency department develop any new or worsening symptoms including complete numbness of your legs or groin, loss of bowel or bladder control, fever 100.4, or any other concerning symptoms.  Do not drink alcohol, drive, operate machinery or participate in any other potentially dangerous activities while taking opiate pain medication as it may make you sleepy. Do not take this medication with any other sedating medications, either prescription or over-the-counter. If you were prescribed Percocet or Vicodin, do not take these with acetaminophen (Tylenol) as it is already contained within these medications and overdose of Tylenol is dangerous.   This medication is an opiate (or narcotic) pain medication and can be habit forming.  Use it as little as possible to achieve adequate pain control.  Do not use or use it with extreme caution if you have a history of opiate abuse or dependence. This medication is intended for your use only - do not give any to anyone else and keep it in a secure place where nobody else, especially children, have access to it. It will also cause or worsen constipation, so you may want to consider taking an over-the-counter stool softener while you are taking this medication.

## 2019-01-11 NOTE — ED Provider Notes (Signed)
[facility] EMERGENCY DEPARTMENT Provider Note   CSN: [id number] Arrival date & time: 01/10/19  1953     History   Chief Complaint Chief Complaint  Patient presents with  . Back Pain    HPI Meagan Flores is a 53 y.o. female with history of stroke, hypothyroidism, hyperlipidemia presents with a 4 to 5-day history of low back pain.  Patient was seen in urgent care for left-sided low back pain radiating down her leg 2 days ago and was given Robaxin and meloxicam.  She reports the left side has improved, but now the right side is hurting her and it is radiating down the back of her leg.  She denies any numbness or tingling, saddle anesthesia, loss of bowel or bladder incontinence, fevers, history of IVDU, cancer, history of procedure to back.  Patient reports the medications are not helping her at all.  She is not able to sleep at night.  She reports a couple weeks ago she felt something pop at work.  She works at [employer] and does a lot of lifting pushing and pulling.  She denies any urinary symptoms.  HPI  Past Medical History:  Diagnosis Date  . Hyperlipidemia   . Hypothyroidism   . Prediabetes   . Stroke ([place])   . Thyroid disease     Patient Active Problem List   Diagnosis Date Noted  . TIA (transient ischemic attack) 04/15/2018  . Cerebrovascular accident (CVA) due to thrombosis of right posterior cerebral artery ([place])   . Mixed hyperlipidemia   . Essential hypertension   . Smoker   . Bilateral shoulder pain 09/08/2017  . Thyroid disease 07/04/2017  . Tear of right rotator cuff   . Nontraumatic incomplete tear of right rotator cuff 12/24/2016  . Impingement syndrome of right shoulder 12/24/2016  . Right shoulder pain 01/24/2015  . Glenohumeral arthritis 12/27/2014    Past Surgical History:  Procedure Laterality Date  . PARTIAL HYSTERECTOMY    . right arm sx    . SHOULDER ARTHROSCOPY WITH DISTAL CLAVICLE RESECTION Right 01/12/2017   Procedure: RIGHT SHOULDER ARTHROSCOPY WITH  SUBACROMIAL DECOMPRESSION, DISTAL CLAVICLE EXCISION, extensive debridement;  Surgeon: Leandrew Koyanagi, MD;  Location: [facility];  Service: Orthopedics;  Laterality: Right;  . THYMECTOMY  2004  . THYROIDECTOMY       OB History    Gravida  3   Para  3   Term  3   Preterm  0   AB  0   Living  3     SAB  0   TAB  0   Ectopic  0   Multiple  0   Live Births               Home Medications    Prior to Admission medications   Medication Sig Start Date End Date Taking? Authorizing Provider  aspirin 81 MG tablet Take 1 tablet (81 mg total) by mouth daily. 04/16/18   Mariel Aloe, MD  atorvastatin (LIPITOR) 40 MG tablet Take 1 tablet (40 mg total) by mouth daily at 6 PM. 05/12/18   Gildardo Pounds, NP  clobetasol ointment (TEMOVATE) 0.05 % Apply topically 2 (two) times daily. Use for up to 2 weeks or until rash improved. 08/08/18   Gildardo Pounds, NP  diclofenac sodium (VOLTAREN) 1 % GEL Apply 4 g topically 4 (four) times daily. 01/10/19   Frederica Kuster, PA-C  HYDROcodone-acetaminophen (NORCO/VICODIN) 5-325 MG  tablet Take 1 tablet by mouth every 6 (six) hours as needed for severe pain. 01/10/19   Jesscia Imm, Bea Graff, PA-C  levothyroxine (SYNTHROID, LEVOTHROID) 125 MCG tablet Take 1 tablet (125 mcg total) by mouth daily before breakfast. 07/20/17   Alfonse Spruce, FNP  meloxicam (MOBIC) 7.5 MG tablet Take 1 tablet (7.5 mg total) by mouth daily. 01/08/19   Tasia Catchings, Amy V, PA-C  methocarbamol (ROBAXIN) 500 MG tablet Take 1 tablet (500 mg total) by mouth 2 (two) times daily. 01/08/19   Tasia Catchings, Amy V, PA-C  methylPREDNISolone (MEDROL DOSEPAK) 4 MG TBPK tablet Take as directed on package until completed. 01/10/19   Shevawn Langenberg, Bea Graff, PA-C  mupirocin ointment (BACTROBAN) 2 % Apply 1 application topically 2 (two) times daily. 01/08/19   Tasia Catchings, Amy V, PA-C  topiramate (TOPAMAX) 100 MG tablet Take 1 tablet (100 mg total) by mouth daily. 06/12/18  07/12/18  Gildardo Pounds, NP    Family History Family History  Problem Relation Age of Onset  . Stomach cancer Sister   . Colon polyps Sister   . Esophageal cancer Neg Hx   . Rectal cancer Neg Hx   . Colon cancer Neg Hx   . Breast cancer Neg Hx     Social History Social History   Tobacco Use  . Smoking status: Current Every Day Smoker    Packs/day: 0.25    Years: 34.00    Pack years: 8.50  . Smokeless tobacco: Never Used  Substance Use Topics  . Alcohol use: Yes    Comment: occassional  . Drug use: No     Allergies   Sulfa antibiotics   Review of Systems Review of Systems  Constitutional: Negative for fever.  Genitourinary: Negative for dysuria.  Musculoskeletal: Negative for back pain and neck pain.  Neurological: Negative for numbness.     Physical Exam Updated Vital Signs BP (!) 148/96 (BP Location: Right Arm)   Pulse 67   Temp 98.6 F (37 C) (Oral)   Resp 16   Ht 5\' 8"  (1.727 m)   Wt 93 kg   SpO2 97%   BMI 31.17 kg/m   Physical Exam Vitals signs and nursing note reviewed.  Constitutional:      General: She is not in acute distress.    Appearance: She is well-developed. She is not diaphoretic.  HENT:     Head: Normocephalic and atraumatic.     Mouth/Throat:     Pharynx: No oropharyngeal exudate.  Eyes:     General: No scleral icterus.       Right eye: No discharge.        Left eye: No discharge.     Conjunctiva/sclera: Conjunctivae normal.     Pupils: Pupils are equal, round, and reactive to light.  Neck:     Musculoskeletal: Normal range of motion and neck supple.     Thyroid: No thyromegaly.  Cardiovascular:     Rate and Rhythm: Normal rate and regular rhythm.     Heart sounds: Normal heart sounds. No murmur. No friction rub. No gallop.   Pulmonary:     Effort: Pulmonary effort is normal. No respiratory distress.     Breath sounds: Normal breath sounds. No stridor. No wheezing or rales.  Abdominal:     General: Bowel sounds are  normal. There is no distension.     Palpations: Abdomen is soft.     Tenderness: There is no abdominal tenderness. There is no guarding or rebound.  Musculoskeletal:  Comments: No midline cervical, thoracic, or lumbar tenderness Right-sided lumbar paraspinal tenderness and spasm, tenderness extends into the right buttock  Lymphadenopathy:     Cervical: No cervical adenopathy.  Skin:    General: Skin is warm and dry.     Coloration: Skin is not pale.     Findings: No rash.  Neurological:     Mental Status: She is alert.     Coordination: Coordination normal.     Comments: 5/5 strength to bilateral lower extremities, sensation intact, patellar reflexes intact      ED Treatments / Results  Labs (all labs ordered are listed, but only abnormal results are displayed) Labs Reviewed - No data to display  EKG None  Radiology No results found.  Procedures Procedures (including critical care time)  Medications Ordered in ED Medications  morphine 4 MG/ML injection 6 mg (6 mg Intramuscular Given 01/10/19 2228)     Initial Impression / Assessment and Plan / ED Course  I have reviewed the triage vital signs and the nursing notes.  Pertinent labs & imaging results that were available during my care of the patient were reviewed by me and considered in my medical decision making (see chart for details).     Patient presenting with suspected sciatica on the right side.  Her symptoms are worse with movement.  No neurological deficits and normal neuro exam.  Patient is ambulatory.  No loss of bowel or bladder control.  No concern for cauda equina.  No fever, night sweats, weight loss, h/o cancer, IVDA, no recent procedure to back. No urinary symptoms suggestive of UTI.  Patient given IM morphine in the ED with some relief.  Patient will be discharged home with Medrol Dosepak, Voltaren gel, and short course of Norco to help with sleep.  She is advised to continue taking Robaxin, but to  stop Mobic while taking the Medrol Dosepak.  I recommend ice, heat, stretching and exercise.  Patient given follow-up to sports medicine and advised to establish care with a PCP.  Return precautions discussed.  Patient understands and agrees with plan.  Patient vital stable throughout ED course and discharged in satisfactory condition.   Final Clinical Impressions(s) / ED Diagnoses   Final diagnoses:  Acute right-sided low back pain with right-sided sciatica    ED Discharge Orders         Ordered    methylPREDNISolone (MEDROL DOSEPAK) 4 MG TBPK tablet     01/10/19 2253    HYDROcodone-acetaminophen (NORCO/VICODIN) 5-325 MG tablet  Every 6 hours PRN     01/10/19 2253    diclofenac sodium (VOLTAREN) 1 % GEL  4 times daily     01/10/19 2253           Frederica Kuster, PA-C 01/11/19 1050    Duffy Bruce, MD 01/11/19 1123

## 2019-01-22 ENCOUNTER — Encounter (HOSPITAL_COMMUNITY): Payer: Self-pay | Admitting: Emergency Medicine

## 2019-01-22 ENCOUNTER — Ambulatory Visit (HOSPITAL_COMMUNITY)
Admission: EM | Admit: 2019-01-22 | Discharge: 2019-01-22 | Disposition: A | Discharge status: Home / Self Care | Payer: BLUE CROSS/BLUE SHIELD | Attending: Family Medicine | Admitting: Family Medicine

## 2019-01-22 DIAGNOSIS — K0889 Other specified disorders of teeth and supporting structures: Secondary | ICD-10-CM | POA: Diagnosis not present

## 2019-01-22 MED ORDER — CEFDINIR 300 MG PO CAPS: 600.0000 mg | ORAL_CAPSULE | Freq: Every day | ORAL | 0 refills | Status: DC

## 2019-01-22 MED ORDER — HYDROCODONE-ACETAMINOPHEN 5-325 MG PO TABS: 1.0000 | ORAL_TABLET | Freq: Four times a day (QID) | ORAL | 0 refills | Status: DC | PRN

## 2019-01-22 NOTE — ED Triage Notes (Signed)
Pt states she woke up Friday with tooth pain.

## 2019-01-22 NOTE — ED Provider Notes (Signed)
[facility]    CSN: [id number] Arrival date & time: 01/22/19  1637     History   Chief Complaint Chief Complaint  Patient presents with  . Dental Pain    HPI Meagan Flores is a 53 y.o. female.   This is an established [facility] patient, 52 year old woman, who presents with dental pain for the last 3 days.   Pt states she woke up Friday with tooth pain.        Past Medical History:  Diagnosis Date  . Hyperlipidemia   . Hypothyroidism   . Prediabetes   . Stroke ([place])   . Thyroid disease     Patient Active Problem List   Diagnosis Date Noted  . TIA (transient ischemic attack) 04/15/2018  . Cerebrovascular accident (CVA) due to thrombosis of right posterior cerebral artery ([place])   . Mixed hyperlipidemia   . Essential hypertension   . Smoker   . Bilateral shoulder pain 09/08/2017  . Thyroid disease 07/04/2017  . Tear of right rotator cuff   . Nontraumatic incomplete tear of right rotator cuff 12/24/2016  . Impingement syndrome of right shoulder 12/24/2016  . Right shoulder pain 01/24/2015  . Glenohumeral arthritis 12/27/2014    Past Surgical History:  Procedure Laterality Date  . PARTIAL HYSTERECTOMY    . right arm sx    . SHOULDER ARTHROSCOPY WITH DISTAL CLAVICLE RESECTION Right 01/12/2017   Procedure: RIGHT SHOULDER ARTHROSCOPY WITH  SUBACROMIAL DECOMPRESSION, DISTAL CLAVICLE EXCISION, extensive debridement;  Surgeon: Leandrew Koyanagi, MD;  Location: [facility];  Service: Orthopedics;  Laterality: Right;  . THYMECTOMY  2004  . THYROIDECTOMY      OB History    Gravida  3   Para  3   Term  3   Preterm  0   AB  0   Living  3     SAB  0   TAB  0   Ectopic  0   Multiple  0   Live Births               Home Medications    Prior to Admission medications   Medication Sig Start Date End Date Taking? Authorizing Provider  aspirin 81 MG tablet Take 1 tablet (81 mg total) by mouth daily. 04/16/18    Mariel Aloe, MD  atorvastatin (LIPITOR) 40 MG tablet Take 1 tablet (40 mg total) by mouth daily at 6 PM. 05/12/18   Gildardo Pounds, NP  cefdinir (OMNICEF) 300 MG capsule Take 2 capsules (600 mg total) by mouth daily. 01/22/19   Robyn Haber, MD  clobetasol ointment (TEMOVATE) 0.05 % Apply topically 2 (two) times daily. Use for up to 2 weeks or until rash improved. 08/08/18   Gildardo Pounds, NP  HYDROcodone-acetaminophen (NORCO) 5-325 MG tablet Take 1 tablet by mouth every 6 (six) hours as needed for moderate pain. 01/22/19   Robyn Haber, MD  levothyroxine (SYNTHROID, LEVOTHROID) 125 MCG tablet Take 1 tablet (125 mcg total) by mouth daily before breakfast. 07/20/17   Alfonse Spruce, FNP  mupirocin ointment (BACTROBAN) 2 % Apply 1 application topically 2 (two) times daily. 01/08/19   Tasia Catchings, Amy V, PA-C  topiramate (TOPAMAX) 100 MG tablet Take 1 tablet (100 mg total) by mouth daily. 06/12/18 07/12/18  Gildardo Pounds, NP    Family History Family History  Problem Relation Age of Onset  . Stomach cancer Sister   . Colon polyps Sister   .  Esophageal cancer Neg Hx   . Rectal cancer Neg Hx   . Colon cancer Neg Hx   . Breast cancer Neg Hx     Social History Social History   Tobacco Use  . Smoking status: Current Every Day Smoker    Packs/day: 0.25    Years: 34.00    Pack years: 8.50  . Smokeless tobacco: Never Used  Substance Use Topics  . Alcohol use: Yes    Comment: occassional  . Drug use: No     Allergies   Sulfa antibiotics   Review of Systems Review of Systems  HENT: Positive for dental problem.      Physical Exam Triage Vital Signs ED Triage Vitals  Enc Vitals Group     BP 01/22/19 1653 (!) 144/95     Pulse Rate 01/22/19 1653 88     Resp 01/22/19 1653 18     Temp 01/22/19 1653 99.5 F (37.5 C)     Temp src --      SpO2 01/22/19 1653 98 %     Weight --      Height --      Head Circumference --      Peak Flow --      Pain Score 01/22/19 1655 8      Pain Loc --      Pain Edu? --      Excl. in [place]? --    No data found.  Updated Vital Signs BP (!) 144/95   Pulse 88   Temp 99.5 F (37.5 C)   Resp 18   SpO2 98%    Physical Exam Vitals signs and nursing note reviewed.  Constitutional:      Appearance: Normal appearance.  HENT:     Head: Normocephalic.     Mouth/Throat:     Mouth: Mucous membranes are moist.     Comments: Mild gum swelling and dental carie at tooth #2 Neck:     Musculoskeletal: Normal range of motion and neck supple.  Pulmonary:     Effort: Pulmonary effort is normal.  Musculoskeletal: Normal range of motion.  Lymphadenopathy:     Cervical: Cervical adenopathy present.  Skin:    General: Skin is warm.  Neurological:     General: No focal deficit present.     Mental Status: She is alert.      UC Treatments / Results  Labs (all labs ordered are listed, but only abnormal results are displayed) Labs Reviewed - No data to display  EKG None  Radiology No results found.  Procedures Procedures (including critical care time)  Medications Ordered in UC Medications - No data to display  Initial Impression / Assessment and Plan / UC Course  I have reviewed the triage vital signs and the nursing notes.  Pertinent labs & imaging results that were available during my care of the patient were reviewed by me and considered in my medical decision making (see chart for details).    Final Clinical Impressions(s) / UC Diagnoses   Final diagnoses:  Pain, dental   Discharge Instructions   None    ED Prescriptions    Medication Sig Dispense Auth. Provider   HYDROcodone-acetaminophen (NORCO) 5-325 MG tablet Take 1 tablet by mouth every 6 (six) hours as needed for moderate pain. 12 tablet Robyn Haber, MD   cefdinir (OMNICEF) 300 MG capsule Take 2 capsules (600 mg total) by mouth daily. 20 capsule Robyn Haber, MD     Controlled Substance Prescriptions [place]  Controlled Substance Registry  consulted? Not Applicable   Robyn Haber, MD 01/22/19 [phone]

## 2019-02-28 ENCOUNTER — Other Ambulatory Visit: Payer: Self-pay | Admitting: Thoracic Surgery (Cardiothoracic Vascular Surgery)

## 2019-02-28 DIAGNOSIS — I281 Aneurysm of pulmonary artery: Secondary | ICD-10-CM

## 2019-05-16 ENCOUNTER — Other Ambulatory Visit: Payer: BLUE CROSS/BLUE SHIELD

## 2019-05-22 ENCOUNTER — Other Ambulatory Visit: Payer: BLUE CROSS/BLUE SHIELD

## 2019-05-22 ENCOUNTER — Ambulatory Visit: Payer: Self-pay | Admitting: Thoracic Surgery (Cardiothoracic Vascular Surgery)

## 2019-08-24 ENCOUNTER — Ambulatory Visit: Payer: Self-pay | Admitting: Family Medicine

## 2020-02-05 ENCOUNTER — Emergency Department (HOSPITAL_COMMUNITY)
Admission: EM | Admit: 2020-02-05 | Discharge: 2020-02-05 | Disposition: A | Discharge status: Home / Self Care | Payer: Commercial Managed Care - PPO | Attending: Emergency Medicine | Admitting: Emergency Medicine

## 2020-02-05 ENCOUNTER — Emergency Department (HOSPITAL_COMMUNITY): Payer: Commercial Managed Care - PPO

## 2020-02-05 ENCOUNTER — Encounter (HOSPITAL_COMMUNITY): Payer: Self-pay | Admitting: Emergency Medicine

## 2020-02-05 ENCOUNTER — Other Ambulatory Visit: Payer: Self-pay

## 2020-02-05 DIAGNOSIS — I10 Essential (primary) hypertension: Secondary | ICD-10-CM | POA: Insufficient documentation

## 2020-02-05 DIAGNOSIS — U071 COVID-19: Secondary | ICD-10-CM | POA: Diagnosis not present

## 2020-02-05 DIAGNOSIS — Z79899 Other long term (current) drug therapy: Secondary | ICD-10-CM | POA: Diagnosis not present

## 2020-02-05 DIAGNOSIS — F17210 Nicotine dependence, cigarettes, uncomplicated: Secondary | ICD-10-CM | POA: Diagnosis not present

## 2020-02-05 DIAGNOSIS — Z7982 Long term (current) use of aspirin: Secondary | ICD-10-CM | POA: Diagnosis not present

## 2020-02-05 DIAGNOSIS — E039 Hypothyroidism, unspecified: Secondary | ICD-10-CM | POA: Insufficient documentation

## 2020-02-05 DIAGNOSIS — D649 Anemia, unspecified: Secondary | ICD-10-CM

## 2020-02-05 DIAGNOSIS — R531 Weakness: Secondary | ICD-10-CM | POA: Diagnosis present

## 2020-02-05 LAB — CBC
HCT: 38 % (ref 36.0–46.0)
Hemoglobin: 11.8 g/dL — ABNORMAL LOW (ref 12.0–15.0)
MCH: 23.5 pg — ABNORMAL LOW (ref 26.0–34.0)
MCHC: 31.1 g/dL (ref 30.0–36.0)
MCV: 75.5 fL — ABNORMAL LOW (ref 80.0–100.0)
Platelets: 218 10*3/uL (ref 150–400)
RBC: 5.03 MIL/uL (ref 3.87–5.11)
RDW: 15.3 % (ref 11.5–15.5)
WBC: 5.6 10*3/uL (ref 4.0–10.5)
nRBC: 0 % (ref 0.0–0.2)

## 2020-02-05 LAB — BASIC METABOLIC PANEL
Anion gap: 12 (ref 5–15)
BUN: 10 mg/dL (ref 6–20)
CO2: 24 mmol/L (ref 22–32)
Calcium: 8.4 mg/dL — ABNORMAL LOW (ref 8.9–10.3)
Chloride: 103 mmol/L (ref 98–111)
Creatinine, Ser: 0.79 mg/dL (ref 0.44–1.00)
GFR calc Af Amer: >60 mL/min (ref >60)
GFR calc non Af Amer: >60 mL/min (ref >60)
Glucose, Bld: 97 mg/dL (ref 70–99)
Potassium: 3.5 mmol/L (ref 3.5–5.1)
Sodium: 139 mmol/L (ref 135–145)

## 2020-02-05 LAB — I-STAT BETA HCG BLOOD, ED (MC, WL, AP ONLY): I-stat hCG, quantitative: <5 m[IU]/mL (ref <5)

## 2020-02-05 LAB — POC SARS CORONAVIRUS 2 AG -  ED: SARS Coronavirus 2 Ag: POSITIVE — AB

## 2020-02-05 MED ORDER — BENZONATATE 100 MG PO CAPS: 100.0000 mg | ORAL_CAPSULE | Freq: Three times a day (TID) | ORAL | 0 refills | Status: DC | PRN

## 2020-02-05 MED ORDER — FLUTICASONE PROPIONATE 50 MCG/ACT NA SUSP: 1.0000 | Freq: Every day | NASAL | 0 refills | Status: DC | PRN

## 2020-02-05 MED ORDER — NAPROXEN 500 MG PO TABS: 500.0000 mg | ORAL_TABLET | Freq: Two times a day (BID) | ORAL | 0 refills | Status: DC

## 2020-02-05 MED ORDER — NAPROXEN 250 MG PO TABS
500.0000 mg | ORAL_TABLET | Freq: Once | ORAL | Status: AC
  Administered 2020-02-05: 500 mg via ORAL
  Filled 2020-02-05: qty 2

## 2020-02-05 MED ORDER — ACETAMINOPHEN 325 MG PO TABS
650.0000 mg | ORAL_TABLET | Freq: Once | ORAL | Status: AC | PRN
  Administered 2020-02-05: 650 mg via ORAL

## 2020-02-05 MED ORDER — ONDANSETRON 4 MG PO TBDP: 4.0000 mg | ORAL_TABLET | Freq: Three times a day (TID) | ORAL | 0 refills | Status: DC | PRN

## 2020-02-05 NOTE — Discharge Instructions (Addendum)
You were seen in the emergency department today and found to have COVID-19.  Your labs show that you are mildly anemic and that your calcium was a bit low, have these rechecked by your primary care provider.  We suspect the COVID-19 is the cause of the majority of your symptoms.  We are sending you home with the following medicines: -Flonase: Use 1 spray per nostril daily as needed to help with nasal congestion -Naproxen is a nonsteroidal anti-inflammatory medication that will help with pain and swelling. Be sure to take this medication as prescribed with food, 1 pill every 12 hours,  It should be taken with food, as it can cause stomach upset, and more seriously, stomach bleeding. Do not take other nonsteroidal anti-inflammatory medications with this such as Advil, Motrin, Aleve, Mobic, Goodie Powder, or Motrin. -Zofran: Take every 8 hours as needed for nausea and vomiting -Tessalon: Take every 8 hours as needed for coughing. You make take Tylenol per over the counter dosing with these medications.   We have prescribed you new medication(s) today. Discuss the medications prescribed today with your pharmacist as they can have adverse effects and interactions with your other medicines including over the counter and prescribed medications. Seek medical evaluation if you start to experience new or abnormal symptoms after taking one of these medicines, seek care immediately if you start to experience difficulty breathing, feeling of your throat closing, facial swelling, or rash as these could be indications of a more serious allergic reaction  We are instructing patient's with COVID 19 or symptoms of COVID 19 to isolate themselves for 14 days. You may be able to discontinue self isolation if the following conditions are met:   Persons with COVID-19 who have symptoms and were directed to care for themselves at home may discontinue home isolation under the  following conditions: - It has been at least 7 days  have passed since symptoms first appeared. - AND at least 3 days (72 hours) have passed since recovery defined as resolution of fever without the use of fever-reducing medications and improvement in respiratory symptoms (e.g., cough, shortness of breath)  Please follow the below quarantine instructions.   Please follow up with primary care within 3-5 days for re-evaluation- call prior to going to the office to make them aware of your symptoms as some offices are altering their method of seeing patients with COVID 19 symptoms. Return to the ER for new or worsening symptoms including but not limited to increased work of breathing, chest pain, numbness, weakness, passing out, or any other concerns.       Person Under Monitoring Name: Meagan Flores  Location: [address] [place] [address]   Infection Prevention Recommendations for Individuals Confirmed to have, or Being Evaluated for, 2019 Novel Coronavirus (COVID-19) Infection Who Receive Care at Home  Individuals who are confirmed to have, or are being evaluated for, COVID-19 should follow the prevention steps below until a healthcare provider or local or state health department says they can return to normal activities.  Stay home except to get medical care You should restrict activities outside your home, except for getting medical care. Do not go to work, school, or public areas, and do not use public transportation or taxis.  Call ahead before visiting your doctor Before your medical appointment, call the healthcare provider and tell them that you have, or are being evaluated for, COVID-19 infection. This will help the healthcare provider's office take steps to keep other people from  getting infected. Ask your healthcare provider to call the local or state health department.  Monitor your symptoms Seek prompt medical attention if your illness is worsening (e.g., difficulty breathing). Before going to your  medical appointment, call the healthcare provider and tell them that you have, or are being evaluated for, COVID-19 infection. Ask your healthcare provider to call the local or state health department.  Wear a facemask You should wear a facemask that covers your nose and mouth when you are in the same room with other people and when you visit a healthcare provider. People who live with or visit you should also wear a facemask while they are in the same room with you.  Separate yourself from other people in your home As much as possible, you should stay in a different room from other people in your home. Also, you should use a separate bathroom, if available.  Avoid sharing household items You should not share dishes, drinking glasses, cups, eating utensils, towels, bedding, or other items with other people in your home. After using these items, you should wash them thoroughly with soap and water.  Cover your coughs and sneezes Cover your mouth and nose with a tissue when you cough or sneeze, or you can cough or sneeze into your sleeve. Throw used tissues in a lined trash can, and immediately wash your hands with soap and water for at least 20 seconds or use an alcohol-based hand rub.  Wash your [facility] your hands often and thoroughly with soap and water for at least 20 seconds. You can use an alcohol-based hand sanitizer if soap and water are not available and if your hands are not visibly dirty. Avoid touching your eyes, nose, and mouth with unwashed hands.   Prevention Steps for Caregivers and Household Members of Individuals Confirmed to have, or Being Evaluated for, COVID-19 Infection Being Cared for in the Home  If you live with, or provide care at home for, a person confirmed to have, or being evaluated for, COVID-19 infection please follow these guidelines to prevent infection:  Follow healthcare provider's instructions Make sure that you understand and can help the  patient follow any healthcare provider instructions for all care.  Provide for the patient's basic needs You should help the patient with basic needs in the home and provide support for getting groceries, prescriptions, and other personal needs.  Monitor the patient's symptoms If they are getting sicker, call his or her medical provider and tell them that the patient has, or is being evaluated for, COVID-19 infection. This will help the healthcare provider's office take steps to keep other people from getting infected. Ask the healthcare provider to call the local or state health department.  Limit the number of people who have contact with the patient If possible, have only one caregiver for the patient. Other household members should stay in another home or place of residence. If this is not possible, they should stay in another room, or be separated from the patient as much as possible. Use a separate bathroom, if available. Restrict visitors who do not have an essential need to be in the home.  Keep older adults, very young children, and other sick people away from the patient Keep older adults, very young children, and those who have compromised immune systems or chronic health conditions away from the patient. This includes people with chronic heart, lung, or kidney conditions, diabetes, and cancer.  Ensure good ventilation Make sure that shared spaces in the  home have good air flow, such as from an air conditioner or an opened window, weather permitting.  Wash your hands often Wash your hands often and thoroughly with soap and water for at least 20 seconds. You can use an alcohol based hand sanitizer if soap and water are not available and if your hands are not visibly dirty. Avoid touching your eyes, nose, and mouth with unwashed hands. Use disposable paper towels to dry your hands. If not available, use dedicated cloth towels and replace them when they become wet.  Wear a  facemask and gloves Wear a disposable facemask at all times in the room and gloves when you touch or have contact with the patient's blood, body fluids, and/or secretions or excretions, such as sweat, saliva, sputum, nasal mucus, vomit, urine, or feces.  Ensure the mask fits over your nose and mouth tightly, and do not touch it during use. Throw out disposable facemasks and gloves after using them. Do not reuse. Wash your hands immediately after removing your facemask and gloves. If your personal clothing becomes contaminated, carefully remove clothing and launder. Wash your hands after handling contaminated clothing. Place all used disposable facemasks, gloves, and other waste in a lined container before disposing them with other household waste. Remove gloves and wash your hands immediately after handling these items.  Do not share dishes, glasses, or other household items with the patient Avoid sharing household items. You should not share dishes, drinking glasses, cups, eating utensils, towels, bedding, or other items with a patient who is confirmed to have, or being evaluated for, COVID-19 infection. After the person uses these items, you should wash them thoroughly with soap and water.  Wash laundry thoroughly Immediately remove and wash clothes or bedding that have blood, body fluids, and/or secretions or excretions, such as sweat, saliva, sputum, nasal mucus, vomit, urine, or feces, on them. Wear gloves when handling laundry from the patient. Read and follow directions on labels of laundry or clothing items and detergent. In general, wash and dry with the warmest temperatures recommended on the label.  Clean all areas the individual has used often Clean all touchable surfaces, such as counters, tabletops, doorknobs, bathroom fixtures, toilets, phones, keyboards, tablets, and bedside tables, every day. Also, clean any surfaces that may have blood, body fluids, and/or secretions or excretions  on them. Wear gloves when cleaning surfaces the patient has come in contact with. Use a diluted bleach solution (e.g., dilute bleach with 1 part bleach and 10 parts water) or a household disinfectant with a label that says EPA-registered for coronaviruses. To make a bleach solution at home, add 1 tablespoon of bleach to 1 quart (4 cups) of water. For a larger supply, add  cup of bleach to 1 gallon (16 cups) of water. Read labels of cleaning products and follow recommendations provided on product labels. Labels contain instructions for safe and effective use of the cleaning product including precautions you should take when applying the product, such as wearing gloves or eye protection and making sure you have good ventilation during use of the product. Remove gloves and wash hands immediately after cleaning.  Monitor yourself for signs and symptoms of illness Caregivers and household members are considered close contacts, should monitor their health, and will be asked to limit movement outside of the home to the extent possible. Follow the monitoring steps for close contacts listed on the symptom monitoring form.   ? If you have additional questions, contact your local health department or call  the epidemiologist on call at [phone] (available 24/7). ? This guidance is subject to change. For the most up-to-date guidance from [facility], please refer to their website: [email or web address]

## 2020-02-05 NOTE — ED Provider Notes (Signed)
[facility] EMERGENCY DEPARTMENT Provider Note   CSN: [id number] Arrival date & time: 02/05/20  1546     History Chief Complaint  Patient presents with   Weakness    Meagan Flores is a 54 y.o. female with a history of hyperlipidemia, hypothyroidism, prior stroke, and hypertension who presents to the emergency department with complaints of general malaise since yesterday. Sxs include headache (gradual onset, steady progression, nasal congestion, ear pressure, dry cough, body aches, subjective fevers, chills, fatigue, nausea and mild dyspnea at times. No alleviating/aggravating factors. Tried motrin w/o relief. She reports that her daughter-in-law tested positive for COVID-19 today. Denies visual disturbance, neck stiffness, numbness, focal weakness, chest pain, abdominal pain, vomiting, or diarrhea.   HPI     Past Medical History:  Diagnosis Date   Hyperlipidemia    Hypothyroidism    Prediabetes    Stroke [facility])    Thyroid disease     Patient Active Problem List   Diagnosis Date Noted   TIA (transient ischemic attack) 04/15/2018   Cerebrovascular accident (CVA) due to thrombosis of right posterior cerebral artery ([place])    Mixed hyperlipidemia    Essential hypertension    Smoker    Bilateral shoulder pain 09/08/2017   Thyroid disease 07/04/2017   Tear of right rotator cuff    Nontraumatic incomplete tear of right rotator cuff 12/24/2016   Impingement syndrome of right shoulder 12/24/2016   Right shoulder pain 01/24/2015   Glenohumeral arthritis 12/27/2014    Past Surgical History:  Procedure Laterality Date   PARTIAL HYSTERECTOMY     right arm sx     SHOULDER ARTHROSCOPY WITH DISTAL CLAVICLE RESECTION Right 01/12/2017   Procedure: RIGHT SHOULDER ARTHROSCOPY WITH  SUBACROMIAL DECOMPRESSION, DISTAL CLAVICLE EXCISION, extensive debridement;  Surgeon: Leandrew Koyanagi, MD;  Location: [facility];  Service: Orthopedics;   Laterality: Right;   THYMECTOMY  2004   THYROIDECTOMY       OB History    Gravida  3   Para  3   Term  3   Preterm  0   AB  0   Living  3     SAB  0   TAB  0   Ectopic  0   Multiple  0   Live Births              Family History  Problem Relation Age of Onset   Stomach cancer Sister    Colon polyps Sister    Esophageal cancer Neg Hx    Rectal cancer Neg Hx    Colon cancer Neg Hx    Breast cancer Neg Hx     Social History   Tobacco Use   Smoking status: Current Every Day Smoker    Packs/day: 0.25    Years: 34.00    Pack years: 8.50   Smokeless tobacco: Never Used  Substance Use Topics   Alcohol use: Yes    Comment: occassional   Drug use: No    Home Medications Prior to Admission medications   Medication Sig Start Date End Date Taking? Authorizing Provider  aspirin 81 MG tablet Take 1 tablet (81 mg total) by mouth daily. 04/16/18   Mariel Aloe, MD  atorvastatin (LIPITOR) 40 MG tablet Take 1 tablet (40 mg total) by mouth daily at 6 PM. 05/12/18   Gildardo Pounds, NP  cefdinir (OMNICEF) 300 MG capsule Take 2 capsules (600 mg total) by mouth daily. 01/22/19   Robyn Haber,  MD  clobetasol ointment (TEMOVATE) 0.05 % Apply topically 2 (two) times daily. Use for up to 2 weeks or until rash improved. 08/08/18   Gildardo Pounds, NP  HYDROcodone-acetaminophen (NORCO) 5-325 MG tablet Take 1 tablet by mouth every 6 (six) hours as needed for moderate pain. 01/22/19   Robyn Haber, MD  levothyroxine (SYNTHROID, LEVOTHROID) 125 MCG tablet Take 1 tablet (125 mcg total) by mouth daily before breakfast. 07/20/17   Alfonse Spruce, FNP  mupirocin ointment (BACTROBAN) 2 % Apply 1 application topically 2 (two) times daily. 01/08/19   Tasia Catchings, Amy V, PA-C  topiramate (TOPAMAX) 100 MG tablet Take 1 tablet (100 mg total) by mouth daily. 06/12/18 07/12/18  Gildardo Pounds, NP    Allergies    Sulfa antibiotics  Review of Systems   Review of Systems    Constitutional: Positive for chills, fatigue and fever.  HENT: Positive for congestion and ear pain. Negative for sore throat.   Eyes: Negative for visual disturbance.  Respiratory: Positive for cough and shortness of breath.   Cardiovascular: Negative for chest pain.  Gastrointestinal: Positive for nausea. Negative for abdominal pain, diarrhea and vomiting.  Neurological: Positive for headaches. Negative for dizziness, tremors, seizures, syncope, speech difficulty, weakness (focal) and numbness.  All other systems reviewed and are negative.   Physical Exam Updated Vital Signs BP (!) 147/98    Pulse 99    Temp (!) 101.6 F (38.7 C) (Oral)    Resp 16    Ht 5\' 8"  (1.727 m)    SpO2 99%    BMI 31.17 kg/m   Physical Exam Vitals and nursing note reviewed.  Constitutional:      General: She is not in acute distress.    Appearance: Normal appearance. She is well-developed. She is not toxic-appearing.  HENT:     Head: Normocephalic and atraumatic.     Right Ear: Ear canal normal. Tympanic membrane is not perforated, erythematous, retracted or bulging.     Left Ear: Ear canal normal. Tympanic membrane is not perforated, erythematous, retracted or bulging.     Ears:     Comments: No mastoid erythema/swelling/tenderness.     Nose:     Right Sinus: No maxillary sinus tenderness or frontal sinus tenderness.     Left Sinus: No maxillary sinus tenderness or frontal sinus tenderness.     Mouth/Throat:     Pharynx: Oropharynx is clear. Uvula midline. No oropharyngeal exudate or posterior oropharyngeal erythema.     Comments: Posterior oropharynx is symmetric appearing. Patient tolerating own secretions without difficulty. No trismus. No drooling. No hot potato voice. No swelling beneath the tongue, submandibular compartment is soft.  Eyes:     General: Vision grossly intact. Gaze aligned appropriately.        Right eye: No discharge.        Left eye: No discharge.     Extraocular Movements:  Extraocular movements intact.     Conjunctiva/sclera: Conjunctivae normal.     Pupils: Pupils are equal, round, and reactive to light.     Comments: No proptosis.   Cardiovascular:     Rate and Rhythm: Normal rate and regular rhythm.     Heart sounds: No murmur.  Pulmonary:     Effort: Pulmonary effort is normal. No respiratory distress.     Breath sounds: Normal breath sounds. No wheezing, rhonchi or rales.  Abdominal:     General: There is no distension.     Palpations: Abdomen is soft.  Tenderness: There is no abdominal tenderness. There is no guarding or rebound.  Musculoskeletal:     Cervical back: Normal range of motion and neck supple. No edema or rigidity.  Lymphadenopathy:     Cervical: No cervical adenopathy.  Skin:    General: Skin is warm and dry.     Findings: No rash.  Neurological:     Mental Status: She is alert.     Comments: Alert. Clear speech. No facial droop. CNIII-XII grossly intact. Bilateral upper and lower extremities' sensation grossly intact. 5/5 symmetric strength with grip strength and with plantar and dorsi flexion bilaterally . Normal finger to nose bilaterally. Negative pronator drift. Gait intact.    Psychiatric:        Mood and Affect: Mood normal.        Behavior: Behavior normal.     ED Results / Procedures / Treatments   Labs (all labs ordered are listed, but only abnormal results are displayed) Labs Reviewed  BASIC METABOLIC PANEL - Abnormal; Notable for the following components:      Result Value   Calcium 8.4 (*)    All other components within normal limits  CBC - Abnormal; Notable for the following components:   Hemoglobin 11.8 (*)    MCV 75.5 (*)    MCH 23.5 (*)    All other components within normal limits  POC SARS CORONAVIRUS 2 AG -  ED - Abnormal; Notable for the following components:   SARS Coronavirus 2 Ag POSITIVE (*)    All other components within normal limits  I-STAT BETA HCG BLOOD, ED ([facility], [facility], [facility] ONLY)     EKG None  Radiology DG Chest Portable 1 View  Result Date: 02/05/2020 CLINICAL DATA:  Cough and weakness EXAM: PORTABLE CHEST 1 VIEW COMPARISON:  04/15/2018 FINDINGS: Cardiac shadow is mildly enlarged but stable. Postsurgical changes are noted. The lungs are well aerated bilaterally. No focal infiltrate or sizable effusion is seen. No bony abnormality is noted. IMPRESSION: No active disease. Electronically Signed   By: Inez Catalina M.D.   On: 02/05/2020 19:51    Procedures Procedures (including critical care time)  Medications Ordered in ED Medications  acetaminophen (TYLENOL) tablet 650 mg (650 mg Oral Given 02/05/20 1623)    ED Course  I have reviewed the triage vital signs and the nursing notes.  Pertinent labs & imaging results that were available during my care of the patient were reviewed by me and considered in my medical decision making (see chart for details).  PETRICE NAVIN was evaluated in Emergency Department on 02/05/2020 for the symptoms described in the history of present illness. He/she was evaluated in the context of the global COVID-19 pandemic, which necessitated consideration that the patient might be at risk for infection with the SARS-CoV-2 virus that causes COVID-19. Institutional protocols and algorithms that pertain to the evaluation of patients at risk for COVID-19 are in a state of rapid change based on information released by regulatory bodies including the CDC and federal and state organizations. These policies and algorithms were followed during the patient's care in the ED.    MDM Rules/Calculators/A&P                     Patient presents to the ED with complaints of general malaise with sxs as detailed above. Febrile on arrival, mildly hypertensive- doubt HTN emergency, vitals otherwise WNL. Patient nontoxic, resting comfortably.  Work-up per triage has been reviewed: CBC with mild anemia, PCP  recheck.  No leukocytosis.  No significant electrolyte  derangement, mild hypocalcemia.  Renal function preserved.  Pregnancy test is negative.  Patient is Covid positive. Nursing notes & chart reviewed for additional history.   No focal neurologic deficits, no sudden onset, no recent head injury-do not suspect acute ischemic or hemorrhagic CVA or SAH.  No meningismus.  No sinus tenderness, doubt acute bacterial sinusitis.  No signs of AOM/AOE/mastoiditis.  Oropharynx is clear.  Chest x-ray without infiltrate to suggest CAP- I personally reviewed and interpreted, I agree with radiologist impression.  No focal abdominal tenderness or peritoneal signs.  Overall reassuring exam and work-up in the ER.  SPO2 maintained greater than 95% @ rest & with ambulation.  Suspect symptoms most likely secondary to COVID-19.  Discussed need for isolation.  Will treat supportively. I discussed results, treatment plan, need for follow-up, and return precautions with the patient. Provided opportunity for questions, patient confirmed understanding and is in agreement with plan.   Final Clinical Impression(s) / ED Diagnoses Final diagnoses:  COVID-19  Anemia, unspecified type    Rx / DC Orders ED Discharge Orders         Ordered    fluticasone (FLONASE) 50 MCG/ACT nasal spray  Daily PRN     02/05/20 2006    benzonatate (TESSALON) 100 MG capsule  3 times daily PRN     02/05/20 2006    naproxen (NAPROSYN) 500 MG tablet  2 times daily     02/05/20 2006    ondansetron (ZOFRAN ODT) 4 MG disintegrating tablet  Every 8 hours PRN     02/05/20 [address], PA-C 02/05/20 2025    Quintella Reichert, MD 02/06/20 1447

## 2020-02-05 NOTE — ED Triage Notes (Signed)
Pt endorses weakness, headache, body aches since yesterday. Reports daughter in law tested positive for covid today.

## 2020-02-06 ENCOUNTER — Telehealth: Payer: Self-pay | Admitting: Unknown Physician Specialty

## 2020-02-06 NOTE — Telephone Encounter (Signed)
Called to discuss with [name] about Covid symptoms and the use of bamlanivimab, a monoclonal antibody infusion for those with mild to moderate Covid symptoms and at a high risk of hospitalization.     Pt is qualified for this infusion at the [facility] infusion center due to co-morbid conditions and/or a member of an at-risk group, however declines infusion at this time. Symptoms tier reviewed as well as criteria for ending isolation.  Symptoms reviewed that would warrant ED/Hospital evaluation. Preventative practices reviewed. Patient verbalized understanding. Patient advised to call back if he decides that he does want to get infusion. Callback number to the infusion center given. Patient advised to go to Urgent care or ED with severe symptoms.   Patient Active Problem List   Diagnosis Date Noted  . TIA (transient ischemic attack) 04/15/2018  . Cerebrovascular accident (CVA) due to thrombosis of right posterior cerebral artery ([place])   . Mixed hyperlipidemia   . Essential hypertension   . Smoker   . Bilateral shoulder pain 09/08/2017  . Thyroid disease 07/04/2017  . Tear of right rotator cuff   . Nontraumatic incomplete tear of right rotator cuff 12/24/2016  . Impingement syndrome of right shoulder 12/24/2016  . Right shoulder pain 01/24/2015  . Glenohumeral arthritis 12/27/2014

## 2020-02-14 ENCOUNTER — Other Ambulatory Visit: Payer: Self-pay

## 2020-02-14 ENCOUNTER — Encounter: Payer: Self-pay | Admitting: Critical Care Medicine

## 2020-02-14 ENCOUNTER — Ambulatory Visit: Payer: Commercial Managed Care - PPO | Attending: Critical Care Medicine | Admitting: Critical Care Medicine

## 2020-02-14 DIAGNOSIS — E039 Hypothyroidism, unspecified: Secondary | ICD-10-CM

## 2020-02-14 DIAGNOSIS — Z8673 Personal history of transient ischemic attack (TIA), and cerebral infarction without residual deficits: Secondary | ICD-10-CM

## 2020-02-14 DIAGNOSIS — U071 COVID-19: Secondary | ICD-10-CM | POA: Insufficient documentation

## 2020-02-14 DIAGNOSIS — I10 Essential (primary) hypertension: Secondary | ICD-10-CM

## 2020-02-14 DIAGNOSIS — E782 Mixed hyperlipidemia: Secondary | ICD-10-CM

## 2020-02-14 DIAGNOSIS — F17200 Nicotine dependence, unspecified, uncomplicated: Secondary | ICD-10-CM

## 2020-02-14 DIAGNOSIS — Z720 Tobacco use: Secondary | ICD-10-CM

## 2020-02-14 DIAGNOSIS — E890 Postprocedural hypothyroidism: Secondary | ICD-10-CM

## 2020-02-14 HISTORY — DX: Personal history of transient ischemic attack (TIA), and cerebral infarction without residual deficits: Z86.73

## 2020-02-14 HISTORY — DX: COVID-19: U07.1

## 2020-02-14 MED ORDER — ONDANSETRON 4 MG PO TBDP: 4.0000 mg | ORAL_TABLET | Freq: Three times a day (TID) | ORAL | 0 refills | Status: DC | PRN

## 2020-02-14 MED ORDER — ATORVASTATIN CALCIUM 40 MG PO TABS: 40.0000 mg | ORAL_TABLET | Freq: Every day | ORAL | 1 refills | Status: DC

## 2020-02-14 MED ORDER — ASPIRIN EC 81 MG PO TBEC: 81.0000 mg | DELAYED_RELEASE_TABLET | Freq: Every day | ORAL | 3 refills | Status: DC

## 2020-02-14 MED ORDER — LEVOTHYROXINE SODIUM 125 MCG PO TABS: 125.0000 ug | ORAL_TABLET | Freq: Every day | ORAL | 2 refills | Status: DC

## 2020-02-14 MED FILL — ATORVASTATIN CALCIUM 40 MG: 40 | 30 days supply | Qty: 30 | Fill #0

## 2020-02-14 MED FILL — ONDANSETRON ODT 4 MG TABLET: 4 | 1 days supply | Qty: 4 | Fill #0

## 2020-02-14 MED FILL — LEVOTHYROXINE 125 MCG TAB: 125 | 30 days supply | Qty: 30 | Fill #0

## 2020-02-14 NOTE — Progress Notes (Signed)
Subjective:    Patient ID: Meagan Flores, female    DOB: 08-Nov-1966, 54 y.o.   MRN: [id number] Virtual Visit via Telephone Note  I connected with Meagan Flores on 02/14/20 at  9:30 AM EDT by telephone and verified that I am speaking with the correct person using two identifiers.   Consent:  I discussed the limitations, risks, security and privacy concerns of performing an evaluation and management service by telephone and the availability of in person appointments. I also discussed with the patient that there may be a patient responsible charge related to this service. The patient expressed understanding and agreed to proceed.  Location of patient: The patient is at home Location of provider: I am in office  Persons participating in the televisit with the patient.   No one else on the call    History of Present Illness: This is a 54 year old female who was in the emergency department on 9 March with symptoms consistent with viral infection.  The patient initially came in with general malaise fever muscle aches and pain headaches and dizziness loss of taste and smell Covid test was positive the patient was not hypoxic chest x-ray did not show infiltrates patient was given supportive care fluid hydration and then discharged later in the day.  This is a post ER follow-up visit and also to establish in the clinic as the patient does not have a primary care provider.  This patient states she is improving over the last 24 hours.  Previously over the last several days she had episodes of diarrhea nausea and vomiting without abdominal pain.  She still lost her sense of taste.  She states that she has had cough is paroxysmal but her shortness of breath is resolved urine output is adequate urine is not concentrated.  She states her muscle aches and pains are resolving she still has some pain in the back and hurts some in the legs she also still has some fatigue.  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  Former smoker now only smoking the occasional amounts.  Previous history of stroke and had been on aspirin 81 mg daily and lipid therapy.  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  The only active medicine she has at this time includes over-the-counter zinc and vitamin C and she also has the Synthroid 125 mcg daily.   Past Medical History:  Diagnosis Date  . Hyperlipidemia   . Hypothyroidism   . Prediabetes   . Stroke ([place])   . Thyroid disease   . TIA (transient ischemic attack) 04/15/2018     Family History  Problem Relation Age of Onset  . Stomach cancer Sister   . Colon polyps Sister   . Esophageal cancer Neg Hx   . Rectal cancer Neg Hx   . Colon cancer Neg Hx   . Breast cancer Neg Hx      Social History   Socioeconomic History  . Marital status: Married    Spouse name: Not on file  . Number of children: Not on file  . Years of education: Not on file  . Highest education level: Not on file  Occupational History  . Not on file  Tobacco Use  . Smoking status: Current Every Day Smoker    Packs/day: 0.25    Years: 34.00    Pack  years: 8.50  . Smokeless tobacco: Never Used  Substance and Sexual Activity  . Alcohol use: Yes    Comment: occassional  . Drug use: No  . Sexual activity: Yes    Birth control/protection: Surgical  Other Topics Concern  . Not on file  Social History Narrative  . Not on file   Social Determinants of Health   Financial Resource Strain:   . Difficulty of Paying Living Expenses:   Food Insecurity:   . Worried About Charity fundraiser in the Last Year:   . Arboriculturist in the Last Year:   Transportation Needs:   . Film/video editor (Medical):   Marland Kitchen Lack of Transportation (Non-Medical):   Physical Activity:   . Days of Exercise per Week:   . Minutes of Exercise per Session:   Stress:   .  Feeling of Stress :   Social Connections:   . Frequency of Communication with Friends and Family:   . Frequency of Social Gatherings with Friends and Family:   . Attends Religious Services:   . Active Member of Clubs or Organizations:   . Attends Archivist Meetings:   Marland Kitchen Marital Status:   Intimate Partner Violence:   . Fear of Current or Ex-Partner:   . Emotionally Abused:   Marland Kitchen Physically Abused:   . Sexually Abused:      Allergies  Allergen Reactions  . Sulfa Antibiotics Hives     Outpatient Medications Prior to Visit  Medication Sig Dispense Refill  . benzonatate (TESSALON) 100 MG capsule Take 1 capsule (100 mg total) by mouth 3 (three) times daily as needed for cough. 21 capsule 0  . fluticasone (FLONASE) 50 MCG/ACT nasal spray Place 1 spray into both nostrils daily as needed for allergies or rhinitis. 16 g 0  . mupirocin ointment (BACTROBAN) 2 % Apply 1 application topically 2 (two) times daily. 22 g 0  . aspirin 81 MG tablet Take 1 tablet (81 mg total) by mouth daily. 30 tablet 0  . atorvastatin (LIPITOR) 40 MG tablet Take 1 tablet (40 mg total) by mouth daily at 6 PM. 90 tablet 1  . cefdinir (OMNICEF) 300 MG capsule Take 2 capsules (600 mg total) by mouth daily. 20 capsule 0  . clobetasol ointment (TEMOVATE) 0.05 % Apply topically 2 (two) times daily. Use for up to 2 weeks or until rash improved. 30 g 0  . levothyroxine (SYNTHROID, LEVOTHROID) 125 MCG tablet Take 1 tablet (125 mcg total) by mouth daily before breakfast. 30 tablet 2  . ondansetron (ZOFRAN ODT) 4 MG disintegrating tablet Take 1 tablet (4 mg total) by mouth every 8 (eight) hours as needed for nausea or vomiting. 5 tablet 0  . HYDROcodone-acetaminophen (NORCO) 5-325 MG tablet Take 1 tablet by mouth every 6 (six) hours as needed for moderate pain. (Patient not taking: Reported on 02/14/2020) 12 tablet 0  . naproxen (NAPROSYN) 500 MG tablet Take 1 tablet (500 mg total) by mouth 2 (two) times daily. (Patient  not taking: Reported on 02/14/2020) 10 tablet 0  . topiramate (TOPAMAX) 100 MG tablet Take 1 tablet (100 mg total) by mouth daily. 30 tablet 2   No facility-administered medications prior to visit.      Review of Systems Constitutional:    weight loss, night sweats,  Fevers, chills, fatigue, lassitude. HEENT:   headaches,  Difficulty swallowing,  Tooth/dental problems,  Sore throat,  No sneezing, itching, ear ache, nasal congestion, post nasal drip,   CV:  No chest pain,  Orthopnea, PND, swelling in lower extremities, anasarca, dizziness, palpitations  GI  No heartburn, indigestion, abdominal pain, nausea, vomiting, diarrhea, change in bowel habits, loss of appetite  Resp: No shortness of breath with exertion or at rest.  No excess mucus, no productive cough,   non-productive cough,  No coughing up of blood.  No change in color of mucus.  No wheezing.  No chest wall deformity  Skin: no rash or lesions.  GU: no dysuria, change in color of urine, no urgency or frequency.  No flank pain.  MS:   joint pain or swelling.  No decreased range of motion.  No back pain.  Psych:  No change in mood or affect. No depression or anxiety.  No memory loss.      Objective:   Physical Exam No exam this is a phone visit       Assessment & Plan:  I personally reviewed all images and lab data in the [facility] system as well as any outside material available during this office visit and agree with the  radiology impressions.   COVID-19 virus infection COVID-19 viral infection now resolving and the patient is on day 10 of isolation.  The patient has not had fever the last 3 days and her respiratory complaints are rapidly improving.  Indicated to the patient she could be out of isolation tomorrow on the 19th.  I did indicate to the patient that she should not return to work till March 22.  Furthermore she does not need repeat testing for Covid as this is no longer recommended.  I will prepare a  letter to release her to work so she can give this to her employer.  I recommended she maintain vitamin C and zinc at this time.  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 will follow-up with thyroid function when she comes to the office for now I have refilled Synthroid 125 mcg daily  History of stroke History of stroke involving posterior circulation  Plan will be to continue aspirin and atorvastatin  Tobacco use History of active tobacco use I advocated she pursue complete smoking cessation  Essential hypertension History of hypertension and note elevated blood pressures when she was in the ER but not on current medications  Plan will be for this patient to have follow-up blood pressure readings when she comes into the office and we will make an assessment as to whether she needs to start medications at that time   Corlyn was seen today for hospitalization follow-up.  Diagnoses and all orders for this visit:  COVID-19 virus infection  History of TIA (transient ischemic attack) -     atorvastatin (LIPITOR) 40 MG tablet; Take 1 tablet (40 mg total) by mouth daily at 6 PM.  Hypothyroidism, unspecified type -     levothyroxine (SYNTHROID) 125 MCG tablet; Take 1 tablet (125 mcg total) by mouth daily before breakfast.  Essential hypertension  Smoker  Mixed hyperlipidemia  Postoperative hypothyroidism  History of stroke  Tobacco use  Other orders -     aspirin EC 81 MG tablet; Take 1 tablet (81 mg total) by mouth daily. -     ondansetron (ZOFRAN ODT) 4 MG disintegrating tablet; Take 1 tablet (4 mg total) by mouth every 8 (eight)  hours as needed for nausea or vomiting.  Follow Up Instructions:   The patient knows an office visit will be made after April 10 when she can come into the office status post Covid and as  well she knows that refills on aspirin, atorvastatin, and her Synthroid along with Zofran will be given to the pharmacy and she knows labs will be obtained when she comes to the office  I discussed the assessment and treatment plan with the patient. The patient was provided an opportunity to ask questions and all were answered. The patient agreed with the plan and demonstrated an understanding of the instructions.   The patient was advised to call back or seek an in-person evaluation if the symptoms worsen or if the condition fails to improve as anticipated.  I provided 30 minutes of non-face-to-face time during this encounter  including  median intraservice time , review of notes, labs, imaging, medications  and explaining diagnosis and management to the patient .    Asencion Noble, MD

## 2020-02-14 NOTE — Assessment & Plan Note (Signed)
History of stroke involving posterior circulation  Plan will be to continue aspirin and atorvastatin

## 2020-02-14 NOTE — Assessment & Plan Note (Signed)
COVID-19 viral infection now resolving and the patient is on day 10 of isolation.  The patient has not had fever the last 3 days and her respiratory complaints are rapidly improving.  Indicated to the patient she could be out of isolation tomorrow on the 19th.  I did indicate to the patient that she should not return to work till March 22.  Furthermore she does not need repeat testing for Covid as this is no longer recommended.  I will prepare a letter to release her to work so she can give this to her employer.  I recommended she maintain vitamin C and zinc at this time.  Also gave her recommendations and how to maintain and clean her bathroom area after and veterinary after being in there for Covid

## 2020-02-14 NOTE — Assessment & Plan Note (Signed)
History of postoperative hypothyroidism will follow-up with thyroid function when she comes to the office for now I have refilled Synthroid 125 mcg daily

## 2020-02-14 NOTE — Assessment & Plan Note (Signed)
History of hypertension and note elevated blood pressures when she was in the ER but not on current medications  Plan will be for this patient to have follow-up blood pressure readings when she comes into the office and we will make an assessment as to whether she needs to start medications at that time

## 2020-02-14 NOTE — Assessment & Plan Note (Signed)
History of active tobacco use I advocated she pursue complete smoking cessation

## 2020-03-17 NOTE — Progress Notes (Signed)
Subjective:    Patient ID: Meagan Flores, female    DOB: [birth year], 54 y.o.   MRN: [id number]  History of Present Illness: This is a 54 year old female who was in the emergency department on 9 March with symptoms consistent with viral infection.  The patient initially came in with general malaise fever muscle aches and pain headaches and dizziness loss of taste and smell Covid test was positive the patient was not hypoxic chest x-ray did not show infiltrates patient was given supportive care fluid hydration and then discharged later in the day.  This is a post ER follow-up visit and also to establish in the clinic as the patient does not have a primary care provider.  This patient states she is improving over the last 24 hours.  Previously over the last several days she had episodes of diarrhea nausea and vomiting without abdominal pain.  She still lost her sense of taste.  She states that she has had cough is paroxysmal but her shortness of breath is resolved urine output is adequate urine is not concentrated.  She states her muscle aches and pains are resolving she still has some pain in the back and hurts some in the legs she also still has some fatigue.  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  Former smoker now only smoking the occasional amounts.  Previous history of stroke and had been on aspirin 81 mg daily and lipid therapy.  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  The only active medicine she has at this time includes over-the-counter zinc and vitamin C and she also has the Synthroid 125 mcg daily.  4/20 This patient returns today in follow-up after the first visit being a telehealth visit.  The patient did have Covid viral infection and is rapidly improved from this.  She still has some fatigue.  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  She is smoking now pack of cigarettes every 3 days.  Note the patient comes in today with adequate blood pressure and not requiring blood pressure medications.  The patient is on Synthroid 125 mcg daily without symptoms of hypothyroidism at this time.  Also is on aspirin and atorvastatin for stroke prevention  This patient has suffered lost during the pandemic and that her son was murdered in January she lost both her parents several months apart late in 2020 she is suffering grief reaction from these losses  Past Medical History:  Diagnosis Date  . COVID-19 virus infection 02/14/2020  . Hyperlipidemia   . Hypothyroidism   . Prediabetes   . Stroke ([place])   . Thyroid disease   . TIA (transient ischemic attack) 04/15/2018     Family History  Problem Relation Age of Onset  . Stomach cancer Sister   . Colon polyps Sister   . Esophageal cancer Neg Hx   . Rectal cancer Neg Hx   . Colon cancer Neg Hx   . Breast cancer Neg Hx      Social History   Socioeconomic History  . Marital status: Married    Spouse name: Not on file  . Number of children: Not on file  . Years of education: Not on file  . Highest education level: Not on file  Occupational History  . Not on file  Tobacco  Use  . Smoking status: Current Every Day Smoker    Packs/day: 0.25    Years: 34.00    Pack years: 8.50  . Smokeless tobacco: Never Used  Substance and Sexual Activity  . Alcohol use: Yes    Comment: occassional  . Drug use: No  . Sexual activity: Yes    Birth control/protection: Surgical  Other Topics Concern  . Not on file  Social History Narrative  . Not on file   Social Determinants of Health   Financial Resource Strain:   . Difficulty of Paying Living Expenses:   Food Insecurity:   . Worried About Charity fundraiser in the Last Year:   . Arboriculturist in the Last Year:   Transportation Needs:   . Film/video editor (Medical):   Marland Kitchen Lack of Transportation  (Non-Medical):   Physical Activity:   . Days of Exercise per Week:   . Minutes of Exercise per Session:   Stress:   . Feeling of Stress :   Social Connections:   . Frequency of Communication with Friends and Family:   . Frequency of Social Gatherings with Friends and Family:   . Attends Religious Services:   . Active Member of Clubs or Organizations:   . Attends Archivist Meetings:   Marland Kitchen Marital Status:   Intimate Partner Violence:   . Fear of Current or Ex-Partner:   . Emotionally Abused:   Marland Kitchen Physically Abused:   . Sexually Abused:      Allergies  Allergen Reactions  . Sulfa Antibiotics Hives     Outpatient Medications Prior to Visit  Medication Sig Dispense Refill  . aspirin EC 81 MG tablet Take 1 tablet (81 mg total) by mouth daily. 60 tablet 3  . atorvastatin (LIPITOR) 40 MG tablet Take 1 tablet (40 mg total) by mouth daily at 6 PM. 90 tablet 1  . levothyroxine (SYNTHROID) 125 MCG tablet Take 1 tablet (125 mcg total) by mouth daily before breakfast. 30 tablet 2  . benzonatate (TESSALON) 100 MG capsule Take 1 capsule (100 mg total) by mouth 3 (three) times daily as needed for cough. (Patient not taking: Reported on 03/18/2020) 21 capsule 0  . fluticasone (FLONASE) 50 MCG/ACT nasal spray Place 1 spray into both nostrils daily as needed for allergies or rhinitis. (Patient not taking: Reported on 03/18/2020) 16 g 0  . mupirocin ointment (BACTROBAN) 2 % Apply 1 application topically 2 (two) times daily. (Patient not taking: Reported on 03/18/2020) 22 g 0  . ondansetron (ZOFRAN ODT) 4 MG disintegrating tablet Take 1 tablet (4 mg total) by mouth every 8 (eight) hours as needed for nausea or vomiting. (Patient not taking: Reported on 03/18/2020) 5 tablet 0   No facility-administered medications prior to visit.      Review of Systems Constitutional:    weight loss, night sweats,  Fevers, chills, fatigue, lassitude. HEENT:   headaches,  Difficulty swallowing,  Tooth/dental  problems,  Sore throat,                No sneezing, itching, ear ache, nasal congestion, post nasal drip,   CV:  No chest pain,  Orthopnea, PND, swelling in lower extremities, anasarca, dizziness, palpitations  GI  No heartburn, indigestion, abdominal pain, nausea, vomiting, diarrhea, change in bowel habits, loss of appetite  Resp: No shortness of breath with exertion or at rest.  No excess mucus, no productive cough,   non-productive cough,  No coughing up of blood.  No change in color of mucus.  No wheezing.  No chest wall deformity  Skin: no rash or lesions.  GU: no dysuria, change in color of urine, no urgency or frequency.  No flank pain.  MS:   joint pain or swelling.  No decreased range of motion.  No back pain.  Psych:  No change in mood or affect. No depression or anxiety.  No memory loss.      Objective:   Physical Exam Vitals:   03/18/20 1038  BP: 126/85  Pulse: 75  Temp: 98.1 F (36.7 C)  TempSrc: Oral  SpO2: 97%  Weight: 193 lb 3.2 oz (87.6 kg)  Height: 5\' 8"  (1.727 m)    Gen: Pleasant, well-nourished, in no distress,  normal affect  ENT: No lesions,  mouth clear,  oropharynx clear, no postnasal drip  Neck: No JVD, no TMG, no carotid bruits  Lungs: No use of accessory muscles, no dullness to percussion, clear without rales or rhonchi  Cardiovascular: RRR, heart sounds normal, no murmur or gallops, no peripheral edema  Abdomen: soft and NT, no HSM,  BS normal  Musculoskeletal: No deformities, no cyanosis or clubbing  Neuro: alert, non focal  Skin: Warm, no lesions or rashes  No results found.       Assessment & Plan:  I personally reviewed all images and lab data in the [facility] system as well as any outside material available during this office visit and agree with the  radiology impressions.   COVID-19 virus infection COVID-19 viral infection has completely resolved and the patient is a candidate for COVID-19 vaccine and I gave this patient  information as to how to achieve this  Postoperative hypothyroidism Postop hypothyroidism we will get thyroid function panel and continue Synthroid 125 mcg daily  Tobacco use Ongoing tobacco use    . Current smoking consumption amount: 1 pack every 3 days  . Dicsussion on advise to quit smoking and smoking impacts: Impact on cardiovascular and lung health  . Patient's willingness to quit: Appears to be willing  . Methods to quit smoking discussed: Behavioral modification and nicotine therapy replacement  . Medication management of smoking session drugs discussed: Nicotine lozenges  . Resources provided:  AVS   . Setting quit date not determined  . Follow-up arranged 3 months   Time spent counseling the patient:  10 min    Reactive depression Significant grief reaction due to the fact that her son was murdered in January and she lost both her parents last year during the pandemic  Follow-up appointment with our licensed clinical social worker will be made for behavioral therapy counseling and smoking cessation counseling   Diagnoses and all orders for this visit:  Postoperative hypothyroidism -     Thyroid Panel With TSH  Reactive depression  History of COVID-19  Aneurysm ([place])  Tobacco use  Other orders -     nicotine polacrilex (NICORETTE MINI) 4 MG lozenge; Use one lozenge 1-3  daily to stop smoking

## 2020-03-18 ENCOUNTER — Encounter: Payer: Self-pay | Admitting: Critical Care Medicine

## 2020-03-18 ENCOUNTER — Ambulatory Visit: Payer: Self-pay | Attending: Critical Care Medicine | Admitting: Critical Care Medicine

## 2020-03-18 ENCOUNTER — Other Ambulatory Visit: Payer: Self-pay

## 2020-03-18 VITALS — BP 126/85 | HR 75 | Temp 98.1°F | Ht 68.0 in | Wt 193.2 lb

## 2020-03-18 DIAGNOSIS — I729 Aneurysm of unspecified site: Secondary | ICD-10-CM

## 2020-03-18 DIAGNOSIS — E890 Postprocedural hypothyroidism: Secondary | ICD-10-CM

## 2020-03-18 DIAGNOSIS — F329 Major depressive disorder, single episode, unspecified: Secondary | ICD-10-CM

## 2020-03-18 DIAGNOSIS — Z720 Tobacco use: Secondary | ICD-10-CM

## 2020-03-18 DIAGNOSIS — Z8616 Personal history of COVID-19: Secondary | ICD-10-CM

## 2020-03-18 HISTORY — DX: Major depressive disorder, single episode, unspecified: F32.9

## 2020-03-18 HISTORY — DX: Aneurysm of unspecified site: I72.9

## 2020-03-18 MED ORDER — NICOTINE POLACRILEX 4 MG MT LOZG: LOZENGE | OROMUCOSAL | 4 refills | Status: DC

## 2020-03-18 NOTE — Assessment & Plan Note (Addendum)
Significant grief reaction due to the fact that her son was murdered in January and she lost both her parents last year during the pandemic  Follow-up appointment with our licensed clinical social worker will be made for behavioral therapy counseling and smoking cessation counseling

## 2020-03-18 NOTE — Assessment & Plan Note (Signed)
COVID-19 viral infection has completely resolved and the patient is a candidate for COVID-19 vaccine and I gave this patient information as to how to achieve this

## 2020-03-18 NOTE — Patient Instructions (Signed)
Continue levothyroxine and atorvastatin/Aspirin  Meagan Flores our Education officer, museum will contact you regarding smoking cessation counseling and grief reaction from you loss  Try Nicotine lozenges 4mg  2-3 times daily, dissolve in mouth, a prescription sent to our pharmacy  Obtain lab on your thyroid function  Return to see Dr Joya Gaskins in 4 months  Obtain a Covid vaccine series, see below to obtain an appointment  COVID-19 Vaccine Information can be found at: [email or web address].uk For questions related to vaccine distribution or appointments, please email [email or web address] or call [phone].

## 2020-03-18 NOTE — Assessment & Plan Note (Signed)
Ongoing tobacco use    . Current smoking consumption amount: 1 pack every 3 days  . Dicsussion on advise to quit smoking and smoking impacts: Impact on cardiovascular and lung health  . Patient's willingness to quit: Appears to be willing  . Methods to quit smoking discussed: Behavioral modification and nicotine therapy replacement  . Medication management of smoking session drugs discussed: Nicotine lozenges  . Resources provided:  AVS   . Setting quit date not determined  . Follow-up arranged 3 months   Time spent counseling the patient:  10 min

## 2020-03-18 NOTE — Assessment & Plan Note (Signed)
Postop hypothyroidism we will get thyroid function panel and continue Synthroid 125 mcg daily

## 2020-03-18 NOTE — Progress Notes (Signed)
covid f/u  Wants to talk about quit smoking   Mother and father pass away 3 months from each other and son passed awayin Jan 2021

## 2020-03-19 LAB — THYROID PANEL WITH TSH
Free Thyroxine Index: 2.9 (ref 1.2–4.9)
T3 Uptake Ratio: 29 % (ref 24–39)
T4, Total: 9.9 ug/dL (ref 4.5–12.0)
TSH: 0.443 u[IU]/mL — ABNORMAL LOW (ref 0.450–4.500)

## 2020-03-20 ENCOUNTER — Telehealth: Payer: Self-pay | Admitting: Licensed Clinical Social Worker

## 2020-03-20 ENCOUNTER — Ambulatory Visit: Payer: Self-pay | Admitting: Licensed Clinical Social Worker

## 2020-03-20 NOTE — Telephone Encounter (Signed)
Call placed to patient regarding scheduled [facility] appointment. LCSW left message requesting a return call.  

## 2020-03-28 MED FILL — LEVOTHYROXINE 125 MCG TAB: 125 | 30 days supply | Qty: 30 | Fill #1

## 2020-03-31 ENCOUNTER — Ambulatory Visit: Payer: Commercial Managed Care - PPO | Attending: Critical Care Medicine | Admitting: Licensed Clinical Social Worker

## 2020-03-31 ENCOUNTER — Other Ambulatory Visit: Payer: Self-pay

## 2020-03-31 DIAGNOSIS — F331 Major depressive disorder, recurrent, moderate: Secondary | ICD-10-CM | POA: Diagnosis not present

## 2020-04-07 ENCOUNTER — Other Ambulatory Visit: Payer: Self-pay

## 2020-04-07 ENCOUNTER — Ambulatory Visit: Payer: Commercial Managed Care - PPO | Attending: Critical Care Medicine | Admitting: Licensed Clinical Social Worker

## 2020-04-07 DIAGNOSIS — F331 Major depressive disorder, recurrent, moderate: Secondary | ICD-10-CM | POA: Diagnosis not present

## 2020-04-15 ENCOUNTER — Encounter: Payer: Self-pay | Admitting: Critical Care Medicine

## 2020-04-15 ENCOUNTER — Other Ambulatory Visit: Payer: Self-pay

## 2020-04-15 ENCOUNTER — Ambulatory Visit: Payer: Commercial Managed Care - PPO | Attending: Critical Care Medicine | Admitting: Critical Care Medicine

## 2020-04-15 VITALS — BP 117/80 | HR 59 | Temp 97.9°F | Ht 68.0 in | Wt 193.8 lb

## 2020-04-15 DIAGNOSIS — I10 Essential (primary) hypertension: Secondary | ICD-10-CM

## 2020-04-15 DIAGNOSIS — M19019 Primary osteoarthritis, unspecified shoulder: Secondary | ICD-10-CM | POA: Diagnosis not present

## 2020-04-15 DIAGNOSIS — Z720 Tobacco use: Secondary | ICD-10-CM | POA: Diagnosis not present

## 2020-04-15 DIAGNOSIS — M7541 Impingement syndrome of right shoulder: Secondary | ICD-10-CM | POA: Diagnosis not present

## 2020-04-15 DIAGNOSIS — Z8673 Personal history of transient ischemic attack (TIA), and cerebral infarction without residual deficits: Secondary | ICD-10-CM

## 2020-04-15 DIAGNOSIS — F329 Major depressive disorder, single episode, unspecified: Secondary | ICD-10-CM

## 2020-04-15 MED ORDER — GABAPENTIN 300 MG PO CAPS: 300.0000 mg | ORAL_CAPSULE | Freq: Three times a day (TID) | ORAL | 3 refills | Status: DC

## 2020-04-15 MED ORDER — TRAMADOL HCL 50 MG PO TABS: 50.0000 mg | ORAL_TABLET | Freq: Three times a day (TID) | ORAL | 0 refills | Status: AC | PRN

## 2020-04-15 NOTE — Assessment & Plan Note (Signed)
Ongoing tobacco use smoking cessation counseling given to the patient

## 2020-04-15 NOTE — Progress Notes (Signed)
Subjective:    Patient ID: Meagan Flores, female    DOB: [birth year], 54 y.o.   MRN: [id number]  History of Present Illness: This is a 54 year old female who was in the emergency department on 9 March with symptoms consistent with viral infection.  The patient initially came in with general malaise fever muscle aches and pain headaches and dizziness loss of taste and smell Covid test was positive the patient was not hypoxic chest x-ray did not show infiltrates patient was given supportive care fluid hydration and then discharged later in the day.  This is a post ER follow-up visit and also to establish in the clinic as the patient does not have a primary care provider.  This patient states she is improving over the last 24 hours.  Previously over the last several days she had episodes of diarrhea nausea and vomiting without abdominal pain.  She still lost her sense of taste.  She states that she has had cough is paroxysmal but her shortness of breath is resolved urine output is adequate urine is not concentrated.  She states her muscle aches and pains are resolving she still has some pain in the back and hurts some in the legs she also still has some fatigue.  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  Former smoker now only smoking the occasional amounts.  Previous history of stroke and had been on aspirin 81 mg daily and lipid therapy.  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  The only active medicine she has at this time includes over-the-counter zinc and vitamin C and she also has the Synthroid 125 mcg daily.  4/20 This patient returns today in follow-up after the first visit being a telehealth visit.  The patient did have Covid viral infection and is rapidly improved from this.  She still has some fatigue.  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  She is smoking now pack of cigarettes every 3 days.  Note the patient comes in today with adequate blood pressure and not requiring blood pressure medications.  The patient is on Synthroid 125 mcg daily without symptoms of hypothyroidism at this time.  Also is on aspirin and atorvastatin for stroke prevention  This patient has suffered lost during the pandemic and that her son was murdered in January she lost both her parents several months apart late in 2020 she is suffering grief reaction from these losses  04/15/2020 Patient seen in return follow-up and has had complete resolution of the COVID-19 viral infection.  The patient has not yet received the Covid vaccine and is giving this consideration.  Patient states her biggest issue now is right arm and elbow pain.  Her right dominant hand is numb in the ulnar distribution.  She had a cubital fossa surgery impingement release previously by a hand surgeon several years ago in fact almost 10 years ago.  On this occurred on the right elbow and also she has had impingement syndrome of the acromioclavicular joint with surgery arthroscopy done in 2018 by orthopedics locally.  She states her arm is hurting worse now.  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  Patient continues to smoke about a pack every 3 days.  He denies any shortness of breath cough or other respiratory  complaints.  She is wishing to have referral back to orthopedics.  Note at the last visit her thyroid function was normal and she is on Synthroid replacement.  Blood pressure at this visit is well controlled at 117/80.   Past Medical History:  Diagnosis Date  . COVID-19 virus infection 02/14/2020  . Hyperlipidemia   . Hypothyroidism   . Prediabetes   . Stroke ([place])   . Thyroid disease   . TIA (transient ischemic attack) 04/15/2018     Family History  Problem Relation Age of Onset  . Stomach cancer Sister   . Colon polyps Sister   .  Esophageal cancer Neg Hx   . Rectal cancer Neg Hx   . Colon cancer Neg Hx   . Breast cancer Neg Hx      Social History   Socioeconomic History  . Marital status: Married    Spouse name: Not on file  . Number of children: Not on file  . Years of education: Not on file  . Highest education level: Not on file  Occupational History  . Not on file  Tobacco Use  . Smoking status: Current Every Day Smoker    Packs/day: 0.25    Years: 34.00    Pack years: 8.50  . Smokeless tobacco: Never Used  Substance and Sexual Activity  . Alcohol use: Yes    Comment: occassional  . Drug use: No  . Sexual activity: Yes    Birth control/protection: Surgical  Other Topics Concern  . Not on file  Social History Narrative  . Not on file   Social Determinants of Health   Financial Resource Strain:   . Difficulty of Paying Living Expenses:   Food Insecurity:   . Worried About Charity fundraiser in the Last Year:   . Arboriculturist in the Last Year:   Transportation Needs:   . Film/video editor (Medical):   Marland Kitchen Lack of Transportation (Non-Medical):   Physical Activity:   . Days of Exercise per Week:   . Minutes of Exercise per Session:   Stress:   . Feeling of Stress :   Social Connections:   . Frequency of Communication with Friends and Family:   . Frequency of Social Gatherings with Friends and Family:   . Attends Religious Services:   . Active Member of Clubs or Organizations:   . Attends Archivist Meetings:   Marland Kitchen Marital Status:   Intimate Partner Violence:   . Fear of Current or Ex-Partner:   . Emotionally Abused:   Marland Kitchen Physically Abused:   . Sexually Abused:      Allergies  Allergen Reactions  . Sulfa Antibiotics Hives     Outpatient Medications Prior to Visit  Medication Sig Dispense Refill  . aspirin EC 81 MG tablet Take 1 tablet (81 mg total) by mouth daily. 60 tablet 3  . atorvastatin (LIPITOR) 40 MG tablet Take 1 tablet (40 mg total) by mouth daily at 6  PM. 90 tablet 1  . levothyroxine (SYNTHROID) 125 MCG tablet Take 1 tablet (125 mcg total) by mouth daily before breakfast. 30 tablet 2  . nicotine polacrilex (NICORETTE MINI) 4 MG lozenge Use one lozenge 1-3  daily to stop smoking 100 tablet 4  . benzonatate (TESSALON) 100 MG capsule Take 1 capsule (100 mg total) by mouth 3 (three) times daily as needed for cough. (Patient not taking: Reported on 03/18/2020) 21 capsule 0  . fluticasone (FLONASE) 50 MCG/ACT nasal spray  Place 1 spray into both nostrils daily as needed for allergies or rhinitis. (Patient not taking: Reported on 03/18/2020) 16 g 0  . mupirocin ointment (BACTROBAN) 2 % Apply 1 application topically 2 (two) times daily. (Patient not taking: Reported on 03/18/2020) 22 g 0  . ondansetron (ZOFRAN ODT) 4 MG disintegrating tablet Take 1 tablet (4 mg total) by mouth every 8 (eight) hours as needed for nausea or vomiting. (Patient not taking: Reported on 03/18/2020) 5 tablet 0   No facility-administered medications prior to visit.      Review of Systems Constitutional:    weight loss, night sweats,  Fevers, chills, fatigue, lassitude. HEENT:   headaches,  Difficulty swallowing,  Tooth/dental problems,  Sore throat,                No sneezing, itching, ear ache, nasal congestion, post nasal drip,   CV:  No chest pain,  Orthopnea, PND, swelling in lower extremities, anasarca, dizziness, palpitations  GI  No heartburn, indigestion, abdominal pain, nausea, vomiting, diarrhea, change in bowel habits, loss of appetite  Resp: No shortness of breath with exertion or at rest.  No excess mucus, no productive cough,   non-productive cough,  No coughing up of blood.  No change in color of mucus.  No wheezing.  No chest wall deformity  Skin: no rash or lesions.  GU: no dysuria, change in color of urine, no urgency or frequency.  No flank pain.  MS:   joint pain or swelling.  No decreased range of motion.  back pain.  Psych:  No change in mood or  affect. No depression or anxiety.  No memory loss.      Objective:   Physical Exam Vitals:   04/15/20 0842  BP: 117/80  Pulse: (!) 59  Temp: 97.9 F (36.6 C)  TempSrc: Oral  SpO2: 99%  Weight: 193 lb 12.8 oz (87.9 kg)  Height: 5\' 8"  (1.727 m)    Gen: Pleasant, well-nourished, in no distress,  normal affect  ENT: No lesions,  mouth clear,  oropharynx clear, no postnasal drip  Neck: No JVD, no TMG, no carotid bruits  Lungs: No use of accessory muscles, no dullness to percussion, clear without rales or rhonchi  Cardiovascular: RRR, heart sounds normal, no murmur or gallops, no peripheral edema  Abdomen: soft and NT, no HSM,  BS normal  Musculoskeletal: No deformities, no cyanosis or clubbing  Neuro: alert, non focal  Skin: Warm, no lesions or rashes  MRI from 2018 is reviewed and showed bursitis and arthritis in the glenohumeral joint but no rotator cuff Tear there was tendinitis seen as well     Assessment & Plan:  I personally reviewed all images and lab data in the [facility] system as well as any outside material available during this office visit and agree with the  radiology impressions.   Glenohumeral arthritis Glenohumeral arthritis of the right shoulder with bursitis and tendinitis and previous impingement syndrome of the right shoulder along with impingement syndrome of the ulnar nerve at the elbow medially on the right  I prescribed gabapentin 300 mg 3 times daily and low-dose tramadol for a 7-day course   [place] drug database was reviewed and there are no recent prescriptions for opiates patient is low risk for short-term prescription  Patient will be referred to orthopedics and a repeat MRI of the right shoulder will be obtained  Essential hypertension Hypertension well controlled at this time without medications will observe  Tobacco use  Ongoing tobacco use smoking cessation counseling given to the patient   Diagnoses and all orders for this  visit:  Impingement syndrome of right shoulder -     MR SHOULDER RIGHT W WO CONTRAST; Future -     Ambulatory referral to Orthopedic Surgery  Glenohumeral arthritis -     MR SHOULDER RIGHT W WO CONTRAST; Future -     Ambulatory referral to Orthopedic Surgery  Essential hypertension  Tobacco use  Reactive depression  History of TIA (transient ischemic attack)  Other orders -     traMADol (ULTRAM) 50 MG tablet; Take 1 tablet (50 mg total) by mouth every 8 (eight) hours as needed for up to 5 days. -     gabapentin (NEURONTIN) 300 MG capsule; Take 1 capsule (300 mg total) by mouth 3 (three) times daily.  I spent some time recommending the Covid vaccine to this patient

## 2020-04-15 NOTE — Addendum Note (Signed)
Addended by: Elsie Stain on: 04/15/2020 05:31 PM   Modules accepted: Orders

## 2020-04-15 NOTE — Assessment & Plan Note (Signed)
Glenohumeral arthritis of the right shoulder with bursitis and tendinitis and previous impingement syndrome of the right shoulder along with impingement syndrome of the ulnar nerve at the elbow medially on the right  I prescribed gabapentin 300 mg 3 times daily and low-dose tramadol for a 7-day course   [place] drug database was reviewed and there are no recent prescriptions for opiates patient is low risk for short-term prescription  Patient will be referred to orthopedics and a repeat MRI of the right shoulder will be obtained

## 2020-04-15 NOTE — Patient Instructions (Addendum)
Start tramadol one every 8 hours as needed for pain that is severe  Start gabapentin one three times a day for nerve pain in right arm  An MRI will be obtained of the shoulder  A referral back to Dr Erlinda Hong of [facility] will be made  Return Dr Joya Gaskins 6 months  Please focus on stop smoking using nicotine lozenges  Please obtain a covid vaccine  COVID-19 Vaccine Information can be found at: ShippingScam.co.uk For questions related to vaccine distribution or appointments, please email [email or web address] [email or web address] or call [phone].

## 2020-04-15 NOTE — Assessment & Plan Note (Signed)
Hypertension well controlled at this time without medications will observe

## 2020-04-16 ENCOUNTER — Ambulatory Visit: Payer: Commercial Managed Care - PPO | Attending: Critical Care Medicine | Admitting: Licensed Clinical Social Worker

## 2020-04-16 DIAGNOSIS — F331 Major depressive disorder, recurrent, moderate: Secondary | ICD-10-CM

## 2020-04-19 ENCOUNTER — Ambulatory Visit: Payer: Commercial Managed Care - PPO | Attending: Internal Medicine

## 2020-04-27 NOTE — BH Specialist Note (Signed)
Integrated Behavioral Health Initial Visit  MRN: [id number] Name: Meagan Flores  Number of [place] Clinician visits:: 1/6 Session Start time: 3:40 PM  Session End time: 4:10 PM Total time: 30  Type of Service: [place] Interpretor:No. Interpretor Name and Language: NA   SUBJECTIVE: Meagan Flores is a 54 y.o. female accompanied by self Patient was referred by Dr. Joya Gaskins for depression and anxiety. Patient reports the following symptoms/concerns: Pt reports increase in anxiety and depression symptoms triggered by grief Duration of problem: Ongoing; Severity of problem: severe  OBJECTIVE: Mood: Dysphoric and Affect: Depressed Risk of harm to self or others: No plan to harm self or others  LIFE CONTEXT: Family and Social: Pt receives support from family School/Work: Pt is employed full-time Self-Care: Pt is participating in brief therapy Life Changes: Pt has difficulty managing mental and physical health conditions  GOALS ADDRESSED: Patient will: 1. Reduce symptoms of: anxiety and depression 2. Increase knowledge and/or ability of: coping skills and healthy habits  3. Demonstrate ability to: Increase healthy adjustment to current life circumstances, Increase adequate support systems for patient/family and Begin healthy grieving over loss  INTERVENTIONS: Interventions utilized: Solution-Focused Strategies, Supportive Counseling and Psychoeducation and/or Health Education  Standardized Assessments completed: GAD-7 and PHQ 2&9  ASSESSMENT: Patient currently experiencing increase in anxiety and depression symptoms triggered by grief.   Patient may benefit from continued therapy. Therapeutic strategies discussed to assist with management of symptoms.  PLAN: 1. Follow up with behavioral health clinician on : 04/07/20 2. Behavioral recommendations: Utilize strategies discussed 3. Referral(s): [place]  (In Clinic) 4. "From scale of 1-10, how likely are you to follow plan?":   Rebekah Chesterfield, LCSW 04/27/2020 12:05 PM

## 2020-04-30 ENCOUNTER — Ambulatory Visit: Payer: Commercial Managed Care - PPO | Admitting: Licensed Clinical Social Worker

## 2020-05-01 ENCOUNTER — Ambulatory Visit (HOSPITAL_COMMUNITY)
Admission: RE | Admit: 2020-05-01 | Discharge: 2020-05-01 | Disposition: A | Discharge status: Home / Self Care | Payer: Commercial Managed Care - PPO | Source: Ambulatory Visit | Attending: Critical Care Medicine | Admitting: Critical Care Medicine

## 2020-05-01 ENCOUNTER — Other Ambulatory Visit: Payer: Self-pay

## 2020-05-01 DIAGNOSIS — M19019 Primary osteoarthritis, unspecified shoulder: Secondary | ICD-10-CM | POA: Diagnosis present

## 2020-05-01 DIAGNOSIS — M7541 Impingement syndrome of right shoulder: Secondary | ICD-10-CM | POA: Insufficient documentation

## 2020-05-01 MED ORDER — GADOBUTROL 1 MMOL/ML IV SOLN
8.5000 mL | Freq: Once | INTRAVENOUS | Status: AC | PRN
  Administered 2020-05-01: 8.5 mL via INTRAVENOUS

## 2020-05-05 ENCOUNTER — Other Ambulatory Visit: Payer: Self-pay

## 2020-05-05 ENCOUNTER — Ambulatory Visit: Payer: Commercial Managed Care - PPO | Attending: Critical Care Medicine | Admitting: Licensed Clinical Social Worker

## 2020-05-05 DIAGNOSIS — F331 Major depressive disorder, recurrent, moderate: Secondary | ICD-10-CM

## 2020-05-06 ENCOUNTER — Ambulatory Visit (INDEPENDENT_AMBULATORY_CARE_PROVIDER_SITE_OTHER): Payer: Commercial Managed Care - PPO | Admitting: Orthopaedic Surgery

## 2020-05-06 ENCOUNTER — Encounter: Payer: Self-pay | Admitting: Orthopaedic Surgery

## 2020-05-06 DIAGNOSIS — M19019 Primary osteoarthritis, unspecified shoulder: Secondary | ICD-10-CM | POA: Diagnosis not present

## 2020-05-06 DIAGNOSIS — M25511 Pain in right shoulder: Secondary | ICD-10-CM | POA: Diagnosis not present

## 2020-05-06 DIAGNOSIS — G8929 Other chronic pain: Secondary | ICD-10-CM | POA: Diagnosis not present

## 2020-05-06 NOTE — Progress Notes (Signed)
Office Visit Note   Patient: Meagan Flores           Date of Birth: 1966-11-10           MRN: [id number] Visit Date: 05/06/2020              Requested by: Meagan Stain, MD [address],  [place] [address] PCP: Meagan Stain, MD   Assessment & Plan: Visit Diagnoses:  1. Glenohumeral arthritis   2. Chronic right shoulder pain     Plan: Impression is chronic right shoulder pain.  I looked back at her operative note and I reviewed the MRI that she recently had on May 02, 2020.  She has worsening rotator cuff tendinosis with partial articular surface tearing of the supraspinatus.  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  I think overall her shoulder has a lot of things going on that can certainly cause pain.  I would like to try a glenohumeral injection to see if this will give her some relief hopefully.  We will have her follow-up with Dr. Junius Flores for the injection.  She will follow up with me if she does not receive any relief.  Follow-Up Instructions: Return for needs appt with Meagan Flores for right shoulder GH injection.   Orders:  No orders of the defined types were placed in this encounter.  No orders of the defined types were placed in this encounter.     Procedures: No procedures performed   Clinical Data: No additional findings.   Subjective: Chief Complaint  Patient presents with   Right Shoulder - Pain    Meagan Flores is a 54 year old female who is 3 years status post right shoulder arthroscopy with distal clavicle excision and debridement.  She states that she has had increasing pain for months with difficulty sleeping with occasional numbness and tingling.  The pain is worse with raising her arm and she has global pain throughout her shoulder.  She notices aching pain.   Review of Systems  Constitutional: Negative.   HENT: Negative.   Eyes: Negative.   Respiratory: Negative.   Cardiovascular: Negative.   Endocrine:  Negative.   Musculoskeletal: Negative.   Neurological: Negative.   Hematological: Negative.   Psychiatric/Behavioral: Negative.   All other systems reviewed and are negative.    Objective: Vital Signs: There were no vitals taken for this visit.  Physical Exam Vitals and nursing note reviewed.  Constitutional:      Appearance: She is well-developed.  HENT:     Head: Normocephalic and atraumatic.  Pulmonary:     Effort: Pulmonary effort is normal.  Abdominal:     Palpations: Abdomen is soft.  Musculoskeletal:     Cervical back: Neck supple.  Skin:    General: Skin is warm.     Capillary Refill: Capillary refill takes less than 2 seconds.  Neurological:     Mental Status: She is alert and oriented to person, place, and time.  Psychiatric:        Behavior: Behavior normal.        Thought Content: Thought content normal.        Judgment: Judgment normal.     Ortho Exam Right shoulder shows decreased strength secondary to pain and guarding.  She is tender over the biceps.  Range of motion is only slightly limited with discomfort at the extremes. Specialty Comments:  No specialty comments available.  Imaging: No results found.   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   Past Medical History:  Diagnosis Date   COVID-19 virus infection 02/14/2020   Hyperlipidemia    Hypothyroidism    Prediabetes    Stroke Meagan Flores)    Thyroid disease    TIA (transient ischemic attack) 04/15/2018    Family History  Problem Relation Age of Onset   Stomach cancer Sister    Colon polyps Sister     Esophageal cancer Neg Hx    Rectal cancer Neg Hx    Colon cancer Neg Hx    Breast cancer Neg Hx     Past Surgical History:  Procedure Laterality Date   PARTIAL HYSTERECTOMY     right arm sx     SHOULDER ARTHROSCOPY WITH DISTAL CLAVICLE RESECTION Right 01/12/2017   Procedure: RIGHT SHOULDER ARTHROSCOPY WITH  SUBACROMIAL DECOMPRESSION, DISTAL CLAVICLE EXCISION, extensive debridement;  Surgeon: Meagan Koyanagi, MD;  Location: [facility];  Service: Orthopedics;  Laterality: Right;   THYMECTOMY  2004   THYROIDECTOMY     Social History   Occupational History   Not on file  Tobacco Use   Smoking status: Current Every Day Smoker    Packs/day: 0.25    Years: 34.00    Pack years: 8.50   Smokeless tobacco: Never Used  Substance and Sexual Activity   Alcohol use: Yes    Comment: occassional   Drug use: No   Sexual activity: Yes    Birth control/protection: Surgical

## 2020-05-09 ENCOUNTER — Other Ambulatory Visit: Payer: Self-pay

## 2020-05-09 ENCOUNTER — Encounter: Payer: Self-pay | Admitting: Family Medicine

## 2020-05-09 ENCOUNTER — Ambulatory Visit (INDEPENDENT_AMBULATORY_CARE_PROVIDER_SITE_OTHER): Payer: Commercial Managed Care - PPO | Admitting: Family Medicine

## 2020-05-09 ENCOUNTER — Ambulatory Visit: Payer: Self-pay

## 2020-05-09 DIAGNOSIS — M19019 Primary osteoarthritis, unspecified shoulder: Secondary | ICD-10-CM | POA: Diagnosis not present

## 2020-05-09 DIAGNOSIS — M25511 Pain in right shoulder: Secondary | ICD-10-CM

## 2020-05-09 DIAGNOSIS — G8929 Other chronic pain: Secondary | ICD-10-CM

## 2020-05-09 NOTE — BH Specialist Note (Signed)
Integrated Behavioral Health Follow Up Visit  MRN: [id number] Name: Meagan Flores  Number of [place] Clinician visits: 4/6 Session Start time: 4:10 PM  Session End time: 4:25 PM Total time: 15  Type of Service: [place] Interpretor:No. Interpretor Name and Language: NA  SUBJECTIVE: Meagan Flores is a 54 y.o. female accompanied by self Patient was referred by Dr. Joya Gaskins for depression and anxiety. Patient reports the following symptoms/concerns: Patient reports difficulty managing depression and anxiety symptoms triggered by chronic pain and grief. Duration of problem: Ongoing; Severity of problem: severe  OBJECTIVE: Mood: Dysphoric and Affect: Depressed Risk of harm to self or others: No plan to harm self or others  LIFE CONTEXT: Family and Social: Patient receives support from family School/Work: Patient is employed.  She recently completed an appointment with disability who shared she is unable to apply while being employed Self-Care: Patient is participating in brief therapy Life Changes: Patient is experiencing psychosocial stressors, grief, and chronic pain  GOALS ADDRESSED: Patient will: 1.  Reduce symptoms of: anxiety and depression  2.  Increase knowledge and/or ability of: self-management skills  3.  Demonstrate ability to: Increase healthy adjustment to current life circumstances, Increase adequate support systems for patient/family and Increase motivation to adhere to plan of care  INTERVENTIONS: Interventions utilized:  Motivational Interviewing and Supportive Counseling Standardized Assessments completed: GAD-7 and PHQ 2&9  ASSESSMENT: Patient currently experiencing difficulty managing depression anxiety symptoms triggered by chronic pain, psychosocial stressors, and grief.  Patient denies suicidal ideations or homicidal ideations.   Patient may benefit from continued grief therapy.  Added to thinking strategies  discussed.  Patient has an upcoming appointment to discuss treatment plan of arm pain.  Patient provided verbal consent for LCSW to complete referral to legal aid to provide additional assistance with Social Security claim  PLAN: 1. Follow up with behavioral health clinician on : 05/12/20 2. Behavioral recommendations: Utilize strategies discussed 3. Referral(s): [place] (In Clinic) 4. "From scale of 1-10, how likely are you to follow plan?":   Rebekah Chesterfield, LCSW 05/09/2020 9:58 AM

## 2020-05-09 NOTE — Progress Notes (Signed)
Subjective: Patient is here for ultrasound-guided intra-articular right glenohumeral injection.  Ongoing pain status post surgery.  Objective: Pain and slightly decreased range of motion with overhead reach and behind the back reach.  Procedure: Ultrasound-guided right glenohumeral injection: After sterile prep with Betadine, injected 8 cc 1% lidocaine without epinephrine and 40 mg methylprednisolone using a 22-gauge spinal needle, passing the needle from posterior approach into the glenohumeral joint.  Injectate seen filling the joint capsule.  She had good immediate relief and improved range of motion.

## 2020-05-12 ENCOUNTER — Ambulatory Visit: Payer: Commercial Managed Care - PPO | Admitting: Licensed Clinical Social Worker

## 2020-05-14 ENCOUNTER — Ambulatory Visit: Payer: Commercial Managed Care - PPO | Admitting: Licensed Clinical Social Worker

## 2020-05-14 NOTE — BH Specialist Note (Signed)
Integrated Behavioral Health Follow Up Visit  MRN: [id number] Name: Meagan Flores  Number of [place] Clinician visits: 2/6 Session Start time: 4:00 PM  Session End time: 4:30 PM Total time: 30  Type of Service: [place] Interpretor:No. Interpretor Name and Language: NA  SUBJECTIVE: Meagan Flores is a 54 y.o. female accompanied by self Patient was referred by Dr. Joya Gaskins for depression and anxiety. Patient reports the following symptoms/concerns: Patient reports difficulty managing depression anxiety symptoms triggered by grief and chronic pain Duration of problem: Ongoing; Severity of problem: severe  OBJECTIVE: Mood: Depressed and Affect: Depressed Risk of harm to self or others: No plan to harm self or others  LIFE CONTEXT: Family and Social: Patient receives support from family School/Work: Patient is employed Self-Care: Patient participates in group therapy Life Changes: Patient endorses chronic pain and recent loss of mother  GOALS ADDRESSED: Patient will: 1.  Reduce symptoms of: anxiety and depression  2.  Increase knowledge and/or ability of: self-management skills  3.  Demonstrate ability to: Increase healthy adjustment to current life circumstances, Increase adequate support systems for patient/family and Begin healthy grieving over loss  INTERVENTIONS: Interventions utilized:  Brief CBT, Supportive Counseling and Link to [employer] Standardized Assessments completed: Not Needed  ASSESSMENT: Patient currently experiencing increase in anxiety and depression symptoms triggered by grief and chronic pain.   Patient may benefit from ongoing brief therapy and linkage to [facility] grief services.  PLAN: 1. Follow up with behavioral health clinician on : Schedule follow-up appointment with LCSW 2. Behavioral recommendations: Utilize strategies discussed 3. Referral(s): [place] (In Clinic) and Manufacturing engineer 4. "From scale of 1-10, how likely are you to follow plan?":   Meagan Chesterfield, LCSW 05/14/2020 7:50 AM

## 2020-05-14 NOTE — BH Specialist Note (Signed)
Integrated Behavioral Health Follow Up Visit  MRN: [id number] Name: Meagan Flores  Number of [place] Clinician visits: 3/6 Session Start time: 4:30 PM  Session End time: 4:45 PM Total time: 15  Type of Service: [place] Interpretor:No. Interpretor Name and Language: NA  SUBJECTIVE: Meagan Flores is a 54 y.o. female accompanied by self Patient was referred by Dr. Joya Gaskins for depression and anxiety. Patient reports the following symptoms/concerns: Pt reports difficulty managing depression and anxiety symptoms Duration of problem: Ongoing; Severity of problem: severe  OBJECTIVE: Mood: Depressed and Affect: Depressed Risk of harm to self or others: No plan to harm self or others  LIFE CONTEXT: Family and Social: Patient receives support from family School/Work: Patient is employed full-time Self-Care: Patient is participating in brief therapy Life Changes: Patient has difficulty managing mental health physical health conditions  GOALS ADDRESSED: Patient will: 1.  Reduce symptoms of: anxiety and depression  2.  Increase knowledge and/or ability of: self-management skills  3.  Demonstrate ability to: Increase healthy adjustment to current life circumstances and Increase adequate support systems for patient/family  INTERVENTIONS: Interventions utilized:  Mindfulness or Relaxation Training and Supportive Counseling Standardized Assessments completed: Not Needed  ASSESSMENT: Patient currently experiencing increase in anxiety and depression symptoms triggered by grief.   Patient may benefit from continued therapy. Mindfulness strategies discussed to assist with management of symptoms.  PLAN: 1. Follow up with behavioral health clinician on : 04/30/2020 2. Behavioral recommendations: 3. Strategies discussed 4. Referral(s): [place] (In Clinic) 5. "From scale of 1-10, how likely are you to follow plan?":    Rebekah Chesterfield, LCSW 05/14/2020 7:45 AM

## 2020-06-25 ENCOUNTER — Emergency Department (HOSPITAL_COMMUNITY): Payer: Commercial Managed Care - PPO

## 2020-06-25 ENCOUNTER — Emergency Department (HOSPITAL_COMMUNITY)
Admission: EM | Admit: 2020-06-25 | Discharge: 2020-06-25 | Disposition: A | Discharge status: Home / Self Care | Payer: Commercial Managed Care - PPO | Attending: Emergency Medicine | Admitting: Emergency Medicine

## 2020-06-25 ENCOUNTER — Other Ambulatory Visit: Payer: Self-pay

## 2020-06-25 ENCOUNTER — Encounter (HOSPITAL_COMMUNITY): Payer: Self-pay | Admitting: *Deleted

## 2020-06-25 DIAGNOSIS — Z7982 Long term (current) use of aspirin: Secondary | ICD-10-CM | POA: Insufficient documentation

## 2020-06-25 DIAGNOSIS — I10 Essential (primary) hypertension: Secondary | ICD-10-CM | POA: Insufficient documentation

## 2020-06-25 DIAGNOSIS — F17200 Nicotine dependence, unspecified, uncomplicated: Secondary | ICD-10-CM | POA: Diagnosis not present

## 2020-06-25 DIAGNOSIS — E039 Hypothyroidism, unspecified: Secondary | ICD-10-CM | POA: Diagnosis not present

## 2020-06-25 DIAGNOSIS — Z79899 Other long term (current) drug therapy: Secondary | ICD-10-CM | POA: Insufficient documentation

## 2020-06-25 DIAGNOSIS — R0789 Other chest pain: Secondary | ICD-10-CM

## 2020-06-25 DIAGNOSIS — Z79890 Hormone replacement therapy (postmenopausal): Secondary | ICD-10-CM | POA: Insufficient documentation

## 2020-06-25 DIAGNOSIS — R072 Precordial pain: Secondary | ICD-10-CM | POA: Diagnosis present

## 2020-06-25 LAB — BASIC METABOLIC PANEL
Anion gap: 8 (ref 5–15)
BUN: 8 mg/dL (ref 6–20)
CO2: 29 mmol/L (ref 22–32)
Calcium: 8.7 mg/dL — ABNORMAL LOW (ref 8.9–10.3)
Chloride: 102 mmol/L (ref 98–111)
Creatinine, Ser: 0.62 mg/dL (ref 0.44–1.00)
GFR calc Af Amer: >60 mL/min (ref >60)
GFR calc non Af Amer: >60 mL/min (ref >60)
Glucose, Bld: 96 mg/dL (ref 70–99)
Potassium: 3.8 mmol/L (ref 3.5–5.1)
Sodium: 139 mmol/L (ref 135–145)

## 2020-06-25 LAB — I-STAT BETA HCG BLOOD, ED (MC, WL, AP ONLY): I-stat hCG, quantitative: <5 m[IU]/mL (ref <5)

## 2020-06-25 LAB — CBC
HCT: 40.3 % (ref 36.0–46.0)
Hemoglobin: 12.3 g/dL (ref 12.0–15.0)
MCH: 22.9 pg — ABNORMAL LOW (ref 26.0–34.0)
MCHC: 30.5 g/dL (ref 30.0–36.0)
MCV: 75.2 fL — ABNORMAL LOW (ref 80.0–100.0)
Platelets: 232 10*3/uL (ref 150–400)
RBC: 5.36 MIL/uL — ABNORMAL HIGH (ref 3.87–5.11)
RDW: 15.1 % (ref 11.5–15.5)
WBC: 5.3 10*3/uL (ref 4.0–10.5)
nRBC: 0 % (ref 0.0–0.2)

## 2020-06-25 LAB — TROPONIN I (HIGH SENSITIVITY)
Troponin I (High Sensitivity): 4 ng/L (ref <18)
Troponin I (High Sensitivity): 3 ng/L (ref <18)

## 2020-06-25 MED ORDER — SODIUM CHLORIDE 0.9% FLUSH: 3.0000 mL | Freq: Once | INTRAVENOUS | Status: DC

## 2020-06-25 MED ORDER — METHOCARBAMOL 500 MG PO TABS
500.0000 mg | ORAL_TABLET | Freq: Two times a day (BID) | ORAL | 0 refills | Status: DC
Start: 2020-06-25 — End: 2020-07-29

## 2020-06-25 MED ORDER — DICLOFENAC SODIUM 1 % EX GEL: 4.0000 g | Freq: Four times a day (QID) | CUTANEOUS | 1 refills | Status: DC

## 2020-06-25 NOTE — ED Notes (Signed)
Walked patient to the bathroom patient did well patient is now back in bed on the monitor with call bell in reach  

## 2020-06-25 NOTE — ED Triage Notes (Signed)
Pt is here with mid sternal upper chest pain that radiates to the right chest.  No cough, fever, sob.  Pt reports consistent since yesterday.

## 2020-06-25 NOTE — Discharge Instructions (Addendum)
  Acetaminophen: May take acetaminophen (generic for Tylenol), as needed, for pain. Your daily total maximum amount of acetaminophen from all sources should be limited to 4000mg /day for persons without liver problems, or 2000mg /day for those with liver problems. Diclofenac gel: This is a topical anti-inflammatory medication and can be applied directly to the painful region.  Do not use on the face or genitals.  This medication may be used as an alternative to oral anti-inflammatory medications, such as ibuprofen or naproxen. Exercises: Stretching massage the area throughout the day.  This can typically be accomplished by moving the shoulder through its range of motion. Follow-up: May follow-up your primary care provider on this matter.  May also consider follow-up with your orthopedic specialist.

## 2020-06-25 NOTE — ED Provider Notes (Signed)
[facility] EMERGENCY DEPARTMENT Provider Note   CSN: [id number] Arrival date & time: 06/25/20  0730     History Chief Complaint  Patient presents with  . Chest Pain    Meagan Flores is a 54 y.o. female.  HPI      Meagan Flores is a 54 y.o. female, with a history of hyperlipidemia, hypothyroidism, stroke, presenting to the ED with chest pain for the past 2 days beginning during her shift at work.   Right chest, sharp, intermittent, momentary in duration, arises with movement.  History of repetitive motion and lifting with right arm and shoulder.  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  She states, "some of those conditions are quite heavy." Has tried heat, BioFreeze, and muscle relaxer she had from a previous right shoulder surgery.  Denies fever/chills, abdominal pain, shortness of breath, dizziness/lightheadedness, syncope, back pain, numbness, weakness, exertional symptoms, or any other complaints.  Past Medical History:  Diagnosis Date  . COVID-19 virus infection 02/14/2020  . Hyperlipidemia   . Hypothyroidism   . Prediabetes   . Stroke ([place])   . Thyroid disease   . TIA (transient ischemic attack) 04/15/2018    Patient Active Problem List   Diagnosis Date Noted  . 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 Aneurysm ([place]) 03/18/2020  . History of TIA (transient ischemic attack) 02/14/2020  . History of stroke   . Mixed hyperlipidemia   . Essential hypertension   . Impingement syndrome of right shoulder 12/24/2016  . Tobacco use 09/28/2016  . Glenohumeral arthritis 12/27/2014  . 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 PARTIAL HYSTERECTOMY    . right arm sx    . SHOULDER ARTHROSCOPY WITH DISTAL CLAVICLE RESECTION Right 01/12/2017    Procedure: RIGHT SHOULDER ARTHROSCOPY WITH  SUBACROMIAL DECOMPRESSION, DISTAL CLAVICLE EXCISION, extensive debridement;  Surgeon: Leandrew Koyanagi, MD;  Location: [facility];  Service: Orthopedics;  Laterality: Right;  . THYMECTOMY  2004  . THYROIDECTOMY       OB History    Gravida  3   Para  3   Term  3   Preterm  0   AB  0   Living  3     SAB  0   TAB  0   Ectopic  0   Multiple  0   Live Births              Family History  Problem Relation Age of Onset  . Stomach cancer Sister   . Colon polyps Sister   . Esophageal cancer Neg Hx   . Rectal cancer Neg Hx   . Colon cancer Neg Hx   . Breast cancer Neg Hx     Social History   Tobacco Use  . Smoking status: Current Every Day Smoker    Packs/day: 0.25    Years: 34.00    Pack years: 8.50  . Smokeless tobacco: Never Used  Vaping Use  . Vaping Use: Never used  Substance Use Topics  . Alcohol use: Yes    Comment: occassional  . Drug use: No    Home Medications Prior to Admission medications   Medication Sig Start Date End Date Taking? Authorizing Provider  aspirin EC 81 MG tablet Take  1 tablet (81 mg total) by mouth daily. 02/14/20  Yes Elsie Stain, MD  atorvastatin (LIPITOR) 40 MG tablet Take 1 tablet (40 mg total) by mouth daily at 6 PM. 02/14/20  Yes Elsie Stain, MD  levothyroxine (SYNTHROID) 125 MCG tablet Take 1 tablet (125 mcg total) by mouth daily before breakfast. 02/14/20  Yes Elsie Stain, MD  diclofenac Sodium (VOLTAREN) 1 % GEL Apply 4 g topically 4 (four) times daily. 06/25/20   Alyia Lacerte C, PA-C  gabapentin (NEURONTIN) 300 MG capsule Take 1 capsule (300 mg total) by mouth 3 (three) times daily. Patient not taking: Reported on 06/25/2020 04/15/20   Elsie Stain, MD  methocarbamol (ROBAXIN) 500 MG tablet Take 1 tablet (500 mg total) by mouth 2 (two) times daily. 06/25/20   Janeil Schexnayder C, PA-C  nicotine polacrilex (NICORETTE MINI) 4 MG lozenge Use one lozenge 1-3   daily to stop smoking Patient not taking: Reported on 06/25/2020 03/18/20   Elsie Stain, MD    Allergies    Sulfa antibiotics  Review of Systems   Review of Systems  Constitutional: Negative for chills, diaphoresis and fever.  Respiratory: Negative for cough and shortness of breath.   Cardiovascular: Positive for chest pain. Negative for leg swelling.  Gastrointestinal: Negative for abdominal pain, diarrhea, nausea and vomiting.  Musculoskeletal: Negative for back pain.  Neurological: Negative for dizziness, syncope, weakness and numbness.  All other systems reviewed and are negative.   Physical Exam Updated Vital Signs BP 121/84 (BP Location: Left Arm)   Pulse 63   Temp 98.7 F (37.1 C) (Oral)   Resp 18   SpO2 100%   Physical Exam Vitals and nursing note reviewed. Exam conducted with a chaperone present.  Constitutional:      General: She is not in acute distress.    Appearance: She is well-developed. She is not diaphoretic.  HENT:     Head: Normocephalic and atraumatic.     Mouth/Throat:     Mouth: Mucous membranes are moist.     Pharynx: Oropharynx is clear.  Eyes:     Conjunctiva/sclera: Conjunctivae normal.  Cardiovascular:     Rate and Rhythm: Normal rate and regular rhythm.     Pulses: Normal pulses.          Radial pulses are 2+ on the right side and 2+ on the left side.       Posterior tibial pulses are 2+ on the right side and 2+ on the left side.     Heart sounds: Normal heart sounds.     Comments: Tactile temperature in the extremities appropriate and equal bilaterally. Pulmonary:     Effort: Pulmonary effort is normal. No respiratory distress.     Breath sounds: Normal breath sounds.  Chest:    Abdominal:     Palpations: Abdomen is soft.     Tenderness: There is no guarding.  Musculoskeletal:     Cervical back: Neck supple.     Right lower leg: No edema.     Left lower leg: No edema.  Lymphadenopathy:     Cervical: No cervical adenopathy.    Skin:    General: Skin is warm and dry.  Neurological:     Mental Status: She is alert.  Psychiatric:        Mood and Affect: Mood and affect normal.        Speech: Speech normal.        Behavior: Behavior normal.  ED Results / Procedures / Treatments   Labs (all labs ordered are listed, but only abnormal results are displayed) Labs Reviewed  BASIC METABOLIC PANEL - Abnormal; Notable for the following components:      Result Value   Calcium 8.7 (*)    All other components within normal limits  CBC - Abnormal; Notable for the following components:   RBC 5.36 (*)    MCV 75.2 (*)    MCH 22.9 (*)    All other components within normal limits  I-STAT BETA HCG BLOOD, ED ([facility], [facility], [facility] ONLY)  TROPONIN I (HIGH SENSITIVITY)  TROPONIN I (HIGH SENSITIVITY)    EKG EKG Interpretation  Date/Time:  Wednesday June 25 2020 08:52:28 EDT Ventricular Rate:  56 PR Interval:    QRS Duration: 112 QT Interval:  420 QTC Calculation: 406 R Axis:   -47 Text Interpretation: Sinus rhythm Incomplete RBBB and LAFB Abnormal R-wave progression, late transition Nonspecific T abnormalities, lateral leads Confirmed by Madalyn Rob [phone]) on 06/25/2020 9:11:20 AM   Radiology DG Chest 2 View  Result Date: 06/25/2020 CLINICAL DATA:  Chest pain. Additional provided: Patient reports mid sternal/upper chest pain radiating to right chest. EXAM: CHEST - 2 VIEW COMPARISON:  Prior chest radiographs 02/05/2020 and earlier FINDINGS: Prior median sternotomy. Heart size within normal limits. No appreciable airspace consolidation or pulmonary edema. No evidence of pleural effusion or pneumothorax. No acute bony abnormality identified. IMPRESSION: Prior median sternotomy. No evidence of active cardiopulmonary disease. Electronically Signed   By: Kellie Simmering DO   On: 06/25/2020 08:19    Procedures Procedures (including critical care time)  Medications Ordered in ED Medications  sodium chloride flush (NS) 0.9 %  injection 3 mL (3 mLs Intravenous Not Given 06/25/20 1047)    ED Course  I have reviewed the triage vital signs and the nursing notes.  Pertinent labs & imaging results that were available during my care of the patient were reviewed by me and considered in my medical decision making (see chart for details).    MDM Rules/Calculators/A&P                          Patient presents with complaint of chest discomfort that only arises with movement or palpation of the right chest.  Due to the patient's job, my suspicions for muscular source of her pain is high.  Low suspicion for ACS.  EKG without evidence of acute ischemia or pathologic/symptomatic arrhythmia. Delta troponins negative. Wells criteria score is 0, indicating low risk for PE.   Dissection was considered, but thought less likely base on: History and description of the pain are not suggestive, patient is not ill-appearing, lack of risk factors, equal bilateral pulses, lack of neurologic deficits, no widened mediastinum on chest x-ray.  I reviewed and interpreted the patient's labs and radiological studies.   Findings and plan of care discussed with Madalyn Rob, MD. Dr. Roslynn Amble personally evaluated and examined this patient.  Vitals:   06/25/20 1015 06/25/20 1030 06/25/20 1145 06/25/20 1159  BP: (!) 140/93 (!) 140/79 (!) 137/90   Pulse: 51 54 56 57  Resp:   14 12  Temp:      TempSrc:      SpO2: 97% 98% 100% 97%     Final Clinical Impression(s) / ED Diagnoses Final diagnoses:  Atypical chest pain    Rx / DC Orders ED Discharge Orders         Ordered  methocarbamol (ROBAXIN) 500 MG tablet  2 times daily     Discontinue  Reprint     06/25/20 1200    diclofenac Sodium (VOLTAREN) 1 % GEL  4 times daily     Discontinue  Reprint     06/25/20 1200           Lorayne Bender, PA-C 06/25/20 1205    Lucrezia Starch, MD 06/26/20 [phone]

## 2020-07-29 ENCOUNTER — Ambulatory Visit: Payer: Commercial Managed Care - PPO | Attending: Critical Care Medicine | Admitting: Critical Care Medicine

## 2020-07-29 ENCOUNTER — Other Ambulatory Visit: Payer: Self-pay

## 2020-07-29 ENCOUNTER — Encounter: Payer: Self-pay | Admitting: Critical Care Medicine

## 2020-07-29 VITALS — BP 137/85 | HR 72 | Ht 68.0 in | Wt 191.6 lb

## 2020-07-29 DIAGNOSIS — E039 Hypothyroidism, unspecified: Secondary | ICD-10-CM | POA: Diagnosis not present

## 2020-07-29 DIAGNOSIS — Z1231 Encounter for screening mammogram for malignant neoplasm of breast: Secondary | ICD-10-CM | POA: Diagnosis not present

## 2020-07-29 DIAGNOSIS — E782 Mixed hyperlipidemia: Secondary | ICD-10-CM

## 2020-07-29 DIAGNOSIS — I10 Essential (primary) hypertension: Secondary | ICD-10-CM

## 2020-07-29 DIAGNOSIS — Z720 Tobacco use: Secondary | ICD-10-CM

## 2020-07-29 DIAGNOSIS — E890 Postprocedural hypothyroidism: Secondary | ICD-10-CM | POA: Diagnosis not present

## 2020-07-29 DIAGNOSIS — Z8673 Personal history of transient ischemic attack (TIA), and cerebral infarction without residual deficits: Secondary | ICD-10-CM | POA: Diagnosis not present

## 2020-07-29 DIAGNOSIS — M19019 Primary osteoarthritis, unspecified shoulder: Secondary | ICD-10-CM

## 2020-07-29 MED ORDER — LEVOTHYROXINE SODIUM 125 MCG PO TABS: 125.0000 ug | ORAL_TABLET | Freq: Every day | ORAL | 2 refills | Status: DC

## 2020-07-29 MED ORDER — SERTRALINE HCL 50 MG PO TABS: 50.0000 mg | ORAL_TABLET | Freq: Every day | ORAL | 3 refills | Status: DC

## 2020-07-29 MED ORDER — NICOTINE POLACRILEX 4 MG MT LOZG: LOZENGE | OROMUCOSAL | 4 refills | Status: DC

## 2020-07-29 MED ORDER — ATORVASTATIN CALCIUM 40 MG PO TABS: 40.0000 mg | ORAL_TABLET | Freq: Every day | ORAL | 1 refills | Status: DC

## 2020-07-29 MED ORDER — DICLOFENAC SODIUM 1 % EX GEL: 4.0000 g | Freq: Four times a day (QID) | CUTANEOUS | 1 refills | Status: DC

## 2020-07-29 NOTE — Assessment & Plan Note (Signed)
Essential hypertension stable at this time and not on therapy we will continue to monitor

## 2020-07-29 NOTE — Assessment & Plan Note (Signed)
Postoperative hypothyroidism I have refilled the patient's levothyroxine

## 2020-07-29 NOTE — Assessment & Plan Note (Signed)
  .   Current smoking consumption amount: 1 pack every few days  . Dicsussion on advise to quit smoking and smoking impacts: Importance of quitting smoking due to impacts on cardiovascular health and stroke recurrence  . Patient's willingness to quit: Is willing to quit  . Methods to quit smoking discussed: We discussed behavioral modification the use of sertraline to help with her anxiety and depression and nicotine replacement therapy with lozenge 4 mg 3 times daily as needed  . Medication management of smoking session drugs discussed: I have prescribed nicotine replacement therapy  . Resources provided:  AVS   . Setting quit date date not set  . Follow-up arranged follow-up telemedicine visit in 3 weeks   Time spent counseling the patient: 10 minutes

## 2020-07-29 NOTE — Progress Notes (Signed)
Subjective:    Patient ID: Meagan Flores, female    DOB: 21-Nov-1966, 54 y.o.   MRN: [id number]  History of Present Illness: 02/14/20 This is a 54 year old female who was in the emergency department on 9 March with symptoms consistent with viral infection.  The patient initially came in with general malaise fever muscle aches and pain headaches and dizziness loss of taste and smell Covid test was positive the patient was not hypoxic chest x-ray did not show infiltrates patient was given supportive care fluid hydration and then discharged later in the day.  This is a post ER follow-up visit and also to establish in the clinic as the patient does not have a primary care provider.  This patient states she is improving over the last 24 hours.  Previously over the last several days she had episodes of diarrhea nausea and vomiting without abdominal pain.  She still lost her sense of taste.  She states that she has had cough is paroxysmal but her shortness of breath is resolved urine output is adequate urine is not concentrated.  She states her muscle aches and pains are resolving she still has some pain in the back and hurts some in the legs she also still has some fatigue.  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  Former smoker now only smoking the occasional amounts.  Previous history of stroke and had been on aspirin 81 mg daily and lipid therapy.  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  The only active medicine she has at this time includes over-the-counter zinc and vitamin C and she also has the Synthroid 125 mcg daily.  4/20 This patient returns today in follow-up after the first visit being a telehealth visit.  The patient did have Covid viral infection and is rapidly improved from this.  She still has some fatigue.  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  She is smoking now pack of cigarettes every 3 days.  Note the patient comes in today with adequate blood pressure and not requiring blood pressure medications.  The patient is on Synthroid 125 mcg daily without symptoms of hypothyroidism at this time.  Also is on aspirin and atorvastatin for stroke prevention  This patient has suffered lost during the pandemic and that her son was murdered in January she lost both her parents several months apart late in 2020 she is suffering grief reaction from these losses  04/15/2020 Patient seen in return follow-up and has had complete resolution of the COVID-19 viral infection.  The patient has not yet received the Covid vaccine and is giving this consideration.  Patient states her biggest issue now is right arm and elbow pain.  Her right dominant hand is numb in the ulnar distribution.  She had a cubital fossa surgery impingement release previously by a hand surgeon several years ago in fact almost 10 years ago.  On this occurred on the right elbow and also she has had impingement syndrome of the acromioclavicular joint with surgery arthroscopy done in 2018 by orthopedics locally.  She states her arm is hurting worse now.  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  Patient continues to smoke about a pack every 3 days.  He denies any shortness of breath cough or other  respiratory complaints.  She is wishing to have referral back to orthopedics.  Note at the last visit her thyroid function was normal and she is on Synthroid replacement.  Blood pressure at this visit is well controlled at 117/80.  07/29/2020 The patient is seen in return follow-up for hypertension, glenohumeral arthritis of the right shoulder, tobacco use.  Patient states in the interim she is still smoking 1 pack every 4 days of cigarettes.  She has difficulty falling asleep and will sleep late into the morning.  She is noted increased headaches  and palpitations.  She pulled a muscle in her left upper chest recently.  She never received the nicotine replacement therapy rep we recommended.  She is due a mammogram.  She notes increased stress anxiety and dysphoria.  She is not been on antidepressants before and is interested in receiving this.  She notes her right shoulder is still having some pain to take with movement.  She uses Tylenol for this and this seems to help.  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  Note her PHQ 9 score is very elevated at this visit.    Past Medical History:  Diagnosis Date  . COVID-19 virus infection 02/14/2020  . Hyperlipidemia   . Hypothyroidism   . Prediabetes   . Stroke ([place])   . Thyroid disease   . TIA (transient ischemic attack) 04/15/2018     Family History  Problem Relation Age of Onset  . Stomach cancer Sister   . Colon polyps Sister   . Esophageal cancer Neg Hx   . Rectal cancer Neg Hx   . Colon cancer Neg Hx   . Breast cancer Neg Hx      Social History   Socioeconomic History  . Marital status: Married    Spouse name: Not on file  . Number of children: Not on file  . Years of education: Not on file  . Highest education level: Not on file  Occupational History  . Not on file  Tobacco Use  . Smoking status: Current Every Day Smoker    Packs/day: 0.25    Years: 34.00    Pack years: 8.50  . Smokeless tobacco: Never Used  Vaping Use  . Vaping Use: Never used  Substance and Sexual Activity  . Alcohol use: Yes    Comment: occassional  . Drug use: No  . Sexual activity: Yes    Birth control/protection: Surgical  Other Topics Concern  . Not on file  Social History Narrative  . Not on file   Social Determinants of Health   Financial Resource Strain:   . Difficulty of Paying Living Expenses: Not on file  Food Insecurity:   . Worried About Charity fundraiser in the Last Year: Not on file  . Ran Out of Food in the Last Year: Not on  file  Transportation Needs:   . Lack of Transportation (Medical): Not on file  . Lack of Transportation (Non-Medical): Not on file  Physical Activity:   . Days of Exercise per Week: Not on file  . Minutes of Exercise per Session: Not on file  Stress:   . Feeling of Stress : Not on file  Social Connections:   . Frequency of Communication with Friends and Family: Not on file  . Frequency of Social Gatherings with Friends and Family: Not on file  . Attends Religious Services: Not on file  . Active Member of Clubs or Organizations:  Not on file  . Attends Archivist Meetings: Not on file  . Marital Status: Not on file  Intimate Partner Violence:   . Fear of Current or Ex-Partner: Not on file  . Emotionally Abused: Not on file  . Physically Abused: Not on file  . Sexually Abused: Not on file     Allergies  Allergen Reactions  . Sulfa Antibiotics Hives     Outpatient Medications Prior to Visit  Medication Sig Dispense Refill  . aspirin EC 81 MG tablet Take 1 tablet (81 mg total) by mouth daily. 60 tablet 3  . atorvastatin (LIPITOR) 40 MG tablet Take 1 tablet (40 mg total) by mouth daily at 6 PM. 90 tablet 1  . diclofenac Sodium (VOLTAREN) 1 % GEL Apply 4 g topically 4 (four) times daily. 100 g 1  . levothyroxine (SYNTHROID) 125 MCG tablet Take 1 tablet (125 mcg total) by mouth daily before breakfast. 30 tablet 2  . gabapentin (NEURONTIN) 300 MG capsule Take 1 capsule (300 mg total) by mouth 3 (three) times daily. (Patient not taking: Reported on 06/25/2020) 90 capsule 3  . methocarbamol (ROBAXIN) 500 MG tablet Take 1 tablet (500 mg total) by mouth 2 (two) times daily. (Patient not taking: Reported on 07/29/2020) 20 tablet 0  . nicotine polacrilex (NICORETTE MINI) 4 MG lozenge Use one lozenge 1-3  daily to stop smoking (Patient not taking: Reported on 06/25/2020) 100 tablet 4   No facility-administered medications prior to visit.      Review of Systems  Cardiovascular:  Positive for palpitations. Negative for chest pain.  Neurological: Positive for headaches. Negative for tremors, facial asymmetry, speech difficulty, weakness, light-headedness and numbness.  Psychiatric/Behavioral: Positive for dysphoric mood and sleep disturbance. Negative for decreased concentration, self-injury and suicidal ideas. The patient is nervous/anxious and is hyperactive.         Objective:   Physical Exam Vitals:   07/29/20 0840  BP: 137/85  Pulse: 72  SpO2: 97%  Weight: 191 lb 9.6 oz (86.9 kg)  Height: 5\' 8"  (1.727 m)    Gen: Pleasant, well-nourished, in no distress,  normal affect  ENT: No lesions,  mouth clear,  oropharynx clear, no postnasal drip  Neck: No JVD, no TMG, no carotid bruits  Lungs: No use of accessory muscles, no dullness to percussion, clear without rales or rhonchi  Cardiovascular: RRR, heart sounds normal, no murmur or gallops, no peripheral edema  Abdomen: soft and NT, no HSM,  BS normal  Musculoskeletal: No deformities, no cyanosis or clubbing  Neuro: alert, non focal  Skin: Warm, no lesions or rashes     Assessment & Plan:  I personally reviewed all images and lab data in the [facility] system as well as any outside material available during this office visit and agree with the  radiology impressions.   Essential hypertension Essential hypertension stable at this time and not on therapy we will continue to monitor  Postoperative hypothyroidism Postoperative hypothyroidism I have refilled the patient's levothyroxine  Glenohumeral arthritis Patient to continue range of motion exercises and Tylenol for the right shoulder pain  History of stroke Given hypertension and history of stroke I recommend the patient focus on smoking cessation  We will continue atorvastatin for now  Mixed hyperlipidemia Recent lipid panel showed cholesterol approaching goal  Refill atorvastatin continue 40 mg daily  Tobacco use    . Current smoking  consumption amount: 1 pack every few days  . Dicsussion on advise to quit smoking and  smoking impacts: Importance of quitting smoking due to impacts on cardiovascular health and stroke recurrence  . Patient's willingness to quit: Is willing to quit  . Methods to quit smoking discussed: We discussed behavioral modification the use of sertraline to help with her anxiety and depression and nicotine replacement therapy with lozenge 4 mg 3 times daily as needed  . Medication management of smoking session drugs discussed: I have prescribed nicotine replacement therapy  . Resources provided:  AVS   . Setting quit date date not set  . Follow-up arranged follow-up telemedicine visit in 3 weeks   Time spent counseling the patient: 10 minutes    Star was seen today for hypertension and hypothyroidism.  Diagnoses and all orders for this visit:  Encounter for screening mammogram for malignant neoplasm of breast -     MM DIGITAL SCREENING BILATERAL; Future  Hypothyroidism, unspecified type -     levothyroxine (SYNTHROID) 125 MCG tablet; Take 1 tablet (125 mcg total) by mouth daily before breakfast.  History of TIA (transient ischemic attack) -     atorvastatin (LIPITOR) 40 MG tablet; Take 1 tablet (40 mg total) by mouth daily at 6 PM.  Essential hypertension  Postoperative hypothyroidism  Glenohumeral arthritis  History of stroke  Mixed hyperlipidemia  Tobacco use  Other orders -     nicotine polacrilex (NICORETTE MINI) 4 MG lozenge; Use one lozenge 1-3  daily to stop smoking -     diclofenac Sodium (VOLTAREN) 1 % GEL; Apply 4 g topically 4 (four) times daily. -     sertraline (ZOLOFT) 50 MG tablet; Take 1 tablet (50 mg total) by mouth daily.  I spent some time recommending the Covid vaccine to this patient  A screening mammogram was ordered

## 2020-07-29 NOTE — Assessment & Plan Note (Signed)
Given hypertension and history of stroke I recommend the patient focus on smoking cessation  We will continue atorvastatin for now

## 2020-07-29 NOTE — Assessment & Plan Note (Signed)
Patient to continue range of motion exercises and Tylenol for the right shoulder pain

## 2020-07-29 NOTE — Patient Instructions (Addendum)
No change in medications Start sertraline one tablet daily    Thyroid medication sent to your pharmacy  Mammogram to be scheduled  Return to follow up on depression in 1 month: video visit   Smoking Tobacco Information, Adult Smoking tobacco can be harmful to your health. Tobacco contains a poisonous (toxic), colorless chemical called nicotine. Nicotine is addictive. It changes the brain and can make it hard to stop smoking. Tobacco also has other toxic chemicals that can hurt your body and raise your risk of many cancers. How can smoking tobacco affect me? Smoking tobacco puts you at risk for:  Cancer. Smoking is most commonly associated with lung cancer, but can also lead to cancer in other parts of the body.  Chronic obstructive pulmonary disease (COPD). This is a long-term lung condition that makes it hard to breathe. It also gets worse over time.  High blood pressure (hypertension), heart disease, stroke, or heart attack.  Lung infections, such as pneumonia.  Cataracts. This is when the lenses in the eyes become clouded.  Digestive problems. This may include peptic ulcers, heartburn, and gastroesophageal reflux disease (GERD).  Oral health problems, such as gum disease and tooth loss.  Loss of taste and smell. Smoking can affect your appearance by causing:  Wrinkles.  Yellow or stained teeth, fingers, and fingernails. Smoking tobacco can also affect your social life, because:  It may be challenging to find places to smoke when away from home. Many workplaces, [employer], hotels, and public places are tobacco-free.  Smoking is expensive. This is due to the cost of tobacco and the long-term costs of treating health problems from smoking.  Secondhand smoke may affect those around you. Secondhand smoke can cause lung cancer, breathing problems, and heart disease. Children of smokers have a higher risk for: ? Sudden infant death syndrome (SIDS). ? Ear infections. ? Lung  infections. If you currently smoke tobacco, quitting now can help you:  Lead a longer and healthier life.  Look, smell, breathe, and feel better over time.  Save money.  Protect others from the harms of secondhand smoke. What actions can I take to prevent health problems? Quit smoking   Do not start smoking. Quit if you already do.  Make a plan to quit smoking and commit to it. Look for programs to help you and ask your health care provider for recommendations and ideas.  Set a date and write down all the reasons you want to quit.  Let your friends and family know you are quitting so they can help and support you. Consider finding friends who also want to quit. It can be easier to quit with someone else, so that you can support each other.  Talk with your health care provider about using nicotine replacement medicines to help you quit, such as gum, lozenges, patches, sprays, or pills.  Do not replace cigarette smoking with electronic cigarettes, which are commonly called e-cigarettes. The safety of e-cigarettes is not known, and some may contain harmful chemicals.  If you try to quit but return to smoking, stay positive. It is common to slip up when you first quit, so take it one day at a time.  Be prepared for cravings. When you feel the urge to smoke, chew gum or suck on hard candy. Lifestyle  Stay busy and take care of your body.  Drink enough fluid to keep your urine pale yellow.  Get plenty of exercise and eat a healthy diet. This can help prevent weight gain after  quitting.  Monitor your eating habits. Quitting smoking can cause you to have a larger appetite than when you smoke.  Find ways to relax. Go out with friends or family to a movie or a restaurant where people do not smoke.  Ask your health care provider about having regular tests (screenings) to check for cancer. This may include blood tests, imaging tests, and other tests.  Find ways to manage your stress,  such as meditation, yoga, or exercise. Where to find support To get support to quit smoking, consider:  Asking your health care provider for more information and resources.  Taking classes to learn more about quitting smoking.  Looking for local organizations that offer resources about quitting smoking.  Joining a support group for people who want to quit smoking in your local community.  Calling the smokefree.gov counselor helpline: 1-800-Quit-Now 574-079-3923) Where to find more information You may find more information about quitting smoking from:  HelpGuide.org: www.helpguide.org  https://hall.com/: smokefree.gov  American Lung Association: www.lung.org Contact a health care provider if you:  Have problems breathing.  Notice that your lips, nose, or fingers turn blue.  Have chest pain.  Are coughing up blood.  Feel faint or you pass out.  Have other health changes that cause you to worry. Summary  Smoking tobacco can negatively affect your health, the health of those around you, your finances, and your social life.  Do not start smoking. Quit if you already do. If you need help quitting, ask your health care provider.  Think about joining a support group for people who want to quit smoking in your local community. There are many effective programs that will help you to quit this behavior. This information is not intended to replace advice given to you by your health care provider. Make sure you discuss any questions you have with your health care provider. Document Revised: 08/10/2019 Document Reviewed: 11/30/2016 Elsevier Patient Education  2020 [employer].

## 2020-07-29 NOTE — Assessment & Plan Note (Signed)
Recent lipid panel showed cholesterol approaching goal  Refill atorvastatin continue 40 mg daily

## 2020-09-10 ENCOUNTER — Ambulatory Visit: Payer: Commercial Managed Care - PPO | Attending: Critical Care Medicine | Admitting: Critical Care Medicine

## 2020-09-10 ENCOUNTER — Encounter: Payer: Self-pay | Admitting: Critical Care Medicine

## 2020-09-10 DIAGNOSIS — Z720 Tobacco use: Secondary | ICD-10-CM

## 2020-09-10 DIAGNOSIS — E890 Postprocedural hypothyroidism: Secondary | ICD-10-CM

## 2020-09-10 DIAGNOSIS — M7541 Impingement syndrome of right shoulder: Secondary | ICD-10-CM

## 2020-09-10 DIAGNOSIS — Z716 Tobacco abuse counseling: Secondary | ICD-10-CM | POA: Diagnosis not present

## 2020-09-10 DIAGNOSIS — M19019 Primary osteoarthritis, unspecified shoulder: Secondary | ICD-10-CM | POA: Diagnosis not present

## 2020-09-10 DIAGNOSIS — F331 Major depressive disorder, recurrent, moderate: Secondary | ICD-10-CM | POA: Diagnosis not present

## 2020-09-10 MED ORDER — LEVOTHYROXINE SODIUM 50 MCG PO TABS: 100.0000 ug | ORAL_TABLET | Freq: Every day | ORAL | 1 refills | Status: DC

## 2020-09-10 NOTE — Assessment & Plan Note (Signed)
Ongoing glenohumeral arthritis we will have the patient referred back to orthopedics for further evaluation

## 2020-09-10 NOTE — Assessment & Plan Note (Signed)
  .   Current smoking consumption amount: Down to 7 to 8 cigarettes daily  . Dicsussion on advise to quit smoking and smoking impacts: Importance of quitting smoking due to impacts on cardiovascular health and stroke recurrence  . Patient's willingness to quit: Is willing to quit  . Methods to quit smoking discussed: We discussed behavioral modification the use of sertraline to help with her anxiety and depression and nicotine replacement therapy with lozenge 4 mg 3 times daily as needed  . Medication management of smoking session drugs discussed: I have prescribed nicotine replacement therapy  . Resources provided:  AVS   . Setting quit date date not set  . Follow-up arranged follow-up telemedicine visit in 3 weeks   Time spent counseling the patient: 5 minutes  

## 2020-09-10 NOTE — Progress Notes (Signed)
Subjective:    Patient ID: Meagan Flores, female    DOB: Feb 19, 1966, 54 y.o.   MRN: [id number] Virtual Visit via Telephone Note  I connected with Meagan Flores on 09/10/20 at  9:30 AM EDT by telephone and verified that I am speaking with the correct person using two identifiers.   Consent:  I discussed the limitations, risks, security and privacy concerns of performing an evaluation and management service by telephone and the availability of in person appointments. I also discussed with the patient that there may be a patient responsible charge related to this service. The patient expressed understanding and agreed to proceed.  Location of patient: Patient is at home Location of provider: I am in my office Persons participating in the televisit with the patient.   No one else on the call  History of Present Illness: 02/14/20 This is a 54 year old female who was in the emergency department on 9 March with symptoms consistent with viral infection.  The patient initially came in with general malaise fever muscle aches and pain headaches and dizziness loss of taste and smell Covid test was positive the patient was not hypoxic chest x-ray did not show infiltrates patient was given supportive care fluid hydration and then discharged later in the day.  This is a post ER follow-up visit and also to establish in the clinic as the patient does not have a primary care provider.  This patient states she is improving over the last 24 hours.  Previously over the last several days she had episodes of diarrhea nausea and vomiting without abdominal pain.  She still lost her sense of taste.  She states that she has had cough is paroxysmal but her shortness of breath is resolved urine output is adequate urine is not concentrated.  She states her muscle aches and pains are resolving she still has some pain in the back and hurts some in the legs she also still has some fatigue.  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  Former smoker now only smoking the occasional amounts.  Previous history of stroke and had been on aspirin 81 mg daily and lipid therapy.  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  The only active medicine she has at this time includes over-the-counter zinc and vitamin C and she also has the Synthroid 125 mcg daily.  4/20 This patient returns today in follow-up after the first visit being a telehealth visit.  The patient did have Covid viral infection and is rapidly improved from this.  She still has some fatigue.  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  She is smoking now pack of cigarettes every 3 days.  Note the patient comes in today with adequate blood pressure and not requiring blood pressure medications.  The patient is on Synthroid 125 mcg daily without symptoms of hypothyroidism at this time.  Also is on aspirin and atorvastatin for stroke prevention  This patient has suffered lost during the pandemic and that her son was murdered in January she lost both her parents several months apart late in 2020 she is suffering grief reaction from these losses  04/15/2020 Patient seen in return follow-up and has had complete resolution of the COVID-19 viral infection.  The patient has not yet received the Covid vaccine and is  giving this consideration.  Patient states her biggest issue now is right arm and elbow pain.  Her right dominant hand is numb in the ulnar distribution.  She had a cubital fossa surgery impingement release previously by a hand surgeon several years ago in fact almost 10 years ago.  On this occurred on the right elbow and also she has had impingement syndrome of the acromioclavicular joint with surgery arthroscopy done in 2018 by orthopedics locally.  She states her arm is hurting worse now.  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  Patient continues to smoke about a pack every 3 days.  He denies any shortness of breath cough or other respiratory complaints.  She is wishing to have referral back to orthopedics.  Note at the last visit her thyroid function was normal and she is on Synthroid replacement.  Blood pressure at this visit is well controlled at 117/80.  07/29/2020 The patient is seen in return follow-up for hypertension, glenohumeral arthritis of the right shoulder, tobacco use.  Patient states in the interim she is still smoking 1 pack every 4 days of cigarettes.  She has difficulty falling asleep and will sleep late into the morning.  She is noted increased headaches and palpitations.  She pulled a muscle in her left upper chest recently.  She never received the nicotine replacement therapy rep we recommended.  She is due a mammogram.  She notes increased stress anxiety and dysphoria.  She is not been on antidepressants before and is interested in receiving this.  She notes her right shoulder is still having some pain to take with movement.  She uses Tylenol for this and this seems to help.  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  Note her PHQ 9 score is very elevated at this visit.  09/10/2020 This is a telephone visit for this 54 year old female seen in follow-up after had previously been seen in August of this year.  The patient states she is having difficulty with her thyroid medication.  She states since being on the 125 mcg of the Synthroid she is feeling more jittery.  She has lost weight not intentionally.  She has lost her appetite.  She feels her heart is racing.  She is down to 7 to 8 cigarettes daily.  She still has right shoulder pain which is chronic.  She has a history of acromioclavicular joint impingement syndrome and continues to try to work under these conditions.  Her anxiety appears to be unchanged despite  being on sertraline 50 mg daily.  Past Medical History:  Diagnosis Date  . COVID-19 virus infection 02/14/2020  . Hyperlipidemia   . Hypothyroidism   . Prediabetes   . Stroke ([place])   . Thyroid disease   . TIA (transient ischemic attack) 04/15/2018     Family History  Problem Relation Age of Onset  . Stomach cancer Sister   . Colon polyps Sister   . Esophageal cancer Neg Hx   . Rectal cancer Neg Hx   . Colon cancer Neg Hx   . Breast cancer Neg Hx      Social History   Socioeconomic History  . Marital status: Married    Spouse name: Not on file  . Number of children: Not on file  . Years of education: Not on file  . Highest education level: Not on file  Occupational History  . Not on file  Tobacco Use  .  Smoking status: Current Every Day Smoker    Packs/day: 0.25    Years: 34.00    Pack years: 8.50  . Smokeless tobacco: Never Used  Vaping Use  . Vaping Use: Never used  Substance and Sexual Activity  . Alcohol use: Yes    Comment: occassional  . Drug use: No  . Sexual activity: Yes    Birth control/protection: Surgical  Other Topics Concern  . Not on file  Social History Narrative  . Not on file   Social Determinants of Health   Financial Resource Strain:   . Difficulty of Paying Living Expenses: Not on file  Food Insecurity:   . Worried About Charity fundraiser in the Last Year: Not on file  . Ran Out of Food in the Last Year: Not on file  Transportation Needs:   . Lack of Transportation (Medical): Not on file  . Lack of Transportation (Non-Medical): Not on file  Physical Activity:   . Days of Exercise per Week: Not on file  . Minutes of Exercise per Session: Not on file  Stress:   . Feeling of Stress : Not on file  Social Connections:   . Frequency of Communication with Friends and Family: Not on file  . Frequency of Social Gatherings with Friends and Family: Not on file  . Attends Religious Services: Not on file  . Active Member of Clubs or  Organizations: Not on file  . Attends Archivist Meetings: Not on file  . Marital Status: Not on file  Intimate Partner Violence:   . Fear of Current or Ex-Partner: Not on file  . Emotionally Abused: Not on file  . Physically Abused: Not on file  . Sexually Abused: Not on file     Allergies  Allergen Reactions  . Sulfa Antibiotics Hives     Outpatient Medications Prior to Visit  Medication Sig Dispense Refill  . aspirin EC 81 MG tablet Take 1 tablet (81 mg total) by mouth daily. 60 tablet 3  . atorvastatin (LIPITOR) 40 MG tablet Take 1 tablet (40 mg total) by mouth daily at 6 PM. 90 tablet 1  . diclofenac Sodium (VOLTAREN) 1 % GEL Apply 4 g topically 4 (four) times daily. 100 g 1  . nicotine polacrilex (NICORETTE MINI) 4 MG lozenge Use one lozenge 1-3  daily to stop smoking 100 tablet 4  . sertraline (ZOLOFT) 50 MG tablet Take 1 tablet (50 mg total) by mouth daily. 30 tablet 3  . levothyroxine (SYNTHROID) 125 MCG tablet Take 1 tablet (125 mcg total) by mouth daily before breakfast. 90 tablet 2   No facility-administered medications prior to visit.      Review of Systems  Cardiovascular: Positive for palpitations. Negative for chest pain.  Neurological: Positive for headaches. Negative for tremors, facial asymmetry, speech difficulty, weakness, light-headedness and numbness.  Psychiatric/Behavioral: Positive for dysphoric mood and sleep disturbance. Negative for decreased concentration, self-injury and suicidal ideas. The patient is nervous/anxious and is hyperactive.         Objective:   Physical Exam There were no vitals filed for this visit. No exam this is a phone note    Assessment & Plan:  I personally reviewed all images and lab data in the [facility] system as well as any outside material available during this office visit and agree with the  radiology impressions.   Postoperative hypothyroidism Postop hypothyroidism likely on too high dose of Synthroid will  reduce to 100 mcg daily and bring  the patient into the office for follow-up lab data  Glenohumeral arthritis Ongoing glenohumeral arthritis we will have the patient referred back to orthopedics for further evaluation  Tobacco use    . Current smoking consumption amount: Down to 7 to 8 cigarettes daily  . Dicsussion on advise to quit smoking and smoking impacts: Importance of quitting smoking due to impacts on cardiovascular health and stroke recurrence  . Patient's willingness to quit: Is willing to quit  . Methods to quit smoking discussed: We discussed behavioral modification the use of sertraline to help with her anxiety and depression and nicotine replacement therapy with lozenge 4 mg 3 times daily as needed  . Medication management of smoking session drugs discussed: I have prescribed nicotine replacement therapy  . Resources provided:  AVS   . Setting quit date date not set  . Follow-up arranged follow-up telemedicine visit in 3 weeks   Time spent counseling the patient: 5 minutes    Marzell was seen today for nicotine dependence and depression.  Diagnoses and all orders for this visit:  Moderate episode of recurrent major depressive disorder ([place])  Encounter for smoking cessation counseling  Glenohumeral arthritis -     Ambulatory referral to Orthopedic Surgery  Impingement syndrome of right shoulder -     Ambulatory referral to Orthopedic Surgery  Postoperative hypothyroidism  Tobacco use  Other orders -     levothyroxine (SYNTHROID) 50 MCG tablet; Take 2 tablets (100 mcg total) by mouth daily.      Follow Up Instructions:   Patient knows the dose of levothyroxine will be reduced to 100 mcg daily and she will come in for an office exam in 2 to 3 weeks I discussed the assessment and treatment plan with the patient. The patient was provided an opportunity to ask questions and all were answered. The patient agreed with the plan and demonstrated an  understanding of the instructions.   The patient was advised to call back or seek an in-person evaluation if the symptoms worsen or if the condition fails to improve as anticipated.  I provided 30 minutes of non-face-to-face time during this encounter  including  median intraservice time , review of notes, labs, imaging, medications  and explaining diagnosis and management to the patient .    Asencion Noble, MD

## 2020-09-10 NOTE — Assessment & Plan Note (Signed)
Postop hypothyroidism likely on too high dose of Synthroid will reduce to 100 mcg daily and bring the patient into the office for follow-up lab data

## 2020-09-23 ENCOUNTER — Ambulatory Visit: Payer: Commercial Managed Care - PPO | Admitting: Orthopaedic Surgery

## 2020-09-30 ENCOUNTER — Encounter: Payer: Self-pay | Admitting: Orthopaedic Surgery

## 2020-09-30 ENCOUNTER — Other Ambulatory Visit: Payer: Self-pay

## 2020-09-30 ENCOUNTER — Ambulatory Visit (INDEPENDENT_AMBULATORY_CARE_PROVIDER_SITE_OTHER): Payer: Commercial Managed Care - PPO | Admitting: Orthopaedic Surgery

## 2020-09-30 DIAGNOSIS — M67921 Unspecified disorder of synovium and tendon, right upper arm: Secondary | ICD-10-CM | POA: Insufficient documentation

## 2020-09-30 DIAGNOSIS — M19011 Primary osteoarthritis, right shoulder: Secondary | ICD-10-CM | POA: Insufficient documentation

## 2020-09-30 DIAGNOSIS — S43431A Superior glenoid labrum lesion of right shoulder, initial encounter: Secondary | ICD-10-CM

## 2020-09-30 DIAGNOSIS — M7541 Impingement syndrome of right shoulder: Secondary | ICD-10-CM | POA: Insufficient documentation

## 2020-09-30 HISTORY — DX: Primary osteoarthritis, right shoulder: M19.011

## 2020-09-30 HISTORY — DX: Impingement syndrome of right shoulder: M75.41

## 2020-09-30 HISTORY — DX: Superior glenoid labrum lesion of right shoulder, initial encounter: S43.431A

## 2020-09-30 HISTORY — DX: Unspecified disorder of synovium and tendon, right upper arm: M67.921

## 2020-09-30 NOTE — Progress Notes (Signed)
Office Visit Note   Patient: Meagan Flores           Date of Birth: [birth year]           MRN: [id number] Visit Date: 09/30/2020              Requested by: Elsie Stain, MD [address],  [place] [address] PCP: Elsie Stain, MD   Assessment & Plan: Visit Diagnoses:  1. Tendinopathy of right biceps tendon   2. 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) tear of right shoulder   3. Rotator cuff impingement syndrome of right shoulder   4. Arthrosis of right acromioclavicular joint     Plan: Again the MRI findings were reviewed with the patient and given temporary and partial relief from injection and based on discussion of her treatment options and weighing the risk benefits and alternatives to surgery she has decided to proceed with arthroscopic shoulder surgery with plans for extensive debridement, biceps tenodesis, distal clavicle excision.  She has already had a prior subacromial decompression with acromioplasty.  Risk benefits rehab recovery again reviewed with the patient in detail.  Follow-Up Instructions: Return if symptoms worsen or fail to improve.   Orders:  No orders of the defined types were placed in this encounter.  No orders of the defined types were placed in this encounter.     Procedures: No procedures performed   Clinical Data: No additional findings.   Subjective: Chief Complaint  Patient presents with  . Right Shoulder - Pain    Meagan Flores comes in today for continued and chronic right shoulder pain.  She had partial relief from intra-articular shoulder injection on May 09, 2020.  She is over 3 years status post right shoulder scope and debridement.  She feels like the pain has progressed compared to how she felt after surgery before.  MRI is certainly has also shown progression of rotator cuff tendinopathy.   Review of Systems  Constitutional: Negative.   HENT: Negative.   Eyes: Negative.   Respiratory: Negative.    Cardiovascular: Negative.   Endocrine: Negative.   Musculoskeletal: Negative.   Neurological: Negative.   Hematological: Negative.   Psychiatric/Behavioral: Negative.   All other systems reviewed and are negative.    Objective: Vital Signs: There were no vitals taken for this visit.  Physical Exam Vitals and nursing note reviewed.  Constitutional:      Appearance: She is well-developed.  Pulmonary:     Effort: Pulmonary effort is normal.  Skin:    General: Skin is warm.     Capillary Refill: Capillary refill takes less than 2 seconds.  Neurological:     Mental Status: She is alert and oriented to person, place, and time.  Psychiatric:        Behavior: Behavior normal.        Thought Content: Thought content normal.        Judgment: Judgment normal.     Ortho Exam Right shoulder exam is unchanged. Specialty Comments:  No specialty comments available.  Imaging: No results found.   PMFS History: Patient Active Problem List   Diagnosis Date Noted  . Tendinopathy of right biceps tendon 09/30/2020  . 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) tear of right shoulder 09/30/2020  . Rotator cuff impingement syndrome of right shoulder 09/30/2020  . 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 Aneurysm ([place]) 03/18/2020  . History of TIA (transient ischemic attack) 02/14/2020  . History  of stroke   . Mixed hyperlipidemia   . Essential hypertension   . Impingement syndrome of right shoulder 12/24/2016  . Tobacco use 09/28/2016  . Glenohumeral arthritis 12/27/2014  . 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   Past Medical History:  Diagnosis Date  . COVID-19 virus infection 02/14/2020  . Hyperlipidemia   . Hypothyroidism   . Prediabetes   . Stroke ([place])   . Thyroid disease   . TIA (transient ischemic attack) 04/15/2018    Family History  Problem Relation Age of Onset  . Stomach cancer Sister   . Colon  polyps Sister   . Esophageal cancer Neg Hx   . Rectal cancer Neg Hx   . Colon cancer Neg Hx   . Breast cancer Neg Hx     Past Surgical History:  Procedure Laterality Date  . PARTIAL HYSTERECTOMY    . right arm sx    . SHOULDER ARTHROSCOPY WITH DISTAL CLAVICLE RESECTION Right 01/12/2017   Procedure: RIGHT SHOULDER ARTHROSCOPY WITH  SUBACROMIAL DECOMPRESSION, DISTAL CLAVICLE EXCISION, extensive debridement;  Surgeon: Leandrew Koyanagi, MD;  Location: [facility];  Service: Orthopedics;  Laterality: Right;  . THYMECTOMY  2004  . THYROIDECTOMY     Social History   Occupational History  . Not on file  Tobacco Use  . Smoking status: Current Every Day Smoker    Packs/day: 0.25    Years: 34.00    Pack years: 8.50  . Smokeless tobacco: Never Used  Vaping Use  . Vaping Use: Never used  Substance and Sexual Activity  . Alcohol use: Yes    Comment: occassional  . Drug use: No  . Sexual activity: Yes    Birth control/protection: Surgical

## 2020-10-21 ENCOUNTER — Ambulatory Visit: Payer: Commercial Managed Care - PPO | Attending: Critical Care Medicine | Admitting: Critical Care Medicine

## 2020-10-21 ENCOUNTER — Encounter: Payer: Self-pay | Admitting: Critical Care Medicine

## 2020-10-21 ENCOUNTER — Other Ambulatory Visit: Payer: Self-pay

## 2020-10-21 VITALS — BP 149/95 | HR 65 | Wt 193.8 lb

## 2020-10-21 DIAGNOSIS — F329 Major depressive disorder, single episode, unspecified: Secondary | ICD-10-CM

## 2020-10-21 DIAGNOSIS — Z8673 Personal history of transient ischemic attack (TIA), and cerebral infarction without residual deficits: Secondary | ICD-10-CM

## 2020-10-21 DIAGNOSIS — I10 Essential (primary) hypertension: Secondary | ICD-10-CM | POA: Diagnosis not present

## 2020-10-21 DIAGNOSIS — Z720 Tobacco use: Secondary | ICD-10-CM

## 2020-10-21 DIAGNOSIS — R7303 Prediabetes: Secondary | ICD-10-CM

## 2020-10-21 DIAGNOSIS — M7541 Impingement syndrome of right shoulder: Secondary | ICD-10-CM

## 2020-10-21 DIAGNOSIS — E890 Postprocedural hypothyroidism: Secondary | ICD-10-CM | POA: Diagnosis not present

## 2020-10-21 DIAGNOSIS — S43431A Superior glenoid labrum lesion of right shoulder, initial encounter: Secondary | ICD-10-CM

## 2020-10-21 LAB — POCT GLYCOSYLATED HEMOGLOBIN (HGB A1C): Hemoglobin A1C: 5.8 % — AB (ref 4.0–5.6)

## 2020-10-21 LAB — GLUCOSE, POCT (MANUAL RESULT ENTRY): POC Glucose: 86 mg/dL (ref 70–99)

## 2020-10-21 MED ORDER — LEVOTHYROXINE SODIUM 50 MCG PO TABS: 100.0000 ug | ORAL_TABLET | Freq: Every day | ORAL | 1 refills | Status: DC

## 2020-10-21 MED ORDER — ATORVASTATIN CALCIUM 40 MG PO TABS: 40.0000 mg | ORAL_TABLET | Freq: Every day | ORAL | 1 refills | Status: DC

## 2020-10-21 NOTE — Assessment & Plan Note (Signed)
Check thyroid function this visit

## 2020-10-21 NOTE — Assessment & Plan Note (Signed)
Monitor off antidepressant therapy

## 2020-10-21 NOTE — Patient Instructions (Addendum)
We need to get your mammogram scheduled  We will get you scheduled with one of my colleagues for a Pap smear after the first of the year  Work on tobacco cessation follow outlined below  Reduce your salt intake follow diet below  No change in your medications  Labs today include thyroid function  Return to see Dr. Joya Gaskins 2 months   Managing Your Hypertension Hypertension is commonly called high blood pressure. This is when the force of your blood pressing against the walls of your arteries is too strong. Arteries are blood vessels that carry blood from your heart throughout your body. Hypertension forces the heart to work harder to pump blood, and may cause the arteries to become narrow or stiff. Having untreated or uncontrolled hypertension can cause heart attack, stroke, kidney disease, and other problems. What are blood pressure readings? A blood pressure reading consists of a higher number over a lower number. Ideally, your blood pressure should be below 120/80. The first ("top") number is called the systolic pressure. It is a measure of the pressure in your arteries as your heart beats. The second ("bottom") number is called the diastolic pressure. It is a measure of the pressure in your arteries as the heart relaxes. What does my blood pressure reading mean? Blood pressure is classified into four stages. Based on your blood pressure reading, your health care provider may use the following stages to determine what type of treatment you need, if any. Systolic pressure and diastolic pressure are measured in a unit called mm Hg. Normal  Systolic pressure: below 631.  Diastolic pressure: below 80. Elevated  Systolic pressure: 497-026.  Diastolic pressure: below 80. Hypertension stage 1  Systolic pressure: 378-588.  Diastolic pressure: 50-27. Hypertension stage 2  Systolic pressure: 741 or above.  Diastolic pressure: 90 or above. What health risks are associated with  hypertension? Managing your hypertension is an important responsibility. Uncontrolled hypertension can lead to:  A heart attack.  A stroke.  A weakened blood vessel (aneurysm).  Heart failure.  Kidney damage.  Eye damage.  Metabolic syndrome.  Memory and concentration problems. What changes can I make to manage my hypertension? Hypertension can be managed by making lifestyle changes and possibly by taking medicines. Your health care provider will help you make a plan to bring your blood pressure within a normal range. Eating and drinking   Eat a diet that is high in fiber and potassium, and low in salt (sodium), added sugar, and fat. An example eating plan is called the DASH (Dietary Approaches to Stop Hypertension) diet. To eat this way: ? Eat plenty of fresh fruits and vegetables. Try to fill half of your plate at each meal with fruits and vegetables. ? Eat whole grains, such as whole wheat pasta, brown rice, or whole grain bread. Fill about one quarter of your plate with whole grains. ? Eat low-fat diary products. ? Avoid fatty cuts of meat, processed or cured meats, and poultry with skin. Fill about one quarter of your plate with lean proteins such as fish, chicken without skin, beans, eggs, and tofu. ? Avoid premade and processed foods. These tend to be higher in sodium, added sugar, and fat.  Reduce your daily sodium intake. Most people with hypertension should eat less than 1,500 mg of sodium a day.  Limit alcohol intake to no more than 1 drink a day for nonpregnant women and 2 drinks a day for men. One drink equals 12 oz of beer, 5 oz  of wine, or 1 oz of hard liquor. Lifestyle  Work with your health care provider to maintain a healthy body weight, or to lose weight. Ask what an ideal weight is for you.  Get at least 30 minutes of exercise that causes your heart to beat faster (aerobic exercise) most days of the week. Activities may include walking, swimming, or  biking.  Include exercise to strengthen your muscles (resistance exercise), such as weight lifting, as part of your weekly exercise routine. Try to do these types of exercises for 30 minutes at least 3 days a week.  Do not use any products that contain nicotine or tobacco, such as cigarettes and e-cigarettes. If you need help quitting, ask your health care provider.  Control any long-term (chronic) conditions you have, such as high cholesterol or diabetes. Monitoring  Monitor your blood pressure at home as told by your health care provider. Your personal target blood pressure may vary depending on your medical conditions, your age, and other factors.  Have your blood pressure checked regularly, as often as told by your health care provider. Working with your health care provider  Review all the medicines you take with your health care provider because there may be side effects or interactions.  Talk with your health care provider about your diet, exercise habits, and other lifestyle factors that may be contributing to hypertension.  Visit your health care provider regularly. Your health care provider can help you create and adjust your plan for managing hypertension. Will I need medicine to control my blood pressure? Your health care provider may prescribe medicine if lifestyle changes are not enough to get your blood pressure under control, and if:  Your systolic blood pressure is 130 or higher.  Your diastolic blood pressure is 80 or higher. Take medicines only as told by your health care provider. Follow the directions carefully. Blood pressure medicines must be taken as prescribed. The medicine does not work as well when you skip doses. Skipping doses also puts you at risk for problems. Contact a health care provider if:  You think you are having a reaction to medicines you have taken.  You have repeated (recurrent) headaches.  You feel dizzy.  You have swelling in your  ankles.  You have trouble with your vision. Get help right away if:  You develop a severe headache or confusion.  You have unusual weakness or numbness, or you feel faint.  You have severe pain in your chest or abdomen.  You vomit repeatedly.  You have trouble breathing. Summary  Hypertension is when the force of blood pumping through your arteries is too strong. If this condition is not controlled, it may put you at risk for serious complications.  Your personal target blood pressure may vary depending on your medical conditions, your age, and other factors. For most people, a normal blood pressure is less than 120/80.  Hypertension is managed by lifestyle changes, medicines, or both. Lifestyle changes include weight loss, eating a healthy, low-sodium diet, exercising more, and limiting alcohol. This information is not intended to replace advice given to you by your health care provider. Make sure you discuss any questions you have with your health care provider. Document Revised: 03/09/2019 Document Reviewed: 10/13/2016 Elsevier Patient Education  [place].    Tobacco Use Disorder Tobacco use disorder (TUD) occurs when a person craves, seeks, and uses tobacco, regardless of the consequences. This disorder can cause problems with mental and physical health. It can affect your  ability to have healthy relationships, and it can keep you from meeting your responsibilities at work, home, or school. Tobacco may be:  Smoked as a cigarette or cigar.  Inhaled using e-cigarettes.  Smoked in a pipe or hookah.  Chewed as smokeless tobacco.  Inhaled into the nostrils as snuff. Tobacco products contain a dangerous chemical called nicotine, which is very addictive. Nicotine triggers hormones that make the body feel stimulated and works on areas of the brain that make you feel good. These effects can make it hard for people to quit nicotine. Tobacco contains many other unsafe  chemicals that can damage almost every organ in the body. Smoking tobacco also puts others in danger due to fire risk and possible health problems caused by breathing in secondhand smoke. What are the signs or symptoms? Symptoms of TUD may include:  Being unable to slow down or stop your tobacco use.  Spending an abnormal amount of time getting or using tobacco.  Craving tobacco. Cravings may last for up to 6 months after quitting.  Tobacco use that: ? Interferes with your work, school, or home life. ? Interferes with your personal and social relationships. ? Makes you give up activities that you once enjoyed or found important.  Using tobacco even though you know that it is: ? Dangerous or bad for your health or someone else's health. ? Causing problems in your life.  Needing more and more of the substance to get the same effect (developing tolerance).  Experiencing unpleasant symptoms if you do not use the substance (withdrawal). Withdrawal symptoms may include: ? Depressed, anxious, or irritable mood. ? Difficulty concentrating. ? Increased appetite. ? Restlessness or trouble sleeping.  Using the substance to avoid withdrawal. How is this diagnosed? This condition may be diagnosed based on:  Your current and past tobacco use. Your health care provider may ask questions about how your tobacco use affects your life.  A physical exam. You may be diagnosed with TUD if you have at least two symptoms within a 28-month period. How is this treated? This condition is treated by stopping tobacco use. Many people are unable to quit on their own and need help. Treatment may include:  Nicotine replacement therapy (NRT). NRT provides nicotine without the other harmful chemicals in tobacco. NRT gradually lowers the dosage of nicotine in the body and reduces withdrawal symptoms. NRT is available as: ? Over-the-counter gums, lozenges, and skin patches. ? Prescription mouth inhalers and  nasal sprays.  Medicine that acts on the brain to reduce cravings and withdrawal symptoms.  A type of talk therapy that examines your triggers for tobacco use, how to avoid them, and how to cope with cravings (behavioral therapy).  Hypnosis. This may help with withdrawal symptoms.  Joining a support group for others coping with TUD. The best treatment for TUD is usually a combination of medicine, talk therapy, and support groups. Recovery can be a long process. Many people start using tobacco again after stopping (relapse). If you relapse, it does not mean that treatment will not work. Follow these instructions at home:  Lifestyle  Do not use any products that contain nicotine or tobacco, such as cigarettes and e-cigarettes.  Avoid things that trigger tobacco use as much as you can. Triggers include people and situations that usually cause you to use tobacco.  Avoid drinks that contain caffeine, including coffee. These may worsen some withdrawal symptoms.  Find ways to manage stress. Wanting to smoke may cause stress, and stress can make  you want to smoke. Relaxation techniques such as deep breathing, meditation, and yoga may help.  Attend support groups as needed. These groups are an important part of long-term recovery for many people. General instructions  Take over-the-counter and prescription medicines only as told by your health care provider.  Check with your health care provider before taking any new prescription or over-the-counter medicines.  Decide on a friend, family member, or smoking quit-line (such as 1-800-QUIT-NOW in the [place] that you can call or text when you feel the urge to smoke or when you need help coping with cravings.  Keep all follow-up visits as told by your health care provider and therapist. This is important. Contact a health care provider if:  You are not able to take your medicines as prescribed.  Your symptoms get worse, even with  treatment. Summary  Tobacco use disorder (TUD) occurs when a person craves, seeks, and uses tobacco regardless of the consequences.  This condition may be diagnosed based on your current and past tobacco use and a physical exam.  Many people are unable to quit on their own and need help. Recovery can be a long process.  The most effective treatment for TUD is usually a combination of medicine, talk therapy, and support groups. This information is not intended to replace advice given to you by your health care provider. Make sure you discuss any questions you have with your health care provider. Document Revised: 11/02/2017 Document Reviewed: 11/02/2017 Elsevier Patient Education  2020 [employer].

## 2020-10-21 NOTE — Assessment & Plan Note (Signed)
Surgical intervention per orthopedics planned

## 2020-10-21 NOTE — Assessment & Plan Note (Signed)
Borderline hypertension we will not start medications but advocate smoking cessation further monitor blood pressures and bring these back for repeat assessment

## 2020-10-21 NOTE — Progress Notes (Signed)
Subjective:    Patient ID: Meagan Flores, female    DOB: 21-Nov-1966, 54 y.o.   MRN: [id number]  History of Present Illness: 02/14/20 This is a 54 year old female who was in the emergency department on 9 March with symptoms consistent with viral infection.  The patient initially came in with general malaise fever muscle aches and pain headaches and dizziness loss of taste and smell Covid test was positive the patient was not hypoxic chest x-ray did not show infiltrates patient was given supportive care fluid hydration and then discharged later in the day.  This is a post ER follow-up visit and also to establish in the clinic as the patient does not have a primary care provider.  This patient states she is improving over the last 24 hours.  Previously over the last several days she had episodes of diarrhea nausea and vomiting without abdominal pain.  She still lost her sense of taste.  She states that she has had cough is paroxysmal but her shortness of breath is resolved urine output is adequate urine is not concentrated.  She states her muscle aches and pains are resolving she still has some pain in the back and hurts some in the legs she also still has some fatigue.  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  Former smoker now only smoking the occasional amounts.  Previous history of stroke and had been on aspirin 81 mg daily and lipid therapy.  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  The only active medicine she has at this time includes over-the-counter zinc and vitamin C and she also has the Synthroid 125 mcg daily.  4/20 This patient returns today in follow-up after the first visit being a telehealth visit.  The patient did have Covid viral infection and is rapidly improved from this.  She still has some fatigue.  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  She is smoking now pack of cigarettes every 3 days.  Note the patient comes in today with adequate blood pressure and not requiring blood pressure medications.  The patient is on Synthroid 125 mcg daily without symptoms of hypothyroidism at this time.  Also is on aspirin and atorvastatin for stroke prevention  This patient has suffered lost during the pandemic and that her son was murdered in January she lost both her parents several months apart late in 2020 she is suffering grief reaction from these losses  04/15/2020 Patient seen in return follow-up and has had complete resolution of the COVID-19 viral infection.  The patient has not yet received the Covid vaccine and is giving this consideration.  Patient states her biggest issue now is right arm and elbow pain.  Her right dominant hand is numb in the ulnar distribution.  She had a cubital fossa surgery impingement release previously by a hand surgeon several years ago in fact almost 10 years ago.  On this occurred on the right elbow and also she has had impingement syndrome of the acromioclavicular joint with surgery arthroscopy done in 2018 by orthopedics locally.  She states her arm is hurting worse now.  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  Patient continues to smoke about a pack every 3 days.  He denies any shortness of breath cough or other  respiratory complaints.  She is wishing to have referral back to orthopedics.  Note at the last visit her thyroid function was normal and she is on Synthroid replacement.  Blood pressure at this visit is well controlled at 117/80.  07/29/2020 The patient is seen in return follow-up for hypertension, glenohumeral arthritis of the right shoulder, tobacco use.  Patient states in the interim she is still smoking 1 pack every 4 days of cigarettes.  She has difficulty falling asleep and will sleep late into the morning.  She is noted increased headaches  and palpitations.  She pulled a muscle in her left upper chest recently.  She never received the nicotine replacement therapy rep we recommended.  She is due a mammogram.  She notes increased stress anxiety and dysphoria.  She is not been on antidepressants before and is interested in receiving this.  She notes her right shoulder is still having some pain to take with movement.  She uses Tylenol for this and this seems to help.  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  Note her PHQ 9 score is very elevated at this visit.  09/10/2020 This is a telephone visit for this 54 year old female seen in follow-up after had previously been seen in August of this year.  The patient states she is having difficulty with her thyroid medication.  She states since being on the 125 mcg of the Synthroid she is feeling more jittery.  She has lost weight not intentionally.  She has lost her appetite.  She feels her heart is racing.  She is down to 7 to 8 cigarettes daily.  She still has right shoulder pain which is chronic.  She has a history of acromioclavicular joint impingement syndrome and continues to try to work under these conditions.  Her anxiety appears to be unchanged despite being on sertraline 50 mg daily.  10/21/2020 Patient is seen in return follow-up visit history of postoperative hypothyroidism we reduced her dose to 7 thyroid 100 mcg daily at the last visit she is now here for follow-up labs  Also history of glenohumeral arthritis with nerve impingement syndrome and tendinosis of the biceps tendon had she has been seen by orthopedics and she will need to undergo orthoscopic surgery of the shoulder per Dr. Sherrian Divers in December  Patient currently is smoking 7 to 8 cigarettes daily she is attempting to try to reduce tobacco intake further and is using the nicotine lozenge as needed for this  She is due a mammogram and has no other complaints at this visit   Past Medical  History:  Diagnosis Date  . COVID-19 virus infection 02/14/2020  . Hyperlipidemia   . Hypothyroidism   . Prediabetes   . Stroke ([place])   . Thyroid disease   . TIA (transient ischemic attack) 04/15/2018     Family History  Problem Relation Age of Onset  . Stomach cancer Sister   . Colon polyps Sister   . Esophageal cancer Neg Hx   . Rectal cancer Neg Hx   . Colon cancer Neg Hx   . Breast cancer Neg Hx      Social History   Socioeconomic History  . Marital status: Married    Spouse name: Not on file  . Number of children: Not on file  . Years of education: Not on file  . Highest education level: Not on file  Occupational History  . Not on file  Tobacco Use  . Smoking  status: Current Every Day Smoker    Packs/day: 0.25    Years: 34.00    Pack years: 8.50  . Smokeless tobacco: Never Used  Vaping Use  . Vaping Use: Never used  Substance and Sexual Activity  . Alcohol use: Yes    Comment: occassional  . Drug use: No  . Sexual activity: Yes    Birth control/protection: Surgical  Other Topics Concern  . Not on file  Social History Narrative  . Not on file   Social Determinants of Health   Financial Resource Strain:   . Difficulty of Paying Living Expenses: Not on file  Food Insecurity:   . Worried About Charity fundraiser in the Last Year: Not on file  . Ran Out of Food in the Last Year: Not on file  Transportation Needs:   . Lack of Transportation (Medical): Not on file  . Lack of Transportation (Non-Medical): Not on file  Physical Activity:   . Days of Exercise per Week: Not on file  . Minutes of Exercise per Session: Not on file  Stress:   . Feeling of Stress : Not on file  Social Connections:   . Frequency of Communication with Friends and Family: Not on file  . Frequency of Social Gatherings with Friends and Family: Not on file  . Attends Religious Services: Not on file  . Active Member of Clubs or Organizations: Not on file  . Attends Theatre manager Meetings: Not on file  . Marital Status: Not on file  Intimate Partner Violence:   . Fear of Current or Ex-Partner: Not on file  . Emotionally Abused: Not on file  . Physically Abused: Not on file  . Sexually Abused: Not on file     Allergies  Allergen Reactions  . Sulfa Antibiotics Hives     Outpatient Medications Prior to Visit  Medication Sig Dispense Refill  . aspirin EC 81 MG tablet Take 1 tablet (81 mg total) by mouth daily. 60 tablet 3  . atorvastatin (LIPITOR) 40 MG tablet Take 1 tablet (40 mg total) by mouth daily at 6 PM. 90 tablet 1  . levothyroxine (SYNTHROID) 50 MCG tablet Take 2 tablets (100 mcg total) by mouth daily. 60 tablet 1  . acetaminophen-codeine (TYLENOL #3) 300-30 MG tablet Take 1 tablet by mouth every 6 (six) hours as needed.    . diclofenac Sodium (VOLTAREN) 1 % GEL Apply 4 g topically 4 (four) times daily. (Patient not taking: Reported on 10/21/2020) 100 g 1  . nicotine polacrilex (NICORETTE MINI) 4 MG lozenge Use one lozenge 1-3  daily to stop smoking (Patient not taking: Reported on 10/21/2020) 100 tablet 4  . sertraline (ZOLOFT) 50 MG tablet Take 1 tablet (50 mg total) by mouth daily. (Patient not taking: Reported on 10/21/2020) 30 tablet 3   No facility-administered medications prior to visit.      Review of Systems  Cardiovascular: Positive for palpitations. Negative for chest pain.  Neurological: Positive for headaches. Negative for tremors, facial asymmetry, speech difficulty, weakness, light-headedness and numbness.  Psychiatric/Behavioral: Negative for decreased concentration, dysphoric mood, self-injury, sleep disturbance and suicidal ideas. The patient is not nervous/anxious and is not hyperactive.         Objective:   Physical Exam Vitals:   10/21/20 0940  BP: (!) 149/95  Pulse: 65  SpO2: 98%  Weight: 193 lb 12.8 oz (87.9 kg)   No exam this is a phone note    Assessment & Plan:  I personally reviewed all images and  lab data in the [facility] system as well as any outside material available during this office visit and agree with the  radiology impressions.   Essential hypertension Borderline hypertension we will not start medications but advocate smoking cessation further monitor blood pressures and bring these back for repeat assessment  Postoperative hypothyroidism Check thyroid function this visit  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) tear of right shoulder Surgical intervention per orthopedics planned  Rotator cuff impingement syndrome of right shoulder Review other orthopedic assessment  Reactive depression Monitor off antidepressant therapy  Tobacco use    . Current smoking consumption amount: Down to 7 to 8 cigarettes daily  . Dicsussion on advise to quit smoking and smoking impacts: Importance of quitting smoking due to impacts on cardiovascular health and stroke recurrence  . Patient's willingness to quit: Is willing to quit  . Methods to quit smoking discussed: We discussed behavioral modification the use of sertraline to help with her anxiety and depression and nicotine replacement therapy with lozenge 4 mg 3 times daily as needed  . Medication management of smoking session drugs discussed: I have prescribed nicotine replacement therapy  . Resources provided:  AVS   . Setting quit date date not set  . Follow-up arranged follow-up telemedicine visit in 3 weeks   Time spent counseling the patient: 5 minutes    Kellis was seen today for follow-up.  Diagnoses and all orders for this visit:  Prediabetes -     POCT glucose (manual entry) -     POCT glycosylated hemoglobin (Hb A1C)  Postoperative hypothyroidism -     Thyroid Panel With TSH  History of TIA (transient ischemic attack) -     atorvastatin (LIPITOR) 40 MG tablet; Take 1 tablet (40 mg total) by mouth daily at 6 PM.  Essential hypertension  Degenerative superior labral anterior-to-posterior  (SLAP) tear of right shoulder  Rotator cuff impingement syndrome of right shoulder  Reactive depression  Tobacco use  Other orders -     levothyroxine (SYNTHROID) 50 MCG tablet; Take 2 tablets (100 mcg total) by mouth daily.

## 2020-10-21 NOTE — Assessment & Plan Note (Signed)
Review other orthopedic assessment

## 2020-10-21 NOTE — Assessment & Plan Note (Signed)
  .   Current smoking consumption amount: Down to 7 to 8 cigarettes daily  . Dicsussion on advise to quit smoking and smoking impacts: Importance of quitting smoking due to impacts on cardiovascular health and stroke recurrence  . Patient's willingness to quit: Is willing to quit  . Methods to quit smoking discussed: We discussed behavioral modification the use of sertraline to help with her anxiety and depression and nicotine replacement therapy with lozenge 4 mg 3 times daily as needed  . Medication management of smoking session drugs discussed: I have prescribed nicotine replacement therapy  . Resources provided:  AVS   . Setting quit date date not set  . Follow-up arranged follow-up telemedicine visit in 3 weeks   Time spent counseling the patient: 5 minutes

## 2020-10-22 ENCOUNTER — Ambulatory Visit: Payer: Self-pay | Admitting: *Deleted

## 2020-10-22 LAB — THYROID PANEL WITH TSH
Free Thyroxine Index: 2.9 (ref 1.2–4.9)
T3 Uptake Ratio: 27 % (ref 24–39)
T4, Total: 10.9 ug/dL (ref 4.5–12.0)
TSH: 3.3 u[IU]/mL (ref 0.450–4.500)

## 2020-10-22 NOTE — Telephone Encounter (Signed)
Pt given lab results per notes of Dr. Joya Gaskins on 10/22/20. Pt verbalized understanding that there is no change in  thyroid medication.

## 2020-10-31 ENCOUNTER — Other Ambulatory Visit: Payer: Self-pay

## 2020-11-01 ENCOUNTER — Other Ambulatory Visit: Payer: Self-pay

## 2020-11-01 ENCOUNTER — Emergency Department (HOSPITAL_COMMUNITY): Payer: Commercial Managed Care - PPO

## 2020-11-01 ENCOUNTER — Encounter (HOSPITAL_COMMUNITY): Payer: Self-pay | Admitting: Emergency Medicine

## 2020-11-01 ENCOUNTER — Emergency Department (HOSPITAL_COMMUNITY)
Admission: EM | Admit: 2020-11-01 | Discharge: 2020-11-02 | Disposition: A | Discharge status: Home / Self Care | Payer: Commercial Managed Care - PPO | Attending: Emergency Medicine | Admitting: Emergency Medicine

## 2020-11-01 DIAGNOSIS — I10 Essential (primary) hypertension: Secondary | ICD-10-CM | POA: Insufficient documentation

## 2020-11-01 DIAGNOSIS — F17200 Nicotine dependence, unspecified, uncomplicated: Secondary | ICD-10-CM | POA: Diagnosis not present

## 2020-11-01 DIAGNOSIS — Z79899 Other long term (current) drug therapy: Secondary | ICD-10-CM | POA: Diagnosis not present

## 2020-11-01 DIAGNOSIS — E039 Hypothyroidism, unspecified: Secondary | ICD-10-CM | POA: Insufficient documentation

## 2020-11-01 DIAGNOSIS — S93402A Sprain of unspecified ligament of left ankle, initial encounter: Secondary | ICD-10-CM | POA: Insufficient documentation

## 2020-11-01 DIAGNOSIS — Z5321 Procedure and treatment not carried out due to patient leaving prior to being seen by health care provider: Secondary | ICD-10-CM | POA: Insufficient documentation

## 2020-11-01 DIAGNOSIS — Z7982 Long term (current) use of aspirin: Secondary | ICD-10-CM | POA: Diagnosis not present

## 2020-11-01 DIAGNOSIS — S99912A Unspecified injury of left ankle, initial encounter: Secondary | ICD-10-CM | POA: Insufficient documentation

## 2020-11-01 DIAGNOSIS — W1849XA Other slipping, tripping and stumbling without falling, initial encounter: Secondary | ICD-10-CM | POA: Diagnosis not present

## 2020-11-01 NOTE — ED Triage Notes (Signed)
Patient tripped and injured her left ankle this evening with pain and mild swelling .

## 2020-11-02 ENCOUNTER — Emergency Department (HOSPITAL_BASED_OUTPATIENT_CLINIC_OR_DEPARTMENT_OTHER)
Admission: EM | Admit: 2020-11-02 | Discharge: 2020-11-02 | Disposition: A | Discharge status: Home / Self Care | Payer: Commercial Managed Care - PPO | Source: Home / Self Care | Attending: Emergency Medicine | Admitting: Emergency Medicine

## 2020-11-02 ENCOUNTER — Encounter (HOSPITAL_BASED_OUTPATIENT_CLINIC_OR_DEPARTMENT_OTHER): Payer: Self-pay | Admitting: Emergency Medicine

## 2020-11-02 DIAGNOSIS — Z79899 Other long term (current) drug therapy: Secondary | ICD-10-CM | POA: Insufficient documentation

## 2020-11-02 DIAGNOSIS — E039 Hypothyroidism, unspecified: Secondary | ICD-10-CM | POA: Insufficient documentation

## 2020-11-02 DIAGNOSIS — F17200 Nicotine dependence, unspecified, uncomplicated: Secondary | ICD-10-CM | POA: Insufficient documentation

## 2020-11-02 DIAGNOSIS — S93402A Sprain of unspecified ligament of left ankle, initial encounter: Secondary | ICD-10-CM | POA: Insufficient documentation

## 2020-11-02 DIAGNOSIS — Z7982 Long term (current) use of aspirin: Secondary | ICD-10-CM | POA: Insufficient documentation

## 2020-11-02 DIAGNOSIS — I10 Essential (primary) hypertension: Secondary | ICD-10-CM | POA: Insufficient documentation

## 2020-11-02 DIAGNOSIS — X501XXA Overexertion from prolonged static or awkward postures, initial encounter: Secondary | ICD-10-CM | POA: Insufficient documentation

## 2020-11-02 MED ORDER — ACETAMINOPHEN 325 MG PO TABS: 650.0000 mg | ORAL_TABLET | Freq: Four times a day (QID) | ORAL | 0 refills | Status: DC | PRN

## 2020-11-02 MED ORDER — IBUPROFEN 800 MG PO TABS: 800.0000 mg | ORAL_TABLET | Freq: Once | ORAL | Status: DC

## 2020-11-02 MED ORDER — IBUPROFEN 600 MG PO TABS: 600.0000 mg | ORAL_TABLET | Freq: Four times a day (QID) | ORAL | 0 refills | Status: DC | PRN

## 2020-11-02 MED ORDER — ACETAMINOPHEN 325 MG PO TABS: 650.0000 mg | ORAL_TABLET | Freq: Once | ORAL | Status: DC

## 2020-11-02 NOTE — ED Notes (Signed)
Patient called three times for vitals recheck with no response and not visible in lobby

## 2020-11-02 NOTE — ED Triage Notes (Signed)
Tripped and injured her L ankle yesterday. Swelling noted. She went to [facility] and had an xray but left before being seen.

## 2020-11-02 NOTE — ED Provider Notes (Signed)
[facility] EMERGENCY DEPARTMENT Provider Note   CSN: [id number] Arrival date & time: 11/02/20  4008     History Chief Complaint  Patient presents with  . Ankle Injury    Meagan Flores is a 54 y.o. female present emerge department left ankle pain.  The patient reports that yesterday she twisted her ankle in a tree root.  She was having significant pain in her left foot but was able to bear weight.  She went to [facility] emergency department last night, had x-rays done from triage, but left without being seen due to a prolonged waiting time.  She presents to the ED today for follow-up.  She denies any history of fractures or injuries to the ankle prior to this.  She is not on blood thinners.  HPI     Past Medical History:  Diagnosis Date  . COVID-19 virus infection 02/14/2020  . Hyperlipidemia   . Hypothyroidism   . Prediabetes   . Stroke ([place])   . Thyroid disease   . TIA (transient ischemic attack) 04/15/2018    Patient Active Problem List   Diagnosis Date Noted  . Tendinopathy of right biceps tendon 09/30/2020  . 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) tear of right shoulder 09/30/2020  . Rotator cuff impingement syndrome of right shoulder 09/30/2020  . 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 Aneurysm ([place]) 03/18/2020  . History of TIA (transient ischemic attack) 02/14/2020  . History of stroke   . Mixed hyperlipidemia   . Essential hypertension   . Impingement syndrome of right shoulder 12/24/2016  . Tobacco use 09/28/2016  . Glenohumeral arthritis 12/27/2014  . 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 PARTIAL HYSTERECTOMY    . right arm sx    . SHOULDER ARTHROSCOPY WITH DISTAL CLAVICLE RESECTION Right 01/12/2017   Procedure: RIGHT SHOULDER ARTHROSCOPY WITH  SUBACROMIAL DECOMPRESSION, DISTAL CLAVICLE EXCISION, extensive debridement;   Surgeon: Leandrew Koyanagi, MD;  Location: [facility];  Service: Orthopedics;  Laterality: Right;  . THYMECTOMY  2004  . THYROIDECTOMY       OB History    Gravida  3   Para  3   Term  3   Preterm  0   AB  0   Living  3     SAB  0   TAB  0   Ectopic  0   Multiple  0   Live Births              Family History  Problem Relation Age of Onset  . Stomach cancer Sister   . Colon polyps Sister   . Esophageal cancer Neg Hx   . Rectal cancer Neg Hx   . Colon cancer Neg Hx   . Breast cancer Neg Hx     Social History   Tobacco Use  . Smoking status: Current Every Day Smoker    Packs/day: 0.25    Years: 34.00    Pack years: 8.50  . Smokeless tobacco: Never Used  Vaping Use  . Vaping Use: Never used  Substance Use Topics  . Alcohol use: Yes    Comment: occassional  . Drug use: No    Home Medications Prior to Admission medications   Medication Sig Start Date End Date Taking? Authorizing Provider  acetaminophen (TYLENOL) 325 MG tablet Take 2 tablets (650 mg total) by mouth every 6 (six) hours as needed  for up to 30 doses for mild pain or moderate pain. 11/02/20   Wyvonnia Dusky, MD  acetaminophen-codeine (TYLENOL #3) 300-30 MG tablet Take 1 tablet by mouth every 6 (six) hours as needed. 09/10/20   [provider]  aspirin EC 81 MG tablet Take 1 tablet (81 mg total) by mouth daily. 02/14/20   Elsie Stain, MD  atorvastatin (LIPITOR) 40 MG tablet Take 1 tablet (40 mg total) by mouth daily at 6 PM. 10/21/20   Elsie Stain, MD  ibuprofen (ADVIL) 600 MG tablet Take 1 tablet (600 mg total) by mouth every 6 (six) hours as needed for up to 30 doses for mild pain or moderate pain. 11/02/20   Wyvonnia Dusky, MD  levothyroxine (SYNTHROID) 50 MCG tablet Take 2 tablets (100 mcg total) by mouth daily. 10/21/20 12/20/20  Elsie Stain, MD    Allergies    Sulfa antibiotics  Review of Systems   Review of Systems  Constitutional: Negative  for chills and fever.  Respiratory: Negative for cough and shortness of breath.   Cardiovascular: Negative for chest pain and palpitations.  Gastrointestinal: Negative for abdominal pain and vomiting.  Musculoskeletal: Positive for arthralgias, gait problem and myalgias.  Skin: Negative for rash and wound.  Neurological: Negative for weakness and numbness.  Psychiatric/Behavioral: Negative for agitation and confusion.  All other systems reviewed and are negative.   Physical Exam Updated Vital Signs BP (!) 137/91 (BP Location: Right Arm)   Pulse 81   Temp 99.1 F (37.3 C) (Oral)   Resp 18   SpO2 97%   Physical Exam Vitals and nursing note reviewed.  Constitutional:      General: She is not in acute distress.    Appearance: She is well-developed.  HENT:     Head: Normocephalic and atraumatic.  Eyes:     Conjunctiva/sclera: Conjunctivae normal.  Cardiovascular:     Rate and Rhythm: Normal rate and regular rhythm.     Pulses: Normal pulses.  Pulmonary:     Effort: Pulmonary effort is normal. No respiratory distress.  Abdominal:     Palpations: Abdomen is soft.     Tenderness: There is no abdominal tenderness.  Musculoskeletal:     Cervical back: Neck supple.     Comments: Swelling and ttp of anterior talofibular ligament of left ankle No gross deformity noted  Skin:    General: Skin is warm and dry.  Neurological:     General: No focal deficit present.     Mental Status: She is alert and oriented to person, place, and time.     Sensory: No sensory deficit.     Motor: No weakness.  Psychiatric:        Mood and Affect: Mood normal.        Behavior: Behavior normal.     ED Results / Procedures / Treatments   Labs (all labs ordered are listed, but only abnormal results are displayed) Labs Reviewed - No data to display  EKG None  Radiology DG Ankle Complete Left  Result Date: 11/01/2020 CLINICAL DATA:  Patient tripped and injured ankle EXAM: LEFT ANKLE COMPLETE  - 3+ VIEW COMPARISON:  None. FINDINGS: There is no evidence of fracture, dislocation, or joint effusion. There is no evidence of arthropathy or other focal bone abnormality. Tissue swelling seen over the malleolus. IMPRESSION: Negative. Electronically Signed   By: Prudencio Pair M.D.   On: 11/01/2020 22:33    Procedures Procedures (including critical care time)  Medications Ordered in ED Medications - No data to display  ED Course  I have reviewed the triage vital signs and the nursing notes.  Pertinent labs & imaging results that were available during my care of the patient were reviewed by me and considered in my medical decision making (see chart for details).  This is a 54 year old female present emerge department left ankle pain after twisting her ankle yesterday.  I reviewed the x-rays were performed last night.  There is no acute fracture noted on this.  She likely has a sprain of the anterior talofibular ligament.  Given her degree of discomfort, we can place her in a cam boot for extra support, described conservative management.  I expect the swelling to go down and the pain to improve within a week or 2.  However, I explained to her, if her pain has not improved at all in 1 week, and she still having difficulty bearing weight, she can call make an appointment in orthopedic doctor.     Final Clinical Impression(s) / ED Diagnoses Final diagnoses:  Sprain of left ankle, unspecified ligament, initial encounter    Rx / DC Orders ED Discharge Orders         Ordered    ibuprofen (ADVIL) 600 MG tablet  Every 6 hours PRN        11/02/20 0941    acetaminophen (TYLENOL) 325 MG tablet  Every 6 hours PRN        11/02/20 0941           Wyvonnia Dusky, MD 11/03/20 1048

## 2020-11-02 NOTE — Discharge Instructions (Signed)
Call the orthopedic doctor (Dr Raeford Razor) in 7 days to make an appointment if your pain hasn't improved at all, or you are still having difficulty putting full weight on your ankle.  Most people recover from an ankle sprain in 2 weeks with conservative management.  Put the leg up at home, put ice on it for 10 minutes at a time, and take ibuprofen & tylenol as needed for pain.  You should wear the CAM boot when walking for the next 1-2 weeks. You can take this off when showering, sleeping, and at rest.

## 2020-11-04 ENCOUNTER — Emergency Department (HOSPITAL_BASED_OUTPATIENT_CLINIC_OR_DEPARTMENT_OTHER): Payer: Commercial Managed Care - PPO

## 2020-11-04 ENCOUNTER — Encounter (HOSPITAL_BASED_OUTPATIENT_CLINIC_OR_DEPARTMENT_OTHER): Payer: Self-pay

## 2020-11-04 ENCOUNTER — Emergency Department (HOSPITAL_BASED_OUTPATIENT_CLINIC_OR_DEPARTMENT_OTHER)
Admission: EM | Admit: 2020-11-04 | Discharge: 2020-11-04 | Disposition: A | Discharge status: Home / Self Care | Payer: Commercial Managed Care - PPO | Attending: Emergency Medicine | Admitting: Emergency Medicine

## 2020-11-04 ENCOUNTER — Other Ambulatory Visit: Payer: Self-pay

## 2020-11-04 DIAGNOSIS — Z79899 Other long term (current) drug therapy: Secondary | ICD-10-CM | POA: Insufficient documentation

## 2020-11-04 DIAGNOSIS — I10 Essential (primary) hypertension: Secondary | ICD-10-CM | POA: Diagnosis not present

## 2020-11-04 DIAGNOSIS — Z7982 Long term (current) use of aspirin: Secondary | ICD-10-CM | POA: Insufficient documentation

## 2020-11-04 DIAGNOSIS — F17200 Nicotine dependence, unspecified, uncomplicated: Secondary | ICD-10-CM | POA: Diagnosis not present

## 2020-11-04 DIAGNOSIS — E039 Hypothyroidism, unspecified: Secondary | ICD-10-CM | POA: Diagnosis not present

## 2020-11-04 DIAGNOSIS — Z8616 Personal history of COVID-19: Secondary | ICD-10-CM | POA: Insufficient documentation

## 2020-11-04 DIAGNOSIS — R0789 Other chest pain: Secondary | ICD-10-CM | POA: Diagnosis not present

## 2020-11-04 DIAGNOSIS — Z8673 Personal history of transient ischemic attack (TIA), and cerebral infarction without residual deficits: Secondary | ICD-10-CM | POA: Diagnosis not present

## 2020-11-04 LAB — COMPREHENSIVE METABOLIC PANEL
ALT: 13 U/L (ref 0–44)
AST: 16 U/L (ref 15–41)
Albumin: 3.8 g/dL (ref 3.5–5.0)
Alkaline Phosphatase: 69 U/L (ref 38–126)
Anion gap: 11 (ref 5–15)
BUN: 15 mg/dL (ref 6–20)
CO2: 26 mmol/L (ref 22–32)
Calcium: 8.1 mg/dL — ABNORMAL LOW (ref 8.9–10.3)
Chloride: 103 mmol/L (ref 98–111)
Creatinine, Ser: 0.78 mg/dL (ref 0.44–1.00)
GFR, Estimated: >60 mL/min (ref >60)
Glucose, Bld: 113 mg/dL — ABNORMAL HIGH (ref 70–99)
Potassium: 3.5 mmol/L (ref 3.5–5.1)
Sodium: 140 mmol/L (ref 135–145)
Total Bilirubin: 0.4 mg/dL (ref 0.3–1.2)
Total Protein: 7.4 g/dL (ref 6.5–8.1)

## 2020-11-04 LAB — CBC WITH DIFFERENTIAL/PLATELET
Abs Immature Granulocytes: 0.02 10*3/uL (ref 0.00–0.07)
Basophils Absolute: 0 10*3/uL (ref 0.0–0.1)
Basophils Relative: 0 %
Eosinophils Absolute: 0.1 10*3/uL (ref 0.0–0.5)
Eosinophils Relative: 1 %
HCT: 36.3 % (ref 36.0–46.0)
Hemoglobin: 11.7 g/dL — ABNORMAL LOW (ref 12.0–15.0)
Immature Granulocytes: 0 %
Lymphocytes Relative: 29 %
Lymphs Abs: 2.1 10*3/uL (ref 0.7–4.0)
MCH: 24.1 pg — ABNORMAL LOW (ref 26.0–34.0)
MCHC: 32.2 g/dL (ref 30.0–36.0)
MCV: 74.8 fL — ABNORMAL LOW (ref 80.0–100.0)
Monocytes Absolute: 0.5 10*3/uL (ref 0.1–1.0)
Monocytes Relative: 7 %
Neutro Abs: 4.5 10*3/uL (ref 1.7–7.7)
Neutrophils Relative %: 63 %
Platelets: 233 10*3/uL (ref 150–400)
RBC: 4.85 MIL/uL (ref 3.87–5.11)
RDW: 15.2 % (ref 11.5–15.5)
WBC: 7.3 10*3/uL (ref 4.0–10.5)
nRBC: 0 % (ref 0.0–0.2)

## 2020-11-04 LAB — TROPONIN I (HIGH SENSITIVITY): Troponin I (High Sensitivity): 3 ng/L (ref <18)

## 2020-11-04 MED ORDER — IOHEXOL 350 MG/ML SOLN
100.0000 mL | Freq: Once | INTRAVENOUS | Status: AC | PRN
  Administered 2020-11-04: 100 mL via INTRAVENOUS

## 2020-11-04 NOTE — ED Triage Notes (Addendum)
Pt c/o CP started yesterday-denies fever/flu sx-states she LWBS [place] yesterday-NAD-slow steady gait with left LE cam walker

## 2020-11-04 NOTE — Progress Notes (Signed)
CT pending IV  

## 2020-11-04 NOTE — Discharge Instructions (Addendum)
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  Please follow up with your family doctor for recheck and let them know you had the scan performed.

## 2020-11-04 NOTE — ED Notes (Signed)
Patient transported to X-ray 

## 2020-11-04 NOTE — ED Provider Notes (Signed)
[facility] EMERGENCY DEPARTMENT Provider Note   CSN: [id number] Arrival date & time: 11/04/20  1308     History Chief Complaint  Patient presents with  . Chest Pain    Meagan Flores is a 54 y.o. female.  The history is provided by the patient and medical records.  Chest Pain  Meagan Flores is a 54 y.o. female who presents to the Emergency Department complaining of chest pain.  She reports central and left sided chest pain that started yesterday.  Pain is sharp and worse with breathing.  Pain is also worse with moving.  She twisted her ankle on Saturday but did not fall during that event.    Denies fever, sob, cough.  She had nausea yesterday - now improved.  Denies abdominal pain, dysuria.  She does have LLE edema due to recent ankle sprain and is in a walking boot.    She has a hx/o thyroid disease.  Smokes tobacco.  Does not take hormones.  No personal hx/o CAD or DVT/PE.      Past Medical History:  Diagnosis Date  . COVID-19 virus infection 02/14/2020  . Hyperlipidemia   . Hypothyroidism   . Prediabetes   . Stroke ([place])   . Thyroid disease   . TIA (transient ischemic attack) 04/15/2018    Patient Active Problem List   Diagnosis Date Noted  . Tendinopathy of right biceps tendon 09/30/2020  . 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) tear of right shoulder 09/30/2020  . Rotator cuff impingement syndrome of right shoulder 09/30/2020  . 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 Aneurysm ([place]) 03/18/2020  . History of TIA (transient ischemic attack) 02/14/2020  . History of stroke   . Mixed hyperlipidemia   . Essential hypertension   . Impingement syndrome of right shoulder 12/24/2016  . Tobacco use 09/28/2016  . Glenohumeral arthritis 12/27/2014  . 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 PARTIAL HYSTERECTOMY    . right arm sx    .  SHOULDER ARTHROSCOPY WITH DISTAL CLAVICLE RESECTION Right 01/12/2017   Procedure: RIGHT SHOULDER ARTHROSCOPY WITH  SUBACROMIAL DECOMPRESSION, DISTAL CLAVICLE EXCISION, extensive debridement;  Surgeon: Leandrew Koyanagi, MD;  Location: [facility];  Service: Orthopedics;  Laterality: Right;  . THYMECTOMY  2004  . THYROIDECTOMY       OB History    Gravida  3   Para  3   Term  3   Preterm  0   AB  0   Living  3     SAB  0   TAB  0   Ectopic  0   Multiple  0   Live Births              Family History  Problem Relation Age of Onset  . Stomach cancer Sister   . Colon polyps Sister   . Esophageal cancer Neg Hx   . Rectal cancer Neg Hx   . Colon cancer Neg Hx   . Breast cancer Neg Hx     Social History   Tobacco Use  . Smoking status: Current Every Day Smoker    Packs/day: 0.25    Years: 34.00    Pack years: 8.50  . Smokeless tobacco: Never Used  Vaping Use  . Vaping Use: Never used  Substance Use Topics  . Alcohol use: Yes    Comment: occassional  . Drug use:  No    Home Medications Prior to Admission medications   Medication Sig Start Date End Date Taking? Authorizing Provider  acetaminophen (TYLENOL) 325 MG tablet Take 2 tablets (650 mg total) by mouth every 6 (six) hours as needed for up to 30 doses for mild pain or moderate pain. 11/02/20   Wyvonnia Dusky, MD  acetaminophen-codeine (TYLENOL #3) 300-30 MG tablet Take 1 tablet by mouth every 6 (six) hours as needed. 09/10/20   [provider]  aspirin EC 81 MG tablet Take 1 tablet (81 mg total) by mouth daily. 02/14/20   Elsie Stain, MD  atorvastatin (LIPITOR) 40 MG tablet Take 1 tablet (40 mg total) by mouth daily at 6 PM. 10/21/20   Elsie Stain, MD  ibuprofen (ADVIL) 600 MG tablet Take 1 tablet (600 mg total) by mouth every 6 (six) hours as needed for up to 30 doses for mild pain or moderate pain. 11/02/20   Wyvonnia Dusky, MD  levothyroxine (SYNTHROID) 50 MCG tablet Take  2 tablets (100 mcg total) by mouth daily. 10/21/20 12/20/20  Elsie Stain, MD    Allergies    Sulfa antibiotics  Review of Systems   Review of Systems  Cardiovascular: Positive for chest pain.  All other systems reviewed and are negative.   Physical Exam Updated Vital Signs BP (!) 152/104 (BP Location: Left Arm)   Pulse 67   Temp 98.5 F (36.9 C) (Oral)   Resp 18   Ht 5\' 8"  (1.727 m)   Wt 87.1 kg   SpO2 98%   BMI 29.19 kg/m   Physical Exam Vitals and nursing note reviewed.  Constitutional:      Appearance: She is well-developed.  HENT:     Head: Normocephalic and atraumatic.  Cardiovascular:     Rate and Rhythm: Normal rate and regular rhythm.     Heart sounds: No murmur heard.   Pulmonary:     Effort: Pulmonary effort is normal. No respiratory distress.     Breath sounds: Normal breath sounds.     Comments: Left sided chest wall tenderness Abdominal:     Palpations: Abdomen is soft.     Tenderness: There is no abdominal tenderness. There is no guarding or rebound.  Musculoskeletal:        General: No swelling or tenderness.     Comments: Walking boot to left foot/ankle.   Skin:    General: Skin is warm and dry.  Neurological:     Mental Status: She is alert and oriented to person, place, and time.  Psychiatric:        Behavior: Behavior normal.     ED Results / Procedures / Treatments   Labs (all labs ordered are listed, but only abnormal results are displayed) Labs Reviewed  CBC WITH DIFFERENTIAL/PLATELET - Abnormal; Notable for the following components:      Result Value   Hemoglobin 11.7 (*)    MCV 74.8 (*)    MCH 24.1 (*)    All other components within normal limits  COMPREHENSIVE METABOLIC PANEL - Abnormal; Notable for the following components:   Glucose, Bld 113 (*)    Calcium 8.1 (*)    All other components within normal limits  TROPONIN I (HIGH SENSITIVITY)    EKG EKG Interpretation  Date/Time:  Tuesday November 04 2020 13:12:14  EST Ventricular Rate:  80 PR Interval:  168 QRS Duration: 96 QT Interval:  380 QTC Calculation: 438 R Axis:   -72 Text Interpretation:  Normal sinus rhythm Pulmonary disease pattern Incomplete right bundle branch block Left anterior fascicular block Nonspecific T wave abnormality Abnormal ECG Confirmed by Quintella Reichert [phone]) on 11/04/2020 3:17:36 PM   Radiology DG Chest 2 View  Result Date: 11/04/2020 CLINICAL DATA:  Acute chest pain. EXAM: CHEST - 2 VIEW COMPARISON:  06/25/2020. FINDINGS: Prior median sternotomy. Cardiomegaly with mild pulmonary venous congestion. Low lung volumes with very mild basilar interstitial prominence no pleural effusion or pneumothorax. No acute bony abnormality. IMPRESSION: 1. Prior median sternotomy. Cardiomegaly with mild pulmonary venous congestion. 2. Low lung volumes with very mild basilar interstitial prominence. Electronically Signed   By: Marcello Moores  Register   On: 11/04/2020 13:58   CT Angio Chest PE W/Cm &/Or Wo Cm  Result Date: 11/04/2020 CLINICAL DATA:  Chest pain since yesterday, had COVID-19 on 02/14/2020, question pulmonary embolism, high clinical suspicion EXAM: CT ANGIOGRAPHY CHEST WITH CONTRAST TECHNIQUE: Multidetector CT imaging of the chest was performed using the standard protocol during bolus administration of intravenous contrast. Multiplanar CT image reconstructions and MIPs were obtained to evaluate the vascular anatomy. CONTRAST:  1110mL OMNIPAQUE IOHEXOL 350 MG/ML SOLN IV COMPARISON:  None FINDINGS: Cardiovascular: Atherosclerotic calcifications aorta without aneurysm or dissection. Dilated main pulmonary artery 4.0 cm diameter. Enlargement of cardiac chambers. No pericardial effusion. Pulmonary arteries adequately opacified and patent. No evidence of pulmonary embolism. Mediastinum/Nodes: Base of cervical region normal appearance. No thoracic adenopathy. Esophagus unremarkable. Lungs/Pleura: Mild dependent atelectasis in the posterior lower  lobes. Remaining lungs clear. No infiltrate, pleural effusion or pneumothorax. Upper Abdomen: Visualized upper abdomen unremarkable. Musculoskeletal: Wires at manubrium.  No acute osseous findings. Review of the MIP images confirms the above findings. IMPRESSION: No evidence of pulmonary embolism. Dilated main pulmonary artery 4.0 cm diameter, can be seen with pulmonary arterial hypertension. Minimal dependent atelectasis in BILATERAL lower lobes. No additional intrathoracic abnormalities. Aortic Atherosclerosis (ICD10-I70.0). Electronically Signed   By: Lavonia Dana M.D.   On: 11/04/2020 17:42    Procedures Procedures (including critical care time)  Medications Ordered in ED Medications  iohexol (OMNIPAQUE) 350 MG/ML injection 100 mL (100 mLs Intravenous Contrast Given 11/04/20 1658)    ED Course  I have reviewed the triage vital signs and the nursing notes.  Pertinent labs & imaging results that were available during my care of the patient were reviewed by me and considered in my medical decision making (see chart for details).    MDM Rules/Calculators/A&P                         patient with recent ankle sprain and foot immobilization here for evaluation of pleuritic chest pain. EKG without acute ischemic changes. Troponin is negative. Given her immobilization a CTA was obtained, which is negative for PE. Discussed with patient incidental findings on CT of enlarged pulmonary artery and atherosclerosis. She does have a reproducible component to her chest pain, question chest wall strain following her recent fall. Discussed with patient home care for chest wall pain. Discussed outpatient follow-up and return precautions.  Final Clinical Impression(s) / ED Diagnoses Final diagnoses:  Chest wall pain    Rx / DC Orders ED Discharge Orders    None       Quintella Reichert, MD 11/04/20 1812

## 2020-11-12 ENCOUNTER — Telehealth: Payer: Self-pay | Admitting: Orthopaedic Surgery

## 2020-11-12 NOTE — Telephone Encounter (Signed)
Pt called stating she has some questions about her upcoming surgery and would like a CB please  [phone]

## 2020-11-12 NOTE — Telephone Encounter (Signed)
I called patient and answered questions.

## 2020-11-17 ENCOUNTER — Telehealth: Payer: Self-pay | Admitting: Orthopaedic Surgery

## 2020-11-17 NOTE — Telephone Encounter (Signed)
Pt aware referral was sent as urgent and someone should be calling her to get scheduled. Advised pt if does not hear by thurs to call me and I will call to find out status

## 2020-11-17 NOTE — Telephone Encounter (Signed)
Patient called about a referral from Dr. Erlinda Hong for cardiology. Pt has an surgery on 12/29 and need to know if she is getting this referral before her surgery date. Please call pt right away at [phone].

## 2020-11-17 NOTE — Progress Notes (Signed)
Reviewed pt's chart with Dr. Royce Macadamia at [facility]. Pt has had two separate ED visits related to chest pain and would need to be worked up by a cardiologist for a stress test/ myocardial risk for outpatient day surgery to be appropriate. Sherrie at Dr. Phoebe Sharps office aware.

## 2020-11-20 ENCOUNTER — Encounter (HOSPITAL_BASED_OUTPATIENT_CLINIC_OR_DEPARTMENT_OTHER): Payer: Self-pay | Admitting: Orthopaedic Surgery

## 2020-11-20 ENCOUNTER — Other Ambulatory Visit: Payer: Self-pay

## 2020-11-24 ENCOUNTER — Other Ambulatory Visit (HOSPITAL_COMMUNITY)
Admission: RE | Admit: 2020-11-24 | Discharge: 2020-11-24 | Disposition: A | Discharge status: Home / Self Care | Payer: Commercial Managed Care - PPO | Source: Ambulatory Visit | Attending: Orthopaedic Surgery | Admitting: Orthopaedic Surgery

## 2020-11-24 DIAGNOSIS — E785 Hyperlipidemia, unspecified: Secondary | ICD-10-CM | POA: Insufficient documentation

## 2020-11-24 DIAGNOSIS — Z20822 Contact with and (suspected) exposure to covid-19: Secondary | ICD-10-CM | POA: Diagnosis not present

## 2020-11-24 DIAGNOSIS — E079 Disorder of thyroid, unspecified: Secondary | ICD-10-CM | POA: Insufficient documentation

## 2020-11-24 DIAGNOSIS — Z01812 Encounter for preprocedural laboratory examination: Secondary | ICD-10-CM | POA: Diagnosis present

## 2020-11-24 DIAGNOSIS — R7303 Prediabetes: Secondary | ICD-10-CM | POA: Insufficient documentation

## 2020-11-24 DIAGNOSIS — I639 Cerebral infarction, unspecified: Secondary | ICD-10-CM | POA: Insufficient documentation

## 2020-11-24 DIAGNOSIS — E039 Hypothyroidism, unspecified: Secondary | ICD-10-CM | POA: Insufficient documentation

## 2020-11-24 LAB — SARS CORONAVIRUS 2 (TAT 6-24 HRS): SARS Coronavirus 2: NEGATIVE

## 2020-11-24 NOTE — Progress Notes (Signed)

## 2020-11-25 ENCOUNTER — Encounter: Payer: Self-pay | Admitting: Cardiology

## 2020-11-25 ENCOUNTER — Ambulatory Visit (INDEPENDENT_AMBULATORY_CARE_PROVIDER_SITE_OTHER): Payer: Commercial Managed Care - PPO | Admitting: Cardiology

## 2020-11-25 ENCOUNTER — Other Ambulatory Visit: Payer: Self-pay

## 2020-11-25 VITALS — BP 122/86 | HR 67 | Ht 68.0 in | Wt 197.8 lb

## 2020-11-25 DIAGNOSIS — G459 Transient cerebral ischemic attack, unspecified: Secondary | ICD-10-CM

## 2020-11-25 DIAGNOSIS — I272 Pulmonary hypertension, unspecified: Secondary | ICD-10-CM

## 2020-11-25 DIAGNOSIS — I10 Essential (primary) hypertension: Secondary | ICD-10-CM | POA: Diagnosis not present

## 2020-11-25 DIAGNOSIS — Z01810 Encounter for preprocedural cardiovascular examination: Secondary | ICD-10-CM

## 2020-11-25 DIAGNOSIS — R7303 Prediabetes: Secondary | ICD-10-CM

## 2020-11-25 HISTORY — DX: Pulmonary hypertension, unspecified: I27.20

## 2020-11-25 HISTORY — DX: Encounter for preprocedural cardiovascular examination: Z01.810

## 2020-11-25 NOTE — Patient Instructions (Signed)
Medication Instructions:  Your physician recommends that you continue on your current medications as directed. Please refer to the Current Medication list given to you today.  *If you need a refill on your cardiac medications before your next appointment, please call your pharmacy*   Lab Work: None If you have labs (blood work) drawn today and your tests are completely normal, you will receive your results only by: MyChart Message (if you have MyChart) OR A paper copy in the mail If you have any lab test that is abnormal or we need to change your treatment, we will call you to review the results.   Testing/Procedures: Your physician has requested that you have an echocardiogram. Echocardiography is a painless test that uses sound waves to create images of your heart. It provides your doctor with information about the size and shape of your heart and how well your heart's chambers and valves are working. This procedure takes approximately one hour. There are no restrictions for this procedure.    Follow-Up: At [facility], you and your health needs are our priority.  As part of our continuing mission to provide you with exceptional heart care, we have created designated Provider Care Teams.  These Care Teams include your primary Cardiologist (physician) and Advanced Practice Providers (APPs -  Physician Assistants and Nurse Practitioners) who all work together to provide you with the care you need, when you need it.  We recommend signing up for the patient portal called "MyChart".  Sign up information is provided on this After Visit Summary.  MyChart is used to connect with patients for Virtual Visits (Telemedicine).  Patients are able to view lab/test results, encounter notes, upcoming appointments, etc.  Non-urgent messages can be sent to your provider as well.   To learn more about what you can do with MyChart, go to [email or web address].    Your next appointment:   1  month(s)  The format for your next appointment:   In Person  Provider:   Robert Krasowski, MD   Other Instructions    

## 2020-11-25 NOTE — Progress Notes (Signed)
Cardiology Consultation:    Date:  11/25/2020   ID:  Meagan Flores, DOB 13-Apr-1966, MRN [id number]  PCP:  Storm Frisk, MD  Cardiologist:  Gypsy Balsam, MD   Referring MD: Tarry Kos, MD   Chief Complaint  Patient presents with  . Medical Clearance 11/26/2020    History of Present Illness:    Meagan Flores is a 54 y.o. female who is being seen today for the evaluation of I need to have surgery for rotator cuff tomorrow at the request of Tarry Kos, MD.  She got very vague understanding of her problems she was referred to [place] because tomorrow she is scheduled to have rotatory cuff surgery on the right side.  This is a second surgery that she supposed to have from that shoulder for surgery happened 2 years ago.  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  Interestingly I did review her chart and in 2019 she ended up having TIA/CVA-like symptoms and CT of her chest and neck was done showing enlargement of the pulmonary artery measuring 44 mm. She said she got difficulty ambulating because of some history of left ankle injury.  She works physically and she gets tired doing it.  Does not exercise on the regular basis used to be very active but now she is not.  Denies having atypical chest tightness squeezing pressure burning in the chest.  Does have some shortness of breath. She continues to smoke she said she smokes about 1/3 pack/day. Does have history of dyslipidemia and takes statin for it. Does not have family history of premature coronary artery disease.  Past Medical History:  Diagnosis Date  . Aneurysm ([facility]) 03/18/2020  . 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 COVID-19 virus infection  02/14/2020  . 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) tear of right shoulder 09/30/2020  . Essential hypertension   . Glenohumeral arthritis 12/27/2014  . History of stroke   . History of TIA (transient ischemic attack) 02/14/2020  . Hyperlipidemia   . Hypothyroidism   . Impingement syndrome of right shoulder 12/24/2016  . Mixed hyperlipidemia   . 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 Prediabetes   . 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 Rotator cuff impingement syndrome of right shoulder 09/30/2020  . Stroke ([facility])   . Tendinopathy of right biceps tendon 09/30/2020  . Thyroid disease   . TIA (transient ischemic attack) 04/15/2018  . Tobacco use 09/28/2016    Past Surgical History:  Procedure Laterality Date  . PARTIAL HYSTERECTOMY    . right arm sx    . SHOULDER ARTHROSCOPY WITH DISTAL CLAVICLE RESECTION Right 01/12/2017   Procedure: RIGHT SHOULDER ARTHROSCOPY WITH  SUBACROMIAL DECOMPRESSION, DISTAL CLAVICLE EXCISION, extensive debridement;  Surgeon: Tarry Kos, MD;  Location: [facility];  Service: Orthopedics;  Laterality: Right;  . THYMECTOMY  2004  . THYROIDECTOMY      Current Medications: Current Meds  Medication Sig  . acetaminophen (TYLENOL) 325 MG tablet Take 2 tablets (650 mg total) by mouth every 6 (six) hours as needed for up to 30 doses for mild pain or moderate pain.  Marland Kitchen aspirin EC 81 MG tablet Take 1 tablet (81 mg total) by mouth daily.  Marland Kitchen atorvastatin (LIPITOR) 40  MG tablet Take 1 tablet (40 mg total) by mouth daily at 6 PM.  . ibuprofen (ADVIL) 600 MG tablet Take 1 tablet (600 mg total) by mouth every 6 (six) hours as needed for up to 30 doses for mild pain or moderate pain.  Marland Kitchen levothyroxine (SYNTHROID) 50 MCG tablet Take 2 tablets (100 mcg total) by mouth daily.     Allergies:   Sulfa antibiotics   Social History   Socioeconomic History  . Marital status: Married    Spouse name: Not on file  . Number of children: Not on file  .  Years of education: Not on file  . Highest education level: Not on file  Occupational History  . Not on file  Tobacco Use  . Smoking status: Current Every Day Smoker    Packs/day: 0.25    Years: 34.00    Pack years: 8.50  . Smokeless tobacco: Never Used  Vaping Use  . Vaping Use: Never used  Substance and Sexual Activity  . Alcohol use: Yes    Comment: occassional  . Drug use: No  . Sexual activity: Not on file  Other Topics Concern  . Not on file  Social History Narrative  . Not on file   Social Determinants of Health   Financial Resource Strain: Not on file  Food Insecurity: Not on file  Transportation Needs: Not on file  Physical Activity: Not on file  Stress: Not on file  Social Connections: Not on file     Family History: The patient's family history includes Colon polyps in her sister; Stomach cancer in her sister. There is no history of Esophageal cancer, Rectal cancer, Colon cancer, or Breast cancer. ROS:   Please see the history of present illness.    All 14 point review of systems negative except as described per history of present illness.  EKGs/Labs/Other Studies Reviewed:    The following studies were reviewed today: EKG done on December 7 show normal sinus rhythm, pulmonary disease pattern, incomplete right bundle branch block, left anterior fascicular block.  Non specific ST segment changes  Recent Labs: 10/21/2020: TSH 3.300 11/04/2020: ALT 13; BUN 15; Creatinine, Ser 0.78; Hemoglobin 11.7; Platelets 233; Potassium 3.5; Sodium 140  Recent Lipid Panel    Component Value Date/Time   CHOL 203 (H) 04/15/2018 0058   CHOL 214 (H) 08/05/2017 1645   TRIG 140 04/15/2018 0058   HDL 45 04/15/2018 0058   HDL 51 08/05/2017 1645   CHOLHDL 4.5 04/15/2018 0058   VLDL 28 04/15/2018 0058   LDLCALC 130 (H) 04/15/2018 0058   LDLCALC 145 (H) 08/05/2017 1645    Physical Exam:    VS:  BP 122/86 (BP Location: Right Arm, Patient Position: Sitting)   Pulse 67   Ht  5\' 8"  (1.727 m)   Wt 197 lb 12.8 oz (89.7 kg)   SpO2 96%   BMI 30.08 kg/m     Wt Readings from Last 3 Encounters:  11/25/20 197 lb 12.8 oz (89.7 kg)  11/04/20 192 lb (87.1 kg)  11/01/20 202 lb 13.2 oz (92 kg)     GEN:  Well nourished, well developed in no acute distress HEENT: Normal NECK: No JVD; No carotid bruits LYMPHATICS: No lymphadenopathy CARDIAC: RRR, no murmurs, no rubs, no gallops RESPIRATORY:  Clear to auscultation without rales, wheezing or rhonchi  ABDOMEN: Soft, non-tender, non-distended MUSCULOSKELETAL:  No edema; No deformity  SKIN: Warm and dry NEUROLOGIC:  Alert and oriented x 3 PSYCHIATRIC:  Normal affect  ASSESSMENT:    1. Essential hypertension   2. Preop cardiovascular exam   3. Pulmonary hypertension, unspecified ([place])   4. TIA (transient ischemic attack)   5. Prediabetes    PLAN:    In order of problems listed above:  1. Cardiovascular preop evaluation.  This is elective surgery she does have evidence of pulmonary hypertension based on the EKG as well as CT.  That situation need to be clarified before pursuing surgical intervention for this elective shoulder surgery.  Therefore, will consult surgery.  She will be scheduled to have an echocardiogram to assess pulmonary pressure. 2. Essential hypertension blood pressure seems to be well controlled today we will continue present management. 3. Dyslipidemia I did review her K PN which show me LDL 130 HDL 45 she is taking statin in form of Lipitor 40 mg daily.  I will call primary care physician to get latest fasting lipid profile. 4. Prediabetes.  I did review her K PN showing me her hemoglobin A1c of 5.8% this is from 10/21/2020.    Medication Adjustments/Labs and Tests Ordered: Current medicines are reviewed at length with the patient today.  Concerns regarding medicines are outlined above.  No orders of the defined types were placed in this encounter.  No orders of the defined types were placed  in this encounter.   Signed, Park Liter, MD, [facility]. 11/25/2020 2:09 PM    [place]

## 2020-11-26 ENCOUNTER — Ambulatory Visit (HOSPITAL_BASED_OUTPATIENT_CLINIC_OR_DEPARTMENT_OTHER)
Admission: RE | Admit: 2020-11-26 | Payer: Commercial Managed Care - PPO | Source: Home / Self Care | Admitting: Orthopaedic Surgery

## 2020-11-26 SURGERY — SHOULDER ARTHROSCOPY WITH SUBACROMIAL DECOMPRESSION, ROTATOR CUFF REPAIR AND BICEP TENDON REPAIR
Anesthesia: General | Site: Shoulder | Laterality: Right

## 2020-12-03 ENCOUNTER — Inpatient Hospital Stay: Payer: Commercial Managed Care - PPO | Admitting: Physician Assistant

## 2020-12-19 NOTE — Progress Notes (Signed)
Subjective:    Patient ID: Meagan Flores, female    DOB: 12-Aug-1966, 55 y.o.   MRN: [id number]  History of Present Illness: 02/14/20 This is a 55 year old female who was in the emergency department on 9 March with symptoms consistent with viral infection.  The patient initially came in with general malaise fever muscle aches and pain headaches and dizziness loss of taste and smell Covid test was positive the patient was not hypoxic chest x-ray did not show infiltrates patient was given supportive care fluid hydration and then discharged later in the day.  This is a post ER follow-up visit and also to establish in the clinic as the patient does not have a primary care provider.  This patient states she is improving over the last 24 hours.  Previously over the last several days she had episodes of diarrhea nausea and vomiting without abdominal pain.  She still lost her sense of taste.  She states that she has had cough is paroxysmal but her shortness of breath is resolved urine output is adequate urine is not concentrated.  She states her muscle aches and pains are resolving she still has some pain in the back and hurts some in the legs she also still has some fatigue.  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  Former smoker now only smoking the occasional amounts.  Previous history of stroke and had been on aspirin 81 mg daily and lipid therapy.  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  The only active medicine she has at this time includes over-the-counter zinc and vitamin C and she also has the Synthroid 125 mcg daily.  4/20 This patient returns today in follow-up after the first visit being a telehealth visit.  The patient did have Covid viral infection and is rapidly improved from this.  She still has some fatigue.  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  She is smoking now pack of cigarettes every 3 days.  Note the patient comes in today with adequate blood pressure and not requiring blood pressure medications.  The patient is on Synthroid 125 mcg daily without symptoms of hypothyroidism at this time.  Also is on aspirin and atorvastatin for stroke prevention  This patient has suffered lost during the pandemic and that her son was murdered in January she lost both her parents several months apart late in 2020 she is suffering grief reaction from these losses  04/15/2020 Patient seen in return follow-up and has had complete resolution of the COVID-19 viral infection.  The patient has not yet received the Covid vaccine and is giving this consideration.  Patient states her biggest issue now is right arm and elbow pain.  Her right dominant hand is numb in the ulnar distribution.  She had a cubital fossa surgery impingement release previously by a hand surgeon several years ago in fact almost 10 years ago.  On this occurred on the right elbow and also she has had impingement syndrome of the acromioclavicular joint with surgery arthroscopy done in 2018 by orthopedics locally.  She states her arm is hurting worse now.  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  Patient continues to smoke about a pack every 3 days.  He denies any shortness of breath cough or other  respiratory complaints.  She is wishing to have referral back to orthopedics.  Note at the last visit her thyroid function was normal and she is on Synthroid replacement.  Blood pressure at this visit is well controlled at 117/80.  07/29/2020 The patient is seen in return follow-up for hypertension, glenohumeral arthritis of the right shoulder, tobacco use.  Patient states in the interim she is still smoking 1 pack every 4 days of cigarettes.  She has difficulty falling asleep and will sleep late into the morning.  She is noted increased headaches  and palpitations.  She pulled a muscle in her left upper chest recently.  She never received the nicotine replacement therapy rep we recommended.  She is due a mammogram.  She notes increased stress anxiety and dysphoria.  She is not been on antidepressants before and is interested in receiving this.  She notes her right shoulder is still having some pain to take with movement.  She uses Tylenol for this and this seems to help.  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  Note her PHQ 9 score is very elevated at this visit.  09/10/2020 This is a telephone visit for this 55 year old female seen in follow-up after had previously been seen in August of this year.  The patient states she is having difficulty with her thyroid medication.  She states since being on the 125 mcg of the Synthroid she is feeling more jittery.  She has lost weight not intentionally.  She has lost her appetite.  She feels her heart is racing.  She is down to 7 to 8 cigarettes daily.  She still has right shoulder pain which is chronic.  She has a history of acromioclavicular joint impingement syndrome and continues to try to work under these conditions.  Her anxiety appears to be unchanged despite being on sertraline 50 mg daily.  10/21/2020 Patient is seen in return follow-up visit history of postoperative hypothyroidism we reduced her dose to 7 thyroid 100 mcg daily at the last visit she is now here for follow-up labs  Also history of glenohumeral arthritis with nerve impingement syndrome and tendinosis of the biceps tendon had she has been seen by orthopedics and she will need to undergo orthoscopic surgery of the shoulder per Dr. 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  She has a cardiology visit today for this.  She denies any significant hypersomnolence or snoring.  Patient is also yet to receive her mammogram.  She also is due a Covid booster.  Patient denies any other symptoms at this time.  Her ankle is improving and she just got out of the ankle boot.  She is still smoking 2 to 4 cigarettes daily.       Past Medical History:  Diagnosis Date  . Aneurysm ([place]) 03/18/2020  . 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 COVID-19 virus infection 02/14/2020  . 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) tear of right shoulder 09/30/2020  . Essential hypertension   . Glenohumeral arthritis 12/27/2014  . History of  stroke   . History of TIA (transient ischemic attack) 02/14/2020  . Hyperlipidemia   . Hypothyroidism   . Impingement syndrome of right shoulder 12/24/2016  . Mixed hyperlipidemia   . 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 Prediabetes   . 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 Rotator cuff impingement syndrome of right shoulder 09/30/2020  . Stroke ([place])   . Tendinopathy of right biceps tendon 09/30/2020  . Thyroid disease   . TIA (transient ischemic attack) 04/15/2018  . Tobacco use 09/28/2016     Family History  Problem Relation Age of Onset  . Stomach cancer Sister   . Colon polyps Sister   . Esophageal cancer Neg Hx   . Rectal cancer Neg Hx   . Colon cancer Neg Hx   . Breast cancer Neg Hx      Social History   Socioeconomic History  . Marital status: Married    Spouse name: Not on file  . Number of children: Not on file  . Years of education: Not on file  . Highest education level: Not on file  Occupational History  . Not on file  Tobacco Use  . Smoking status: Current Every Day Smoker    Packs/day: 0.25    Years: 34.00    Pack years: 8.50  .  Smokeless tobacco: Never Used  Vaping Use  . Vaping Use: Never used  Substance and Sexual Activity  . Alcohol use: Yes    Comment: occassional  . Drug use: No  . Sexual activity: Not on file  Other Topics Concern  . Not on file  Social History Narrative  . Not on file   Social Determinants of Health   Financial Resource Strain: Not on file  Food Insecurity: Not on file  Transportation Needs: Not on file  Physical Activity: Not on file  Stress: Not on file  Social Connections: Not on file  Intimate Partner Violence: Not on file     Allergies  Allergen Reactions  . Sulfa Antibiotics Hives     Outpatient Medications Prior to Visit  Medication Sig Dispense Refill  . acetaminophen (TYLENOL) 325 MG tablet Take 2 tablets (650 mg total) by mouth every 6 (six) hours as needed for up to 30 doses for mild pain or moderate pain. 30 tablet 0  . aspirin EC 81 MG tablet Take 1 tablet (81 mg total) by mouth daily. 60 tablet 3  . atorvastatin (LIPITOR) 40 MG tablet Take 1 tablet (40 mg total) by mouth daily at 6 PM. 90 tablet 1  . ibuprofen (ADVIL) 600 MG tablet Take 1 tablet (600 mg total) by mouth every 6 (six) hours as needed for up to 30 doses for mild pain or moderate pain. 30 tablet 0  . levothyroxine (SYNTHROID) 50 MCG tablet Take 2 tablets (100 mcg total) by mouth daily. 60 tablet 1   No facility-administered medications prior to visit.      Review of Systems  Cardiovascular: Negative for chest pain and palpitations.  Neurological: Negative for tremors, facial asymmetry, speech difficulty, weakness, light-headedness, numbness and headaches.  Psychiatric/Behavioral: Negative for decreased concentration, dysphoric mood, self-injury, sleep disturbance and suicidal ideas. The patient is not nervous/anxious and is not hyperactive.         Objective:   Physical Exam  Vitals:   12/22/20 0841  BP: (!) 143/83  Pulse: (!) 55  Temp: 98.1 F (36.7 C)  TempSrc: Oral  SpO2: 98%   Weight: 201 lb 6.4 oz (91.4 kg)  Gen: Pleasant, well-nourished, in no distress,  normal affect  ENT: No lesions,  mouth clear,  oropharynx clear, no postnasal drip  Neck: No JVD, no TMG, no carotid bruits  Lungs: No use of accessory muscles, no dullness to percussion, clear without rales or rhonchi  Cardiovascular: RRR, heart sounds normal, no murmur or gallops, no peripheral edema  Abdomen: soft and NT, no HSM,  BS normal  Musculoskeletal: No deformities, no cyanosis or clubbing  Neuro: alert, non focal  Skin: Warm, no lesions or rashes  No results found.     Assessment & Plan:  I personally reviewed all images and lab data in the [facility] system as well as any outside material available during thi  office visit and agree with the  radiology impressions.   Essential hypertension Hypertension not under treatment  Will begin valsartan  160 mg daily  Patient given a DASH diet  Pulmonary hypertension, unspecified ([place]) Recent CT angio did not show pulmonary emboli but did show dilated pulmonary artery consistent with pulmonary hypertension  Patient denies any dyspnea Possibility of sleep apnea playing a role with BMI of 30.6  Possibility of diastolic dysfunction with hypertension  Cardiology appointment today will follow up from there  TIA (transient ischemic attack) Continue lipid therapy  Postoperative hypothyroidism Postoperative hypothyroidism thyroid under good control continue current medication  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 Current smoking consumption amount: Down to 2-4 cigarettes daily  . Dicsussion on advise to quit smoking and smoking impacts: Importance of quitting smoking due to impacts on cardiovascular health and stroke recurrence  . Patient's willingness to quit: Is willing to quit  . Methods to quit smoking discussed:  We discussed behavioral modification the use of sertraline to help with her anxiety and depression and nicotine replacement therapy with lozenge 4 mg 3 times daily as needed  . Medication management of smoking session drugs discussed: I have prescribed nicotine replacement therapy  . Resources provided:  AVS   . Setting quit date date not set  . Follow-up arranged follow-up OV 4 months   Time spent counseling the patient: 5 minutes    [place] was seen today for hyperthyroidism.  Diagnoses and all orders for this visit:  Encounter for screening mammogram for malignant neoplasm of breast -     MM DIGITAL SCREENING BILATERAL; Future  Essential hypertension  Pulmonary hypertension, unspecified ([facility])  TIA (transient ischemic attack)  Postoperative hypothyroidism  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) tear of right shoulder  Tobacco use  Other orders -     valsartan (DIOVAN) 160 MG tablet; Take 1 tablet (160 mg total) by mouth daily. -     levothyroxine (SYNTHROID) 50 MCG tablet; Take 2 tablets (100 mcg total) by mouth daily.    Patient urged to get a breast screening exam this was rescheduled Patient urged to get her Covid booster vaccine

## 2020-12-22 ENCOUNTER — Ambulatory Visit (HOSPITAL_BASED_OUTPATIENT_CLINIC_OR_DEPARTMENT_OTHER)
Admission: RE | Admit: 2020-12-22 | Discharge: 2020-12-22 | Disposition: A | Discharge status: Home / Self Care | Payer: Commercial Managed Care - PPO | Source: Ambulatory Visit | Attending: Critical Care Medicine | Admitting: Critical Care Medicine

## 2020-12-22 ENCOUNTER — Encounter: Payer: Self-pay | Admitting: Critical Care Medicine

## 2020-12-22 ENCOUNTER — Ambulatory Visit: Payer: Commercial Managed Care - PPO

## 2020-12-22 ENCOUNTER — Other Ambulatory Visit: Payer: Self-pay

## 2020-12-22 ENCOUNTER — Ambulatory Visit (HOSPITAL_BASED_OUTPATIENT_CLINIC_OR_DEPARTMENT_OTHER)
Admission: RE | Admit: 2020-12-22 | Discharge: 2020-12-22 | Disposition: A | Discharge status: Home / Self Care | Payer: Commercial Managed Care - PPO | Source: Ambulatory Visit | Attending: Cardiology | Admitting: Cardiology

## 2020-12-22 ENCOUNTER — Encounter (HOSPITAL_BASED_OUTPATIENT_CLINIC_OR_DEPARTMENT_OTHER): Payer: Self-pay

## 2020-12-22 ENCOUNTER — Telehealth: Payer: Self-pay

## 2020-12-22 ENCOUNTER — Other Ambulatory Visit (HOSPITAL_BASED_OUTPATIENT_CLINIC_OR_DEPARTMENT_OTHER): Payer: Self-pay | Admitting: Critical Care Medicine

## 2020-12-22 ENCOUNTER — Ambulatory Visit: Payer: Commercial Managed Care - PPO | Attending: Critical Care Medicine | Admitting: Critical Care Medicine

## 2020-12-22 VITALS — BP 143/83 | HR 55 | Temp 98.1°F | Wt 201.4 lb

## 2020-12-22 DIAGNOSIS — Z1231 Encounter for screening mammogram for malignant neoplasm of breast: Secondary | ICD-10-CM

## 2020-12-22 DIAGNOSIS — G459 Transient cerebral ischemic attack, unspecified: Secondary | ICD-10-CM | POA: Diagnosis not present

## 2020-12-22 DIAGNOSIS — Z720 Tobacco use: Secondary | ICD-10-CM

## 2020-12-22 DIAGNOSIS — I10 Essential (primary) hypertension: Secondary | ICD-10-CM | POA: Diagnosis not present

## 2020-12-22 DIAGNOSIS — I272 Pulmonary hypertension, unspecified: Secondary | ICD-10-CM | POA: Diagnosis not present

## 2020-12-22 DIAGNOSIS — S43431A Superior glenoid labrum lesion of right shoulder, initial encounter: Secondary | ICD-10-CM

## 2020-12-22 DIAGNOSIS — E890 Postprocedural hypothyroidism: Secondary | ICD-10-CM

## 2020-12-22 LAB — ECHOCARDIOGRAM COMPLETE
Area-P 1/2: 2.12 cm2
S' Lateral: 3.04 cm
Weight: 3222.4 oz

## 2020-12-22 MED ORDER — VALSARTAN 160 MG PO TABS: 160.0000 mg | ORAL_TABLET | Freq: Every day | ORAL | 1 refills | Status: DC

## 2020-12-22 MED ORDER — LEVOTHYROXINE SODIUM 50 MCG PO TABS: 100.0000 ug | ORAL_TABLET | Freq: Every day | ORAL | 1 refills | Status: DC

## 2020-12-22 NOTE — Telephone Encounter (Signed)
Patient verified date of birth. She is aware that mammogram is normal. Repeat in 2 years. Nat Christen, CMA

## 2020-12-22 NOTE — Assessment & Plan Note (Signed)
Postoperative hypothyroidism thyroid under good control continue current medication

## 2020-12-22 NOTE — Assessment & Plan Note (Signed)
Continue lipid therapy

## 2020-12-22 NOTE — Telephone Encounter (Signed)
-----   Message from Elsie Stain, MD sent at 12/22/2020 11:49 AM EST ----- Let the patient know her mammogram was NEG and will recheck in one year

## 2020-12-22 NOTE — Patient Instructions (Signed)
Please obtain your mammogram this will be scheduled  Please get your Covid booster vaccine  Begin valsartan 1 pill daily for your elevated blood pressure  No other medication changes  All your medicine sent to your [facility] on smoking cessation as we discussed  Follow healthy diet as outlined below to keep your blood pressure down  Return to see Dr. Joya Gaskins 4 months   Tobacco Use Disorder Tobacco use disorder (TUD) occurs when a person craves, seeks, and uses tobacco, regardless of the consequences. This disorder can cause problems with mental and physical health. It can affect your ability to have healthy relationships, and it can keep you from meeting your responsibilities at work, home, or school. Tobacco may be:  Smoked as a cigarette or cigar.  Inhaled using e-cigarettes.  Smoked in a pipe or hookah.  Chewed as smokeless tobacco.  Inhaled into the nostrils as snuff. Tobacco products contain a dangerous chemical called nicotine, which is very addictive. Nicotine triggers hormones that make the body feel stimulated and works on areas of the brain that make you feel good. These effects can make it hard for people to quit nicotine. Tobacco contains many other unsafe chemicals that can damage almost every organ in the body. Smoking tobacco also puts others in danger due to fire risk and possible health problems caused by breathing in secondhand smoke. What are the signs or symptoms? Symptoms of TUD may include:  Being unable to slow down or stop your tobacco use.  Spending an abnormal amount of time getting or using tobacco.  Craving tobacco. Cravings may last for up to 6 months after quitting.  Tobacco use that: ? Interferes with your work, school, or home life. ? Interferes with your personal and social relationships. ? Makes you give up activities that you once enjoyed or found important.  Using tobacco even though you know that it is: ? Dangerous or  bad for your health or someone else's health. ? Causing problems in your life.  Needing more and more of the substance to get the same effect (developing tolerance).  Experiencing unpleasant symptoms if you do not use the substance (withdrawal). Withdrawal symptoms may include: ? Depressed, anxious, or irritable mood. ? Difficulty concentrating. ? Increased appetite. ? Restlessness or trouble sleeping.  Using the substance to avoid withdrawal. How is this diagnosed? This condition may be diagnosed based on:  Your current and past tobacco use. Your health care provider may ask questions about how your tobacco use affects your life.  A physical exam. You may be diagnosed with TUD if you have at least two symptoms within a 11-month period. How is this treated? This condition is treated by stopping tobacco use. Many people are unable to quit on their own and need help. Treatment may include:  Nicotine replacement therapy (NRT). NRT provides nicotine without the other harmful chemicals in tobacco. NRT gradually lowers the dosage of nicotine in the body and reduces withdrawal symptoms. NRT is available as: ? Over-the-counter gums, lozenges, and skin patches. ? Prescription mouth inhalers and nasal sprays.  Medicine that acts on the brain to reduce cravings and withdrawal symptoms.  A type of talk therapy that examines your triggers for tobacco use, how to avoid them, and how to cope with cravings (behavioral therapy).  Hypnosis. This may help with withdrawal symptoms.  Joining a support group for others coping with TUD. The best treatment for TUD is usually a combination of medicine, talk therapy, and support groups.  Recovery can be a long process. Many people start using tobacco again after stopping (relapse). If you relapse, it does not mean that treatment will not work. Follow these instructions at home: Lifestyle  Do not use any products that contain nicotine or tobacco, such as  cigarettes and e-cigarettes.  Avoid things that trigger tobacco use as much as you can. Triggers include people and situations that usually cause you to use tobacco.  Avoid drinks that contain caffeine, including coffee. These may worsen some withdrawal symptoms.  Find ways to manage stress. Wanting to smoke may cause stress, and stress can make you want to smoke. Relaxation techniques such as deep breathing, meditation, and yoga may help.  Attend support groups as needed. These groups are an important part of long-term recovery for many people. General instructions  Take over-the-counter and prescription medicines only as told by your health care provider.  Check with your health care provider before taking any new prescription or over-the-counter medicines.  Decide on a friend, family member, or smoking quit-line (such as 1-800-QUIT-NOW in the [place] that you can call or text when you feel the urge to smoke or when you need help coping with cravings.  Keep all follow-up visits as told by your health care provider and therapist. This is important.   Contact a health care provider if:  You are not able to take your medicines as prescribed.  Your symptoms get worse, even with treatment. Summary  Tobacco use disorder (TUD) occurs when a person craves, seeks, and uses tobacco regardless of the consequences.  This condition may be diagnosed based on your current and past tobacco use and a physical exam.  Many people are unable to quit on their own and need help. Recovery can be a long process.  The most effective treatment for TUD is usually a combination of medicine, talk therapy, and support groups. This information is not intended to replace advice given to you by your health care provider. Make sure you discuss any questions you have with your health care provider. Document Revised: 11/02/2017 Document Reviewed: 11/02/2017 Elsevier Patient Education  2021 [place].   [email or web address]">  DASH Eating Plan DASH stands for Dietary Approaches to Stop Hypertension. The DASH eating plan is a healthy eating plan that has been shown to:  Reduce high blood pressure (hypertension).  Reduce your risk for type 2 diabetes, heart disease, and stroke.  Help with weight loss. What are tips for following this plan? Reading food labels  Check food labels for the amount of salt (sodium) per serving. Choose foods with less than 5 percent of the Daily Value of sodium. Generally, foods with less than 300 milligrams (mg) of sodium per serving fit into this eating plan.  To find whole grains, look for the word "whole" as the first word in the ingredient list. Shopping  Buy products labeled as "low-sodium" or "no salt added."  Buy fresh foods. Avoid canned foods and pre-made or frozen meals. Cooking  Avoid adding salt when cooking. Use salt-free seasonings or herbs instead of table salt or sea salt. Check with your health care provider or pharmacist before using salt substitutes.  Do not fry foods. Cook foods using healthy methods such as baking, boiling, grilling, roasting, and broiling instead.  Cook with heart-healthy oils, such as olive, canola, avocado, soybean, or sunflower oil. Meal planning  Eat a balanced diet that includes: ? 4 or more servings of fruits and 4 or more servings of vegetables each day.  Try to fill one-half of your plate with fruits and vegetables. ? 6-8 servings of whole grains each day. ? Less than 6 oz (170 g) of lean meat, poultry, or fish each day. A 3-oz (85-g) serving of meat is about the same size as a deck of cards. One egg equals 1 oz (28 g). ? 2-3 servings of low-fat dairy each day. One serving is 1 cup (237 mL). ? 1 serving of nuts, seeds, or beans 5 times each week. ? 2-3 servings of heart-healthy fats. Healthy fats called omega-3 fatty acids are found in foods such as  walnuts, flaxseeds, fortified milks, and eggs. These fats are also found in cold-water fish, such as sardines, salmon, and mackerel.  Limit how much you eat of: ? Canned or prepackaged foods. ? Food that is high in trans fat, such as some fried foods. ? Food that is high in saturated fat, such as fatty meat. ? Desserts and other sweets, sugary drinks, and other foods with added sugar. ? Full-fat dairy products.  Do not salt foods before eating.  Do not eat more than 4 egg yolks a week.  Try to eat at least 2 vegetarian meals a week.  Eat more home-cooked food and less restaurant, buffet, and fast food.   Lifestyle  When eating at a restaurant, ask that your food be prepared with less salt or no salt, if possible.  If you drink alcohol: ? Limit how much you use to:  0-1 drink a day for women who are not pregnant.  0-2 drinks a day for men. ? Be aware of how much alcohol is in your drink. In the [place] one drink equals one 12 oz bottle of beer (355 mL), one 5 oz glass of wine (148 mL), or one 1 oz glass of hard liquor (44 mL). General information  Avoid eating more than 2,300 mg of salt a day. If you have hypertension, you may need to reduce your sodium intake to 1,500 mg a day.  Work with your health care provider to maintain a healthy body weight or to lose weight. Ask what an ideal weight is for you.  Get at least 30 minutes of exercise that causes your heart to beat faster (aerobic exercise) most days of the week. Activities may include walking, swimming, or biking.  Work with your health care provider or dietitian to adjust your eating plan to your individual calorie needs. What foods should I eat? Fruits All fresh, dried, or frozen fruit. Canned fruit in natural juice (without added sugar). Vegetables Fresh or frozen vegetables (raw, steamed, roasted, or grilled). Low-sodium or reduced-sodium tomato and vegetable juice. Low-sodium or reduced-sodium tomato sauce and tomato  paste. Low-sodium or reduced-sodium canned vegetables. Grains Whole-grain or whole-wheat bread. Whole-grain or whole-wheat pasta. Brown rice. [place]. Bulgur. Whole-grain and low-sodium cereals. Pita bread. Low-fat, low-sodium crackers. Whole-wheat flour tortillas. Meats and other proteins Skinless chicken or [place]. Ground chicken or [place]. Pork with fat trimmed off. Fish and seafood. Egg whites. Dried beans, peas, or lentils. Unsalted nuts, nut butters, and seeds. Unsalted canned beans. Lean cuts of beef with fat trimmed off. Low-sodium, lean precooked or cured meat, such as sausages or meat loaves. Dairy Low-fat (1%) or fat-free (skim) milk. Reduced-fat, low-fat, or fat-free cheeses. Nonfat, low-sodium ricotta or cottage cheese. Low-fat or nonfat yogurt. Low-fat, low-sodium cheese. Fats and oils Soft margarine without trans fats. Vegetable oil. Reduced-fat, low-fat, or light mayonnaise and salad dressings (reduced-sodium). Canola, safflower, olive, avocado, soybean, and  sunflower oils. Avocado. Seasonings and condiments Herbs. Spices. Seasoning mixes without salt. Other foods Unsalted popcorn and pretzels. Fat-free sweets. The items listed above may not be a complete list of foods and beverages you can eat. Contact a dietitian for more information. What foods should I avoid? Fruits Canned fruit in a light or heavy syrup. Fried fruit. Fruit in cream or butter sauce. Vegetables Creamed or fried vegetables. Vegetables in a cheese sauce. Regular canned vegetables (not low-sodium or reduced-sodium). Regular canned tomato sauce and paste (not low-sodium or reduced-sodium). Regular tomato and vegetable juice (not low-sodium or reduced-sodium). [name] Fava. Olives. Grains Baked goods made with fat, such as croissants, muffins, or some breads. Dry pasta or rice meal packs. Meats and other proteins Fatty cuts of meat. Ribs. Fried meat. Berniece Salines. Bologna, salami, and other precooked or cured meats,  such as sausages or meat loaves. Fat from the back of a pig (fatback). Bratwurst. Salted nuts and seeds. Canned beans with added salt. Canned or smoked fish. Whole eggs or egg yolks. Chicken or [place] with skin. Dairy Whole or 2% milk, cream, and half-and-half. Whole or full-fat cream cheese. Whole-fat or sweetened yogurt. Full-fat cheese. Nondairy creamers. Whipped toppings. Processed cheese and cheese spreads. Fats and oils Butter. Stick margarine. Lard. Shortening. Ghee. Bacon fat. Tropical oils, such as coconut, palm kernel, or palm oil. Seasonings and condiments Onion salt, garlic salt, seasoned salt, table salt, and sea salt. Worcestershire sauce. Tartar sauce. Barbecue sauce. Teriyaki sauce. Soy sauce, including reduced-sodium. Steak sauce. Canned and packaged gravies. Fish sauce. Oyster sauce. Cocktail sauce. Store-bought horseradish. Ketchup. Mustard. Meat flavorings and tenderizers. Bouillon cubes. Hot sauces. Pre-made or packaged marinades. Pre-made or packaged taco seasonings. Relishes. Regular salad dressings. Other foods Salted popcorn and pretzels. The items listed above may not be a complete list of foods and beverages you should avoid. Contact a dietitian for more information. Where to find more information  National Heart, Lung, and Blood Institute: https://wilson-eaton.com/  American Heart Association: www.heart.org  Academy of Nutrition and Dietetics: www.eatright.[place]: www.kidney.org Summary  The DASH eating plan is a healthy eating plan that has been shown to reduce high blood pressure (hypertension). It may also reduce your risk for type 2 diabetes, heart disease, and stroke.  When on the DASH eating plan, aim to eat more fresh fruits and vegetables, whole grains, lean proteins, low-fat dairy, and heart-healthy fats.  With the DASH eating plan, you should limit salt (sodium) intake to 2,300 mg a day. If you have hypertension, you may need to reduce  your sodium intake to 1,500 mg a day.  Work with your health care provider or dietitian to adjust your eating plan to your individual calorie needs. This information is not intended to replace advice given to you by your health care provider. Make sure you discuss any questions you have with your health care provider. Document Revised: 10/19/2019 Document Reviewed: 10/19/2019 Elsevier Patient Education  2021 [place].  Please get you Covid booster COVID-19 Vaccine Information can be found at: ShippingScam.co.uk For questions related to vaccine distribution or appointments, please email [email or web address] or call [phone].

## 2020-12-22 NOTE — Assessment & Plan Note (Signed)
  .   Current smoking consumption amount: Down to 2-4 cigarettes daily  . Dicsussion on advise to quit smoking and smoking impacts: Importance of quitting smoking due to impacts on cardiovascular health and stroke recurrence  . Patient's willingness to quit: Is willing to quit  . Methods to quit smoking discussed: We discussed behavioral modification the use of sertraline to help with her anxiety and depression and nicotine replacement therapy with lozenge 4 mg 3 times daily as needed  . Medication management of smoking session drugs discussed: I have prescribed nicotine replacement therapy  . Resources provided:  AVS   . Setting quit date date not set  . Follow-up arranged follow-up OV 4 months   Time spent counseling the patient: 5 minutes  

## 2020-12-22 NOTE — Assessment & Plan Note (Signed)
Recent CT angio did not show pulmonary emboli but did show dilated pulmonary artery consistent with pulmonary hypertension  Patient denies any dyspnea Possibility of sleep apnea playing a role with BMI of 30.6  Possibility of diastolic dysfunction with hypertension  Cardiology appointment today will follow up from there

## 2020-12-22 NOTE — Assessment & Plan Note (Signed)
Hypertension not under treatment  Will begin valsartan  160 mg daily  Patient given a DASH diet

## 2020-12-22 NOTE — Assessment & Plan Note (Signed)
Shoulder surgery on hold pending cardiology clearance

## 2020-12-26 DIAGNOSIS — E079 Disorder of thyroid, unspecified: Secondary | ICD-10-CM | POA: Insufficient documentation

## 2020-12-26 DIAGNOSIS — I639 Cerebral infarction, unspecified: Secondary | ICD-10-CM | POA: Insufficient documentation

## 2020-12-26 DIAGNOSIS — E039 Hypothyroidism, unspecified: Secondary | ICD-10-CM | POA: Insufficient documentation

## 2020-12-29 ENCOUNTER — Telehealth: Payer: Self-pay | Admitting: Cardiology

## 2020-12-29 NOTE — Telephone Encounter (Signed)
Patient returning call for echo results. 

## 2020-12-29 NOTE — Telephone Encounter (Signed)
Patient informed of results.  

## 2020-12-30 ENCOUNTER — Other Ambulatory Visit: Payer: Self-pay

## 2020-12-30 ENCOUNTER — Encounter: Payer: Self-pay | Admitting: Cardiology

## 2020-12-30 ENCOUNTER — Ambulatory Visit (INDEPENDENT_AMBULATORY_CARE_PROVIDER_SITE_OTHER): Payer: Commercial Managed Care - PPO | Admitting: Cardiology

## 2020-12-30 VITALS — BP 118/70 | HR 68 | Ht 68.0 in | Wt 203.0 lb

## 2020-12-30 DIAGNOSIS — I272 Pulmonary hypertension, unspecified: Secondary | ICD-10-CM

## 2020-12-30 DIAGNOSIS — Z720 Tobacco use: Secondary | ICD-10-CM | POA: Diagnosis not present

## 2020-12-30 DIAGNOSIS — M7541 Impingement syndrome of right shoulder: Secondary | ICD-10-CM | POA: Diagnosis not present

## 2020-12-30 DIAGNOSIS — I729 Aneurysm of unspecified site: Secondary | ICD-10-CM

## 2020-12-30 DIAGNOSIS — Z8673 Personal history of transient ischemic attack (TIA), and cerebral infarction without residual deficits: Secondary | ICD-10-CM

## 2020-12-30 DIAGNOSIS — E782 Mixed hyperlipidemia: Secondary | ICD-10-CM

## 2020-12-30 NOTE — Progress Notes (Signed)
Cardiology Office Note:    Date:  12/30/2020   ID:  Meagan Flores, DOB March 23, 1966, MRN [id number]  PCP:  Elsie Stain, MD  Cardiologist:  Jenne Campus, MD    Referring MD: Elsie Stain, MD   Chief Complaint  Patient presents with  . Follow-up  M doing i fine but have a lot of pain in my shoulders  History of Present Illness:    Meagan Flores is a 55 y.o. female who was referred to [place] because she was getting ready to have a rotator cuff surgery done a CT of her chest being done which find enlargement of the aorta measuring 38 mm as well as enlargement of the pulmonary artery measuring 44 mm.  Suspicion for pulmonary hypertension has been raised and she was sent to [place] for evaluation for this issue.  Overall she is doing quite well she still works physically she has no difficulty doing it instead of walking around she does have some problems left ankle she did recently remove the boot that she wear for it she said she is able to walk in [place] around and have no difficulty doing it.  Shortness of breath is there but only mild does not bother her too much.  Said that she still continues to smoke but cut down significantly amount of cigarettes that she is smoking.  Denies have any chest pain tightness squeezing pressure burning chest no dizziness no passing out.  No swelling of lower extremities no proximal nocturnal dyspnea  Past Medical History:  Diagnosis Date  . Aneurysm ([place]) 03/18/2020  . 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 COVID-19 virus infection 02/14/2020  . 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) tear of right shoulder 09/30/2020  . Essential hypertension   . Glenohumeral arthritis 12/27/2014  . History of stroke   . History of TIA (transient ischemic attack) 02/14/2020  . Hyperlipidemia   . Hypothyroidism   . Impingement syndrome of right shoulder 12/24/2016  . Mixed hyperlipidemia   . 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  Prediabetes   . Preop cardiovascular exam 11/25/2020  . Pulmonary hypertension, unspecified ([place]) 11/25/2020  . 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 Rotator cuff impingement syndrome of right shoulder 09/30/2020  . Stroke ([place])   . Tendinopathy of right biceps tendon 09/30/2020  . Thyroid disease   . TIA (transient ischemic attack) 04/15/2018  . Tobacco use 09/28/2016    Past Surgical History:  Procedure Laterality Date  . PARTIAL HYSTERECTOMY    . right arm sx    . SHOULDER ARTHROSCOPY WITH DISTAL CLAVICLE RESECTION Right 01/12/2017   Procedure: RIGHT SHOULDER ARTHROSCOPY WITH  SUBACROMIAL DECOMPRESSION, DISTAL CLAVICLE EXCISION, extensive debridement;  Surgeon: Leandrew Koyanagi, MD;  Location: [facility];  Service: Orthopedics;  Laterality: Right;  . THYMECTOMY  2004  . THYROIDECTOMY      Current Medications: Current Meds  Medication Sig  . acetaminophen (TYLENOL) 325 MG tablet Take 2 tablets (650 mg total) by mouth every 6 (six) hours as needed for up to 30 doses for mild pain or moderate pain.  Marland Kitchen aspirin EC 81 MG tablet Take 1 tablet (81 mg total) by mouth daily.  Marland Kitchen atorvastatin (LIPITOR) 40 MG tablet Take 1 tablet (40 mg total) by mouth daily at 6 PM.  . ibuprofen (ADVIL) 600 MG tablet Take 1 tablet (600 mg total) by mouth every 6 (six) hours as needed for up to 30 doses for mild pain or moderate pain.  Marland Kitchen  levothyroxine (SYNTHROID) 50 MCG tablet Take 2 tablets (100 mcg total) by mouth daily.  . valsartan (DIOVAN) 160 MG tablet Take 1 tablet (160 mg total) by mouth daily.     Allergies:   Sulfa antibiotics   Social History   Socioeconomic History  . Marital status: Married    Spouse name: Not on file  . Number of children: Not on file  . Years of education: Not on file  . Highest education level: Not on file  Occupational History  . Not on file  Tobacco Use  . Smoking status: Current Every Day Smoker    Packs/day: 0.25    Years: 34.00    Pack years: 8.50  .  Smokeless tobacco: Never Used  Vaping Use  . Vaping Use: Never used  Substance and Sexual Activity  . Alcohol use: Yes    Comment: occassional  . Drug use: No  . Sexual activity: Not on file  Other Topics Concern  . Not on file  Social History Narrative  . Not on file   Social Determinants of Health   Financial Resource Strain: Not on file  Food Insecurity: Not on file  Transportation Needs: Not on file  Physical Activity: Not on file  Stress: Not on file  Social Connections: Not on file     Family History: The patient's family history includes Colon polyps in her sister; Stomach cancer in her sister. There is no history of Esophageal cancer, Rectal cancer, Colon cancer, or Breast cancer. ROS:   Please see the history of present illness.    All 14 point review of systems negative except as described per history of present illness  EKGs/Labs/Other Studies Reviewed:      Recent Labs: 10/21/2020: TSH 3.300 11/04/2020: ALT 13; BUN 15; Creatinine, Ser 0.78; Hemoglobin 11.7; Platelets 233; Potassium 3.5; Sodium 140  Recent Lipid Panel    Component Value Date/Time   CHOL 203 (H) 04/15/2018 0058   CHOL 214 (H) 08/05/2017 1645   TRIG 140 04/15/2018 0058   HDL 45 04/15/2018 0058   HDL 51 08/05/2017 1645   CHOLHDL 4.5 04/15/2018 0058   VLDL 28 04/15/2018 0058   LDLCALC 130 (H) 04/15/2018 0058   LDLCALC 145 (H) 08/05/2017 1645    Physical Exam:    VS:  BP 118/70 (BP Location: Left Arm, Patient Position: Sitting)   Pulse 68   Ht 5\' 8"  ([id number] m)   Wt 203 lb (92.1 kg)   SpO2 99%   BMI 30.87 kg/m     Wt Readings from Last 3 Encounters:  12/30/20 203 lb (92.1 kg)  12/22/20 201 lb 6.4 oz (91.4 kg)  11/25/20 197 lb 12.8 oz (89.7 kg)     GEN:  Well nourished, well developed in no acute distress HEENT: Normal NECK: No JVD; No carotid bruits LYMPHATICS: No lymphadenopathy CARDIAC: RRR, no murmurs, no rubs, no gallops RESPIRATORY:  Clear to auscultation without rales,  wheezing or rhonchi  ABDOMEN: Soft, non-tender, non-distended MUSCULOSKELETAL:  No edema; No deformity  SKIN: Warm and dry LOWER EXTREMITIES: no swelling NEUROLOGIC:  Alert and oriented x 3 PSYCHIATRIC:  Normal affect   ASSESSMENT:    1. Pulmonary hypertension, unspecified ([place])   2. Rotator cuff impingement syndrome of right shoulder   3. Tobacco use   4. History of stroke   5. Mixed hyperlipidemia   6. Aneurysm ([place])    PLAN:    In order of problems listed above:  1. Pulmonary hypertension.  Echocardiogram did not  show any elevation of the pulmonary pressure.  Echocardiogram also did not show any enlargement of the right ventricle nor increase in thickening of the wall of the right ventricle.  I doubt very much in presence of significant pulmonary hypertension.  On top of that her symptomatology does not suggest significant pulmonary hypertension.  She does have some shortness of breath but that is unchanged for the last few years.  We did talk in length about this condition and I told her that we need to repeat echocardiogram probably in about 6 months to 1 year to verify that but she needs to let me know if she got any shortness of breath or swelling of lower extremities.  She does not have any evidence of clinical examination of right side failure no hepatomegaly noted, no swelling of lower extremities no hepatomegaly. 2. Rotary cuff surgery is being planned.  From my standpoint of view we should be able to proceed with no reservations. 3. Multiple risk factors for coronary artery disease, luckily, she does not have any signs and symptoms of active coronary artery disease.  She can walk climb stairs she walks physically she has no difficulty doing it. 4. The key is risk factors modifications.  Her blood pressure is well controlled.  I will schedule her to have fasting lipid profile done to see if there is any role for increasing dose of Lipitor that she is already taking.  In the  meantime we will continue antiplatelet therapy as well as statin. 5. Smoking obviously a problem spent of this 5 minutes and I strongly recommended to quit. 6. 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  Will require periodic checks.  No need to intervene right now except as described above   Medication Adjustments/Labs and Tests Ordered: Current medicines are reviewed at length with the patient today.  Concerns regarding medicines are outlined above.  No orders of the defined types were placed in this encounter.  Medication changes: No orders of the defined types were placed in this encounter.   Signed, Park Liter, MD, [facility] 12/30/2020 1:20 PM    [place]

## 2020-12-30 NOTE — Patient Instructions (Signed)

## 2021-01-06 ENCOUNTER — Telehealth: Payer: Self-pay | Admitting: Orthopaedic Surgery

## 2021-01-06 NOTE — Telephone Encounter (Signed)
[employer] forms received. Sent to [employer]. 

## 2021-01-10 ENCOUNTER — Other Ambulatory Visit: Payer: Self-pay | Admitting: Critical Care Medicine

## 2021-01-14 ENCOUNTER — Other Ambulatory Visit: Payer: Self-pay

## 2021-01-14 ENCOUNTER — Encounter (HOSPITAL_BASED_OUTPATIENT_CLINIC_OR_DEPARTMENT_OTHER): Payer: Self-pay | Admitting: Orthopaedic Surgery

## 2021-01-15 ENCOUNTER — Telehealth: Payer: Self-pay | Admitting: Orthopaedic Surgery

## 2021-01-15 NOTE — Telephone Encounter (Signed)
Received $25.00 cash and medical records release form from patient /   Forwarding to [employer] today 

## 2021-01-19 ENCOUNTER — Other Ambulatory Visit (HOSPITAL_COMMUNITY)
Admission: RE | Admit: 2021-01-19 | Discharge: 2021-01-19 | Disposition: A | Discharge status: Home / Self Care | Payer: Commercial Managed Care - PPO | Source: Ambulatory Visit | Attending: Orthopaedic Surgery | Admitting: Orthopaedic Surgery

## 2021-01-19 DIAGNOSIS — Z01812 Encounter for preprocedural laboratory examination: Secondary | ICD-10-CM | POA: Diagnosis not present

## 2021-01-19 DIAGNOSIS — Z20822 Contact with and (suspected) exposure to covid-19: Secondary | ICD-10-CM | POA: Diagnosis not present

## 2021-01-19 LAB — SARS CORONAVIRUS 2 (TAT 6-24 HRS): SARS Coronavirus 2: NEGATIVE

## 2021-01-21 ENCOUNTER — Encounter: Payer: Self-pay | Admitting: Orthopaedic Surgery

## 2021-01-21 ENCOUNTER — Ambulatory Visit (HOSPITAL_BASED_OUTPATIENT_CLINIC_OR_DEPARTMENT_OTHER): Payer: Commercial Managed Care - PPO | Admitting: Anesthesiology

## 2021-01-21 ENCOUNTER — Other Ambulatory Visit: Payer: Self-pay

## 2021-01-21 ENCOUNTER — Ambulatory Visit (HOSPITAL_BASED_OUTPATIENT_CLINIC_OR_DEPARTMENT_OTHER)
Admission: RE | Admit: 2021-01-21 | Discharge: 2021-01-21 | Disposition: A | Discharge status: Home / Self Care | Payer: Commercial Managed Care - PPO | Attending: Orthopaedic Surgery | Admitting: Orthopaedic Surgery

## 2021-01-21 ENCOUNTER — Encounter (HOSPITAL_BASED_OUTPATIENT_CLINIC_OR_DEPARTMENT_OTHER): Payer: Self-pay | Admitting: Orthopaedic Surgery

## 2021-01-21 ENCOUNTER — Encounter (HOSPITAL_BASED_OUTPATIENT_CLINIC_OR_DEPARTMENT_OTHER)
Admission: RE | Disposition: A | Discharge status: Home / Self Care | Payer: Self-pay | Source: Home / Self Care | Attending: Orthopaedic Surgery

## 2021-01-21 DIAGNOSIS — F17210 Nicotine dependence, cigarettes, uncomplicated: Secondary | ICD-10-CM | POA: Insufficient documentation

## 2021-01-21 DIAGNOSIS — Z7982 Long term (current) use of aspirin: Secondary | ICD-10-CM | POA: Diagnosis not present

## 2021-01-21 DIAGNOSIS — I10 Essential (primary) hypertension: Secondary | ICD-10-CM | POA: Insufficient documentation

## 2021-01-21 DIAGNOSIS — R7303 Prediabetes: Secondary | ICD-10-CM | POA: Insufficient documentation

## 2021-01-21 DIAGNOSIS — Z8616 Personal history of COVID-19: Secondary | ICD-10-CM | POA: Diagnosis not present

## 2021-01-21 DIAGNOSIS — Z8673 Personal history of transient ischemic attack (TIA), and cerebral infarction without residual deficits: Secondary | ICD-10-CM | POA: Diagnosis not present

## 2021-01-21 DIAGNOSIS — I272 Pulmonary hypertension, unspecified: Secondary | ICD-10-CM | POA: Insufficient documentation

## 2021-01-21 DIAGNOSIS — Z882 Allergy status to sulfonamides status: Secondary | ICD-10-CM | POA: Insufficient documentation

## 2021-01-21 DIAGNOSIS — Z90711 Acquired absence of uterus with remaining cervical stump: Secondary | ICD-10-CM | POA: Insufficient documentation

## 2021-01-21 DIAGNOSIS — S43431A Superior glenoid labrum lesion of right shoulder, initial encounter: Secondary | ICD-10-CM

## 2021-01-21 DIAGNOSIS — E782 Mixed hyperlipidemia: Secondary | ICD-10-CM | POA: Diagnosis not present

## 2021-01-21 DIAGNOSIS — E890 Postprocedural hypothyroidism: Secondary | ICD-10-CM | POA: Insufficient documentation

## 2021-01-21 DIAGNOSIS — G8929 Other chronic pain: Secondary | ICD-10-CM | POA: Diagnosis not present

## 2021-01-21 DIAGNOSIS — Z79899 Other long term (current) drug therapy: Secondary | ICD-10-CM | POA: Insufficient documentation

## 2021-01-21 DIAGNOSIS — M7541 Impingement syndrome of right shoulder: Secondary | ICD-10-CM

## 2021-01-21 DIAGNOSIS — M19011 Primary osteoarthritis, right shoulder: Secondary | ICD-10-CM | POA: Insufficient documentation

## 2021-01-21 HISTORY — PX: SHOULDER ARTHROSCOPY WITH BICEPSTENOTOMY: SHX6204

## 2021-01-21 SURGERY — SHOULDER ARTHROSCOPY WITH SUBACROMIAL DECOMPRESSION AND DISTAL CLAVICLE EXCISION
Anesthesia: General | Site: Shoulder | Laterality: Right

## 2021-01-21 MED ORDER — EPHEDRINE 5 MG/ML INJ
INTRAVENOUS | Status: AC
  Filled 2021-01-21: qty 10

## 2021-01-21 MED ORDER — MEPERIDINE HCL 25 MG/ML IJ SOLN: 6.2500 mg | INTRAMUSCULAR | Status: DC | PRN

## 2021-01-21 MED ORDER — PROPOFOL 10 MG/ML IV BOLUS
INTRAVENOUS | Status: AC
  Filled 2021-01-21: qty 40

## 2021-01-21 MED ORDER — ONDANSETRON HCL 4 MG PO TABS: 4.0000 mg | ORAL_TABLET | Freq: Three times a day (TID) | ORAL | 0 refills | Status: DC | PRN

## 2021-01-21 MED ORDER — CEFAZOLIN SODIUM-DEXTROSE 2-4 GM/100ML-% IV SOLN
2.0000 g | INTRAVENOUS | Status: AC
  Administered 2021-01-21: 2 g via INTRAVENOUS

## 2021-01-21 MED ORDER — FENTANYL CITRATE (PF) 100 MCG/2ML IJ SOLN
100.0000 ug | Freq: Once | INTRAMUSCULAR | Status: AC
Start: 2021-01-21 — End: 2021-01-21
  Administered 2021-01-21: 100 ug via INTRAVENOUS

## 2021-01-21 MED ORDER — EPHEDRINE SULFATE-NACL 50-0.9 MG/10ML-% IV SOSY
PREFILLED_SYRINGE | INTRAVENOUS | Status: DC | PRN
  Administered 2021-01-21 (×4): 10 mg via INTRAVENOUS

## 2021-01-21 MED ORDER — PROMETHAZINE HCL 25 MG/ML IJ SOLN: 6.2500 mg | INTRAMUSCULAR | Status: DC | PRN

## 2021-01-21 MED ORDER — LACTATED RINGERS IV SOLN: INTRAVENOUS | Status: DC

## 2021-01-21 MED ORDER — AMISULPRIDE (ANTIEMETIC) 5 MG/2ML IV SOLN: 10.0000 mg | Freq: Once | INTRAVENOUS | Status: DC | PRN

## 2021-01-21 MED ORDER — MIDAZOLAM HCL 2 MG/2ML IJ SOLN
2.0000 mg | Freq: Once | INTRAMUSCULAR | Status: AC
  Administered 2021-01-21: 2 mg via INTRAVENOUS

## 2021-01-21 MED ORDER — HYDROMORPHONE HCL 1 MG/ML IJ SOLN: 0.2500 mg | INTRAMUSCULAR | Status: DC | PRN

## 2021-01-21 MED ORDER — OXYCODONE HCL 5 MG/5ML PO SOLN: 5.0000 mg | Freq: Once | ORAL | Status: DC | PRN

## 2021-01-21 MED ORDER — BUPIVACAINE LIPOSOME 1.3 % IJ SUSP
INTRAMUSCULAR | Status: DC | PRN
  Administered 2021-01-21: 10 mL via PERINEURAL

## 2021-01-21 MED ORDER — ESMOLOL HCL 100 MG/10ML IV SOLN
INTRAVENOUS | Status: DC | PRN
  Administered 2021-01-21 (×2): 10 mg via INTRAVENOUS

## 2021-01-21 MED ORDER — CEFAZOLIN SODIUM-DEXTROSE 2-4 GM/100ML-% IV SOLN
INTRAVENOUS | Status: AC
  Filled 2021-01-21: qty 100

## 2021-01-21 MED ORDER — SODIUM CHLORIDE 0.9 % IR SOLN
Status: DC | PRN
  Administered 2021-01-21: 6000 mL

## 2021-01-21 MED ORDER — FENTANYL CITRATE (PF) 100 MCG/2ML IJ SOLN
INTRAMUSCULAR | Status: AC
  Filled 2021-01-21: qty 2

## 2021-01-21 MED ORDER — LIDOCAINE 2% (20 MG/ML) 5 ML SYRINGE
INTRAMUSCULAR | Status: DC | PRN
  Administered 2021-01-21: 40 mg via INTRAVENOUS

## 2021-01-21 MED ORDER — ONDANSETRON HCL 4 MG/2ML IJ SOLN
INTRAMUSCULAR | Status: DC | PRN
  Administered 2021-01-21: 4 mg via INTRAVENOUS

## 2021-01-21 MED ORDER — OXYCODONE HCL 5 MG PO TABS: 5.0000 mg | ORAL_TABLET | Freq: Once | ORAL | Status: DC | PRN

## 2021-01-21 MED ORDER — GLYCOPYRROLATE 0.2 MG/ML IJ SOLN
INTRAMUSCULAR | Status: DC | PRN
  Administered 2021-01-21: .1 mg via INTRAVENOUS

## 2021-01-21 MED ORDER — GLYCOPYRROLATE PF 0.2 MG/ML IJ SOSY
PREFILLED_SYRINGE | INTRAMUSCULAR | Status: AC
  Filled 2021-01-21: qty 1

## 2021-01-21 MED ORDER — PROPOFOL 10 MG/ML IV BOLUS
INTRAVENOUS | Status: DC | PRN
  Administered 2021-01-21: 200 mg via INTRAVENOUS

## 2021-01-21 MED ORDER — BUPIVACAINE-EPINEPHRINE (PF) 0.25% -1:200000 IJ SOLN
INTRAMUSCULAR | Status: AC
  Filled 2021-01-21: qty 30

## 2021-01-21 MED ORDER — MIDAZOLAM HCL 2 MG/2ML IJ SOLN
INTRAMUSCULAR | Status: AC
  Filled 2021-01-21: qty 2

## 2021-01-21 MED ORDER — DEXAMETHASONE SODIUM PHOSPHATE 10 MG/ML IJ SOLN
INTRAMUSCULAR | Status: DC | PRN
  Administered 2021-01-21: 5 mg via INTRAVENOUS

## 2021-01-21 MED ORDER — BUPIVACAINE-EPINEPHRINE (PF) 0.25% -1:200000 IJ SOLN
INTRAMUSCULAR | Status: DC | PRN
  Administered 2021-01-21: 20 mL

## 2021-01-21 MED ORDER — OXYCODONE-ACETAMINOPHEN 5-325 MG PO TABS: 1.0000 | ORAL_TABLET | Freq: Three times a day (TID) | ORAL | 0 refills | Status: DC | PRN

## 2021-01-21 MED ORDER — BUPIVACAINE HCL (PF) 0.5 % IJ SOLN
INTRAMUSCULAR | Status: DC | PRN
  Administered 2021-01-21: 20 mL via PERINEURAL

## 2021-01-21 MED ORDER — LIDOCAINE 2% (20 MG/ML) 5 ML SYRINGE
INTRAMUSCULAR | Status: AC
  Filled 2021-01-21: qty 5

## 2021-01-21 SURGICAL SUPPLY — 73 items
ADH SKN CLS APL DERMABOND .7 (GAUZE/BANDAGES/DRESSINGS)
APL PRP STRL LF DISP 70% ISPRP (MISCELLANEOUS) ×1
APL SKNCLS STERI-STRIP NONHPOA (GAUZE/BANDAGES/DRESSINGS)
BENZOIN TINCTURE PRP APPL 2/3 (GAUZE/BANDAGES/DRESSINGS) IMPLANT
BLADE EXCALIBUR 4.0X13 (MISCELLANEOUS) ×2 IMPLANT
BLADE SURG 15 STRL LF DISP TIS (BLADE) IMPLANT
BLADE SURG 15 STRL SS (BLADE)
BNDG COHESIVE 4X5 TAN STRL (GAUZE/BANDAGES/DRESSINGS) ×2 IMPLANT
BURR OVAL 8 FLU 4.0X13 (MISCELLANEOUS) ×2 IMPLANT
CANNULA 5.75X71 LONG (CANNULA) ×2 IMPLANT
CANNULA SHOULDER 7CM (CANNULA) ×2 IMPLANT
CANNULA TWIST IN 8.25X7CM (CANNULA) IMPLANT
CHLORAPREP W/TINT 26 (MISCELLANEOUS) ×2 IMPLANT
COOLER ICEMAN CLASSIC (MISCELLANEOUS) ×2 IMPLANT
COVER WAND RF STERILE (DRAPES) IMPLANT
DECANTER SPIKE VIAL GLASS SM (MISCELLANEOUS) ×2 IMPLANT
DERMABOND ADVANCED (GAUZE/BANDAGES/DRESSINGS)
DERMABOND ADVANCED .7 DNX12 (GAUZE/BANDAGES/DRESSINGS) IMPLANT
DISSECTOR  3.8MM X 13CM (MISCELLANEOUS)
DISSECTOR 3.8MM X 13CM (MISCELLANEOUS) IMPLANT
DRAPE IMP U-DRAPE 54X76 (DRAPES) ×2 IMPLANT
DRAPE INCISE IOBAN 66X45 STRL (DRAPES) ×2 IMPLANT
DRAPE STERI 35X30 U-POUCH (DRAPES) ×2 IMPLANT
DRAPE SURG 17X23 STRL (DRAPES) ×2 IMPLANT
DRAPE U-SHAPE 47X51 STRL (DRAPES) ×2 IMPLANT
DRAPE U-SHAPE 76X120 STRL (DRAPES) ×4 IMPLANT
DRSG PAD ABDOMINAL 8X10 ST (GAUZE/BANDAGES/DRESSINGS) ×4 IMPLANT
DURAPREP 26ML APPLICATOR (WOUND CARE) ×2 IMPLANT
ELECT REM PT RETURN 9FT ADLT (ELECTROSURGICAL)
ELECTRODE REM PT RTRN 9FT ADLT (ELECTROSURGICAL) IMPLANT
FIBER TAPE 2MM (SUTURE) IMPLANT
GAUZE SPONGE 4X4 12PLY STRL (GAUZE/BANDAGES/DRESSINGS) ×2 IMPLANT
GAUZE XEROFORM 1X8 LF (GAUZE/BANDAGES/DRESSINGS) ×2 IMPLANT
GLOVE SURG LTX SZ6.5 (GLOVE) ×4 IMPLANT
GLOVE SURG LTX SZ7 (GLOVE) ×2 IMPLANT
GLOVE SURG NEOP MICRO LF SZ7.5 (GLOVE) ×2 IMPLANT
GLOVE SURG SYN 7.5  E (GLOVE) ×4
GLOVE SURG SYN 7.5 E (GLOVE) ×2 IMPLANT
GLOVE SURG UNDER POLY LF SZ7 (GLOVE) ×4 IMPLANT
GOWN STRL REIN XL XLG (GOWN DISPOSABLE) ×4 IMPLANT
GOWN STRL REUS W/ TWL LRG LVL3 (GOWN DISPOSABLE) ×1 IMPLANT
GOWN STRL REUS W/ TWL XL LVL3 (GOWN DISPOSABLE) IMPLANT
GOWN STRL REUS W/TWL LRG LVL3 (GOWN DISPOSABLE) ×2
GOWN STRL REUS W/TWL XL LVL3 (GOWN DISPOSABLE)
MANIFOLD NEPTUNE II (INSTRUMENTS) ×2 IMPLANT
NEEDLE SCORPION MULTI FIRE (NEEDLE) IMPLANT
PACK ARTHROSCOPY DSU (CUSTOM PROCEDURE TRAY) ×2 IMPLANT
PACK BASIN DAY SURGERY FS (CUSTOM PROCEDURE TRAY) ×2 IMPLANT
PAD COLD SHLDR UNI WRAP-ON (PAD) ×2
PAD COLD SHLDR WRAP-ON (PAD) IMPLANT
PAD COLD UNI WRAP-ON (PAD) ×1 IMPLANT
PORT APPOLLO RF 90DEGREE MULTI (SURGICAL WAND) ×2 IMPLANT
SHEET MEDIUM DRAPE 40X70 STRL (DRAPES) ×2 IMPLANT
SLEEVE SCD COMPRESS KNEE MED (MISCELLANEOUS) ×2 IMPLANT
SLING ARM FOAM STRAP LRG (SOFTGOODS) ×2 IMPLANT
STRIP CLOSURE SKIN 1/2X4 (GAUZE/BANDAGES/DRESSINGS) IMPLANT
SUT ETHILON 3 0 PS 1 (SUTURE) ×2 IMPLANT
SUT FIBERWIRE #2 38 T-5 BLUE (SUTURE)
SUT MNCRL AB 4-0 PS2 18 (SUTURE) ×2 IMPLANT
SUT PDS AB 1 CT  36 (SUTURE)
SUT PDS AB 1 CT 36 (SUTURE) IMPLANT
SUT TIGER TAPE 7 IN WHITE (SUTURE) IMPLANT
SUT VIC AB 2-0 CT1 27 (SUTURE) ×2
SUT VIC AB 2-0 CT1 TAPERPNT 27 (SUTURE) ×1 IMPLANT
SUTURE FIBERWR #2 38 T-5 BLUE (SUTURE) IMPLANT
SUTURE TAPE 1.3 40 TPR END (SUTURE) IMPLANT
SUTURE TAPE TIGERLINK 1.3MM BL (SUTURE) IMPLANT
SUTURETAPE 1.3 40 TPR END (SUTURE)
SUTURETAPE TIGERLINK 1.3MM BL (SUTURE)
SYR 50ML LL SCALE MARK (SYRINGE) IMPLANT
TOWEL GREEN STERILE FF (TOWEL DISPOSABLE) ×2 IMPLANT
TUBE CONNECTING 20X1/4 (TUBING) ×2 IMPLANT
TUBING ARTHROSCOPY IRRIG 16FT (MISCELLANEOUS) ×2 IMPLANT

## 2021-01-21 NOTE — Anesthesia Procedure Notes (Signed)
Anesthesia Regional Block: Interscalene brachial plexus block   Pre-Anesthetic Checklist: ,, timeout performed, Correct Patient, Correct Site, Correct Laterality, Correct Procedure, Correct Position, site marked, Risks and benefits discussed,  Surgical consent,  Pre-op evaluation,  At surgeon's request and post-op pain management  Laterality: Right  Prep: chloraprep       Needles:  Injection technique: Single-shot  Needle Type: Stimiplex     Needle Length: 9cm  Needle Gauge: 21     Additional Needles:   Procedures:,,,, ultrasound used (permanent image in chart),,,,  Narrative:  Start time: 01/21/2021 11:23 AM End time: 01/21/2021 11:28 AM Injection made incrementally with aspirations every 5 mL.  Performed by: Personally  Anesthesiologist: Lynda Rainwater, MD

## 2021-01-21 NOTE — Discharge Instructions (Signed)
Post-operative patient instructions  Shoulder Arthroscopy   . Ice:  Place intermittent ice or cooler pack over your shoulder, 30 minutes on and 30 minutes off.  Continue this for the first 72 hours after surgery, then save ice for use after therapy sessions or on more active days.   . Weight:  You may bear weight on your arm as your symptoms allow. . Motion:  Perform gentle shoulder motion as tolerated . Dressing:  Perform 1st dressing change at 2 days postoperative. A moderate amount of blood tinged drainage is to be expected.  So if you bleed through the dressing on the first or second day or if you have fevers, it is fine to change the dressing/check the wounds early and redress wound.  If it bleeds through again, or if the incisions are leaking frank blood, please call the office. May change dressing every 1-2 days thereafter to help watch wounds. Can purchase Tegaderm (or 68M Nexcare) water resistant dressings at local pharmacy / [facility]. . Shower:  Light shower is ok after 2 days.  Please take shower, NO bath. Recover with gauze and ace wrap to help keep wounds protected.   . Pain medication:  A narcotic pain medication has been prescribed.  Take as directed.  Typically you need narcotic pain medication more regularly during the first 3 to 5 days after surgery.  Decrease your use of the medication as the pain improves.  Narcotics can sometimes cause constipation, even after a few doses.  If you have problems with constipation, you can take an over the counter stool softener or light laxative.  If you have persistent problems, please notify your physician's office. Marland Kitchen Physical therapy: Additional activity guidelines to be provided by your physician or physical therapist at follow-up visits.  . Driving: Do not recommend driving x 2 weeks post surgical, especially if surgery performed on right side. Should not drive while taking narcotic pain medications. It typically takes at least 2 weeks to  restore sufficient neuromuscular function for normal reaction times for driving safety.  . Call [phone] for questions or problems. Evenings you will be forwarded to the hospital operator.  Ask for the orthopaedic physician on call. Please call if you experience:    o Redness, foul smelling, or persistent drainage from the surgical site  o worsening shoulder pain and swelling not responsive to medication  o any calf pain and or swelling of the lower leg  o temperatures greater than 101.5 F o other questions or concerns   Thank you for allowing [place] to be a part of your care.    Post Anesthesia Home Care Instructions  Activity: Get plenty of rest for the remainder of the day. A responsible individual must stay with you for 24 hours following the procedure.  For the next 24 hours, DO NOT: -Drive a car -Paediatric nurse -Drink alcoholic beverages -Take any medication unless instructed by your physician -Make any legal decisions or sign important papers.  Meals: Start with liquid foods such as gelatin or soup. Progress to regular foods as tolerated. Avoid greasy, spicy, heavy foods. If nausea and/or vomiting occur, drink only clear liquids until the nausea and/or vomiting subsides. Call your physician if vomiting continues.  Special Instructions/Symptoms: Your throat may feel dry or sore from the anesthesia or the breathing tube placed in your throat during surgery. If this causes discomfort, gargle with warm salt water. The discomfort should disappear within 24 hours.  If you had a scopolamine patch  placed behind your ear for the management of post- operative nausea and/or vomiting:  1. The medication in the patch is effective for 72 hours, after which it should be removed.  Wrap patch in a tissue and discard in the trash. Wash hands thoroughly with soap and water. 2. You may remove the patch earlier than 72 hours if you experience unpleasant side effects which may include dry  mouth, dizziness or visual disturbances. 3. Avoid touching the patch. Wash your hands with soap and water after contact with the patch.    Regional Anesthesia Blocks  1. Numbness or the inability to move the "blocked" extremity may last from 3-48 hours after placement. The length of time depends on the medication injected and your individual response to the medication. If the numbness is not going away after 48 hours, call your surgeon.  2. The extremity that is blocked will need to be protected until the numbness is gone and the  Strength has returned. Because you cannot feel it, you will need to take extra care to avoid injury. Because it may be weak, you may have difficulty moving it or using it. You may not know what position it is in without looking at it while the block is in effect.  3. For blocks in the legs and feet, returning to weight bearing and walking needs to be done carefully. You will need to wait until the numbness is entirely gone and the strength has returned. You should be able to move your leg and foot normally before you try and bear weight or walk. You will need someone to be with you when you first try to ensure you do not fall and possibly risk injury.  4. Bruising and tenderness at the needle site are common side effects and will resolve in a few days.  5. Persistent numbness or new problems with movement should be communicated to the surgeon or the [place] [phone] [place] [phone]).Information for Discharge Teaching: EXPAREL (bupivacaine liposome injectable suspension)   Your surgeon or anesthesiologist gave you EXPAREL(bupivacaine) to help control your pain after surgery.   EXPAREL is a local anesthetic that provides pain relief by numbing the tissue around the surgical site.  EXPAREL is designed to release pain medication over time and can control pain for up to 72 hours.  Depending on how you respond to EXPAREL, you  may require less pain medication during your recovery.  Possible side effects:  Temporary loss of sensation or ability to move in the area where bupivacaine was injected.  Nausea, vomiting, constipation  Rarely, numbness and tingling in your mouth or lips, lightheadedness, or anxiety may occur.  Call your doctor right away if you think you may be experiencing any of these sensations, or if you have other questions regarding possible side effects.  Follow all other discharge instructions given to you by your surgeon or nurse. Eat a healthy diet and drink plenty of water or other fluids.  If you return to the hospital for any reason within 96 hours following the administration of EXPAREL, it is important for health care providers to know that you have received this anesthetic. A teal colored band has been placed on your arm with the date, time and amount of EXPAREL you have received in order to alert and inform your health care providers. Please leave this armband in place for the full 96 hours following administration, and then you may remove the band.

## 2021-01-21 NOTE — Anesthesia Postprocedure Evaluation (Signed)
Anesthesia Post Note  Patient: [name]  Procedure(s) Performed: RIGHT SHOULDER ARTHROSCOPY WITH EXTENSIVE DEBRIDEMENT, SUBACROMIAL DECOMPRESSION, DISTAL CLAVICLE EXCISION, AND BICEPS TENOTOMY (Right Shoulder) SHOULDER ARTHROSCOPY WITH BICEPSTENOTOMY (Right Shoulder)     Patient location during evaluation: PACU Anesthesia Type: General Level of consciousness: awake and alert Pain management: pain level controlled Vital Signs Assessment: post-procedure vital signs reviewed and stable Respiratory status: spontaneous breathing, nonlabored ventilation and respiratory function stable Cardiovascular status: blood pressure returned to baseline and stable Postop Assessment: no apparent nausea or vomiting Anesthetic complications: no   No complications documented.  Last Vitals:  Vitals:   01/21/21 1345 01/21/21 1416  BP: 117/79 122/78  Pulse: 89 68  Resp: 17 18  Temp:  (!) 36.2 C  SpO2: 98% 95%    Last Pain:  Vitals:   01/21/21 1401  TempSrc:   PainSc: 0-No pain                 Lynda Rainwater

## 2021-01-21 NOTE — Anesthesia Preprocedure Evaluation (Signed)
Anesthesia Evaluation  Patient identified by MRN, date of birth, ID band Patient awake    Reviewed: Allergy & Precautions, H&P , NPO status , Patient's Chart, lab work & pertinent test results  Airway Mallampati: III       Dental  (+) Teeth Intact, Dental Advisory Given   Pulmonary neg pulmonary ROS, Current Smoker and Patient abstained from smoking.,    breath sounds clear to auscultation       Cardiovascular hypertension, Pt. on medications negative cardio ROS   Rhythm:Regular Rate:Normal     Neuro/Psych Depression TIAnegative psych ROS   GI/Hepatic negative GI ROS, Neg liver ROS,   Endo/Other  negative endocrine ROSHypothyroidism   Renal/GU negative Renal ROS  negative genitourinary   Musculoskeletal negative musculoskeletal ROS (+) Arthritis , Osteoarthritis,    Abdominal (+) + obese,   Peds negative pediatric ROS (+)  Hematology negative hematology ROS (+)   Anesthesia Other Findings Day of surgery medications reviewed with the patient.  Reproductive/Obstetrics negative OB ROS                             Anesthesia Physical  Anesthesia Plan  ASA: III  Anesthesia Plan: General   Post-op Pain Management: GA combined w/ Regional for post-op pain   Induction: Intravenous  PONV Risk Score and Plan: 2 and Ondansetron, Midazolam and Treatment may vary due to age or medical condition  Airway Management Planned: LMA  Additional Equipment:   Intra-op Plan:   Post-operative Plan: Extubation in OR  Informed Consent: I have reviewed the patients History and Physical, chart, labs and discussed the procedure including the risks, benefits and alternatives for the proposed anesthesia with the patient or authorized representative who has indicated his/her understanding and acceptance.     Dental advisory given  Plan Discussed with: CRNA  Anesthesia Plan Comments: ( )         Anesthesia Quick Evaluation

## 2021-01-21 NOTE — Op Note (Signed)
   Date of Surgery: 01/21/2021  INDICATIONS: The patient is a 55 year old female with chronic right shoulder pain that has failed conservative treatment;  The patient did consent to the procedure after discussion of the risks and benefits.  PREOPERATIVE DIAGNOSIS:  1.  Right degenerative labral tear including superior, posterior, anterior 2.  Mild right proximal biceps tendinopathy 3.  Moderate right supraspinatus, subscapularis, infraspinatus tendinopathy 4.  Right shoulder AC joint arthrosis 5.  Right shoulder impingement syndrome  POSTOPERATIVE DIAGNOSIS: Same.  PROCEDURE:  1.  Arthroscopic right shoulder extensive debridement including labrum, articular and bursal surface of supraspinatus, subscapularis, infraspinatus, biceps tenotomy, subacromial bursitis 2.  Arthroscopic right shoulder distal clavicle excision 3.  Arthroscopic right shoulder subacromial decompression and acromioplasty  SURGEON: N. Eduard Roux, M.D.  ASSIST: Madalyn Rob, PA-C  ANESTHESIA:  general, regional  IV FLUIDS AND URINE: See anesthesia.  ESTIMATED BLOOD LOSS: minimal mL.  IMPLANTS: None  COMPLICATIONS: None.  DESCRIPTION OF PROCEDURE: The patient was brought to the operating room and placed supine on the operating table.  The patient had been signed prior to the procedure and this was documented. The patient had the anesthesia placed by the anesthesiologist.  A time-out was performed to confirm that this was the correct patient, site, side and location. The patient did receive antibiotics prior to the incision and was re-dosed during the procedure as needed at indicated intervals.  The patient was then positioned into the Ronie Fleeger chair position with all bony prominences well padded and neutral C spine. The patient had the operative extremity prepped and draped in the standard surgical fashion.    Incisions were made for standard shoulder arthroscopy.  Diagnostic shoulder arthroscopy was first  performed.  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  There was posterior superior and anterior degenerative labral tears.  There was mild biceps tendinopathy.  The subscapularis tendon demonstrated mild to moderate tendinopathy as well.  All the structures were gently debrided using oscillating shaver back to stable tissue.  Biceps tenotomy was performed using ArthroCare wand and the stump was debrided with an oscillating shaver.  The articular surface of the supraspinatus mainly anteriorly demonstrated mild tendinopathy with some interstitial tearing.  Gentle debridement was performed there.  Arthroscope was then repositioned into the subacromial space.  Subacromial decompression with acromioplasty and subacromial bursitis removed.  The bursal surface of the supraspinatus and infraspinatus demonstrated moderate tendinopathy without any full-thickness tears.  Gentle debridement performed there.  Finally we identified the [facility] Iv joint and the distal clavicle which was severely degenerative.  Distal clavicle was was excised taking approximately additional 77mm.  We took about 2 mm of the medial portion of the acromion as well.  Excess fluid was removed from the shoulder joint.  Incisions were closed with interrupted nylon sutures.  Sterile dressings were applied.  Shoulder sling placed.  Patient tolerated procedure well had no me complications. Tawanna Cooler, my PA, was a medical necessity for the entirety of the surgery including opening, closing, limb positioning, retracting, exposing, and repairing.  POSTOPERATIVE PLAN: Patient will be discharged home.  Sling for comfort.  Weight-bear as tolerated.  Follow-up in about 10 days for suture removal.  Azucena Cecil, MD [facility] [phone] 1:12 PM

## 2021-01-21 NOTE — Transfer of Care (Signed)
Immediate Anesthesia Transfer of Care Note  Patient: Meagan Flores  Procedure(s) Performed: RIGHT SHOULDER ARTHROSCOPY WITH EXTENSIVE DEBRIDEMENT, SUBACROMIAL DECOMPRESSION, DISTAL CLAVICLE EXCISION, AND BICEPS TENOTOMY (Right Shoulder) SHOULDER ARTHROSCOPY WITH BICEPSTENOTOMY (Right Shoulder)  Patient Location: PACU  Anesthesia Type:General  Level of Consciousness: drowsy and patient cooperative  Airway & Oxygen Therapy: Patient Spontanous Breathing and Patient connected to face mask oxygen  Post-op Assessment: Report given to RN and Post -op Vital signs reviewed and stable  Post vital signs: Reviewed and stable  Last Vitals:  Vitals Value Taken Time  BP 120/81 01/21/21 1325  Temp    Pulse 126 01/21/21 1326  Resp 17 01/21/21 1326  SpO2 100 % 01/21/21 1326  Vitals shown include unvalidated device data.  Last Pain:  Vitals:   01/21/21 1018  TempSrc: Oral  PainSc: 4       Patients Stated Pain Goal: 5 (35/59/74 1638)  Complications: No complications documented.

## 2021-01-21 NOTE — Anesthesia Procedure Notes (Signed)
Procedure Name: LMA Insertion Date/Time: 01/21/2021 12:17 PM Performed by: Gwyndolyn Saxon, CRNA Pre-anesthesia Checklist: Patient identified, Emergency Drugs available, Suction available and Patient being monitored Patient Re-evaluated:Patient Re-evaluated prior to induction Oxygen Delivery Method: Circle system utilized Preoxygenation: Pre-oxygenation with 100% oxygen Induction Type: IV induction Ventilation: Mask ventilation without difficulty LMA: LMA inserted LMA Size: 4.0 Number of attempts: 1 Placement Confirmation: positive ETCO2 and breath sounds checked- equal and bilateral Tube secured with: Tape Dental Injury: Teeth and Oropharynx as per pre-operative assessment

## 2021-01-21 NOTE — H&P (Signed)
PREOPERATIVE H&P  Chief Complaint: right shoulder labral tear, biceps, acromioclavicular arthrosis  HPI: Meagan Flores is a 55 y.o. female who presents for surgical treatment of right shoulder labral tear, biceps, acromioclavicular arthrosis.  She denies any changes in medical history.  Past Medical History:  Diagnosis Date  . Aneurysm ([place]) 03/18/2020  . 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 COVID-19 virus infection 02/14/2020  . 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) tear of right shoulder 09/30/2020  . Essential hypertension   . Glenohumeral arthritis 12/27/2014  . History of stroke   . History of TIA (transient ischemic attack) 02/14/2020  . Hyperlipidemia   . Hypothyroidism   . Impingement syndrome of right shoulder 12/24/2016  . Mixed hyperlipidemia   . 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 Prediabetes   . Preop cardiovascular exam 11/25/2020  . Pulmonary hypertension, unspecified ([place]) 11/25/2020  . 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 Rotator cuff impingement syndrome of right shoulder 09/30/2020  . Stroke ([place])   . Tendinopathy of right biceps tendon 09/30/2020  . Thyroid disease   . TIA (transient ischemic attack) 04/15/2018  . Tobacco use 09/28/2016   Past Surgical History:  Procedure Laterality Date  . PARTIAL HYSTERECTOMY    . right arm sx    . SHOULDER ARTHROSCOPY WITH DISTAL CLAVICLE RESECTION Right 01/12/2017   Procedure: RIGHT SHOULDER ARTHROSCOPY WITH  SUBACROMIAL DECOMPRESSION, DISTAL CLAVICLE EXCISION, extensive debridement;  Surgeon: Leandrew Koyanagi, MD;  Location: [facility];  Service: Orthopedics;  Laterality: Right;  . THYMECTOMY  2004  . THYROIDECTOMY     Social History   Socioeconomic History  . Marital status: Married    Spouse name: Not on file  . Number of children: Not on file  . Years of education: Not on file  . Highest education level: Not on file  Occupational History  . Not on file   Tobacco Use  . Smoking status: Current Every Day Smoker    Packs/day: 0.25    Years: 34.00    Pack years: 8.50  . Smokeless tobacco: Never Used  Vaping Use  . Vaping Use: Never used  Substance and Sexual Activity  . Alcohol use: Yes    Comment: occassional  . Drug use: No  . Sexual activity: Not on file  Other Topics Concern  . Not on file  Social History Narrative  . Not on file   Social Determinants of Health   Financial Resource Strain: Not on file  Food Insecurity: Not on file  Transportation Needs: Not on file  Physical Activity: Not on file  Stress: Not on file  Social Connections: Not on file   Family History  Problem Relation Age of Onset  . Stomach cancer Sister   . Colon polyps Sister   . Esophageal cancer Neg Hx   . Rectal cancer Neg Hx   . Colon cancer Neg Hx   . Breast cancer Neg Hx    Allergies  Allergen Reactions  . Sulfa Antibiotics Hives   Prior to Admission medications   Medication Sig Start Date End Date Taking? Authorizing Provider  acetaminophen (TYLENOL) 325 MG tablet Take 2 tablets (650 mg total) by mouth every 6 (six) hours as needed for up to 30 doses for mild pain or moderate pain. 11/02/20  Yes Wyvonnia Dusky, MD  aspirin EC 81 MG tablet Take 1 tablet (81 mg total) by mouth daily. 02/14/20  Yes Elsie Stain, MD  atorvastatin (LIPITOR) 40 MG tablet Take  1 tablet (40 mg total) by mouth daily at 6 PM. 10/21/20  Yes Elsie Stain, MD  EUTHYROX 50 MCG tablet Take 2 tablets by mouth once daily 01/10/21  Yes Elsie Stain, MD  ibuprofen (ADVIL) 600 MG tablet Take 1 tablet (600 mg total) by mouth every 6 (six) hours as needed for up to 30 doses for mild pain or moderate pain. 11/02/20  Yes Wyvonnia Dusky, MD  valsartan (DIOVAN) 160 MG tablet Take 1 tablet (160 mg total) by mouth daily. 12/22/20   Elsie Stain, MD     Positive ROS: All other systems have been reviewed and were otherwise negative with the exception of those  mentioned in the HPI and as above.  Physical Exam: General: Alert, no acute distress Cardiovascular: No pedal edema Respiratory: No cyanosis, no use of accessory musculature GI: abdomen soft Skin: No lesions in the area of chief complaint Neurologic: Sensation intact distally Psychiatric: Patient is competent for consent with normal mood and affect Lymphatic: no lymphedema  MUSCULOSKELETAL: exam stable  Assessment: right shoulder labral tear, biceps, acromioclavicular arthrosis  Plan: Plan for Procedure(s): RIGHT SHOULDER ARTHROSCOPY WITH EXTENSIVE DEBRIDEMENT, SUBACROMIAL DECOMPRESSION AND BICEPS TENODESIS  The risks benefits and alternatives were discussed with the patient including but not limited to the risks of nonoperative treatment, versus surgical intervention including infection, bleeding, nerve injury,  blood clots, cardiopulmonary complications, morbidity, mortality, among others, and they were willing to proceed.   Preoperative templating of the joint replacement has been completed, documented, and submitted to the Operating Room personnel in order to optimize intra-operative equipment management.   Eduard Roux, MD 01/21/2021 10:18 AM

## 2021-01-21 NOTE — Progress Notes (Signed)
Assisted Dr. Miller with right, ultrasound guided, interscalene  block. Side rails up, monitors on throughout procedure. See vital signs in flow sheet. Tolerated Procedure well. 

## 2021-01-22 ENCOUNTER — Encounter (HOSPITAL_BASED_OUTPATIENT_CLINIC_OR_DEPARTMENT_OTHER): Payer: Self-pay | Admitting: Orthopaedic Surgery

## 2021-01-22 NOTE — Addendum Note (Signed)
Addendum  created 01/22/21 0739 by Maryella Shivers, CRNA   Charge Capture section accepted

## 2021-01-28 ENCOUNTER — Other Ambulatory Visit: Payer: Self-pay

## 2021-01-28 ENCOUNTER — Ambulatory Visit (INDEPENDENT_AMBULATORY_CARE_PROVIDER_SITE_OTHER): Payer: Commercial Managed Care - PPO | Admitting: Physician Assistant

## 2021-01-28 ENCOUNTER — Encounter: Payer: Self-pay | Admitting: Orthopaedic Surgery

## 2021-01-28 DIAGNOSIS — Z9889 Other specified postprocedural states: Secondary | ICD-10-CM

## 2021-01-28 NOTE — Progress Notes (Signed)
Post-Op Visit Note   Patient: Meagan Flores           Date of Birth: October 01, 1966           MRN: [id number] Visit Date: 01/28/2021 PCP: Elsie Stain, MD   Assessment & Plan:  Chief Complaint:  Chief Complaint  Patient presents with  . Right Shoulder - Pain, Routine Post Op   Visit Diagnoses:  1. S/P arthroscopy of right shoulder     Plan: Patient is a pleasant 55 year old female who comes in today 1 week out right shoulder arthroscopic debridement, decompression and biceps tenotomy.  She has been doing okay.  She has been in a moderate amount of pain.  Examination of her right shoulder reveals fully healed surgical portals without complication.  She is neurovascular intact distally.  Today, sutures were removed and Steri-Strips applied.  Intraoperative pictures reviewed.  She can discontinue the sling.  We will go ahead and start her in outpatient physical therapy and internal referral has been made.  Follow-up with [place] in 5 weeks time for recheck.  Call with concerns or questions.  Follow-Up Instructions: Return in about 5 weeks (around 03/04/2021).   Orders:  Orders Placed This Encounter  Procedures  . Ambulatory referral to Physical Therapy   No orders of the defined types were placed in this encounter.   Imaging: No new imaging  PMFS History: Patient Active Problem List   Diagnosis Date Noted  . Stroke ([place])   . Thyroid disease   . Hypothyroidism   . Preop cardiovascular exam 11/25/2020  . Pulmonary hypertension, unspecified ([place]) 11/25/2020  . Prediabetes   . Hyperlipidemia   . 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) tear of right shoulder 09/30/2020  . Rotator cuff impingement syndrome of right shoulder 09/30/2020  . 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 Aneurysm ([place]) 03/18/2020  . History of TIA (transient ischemic attack) 02/14/2020  . COVID-19 virus infection 02/14/2020  . TIA  (transient ischemic attack) 04/15/2018  . History of stroke   . Mixed hyperlipidemia   . Essential hypertension   . Impingement syndrome of right shoulder 12/24/2016  . Tobacco use 09/28/2016  . Glenohumeral arthritis 12/27/2014  . 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   Past Medical History:  Diagnosis Date  . Aneurysm ([place]) 03/18/2020  . 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 COVID-19 virus infection 02/14/2020  . 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) tear of right shoulder 09/30/2020  . Essential hypertension   . Glenohumeral arthritis 12/27/2014  . History of stroke   . History of TIA (transient ischemic attack) 02/14/2020  . Hyperlipidemia   . Hypothyroidism   . Impingement syndrome of right shoulder 12/24/2016  . Mixed hyperlipidemia   . 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 Prediabetes   . Preop cardiovascular exam 11/25/2020  . Pulmonary hypertension, unspecified ([place]) 11/25/2020  . 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 Rotator cuff impingement syndrome of right shoulder 09/30/2020  . Stroke ([place])   . Tendinopathy of right biceps tendon 09/30/2020  . Thyroid disease   . TIA (transient ischemic attack) 04/15/2018  . Tobacco use 09/28/2016    Family History  Problem Relation Age of Onset  . Stomach cancer Sister   . Colon polyps Sister   . Esophageal cancer Neg Hx   . Rectal cancer Neg Hx   . Colon cancer Neg Hx   . Breast cancer Neg Hx     Past Surgical History:  Procedure Laterality Date  .  PARTIAL HYSTERECTOMY    . right arm sx    . SHOULDER ARTHROSCOPY WITH BICEPSTENOTOMY Right 01/21/2021   Procedure: SHOULDER ARTHROSCOPY WITH BICEPSTENOTOMY;  Surgeon: Leandrew Koyanagi, MD;  Location: [facility];  Service: Orthopedics;  Laterality: Right;  . SHOULDER ARTHROSCOPY WITH DISTAL CLAVICLE RESECTION Right 01/12/2017   Procedure: RIGHT SHOULDER ARTHROSCOPY WITH  SUBACROMIAL DECOMPRESSION, DISTAL CLAVICLE EXCISION, extensive  debridement;  Surgeon: Leandrew Koyanagi, MD;  Location: [facility];  Service: Orthopedics;  Laterality: Right;  . THYMECTOMY  2004  . THYROIDECTOMY     Social History   Occupational History  . Not on file  Tobacco Use  . Smoking status: Current Every Day Smoker    Packs/day: 0.25    Years: 34.00    Pack years: 8.50  . Smokeless tobacco: Never Used  Vaping Use  . Vaping Use: Never used  Substance and Sexual Activity  . Alcohol use: Yes    Comment: occassional  . Drug use: No  . Sexual activity: Not on file

## 2021-01-30 ENCOUNTER — Ambulatory Visit: Payer: Commercial Managed Care - PPO | Attending: Physician Assistant

## 2021-01-30 ENCOUNTER — Other Ambulatory Visit: Payer: Self-pay

## 2021-01-30 DIAGNOSIS — M25511 Pain in right shoulder: Secondary | ICD-10-CM | POA: Diagnosis not present

## 2021-01-30 DIAGNOSIS — M25512 Pain in left shoulder: Secondary | ICD-10-CM | POA: Diagnosis present

## 2021-01-30 DIAGNOSIS — G8929 Other chronic pain: Secondary | ICD-10-CM

## 2021-01-30 DIAGNOSIS — M6281 Muscle weakness (generalized): Secondary | ICD-10-CM | POA: Insufficient documentation

## 2021-01-30 DIAGNOSIS — M25611 Stiffness of right shoulder, not elsewhere classified: Secondary | ICD-10-CM | POA: Diagnosis present

## 2021-01-30 DIAGNOSIS — M25612 Stiffness of left shoulder, not elsewhere classified: Secondary | ICD-10-CM | POA: Diagnosis present

## 2021-01-30 DIAGNOSIS — R293 Abnormal posture: Secondary | ICD-10-CM | POA: Diagnosis present

## 2021-01-30 DIAGNOSIS — M542 Cervicalgia: Secondary | ICD-10-CM | POA: Diagnosis present

## 2021-01-30 NOTE — Therapy (Signed)
[facility]. [place], [place], [address] Phone: [phone]   Fax:  [phone]  Physical Therapy Evaluation  Patient Details  Name: Meagan Flores MRN: [id number] Date of Birth: [birth year] Referring Provider (PT): Dwana Melena PA-C for Dr. Erlinda Hong   Encounter Date: 01/30/2021   PT End of Session - 01/30/21 1220    Visit Number 1    Number of Visits 17    Date for PT Re-Evaluation 03/27/21    PT Start Time 0800    PT Stop Time 0845    PT Time Calculation (min) 45 min           Past Medical History:  Diagnosis Date  . Aneurysm ([place]) 03/18/2020  . 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 COVID-19 virus infection 02/14/2020  . 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) tear of right shoulder 09/30/2020  . Essential hypertension   . Glenohumeral arthritis 12/27/2014  . History of stroke   . History of TIA (transient ischemic attack) 02/14/2020  . Hyperlipidemia   . Hypothyroidism   . Impingement syndrome of right shoulder 12/24/2016  . Mixed hyperlipidemia   . 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 Prediabetes   . Preop cardiovascular exam 11/25/2020  . Pulmonary hypertension, unspecified ([place]) 11/25/2020  . 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 Rotator cuff impingement syndrome of right shoulder 09/30/2020  . Stroke ([place])   . Tendinopathy of right biceps tendon 09/30/2020  . Thyroid disease   . TIA (transient ischemic attack) 04/15/2018  . Tobacco use 09/28/2016    Past Surgical History:  Procedure Laterality Date  . PARTIAL HYSTERECTOMY    . right arm sx    . SHOULDER ARTHROSCOPY WITH BICEPSTENOTOMY Right 01/21/2021   Procedure: SHOULDER ARTHROSCOPY WITH BICEPSTENOTOMY;  Surgeon: Leandrew Koyanagi, MD;  Location: [facility];  Service: Orthopedics;  Laterality: Right;  . SHOULDER ARTHROSCOPY WITH DISTAL CLAVICLE RESECTION Right 01/12/2017   Procedure: RIGHT SHOULDER  ARTHROSCOPY WITH  SUBACROMIAL DECOMPRESSION, DISTAL CLAVICLE EXCISION, extensive debridement;  Surgeon: Leandrew Koyanagi, MD;  Location: [facility];  Service: Orthopedics;  Laterality: Right;  . THYMECTOMY  2004  . THYROIDECTOMY      There were no vitals filed for this visit.    Subjective Assessment - 01/30/21 0804    Subjective 01/21/2021 underwent  R SHOULDER ARTHROSCOPY WITH SUBACROMIAL DECOMPRESSION, ROTATOR CUFF REPAIR AND BICEP TENDON REPAIR. Has not been sleeping too well because of achy pain.Followed up with surgeon Dr Erlinda Hong on Wednesday 01/28/2021 and was told to d/c sling, reports having increased achiness that day. R am pain hasebeen so intense, not much help from pain medications, intense to point of nausea.    Pertinent History History of surgery on R shoulder x2  and elbow surgery x2.    Patient Stated Goals decreae pain, get back to PLOF    Currently in Pain? Yes    Pain Score 8     Pain Location Shoulder    Pain Orientation Right    Pain Descriptors / Indicators Aching;Sore    Pain Type Acute pain;Surgical pain    Pain Radiating Towards Right hand with achiness since recent surgery    Pain Onset 1 to 4 weeks ago    Pain Frequency Constant    Effect of Pain on Daily Activities Unable to do housework, hygeine, on short term disability at work - unable to complete work duties    Multiple Pain Sites Yes    Pain  Score 9    Pain Orientation Right    Pain Descriptors / Indicators Aching;Tightness    Pain Type Acute pain    Aggravating Factors  nothing helps              [facility] PT Assessment - 01/30/21 0807      Assessment   Medical Diagnosis S/p R SHOULDER ARTHROSCOPY WITH SUBACROMIAL DECOMPRESSION, ROTATOR CUFF REPAIR AND BICEP TENDON REPAIR 01/21/2021    Referring Provider (PT) Dwana Melena PA-C for Dr. Eliot Ford Dominance Right    Next MD Visit 03/04/2021    Prior Therapy Had PT for R shoulder after first surgery 3 yrs ago      Precautions   Precautions  Shoulder      Home Environment   Additional Comments lives with husband daughter and granddaughter, house, 1 level      Prior Function   Vocation Full time employment    Vocation Requirements [employer] in [place] - working on [employer]      Observation/Other Assessments   Observations grossly guarded into B shoulders and neck      ROM / Strength   AROM / PROM / Strength PROM;AROM;Strength      AROM   Overall AROM Comments Shoulder:LEFT  Flexion 170, Abd 110 limited by pain      PROM   Overall PROM Comments Shoulder: Right Flexion 70 deg limited by pain, Abd 35 deg limited by pain. Full elbow flexion/ext  B      Strength   Overall Strength Comments Grossly 4/5 for all motions on the left shoulder. Grip strength L 55# R (dominant) 15#      Palpation   Palpation comment TTP and mms tautness throughout neck UT, LS, suboccipitals. Some tightness near surgical incisions but overall well healing.            Posture: forward head, rounded shoulders          Objective measurements completed on examination: See above findings.               PT Education - 01/30/21 1214    Education Details Initial PT POC and HEP. Access code: [email or web address] . Educated on maintaining pain free motions with exercises. Using ice as needed to manage pain/inflammation   Person(s) Educated Patient    Methods Explanation;Demonstration;Handout    Comprehension Verbalized understanding;Returned demonstration;Need further instruction            PT Short Term Goals - 01/30/21 0845      PT SHORT TERM GOAL #1   Title Independent with initial HEP    Time 2    Period Weeks    Status New    Target Date 02/13/21             PT Long Term Goals - 01/30/21 1221      PT LONG TERM GOAL #1   Title Independent with advanced HEP    Time 8    Period Weeks    Status New    Target Date 01/30/21      PT LONG TERM GOAL #2   Title Pt will be able to return to performing some  household chores and grooming utilizing R arm    Baseline unable to do household chores at eval    Time 8    Period Weeks    Status New    Target Date 03/27/21      PT LONG TERM GOAL #3  Title Pt will demo R shoulder ROM symmetrical to left side    Time 8    Period Weeks    Status New    Target Date 03/27/21      PT LONG TERM GOAL #4   Title Grip strength on the right to be greater than or equal to left to facilitate return to job duties, grasp with functional activities.    Time 8    Period Weeks    Status New    Target Date 03/27/21                  Plan - 01/30/21 1215    Clinical Impression Statement Pt is a 55 yo female who presents 9 days s/p R shoulder arthroscopy with subacromial decompression, rotator cuff repair, and bicep tendon repair. Post op protocol not available at time of evaluation therefore evaluation components were conservative in nature. Pt currently presents with R shoulder pain, pain limited R shoulder PROM, Neck pain, L shoulder pain and limited ROM, muscle weakness, diminished grip strength. 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 [place] will benefit from skilled physical therapy at this time to work on decreasing pain and progressing as per rehab protocol to return to UE PLOF.    Stability/Clinical Decision Making Stable/Uncomplicated    Clinical Decision Making Low    Rehab Potential Good    PT Frequency 2x / week    PT Duration 8 weeks    PT Treatment/Interventions Vasopneumatic Device;Taping;Electrical Stimulation;Cryotherapy;ADLs/Self Care Home Management;Iontophoresis 4mg /ml Dexamethasone;Moist Heat;Neuromuscular re-education;Therapeutic exercise;Therapeutic activities;Functional mobility training;Patient/family education;Manual techniques;Scar mobilization    PT Next Visit  Plan Reassess HEP. Shoulder FOTO. Slowly progress  PROM/AAROM per protocol - today patient is 9 days p/o. Grip strengthening, wrist/hand ROM and strengthening. Manual and modalities as needed     PT Home Exercise Plan HEP access code [email or web address]    Consulted and Agree with Plan of Care Patient           Patient will benefit from skilled therapeutic intervention in order to improve the following deficits and impairments:  Decreased range of motion,Increased fascial restricitons,Impaired UE functional use,Pain,Improper body mechanics,Impaired flexibility,Hypomobility,Decreased balance,Decreased mobility,Decreased strength,Increased edema,Postural dysfunction  Visit Diagnosis: Acute pain of right shoulder - Plan: PT plan of care cert/re-cert  Cervicalgia - Plan: PT plan of care cert/re-cert  Stiffness of right shoulder, not elsewhere classified - Plan: PT plan of care cert/re-cert  Chronic left shoulder pain - Plan: PT plan of care cert/re-cert  Stiffness of left shoulder, not elsewhere classified - Plan: PT plan of care cert/re-cert  Muscle weakness (generalized) - Plan: PT plan of care cert/re-cert  Abnormal posture - Plan: PT plan of care cert/re-cert     Problem List Patient Active Problem List   Diagnosis Date Noted  . Stroke ([place])   . Thyroid disease   . Hypothyroidism   . Preop cardiovascular exam 11/25/2020  . Pulmonary hypertension, unspecified ([place]) 11/25/2020  . Prediabetes   . Hyperlipidemia   . 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) tear of right shoulder 09/30/2020  . Rotator cuff impingement syndrome of right shoulder 09/30/2020  . 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 Aneurysm ([place]) 03/18/2020  . History of TIA (transient ischemic attack) 02/14/2020  . COVID-19 virus infection 02/14/2020  .  TIA (transient ischemic attack) 04/15/2018  . History of stroke   . Mixed hyperlipidemia   .  Essential hypertension   . Impingement syndrome of right shoulder 12/24/2016  . Tobacco use 09/28/2016  . Glenohumeral arthritis 12/27/2014  . 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 [place], [place], [address] Phone: [phone]   Fax:  [phone]  Name: AERIKA GROLL MRN: [id number] Date of Birth: Apr 20, 1966

## 2021-01-30 NOTE — Patient Instructions (Signed)
Access Code: [email or web address] URL: [email or web address] Date: 01/30/2021 Prepared by: Sherlynn Stalls  Exercises Ice - 1 x daily - 5 x weekly - 15-20 minutes hold Seated Upper Trapezius Stretch - 1 x daily - 7 x weekly - 2 sets - 30 seconds hold Flexion-Extension Shoulder Pendulum with Table Support - 1 x daily - 7 x weekly - 3 sets - 1 minute hold Seated Scapular Retraction - 1 x daily - 7 x weekly - 2 sets - 10 reps - 5 seconds hold Seated Gripping Towel - 1 x daily - 7 x weekly - 2 sets - 10 reps - 5 sec hold

## 2021-02-03 ENCOUNTER — Ambulatory Visit: Payer: Commercial Managed Care - PPO | Admitting: Physical Therapy

## 2021-02-03 ENCOUNTER — Encounter: Payer: Self-pay | Admitting: Physical Therapy

## 2021-02-03 ENCOUNTER — Other Ambulatory Visit: Payer: Self-pay

## 2021-02-03 DIAGNOSIS — M25611 Stiffness of right shoulder, not elsewhere classified: Secondary | ICD-10-CM

## 2021-02-03 DIAGNOSIS — M25511 Pain in right shoulder: Secondary | ICD-10-CM

## 2021-02-03 DIAGNOSIS — M542 Cervicalgia: Secondary | ICD-10-CM

## 2021-02-03 NOTE — Therapy (Signed)
[place] [place]. [place], [place], [address] Phone: [phone]   Fax:  [phone]  Physical Therapy Treatment  Patient Details  Name: Meagan Flores MRN: [id number] Date of Birth: 05/12/1966 Referring Provider (PT): Dwana Melena PA-C for Dr. Erlinda Hong   Encounter Date: 02/03/2021   PT End of Session - 02/03/21 1339    Visit Number 2    Number of Visits 17    Date for PT Re-Evaluation 03/27/21    PT Start Time 2993    PT Stop Time 1346    PT Time Calculation (min) 47 min           Past Medical History:  Diagnosis Date  . Aneurysm ([place]) 03/18/2020  . 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 COVID-19 virus infection 02/14/2020  . 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) tear of right shoulder 09/30/2020  . Essential hypertension   . Glenohumeral arthritis 12/27/2014  . History of stroke   . History of TIA (transient ischemic attack) 02/14/2020  . Hyperlipidemia   . Hypothyroidism   . Impingement syndrome of right shoulder 12/24/2016  . Mixed hyperlipidemia   . 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 Prediabetes   . Preop cardiovascular exam 11/25/2020  . Pulmonary hypertension, unspecified ([place]) 11/25/2020  . 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 Rotator cuff impingement syndrome of right shoulder 09/30/2020  . Stroke ([place])   . Tendinopathy of right biceps tendon 09/30/2020  . Thyroid disease   . TIA (transient ischemic attack) 04/15/2018  . Tobacco use 09/28/2016    Past Surgical History:  Procedure Laterality Date  . PARTIAL HYSTERECTOMY    . right arm sx    . SHOULDER ARTHROSCOPY WITH BICEPSTENOTOMY Right 01/21/2021   Procedure: SHOULDER ARTHROSCOPY WITH BICEPSTENOTOMY;  Surgeon: Leandrew Koyanagi, MD;  Location: [facility];  Service: Orthopedics;  Laterality: Right;  . SHOULDER ARTHROSCOPY WITH DISTAL CLAVICLE RESECTION Right 01/12/2017   Procedure: RIGHT SHOULDER  ARTHROSCOPY WITH  SUBACROMIAL DECOMPRESSION, DISTAL CLAVICLE EXCISION, extensive debridement;  Surgeon: Leandrew Koyanagi, MD;  Location: [facility];  Service: Orthopedics;  Laterality: Right;  . THYMECTOMY  2004  . THYROIDECTOMY      There were no vitals filed for this visit.   Subjective Assessment - 02/03/21 1301    Subjective "Not too good today"    Currently in Pain? Yes    Pain Score 8     Pain Location Shoulder    Pain Orientation Right                             [facility] Adult PT Treatment/Exercise - 02/03/21 0001      Exercises   Exercises Shoulder      Shoulder Exercises: Standing   Other Standing Exercises AAROM ball on table x20 each      Shoulder Exercises: Pulleys   Flexion 3 minutes      Modalities   Modalities Vasopneumatic      Vasopneumatic   Number Minutes Vasopneumatic  10 minutes    Vasopnuematic Location  Shoulder    Vasopneumatic Pressure Low    Vasopneumatic Temperature  34      Manual Therapy   Manual Therapy Passive ROM    Passive ROM R shoulder all directions                    PT Short Term Goals - 02/03/21 1341  PT SHORT TERM GOAL #1   Title Independent with initial HEP    Status Achieved             PT Long Term Goals - 01/30/21 1221      PT LONG TERM GOAL #1   Title Independent with advanced HEP    Time 8    Period Weeks    Status New    Target Date 01/30/21      PT LONG TERM GOAL #2   Title Pt will be able to return to performing some household chores and grooming utilizing R arm    Baseline unable to do household chores at eval    Time 8    Period Weeks    Status New    Target Date 03/27/21      PT LONG TERM GOAL #3   Title Pt will demo R shoulder ROM symmetrical to left side    Time 8    Period Weeks    Status New    Target Date 03/27/21      PT LONG TERM GOAL #4   Title Grip strength on the right to be greater than or equal to left to facilitate return to job  duties, grasp with functional activities.    Time 8    Period Weeks    Status New    Target Date 03/27/21                 Plan - 02/03/21 1343    Clinical Impression Statement Pt tolerated an initial progression to therapy well. Very slow movement with PROM ball rolling. Some caution with pulley interventions. Some pain with MT at the end range, cues needed to relax and not guard with MT.    Stability/Clinical Decision Making Stable/Uncomplicated    Rehab Potential Good    PT Frequency 2x / week    PT Duration 8 weeks    PT Treatment/Interventions Vasopneumatic Device;Taping;Electrical Stimulation;Cryotherapy;ADLs/Self Care Home Management;Iontophoresis 4mg /ml Dexamethasone;Moist Heat;Neuromuscular re-education;Therapeutic exercise;Therapeutic activities;Functional mobility training;Patient/family education;Manual techniques;Scar mobilization    PT Next Visit Plan Slowly progress PROM/AAROM per protocol  madalitirs as needed           Patient will benefit from skilled therapeutic intervention in order to improve the following deficits and impairments:  Decreased range of motion,Increased fascial restricitons,Impaired UE functional use,Pain,Improper body mechanics,Impaired flexibility,Hypomobility,Decreased balance,Decreased mobility,Decreased strength,Increased edema,Postural dysfunction  Visit Diagnosis: Acute pain of right shoulder  Stiffness of right shoulder, not elsewhere classified  Cervicalgia     Problem List Patient Active Problem List   Diagnosis Date Noted  . Stroke ([place])   . Thyroid disease   . Hypothyroidism   . Preop cardiovascular exam 11/25/2020  . Pulmonary hypertension, unspecified ([place]) 11/25/2020  . Prediabetes   . Hyperlipidemia   . 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) tear of right shoulder 09/30/2020  . Rotator cuff impingement syndrome of right shoulder 09/30/2020  . 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   . 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 Aneurysm ([place]) 03/18/2020  . History of TIA (transient ischemic attack) 02/14/2020  . COVID-19 virus infection 02/14/2020  . TIA (transient ischemic attack) 04/15/2018  . History of stroke   . Mixed hyperlipidemia   . Essential hypertension   . Impingement syndrome of right shoulder 12/24/2016  . Tobacco use 09/28/2016  . Glenohumeral arthritis 12/27/2014  . 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    Scot Jun, PTA 02/03/2021, 1:45 PM  [facility] [phone] [place], [place], [address] Phone: [phone]   Fax:  [phone]  Name: Meagan Flores MRN: [id number] Date of Birth: 11/26/1966

## 2021-02-05 ENCOUNTER — Ambulatory Visit: Payer: Commercial Managed Care - PPO

## 2021-02-05 ENCOUNTER — Other Ambulatory Visit: Payer: Self-pay

## 2021-02-05 DIAGNOSIS — M25512 Pain in left shoulder: Secondary | ICD-10-CM

## 2021-02-05 DIAGNOSIS — M542 Cervicalgia: Secondary | ICD-10-CM

## 2021-02-05 DIAGNOSIS — M25611 Stiffness of right shoulder, not elsewhere classified: Secondary | ICD-10-CM

## 2021-02-05 DIAGNOSIS — R293 Abnormal posture: Secondary | ICD-10-CM

## 2021-02-05 DIAGNOSIS — M25511 Pain in right shoulder: Secondary | ICD-10-CM

## 2021-02-05 DIAGNOSIS — M6281 Muscle weakness (generalized): Secondary | ICD-10-CM

## 2021-02-05 DIAGNOSIS — G8929 Other chronic pain: Secondary | ICD-10-CM

## 2021-02-05 DIAGNOSIS — M25612 Stiffness of left shoulder, not elsewhere classified: Secondary | ICD-10-CM

## 2021-02-05 NOTE — Therapy (Signed)
[place]. [place], [place], [address] Phone: [phone]   Fax:  [phone]  Physical Therapy Treatment  Patient Details  Name: Meagan Flores MRN: [id number] Date of Birth: October 10, 1966 Referring Provider (PT): Dwana Melena PA-C for Dr. Erlinda Hong   Encounter Date: 02/05/2021   PT End of Session - 02/05/21 1234    Visit Number 3    Number of Visits 17    Date for PT Re-Evaluation 03/27/21    PT Start Time 0800    PT Stop Time 0845    PT Time Calculation (min) 45 min    Activity Tolerance Patient tolerated treatment well;Patient limited by pain    Behavior During Therapy [facility] for tasks assessed/performed           Past Medical History:  Diagnosis Date  . Aneurysm ([place]) 03/18/2020  . 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 COVID-19 virus infection 02/14/2020  . 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) tear of right shoulder 09/30/2020  . Essential hypertension   . Glenohumeral arthritis 12/27/2014  . History of stroke   . History of TIA (transient ischemic attack) 02/14/2020  . Hyperlipidemia   . Hypothyroidism   . Impingement syndrome of right shoulder 12/24/2016  . Mixed hyperlipidemia   . 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 Prediabetes   . Preop cardiovascular exam 11/25/2020  . Pulmonary hypertension, unspecified ([place]) 11/25/2020  . 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 Rotator cuff impingement syndrome of right shoulder 09/30/2020  . Stroke ([place])   . Tendinopathy of right biceps tendon 09/30/2020  . Thyroid disease   . TIA (transient ischemic attack) 04/15/2018  . Tobacco use 09/28/2016    Past Surgical History:  Procedure Laterality Date  . PARTIAL HYSTERECTOMY    . right arm sx    . SHOULDER ARTHROSCOPY WITH BICEPSTENOTOMY Right 01/21/2021   Procedure: SHOULDER ARTHROSCOPY WITH BICEPSTENOTOMY;  Surgeon: Leandrew Koyanagi, MD;  Location: [facility];  Service:  Orthopedics;  Laterality: Right;  . SHOULDER ARTHROSCOPY WITH DISTAL CLAVICLE RESECTION Right 01/12/2017   Procedure: RIGHT SHOULDER ARTHROSCOPY WITH  SUBACROMIAL DECOMPRESSION, DISTAL CLAVICLE EXCISION, extensive debridement;  Surgeon: Leandrew Koyanagi, MD;  Location: [facility];  Service: Orthopedics;  Laterality: Right;  . THYMECTOMY  2004  . THYROIDECTOMY      There were no vitals filed for this visit.   Subjective Assessment - 02/05/21 0802    Subjective Pt reports her shoulder hurt a lot more after therapy and all the exercises. "I hadn't slept all night".    Currently in Pain? Yes    Pain Score 8     Pain Location Shoulder    Pain Orientation Right    Pain Descriptors / Indicators Sharp    Pain Type Acute pain;Surgical pain              [facility] PT Assessment - 02/05/21 0001      PROM   PROM Assessment Site Shoulder    Right/Left Shoulder Right    Right Shoulder Flexion 90 Degrees   sig guarding, pain   Right Shoulder ABduction 80 Degrees   pain   Right Shoulder External Rotation 50 Degrees   at 45 deg abduction                        [facility] Adult PT Treatment/Exercise - 02/05/21 0001      Shoulder Exercises: Seated   Retraction Both;15 reps  Retraction Limitations 3 seconds      Shoulder Exercises: Standing   Retraction 15 reps    Retraction Limitations at corner of doorway      Shoulder Exercises: Stretch   Other Shoulder Stretches Table flexion x30" to 103      Manual Therapy   Manual Therapy Joint mobilization;Soft tissue mobilization;Passive ROM    Manual therapy comments supine    Joint Mobilization post/inf glides grade 2-3   hypomobility post glide that released with sustained holds   Soft tissue mobilization STM UT, LS, pecs    Passive ROM R shoulder all planes to tolerance                  PT Education - 02/05/21 1233    Education Details Shoulder positioning, modalities (ice/heat)    Person(s) Educated Patient     Methods Explanation;Demonstration;Tactile cues;Verbal cues    Comprehension Verbalized understanding;Returned demonstration;Need further instruction;Verbal cues required;Tactile cues required            PT Short Term Goals - 02/03/21 1341      PT SHORT TERM GOAL #1   Title Independent with initial HEP    Status Achieved             PT Long Term Goals - 01/30/21 1221      PT LONG TERM GOAL #1   Title Independent with advanced HEP    Time 8    Period Weeks    Status New    Target Date 01/30/21      PT LONG TERM GOAL #2   Title Pt will be able to return to performing some household chores and grooming utilizing R arm    Baseline unable to do household chores at eval    Time 8    Period Weeks    Status New    Target Date 03/27/21      PT LONG TERM GOAL #3   Title Pt will demo R shoulder ROM symmetrical to left side    Time 8    Period Weeks    Status New    Target Date 03/27/21      PT LONG TERM GOAL #4   Title Grip strength on the right to be greater than or equal to left to facilitate return to job duties, grasp with functional activities.    Time 8    Period Weeks    Status New    Target Date 03/27/21                 Plan - 02/05/21 1235    Clinical Impression Statement Pt continues to exhibit significant guarding and protective positioning of shoulder. She did tolerate session well with gentle manual therapy, as well as retractions in sitting/standing for positioning. Educated on passive pendulums versus AROM to avoid pain and maintain precautions. Initiated table slide flexion stretch, passively placing arm on high mat table and not putting weight through it, as she used her body to increase the stretch. PROM 90 in supine, 103 with table slides    Stability/Clinical Decision Making Stable/Uncomplicated    Rehab Potential Good    PT Frequency 2x / week    PT Duration 8 weeks    PT Treatment/Interventions Vasopneumatic Device;Taping;Electrical  Stimulation;Cryotherapy;ADLs/Self Care Home Management;Iontophoresis 4mg /ml Dexamethasone;Moist Heat;Neuromuscular re-education;Therapeutic exercise;Therapeutic activities;Functional mobility training;Patient/family education;Manual techniques;Scar mobilization    PT Next Visit Plan Slowly progress PROM/AAROM per protocol  madalities as needed  Patient will benefit from skilled therapeutic intervention in order to improve the following deficits and impairments:  Decreased range of motion,Increased fascial restricitons,Impaired UE functional use,Pain,Improper body mechanics,Impaired flexibility,Hypomobility,Decreased balance,Decreased mobility,Decreased strength,Increased edema,Postural dysfunction  Visit Diagnosis: Acute pain of right shoulder  Stiffness of right shoulder, not elsewhere classified  Cervicalgia  Chronic left shoulder pain  Stiffness of left shoulder, not elsewhere classified  Muscle weakness (generalized)  Abnormal posture     Problem List Patient Active Problem List   Diagnosis Date Noted  . Stroke ([place])   . Thyroid disease   . Hypothyroidism   . Preop cardiovascular exam 11/25/2020  . Pulmonary hypertension, unspecified ([place]) 11/25/2020  . Prediabetes   . Hyperlipidemia   . 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) tear of right shoulder 09/30/2020  . Rotator cuff impingement syndrome of right shoulder 09/30/2020  . 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 Aneurysm ([place]) 03/18/2020  . History of TIA (transient ischemic attack) 02/14/2020  . COVID-19 virus infection 02/14/2020  . TIA (transient ischemic attack) 04/15/2018  . History of stroke   . Mixed hyperlipidemia   . Essential hypertension   . Impingement syndrome of right shoulder 12/24/2016  . Tobacco use 09/28/2016  . Glenohumeral arthritis 12/27/2014  . 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    Izell Old Green, PT,  DPT 02/05/2021, 12:39 PM  [place]. [place], [place], [address] Phone: [phone]   Fax:  [phone]  Name: Meagan Flores MRN: [id number] Date of Birth: 1966/02/03

## 2021-02-09 ENCOUNTER — Other Ambulatory Visit: Payer: Self-pay | Admitting: Critical Care Medicine

## 2021-02-10 ENCOUNTER — Ambulatory Visit: Payer: Commercial Managed Care - PPO

## 2021-02-10 ENCOUNTER — Other Ambulatory Visit: Payer: Self-pay

## 2021-02-10 DIAGNOSIS — M25612 Stiffness of left shoulder, not elsewhere classified: Secondary | ICD-10-CM

## 2021-02-10 DIAGNOSIS — M6281 Muscle weakness (generalized): Secondary | ICD-10-CM

## 2021-02-10 DIAGNOSIS — R293 Abnormal posture: Secondary | ICD-10-CM

## 2021-02-10 DIAGNOSIS — M542 Cervicalgia: Secondary | ICD-10-CM

## 2021-02-10 DIAGNOSIS — M25512 Pain in left shoulder: Secondary | ICD-10-CM

## 2021-02-10 DIAGNOSIS — M25511 Pain in right shoulder: Secondary | ICD-10-CM | POA: Diagnosis not present

## 2021-02-10 DIAGNOSIS — M25611 Stiffness of right shoulder, not elsewhere classified: Secondary | ICD-10-CM

## 2021-02-10 DIAGNOSIS — G8929 Other chronic pain: Secondary | ICD-10-CM

## 2021-02-10 NOTE — Therapy (Signed)
[place]. [place], [place], [address] Phone: [phone]   Fax:  [phone]  Physical Therapy Treatment  Patient Details  Name: Meagan Flores MRN: [id number] Date of Birth: [birth year] Referring Provider (PT): Dwana Melena PA-C for Dr. Erlinda Hong   Encounter Date: 02/10/2021   PT End of Session - 02/10/21 1013    Visit Number 4    Number of Visits 17    Date for PT Re-Evaluation 03/27/21    PT Start Time 0930    PT Stop Time 1020    PT Time Calculation (min) 50 min    Activity Tolerance Patient tolerated treatment well;Patient limited by pain    Behavior During Therapy [facility] for tasks assessed/performed           Past Medical History:  Diagnosis Date  . Aneurysm ([place]) 03/18/2020  . 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 COVID-19 virus infection 02/14/2020  . 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) tear of right shoulder 09/30/2020  . Essential hypertension   . Glenohumeral arthritis 12/27/2014  . History of stroke   . History of TIA (transient ischemic attack) 02/14/2020  . Hyperlipidemia   . Hypothyroidism   . Impingement syndrome of right shoulder 12/24/2016  . Mixed hyperlipidemia   . 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 Prediabetes   . Preop cardiovascular exam 11/25/2020  . Pulmonary hypertension, unspecified ([place]) 11/25/2020  . 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 Rotator cuff impingement syndrome of right shoulder 09/30/2020  . Stroke ([place])   . Tendinopathy of right biceps tendon 09/30/2020  . Thyroid disease   . TIA (transient ischemic attack) 04/15/2018  . Tobacco use 09/28/2016    Past Surgical History:  Procedure Laterality Date  . PARTIAL HYSTERECTOMY    . right arm sx    . SHOULDER ARTHROSCOPY WITH BICEPSTENOTOMY Right 01/21/2021   Procedure: SHOULDER ARTHROSCOPY WITH BICEPSTENOTOMY;  Surgeon: Leandrew Koyanagi, MD;  Location: [facility];  Service:  Orthopedics;  Laterality: Right;  . SHOULDER ARTHROSCOPY WITH DISTAL CLAVICLE RESECTION Right 01/12/2017   Procedure: RIGHT SHOULDER ARTHROSCOPY WITH  SUBACROMIAL DECOMPRESSION, DISTAL CLAVICLE EXCISION, extensive debridement;  Surgeon: Leandrew Koyanagi, MD;  Location: [facility];  Service: Orthopedics;  Laterality: Right;  . THYMECTOMY  2004  . THYROIDECTOMY      There were no vitals filed for this visit.   Subjective Assessment - 02/10/21 0933    Subjective Pt reports still doing exercises with no increased pain but otherwise still with same pain, going to the doctor next week because of left shoulder pain as well. Using some heat on the neck as well. Having trouble finding a comfortable position for sleep, a lot of achy pain in shoulder   Pertinent History History of surgery on R shoulder x2  and elbow surgery x2.    Patient Stated Goals decreae pain, get back to PLOF    Currently in Pain? Yes    Pain Score 7     Pain Location Shoulder    Pain Orientation Right             [facility] Adult PT Treatment/Exercise - 02/10/21 0001      Shoulder Exercises: Seated   Retraction Both;15 reps    Retraction Limitations 3 seconds      Shoulder Exercises: Standing   Retraction 15 reps      Shoulder Exercises: Stretch   Other Shoulder Stretches AA/P table flexion for R shoulder  x 10  gentle within pain free range, arm on foam roll limited to less than 80-90 deg today    Other Shoulder Stretches Upper trap stretch      Modalities   Modalities Vasopneumatic      Vasopneumatic   Number Minutes Vasopneumatic  10 minutes    Vasopnuematic Location  Shoulder    Vasopneumatic Pressure Low    Vasopneumatic Temperature  34      Manual Therapy   Manual Therapy Joint mobilization;Soft tissue mobilization;Passive ROM    Manual therapy comments supine    Joint Mobilization gentle post/inf glides, gentle distaction    Soft tissue mobilization STM UT, LS, pecs    Passive ROM R shoulder  all planes to tolerance             PT Short Term Goals - 02/03/21 1341      PT SHORT TERM GOAL #1   Title Independent with initial HEP    Status Achieved             PT Long Term Goals - 01/30/21 1221      PT LONG TERM GOAL #1   Title Independent with advanced HEP    Time 8    Period Weeks    Status New    Target Date 01/30/21      PT LONG TERM GOAL #2   Title Pt will be able to return to performing some household chores and grooming utilizing R arm    Baseline unable to do household chores at eval    Time 8    Period Weeks    Status New    Target Date 03/27/21      PT LONG TERM GOAL #3   Title Pt will demo R shoulder ROM symmetrical to left side    Time 8    Period Weeks    Status New    Target Date 03/27/21      PT LONG TERM GOAL #4   Title Grip strength on the right to be greater than or equal to left to facilitate return to job duties, grasp with functional activities.    Time 8    Period Weeks    Status New    Target Date 03/27/21                 Plan - 02/10/21 1014    Clinical Impression Statement Pt continues to exhibit significant guarding and protective positioning of shoulder. She did tolerate session well with gentle manual therapy, as well as retractions in sitting/standing for positioning. Reinforced initial home exercise program with passive pendulums versus AROM to avoid pain and maintain precautions. Trialed table slide flexion stretch, passively placing arm on high mat table and not putting weight through it, as she used her body to increase the stretch with fair tolerance today,table slide ROM limited by pain. Manual tx with emphasis on STM to UT and pecs on the right with multiple tender areas.Educated patient further on avoiding push into any painful motions.    Rehab Potential Good    PT Frequency 2x / week    PT Duration 8 weeks    PT Treatment/Interventions Vasopneumatic Device;Taping;Electrical Stimulation;Cryotherapy;ADLs/Self  Care Home Management;Iontophoresis 4mg /ml Dexamethasone;Moist Heat;Neuromuscular re-education;Therapeutic exercise;Therapeutic activities;Functional mobility training;Patient/family education;Manual techniques;Scar mobilization    PT Next Visit Plan Slowly progress PROM/AAROM per protocol  madalities as needed    PT Home Exercise Plan HEP access code [email or web address]    [email or web address] and Agree with Plan of Care Patient  Patient will benefit from skilled therapeutic intervention in order to improve the following deficits and impairments:  Decreased range of motion,Increased fascial restricitons,Impaired UE functional use,Pain,Improper body mechanics,Impaired flexibility,Hypomobility,Decreased balance,Decreased mobility,Decreased strength,Increased edema,Postural dysfunction  Visit Diagnosis: Acute pain of right shoulder  Stiffness of right shoulder, not elsewhere classified  Cervicalgia  Chronic left shoulder pain  Stiffness of left shoulder, not elsewhere classified  Muscle weakness (generalized)  Abnormal posture     Problem List Patient Active Problem List   Diagnosis Date Noted  . Stroke ([place])   . Thyroid disease   . Hypothyroidism   . Preop cardiovascular exam 11/25/2020  . Pulmonary hypertension, unspecified ([place]) 11/25/2020  . Prediabetes   . Hyperlipidemia   . 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) tear of right shoulder 09/30/2020  . Rotator cuff impingement syndrome of right shoulder 09/30/2020  . 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 Aneurysm ([place]) 03/18/2020  . History of TIA (transient ischemic attack) 02/14/2020  . COVID-19 virus infection 02/14/2020  . TIA (transient ischemic attack) 04/15/2018  . History of stroke   . Mixed hyperlipidemia   . Essential hypertension   . Impingement syndrome of right shoulder 12/24/2016  . Tobacco use 09/28/2016  . Glenohumeral arthritis 12/27/2014   . 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    Hall Busing , PT, DPT 02/10/2021, 10:17 AM  [place]. [place], [place], [address] Phone: [phone]   Fax:  [phone]  Name: VEYDA KAUFMAN MRN: [id number] Date of Birth: 28-Aug-1966

## 2021-02-12 ENCOUNTER — Telehealth: Payer: Self-pay

## 2021-02-12 ENCOUNTER — Other Ambulatory Visit: Payer: Self-pay

## 2021-02-12 ENCOUNTER — Ambulatory Visit: Payer: Commercial Managed Care - PPO

## 2021-02-12 DIAGNOSIS — M25511 Pain in right shoulder: Secondary | ICD-10-CM | POA: Diagnosis not present

## 2021-02-12 DIAGNOSIS — M542 Cervicalgia: Secondary | ICD-10-CM

## 2021-02-12 DIAGNOSIS — M6281 Muscle weakness (generalized): Secondary | ICD-10-CM

## 2021-02-12 DIAGNOSIS — G8929 Other chronic pain: Secondary | ICD-10-CM

## 2021-02-12 DIAGNOSIS — R293 Abnormal posture: Secondary | ICD-10-CM

## 2021-02-12 DIAGNOSIS — M25612 Stiffness of left shoulder, not elsewhere classified: Secondary | ICD-10-CM

## 2021-02-12 DIAGNOSIS — M25611 Stiffness of right shoulder, not elsewhere classified: Secondary | ICD-10-CM

## 2021-02-12 NOTE — Telephone Encounter (Signed)
Pt called into the office stating that she is still in extreme pain  And would like a call back

## 2021-02-12 NOTE — Telephone Encounter (Signed)
Can yall send something into pharm.

## 2021-02-12 NOTE — Therapy (Signed)
[place]. [place], [place], [address] Phone: [phone]   Fax:  [phone]  Physical Therapy Treatment  Patient Details  Name: Meagan Flores MRN: [id number] Date of Birth: 10-19-1966 Referring Provider (PT): Meagan Melena PA-C for Dr. Erlinda Flores   Encounter Date: 02/12/2021   PT End of Session - 02/12/21 0814    Visit Number 5    Number of Visits 17    Date for PT Re-Evaluation 03/27/21    PT Start Time 0801    PT Stop Time 5102    PT Time Calculation (min) 46 min    Activity Tolerance Patient tolerated treatment well;Patient limited by pain    Behavior During Therapy [facility] for tasks assessed/performed           Past Medical History:  Diagnosis Date  . Aneurysm ([place]) 03/18/2020  . 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 COVID-19 virus infection 02/14/2020  . 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) tear of right shoulder 09/30/2020  . Essential hypertension   . Glenohumeral arthritis 12/27/2014  . History of stroke   . History of TIA (transient ischemic attack) 02/14/2020  . Hyperlipidemia   . Hypothyroidism   . Impingement syndrome of right shoulder 12/24/2016  . Mixed hyperlipidemia   . 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 Prediabetes   . Preop cardiovascular exam 11/25/2020  . Pulmonary hypertension, unspecified ([place]) 11/25/2020  . 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 Rotator cuff impingement syndrome of right shoulder 09/30/2020  . Stroke ([place])   . Tendinopathy of right biceps tendon 09/30/2020  . Thyroid disease   . TIA (transient ischemic attack) 04/15/2018  . Tobacco use 09/28/2016    Past Surgical History:  Procedure Laterality Date  . PARTIAL HYSTERECTOMY    . right arm sx    . SHOULDER ARTHROSCOPY WITH BICEPSTENOTOMY Right 01/21/2021   Procedure: SHOULDER ARTHROSCOPY WITH BICEPSTENOTOMY;  Surgeon: Leandrew Koyanagi, MD;  Location: [facility];  Service:  Orthopedics;  Laterality: Right;  . SHOULDER ARTHROSCOPY WITH DISTAL CLAVICLE RESECTION Right 01/12/2017   Procedure: RIGHT SHOULDER ARTHROSCOPY WITH  SUBACROMIAL DECOMPRESSION, DISTAL CLAVICLE EXCISION, extensive debridement;  Surgeon: Leandrew Koyanagi, MD;  Location: [facility];  Service: Orthopedics;  Laterality: Right;  . THYMECTOMY  2004  . THYROIDECTOMY      There were no vitals filed for this visit.   Subjective Assessment - 02/12/21 0802    Subjective Has continued to have alot of pain, reports not having a follow up scheduled with Dr Meagan Flores until April 6th    Pertinent History History of surgery on R shoulder x2  and elbow surgery x2.    Patient Stated Goals decreae pain, get back to PLOF    Currently in Pain? Yes    Pain Score 8     Pain Location Shoulder    Pain Orientation Right    Pain Descriptors / Indicators [name] Adult PT Treatment/Exercise - 02/12/21 0805      Exercises   Exercises Elbow;Wrist;Hand;Neck      Shoulder Exercises: Pulleys   Flexion 3 minutes   within comfortable range     Hand Exercises   Other Hand Exercises twel roll gripping 5" x 10      Wrist Exercises   Wrist Flexion Strengthening;Right;10 reps;Seated;Bar weights/barbell    Bar Weights/Barbell (Wrist Flexion) 1 lb    Wrist Extension Strengthening;Right;10 reps;Seated;Bar weights/barbell  Bar Weights/Barbell (Wrist Extension) 1 lb    Wrist Radial Deviation Strengthening;Right;10 reps;Seated;Bar weights/barbell    Bar Weights/Barbell (Radial Deviation) 1 lb      Manual Therapy   Manual Therapy Joint mobilization;Soft tissue mobilization;Passive ROM    Joint Mobilization gentle post/inf glides, gentle distaction    Soft tissue mobilization STM UT, LS, pecs    Passive ROM R shoulder all planes to tolerance- very limited by muscle guarding and spasms in all planes, moreso for abduction      Neck Exercises: Stretches   Upper Trapezius Stretch 3 reps;20  seconds;Right;Left             PT Short Term Goals - 02/03/21 1341      PT SHORT TERM GOAL #1   Title Independent with initial HEP    Status Achieved             PT Long Term Goals - 01/30/21 1221      PT LONG TERM GOAL #1   Title Independent with advanced HEP    Time 8    Period Weeks    Status New    Target Date 01/30/21      PT LONG TERM GOAL #2   Title Pt will be able to return to performing some household chores and grooming utilizing R arm    Baseline unable to do household chores at eval    Time 8    Period Weeks    Status New    Target Date 03/27/21      PT LONG TERM GOAL #3   Title Pt will demo R shoulder ROM symmetrical to left side    Time 8    Period Weeks    Status New    Target Date 03/27/21      PT LONG TERM GOAL #4   Title Grip strength on the right to be greater than or equal to left to facilitate return to job duties, grasp with functional activities.    Time 8    Period Weeks    Status New    Target Date 03/27/21                 Plan - 02/12/21 0815    Clinical Impression Statement Meagan Flores continues to have alot of muscle guarding and pain in the R shoulder. Introduced some distal hand/wrist strengthening with the arm supported to address distal arm and grip weakness with fair tolerance, fatiguing quickly requiring rest breaks. Manual therapy tolerated fairly within pain limited ranges but she has significant mms guarding and spasms which are increasing pain even with attempts of just passive ROM. Educated pt on use of ice, pendulums at home to assist in pain mgmt but recommended that if intense pain persists and does not decrease she should follow up with Surgeons office    Rehab Potential Good    PT Frequency 2x / week    PT Duration 8 weeks    PT Treatment/Interventions Vasopneumatic Device;Taping;Electrical Stimulation;Cryotherapy;ADLs/Self Care Home Management;Iontophoresis 4mg /ml Dexamethasone;Moist Heat;Neuromuscular  re-education;Therapeutic exercise;Therapeutic activities;Functional mobility training;Patient/family education;Manual techniques;Scar mobilization    PT Next Visit Plan Slowly progress PROM/AAROM per protocol  madalities as needed    Consulted and Agree with Plan of Care Patient           Patient will benefit from skilled therapeutic intervention in order to improve the following deficits and impairments:  Decreased range of motion,Increased fascial restricitons,Impaired UE functional use,Pain,Improper body mechanics,Impaired flexibility,Hypomobility,Decreased balance,Decreased mobility,Decreased strength,Increased edema,Postural dysfunction  Visit Diagnosis: Acute pain of right shoulder  Stiffness of right shoulder, not elsewhere classified  Cervicalgia  Chronic left shoulder pain  Stiffness of left shoulder, not elsewhere classified  Muscle weakness (generalized)  Abnormal posture     Problem List Patient Active Problem List   Diagnosis Date Noted  . Stroke ([place])   . Thyroid disease   . Hypothyroidism   . Preop cardiovascular exam 11/25/2020  . Pulmonary hypertension, unspecified ([place]) 11/25/2020  . Prediabetes   . Hyperlipidemia   . 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) tear of right shoulder 09/30/2020  . Rotator cuff impingement syndrome of right shoulder 09/30/2020  . 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 Aneurysm ([place]) 03/18/2020  . History of TIA (transient ischemic attack) 02/14/2020  . COVID-19 virus infection 02/14/2020  . TIA (transient ischemic attack) 04/15/2018  . History of stroke   . Mixed hyperlipidemia   . Essential hypertension   . Impingement syndrome of right shoulder 12/24/2016  . Tobacco use 09/28/2016  . Glenohumeral arthritis 12/27/2014  . 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    Hall Busing, PT, DPT 02/12/2021, 8:45 AM  [place]. [place], [place], [address] Phone: [phone]   Fax:  [phone]  Name: Meagan Flores MRN: [id number] Date of Birth: [birth year]

## 2021-02-13 MED ORDER — HYDROCODONE-ACETAMINOPHEN 5-325 MG PO TABS: 1.0000 | ORAL_TABLET | Freq: Every day | ORAL | 0 refills | Status: DC | PRN

## 2021-02-13 NOTE — Addendum Note (Signed)
Addended by: Azucena Cecil on: 02/13/2021 09:10 PM   Modules accepted: Orders

## 2021-02-13 NOTE — Telephone Encounter (Signed)
I sent norco

## 2021-02-16 NOTE — Telephone Encounter (Signed)
Patient aware.

## 2021-02-16 NOTE — Telephone Encounter (Signed)
Ok, thanks.

## 2021-02-17 ENCOUNTER — Ambulatory Visit
Admission: RE | Admit: 2021-02-17 | Discharge: 2021-02-17 | Disposition: A | Discharge status: Home / Self Care | Payer: Commercial Managed Care - PPO | Source: Ambulatory Visit | Attending: Critical Care Medicine | Admitting: Critical Care Medicine

## 2021-02-17 ENCOUNTER — Other Ambulatory Visit: Payer: Self-pay

## 2021-02-17 ENCOUNTER — Ambulatory Visit: Payer: Commercial Managed Care - PPO | Attending: Critical Care Medicine | Admitting: Critical Care Medicine

## 2021-02-17 ENCOUNTER — Encounter: Payer: Self-pay | Admitting: Critical Care Medicine

## 2021-02-17 VITALS — BP 124/83 | HR 73 | Resp 16 | Wt 202.2 lb

## 2021-02-17 DIAGNOSIS — Z720 Tobacco use: Secondary | ICD-10-CM | POA: Diagnosis not present

## 2021-02-17 DIAGNOSIS — E890 Postprocedural hypothyroidism: Secondary | ICD-10-CM

## 2021-02-17 DIAGNOSIS — M25512 Pain in left shoulder: Secondary | ICD-10-CM | POA: Diagnosis not present

## 2021-02-17 DIAGNOSIS — L309 Dermatitis, unspecified: Secondary | ICD-10-CM | POA: Insufficient documentation

## 2021-02-17 DIAGNOSIS — R21 Rash and other nonspecific skin eruption: Secondary | ICD-10-CM

## 2021-02-17 HISTORY — DX: Rash and other nonspecific skin eruption: R21

## 2021-02-17 MED ORDER — CLOBETASOL PROPIONATE 0.05 % EX CREA: 1.0000 "application " | TOPICAL_CREAM | Freq: Two times a day (BID) | CUTANEOUS | 0 refills | Status: DC

## 2021-02-17 MED ORDER — HYDROCODONE-ACETAMINOPHEN 5-325 MG PO TABS: 1.0000 | ORAL_TABLET | Freq: Every day | ORAL | 0 refills | Status: DC | PRN

## 2021-02-17 NOTE — Assessment & Plan Note (Signed)
Postoperative hypothyroidism stable at this time will check thyroid function continue thyroid replacement

## 2021-02-17 NOTE — Patient Instructions (Signed)
Return to see Dr. Joya Gaskins in 2 months No labs ordered today Referral sent to get a plain X-ray of the left shoulder and if abnormal we will do a  MRI  Discontinue Valsartan for now and continue to keep working on tobacco use and a healthy diet  Refills on pain medicine sent to your pharmacy We will notify Dr. Erlinda Hong of the left shoulder pain before you see him for your appointment April 6  DASH Eating Plan DASH stands for Dietary Approaches to Stop Hypertension. The DASH eating plan is a healthy eating plan that has been shown to:  Reduce high blood pressure (hypertension).  Reduce your risk for type 2 diabetes, heart disease, and stroke.  Help with weight loss. What are tips for following this plan? Reading food labels  Check food labels for the amount of salt (sodium) per serving. Choose foods with less than 5 percent of the Daily Value of sodium. Generally, foods with less than 300 milligrams (mg) of sodium per serving fit into this eating plan.  To find whole grains, look for the word "whole" as the first word in the ingredient list. Shopping  Buy products labeled as "low-sodium" or "no salt added."  Buy fresh foods. Avoid canned foods and pre-made or frozen meals. Cooking  Avoid adding salt when cooking. Use salt-free seasonings or herbs instead of table salt or sea salt. Check with your health care provider or pharmacist before using salt substitutes.  Do not fry foods. Cook foods using healthy methods such as baking, boiling, grilling, roasting, and broiling instead.  Cook with heart-healthy oils, such as olive, canola, avocado, soybean, or sunflower oil. Meal planning  Eat a balanced diet that includes: ? 4 or more servings of fruits and 4 or more servings of vegetables each day. Try to fill one-half of your plate with fruits and vegetables. ? 6-8 servings of whole grains each day. ? Less than 6 oz (170 g) of lean meat, poultry, or fish each day. A 3-oz (85-g) serving of meat  is about the same size as a deck of cards. One egg equals 1 oz (28 g). ? 2-3 servings of low-fat dairy each day. One serving is 1 cup (237 mL). ? 1 serving of nuts, seeds, or beans 5 times each week. ? 2-3 servings of heart-healthy fats. Healthy fats called omega-3 fatty acids are found in foods such as walnuts, flaxseeds, fortified milks, and eggs. These fats are also found in cold-water fish, such as sardines, salmon, and mackerel.  Limit how much you eat of: ? Canned or prepackaged foods. ? Food that is high in trans fat, such as some fried foods. ? Food that is high in saturated fat, such as fatty meat. ? Desserts and other sweets, sugary drinks, and other foods with added sugar. ? Full-fat dairy products.  Do not salt foods before eating.  Do not eat more than 4 egg yolks a week.  Try to eat at least 2 vegetarian meals a week.  Eat more home-cooked food and less restaurant, buffet, and fast food.   Lifestyle  When eating at a restaurant, ask that your food be prepared with less salt or no salt, if possible.  If you drink alcohol: ? Limit how much you use to:  0-1 drink a day for women who are not pregnant.  0-2 drinks a day for men. ? Be aware of how much alcohol is in your drink. In the [place] one drink equals one 12 oz bottle  of beer (355 mL), one 5 oz glass of wine (148 mL), or one 1 oz glass of hard liquor (44 mL). General information  Avoid eating more than 2,300 mg of salt a day. If you have hypertension, you may need to reduce your sodium intake to 1,500 mg a day.  Work with your health care provider to maintain a healthy body weight or to lose weight. Ask what an ideal weight is for you.  Get at least 30 minutes of exercise that causes your heart to beat faster (aerobic exercise) most days of the week. Activities may include walking, swimming, or biking.  Work with your health care provider or dietitian to adjust your eating plan to your individual calorie  needs. What foods should I eat? Fruits All fresh, dried, or frozen fruit. Canned fruit in natural juice (without added sugar). Vegetables Fresh or frozen vegetables (raw, steamed, roasted, or grilled). Low-sodium or reduced-sodium tomato and vegetable juice. Low-sodium or reduced-sodium tomato sauce and tomato paste. Low-sodium or reduced-sodium canned vegetables. Grains Whole-grain or whole-wheat bread. Whole-grain or whole-wheat pasta. Brown rice. [place]. Bulgur. Whole-grain and low-sodium cereals. Pita bread. Low-fat, low-sodium crackers. Whole-wheat flour tortillas. Meats and other proteins Skinless chicken or [place]. Ground chicken or [place]. Pork with fat trimmed off. Fish and seafood. Egg whites. Dried beans, peas, or lentils. Unsalted nuts, nut butters, and seeds. Unsalted canned beans. Lean cuts of beef with fat trimmed off. Low-sodium, lean precooked or cured meat, such as sausages or meat loaves. Dairy Low-fat (1%) or fat-free (skim) milk. Reduced-fat, low-fat, or fat-free cheeses. Nonfat, low-sodium ricotta or cottage cheese. Low-fat or nonfat yogurt. Low-fat, low-sodium cheese. Fats and oils Soft margarine without trans fats. Vegetable oil. Reduced-fat, low-fat, or light mayonnaise and salad dressings (reduced-sodium). Canola, safflower, olive, avocado, soybean, and sunflower oils. Avocado. Seasonings and condiments Herbs. Spices. Seasoning mixes without salt. Other foods Unsalted popcorn and pretzels. Fat-free sweets. The items listed above may not be a complete list of foods and beverages you can eat. Contact a dietitian for more information. What foods should I avoid? Fruits Canned fruit in a light or heavy syrup. Fried fruit. Fruit in cream or butter sauce. Vegetables Creamed or fried vegetables. Vegetables in a cheese sauce. Regular canned vegetables (not low-sodium or reduced-sodium). Regular canned tomato sauce and paste (not low-sodium or reduced-sodium). Regular  tomato and vegetable juice (not low-sodium or reduced-sodium). [name] Fava. Olives. Grains Baked goods made with fat, such as croissants, muffins, or some breads. Dry pasta or rice meal packs. Meats and other proteins Fatty cuts of meat. Ribs. Fried meat. Berniece Salines. Bologna, salami, and other precooked or cured meats, such as sausages or meat loaves. Fat from the back of a pig (fatback). Bratwurst. Salted nuts and seeds. Canned beans with added salt. Canned or smoked fish. Whole eggs or egg yolks. Chicken or [place] with skin. Dairy Whole or 2% milk, cream, and half-and-half. Whole or full-fat cream cheese. Whole-fat or sweetened yogurt. Full-fat cheese. Nondairy creamers. Whipped toppings. Processed cheese and cheese spreads. Fats and oils Butter. Stick margarine. Lard. Shortening. Ghee. Bacon fat. Tropical oils, such as coconut, palm kernel, or palm oil. Seasonings and condiments Onion salt, garlic salt, seasoned salt, table salt, and sea salt. Worcestershire sauce. Tartar sauce. Barbecue sauce. Teriyaki sauce. Soy sauce, including reduced-sodium. Steak sauce. Canned and packaged gravies. Fish sauce. Oyster sauce. Cocktail sauce. Store-bought horseradish. Ketchup. Mustard. Meat flavorings and tenderizers. Bouillon cubes. Hot sauces. Pre-made or packaged marinades. Pre-made or packaged taco seasonings. Relishes. Regular salad  dressings. Other foods Salted popcorn and pretzels. The items listed above may not be a complete list of foods and beverages you should avoid. Contact a dietitian for more information. Where to find more information  National Heart, Lung, and Blood Institute: https://wilson-eaton.com/  American Heart Association: www.heart.org  Academy of Nutrition and Dietetics: www.eatright.[place]: www.kidney.org Summary  The DASH eating plan is a healthy eating plan that has been shown to reduce high blood pressure (hypertension). It may also reduce your risk for type 2  diabetes, heart disease, and stroke.  When on the DASH eating plan, aim to eat more fresh fruits and vegetables, whole grains, lean proteins, low-fat dairy, and heart-healthy fats.  With the DASH eating plan, you should limit salt (sodium) intake to 2,300 mg a day. If you have hypertension, you may need to reduce your sodium intake to 1,500 mg a day.  Work with your health care provider or dietitian to adjust your eating plan to your individual calorie needs. This information is not intended to replace advice given to you by your health care provider. Make sure you discuss any questions you have with your health care provider. Document Revised: 10/19/2019 Document Reviewed: 10/19/2019 Elsevier Patient Education  2021 [place].    Smoking Tobacco Information, Adult Smoking tobacco can be harmful to your health. Tobacco contains a poisonous (toxic), colorless chemical called nicotine. Nicotine is addictive. It changes the brain and can make it hard to stop smoking. Tobacco also has other toxic chemicals that can hurt your body and raise your risk of many cancers. How can smoking tobacco affect me? Smoking tobacco puts you at risk for:  Cancer. Smoking is most commonly associated with lung cancer, but can also lead to cancer in other parts of the body.  Chronic obstructive pulmonary disease (COPD). This is a long-term lung condition that makes it hard to breathe. It also gets worse over time.  High blood pressure (hypertension), heart disease, stroke, or heart attack.  Lung infections, such as pneumonia.  Cataracts. This is when the lenses in the eyes become clouded.  Digestive problems. This may include peptic ulcers, heartburn, and gastroesophageal reflux disease (GERD).  Oral health problems, such as gum disease and tooth loss.  Loss of taste and smell. Smoking can affect your appearance by causing:  Wrinkles.  Yellow or stained teeth, fingers, and fingernails. Smoking  tobacco can also affect your social life, because:  It may be challenging to find places to smoke when away from home. Many workplaces, [employer], hotels, and public places are tobacco-free.  Smoking is expensive. This is due to the cost of tobacco and the long-term costs of treating health problems from smoking.  Secondhand smoke may affect those around you. Secondhand smoke can cause lung cancer, breathing problems, and heart disease. Children of smokers have a higher risk for: ? Sudden infant death syndrome (SIDS). ? Ear infections. ? Lung infections. If you currently smoke tobacco, quitting now can help you:  Lead a longer and healthier life.  Look, smell, breathe, and feel better over time.  Save money.  Protect others from the harms of secondhand smoke. What actions can I take to prevent health problems? Quit smoking  Do not start smoking. Quit if you already do.  Make a plan to quit smoking and commit to it. Look for programs to help you and ask your health care provider for recommendations and ideas.  Set a date and write down all the reasons you want  to quit.  Let your friends and family know you are quitting so they can help and support you. Consider finding friends who also want to quit. It can be easier to quit with someone else, so that you can support each other.  Talk with your health care provider about using nicotine replacement medicines to help you quit, such as gum, lozenges, patches, sprays, or pills.  Do not replace cigarette smoking with electronic cigarettes, which are commonly called e-cigarettes. The safety of e-cigarettes is not known, and some may contain harmful chemicals.  If you try to quit but return to smoking, stay positive. It is common to slip up when you first quit, so take it one day at a time.  Be prepared for cravings. When you feel the urge to smoke, chew gum or suck on hard candy.   Lifestyle  Stay busy and take care of your  body.  Drink enough fluid to keep your urine pale yellow.  Get plenty of exercise and eat a healthy diet. This can help prevent weight gain after quitting.  Monitor your eating habits. Quitting smoking can cause you to have a larger appetite than when you smoke.  Find ways to relax. Go out with friends or family to a movie or a restaurant where people do not smoke.  Ask your health care provider about having regular tests (screenings) to check for cancer. This may include blood tests, imaging tests, and other tests.  Find ways to manage your stress, such as meditation, yoga, or exercise. Where to find support To get support to quit smoking, consider:  Asking your health care provider for more information and resources.  Taking classes to learn more about quitting smoking.  Looking for local organizations that offer resources about quitting smoking.  Joining a support group for people who want to quit smoking in your local community.  Calling the smokefree.gov counselor helpline: 1-800-Quit-Now 364-729-5037) Where to find more information You may find more information about quitting smoking from:  HelpGuide.org: www.helpguide.org  https://hall.com/: smokefree.gov  American Lung Association: www.lung.org Contact a health care provider if you:  Have problems breathing.  Notice that your lips, nose, or fingers turn blue.  Have chest pain.  Are coughing up blood.  Feel faint or you pass out.  Have other health changes that cause you to worry. Summary  Smoking tobacco can negatively affect your health, the health of those around you, your finances, and your social life.  Do not start smoking. Quit if you already do. If you need help quitting, ask your health care provider.  Think about joining a support group for people who want to quit smoking in your local community. There are many effective programs that will help you to quit this behavior. This information is not  intended to replace advice given to you by your health care provider. Make sure you discuss any questions you have with your health care provider. Document Revised: 08/10/2019 Document Reviewed: 11/30/2016 Elsevier Patient Education  2021 [employer].

## 2021-02-17 NOTE — Assessment & Plan Note (Signed)
Prior history of right shoulder pain with surgery for rotator cuff impingement now with new onset pain in the left shoulder with decreased range of motion  Plan x-rays of the left shoulder and will alert orthopedics

## 2021-02-17 NOTE — Assessment & Plan Note (Signed)
Skin rash over the left forearm secondary to eczema will give Temovate cream

## 2021-02-17 NOTE — Assessment & Plan Note (Signed)
  .   Current smoking consumption amount: Down to 2-4 cigarettes daily  . Dicsussion on advise to quit smoking and smoking impacts: Importance of quitting smoking due to impacts on cardiovascular health and stroke recurrence  . Patient's willingness to quit: Is willing to quit  . Methods to quit smoking discussed: We discussed behavioral modification the use of sertraline to help with her anxiety and depression and nicotine replacement therapy with lozenge 4 mg 3 times daily as needed  . Medication management of smoking session drugs discussed: I have prescribed nicotine replacement therapy  . Resources provided:  AVS   . Setting quit date date not set  . Follow-up arranged follow-up OV 4 months   Time spent counseling the patient: 5 minutes

## 2021-02-17 NOTE — Assessment & Plan Note (Deleted)
Severe depression over loss of her mother who was murdered 4 months ago  Patient has elements of PTSD  Referral to psychiatry was made

## 2021-02-17 NOTE — Progress Notes (Addendum)
Subjective:    Patient ID: Meagan Flores, female    DOB: 12-Aug-1966, 55 y.o.   MRN: [id number]  History of Present Illness: 02/14/20 This is a 55 year old female who was in the emergency department on 9 March with symptoms consistent with viral infection.  The patient initially came in with general malaise fever muscle aches and pain headaches and dizziness loss of taste and smell Covid test was positive the patient was not hypoxic chest x-ray did not show infiltrates patient was given supportive care fluid hydration and then discharged later in the day.  This is a post ER follow-up visit and also to establish in the clinic as the patient does not have a primary care provider.  This patient states she is improving over the last 24 hours.  Previously over the last several days she had episodes of diarrhea nausea and vomiting without abdominal pain.  She still lost her sense of taste.  She states that she has had cough is paroxysmal but her shortness of breath is resolved urine output is adequate urine is not concentrated.  She states her muscle aches and pains are resolving she still has some pain in the back and hurts some in the legs she also still has some fatigue.  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  Former smoker now only smoking the occasional amounts.  Previous history of stroke and had been on aspirin 81 mg daily and lipid therapy.  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  The only active medicine she has at this time includes over-the-counter zinc and vitamin C and she also has the Synthroid 125 mcg daily.  4/20 This patient returns today in follow-up after the first visit being a telehealth visit.  The patient did have Covid viral infection and is rapidly improved from this.  She still has some fatigue.  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  She is smoking now pack of cigarettes every 3 days.  Note the patient comes in today with adequate blood pressure and not requiring blood pressure medications.  The patient is on Synthroid 125 mcg daily without symptoms of hypothyroidism at this time.  Also is on aspirin and atorvastatin for stroke prevention  This patient has suffered lost during the pandemic and that her son was murdered in January she lost both her parents several months apart late in 2020 she is suffering grief reaction from these losses  04/15/2020 Patient seen in return follow-up and has had complete resolution of the COVID-19 viral infection.  The patient has not yet received the Covid vaccine and is giving this consideration.  Patient states her biggest issue now is right arm and elbow pain.  Her right dominant hand is numb in the ulnar distribution.  She had a cubital fossa surgery impingement release previously by a hand surgeon several years ago in fact almost 10 years ago.  On this occurred on the right elbow and also she has had impingement syndrome of the acromioclavicular joint with surgery arthroscopy done in 2018 by orthopedics locally.  She states her arm is hurting worse now.  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  Patient continues to smoke about a pack every 3 days.  He denies any shortness of breath cough or other  respiratory complaints.  She is wishing to have referral back to orthopedics.  Note at the last visit her thyroid function was normal and she is on Synthroid replacement.  Blood pressure at this visit is well controlled at 117/80.  07/29/2020 The patient is seen in return follow-up for hypertension, glenohumeral arthritis of the right shoulder, tobacco use.  Patient states in the interim she is still smoking 1 pack every 4 days of cigarettes.  She has difficulty falling asleep and will sleep late into the morning.  She is noted increased headaches  and palpitations.  She pulled a muscle in her left upper chest recently.  She never received the nicotine replacement therapy rep we recommended.  She is due a mammogram.  She notes increased stress anxiety and dysphoria.  She is not been on antidepressants before and is interested in receiving this.  She notes her right shoulder is still having some pain to take with movement.  She uses Tylenol for this and this seems to help.  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  Note her PHQ 9 score is very elevated at this visit.  09/10/2020 This is a telephone visit for this 55 year old female seen in follow-up after had previously been seen in August of this year.  The patient states she is having difficulty with her thyroid medication.  She states since being on the 125 mcg of the Synthroid she is feeling more jittery.  She has lost weight not intentionally.  She has lost her appetite.  She feels her heart is racing.  She is down to 7 to 8 cigarettes daily.  She still has right shoulder pain which is chronic.  She has a history of acromioclavicular joint impingement syndrome and continues to try to work under these conditions.  Her anxiety appears to be unchanged despite being on sertraline 50 mg daily.  10/21/2020 Patient is seen in return follow-up visit history of postoperative hypothyroidism we reduced her dose to 7 thyroid 100 mcg daily at the last visit she is now here for follow-up labs  Also history of glenohumeral arthritis with nerve impingement syndrome and tendinosis of the biceps tendon had she has been seen by orthopedics and she will need to undergo orthoscopic surgery of the shoulder per Dr. 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  She has a cardiology visit today for this.  She denies any significant hypersomnolence or snoring.  Patient is also yet to receive her mammogram.  She also is due a Covid booster.  Patient denies any other symptoms at this time.  Her ankle is improving and she just got out of the ankle boot.  She is still smoking 2 to 4 cigarettes daily.     02/17/2021 Patient seen in return follow-up hypertension is well controlled on arrival blood pressure 124/83.  She does state however she had side effects from the valsartan and had to stop the medication she is try to follow good diet and exercise  Patient complains of left shoulder pain that is new in  onset she has previously had surgery on the right shoulder with MRIs  Patient's smoking is down to 1 pack every 4 days    Past Medical History:  Diagnosis Date  . Aneurysm ([place]) 03/18/2020  . 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 COVID-19 virus infection 02/14/2020  . 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) tear of right shoulder 09/30/2020  . Essential hypertension   . Glenohumeral arthritis 12/27/2014  . History of stroke   . History of TIA (transient ischemic attack) 02/14/2020  . Hyperlipidemia   . Hypothyroidism   . Impingement syndrome of right shoulder 12/24/2016  . Mixed hyperlipidemia   . 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 Prediabetes   . Preop cardiovascular exam 11/25/2020  . Pulmonary hypertension, unspecified ([place]) 11/25/2020  . 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 Rotator cuff impingement syndrome of right shoulder 09/30/2020  . Stroke ([place])   . Tendinopathy of right biceps tendon 09/30/2020  . Thyroid disease   . TIA (transient ischemic attack) 04/15/2018  . Tobacco use 09/28/2016     Family History  Problem Relation Age of Onset  .  Stomach cancer Sister   . Colon polyps Sister   . Esophageal cancer Neg Hx   . Rectal cancer Neg Hx   . Colon cancer Neg Hx   . Breast cancer Neg Hx      Social History   Socioeconomic History  . Marital status: Married    Spouse name: Not on file  . Number of children: Not on file  . Years of education: Not on file  . Highest education level: Not on file  Occupational History  . Not on file  Tobacco Use  . Smoking status: Current Every Day Smoker    Packs/day: 0.25    Years: 34.00    Pack years: 8.50  . Smokeless tobacco: Never Used  Vaping Use  . Vaping Use: Never used  Substance and Sexual Activity  . Alcohol use: Yes    Comment: occassional  . Drug use: No  . Sexual activity: Not on file  Other Topics Concern  . Not on file  Social History Narrative  . Not on file   Social Determinants of Health   Financial Resource Strain: Not on file  Food Insecurity: Not on file  Transportation Needs: Not on file  Physical Activity: Not on file  Stress: Not on file  Social Connections: Not on file  Intimate Partner Violence: Not on file     Allergies  Allergen Reactions  . Sulfa Antibiotics Hives     Outpatient Medications Prior to Visit  Medication Sig Dispense Refill  . aspirin EC 81 MG tablet Take 1 tablet (81 mg total) by mouth daily. 60 tablet 3  . atorvastatin (LIPITOR) 40 MG tablet Take 1 tablet (40 mg total) by mouth daily at 6 PM. 90 tablet 1  . EUTHYROX 50 MCG tablet Take 2 tablets by mouth once daily 60 tablet 0  . HYDROcodone-acetaminophen (NORCO) 5-325 MG tablet Take 1-2 tablets by mouth daily as needed. 20 tablet 0  . acetaminophen (TYLENOL) 325 MG tablet Take 2 tablets (650 mg total) by mouth every 6 (six) hours as needed for up to 30 doses for mild pain or moderate pain. (Patient not taking: Reported on 02/17/2021) 30 tablet 0  . ibuprofen (ADVIL) 600 MG tablet Take 1 tablet (600 mg total) by mouth every 6 (six) hours as needed for up to 30 doses for  mild pain or moderate pain. (Patient not taking:  Reported on 02/17/2021) 30 tablet 0  . ondansetron (ZOFRAN) 4 MG tablet Take 1-2 tablets (4-8 mg total) by mouth every 8 (eight) hours as needed for nausea or vomiting. (Patient not taking: Reported on 02/17/2021) 20 tablet 0  . oxyCODONE-acetaminophen (PERCOCET) 5-325 MG tablet Take 1-2 tablets by mouth every 8 (eight) hours as needed for severe pain. (Patient not taking: Reported on 02/17/2021) 30 tablet 0  . valsartan (DIOVAN) 160 MG tablet Take 1 tablet (160 mg total) by mouth daily. (Patient not taking: Reported on 02/17/2021) 90 tablet 1   No facility-administered medications prior to visit.      Review of Systems  Cardiovascular: Negative for chest pain and palpitations.  Neurological: Negative for tremors, facial asymmetry, speech difficulty, weakness, light-headedness, numbness and headaches.  Psychiatric/Behavioral: Negative for decreased concentration, dysphoric mood, self-injury, sleep disturbance and suicidal ideas. The patient is not nervous/anxious and is not hyperactive.         Objective:   Physical Exam  Vitals:   02/17/21 0921  BP: 124/83  Pulse: 73  Resp: 16  SpO2: 97%  Weight: 202 lb 3.2 oz (91.7 kg)    Gen: Pleasant, well-nourished, in no distress,  normal affect  ENT: No lesions,  mouth clear,  oropharynx clear, no postnasal drip  Neck: No JVD, no TMG, no carotid bruits  Lungs: No use of accessory muscles, no dullness to percussion, clear without rales or rhonchi  Cardiovascular: RRR, heart sounds normal, no murmur or gallops, no peripheral edema  Abdomen: soft and NT, no HSM,  BS normal  Musculoskeletal:  Neuro: alert, non focal  Skin: Warm, no lesions  Rash over the left forearm noted compatible with eczema  No results found.     Assessment & Plan:  I personally reviewed all images and lab data in the [facility] system as well as any outside material available during thi  office visit and agree with  the  radiology impressions.   Postoperative hypothyroidism Postoperative hypothyroidism stable at this time will check thyroid function continue thyroid replacement  Acute pain of left shoulder Prior history of right shoulder pain with surgery for rotator cuff impingement now with new onset pain in the left shoulder with decreased range of motion  Plan x-rays of the left shoulder and will alert orthopedics  Tobacco use    . Current smoking consumption amount: Down to 2-4 cigarettes daily  . Dicsussion on advise to quit smoking and smoking impacts: Importance of quitting smoking due to impacts on cardiovascular health and stroke recurrence  . Patient's willingness to quit: Is willing to quit  . Methods to quit smoking discussed: We discussed behavioral modification the use of sertraline to help with her anxiety and depression and nicotine replacement therapy with lozenge 4 mg 3 times daily as needed  . Medication management of smoking session drugs discussed: I have prescribed nicotine replacement therapy  . Resources provided:  AVS   . Setting quit date date not set  . Follow-up arranged follow-up OV 4 months   Time spent counseling the patient: 5 minutes   Skin rash Skin rash over the left forearm secondary to eczema will give Temovate cream   Delesia was seen today for shoulder pain.  Diagnoses and all orders for this visit:  Acute pain of left shoulder -     Cancel: DG Shoulder Left; Future -     DG Shoulder Left; Future  Postoperative hypothyroidism  Tobacco use  Skin rash  Other orders -  HYDROcodone-acetaminophen (NORCO) 5-325 MG tablet; Take 1-2 tablets by mouth daily as needed. -     clobetasol cream (TEMOVATE) 0.05 %; Apply 1 application topically 2 (two) times daily.  Short-term course of Norco sent for the patient drug database was checked The patient is fully vaccinated for COVID

## 2021-02-18 ENCOUNTER — Ambulatory Visit: Payer: Commercial Managed Care - PPO

## 2021-02-18 ENCOUNTER — Other Ambulatory Visit: Payer: Self-pay | Admitting: Critical Care Medicine

## 2021-02-18 DIAGNOSIS — G8929 Other chronic pain: Secondary | ICD-10-CM

## 2021-02-18 DIAGNOSIS — M25511 Pain in right shoulder: Secondary | ICD-10-CM

## 2021-02-18 DIAGNOSIS — M25512 Pain in left shoulder: Secondary | ICD-10-CM

## 2021-02-18 DIAGNOSIS — M25612 Stiffness of left shoulder, not elsewhere classified: Secondary | ICD-10-CM

## 2021-02-18 DIAGNOSIS — M6281 Muscle weakness (generalized): Secondary | ICD-10-CM

## 2021-02-18 DIAGNOSIS — M25611 Stiffness of right shoulder, not elsewhere classified: Secondary | ICD-10-CM

## 2021-02-18 DIAGNOSIS — M542 Cervicalgia: Secondary | ICD-10-CM

## 2021-02-18 DIAGNOSIS — R293 Abnormal posture: Secondary | ICD-10-CM

## 2021-02-18 NOTE — Therapy (Signed)
[place]. [place], [place], [address] Phone: [phone]   Fax:  [phone]  Physical Therapy Treatment  Patient Details  Name: Meagan Flores MRN: [id number] Date of Birth: March 15, 1966 Referring Provider (PT): Dwana Melena PA-C for Dr. Erlinda Hong   Encounter Date: 02/18/2021   PT End of Session - 02/18/21 0809    Visit Number 5    Number of Visits 17    Date for PT Re-Evaluation 03/27/21    PT Start Time 0801    PT Stop Time 0845    PT Time Calculation (min) 44 min    Activity Tolerance Patient limited by pain;Patient limited by lethargy    Behavior During Therapy [facility] for tasks assessed/performed           Past Medical History:  Diagnosis Date  . Aneurysm ([place]) 03/18/2020  . 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 COVID-19 virus infection 02/14/2020  . 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) tear of right shoulder 09/30/2020  . Essential hypertension   . Glenohumeral arthritis 12/27/2014  . History of stroke   . History of TIA (transient ischemic attack) 02/14/2020  . Hyperlipidemia   . Hypothyroidism   . Impingement syndrome of right shoulder 12/24/2016  . Mixed hyperlipidemia   . 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 Prediabetes   . Preop cardiovascular exam 11/25/2020  . Pulmonary hypertension, unspecified ([place]) 11/25/2020  . 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 Rotator cuff impingement syndrome of right shoulder 09/30/2020  . Stroke ([place])   . Tendinopathy of right biceps tendon 09/30/2020  . Thyroid disease   . TIA (transient ischemic attack) 04/15/2018  . Tobacco use 09/28/2016    Past Surgical History:  Procedure Laterality Date  . PARTIAL HYSTERECTOMY    . right arm sx    . SHOULDER ARTHROSCOPY WITH BICEPSTENOTOMY Right 01/21/2021   Procedure: SHOULDER ARTHROSCOPY WITH BICEPSTENOTOMY;  Surgeon: Leandrew Koyanagi, MD;  Location: [facility];  Service:  Orthopedics;  Laterality: Right;  . SHOULDER ARTHROSCOPY WITH DISTAL CLAVICLE RESECTION Right 01/12/2017   Procedure: RIGHT SHOULDER ARTHROSCOPY WITH  SUBACROMIAL DECOMPRESSION, DISTAL CLAVICLE EXCISION, extensive debridement;  Surgeon: Leandrew Koyanagi, MD;  Location: [facility];  Service: Orthopedics;  Laterality: Right;  . THYMECTOMY  2004  . THYROIDECTOMY      There were no vitals filed for this visit.   Subjective Assessment - 02/18/21 0803    Subjective Saw PCP yesterday and xrays of left shoulders taken. Seeing Dr Erlinda Hong on the 6th. Started new pain medicine.    Pertinent History History of surgery on R shoulder x2  and elbow surgery x2.    Patient Stated Goals decrease pain, get back to PLOF    Currently in Pain? Yes    Pain Score --   7/10 right shoulder ache. 6/10 left shoulder sharp pains.   Pain Location Shoulder            [facility] Adult PT Treatment/Exercise - 02/18/21 0001      Shoulder Exercises: Seated   Retraction Both;15 reps    Retraction Limitations 3 seconds      Hand Exercises   Other Hand Exercises orange stress ball gripping 5" x 10      Wrist Exercises   Wrist Flexion Strengthening;Right;10 reps;Seated;Bar weights/barbell    Bar Weights/Barbell (Wrist Flexion) 1 lb    Wrist Extension Strengthening;Right;10 reps;Seated;Bar weights/barbell    Bar Weights/Barbell (Wrist Extension) 1 lb    Wrist Radial  Deviation Strengthening;Right;10 reps;Seated;Bar weights/barbell    Bar Weights/Barbell (Radial Deviation) 1 lb      Modalities   Modalities Cryotherapy;Electrical Stimulation      Cryotherapy   Number Minutes Cryotherapy 15 Minutes    Cryotherapy Location Shoulder   right   Type of Cryotherapy Ice pack      Electrical Stimulation   Electrical Stimulation Location Right shoulder (anterior, superior , lateral)    Electrical Stimulation Action IFC    Electrical Stimulation Goals Tone;Pain      Manual Therapy   Joint Mobilization gentle  distraction with PROM    Soft tissue mobilization STM UT, LS, pecs    Passive ROM R shoulder all planes to tolerance- very limited by muscle guarding and spasms in all planes, moreso for abduction                    PT Short Term Goals - 02/03/21 1341      PT SHORT TERM GOAL #1   Title Independent with initial HEP    Status Achieved             PT Long Term Goals - 01/30/21 1221      PT LONG TERM GOAL #1   Title Independent with advanced HEP    Time 8    Period Weeks    Status New    Target Date 01/30/21      PT LONG TERM GOAL #2   Title Pt will be able to return to performing some household chores and grooming utilizing R arm    Baseline unable to do household chores at eval    Time 8    Period Weeks    Status New    Target Date 03/27/21      PT LONG TERM GOAL #3   Title Pt will demo R shoulder ROM symmetrical to left side    Time 8    Period Weeks    Status New    Target Date 03/27/21      PT LONG TERM GOAL #4   Title Grip strength on the right to be greater than or equal to left to facilitate return to job duties, grasp with functional activities.    Time 8    Period Weeks    Status New    Target Date 03/27/21                 Plan - 02/18/21 0811    Clinical Impression Statement Meagan Flores continues to have alot of muscle guarding and pain in the R shoulder. Continued distal hand/wrist strengthening with the right arm supported to address distal arm and grip weakness with good tolerance, fatiguing quickly requiring rest breaks. Manual therapy tolerated fairly within pain limited ranges but she has significant mms guarding and spasms which continue to increase pain even with attempts of just passive ROM. Discussed utility of e-stim for short term pain relief and she was agreeable to trial it today. Utilized estim and ice at end of session today for pain mgmt with good tolerance    Rehab Potential Good    PT Frequency 2x / week    PT Duration 8 weeks     PT Treatment/Interventions Vasopneumatic Device;Taping;Electrical Stimulation;Cryotherapy;ADLs/Self Care Home Management;Iontophoresis 4mg /ml Dexamethasone;Moist Heat;Neuromuscular re-education;Therapeutic exercise;Therapeutic activities;Functional mobility training;Patient/family education;Manual techniques;Scar mobilization    PT Next Visit Plan Slowly progress PROM/AAROM per protocol  madalities as needed. Follow up regarding shoulder pain after first trial of estim    Consulted  and Agree with Plan of Care Patient           Patient will benefit from skilled therapeutic intervention in order to improve the following deficits and impairments:  Decreased range of motion,Increased fascial restricitons,Impaired UE functional use,Pain,Improper body mechanics,Impaired flexibility,Hypomobility,Decreased balance,Decreased mobility,Decreased strength,Increased edema,Postural dysfunction  Visit Diagnosis: Acute pain of right shoulder  Stiffness of right shoulder, not elsewhere classified  Cervicalgia  Chronic left shoulder pain  Stiffness of left shoulder, not elsewhere classified  Abnormal posture  Muscle weakness (generalized)     Problem List Patient Active Problem List   Diagnosis Date Noted  . Skin rash 02/17/2021  . Hypothyroidism   . Preop cardiovascular exam 11/25/2020  . Prediabetes   . Hyperlipidemia   . 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) tear of right shoulder 09/30/2020  . Rotator cuff impingement syndrome of right shoulder 09/30/2020  . 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 Aneurysm ([place]) 03/18/2020  . History of TIA (transient ischemic attack) 02/14/2020  . History of stroke   . Mixed hyperlipidemia   . Essential hypertension   . Acute pain of left shoulder 09/08/2017  . Impingement syndrome of right shoulder 12/24/2016  . Tobacco use 09/28/2016  . Glenohumeral arthritis 12/27/2014  .  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    Hall Busing, PT, DPT 02/18/2021, 8:43 AM  [place]. [place], [place], [address] Phone: [phone]   Fax:  [phone]  Name: Meagan Flores MRN: [id number] Date of Birth: November 17, 1966

## 2021-02-20 ENCOUNTER — Ambulatory Visit: Payer: Commercial Managed Care - PPO

## 2021-02-20 ENCOUNTER — Telehealth: Payer: Self-pay

## 2021-02-20 ENCOUNTER — Other Ambulatory Visit: Payer: Self-pay

## 2021-02-20 DIAGNOSIS — R293 Abnormal posture: Secondary | ICD-10-CM

## 2021-02-20 DIAGNOSIS — M25611 Stiffness of right shoulder, not elsewhere classified: Secondary | ICD-10-CM

## 2021-02-20 DIAGNOSIS — M25511 Pain in right shoulder: Secondary | ICD-10-CM | POA: Diagnosis not present

## 2021-02-20 DIAGNOSIS — G8929 Other chronic pain: Secondary | ICD-10-CM

## 2021-02-20 DIAGNOSIS — M25612 Stiffness of left shoulder, not elsewhere classified: Secondary | ICD-10-CM

## 2021-02-20 DIAGNOSIS — M542 Cervicalgia: Secondary | ICD-10-CM

## 2021-02-20 DIAGNOSIS — M6281 Muscle weakness (generalized): Secondary | ICD-10-CM

## 2021-02-20 NOTE — Therapy (Signed)
[place]. [place], [place], [address] Phone: [phone]   Fax:  [phone]  Physical Therapy Treatment  Patient Details  Name: Meagan Flores MRN: [id number] Date of Birth: [birth year] Referring Provider (PT): Dwana Melena PA-C for Dr. Erlinda Hong   Encounter Date: 02/20/2021   PT End of Session - 02/20/21 0833    Visit Number 6    Number of Visits 17    Date for PT Re-Evaluation 03/27/21    PT Start Time 0800    PT Stop Time 0845    PT Time Calculation (min) 45 min    Activity Tolerance Patient limited by pain;Patient limited by lethargy    Behavior During Therapy [facility] for tasks assessed/performed           Past Medical History:  Diagnosis Date  . Aneurysm ([place]) 03/18/2020  . 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 COVID-19 virus infection 02/14/2020  . 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) tear of right shoulder 09/30/2020  . Essential hypertension   . Glenohumeral arthritis 12/27/2014  . History of stroke   . History of TIA (transient ischemic attack) 02/14/2020  . Hyperlipidemia   . Hypothyroidism   . Impingement syndrome of right shoulder 12/24/2016  . Mixed hyperlipidemia   . 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 Prediabetes   . Preop cardiovascular exam 11/25/2020  . Pulmonary hypertension, unspecified ([place]) 11/25/2020  . 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 Rotator cuff impingement syndrome of right shoulder 09/30/2020  . Stroke ([place])   . Tendinopathy of right biceps tendon 09/30/2020  . Thyroid disease   . TIA (transient ischemic attack) 04/15/2018  . Tobacco use 09/28/2016    Past Surgical History:  Procedure Laterality Date  . PARTIAL HYSTERECTOMY    . right arm sx    . SHOULDER ARTHROSCOPY WITH BICEPSTENOTOMY Right 01/21/2021   Procedure: SHOULDER ARTHROSCOPY WITH BICEPSTENOTOMY;  Surgeon: Leandrew Koyanagi, MD;  Location: [place];  Service:  Orthopedics;  Laterality: Right;  . SHOULDER ARTHROSCOPY WITH DISTAL CLAVICLE RESECTION Right 01/12/2017   Procedure: RIGHT SHOULDER ARTHROSCOPY WITH  SUBACROMIAL DECOMPRESSION, DISTAL CLAVICLE EXCISION, extensive debridement;  Surgeon: Leandrew Koyanagi, MD;  Location: [facility];  Service: Orthopedics;  Laterality: Right;  . THYMECTOMY  2004  . THYROIDECTOMY      There were no vitals filed for this visit.   Subjective Assessment - 02/20/21 0805    Subjective Continues to have alot right shoulder pain. Seeing Dr Erlinda Hong on the 6th.    Pertinent History History of surgery on R shoulder x2  and elbow surgery x2.    Patient Stated Goals decrease pain, get back to PLOF    Currently in Pain? Yes    Pain Score 7     Pain Location Shoulder    Pain Orientation Right    Pain Descriptors / Indicators Aching    Pain Type Acute pain;Surgical pain               [facility] Adult PT Treatment/Exercise - 02/20/21 0001      Shoulder Exercises: Seated   Retraction Both;15 reps    Retraction Limitations 3 seconds      Shoulder Exercises: Standing   Other Standing Exercises AAROM ball on table x20 forward  hand over hand, orange ball , x 15 on ball abduction within pain limited range     Shoulder Exercises: Pulleys   Flexion 3 minutes   within pain limited range  Hand Exercises   Other Hand Exercises orange stress ball gripping 5" x 10      Wrist Exercises   Wrist Flexion Strengthening;Right;Seated;Bar weights/barbell;15 reps    Bar Weights/Barbell (Wrist Flexion) 1 lb    Wrist Extension Strengthening;Right;Seated;Bar weights/barbell;15 reps    Bar Weights/Barbell (Wrist Extension) 1 lb    Wrist Radial Deviation Strengthening;Right;Seated;Bar weights/barbell;15 IT trainer (Radial Deviation) 1 lb      Modalities   Modalities Electrical Stimulation;Moist Heat      Moist Heat Therapy   Number Minutes Moist Heat 15 Minutes    Moist Heat Location Shoulder   Right      Electrical Stimulation   Electrical Stimulation Location Right shoulder (anterior, superior , lateral)    Electrical Stimulation Action IFC    Electrical Stimulation Goals Tone;Pain      Manual Therapy   Joint Mobilization gentle distraction with PROM    Passive ROM R shoulder all planes to tolerance- very limited by muscle guarding and spasms in all planes, moreso for abduction                    PT Short Term Goals - 02/03/21 1341      PT SHORT TERM GOAL #1   Title Independent with initial HEP    Status Achieved             PT Long Term Goals - 01/30/21 1221      PT LONG TERM GOAL #1   Title Independent with advanced HEP    Time 8    Period Weeks    Status New    Target Date 01/30/21      PT LONG TERM GOAL #2   Title Pt will be able to return to performing some household chores and grooming utilizing R arm    Baseline unable to do household chores at eval    Time 8    Period Weeks    Status New    Target Date 03/27/21      PT LONG TERM GOAL #3   Title Pt will demo R shoulder ROM symmetrical to left side    Time 8    Period Weeks    Status New    Target Date 03/27/21      PT LONG TERM GOAL #4   Title Grip strength on the right to be greater than or equal to left to facilitate return to job duties, grasp with functional activities.    Time 8    Period Weeks    Status New    Target Date 03/27/21                 Plan - 02/20/21 7048    Clinical Impression Statement Meagan Flores continues to have alot of muscle guarding and pain in the R shoulder (as well as the left/non surgical shoulder). Continued distal hand/wrist strengthening with the right arm supported to address distal arm and grip weakness with good tolerance, fatiguing quickly requiring rest breaks. Slight improvement in tolerance to AAROM/PROM for flexion and abduction. Estim at end of session with heat instead of ice today as she reports she had more tightness/spasms with the ice last  visit although the stim felt good.    Rehab Potential Good    PT Frequency 2x / week    PT Duration 8 weeks    PT Treatment/Interventions Vasopneumatic Device;Taping;Electrical Stimulation;Cryotherapy;ADLs/Self Care Home Management;Iontophoresis 4mg /ml Dexamethasone;Moist Heat;Neuromuscular re-education;Therapeutic exercise;Therapeutic activities;Functional mobility training;Patient/family education;Manual techniques;Scar  mobilization    PT Next Visit Plan Slowly progress PROM/AAROM per protocol  madalities as needed.    Consulted and Agree with Plan of Care Patient           Patient will benefit from skilled therapeutic intervention in order to improve the following deficits and impairments:  Decreased range of motion,Increased fascial restricitons,Impaired UE functional use,Pain,Improper body mechanics,Impaired flexibility,Hypomobility,Decreased balance,Decreased mobility,Decreased strength,Increased edema,Postural dysfunction  Visit Diagnosis: Acute pain of right shoulder  Stiffness of right shoulder, not elsewhere classified  Cervicalgia  Chronic left shoulder pain  Stiffness of left shoulder, not elsewhere classified  Abnormal posture  Muscle weakness (generalized)     Problem List Patient Active Problem List   Diagnosis Date Noted  . Skin rash 02/17/2021  . Hypothyroidism   . Preop cardiovascular exam 11/25/2020  . Prediabetes   . Hyperlipidemia   . 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) tear of right shoulder 09/30/2020  . Rotator cuff impingement syndrome of right shoulder 09/30/2020  . 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 Aneurysm ([place]) 03/18/2020  . History of TIA (transient ischemic attack) 02/14/2020  . History of stroke   . Mixed hyperlipidemia   . Essential hypertension   . Acute pain of left shoulder 09/08/2017  . Impingement syndrome of right shoulder 12/24/2016  . Tobacco use  09/28/2016  . Glenohumeral arthritis 12/27/2014  . 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    Hall Busing, PT, DPT 02/20/2021, 8:36 AM  [place]. [place], [place], [address] Phone: [phone]   Fax:  [phone]  Name: Meagan Flores MRN: [id number] Date of Birth: 04/13/1966

## 2021-02-20 NOTE — Telephone Encounter (Signed)
Contacted pt to go over lab results pt is aware and doesn't have any questions or concerns 

## 2021-02-24 ENCOUNTER — Other Ambulatory Visit: Payer: Self-pay

## 2021-02-24 ENCOUNTER — Ambulatory Visit: Payer: Commercial Managed Care - PPO

## 2021-02-24 DIAGNOSIS — M25511 Pain in right shoulder: Secondary | ICD-10-CM

## 2021-02-24 DIAGNOSIS — R293 Abnormal posture: Secondary | ICD-10-CM

## 2021-02-24 DIAGNOSIS — M542 Cervicalgia: Secondary | ICD-10-CM

## 2021-02-24 DIAGNOSIS — M25611 Stiffness of right shoulder, not elsewhere classified: Secondary | ICD-10-CM

## 2021-02-24 DIAGNOSIS — G8929 Other chronic pain: Secondary | ICD-10-CM

## 2021-02-24 DIAGNOSIS — M25612 Stiffness of left shoulder, not elsewhere classified: Secondary | ICD-10-CM

## 2021-02-24 DIAGNOSIS — M6281 Muscle weakness (generalized): Secondary | ICD-10-CM

## 2021-02-24 NOTE — Therapy (Signed)
[place]. [place], [place], [address] Phone: [phone]   Fax:  [phone]  Physical Therapy Treatment/Progress update  Patient Details  Name: Meagan Flores MRN: [id number] Date of Birth: Sep 27, 1966 Referring Provider (PT): Dwana Melena PA-C for Dr. Erlinda Hong   Encounter Date: 02/24/2021   PT End of Session - 02/24/21 0807    Visit Number 7    Number of Visits 17    Date for PT Re-Evaluation 03/27/21    PT Start Time 0800    PT Stop Time 0845    PT Time Calculation (min) 45 min    Activity Tolerance Patient limited by pain;Patient limited by lethargy    Behavior During Therapy [facility] for tasks assessed/performed           Past Medical History:  Diagnosis Date  . Aneurysm ([place]) 03/18/2020  . 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 COVID-19 virus infection 02/14/2020  . 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) tear of right shoulder 09/30/2020  . Essential hypertension   . Glenohumeral arthritis 12/27/2014  . History of stroke   . History of TIA (transient ischemic attack) 02/14/2020  . Hyperlipidemia   . Hypothyroidism   . Impingement syndrome of right shoulder 12/24/2016  . Mixed hyperlipidemia   . 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 Prediabetes   . Preop cardiovascular exam 11/25/2020  . Pulmonary hypertension, unspecified ([place]) 11/25/2020  . 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 Rotator cuff impingement syndrome of right shoulder 09/30/2020  . Stroke ([place])   . Tendinopathy of right biceps tendon 09/30/2020  . Thyroid disease   . TIA (transient ischemic attack) 04/15/2018  . Tobacco use 09/28/2016    Past Surgical History:  Procedure Laterality Date  . PARTIAL HYSTERECTOMY    . right arm sx    . SHOULDER ARTHROSCOPY WITH BICEPSTENOTOMY Right 01/21/2021   Procedure: SHOULDER ARTHROSCOPY WITH BICEPSTENOTOMY;  Surgeon: Leandrew Koyanagi, MD;  Location: [facility];   Service: Orthopedics;  Laterality: Right;  . SHOULDER ARTHROSCOPY WITH DISTAL CLAVICLE RESECTION Right 01/12/2017   Procedure: RIGHT SHOULDER ARTHROSCOPY WITH  SUBACROMIAL DECOMPRESSION, DISTAL CLAVICLE EXCISION, extensive debridement;  Surgeon: Leandrew Koyanagi, MD;  Location: [facility];  Service: Orthopedics;  Laterality: Right;  . THYMECTOMY  2004  . THYROIDECTOMY      There were no vitals filed for this visit.   Subjective Assessment - 02/24/21 0803    Subjective Both shoulders hurting equally today. Following up with Dr Erlinda Hong about the Left  shoulder pain    Pertinent History History of surgery on R shoulder x2  and elbow surgery x2.    Patient Stated Goals decrease pain, get back to PLOF    Currently in Pain? Yes    Pain Score 7     Pain Location Shoulder    Pain Orientation Right;Left    Pain Descriptors / Indicators Aching    Pain Type Acute pain;Surgical pain;Chronic pain              [facility] PT Assessment - 02/24/21 0001      PROM   Right/Left Shoulder Left    Right Shoulder Flexion 90 Degrees   sig guarding, pain   Right Shoulder ABduction 80 Degrees   sig pain and guarding   Right Shoulder External Rotation 50 Degrees   at 45 deg ABD   Left Shoulder Flexion 110 Degrees   Active, end range pain   Left Shoulder ABduction 60 Degrees  Active, limited by pain     Palpation   Palpation comment TTP and mms tautness throughout neck UT, LS, superior and anterior shoulder                         [facility] Adult PT Treatment/Exercise - 02/24/21 0001      Shoulder Exercises: Standing   Retraction 15 reps    Retraction Limitations at corner of doorway    Other Standing Exercises AAROM ball on table 2x10 forward  hand over hand, small red ball, 1x10 orange ball      Shoulder Exercises: Pulleys   Flexion 3 minutes   within pain limited range     Shoulder Exercises: ROM/Strengthening   Pendulum 30 seconds - max cues to prevent active/pain provoking  motions      Hand Exercises   Other Hand Exercises orange stress ball gripping 5" x 10      Wrist Exercises   Wrist Flexion Strengthening;Right;Seated;Bar weights/barbell;15 reps;10 reps    Bar Weights/Barbell (Wrist Flexion) 1 lb    Wrist Extension Strengthening;Right;Seated;Bar weights/barbell;15 reps;10 reps    Bar Weights/Barbell (Wrist Extension) 1 lb    Wrist Radial Deviation Strengthening;Right;Seated;Bar weights/barbell;15 reps;10 reps    Bar Weights/Barbell (Radial Deviation) 1 lb      Moist Heat Therapy   Number Minutes Moist Heat 12 Minutes    Moist Heat Location Shoulder   Right     Electrical Stimulation   Electrical Stimulation Location Right shoulder (anterior, superior , lateral)    Electrical Stimulation Action IFC    Electrical Stimulation Parameters 12 minutes    Electrical Stimulation Goals Tone;Pain                    PT Short Term Goals - 02/03/21 1341      PT SHORT TERM GOAL #1   Title Independent with initial HEP    Status Achieved             PT Long Term Goals - 01/30/21 1221      PT LONG TERM GOAL #1   Title Independent with advanced HEP    Time 8    Period Weeks    Status New    Target Date 01/30/21      PT LONG TERM GOAL #2   Title Pt will be able to return to performing some household chores and grooming utilizing R arm    Baseline unable to do household chores at eval    Time 8    Period Weeks    Status New    Target Date 03/27/21      PT LONG TERM GOAL #3   Title Pt will demo R shoulder ROM symmetrical to left side    Time 8    Period Weeks    Status New    Target Date 03/27/21      PT LONG TERM GOAL #4   Title Grip strength on the right to be greater than or equal to left to facilitate return to job duties, grasp with functional activities.    Time 8    Period Weeks    Status New    Target Date 03/27/21                 Plan - 02/24/21 0810    Clinical Impression Statement Meagan Flores is approximately 5  weeks p/o R shoulder arthroscopy with subacromial decompression, RCR and Bicep tedon Repair. Meagan Flores continues to have  alot of muscle guarding and pain in the R shoulder ("feels like a toothache"), in addition to continued left shoulder pain. She has been tolerating AAROM activites a bit better for the right shoulder within pain limited ranges. PROM continues to be significantly guarded and limtied by pain. She reports she has been overusing her left arm since surgery and feels that may have contributed to the increasing left shoulder pain. Gradually progressing distal hand/wrist strengthening with the right arm supported to address distal arm and grip weakness with good tolerance, fatiguing quickly requiring rest breaks. Estim at end of session with heat applied end of session for management of pain and ms spasms with some improvement. She is seeing Dr Erlinda Hong tomorrow about the Left shoulder, plan to reassess POC as needed after that appointment.    Rehab Potential Good    PT Frequency 2x / week    PT Duration 8 weeks    PT Treatment/Interventions Vasopneumatic Device;Taping;Electrical Stimulation;Cryotherapy;ADLs/Self Care Home Management;Iontophoresis 4mg /ml Dexamethasone;Moist Heat;Neuromuscular re-education;Therapeutic exercise;Therapeutic activities;Functional mobility training;Patient/family education;Manual techniques;Scar mobilization    PT Next Visit Plan Follow up regarding appointment with MD next visit.  Slowly progress PROM/AAROM,  modalities as needed.    Consulted and Agree with Plan of Care Patient           Patient will benefit from skilled therapeutic intervention in order to improve the following deficits and impairments:  Decreased range of motion,Increased fascial restricitons,Impaired UE functional use,Pain,Improper body mechanics,Impaired flexibility,Hypomobility,Decreased balance,Decreased mobility,Decreased strength,Increased edema,Postural dysfunction  Visit Diagnosis: Acute pain  of right shoulder  Stiffness of right shoulder, not elsewhere classified  Cervicalgia  Chronic left shoulder pain  Stiffness of left shoulder, not elsewhere classified  Abnormal posture  Muscle weakness (generalized)     Problem List Patient Active Problem List   Diagnosis Date Noted  . Skin rash 02/17/2021  . Hypothyroidism   . Preop cardiovascular exam 11/25/2020  . Prediabetes   . Hyperlipidemia   . 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) tear of right shoulder 09/30/2020  . Rotator cuff impingement syndrome of right shoulder 09/30/2020  . 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 Aneurysm ([place]) 03/18/2020  . History of TIA (transient ischemic attack) 02/14/2020  . History of stroke   . Mixed hyperlipidemia   . Essential hypertension   . Acute pain of left shoulder 09/08/2017  . Impingement syndrome of right shoulder 12/24/2016  . Tobacco use 09/28/2016  . Glenohumeral arthritis 12/27/2014  . 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    Hall Busing, PT, DPT 02/24/2021, 8:42 AM  [place]. [place], [place], [address] Phone: [phone]   Fax:  [phone]  Name: Meagan Flores MRN: [id number] Date of Birth: [birth year]

## 2021-02-25 ENCOUNTER — Ambulatory Visit (INDEPENDENT_AMBULATORY_CARE_PROVIDER_SITE_OTHER): Payer: Commercial Managed Care - PPO | Admitting: Orthopaedic Surgery

## 2021-02-25 ENCOUNTER — Encounter: Payer: Self-pay | Admitting: Orthopaedic Surgery

## 2021-02-25 ENCOUNTER — Ambulatory Visit: Payer: Self-pay

## 2021-02-25 DIAGNOSIS — G8929 Other chronic pain: Secondary | ICD-10-CM | POA: Diagnosis not present

## 2021-02-25 DIAGNOSIS — M25512 Pain in left shoulder: Secondary | ICD-10-CM | POA: Diagnosis not present

## 2021-02-25 DIAGNOSIS — M25572 Pain in left ankle and joints of left foot: Secondary | ICD-10-CM | POA: Diagnosis not present

## 2021-02-25 NOTE — Progress Notes (Signed)
Subjective: Patient is here for ultrasound-guided left long head biceps tendon sheath injection.  Objective: Point tender over the long head biceps tendon.  Procedure: Ultrasound guided injection is preferred based studies that show increased duration, increased effect, greater accuracy, decreased procedural pain, increased response rate, and decreased cost with ultrasound guided versus blind injection.   Verbal informed consent obtained.  Time-out conducted.  Noted no overlying erythema, induration, or other signs of local infection. Ultrasound-guided left biceps injection: After sterile prep with Betadine, injected 4 cc 0.25% bupivocaine without epinephrine and 6 mg betamethasone into the long head biceps tendon sheath without complication.  Good immediate relief.

## 2021-02-25 NOTE — Progress Notes (Signed)
Office Visit Note   Patient: Meagan Flores           Date of Birth: Jul 18, 1966           MRN: [id number] Visit Date: 02/25/2021              Requested by: Elsie Stain, MD [address],  [place] [address] PCP: Elsie Stain, MD   Assessment & Plan: Visit Diagnoses:  1. Chronic left shoulder pain     Plan: Impression is left biceps tendinopathy.  We have discussed referral to Dr. Junius Roads for ultrasound-guided biceps tendon sheath injection.  She will follow up with [place] as needed.  Follow-Up Instructions: Return if symptoms worsen or fail to improve.   Orders:  Orders Placed This Encounter  Procedures  . US Guided Needle Placement - No Linked Charges   No orders of the defined types were placed in this encounter.     Procedures: No procedures performed   Clinical Data: No additional findings.   Subjective: Chief Complaint  Patient presents with  . Left Shoulder - Pain    HPI patient is a pleasant 55 year old female who comes in today with left shoulder pain.  The pain she has has been ongoing for several weeks.  She is status post right shoulder arthroscopic debridement and biceps tenotomy about 5 weeks ago and notes that she has been favoring the left since.  The pain is to the anterior aspect.  Any movement of the left shoulder seems to worsen her symptoms.  No paresthesias.  She has not had previous cortisone injections to the left shoulder.  Review of Systems as detailed in HPI.  All others reviewed and are negative.   Objective: Vital Signs: There were no vitals taken for this visit.  Physical Exam well-developed well-nourished female no acute distress.  Alert oriented x3.  Ortho Exam examination of the left shoulder shows 75% range of motion all planes.  Positive speeds test.  Negative empty can.  Near full strength.  She is neurovascular intact distally.  Specialty Comments:  No specialty comments available.  Imaging: No new  imaging   PMFS History: Patient Active Problem List   Diagnosis Date Noted  . Skin rash 02/17/2021  . Hypothyroidism   . Preop cardiovascular exam 11/25/2020  . Prediabetes   . Hyperlipidemia   . 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) tear of right shoulder 09/30/2020  . Rotator cuff impingement syndrome of right shoulder 09/30/2020  . 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 Aneurysm ([place]) 03/18/2020  . History of TIA (transient ischemic attack) 02/14/2020  . History of stroke   . Mixed hyperlipidemia   . Essential hypertension   . Acute pain of left shoulder 09/08/2017  . Impingement syndrome of right shoulder 12/24/2016  . Tobacco use 09/28/2016  . Glenohumeral arthritis 12/27/2014  . 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   Past Medical History:  Diagnosis Date  . Aneurysm ([place]) 03/18/2020  . 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 COVID-19 virus infection 02/14/2020  . 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) tear of right shoulder 09/30/2020  . Essential hypertension   . Glenohumeral arthritis 12/27/2014  . History of stroke   . History of TIA (transient ischemic attack) 02/14/2020  . Hyperlipidemia   . Hypothyroidism   . Impingement syndrome of right shoulder 12/24/2016  . Mixed hyperlipidemia   . 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 Prediabetes   . Preop cardiovascular exam  11/25/2020  . Pulmonary hypertension, unspecified ([place]) 11/25/2020  . 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 Rotator cuff impingement syndrome of right shoulder 09/30/2020  . Stroke ([place])   . Tendinopathy of right biceps tendon 09/30/2020  . Thyroid disease   . TIA (transient ischemic attack) 04/15/2018  . Tobacco use 09/28/2016    Family History  Problem Relation Age of Onset  . Stomach cancer Sister   . Colon polyps Sister   . Esophageal cancer Neg Hx   . Rectal cancer Neg Hx   . Colon  cancer Neg Hx   . Breast cancer Neg Hx     Past Surgical History:  Procedure Laterality Date  . PARTIAL HYSTERECTOMY    . right arm sx    . SHOULDER ARTHROSCOPY WITH BICEPSTENOTOMY Right 01/21/2021   Procedure: SHOULDER ARTHROSCOPY WITH BICEPSTENOTOMY;  Surgeon: Leandrew Koyanagi, MD;  Location: [facility];  Service: Orthopedics;  Laterality: Right;  . SHOULDER ARTHROSCOPY WITH DISTAL CLAVICLE RESECTION Right 01/12/2017   Procedure: RIGHT SHOULDER ARTHROSCOPY WITH  SUBACROMIAL DECOMPRESSION, DISTAL CLAVICLE EXCISION, extensive debridement;  Surgeon: Leandrew Koyanagi, MD;  Location: [facility];  Service: Orthopedics;  Laterality: Right;  . THYMECTOMY  2004  . THYROIDECTOMY     Social History   Occupational History  . Not on file  Tobacco Use  . Smoking status: Current Every Day Smoker    Packs/day: 0.25    Years: 34.00    Pack years: 8.50  . Smokeless tobacco: Never Used  Vaping Use  . Vaping Use: Never used  Substance and Sexual Activity  . Alcohol use: Yes    Comment: occassional  . Drug use: No  . Sexual activity: Not on file

## 2021-02-26 ENCOUNTER — Other Ambulatory Visit: Payer: Self-pay

## 2021-02-26 ENCOUNTER — Ambulatory Visit: Payer: Commercial Managed Care - PPO

## 2021-02-26 DIAGNOSIS — M25511 Pain in right shoulder: Secondary | ICD-10-CM

## 2021-02-26 DIAGNOSIS — M542 Cervicalgia: Secondary | ICD-10-CM

## 2021-02-26 DIAGNOSIS — G8929 Other chronic pain: Secondary | ICD-10-CM

## 2021-02-26 DIAGNOSIS — R293 Abnormal posture: Secondary | ICD-10-CM

## 2021-02-26 DIAGNOSIS — M25612 Stiffness of left shoulder, not elsewhere classified: Secondary | ICD-10-CM

## 2021-02-26 DIAGNOSIS — M25611 Stiffness of right shoulder, not elsewhere classified: Secondary | ICD-10-CM

## 2021-02-26 DIAGNOSIS — M6281 Muscle weakness (generalized): Secondary | ICD-10-CM

## 2021-02-26 NOTE — Therapy (Signed)
[place]. [place], [place], [address] Phone: [phone]   Fax:  [phone]  Physical Therapy Treatment  Patient Details  Name: Meagan Flores MRN: [id number] Date of Birth: 03-11-1966 Referring Provider (PT): Dwana Melena PA-C for Dr. Erlinda Hong   Encounter Date: 02/26/2021   PT End of Session - 02/26/21 0806    Visit Number 8    Number of Visits 17    Date for PT Re-Evaluation 03/27/21    PT Start Time 0800    PT Stop Time 0845    PT Time Calculation (min) 45 min    Activity Tolerance Patient limited by pain;Patient tolerated treatment well    Behavior During Therapy [facility] for tasks assessed/performed           Past Medical History:  Diagnosis Date  . Aneurysm ([place]) 03/18/2020  . 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 COVID-19 virus infection 02/14/2020  . 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) tear of right shoulder 09/30/2020  . Essential hypertension   . Glenohumeral arthritis 12/27/2014  . History of stroke   . History of TIA (transient ischemic attack) 02/14/2020  . Hyperlipidemia   . Hypothyroidism   . Impingement syndrome of right shoulder 12/24/2016  . Mixed hyperlipidemia   . 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 Prediabetes   . Preop cardiovascular exam 11/25/2020  . Pulmonary hypertension, unspecified ([place]) 11/25/2020  . 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 Rotator cuff impingement syndrome of right shoulder 09/30/2020  . Stroke ([place])   . Tendinopathy of right biceps tendon 09/30/2020  . Thyroid disease   . TIA (transient ischemic attack) 04/15/2018  . Tobacco use 09/28/2016    Past Surgical History:  Procedure Laterality Date  . PARTIAL HYSTERECTOMY    . right arm sx    . SHOULDER ARTHROSCOPY WITH BICEPSTENOTOMY Right 01/21/2021   Procedure: SHOULDER ARTHROSCOPY WITH BICEPSTENOTOMY;  Surgeon: Leandrew Koyanagi, MD;  Location: [place];  Service:  Orthopedics;  Laterality: Right;  . SHOULDER ARTHROSCOPY WITH DISTAL CLAVICLE RESECTION Right 01/12/2017   Procedure: RIGHT SHOULDER ARTHROSCOPY WITH  SUBACROMIAL DECOMPRESSION, DISTAL CLAVICLE EXCISION, extensive debridement;  Surgeon: Leandrew Koyanagi, MD;  Location: [place];  Service: Orthopedics;  Laterality: Right;  . THYMECTOMY  2004  . THYROIDECTOMY      There were no vitals filed for this visit.   Subjective Assessment - 02/26/21 0804    Subjective Saw Dr Erlinda Hong yesterday for L shoulder pain and had an injection which helped a bit for that left shoulder pain. Still very frustrated about continued right shoulder pain and how much she is still limited with her right arm    Pertinent History History of surgery on R shoulder x2  and elbow surgery x2.    Patient Stated Goals decrease pain, get back to PLOF    Currently in Pain? Yes    Pain Score 6     Pain Location Shoulder    Pain Orientation Right    Pain Descriptors / Indicators Aching              [facility] PT Assessment - 02/26/21 0001      PROM   Right Shoulder Flexion 90 Degrees   sig guarding, pain   Left Shoulder Flexion 110 Degrees   Active, end range pain   Left Shoulder ABduction 60 Degrees   Active, limited by pain     Palpation   Palpation comment TTP and mms tautness  throughout neck UT, LS, superior and anterior shoulder                         [facility] Adult PT Treatment/Exercise - 02/26/21 0001      Shoulder Exercises: Standing   Retraction 15 reps    Retraction Limitations at corner of doorway    Other Standing Exercises AAROM ball on table 2x10 forward  hand over hand, smaller blue ball      Shoulder Exercises: Pulleys   Flexion 3 minutes   within pain limited range     Shoulder Exercises: ROM/Strengthening   Ranger forward flexion IR/ER at neutral    Pendulum 30 seconds - max cues to prevent active/pain provoking motions      Hand Exercises   Other Hand Exercises orange stress  ball gripping 5" x 10      Wrist Exercises   Wrist Flexion Strengthening;Right;Seated;Bar weights/barbell;15 reps;10 reps    Bar Weights/Barbell (Wrist Flexion) 1 lb    Wrist Extension Strengthening;Right;Seated;Bar weights/barbell;15 reps;10 reps    Bar Weights/Barbell (Wrist Extension) 2 lbs    Wrist Radial Deviation Strengthening;Right;Seated;Bar weights/barbell;15 reps;10 reps    Bar Weights/Barbell (Radial Deviation) 1 lb      Moist Heat Therapy   Moist Heat Location Shoulder   Right     Electrical Stimulation   Electrical Stimulation Location Right shoulder (anterior, superior , lateral)    Electrical Stimulation Goals Tone;Pain                    PT Short Term Goals - 02/03/21 1341      PT SHORT TERM GOAL #1   Title Independent with initial HEP    Status Achieved             PT Long Term Goals - 01/30/21 1221      PT LONG TERM GOAL #1   Title Independent with advanced HEP    Time 8    Period Weeks    Status New    Target Date 01/30/21      PT LONG TERM GOAL #2   Title Pt will be able to return to performing some household chores and grooming utilizing R arm    Baseline unable to do household chores at eval    Time 8    Period Weeks    Status New    Target Date 03/27/21      PT LONG TERM GOAL #3   Title Pt will demo R shoulder ROM symmetrical to left side    Time 8    Period Weeks    Status New    Target Date 03/27/21      PT LONG TERM GOAL #4   Title Grip strength on the right to be greater than or equal to left to facilitate return to job duties, grasp with functional activities.    Time 8    Period Weeks    Status New    Target Date 03/27/21                 Plan - 02/26/21 5621    Clinical Impression Statement Left shoulder felt a bit better today after getting the ijection yesterday. She was able to tolerate exercises better today with continued PROM/AAROM although mms guarding continues to limit getting into end of available  ranges. Able to progress wrist/hand strengthening with increased 2 # for wrist extension today. She expresses continued nervousness about her right shoulder  pain and limitations at this time. She tolerated use of UE ranger for [facility] today all planes below 80-90 deg, plan to use ranger in subsequent sessions and progress arm elevation AA/PROM as tolerated.    Rehab Potential Good    PT Frequency 2x / week    PT Duration 8 weeks    PT Treatment/Interventions Vasopneumatic Device;Taping;Electrical Stimulation;Cryotherapy;ADLs/Self Care Home Management;Iontophoresis 4mg /ml Dexamethasone;Moist Heat;Neuromuscular re-education;Therapeutic exercise;Therapeutic activities;Functional mobility training;Patient/family education;Manual techniques;Scar mobilization    PT Next Visit Plan Slowly progress PROM/AAROM, manual and modalities as needed.    PT Home Exercise Plan Educated in use of 1 to 2 # DB with arm supported for continued wrist strengthening at home    Consulted and Agree with Plan of Care Patient           Patient will benefit from skilled therapeutic intervention in order to improve the following deficits and impairments:  Decreased range of motion,Increased fascial restricitons,Impaired UE functional use,Pain,Improper body mechanics,Impaired flexibility,Hypomobility,Decreased balance,Decreased mobility,Decreased strength,Increased edema,Postural dysfunction  Visit Diagnosis: Acute pain of right shoulder  Stiffness of right shoulder, not elsewhere classified  Cervicalgia  Chronic left shoulder pain  Stiffness of left shoulder, not elsewhere classified  Abnormal posture  Muscle weakness (generalized)     Problem List Patient Active Problem List   Diagnosis Date Noted  . Skin rash 02/17/2021  . Hypothyroidism   . Preop cardiovascular exam 11/25/2020  . Prediabetes   . Hyperlipidemia   . 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) tear of right shoulder 09/30/2020   . Rotator cuff impingement syndrome of right shoulder 09/30/2020  . 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 Aneurysm ([place]) 03/18/2020  . History of TIA (transient ischemic attack) 02/14/2020  . History of stroke   . Mixed hyperlipidemia   . Essential hypertension   . Acute pain of left shoulder 09/08/2017  . Impingement syndrome of right shoulder 12/24/2016  . Tobacco use 09/28/2016  . Glenohumeral arthritis 12/27/2014  . 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    Hall Busing, PT, DPT 02/26/2021, 8:43 AM  [place]. [place], [place], [address] Phone: [phone]   Fax:  [phone]  Name: Meagan Flores MRN: [id number] Date of Birth: [birth year]

## 2021-03-03 ENCOUNTER — Other Ambulatory Visit: Payer: Self-pay

## 2021-03-03 ENCOUNTER — Ambulatory Visit: Payer: Commercial Managed Care - PPO | Attending: Physician Assistant

## 2021-03-03 DIAGNOSIS — M6281 Muscle weakness (generalized): Secondary | ICD-10-CM

## 2021-03-03 DIAGNOSIS — G8929 Other chronic pain: Secondary | ICD-10-CM | POA: Insufficient documentation

## 2021-03-03 DIAGNOSIS — M25511 Pain in right shoulder: Secondary | ICD-10-CM

## 2021-03-03 DIAGNOSIS — M25611 Stiffness of right shoulder, not elsewhere classified: Secondary | ICD-10-CM | POA: Insufficient documentation

## 2021-03-03 DIAGNOSIS — M25612 Stiffness of left shoulder, not elsewhere classified: Secondary | ICD-10-CM | POA: Diagnosis present

## 2021-03-03 DIAGNOSIS — M542 Cervicalgia: Secondary | ICD-10-CM | POA: Diagnosis present

## 2021-03-03 DIAGNOSIS — R293 Abnormal posture: Secondary | ICD-10-CM | POA: Insufficient documentation

## 2021-03-03 DIAGNOSIS — M25512 Pain in left shoulder: Secondary | ICD-10-CM | POA: Diagnosis present

## 2021-03-03 NOTE — Therapy (Signed)
[place]. [place], [place], [address] Phone: [phone]   Fax:  [phone]  Physical Therapy Treatment Progress Note Reporting Period  01/30/2021 to 03/03/2021  See note below for Objective Data and Assessment of Progress/Goals.   Patient Details  Name: Meagan Flores MRN: [id number] Date of Birth: 1966-11-19 Referring Provider (PT): Dwana Melena PA-C for Dr. Erlinda Hong   Encounter Date: 03/03/2021   PT End of Session - 03/03/21 0940    Visit Number 9    Number of Visits 17    Date for PT Re-Evaluation 03/27/21    PT Start Time 0930    PT Stop Time 1020    PT Time Calculation (min) 50 min    Activity Tolerance Patient limited by pain;Patient tolerated treatment well    Behavior During Therapy [facility] for tasks assessed/performed           Past Medical History:  Diagnosis Date  . Aneurysm ([place]) 03/18/2020  . 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 COVID-19 virus infection 02/14/2020  . 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) tear of right shoulder 09/30/2020  . Essential hypertension   . Glenohumeral arthritis 12/27/2014  . History of stroke   . History of TIA (transient ischemic attack) 02/14/2020  . Hyperlipidemia   . Hypothyroidism   . Impingement syndrome of right shoulder 12/24/2016  . Mixed hyperlipidemia   . 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 Prediabetes   . Preop cardiovascular exam 11/25/2020  . Pulmonary hypertension, unspecified ([place]) 11/25/2020  . 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 Rotator cuff impingement syndrome of right shoulder 09/30/2020  . Stroke ([place])   . Tendinopathy of right biceps tendon 09/30/2020  . Thyroid disease   . TIA (transient ischemic attack) 04/15/2018  . Tobacco use 09/28/2016    Past Surgical History:  Procedure Laterality Date  . PARTIAL HYSTERECTOMY    . right arm sx    . SHOULDER ARTHROSCOPY WITH BICEPSTENOTOMY Right 01/21/2021    Procedure: SHOULDER ARTHROSCOPY WITH BICEPSTENOTOMY;  Surgeon: Leandrew Koyanagi, MD;  Location: [place];  Service: Orthopedics;  Laterality: Right;  . SHOULDER ARTHROSCOPY WITH DISTAL CLAVICLE RESECTION Right 01/12/2017   Procedure: RIGHT SHOULDER ARTHROSCOPY WITH  SUBACROMIAL DECOMPRESSION, DISTAL CLAVICLE EXCISION, extensive debridement;  Surgeon: Leandrew Koyanagi, MD;  Location: [place];  Service: Orthopedics;  Laterality: Right;  . THYMECTOMY  2004  . THYROIDECTOMY      There were no vitals filed for this visit.   Subjective Assessment - 03/03/21 0934    Subjective Right arm still painful, thinks she overdid it the past few days    Pertinent History History of surgery on R shoulder x2  and elbow surgery x2.    Patient Stated Goals decrease pain, get back to PLOF    Currently in Pain? Yes    Pain Score 7     Pain Location Shoulder    Pain Orientation Right    Pain Descriptors / Indicators Aching              [facility] PT Assessment - 03/03/21 0001      PROM   Right Shoulder Flexion 90 Degrees   sig guarding, pain   Right Shoulder ABduction 80 Degrees - guarding and pain   Right Shoulder External Rotation 50 Degrees - guarding and pain   Left Shoulder Flexion 110 Degrees   Active, end range pain   Left Shoulder ABduction 60 Degrees   Active, limited  by pain     Palpation   Palpation comment TTP and mms tautness throughout neck UT, LS, superior and anterior shoulder            [facility] Adult PT Treatment/Exercise - 03/03/21 0001      Shoulder Exercises: Standing   Retraction 15 reps    Other Standing Exercises AAROM ball on table 2x10 forward green ball. AAROM finger walks up wall 1 repetition to about 90 degrees and elbow suported by PT - limited by tightness and fearfulness      Shoulder Exercises: Pulleys   Flexion 3 minutes   within pain limited range     Shoulder Exercises: ROM/Strengthening   Ranger forward flexion,  IR/ER at neutral, ABD -  within pain limited ranges   Pendulum 30 seconds - max cues to prevent active/pain provoking motions      Hand Exercises   Other Hand Exercises orange stress ball gripping 5" x 10. red web finger ext and flex 10 each      Wrist Exercises (Forearm Supported)   Wrist Flexion Strengthening;Right;Seated;Bar weights/barbell;20 reps    Bar Weights/Barbell (Wrist Flexion) 1 lb    Wrist Extension Strengthening;Right;Seated;Bar weights/barbell;20 reps    Bar Weights/Barbell (Wrist Extension) 1 lb    Wrist Radial Deviation Strengthening;Right;Seated;Bar weights/barbell;20 reps    Bar Weights/Barbell (Radial Deviation) 1 lb      Moist Heat Therapy   Number Minutes Moist Heat 10 Minutes    Moist Heat Location Shoulder   Right     Electrical Stimulation   Electrical Stimulation Location Right shoulder (anterior, superior , lateral)    Electrical Stimulation Action IFC    Electrical Stimulation Parameters 10 min    Electrical Stimulation Goals Tone;Pain                    PT Short Term Goals - 02/03/21 1341      PT SHORT TERM GOAL #1   Title Independent with initial HEP    Status Achieved             PT Long Term Goals - 03/03/21 1230      PT LONG TERM GOAL #1   Title Independent with advanced HEP    Time 8    Period Weeks    Status On-going      PT LONG TERM GOAL #2   Title Pt will be able to return to performing some household chores and grooming utilizing R arm    Baseline unable to do household chores at eval    Time 8    Period Weeks    Status On-going      PT LONG TERM GOAL #3   Title Pt will demo R shoulder ROM symmetrical to left side    Time 8    Period Weeks    Status On-going   Continues to have significant pain and guarding with P and AAROM     PT LONG TERM GOAL #4   Title Grip strength on the right to be greater than or equal to left to facilitate return to job duties, grasp with functional activities.    Time 8    Period Weeks    Status On-going                  Plan - 03/03/21 1011    Clinical Impression Statement Meagan Flores is approximately 6 weeks p/o R shoulder arthroscopy with subacromial decompression, RCR and Bicep tedon Repair. Meagan Flores continues to have  alot of muscle guarding and pain in the R shoulder, with pretty consistently high pain levels , and she has expressed alot of nervousness and concern about this continued pain. She has been tolerating AAROM activites a bit better for the right shoulder within pain limited ranges but PROM continues to be significantly guarded and limited by pain. Distal arm/wrist and grip strengthening within tolerance has been going nicely.   Estim at end of session with heat applied end of session for management of pain and ms spasms with some short term relief. She is seeing Dr Erlinda Hong tomorrow for 6 week follow up p/o.    Rehab Potential Good    PT Frequency 2x / week    PT Duration 8 weeks    PT Treatment/Interventions Vasopneumatic Device;Taping;Electrical Stimulation;Cryotherapy;ADLs/Self Care Home Management;Iontophoresis 4mg /ml Dexamethasone;Moist Heat;Neuromuscular re-education;Therapeutic exercise;Therapeutic activities;Functional mobility training;Patient/family education;Manual techniques;Scar mobilization    PT Next Visit Plan Follow up regarding how Follow up with Dr Erlinda Hong went ,  modify PT POC as needed. Slowly progress PROM/AAROM, manual and modalities as needed.    PT Home Exercise Plan --    Consulted and Agree with Plan of Care Patient           Patient will benefit from skilled therapeutic intervention in order to improve the following deficits and impairments:  Decreased range of motion,Increased fascial restricitons,Impaired UE functional use,Pain,Improper body mechanics,Impaired flexibility,Hypomobility,Decreased balance,Decreased mobility,Decreased strength,Increased edema,Postural dysfunction  Visit Diagnosis: Acute pain of right shoulder  Stiffness of right shoulder, not  elsewhere classified  Cervicalgia  Chronic left shoulder pain  Stiffness of left shoulder, not elsewhere classified  Abnormal posture  Muscle weakness (generalized)     Problem List Patient Active Problem List   Diagnosis Date Noted  . Skin rash 02/17/2021  . Hypothyroidism   . Preop cardiovascular exam 11/25/2020  . Prediabetes   . Hyperlipidemia   . 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) tear of right shoulder 09/30/2020  . Rotator cuff impingement syndrome of right shoulder 09/30/2020  . 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 Aneurysm ([place]) 03/18/2020  . History of TIA (transient ischemic attack) 02/14/2020  . History of stroke   . Mixed hyperlipidemia   . Essential hypertension   . Acute pain of left shoulder 09/08/2017  . Impingement syndrome of right shoulder 12/24/2016  . Tobacco use 09/28/2016  . Glenohumeral arthritis 12/27/2014  . 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    Hall Busing, PT, DPT 03/03/2021, 12:31 PM  [place]. [place], [place], [address] Phone: [phone]   Fax:  [phone]  Name: Meagan Flores MRN: [id number] Date of Birth: 1966-09-20

## 2021-03-04 ENCOUNTER — Telehealth: Payer: Self-pay | Admitting: Orthopaedic Surgery

## 2021-03-04 ENCOUNTER — Ambulatory Visit (INDEPENDENT_AMBULATORY_CARE_PROVIDER_SITE_OTHER): Payer: Commercial Managed Care - PPO | Admitting: Orthopaedic Surgery

## 2021-03-04 ENCOUNTER — Encounter: Payer: Self-pay | Admitting: Orthopaedic Surgery

## 2021-03-04 ENCOUNTER — Ambulatory Visit: Payer: Self-pay

## 2021-03-04 DIAGNOSIS — G8929 Other chronic pain: Secondary | ICD-10-CM | POA: Diagnosis not present

## 2021-03-04 DIAGNOSIS — M25512 Pain in left shoulder: Secondary | ICD-10-CM | POA: Diagnosis not present

## 2021-03-04 DIAGNOSIS — Z9889 Other specified postprocedural states: Secondary | ICD-10-CM | POA: Diagnosis not present

## 2021-03-04 NOTE — Telephone Encounter (Signed)
[employer] did not send forms. Received request for records, which we will process timely. Thanks!

## 2021-03-04 NOTE — Progress Notes (Signed)
Office Visit Note   Patient: Meagan Flores           Date of Birth: 10-Nov-1966           MRN: [id number] Visit Date: 03/04/2021              Requested by: Elsie Stain, MD [address],  [place] [address] PCP: Elsie Stain, MD   Assessment & Plan: Visit Diagnoses:  1. Chronic left shoulder pain   2. S/P arthroscopy of right shoulder     Plan: Dayami is now 6 weeks status post right shoulder scope debridement and status post left proximal biceps cortisone injection.  For the left shoulder she is doing much better.  She is very happy with her relief so far from the injection.  In terms of right shoulder she is having trouble getting her range of motion going I think this is likely an exacerbation of the pre-existing glenohumeral chondromalacia.  I recommended a intra-articular cortisone injection today which she agreed to.  At this time I do not feel that she is ready to return back to work given how her arm is functioning therefore we will hold her out of work for another 6 weeks so that she can continue to recover and do more physical therapy.  Follow-Up Instructions: Return in about 6 weeks (around 04/15/2021).   Orders:  No orders of the defined types were placed in this encounter.  No orders of the defined types were placed in this encounter.     Procedures: No procedures performed   Clinical Data: No additional findings.   Subjective: Chief Complaint  Patient presents with  . Right Shoulder - Pain  . Left Shoulder - Pain    Ms. Groom is 6 weeks status post right shoulder scope with debridement on 01/21/2021.  She has been to about 9 sessions of outpatient physical therapy.  She continues to have pain that is significantly limiting her range of motion and use of the right arm.  She is right-hand dominant.  She has been out of work since surgery.  She is also following up for left shoulder status post biceps tendon injection under ultrasound.  She is  doing much better in that regard.  Her pain is very mild at this point.  Injection was done on 02/25/2021.   Review of Systems   Objective: Vital Signs: There were no vitals taken for this visit.  Physical Exam  Ortho Exam Right shoulder shows fully healed surgical scars.  She has mainly moderate decreased range of motion to forward flexion and abduction due to pain and guarding.  External rotation to about 45 degrees with moderate pain and guarding.  Left shoulder shows improvement in range of motion secondary to improvement in pain.  Strength is near normal to manual muscle testing. Specialty Comments:  No specialty comments available.  Imaging: No results found.   PMFS History: Patient Active Problem List   Diagnosis Date Noted  . Skin rash 02/17/2021  . Hypothyroidism   . Preop cardiovascular exam 11/25/2020  . Prediabetes   . Hyperlipidemia   . 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) tear of right shoulder 09/30/2020  . Rotator cuff impingement syndrome of right shoulder 09/30/2020  . 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 Aneurysm ([place]) 03/18/2020  . History of TIA (transient ischemic attack) 02/14/2020  . History of stroke   . Mixed hyperlipidemia   . Essential hypertension   .  Acute pain of left shoulder 09/08/2017  . Impingement syndrome of right shoulder 12/24/2016  . Tobacco use 09/28/2016  . Glenohumeral arthritis 12/27/2014  . 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   Past Medical History:  Diagnosis Date  . Aneurysm ([place]) 03/18/2020  . 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 COVID-19 virus infection 02/14/2020  . 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) tear of right shoulder 09/30/2020  . Essential hypertension   . Glenohumeral arthritis 12/27/2014  . History of stroke   . History of TIA (transient ischemic attack) 02/14/2020  . Hyperlipidemia   .  Hypothyroidism   . Impingement syndrome of right shoulder 12/24/2016  . Mixed hyperlipidemia   . 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 Prediabetes   . Preop cardiovascular exam 11/25/2020  . Pulmonary hypertension, unspecified ([place]) 11/25/2020  . 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 Rotator cuff impingement syndrome of right shoulder 09/30/2020  . Stroke ([place])   . Tendinopathy of right biceps tendon 09/30/2020  . Thyroid disease   . TIA (transient ischemic attack) 04/15/2018  . Tobacco use 09/28/2016    Family History  Problem Relation Age of Onset  . Stomach cancer Sister   . Colon polyps Sister   . Esophageal cancer Neg Hx   . Rectal cancer Neg Hx   . Colon cancer Neg Hx   . Breast cancer Neg Hx     Past Surgical History:  Procedure Laterality Date  . PARTIAL HYSTERECTOMY    . right arm sx    . SHOULDER ARTHROSCOPY WITH BICEPSTENOTOMY Right 01/21/2021   Procedure: SHOULDER ARTHROSCOPY WITH BICEPSTENOTOMY;  Surgeon: Leandrew Koyanagi, MD;  Location: [facility];  Service: Orthopedics;  Laterality: Right;  . SHOULDER ARTHROSCOPY WITH DISTAL CLAVICLE RESECTION Right 01/12/2017   Procedure: RIGHT SHOULDER ARTHROSCOPY WITH  SUBACROMIAL DECOMPRESSION, DISTAL CLAVICLE EXCISION, extensive debridement;  Surgeon: Leandrew Koyanagi, MD;  Location: [facility];  Service: Orthopedics;  Laterality: Right;  . THYMECTOMY  2004  . THYROIDECTOMY     Social History   Occupational History  . Not on file  Tobacco Use  . Smoking status: Current Every Day Smoker    Packs/day: 0.25    Years: 34.00    Pack years: 8.50  . Smokeless tobacco: Never Used  Vaping Use  . Vaping Use: Never used  Substance and Sexual Activity  . Alcohol use: Yes    Comment: occassional  . Drug use: No  . Sexual activity: Not on file

## 2021-03-04 NOTE — Telephone Encounter (Signed)
Pt called asking that we update her as soon as we get her forms being faxed from [employer].   [phone]

## 2021-03-04 NOTE — Progress Notes (Signed)
Subjective: Patient is here for ultrasound-guided intra-articular right glenohumeral injection.    Objective:  Pain with AROM.  Procedure: Ultrasound guided injection is preferred based studies that show increased duration, increased effect, greater accuracy, decreased procedural pain, increased response rate, and decreased cost with ultrasound guided versus blind injection.   Verbal informed consent obtained.  Time-out conducted.  Noted no overlying erythema, induration, or other signs of local infection. Ultrasound-guided right glenohumeral injection: After sterile prep with Betadine, injected 4 cc 0.25% bupivocaine without epinephrine and 6 mg betamethasone using a 22-gauge spinal needle, passing the needle from posterior approach into the glenohumeral joint.  Injectate seen filling joint capsule.

## 2021-03-04 NOTE — Telephone Encounter (Signed)
Do you have her forms?

## 2021-03-05 ENCOUNTER — Other Ambulatory Visit: Payer: Self-pay

## 2021-03-05 ENCOUNTER — Ambulatory Visit: Payer: Commercial Managed Care - PPO | Admitting: Physical Therapy

## 2021-03-05 DIAGNOSIS — M25511 Pain in right shoulder: Secondary | ICD-10-CM

## 2021-03-05 DIAGNOSIS — M25611 Stiffness of right shoulder, not elsewhere classified: Secondary | ICD-10-CM

## 2021-03-05 NOTE — Therapy (Signed)
[place]. [place], [place], [address] Phone: [phone]   Fax:  [phone]  Physical Therapy Treatment Progress Note Reporting Period 01/30/2021 to 03/05/2021  See note below for Objective Data and Assessment of Progress/Goals.       Patient Details  Name: Meagan Flores MRN: [id number] Date of Birth: 04-30-1966 Referring Provider (PT): Dwana Melena PA-C for Dr. Erlinda Hong   Encounter Date: 03/05/2021   PT End of Session - 03/05/21 1052    Visit Number 10    Date for PT Re-Evaluation 03/27/21    PT Start Time 3267    PT Stop Time 1110    PT Time Calculation (min) 55 min           Past Medical History:  Diagnosis Date  . Aneurysm ([place]) 03/18/2020  . 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 COVID-19 virus infection 02/14/2020  . 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) tear of right shoulder 09/30/2020  . Essential hypertension   . Glenohumeral arthritis 12/27/2014  . History of stroke   . History of TIA (transient ischemic attack) 02/14/2020  . Hyperlipidemia   . Hypothyroidism   . Impingement syndrome of right shoulder 12/24/2016  . Mixed hyperlipidemia   . 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 Prediabetes   . Preop cardiovascular exam 11/25/2020  . Pulmonary hypertension, unspecified ([place]) 11/25/2020  . 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 Rotator cuff impingement syndrome of right shoulder 09/30/2020  . Stroke ([place])   . Tendinopathy of right biceps tendon 09/30/2020  . Thyroid disease   . TIA (transient ischemic attack) 04/15/2018  . Tobacco use 09/28/2016    Past Surgical History:  Procedure Laterality Date  . PARTIAL HYSTERECTOMY    . right arm sx    . SHOULDER ARTHROSCOPY WITH BICEPSTENOTOMY Right 01/21/2021   Procedure: SHOULDER ARTHROSCOPY WITH BICEPSTENOTOMY;  Surgeon: Leandrew Koyanagi, MD;  Location: [facility];  Service: Orthopedics;  Laterality: Right;   . SHOULDER ARTHROSCOPY WITH DISTAL CLAVICLE RESECTION Right 01/12/2017   Procedure: RIGHT SHOULDER ARTHROSCOPY WITH  SUBACROMIAL DECOMPRESSION, DISTAL CLAVICLE EXCISION, extensive debridement;  Surgeon: Leandrew Koyanagi, MD;  Location: [facility];  Service: Orthopedics;  Laterality: Right;  . THYMECTOMY  2004  . THYROIDECTOMY      There were no vitals filed for this visit.   Subjective Assessment - 03/05/21 1015    Subjective saw MD and he said he thought it was going to freeze up so gave cortisone inj and some better    Currently in Pain? Yes    Pain Score 6     Pain Location Shoulder    Pain Orientation Right               [facility] Adult PT Treatment/Exercise - 03/05/21 0001      Neck Exercises: Machines for Strengthening   UBE (Upper Arm Bike) L 1 2 min fwd/ 2 min backward      Shoulder Exercises: Standing   Other Standing Exercises AAROM ball on table 2x10 forward green ball. plus CW and CCW 10 each   1# can e ex 10 each   Other Standing Exercises finger ladder flex and abd 5 x ea-PTA support   Ranger 15 x each way     Moist Heat Therapy   Number Minutes Moist Heat 12 Minutes    Moist Heat Location Shoulder      Electrical Stimulation   Electrical Stimulation Location Right shoulder (anterior, superior ,  lateral)    Electrical Stimulation Action IFC    Electrical Stimulation Parameters sitting    Electrical Stimulation Goals Pain                    PT Short Term Goals - 02/03/21 1341      PT SHORT TERM GOAL #1   Title Independent with initial HEP    Status Achieved             PT Long Term Goals - 03/03/21 1230      PT LONG TERM GOAL #1   Title Independent with advanced HEP    Time 8    Period Weeks    Status On-going      PT LONG TERM GOAL #2   Title Pt will be able to return to performing some household chores and grooming utilizing R arm    Baseline unable to do household chores at eval    Time 8    Period Weeks    Status  On-going      PT LONG TERM GOAL #3   Title Pt will demo R shoulder ROM symmetrical to left side    Time 8    Period Weeks    Status On-going   Continues to have significant pain and guarding with P and AAROM     PT LONG TERM GOAL #4   Title Grip strength on the right to be greater than or equal to left to facilitate return to job duties, grasp with functional activities.    Time 8    Period Weeks    Status On-going                 Plan - 03/05/21 1052    Clinical Impression Statement pt still in pain and fearful but verb some relief after cortisone injection. focus session on Act and AA ex to get shoulder mvmt and pt tolerated well. pt does need cuing through out and some assistance to stab and increase ROM.    PT Treatment/Interventions Vasopneumatic Device;Taping;Electrical Stimulation;Cryotherapy;ADLs/Self Care Home Management;Iontophoresis 4mg /ml Dexamethasone;Moist Heat;Neuromuscular re-education;Therapeutic exercise;Therapeutic activities;Functional mobility training;Patient/family education;Manual techniques;Scar mobilization    PT Next Visit Plan progress func ROM and strength           Patient will benefit from skilled therapeutic intervention in order to improve the following deficits and impairments:  Decreased range of motion,Increased fascial restricitons,Impaired UE functional use,Pain,Improper body mechanics,Impaired flexibility,Hypomobility,Decreased balance,Decreased mobility,Decreased strength,Increased edema,Postural dysfunction  Visit Diagnosis: Acute pain of right shoulder  Stiffness of right shoulder, not elsewhere classified     Problem List Patient Active Problem List   Diagnosis Date Noted  . Skin rash 02/17/2021  . Hypothyroidism   . Preop cardiovascular exam 11/25/2020  . Prediabetes   . Hyperlipidemia   . 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) tear of right shoulder 09/30/2020  . Rotator cuff impingement syndrome of  right shoulder 09/30/2020  . 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 Aneurysm ([place]) 03/18/2020  . History of TIA (transient ischemic attack) 02/14/2020  . History of stroke   . Mixed hyperlipidemia   . Essential hypertension   . Acute pain of left shoulder 09/08/2017  . Impingement syndrome of right shoulder 12/24/2016  . Tobacco use 09/28/2016  . Glenohumeral arthritis 12/27/2014  . 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    Deshaun Schou,[name] PTA 03/05/2021, 10:55 AM   Hall Busing, PT, DPT   [facility]  [place]. [place], [place], [address] Phone: [phone]   Fax:  [phone]  Name: KAMPBELL HOLAWAY MRN: [id number] Date of Birth: [birth year]

## 2021-03-07 ENCOUNTER — Other Ambulatory Visit: Payer: Self-pay | Admitting: Critical Care Medicine

## 2021-03-07 NOTE — Telephone Encounter (Signed)
Requested Prescriptions  Pending Prescriptions Disp Refills  . EUTHYROX 50 MCG tablet [Pharmacy Med Name: Euthyrox 50 MCG Oral Tablet] 90 tablet 2    Sig: Take 2 tablets by mouth once daily     Endocrinology:  Hypothyroid Agents Failed - 03/07/2021  1:23 AM      Failed - TSH needs to be rechecked within 3 months after an abnormal result. Refill until TSH is due.      Passed - TSH in normal range and within 360 days    TSH  Date Value Ref Range Status  10/21/2020 3.300 0.450 - 4.500 uIU/mL Final         Passed - Valid encounter within last 12 months    Recent Outpatient Visits          2 weeks ago Acute pain of left shoulder   [place], MD   2 months ago Essential hypertension   [place], MD   4 months ago Prediabetes   [place], MD   5 months ago Postoperative hypothyroidism   [place], MD   7 months ago Postoperative hypothyroidism   [place] Elsie Stain, MD      Future Appointments            In 3 weeks Park Liter, MD [facility]   In 1 month Joya Gaskins Burnett Harry, MD [place]

## 2021-03-17 ENCOUNTER — Other Ambulatory Visit: Payer: Self-pay

## 2021-03-17 ENCOUNTER — Ambulatory Visit: Payer: Commercial Managed Care - PPO

## 2021-03-17 DIAGNOSIS — M25611 Stiffness of right shoulder, not elsewhere classified: Secondary | ICD-10-CM

## 2021-03-17 DIAGNOSIS — M542 Cervicalgia: Secondary | ICD-10-CM

## 2021-03-17 DIAGNOSIS — M25511 Pain in right shoulder: Secondary | ICD-10-CM

## 2021-03-17 DIAGNOSIS — G8929 Other chronic pain: Secondary | ICD-10-CM

## 2021-03-17 DIAGNOSIS — R293 Abnormal posture: Secondary | ICD-10-CM

## 2021-03-17 DIAGNOSIS — M25512 Pain in left shoulder: Secondary | ICD-10-CM

## 2021-03-17 DIAGNOSIS — M6281 Muscle weakness (generalized): Secondary | ICD-10-CM

## 2021-03-17 DIAGNOSIS — M25612 Stiffness of left shoulder, not elsewhere classified: Secondary | ICD-10-CM

## 2021-03-17 NOTE — Therapy (Signed)
[place]. [place], [place], [address] Phone: [phone]   Fax:  [phone]  Physical Therapy Treatment  Patient Details  Name: Meagan Flores MRN: [id number] Date of Birth: 03-Apr-1966 Referring Provider (PT): Dwana Melena PA-C for Dr. Erlinda Hong   Encounter Date: 03/17/2021   PT End of Session - 03/17/21 0848    Visit Number 12    Date for PT Re-Evaluation 03/27/21    PT Start Time 0845    PT Stop Time 0930    PT Time Calculation (min) 45 min    Activity Tolerance Patient limited by pain;Patient tolerated treatment well    Behavior During Therapy [facility] for tasks assessed/performed           Past Medical History:  Diagnosis Date  . Aneurysm ([place]) 03/18/2020  . 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 COVID-19 virus infection 02/14/2020  . 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) tear of right shoulder 09/30/2020  . Essential hypertension   . Glenohumeral arthritis 12/27/2014  . History of stroke   . History of TIA (transient ischemic attack) 02/14/2020  . Hyperlipidemia   . Hypothyroidism   . Impingement syndrome of right shoulder 12/24/2016  . Mixed hyperlipidemia   . 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 Prediabetes   . Preop cardiovascular exam 11/25/2020  . Pulmonary hypertension, unspecified ([place]) 11/25/2020  . 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 Rotator cuff impingement syndrome of right shoulder 09/30/2020  . Stroke ([place])   . Tendinopathy of right biceps tendon 09/30/2020  . Thyroid disease   . TIA (transient ischemic attack) 04/15/2018  . Tobacco use 09/28/2016    Past Surgical History:  Procedure Laterality Date  . PARTIAL HYSTERECTOMY    . right arm sx    . SHOULDER ARTHROSCOPY WITH BICEPSTENOTOMY Right 01/21/2021   Procedure: SHOULDER ARTHROSCOPY WITH BICEPSTENOTOMY;  Surgeon: Leandrew Koyanagi, MD;  Location: [facility];  Service: Orthopedics;  Laterality:  Right;  . SHOULDER ARTHROSCOPY WITH DISTAL CLAVICLE RESECTION Right 01/12/2017   Procedure: RIGHT SHOULDER ARTHROSCOPY WITH  SUBACROMIAL DECOMPRESSION, DISTAL CLAVICLE EXCISION, extensive debridement;  Surgeon: Leandrew Koyanagi, MD;  Location: [facility];  Service: Orthopedics;  Laterality: Right;  . THYMECTOMY  2004  . THYROIDECTOMY      There were no vitals filed for this visit.   Subjective Assessment - 03/17/21 0846    Subjective Doing alright    Pertinent History History of surgery on R shoulder x2  and elbow surgery x2.    Patient Stated Goals decrease pain, get back to PLOF    Currently in Pain? Yes    Pain Score 5     Pain Location Shoulder    Pain Orientation Right    Pain Descriptors / Indicators Aching                [facility] Adult PT Treatment/Exercise - 03/17/21 0001      Neck Exercises: Machines for Strengthening   UBE (Upper Arm Bike) L 1 - 3 min fwd/ 3 min backward      Shoulder Exercises: Standing   Other Standing Exercises AAROM ball on table 2x10 forward green ball.  plus CW and CCW 10 each   1# cane ext x5   Other Standing Exercises finger ladder flex and abd 5 x ea-no PT support needed today   Ranger 15 x each way     Shoulder Exercises: Pulleys   Flexion 3 minutes  Moist Heat Therapy   Number Minutes Moist Heat 10 Minutes    Moist Heat Location Shoulder      Electrical Stimulation   Electrical Stimulation Location Right shoulder (anterior, superior , lateral)    Electrical Stimulation Action IFC    Electrical Stimulation Parameters sitting    Electrical Stimulation Goals Pain      Manual Therapy   Manual therapy comments Seated: PROM all planes to available end range pain limited.                    PT Short Term Goals - 02/03/21 1341      PT SHORT TERM GOAL #1   Title Independent with initial HEP    Status Achieved             PT Long Term Goals - 03/03/21 1230      PT LONG TERM GOAL #1   Title Independent  with advanced HEP    Time 8    Period Weeks    Status On-going      PT LONG TERM GOAL #2   Title Pt will be able to return to performing some household chores and grooming utilizing R arm    Baseline unable to do household chores at eval    Time 8    Period Weeks    Status On-going      PT LONG TERM GOAL #3   Title Pt will demo R shoulder ROM symmetrical to left side    Time 8    Period Weeks    Status On-going   Continues to have significant pain and guarding with P and AAROM     PT LONG TERM GOAL #4   Title Grip strength on the right to be greater than or equal to left to facilitate return to job duties, grasp with functional activities.    Time 8    Period Weeks    Status On-going                 Plan - 03/17/21 4010    Clinical Impression Statement Pt still in pain but reports since she didnt have therapy scheduled last week she was doing more exercises and moving, and observably able to move right arm without as much wincing or ms spasm pain. Focus of session on Act and AA ex to get shoulder mvmt and pt tolerated well. pt does need cuing through out and some assistance to stab and increase ROM.    PT Treatment/Interventions Vasopneumatic Device;Taping;Electrical Stimulation;Cryotherapy;ADLs/Self Care Home Management;Iontophoresis 4mg /ml Dexamethasone;Moist Heat;Neuromuscular re-education;Therapeutic exercise;Therapeutic activities;Functional mobility training;Patient/family education;Manual techniques;Scar mobilization    PT Next Visit Plan progress func ROM and strength    Consulted and Agree with Plan of Care Patient           Patient will benefit from skilled therapeutic intervention in order to improve the following deficits and impairments:  Decreased range of motion,Increased fascial restricitons,Impaired UE functional use,Pain,Improper body mechanics,Impaired flexibility,Hypomobility,Decreased balance,Decreased mobility,Decreased strength,Increased  edema,Postural dysfunction  Visit Diagnosis: Acute pain of right shoulder  Stiffness of right shoulder, not elsewhere classified  Cervicalgia  Chronic left shoulder pain  Stiffness of left shoulder, not elsewhere classified  Abnormal posture  Muscle weakness (generalized)     Problem List Patient Active Problem List   Diagnosis Date Noted  . Skin rash 02/17/2021  . Hypothyroidism   . Preop cardiovascular exam 11/25/2020  . Prediabetes   . Hyperlipidemia   . 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)  tear of right shoulder 09/30/2020  . Rotator cuff impingement syndrome of right shoulder 09/30/2020  . 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 Aneurysm ([place]) 03/18/2020  . History of TIA (transient ischemic attack) 02/14/2020  . History of stroke   . Mixed hyperlipidemia   . Essential hypertension   . Acute pain of left shoulder 09/08/2017  . Impingement syndrome of right shoulder 12/24/2016  . Tobacco use 09/28/2016  . Glenohumeral arthritis 12/27/2014  . 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    Hall Busing, PT, DPT 03/17/2021, 9:25 AM  [facility]. [place], [place], [address] Phone: [phone]   Fax:  [phone]  Name: MEGGEN SPAZIANI MRN: [id number] Date of Birth: 25-Apr-1966

## 2021-03-19 ENCOUNTER — Other Ambulatory Visit: Payer: Self-pay

## 2021-03-19 ENCOUNTER — Ambulatory Visit: Payer: Commercial Managed Care - PPO

## 2021-03-19 DIAGNOSIS — M6281 Muscle weakness (generalized): Secondary | ICD-10-CM

## 2021-03-19 DIAGNOSIS — M25511 Pain in right shoulder: Secondary | ICD-10-CM

## 2021-03-19 DIAGNOSIS — R293 Abnormal posture: Secondary | ICD-10-CM

## 2021-03-19 DIAGNOSIS — M542 Cervicalgia: Secondary | ICD-10-CM

## 2021-03-19 DIAGNOSIS — M25612 Stiffness of left shoulder, not elsewhere classified: Secondary | ICD-10-CM

## 2021-03-19 DIAGNOSIS — M25611 Stiffness of right shoulder, not elsewhere classified: Secondary | ICD-10-CM

## 2021-03-19 DIAGNOSIS — G8929 Other chronic pain: Secondary | ICD-10-CM

## 2021-03-19 NOTE — Therapy (Signed)
[place]. [place], [place], [address] Phone: [phone]   Fax:  [phone]  Physical Therapy Treatment  Patient Details  Name: Meagan Flores MRN: [id number] Date of Birth: 1966/06/24 Referring Provider (PT): Dwana Melena PA-C for Dr. Erlinda Hong   Encounter Date: 03/19/2021   PT End of Session - 03/19/21 0803    Visit Number 12    Date for PT Re-Evaluation 03/27/21    PT Start Time 0800    PT Stop Time 0845    PT Time Calculation (min) 45 min    Activity Tolerance Patient limited by pain;Patient tolerated treatment well    Behavior During Therapy [facility] for tasks assessed/performed           Past Medical History:  Diagnosis Date  . Aneurysm ([place]) 03/18/2020  . 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 COVID-19 virus infection 02/14/2020  . 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) tear of right shoulder 09/30/2020  . Essential hypertension   . Glenohumeral arthritis 12/27/2014  . History of stroke   . History of TIA (transient ischemic attack) 02/14/2020  . Hyperlipidemia   . Hypothyroidism   . Impingement syndrome of right shoulder 12/24/2016  . Mixed hyperlipidemia   . 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 Prediabetes   . Preop cardiovascular exam 11/25/2020  . Pulmonary hypertension, unspecified ([place]) 11/25/2020  . 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 Rotator cuff impingement syndrome of right shoulder 09/30/2020  . Stroke ([place])   . Tendinopathy of right biceps tendon 09/30/2020  . Thyroid disease   . TIA (transient ischemic attack) 04/15/2018  . Tobacco use 09/28/2016    Past Surgical History:  Procedure Laterality Date  . PARTIAL HYSTERECTOMY    . right arm sx    . SHOULDER ARTHROSCOPY WITH BICEPSTENOTOMY Right 01/21/2021   Procedure: SHOULDER ARTHROSCOPY WITH BICEPSTENOTOMY;  Surgeon: Leandrew Koyanagi, MD;  Location: [facility];  Service: Orthopedics;  Laterality:  Right;  . SHOULDER ARTHROSCOPY WITH DISTAL CLAVICLE RESECTION Right 01/12/2017   Procedure: RIGHT SHOULDER ARTHROSCOPY WITH  SUBACROMIAL DECOMPRESSION, DISTAL CLAVICLE EXCISION, extensive debridement;  Surgeon: Leandrew Koyanagi, MD;  Location: [facility];  Service: Orthopedics;  Laterality: Right;  . THYMECTOMY  2004  . THYROIDECTOMY      There were no vitals filed for this visit.   Subjective Assessment - 03/19/21 0802    Subjective Doing alright, still with alot of pain. Feels like injections of both arms are wearing off quickly. Seeing Dr Erlinda Hong on the 18th of May    Pertinent History History of surgery on R shoulder x2  and elbow surgery x2.    Patient Stated Goals decrease pain, get back to PLOF    Currently in Pain? Yes    Pain Score 6     Pain Location Shoulder    Pain Orientation Right;Left    Pain Descriptors / Indicators Aching              [facility] PT Assessment - 03/19/21 0835      Strength   Overall Strength Comments Grip strength 5# today on right dominant side              [facility] Adult PT Treatment/Exercise - 03/19/21 0001      Elbow Exercises   Elbow Flexion AROM;Right   3 x 10 BW only - 2 set palm up vs 1 set neutral wrist   Forearm Supination Strengthening;AROM;Right   Supination pronation with 2#  DB, x10     Neck Exercises: Machines for Strengthening   UBE (Upper Arm Bike) L 1 - 3 min fwd/ 3 min backward      Shoulder Exercises: Standing   Other Standing Exercises AAROM ball on table 2x10 forward green ball. plus CW and CCW 10 each   1# can e ex 10 each   Other Standing Exercises finger ladder flex and abd 5 x ea- PT support needed today   Ranger 15 x each way     Wrist Exercises   Other wrist exercises Grip with dynamometer.      Moist Heat Therapy   Number Minutes Moist Heat 10 Minutes    Moist Heat Location Shoulder      Electrical Stimulation   Electrical Stimulation Location Right shoulder  anterior ; right UT    Electrical Stimulation  Action Premod    Electrical Stimulation Parameters seated    Electrical Stimulation Goals Pain;Tone      Manual Therapy   Manual therapy comments Seated: PROM all planes to available end range pain limited.    Soft tissue mobilization STM right UT, LS, pecs                    PT Short Term Goals - 02/03/21 1341      PT SHORT TERM GOAL #1   Title Independent with initial HEP    Status Achieved             PT Long Term Goals - 03/03/21 1230      PT LONG TERM GOAL #1   Title Independent with advanced HEP    Time 8    Period Weeks    Status On-going      PT LONG TERM GOAL #2   Title Pt will be able to return to performing some household chores and grooming utilizing R arm    Baseline unable to do household chores at eval    Time 8    Period Weeks    Status On-going      PT LONG TERM GOAL #3   Title Pt will demo R shoulder ROM symmetrical to left side    Time 8    Period Weeks    Status On-going   Continues to have significant pain and guarding with P and AAROM     PT LONG TERM GOAL #4   Title Grip strength on the right to be greater than or equal to left to facilitate return to job duties, grasp with functional activities.    Time 8    Period Weeks    Status On-going                 Plan - 03/19/21 4268    Clinical Impression Statement Pt with increased pain today and grip reassessed today - pt with alot of difficulty with grip (decr from baseline initial eval on right dominant side) and reported difficulty due to shoulder pain. Able to complete AA finger ladder walks to increased elevations today. Alot of education provided regarding continuing to do actie assit and active ROM exercises at home as tlerted to prevent tightening of shoulder structures. Continued to progress and work on elbow ROM, wrist/grip strengthening today as well    PT Treatment/Interventions Vasopneumatic Device;Taping;Electrical Stimulation;Cryotherapy;ADLs/Self Care Home  Management;Iontophoresis 4mg /ml Dexamethasone;Moist Heat;Neuromuscular re-education;Therapeutic exercise;Therapeutic activities;Functional mobility training;Patient/family education;Manual techniques;Scar mobilization    PT Next Visit Plan progress func ROM and strength. Recert by 3/41/96    Consulted  and Agree with Plan of Care Patient           Patient will benefit from skilled therapeutic intervention in order to improve the following deficits and impairments:  Decreased range of motion,Increased fascial restricitons,Impaired UE functional use,Pain,Improper body mechanics,Impaired flexibility,Hypomobility,Decreased balance,Decreased mobility,Decreased strength,Increased edema,Postural dysfunction  Visit Diagnosis: Acute pain of right shoulder  Stiffness of right shoulder, not elsewhere classified  Cervicalgia  Chronic left shoulder pain  Stiffness of left shoulder, not elsewhere classified  Abnormal posture  Muscle weakness (generalized)     Problem List Patient Active Problem List   Diagnosis Date Noted  . Skin rash 02/17/2021  . Hypothyroidism   . Preop cardiovascular exam 11/25/2020  . Prediabetes   . Hyperlipidemia   . 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) tear of right shoulder 09/30/2020  . Rotator cuff impingement syndrome of right shoulder 09/30/2020  . 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 Aneurysm ([place]) 03/18/2020  . History of TIA (transient ischemic attack) 02/14/2020  . History of stroke   . Mixed hyperlipidemia   . Essential hypertension   . Acute pain of left shoulder 09/08/2017  . Impingement syndrome of right shoulder 12/24/2016  . Tobacco use 09/28/2016  . Glenohumeral arthritis 12/27/2014  . 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    Hall Busing, PT, DPT 03/19/2021, 8:42 AM  [place] [place]. [place], [place], [address] Phone: [phone]   Fax:  [phone]  Name: Meagan Flores MRN: [id number] Date of Birth: Mar 16, 1966

## 2021-03-24 ENCOUNTER — Other Ambulatory Visit: Payer: Self-pay

## 2021-03-24 ENCOUNTER — Ambulatory Visit: Payer: Commercial Managed Care - PPO | Admitting: Physical Therapy

## 2021-03-24 DIAGNOSIS — M6281 Muscle weakness (generalized): Secondary | ICD-10-CM

## 2021-03-24 DIAGNOSIS — M25511 Pain in right shoulder: Secondary | ICD-10-CM

## 2021-03-24 DIAGNOSIS — M25611 Stiffness of right shoulder, not elsewhere classified: Secondary | ICD-10-CM

## 2021-03-24 DIAGNOSIS — M542 Cervicalgia: Secondary | ICD-10-CM

## 2021-03-24 DIAGNOSIS — R293 Abnormal posture: Secondary | ICD-10-CM

## 2021-03-24 NOTE — Therapy (Signed)
[place]. [place], [place], [address] Phone: [phone]   Fax:  [phone]  Physical Therapy Treatment  Patient Details  Name: Meagan Flores MRN: [id number] Date of Birth: [birth year] Referring Provider (PT): Dwana Melena PA-C for Dr. Erlinda Hong   Encounter Date: 03/24/2021   PT End of Session - 03/24/21 0757    Visit Number 13    Number of Visits 17    Date for PT Re-Evaluation 03/27/21    PT Start Time 0755    PT Stop Time 0845    PT Time Calculation (min) 50 min    Activity Tolerance Patient limited by pain;Patient tolerated treatment well    Behavior During Therapy [facility] for tasks assessed/performed           Past Medical History:  Diagnosis Date  . Aneurysm ([place]) 03/18/2020  . 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 COVID-19 virus infection 02/14/2020  . 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) tear of right shoulder 09/30/2020  . Essential hypertension   . Glenohumeral arthritis 12/27/2014  . History of stroke   . History of TIA (transient ischemic attack) 02/14/2020  . Hyperlipidemia   . Hypothyroidism   . Impingement syndrome of right shoulder 12/24/2016  . Mixed hyperlipidemia   . 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 Prediabetes   . Preop cardiovascular exam 11/25/2020  . Pulmonary hypertension, unspecified ([place]) 11/25/2020  . 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 Rotator cuff impingement syndrome of right shoulder 09/30/2020  . Stroke ([place])   . Tendinopathy of right biceps tendon 09/30/2020  . Thyroid disease   . TIA (transient ischemic attack) 04/15/2018  . Tobacco use 09/28/2016    Past Surgical History:  Procedure Laterality Date  . PARTIAL HYSTERECTOMY    . right arm sx    . SHOULDER ARTHROSCOPY WITH BICEPSTENOTOMY Right 01/21/2021   Procedure: SHOULDER ARTHROSCOPY WITH BICEPSTENOTOMY;  Surgeon: Leandrew Koyanagi, MD;  Location: [place];  Service:  Orthopedics;  Laterality: Right;  . SHOULDER ARTHROSCOPY WITH DISTAL CLAVICLE RESECTION Right 01/12/2017   Procedure: RIGHT SHOULDER ARTHROSCOPY WITH  SUBACROMIAL DECOMPRESSION, DISTAL CLAVICLE EXCISION, extensive debridement;  Surgeon: Leandrew Koyanagi, MD;  Location: [place];  Service: Orthopedics;  Laterality: Right;  . THYMECTOMY  2004  . THYROIDECTOMY      There were no vitals filed for this visit.   Subjective Assessment - 03/24/21 0756    Subjective Feeling alright. Still in pain.    Pain Score 6     Pain Location Shoulder    Pain Orientation Right                             [facility] Adult PT Treatment/Exercise - 03/24/21 0001      Elbow Exercises   Elbow Flexion AROM;Strengthening;Right   2 x 10 palm up   Bar Weights/Barbell (Elbow Flexion) 1 lb    Forearm Supination Strengthening;AROM;Right   Supination pronation with 2# DB, 2x10     Neck Exercises: Machines for Strengthening   UBE (Upper Arm Bike) L2 27min fwd/bck      Neck Exercises: Theraband   Scapula Retraction 10 reps   3 sec hold at end range     Shoulder Exercises: Seated   Retraction Both;10 reps   with 3 second hold at end range   Flexion AAROM;5 reps   towel slides on table   Abduction AAROM;5 reps  towel slides on table     Shoulder Exercises: Standing   Other Standing Exercises AAROM ball on table 2x10 forward green ball. plus CW and CCW 10 each    Other Standing Exercises finger ladder flex and abd 5 x ea- 3 second hold at end range , PT support needed today      Shoulder Exercises: ROM/Strengthening   Ranger forward flexion, side to side x15 ea way      Moist Heat Therapy   Number Minutes Moist Heat 12 Minutes    Moist Heat Location Shoulder      Electrical Stimulation   Electrical Stimulation Location Right shoulder  anterior ; right UT    Electrical Stimulation Action IFC    Electrical Stimulation Parameters seated    Electrical Stimulation Goals Pain;Tone       Manual Therapy   Manual therapy comments Seated: PROM all planes to available end range pain limited.                    PT Short Term Goals - 02/03/21 1341      PT SHORT TERM GOAL #1   Title Independent with initial HEP    Status Achieved             PT Long Term Goals - 03/03/21 1230      PT LONG TERM GOAL #1   Title Independent with advanced HEP    Time 8    Period Weeks    Status On-going      PT LONG TERM GOAL #2   Title Pt will be able to return to performing some household chores and grooming utilizing R arm    Baseline unable to do household chores at eval    Time 8    Period Weeks    Status On-going      PT LONG TERM GOAL #3   Title Pt will demo R shoulder ROM symmetrical to left side    Time 8    Period Weeks    Status On-going   Continues to have significant pain and guarding with P and AAROM     PT LONG TERM GOAL #4   Title Grip strength on the right to be greater than or equal to left to facilitate return to job duties, grasp with functional activities.    Time 8    Period Weeks    Status On-going                 Plan - 03/24/21 0845    Clinical Impression Statement Pt with increased pain again today. She continues to show decreased ROM in all directions with wincing and c/o pain at end range. She stated that she was having some pain/discomfort in L shoulder which was hindering her ability to complete HEP. Educated pt on importance of continuing to do HEP as tolerated.    PT Treatment/Interventions Vasopneumatic Device;Taping;Electrical Stimulation;Cryotherapy;ADLs/Self Care Home Management;Iontophoresis 4mg /ml Dexamethasone;Moist Heat;Neuromuscular re-education;Therapeutic exercise;Therapeutic activities;Functional mobility training;Patient/family education;Manual techniques;Scar mobilization    PT Next Visit Plan continue to progress func ROM and strength. Recert by 0/27/25           Patient will benefit from skilled therapeutic  intervention in order to improve the following deficits and impairments:  Decreased range of motion,Increased fascial restricitons,Impaired UE functional use,Pain,Improper body mechanics,Impaired flexibility,Hypomobility,Decreased balance,Decreased mobility,Decreased strength,Increased edema,Postural dysfunction  Visit Diagnosis: Acute pain of right shoulder  Stiffness of right shoulder, not elsewhere classified  Cervicalgia  Muscle weakness (generalized)  Abnormal posture     Problem List Patient Active Problem List   Diagnosis Date Noted  . Skin rash 02/17/2021  . Hypothyroidism   . Preop cardiovascular exam 11/25/2020  . Prediabetes   . Hyperlipidemia   . 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) tear of right shoulder 09/30/2020  . Rotator cuff impingement syndrome of right shoulder 09/30/2020  . 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 Aneurysm ([place]) 03/18/2020  . History of TIA (transient ischemic attack) 02/14/2020  . History of stroke   . Mixed hyperlipidemia   . Essential hypertension   . Acute pain of left shoulder 09/08/2017  . Impingement syndrome of right shoulder 12/24/2016  . Tobacco use 09/28/2016  . Glenohumeral arthritis 12/27/2014  . 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    Ernst Spell, Dayle Points 03/24/2021, 8:49 AM  [place]. [place], [place], [address] Phone: [phone]   Fax:  [phone]  Name: Meagan Flores MRN: [id number] Date of Birth: [birth year]

## 2021-03-25 ENCOUNTER — Other Ambulatory Visit: Payer: Self-pay

## 2021-03-25 DIAGNOSIS — I639 Cerebral infarction, unspecified: Secondary | ICD-10-CM | POA: Insufficient documentation

## 2021-03-25 DIAGNOSIS — E079 Disorder of thyroid, unspecified: Secondary | ICD-10-CM | POA: Insufficient documentation

## 2021-03-26 ENCOUNTER — Other Ambulatory Visit: Payer: Self-pay

## 2021-03-26 ENCOUNTER — Ambulatory Visit: Payer: Commercial Managed Care - PPO

## 2021-03-26 DIAGNOSIS — R293 Abnormal posture: Secondary | ICD-10-CM

## 2021-03-26 DIAGNOSIS — M25611 Stiffness of right shoulder, not elsewhere classified: Secondary | ICD-10-CM

## 2021-03-26 DIAGNOSIS — M25612 Stiffness of left shoulder, not elsewhere classified: Secondary | ICD-10-CM

## 2021-03-26 DIAGNOSIS — G8929 Other chronic pain: Secondary | ICD-10-CM

## 2021-03-26 DIAGNOSIS — M542 Cervicalgia: Secondary | ICD-10-CM

## 2021-03-26 DIAGNOSIS — M6281 Muscle weakness (generalized): Secondary | ICD-10-CM

## 2021-03-26 DIAGNOSIS — M25511 Pain in right shoulder: Secondary | ICD-10-CM | POA: Diagnosis not present

## 2021-03-26 DIAGNOSIS — M25512 Pain in left shoulder: Secondary | ICD-10-CM

## 2021-03-26 NOTE — Therapy (Signed)
[place]. [place], [place], [address] Phone: [phone]   Fax:  [phone]  Physical Therapy Treatment/Recertification Progress Note Reporting Period 01/30/21 to 03/26/2021  See note below for Objective Data and Assessment of Progress/Goals.       Patient Details  Name: Meagan Flores MRN: [id number] Date of Birth: 01/10/1966 Referring Provider (PT): Dwana Melena PA-C for Dr. Erlinda Hong   Encounter Date: 03/26/2021   PT End of Session - 03/26/21 0755    Visit Number 14    Number of Visits --    Date for PT Re-Evaluation 05/01/21    PT Start Time 0756    PT Stop Time 0845    PT Time Calculation (min) 49 min    Activity Tolerance Patient limited by pain;Patient tolerated treatment well    Behavior During Therapy [facility] for tasks assessed/performed           Past Medical History:  Diagnosis Date  . Acute pain of left shoulder 09/08/2017  . Aneurysm ([place]) 03/18/2020  . 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 COVID-19 virus infection 02/14/2020  . 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) tear of right shoulder 09/30/2020  . Essential hypertension   . Glenohumeral arthritis 12/27/2014  . History of stroke   . History of TIA (transient ischemic attack) 02/14/2020  . Hyperlipidemia   . Hypothyroidism   . Impingement syndrome of right shoulder 12/24/2016  . Mixed hyperlipidemia   . 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 Prediabetes   . Preop cardiovascular exam 11/25/2020  . Pulmonary hypertension, unspecified ([place]) 11/25/2020  . 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 Rotator cuff impingement syndrome of right shoulder 09/30/2020  . Skin rash 02/17/2021  . Stroke ([place])   . Tendinopathy of right biceps tendon 09/30/2020  . Thyroid disease   . TIA (transient ischemic attack) 04/15/2018  . Tobacco use 09/28/2016    Past Surgical History:  Procedure Laterality Date  . PARTIAL HYSTERECTOMY     . right arm sx    . SHOULDER ARTHROSCOPY WITH BICEPSTENOTOMY Right 01/21/2021   Procedure: SHOULDER ARTHROSCOPY WITH BICEPSTENOTOMY;  Surgeon: Leandrew Koyanagi, MD;  Location: [place];  Service: Orthopedics;  Laterality: Right;  . SHOULDER ARTHROSCOPY WITH DISTAL CLAVICLE RESECTION Right 01/12/2017   Procedure: RIGHT SHOULDER ARTHROSCOPY WITH  SUBACROMIAL DECOMPRESSION, DISTAL CLAVICLE EXCISION, extensive debridement;  Surgeon: Leandrew Koyanagi, MD;  Location: [place];  Service: Orthopedics;  Laterality: Right;  . THYMECTOMY  2004  . THYROIDECTOMY      There were no vitals filed for this visit.   Subjective Assessment - 03/26/21 0840    Subjective Feeling alright. Still in pain and concerned about it. Seeing Dr Erlinda Hong on the 17th or 18th    Pertinent History History of surgery on R shoulder x2  and elbow surgery x2.    Patient Stated Goals decrease pain, get back to PLOF    Currently in Pain? Yes    Pain Score 6     Pain Location Shoulder    Pain Orientation Right    Pain Descriptors / Indicators Aching              [facility] PT Assessment - 03/26/21 0001      PROM   Right Shoulder Flexion 100 Degrees   Active assist, very limitd by pain and significant mms guarding   Right Shoulder ABduction 80 Degrees   significant guarding, pain   Right Shoulder Internal Rotation 80  Degrees    Right Shoulder External Rotation 55 Degrees   at 45 deg ABD, very limited by pain     Strength   Overall Strength Comments Grip strength 5# today on right dominant side                         [facility] Adult PT Treatment/Exercise - 03/26/21 0001      Neck Exercises: Machines for Strengthening   UBE (Upper Arm Bike) L2 67min fwd/bck      Shoulder Exercises: ROM/Strengthening   Ranger forward flexion, side to side x15 ea way   Finger ladder x 5 FF and ABD each- some PT support at elbow for tolerance     Shoulder Exercises: Stretch   Other Shoulder Stretches  Upper trap stretch      Hand Exercises   Other Hand Exercises Grip with pillowcase rolled 10 x 2      Moist Heat Therapy   Number Minutes Moist Heat 10 Minutes    Moist Heat Location Shoulder      Electrical Stimulation   Electrical Stimulation Location Right shoulder  anterior ; right UT    Electrical Stimulation Action premod    Electrical Stimulation Parameters seated    Electrical Stimulation Goals Pain;Tone      Manual Therapy   Manual therapy comments Seated vs supine: PROM all planes to available end range pain limited.                    PT Short Term Goals - 02/03/21 1341      PT SHORT TERM GOAL #1   Title Independent with initial HEP    Status Achieved             PT Long Term Goals - 03/03/21 1230      PT LONG TERM GOAL #1   Title Independent with advanced HEP    Time 8    Period Weeks    Status On-going      PT LONG TERM GOAL #2   Title Pt will be able to return to performing some household chores and grooming utilizing R arm    Baseline unable to do household chores at eval    Time 8    Period Weeks    Status On-going      PT LONG TERM GOAL #3   Title Pt will demo R shoulder ROM symmetrical to left side    Time 8    Period Weeks    Status On-going   Continues to have significant pain and guarding with P and AAROM     PT LONG TERM GOAL #4   Title Grip strength on the right to be greater than or equal to left to facilitate return to job duties, grasp with functional activities.    Time 8    Period Weeks    Status On-going                 Plan - 03/26/21 0842    Clinical Impression Statement Meagan Flores is about 9 weeks p/o r shoulder srthroscopy. She continues to have consistent right shoulder pain and continues to share alot of concern, especially about her ability to get back to work. She continues to show decreased ROM in all directions with wincing and c/o pain at end range. ROM continues to be limited although making very gradual  progress with AAROM. Most limited in flexion, abduction and ER and has low  tolerance to increasing any end range stretches d/t pain.  Grip strength has also decreased, likely associated with not using arm as much functionally d/t pain. She also continues to have some some pain/discomfort in L shoulder. Educated pt on importance of continuing to do HEP as tolerated. She will benefit from continued skilled physcial therapy to work on improving functional UE use and ROM.    Rehab Potential Good    PT Frequency 2x / week    PT Duration 4 weeks    PT Treatment/Interventions Vasopneumatic Device;Taping;Electrical Stimulation;Cryotherapy;ADLs/Self Care Home Management;Iontophoresis 4mg /ml Dexamethasone;Moist Heat;Neuromuscular re-education;Therapeutic exercise;Therapeutic activities;Functional mobility training;Patient/family education;Manual techniques;Scar mobilization    PT Next Visit Plan continue to progress func ROM and strength.    Consulted and Agree with Plan of Care Patient           Patient will benefit from skilled therapeutic intervention in order to improve the following deficits and impairments:  Decreased range of motion,Increased fascial restricitons,Impaired UE functional use,Pain,Improper body mechanics,Impaired flexibility,Hypomobility,Decreased balance,Decreased mobility,Decreased strength,Increased edema,Postural dysfunction  Visit Diagnosis: Acute pain of right shoulder  Stiffness of right shoulder, not elsewhere classified  Cervicalgia  Muscle weakness (generalized)  Abnormal posture  Chronic left shoulder pain  Stiffness of left shoulder, not elsewhere classified     Problem List Patient Active Problem List   Diagnosis Date Noted  . Thyroid disease   . Stroke ([place])   . Skin rash 02/17/2021  . Hypothyroidism   . Preop cardiovascular exam 11/25/2020  . Pulmonary hypertension, unspecified ([place]) 11/25/2020  . Prediabetes   . Hyperlipidemia   . Degenerative  superior labral anterior-to-posterior (SLAP) tear of right shoulder 09/30/2020  . Rotator cuff impingement syndrome of right shoulder 09/30/2020  . 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 Tendinopathy of right biceps tendon 09/30/2020  . 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 Aneurysm ([place]) 03/18/2020  . History of TIA (transient ischemic attack) 02/14/2020  . COVID-19 virus infection 02/14/2020  . TIA (transient ischemic attack) 04/15/2018  . History of stroke   . Mixed hyperlipidemia   . Essential hypertension   . Acute pain of left shoulder 09/08/2017  . Impingement syndrome of right shoulder 12/24/2016  . Tobacco use 09/28/2016  . Glenohumeral arthritis 12/27/2014  . 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    Hall Busing, PT, DPT 03/26/2021, 11:46 AM  [place]. [place], [place], [address] Phone: [phone]   Fax:  [phone]  Name: Meagan Flores MRN: [id number] Date of Birth: 1966-01-10

## 2021-03-31 ENCOUNTER — Ambulatory Visit: Payer: Commercial Managed Care - PPO | Attending: Physician Assistant | Admitting: Physical Therapy

## 2021-03-31 ENCOUNTER — Other Ambulatory Visit: Payer: Self-pay

## 2021-03-31 DIAGNOSIS — R293 Abnormal posture: Secondary | ICD-10-CM | POA: Diagnosis present

## 2021-03-31 DIAGNOSIS — M6281 Muscle weakness (generalized): Secondary | ICD-10-CM | POA: Diagnosis present

## 2021-03-31 DIAGNOSIS — M542 Cervicalgia: Secondary | ICD-10-CM

## 2021-03-31 DIAGNOSIS — G8929 Other chronic pain: Secondary | ICD-10-CM | POA: Insufficient documentation

## 2021-03-31 DIAGNOSIS — M25511 Pain in right shoulder: Secondary | ICD-10-CM

## 2021-03-31 DIAGNOSIS — M25512 Pain in left shoulder: Secondary | ICD-10-CM | POA: Diagnosis present

## 2021-03-31 DIAGNOSIS — M25612 Stiffness of left shoulder, not elsewhere classified: Secondary | ICD-10-CM | POA: Insufficient documentation

## 2021-03-31 DIAGNOSIS — M25611 Stiffness of right shoulder, not elsewhere classified: Secondary | ICD-10-CM | POA: Insufficient documentation

## 2021-03-31 NOTE — Therapy (Signed)
[place]. [place], [place], [address] Phone: [phone]   Fax:  [phone]  Physical Therapy Treatment  Patient Details  Name: Meagan Flores MRN: [id number] Date of Birth: 30-Mar-1966 Referring Provider (PT): Dwana Melena PA-C for Dr. Erlinda Hong   Encounter Date: 03/31/2021   PT End of Session - 03/31/21 0845    Visit Number 15    Number of Visits 17    Date for PT Re-Evaluation 05/01/21    PT Start Time 0842    PT Stop Time 0931    PT Time Calculation (min) 49 min    Activity Tolerance Patient limited by pain;Patient tolerated treatment well    Behavior During Therapy [facility] for tasks assessed/performed           Past Medical History:  Diagnosis Date  . Acute pain of left shoulder 09/08/2017  . Aneurysm ([place]) 03/18/2020  . 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 COVID-19 virus infection 02/14/2020  . 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) tear of right shoulder 09/30/2020  . Essential hypertension   . Glenohumeral arthritis 12/27/2014  . History of stroke   . History of TIA (transient ischemic attack) 02/14/2020  . Hyperlipidemia   . Hypothyroidism   . Impingement syndrome of right shoulder 12/24/2016  . Mixed hyperlipidemia   . 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 Prediabetes   . Preop cardiovascular exam 11/25/2020  . Pulmonary hypertension, unspecified ([place]) 11/25/2020  . 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 Rotator cuff impingement syndrome of right shoulder 09/30/2020  . Skin rash 02/17/2021  . Stroke ([place])   . Tendinopathy of right biceps tendon 09/30/2020  . Thyroid disease   . TIA (transient ischemic attack) 04/15/2018  . Tobacco use 09/28/2016    Past Surgical History:  Procedure Laterality Date  . PARTIAL HYSTERECTOMY    . right arm sx    . SHOULDER ARTHROSCOPY WITH BICEPSTENOTOMY Right 01/21/2021   Procedure: SHOULDER ARTHROSCOPY WITH BICEPSTENOTOMY;  Surgeon:  Leandrew Koyanagi, MD;  Location: [facility];  Service: Orthopedics;  Laterality: Right;  . SHOULDER ARTHROSCOPY WITH DISTAL CLAVICLE RESECTION Right 01/12/2017   Procedure: RIGHT SHOULDER ARTHROSCOPY WITH  SUBACROMIAL DECOMPRESSION, DISTAL CLAVICLE EXCISION, extensive debridement;  Surgeon: Leandrew Koyanagi, MD;  Location: [place];  Service: Orthopedics;  Laterality: Right;  . THYMECTOMY  2004  . THYROIDECTOMY      There were no vitals filed for this visit.   Subjective Assessment - 03/31/21 0844    Subjective Pt reports her arm hurts today.    Currently in Pain? Yes    Pain Score 6     Pain Location Arm    Pain Orientation Right                             [facility] Adult PT Treatment/Exercise - 03/31/21 0001      Neck Exercises: Machines for Strengthening   UBE (Upper Arm Bike) L2 64fwd/3bck      Shoulder Exercises: Seated   Other Seated Exercises rows 5# x10, lats 15# 2x5      Shoulder Exercises: Standing   External Rotation Strengthening;Both;5 reps   2 sets   Theraband Level (Shoulder External Rotation) Level 1 (Yellow)    Extension Strengthening;Both;10 reps   2 sets   Theraband Level (Shoulder Extension) Level 2 (Red)      Shoulder Exercises: ROM/Strengthening   Other ROM/Strengthening Exercises weighted bar  flex, ext, IR up back x10 ea way   limited ROM in all directions d/t c/o pain   Other ROM/Strengthening Exercises Nustep L1 standing beside w/ R arm ROM only,3 x 60 secs, towel slides up wall R flex/abd      Moist Heat Therapy   Number Minutes Moist Heat 10 Minutes    Moist Heat Location Shoulder      Electrical Stimulation   Electrical Stimulation Location Right shoulder  anterior ; right UT    Electrical Stimulation Action IFC    Electrical Stimulation Parameters seated    Electrical Stimulation Goals Pain;Tone                    PT Short Term Goals - 02/03/21 1341      PT SHORT TERM GOAL #1   Title  Independent with initial HEP    Status Achieved             PT Long Term Goals - 03/03/21 1230      PT LONG TERM GOAL #1   Title Independent with advanced HEP    Time 8    Period Weeks    Status On-going      PT LONG TERM GOAL #2   Title Pt will be able to return to performing some household chores and grooming utilizing R arm    Baseline unable to do household chores at eval    Time 8    Period Weeks    Status On-going      PT LONG TERM GOAL #3   Title Pt will demo R shoulder ROM symmetrical to left side    Time 8    Period Weeks    Status On-going   Continues to have significant pain and guarding with P and AAROM     PT LONG TERM GOAL #4   Title Grip strength on the right to be greater than or equal to left to facilitate return to job duties, grasp with functional activities.    Time 8    Period Weeks    Status On-going                 Plan - 03/31/21 7564    Clinical Impression Statement Pt tolerated progression of treatment fair today. She was able to complete all ther ex increasing to weight machines and standing theraband ex. Decreased ROM during standing shoulder flexion with wand d/t c/o of pain. She reported increase pain and tightness during ROM towel slidess on wall, and limited ROM and wincing was noted. She tolerated E-stim and heat very well at end of session with reports of less pain.    PT Treatment/Interventions Vasopneumatic Device;Taping;Electrical Stimulation;Cryotherapy;ADLs/Self Care Home Management;Iontophoresis 4mg /ml Dexamethasone;Moist Heat;Neuromuscular re-education;Therapeutic exercise;Therapeutic activities;Functional mobility training;Patient/family education;Manual techniques;Scar mobilization    PT Next Visit Plan continue to progress functional  ROM and strength.           Patient will benefit from skilled therapeutic intervention in order to improve the following deficits and impairments:  Decreased range of motion,Increased  fascial restricitons,Impaired UE functional use,Pain,Improper body mechanics,Impaired flexibility,Hypomobility,Decreased balance,Decreased mobility,Decreased strength,Increased edema,Postural dysfunction  Visit Diagnosis: Acute pain of right shoulder  Stiffness of right shoulder, not elsewhere classified  Cervicalgia  Muscle weakness (generalized)  Abnormal posture     Problem List Patient Active Problem List   Diagnosis Date Noted  . Thyroid disease   . Stroke ([place])   . Skin rash 02/17/2021  . Hypothyroidism   . Preop  cardiovascular exam 11/25/2020  . Pulmonary hypertension, unspecified ([place]) 11/25/2020  . Prediabetes   . Hyperlipidemia   . 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) tear of right shoulder 09/30/2020  . Rotator cuff impingement syndrome of right shoulder 09/30/2020  . 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 Tendinopathy of right biceps tendon 09/30/2020  . 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 Aneurysm ([place]) 03/18/2020  . History of TIA (transient ischemic attack) 02/14/2020  . COVID-19 virus infection 02/14/2020  . TIA (transient ischemic attack) 04/15/2018  . History of stroke   . Mixed hyperlipidemia   . Essential hypertension   . Acute pain of left shoulder 09/08/2017  . Impingement syndrome of right shoulder 12/24/2016  . Tobacco use 09/28/2016  . Glenohumeral arthritis 12/27/2014  . 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    Ernst Spell, Dayle Points 03/31/2021, 9:28 AM  [place]. [place], [place], [address] Phone: [phone]   Fax:  [phone]  Name: Meagan Flores MRN: [id number] Date of Birth: 25-Sep-1966

## 2021-04-01 ENCOUNTER — Encounter: Payer: Self-pay | Admitting: Cardiology

## 2021-04-01 ENCOUNTER — Ambulatory Visit (INDEPENDENT_AMBULATORY_CARE_PROVIDER_SITE_OTHER): Payer: Commercial Managed Care - PPO | Admitting: Cardiology

## 2021-04-01 ENCOUNTER — Ambulatory Visit (INDEPENDENT_AMBULATORY_CARE_PROVIDER_SITE_OTHER): Payer: Commercial Managed Care - PPO

## 2021-04-01 VITALS — BP 126/78 | HR 75 | Ht 68.0 in | Wt 210.0 lb

## 2021-04-01 DIAGNOSIS — I729 Aneurysm of unspecified site: Secondary | ICD-10-CM | POA: Diagnosis not present

## 2021-04-01 DIAGNOSIS — R002 Palpitations: Secondary | ICD-10-CM | POA: Diagnosis not present

## 2021-04-01 DIAGNOSIS — I272 Pulmonary hypertension, unspecified: Secondary | ICD-10-CM

## 2021-04-01 DIAGNOSIS — Z720 Tobacco use: Secondary | ICD-10-CM | POA: Diagnosis not present

## 2021-04-01 DIAGNOSIS — I10 Essential (primary) hypertension: Secondary | ICD-10-CM

## 2021-04-01 DIAGNOSIS — Z8673 Personal history of transient ischemic attack (TIA), and cerebral infarction without residual deficits: Secondary | ICD-10-CM

## 2021-04-01 NOTE — Progress Notes (Signed)
Cardiology Office Note:    Date:  04/01/2021   ID:  Meagan Flores, DOB 08/23/1966, MRN [id number]  PCP:  Elsie Stain, MD  Cardiologist:  Jenne Campus, MD    Referring MD: Elsie Stain, MD   Chief Complaint  Patient presents with  . Follow-up    History of Present Illness:    Meagan Flores is a 55 y.o. female who was referred to [place] because of CT of the chest showing possibility of enlargement of the aorta as well as enlargement of the pulmonary artery raising suspicion for pulmonary hypertension.  However, echocardiogram after that did not show significant pulmonary hypertension, on top of that she does not have any signs and symptoms of pulmonary hypertension.  There is no shortness of breath there is no swelling of lower extremities there is no hepatomegaly there is no JVD.  She is a smoker and continues to smoke, also got essential hypertension. Comes today 2 months of follow-up overall doing well still asymptomatic.  But she complained of having some palpitations.  She described episode that her heart can start speeding up speeds up for few minutes it typically happen about twice a week does not get dizzy does not get sweaty when it happens.  But she is concerning about it.  Past Medical History:  Diagnosis Date  . Acute pain of left shoulder 09/08/2017  . Aneurysm ([place]) 03/18/2020  . 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 COVID-19 virus infection 02/14/2020  . 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) tear of right shoulder 09/30/2020  . Essential hypertension   . Glenohumeral arthritis 12/27/2014  . History of stroke   . History of TIA (transient ischemic attack) 02/14/2020  . Hyperlipidemia   . Hypothyroidism   . Impingement syndrome of right shoulder 12/24/2016  . Mixed hyperlipidemia   . 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 Prediabetes   . Preop cardiovascular exam 11/25/2020  . Pulmonary hypertension, unspecified ([place])  11/25/2020  . 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 Rotator cuff impingement syndrome of right shoulder 09/30/2020  . Skin rash 02/17/2021  . Stroke ([place])   . Tendinopathy of right biceps tendon 09/30/2020  . Thyroid disease   . TIA (transient ischemic attack) 04/15/2018  . Tobacco use 09/28/2016    Past Surgical History:  Procedure Laterality Date  . PARTIAL HYSTERECTOMY    . right arm sx    . SHOULDER ARTHROSCOPY WITH BICEPSTENOTOMY Right 01/21/2021   Procedure: SHOULDER ARTHROSCOPY WITH BICEPSTENOTOMY;  Surgeon: Leandrew Koyanagi, MD;  Location: [facility];  Service: Orthopedics;  Laterality: Right;  . SHOULDER ARTHROSCOPY WITH DISTAL CLAVICLE RESECTION Right 01/12/2017   Procedure: RIGHT SHOULDER ARTHROSCOPY WITH  SUBACROMIAL DECOMPRESSION, DISTAL CLAVICLE EXCISION, extensive debridement;  Surgeon: Leandrew Koyanagi, MD;  Location: [facility];  Service: Orthopedics;  Laterality: Right;  . THYMECTOMY  2004  . THYROIDECTOMY      Current Medications: Current Meds  Medication Sig  . aspirin EC 81 MG tablet Take 1 tablet (81 mg total) by mouth daily.  Marland Kitchen atorvastatin (LIPITOR) 40 MG tablet Take 1 tablet (40 mg total) by mouth daily at 6 PM.  . clobetasol cream (TEMOVATE) 9.62 % Apply 1 application topically 2 (two) times daily.  Arna Medici 50 MCG tablet Take 2 tablets by mouth once daily (Patient taking differently: Take 50 mcg by mouth daily before breakfast.)  . HYDROcodone-acetaminophen (NORCO) 5-325 MG tablet Take 1-2 tablets by mouth daily as needed. (Patient taking differently: Take  1-2 tablets by mouth daily as needed for moderate pain or severe pain.)  . [DISCONTINUED] valsartan (DIOVAN) 160 MG tablet Take 160 mg by mouth daily.     Allergies:   Sulfa antibiotics and Valsartan   Social History   Socioeconomic History  . Marital status: Married    Spouse name: Not on file  . Number of children: Not on file  . Years of education: Not on file  . Highest  education level: Not on file  Occupational History  . Not on file  Tobacco Use  . Smoking status: Current Every Day Smoker    Packs/day: 0.25    Years: 34.00    Pack years: 8.50  . Smokeless tobacco: Never Used  Vaping Use  . Vaping Use: Never used  Substance and Sexual Activity  . Alcohol use: Yes    Comment: occassional  . Drug use: No  . Sexual activity: Not on file  Other Topics Concern  . Not on file  Social History Narrative  . Not on file   Social Determinants of Health   Financial Resource Strain: Not on file  Food Insecurity: Not on file  Transportation Needs: Not on file  Physical Activity: Not on file  Stress: Not on file  Social Connections: Not on file     Family History: The patient's family history includes Colon polyps in her sister; Stomach cancer in her sister. There is no history of Esophageal cancer, Rectal cancer, Colon cancer, or Breast cancer. ROS:   Please see the history of present illness.    All 14 point review of systems negative except as described per history of present illness  EKGs/Labs/Other Studies Reviewed:      Recent Labs: 10/21/2020: TSH 3.300 11/04/2020: ALT 13; BUN 15; Creatinine, Ser 0.78; Hemoglobin 11.7; Platelets 233; Potassium 3.5; Sodium 140  Recent Lipid Panel    Component Value Date/Time   CHOL 203 (H) 04/15/2018 0058   CHOL 214 (H) 08/05/2017 1645   TRIG 140 04/15/2018 0058   HDL 45 04/15/2018 0058   HDL 51 08/05/2017 1645   CHOLHDL 4.5 04/15/2018 0058   VLDL 28 04/15/2018 0058   LDLCALC 130 (H) 04/15/2018 0058   LDLCALC 145 (H) 08/05/2017 1645    Physical Exam:    VS:  BP 126/78 (BP Location: Right Arm, Patient Position: Sitting)   Pulse 75   Ht 5\' 8"  (1.727 m)   Wt 210 lb (95.3 kg)   SpO2 97%   BMI 31.93 kg/m     Wt Readings from Last 3 Encounters:  04/01/21 210 lb (95.3 kg)  02/17/21 202 lb 3.2 oz (91.7 kg)  01/21/21 197 lb 12 oz (89.7 kg)     GEN:  Well nourished, well developed in no acute  distress HEENT: Normal NECK: No JVD; No carotid bruits LYMPHATICS: No lymphadenopathy CARDIAC: RRR, no murmurs, no rubs, no gallops RESPIRATORY:  Clear to auscultation without rales, wheezing or rhonchi  ABDOMEN: Soft, non-tender, non-distended MUSCULOSKELETAL:  No edema; No deformity  SKIN: Warm and dry LOWER EXTREMITIES: no swelling NEUROLOGIC:  Alert and oriented x 3 PSYCHIATRIC:  Normal affect   ASSESSMENT:    1. Essential hypertension   2. Aneurysm ([place])   3. Pulmonary hypertension, unspecified ([place])   4. Tobacco use   5. History of stroke    PLAN:    In order of problems listed above:  1. Essential hypertension, blood pressure well controlled we will continue present management. 2. Palpitations which is new diagnosis.  I will ask her to wear Zio patch to make sure he understand what kind of arrhythmia with dealing with. 3. Questions about pulmonary hypertension but last echocardiogram showed normal pulmonary pressure without significant TR.  Will repeat echocardiogram within the next few months. 4. Tobacco abuse has been of this 5 minutes talking about it and strongly recommend to quit. 5. History of CVA.  Stable followed by antimedicine team.  I did review K PN for cholesterol last cholesterol I see LDL of 130 HDL 45 this is from May 2019.  Will call primary care physician to get the updated information's  Medication Adjustments/Labs and Tests Ordered: Current medicines are reviewed at length with the patient today.  Concerns regarding medicines are outlined above.  No orders of the defined types were placed in this encounter.  Medication changes: No orders of the defined types were placed in this encounter.   Signed, Park Liter, MD, [facility] 04/01/2021 1:45 PM    [place]

## 2021-04-01 NOTE — Patient Instructions (Signed)
Medication Instructions:  Your physician recommends that you continue on your current medications as directed. Please refer to the Current Medication list given to you today.  *If you need a refill on your cardiac medications before your next appointment, please call your pharmacy*   Lab Work: None If you have labs (blood work) drawn today and your tests are completely normal, you will receive your results only by: Marland Kitchen MyChart Message (if you have MyChart) OR . A paper copy in the mail If you have any lab test that is abnormal or we need to change your treatment, we will call you to review the results.   Testing/Procedures: A zio monitor was ordered today. It will remain on for 7 days. You will then return monitor and event diary in provided box. It takes 1-2 weeks for report to be downloaded and returned to [place]. We will call you with the results. If monitor falls off or has orange flashing light, please call Zio for further instructions.      Follow-Up: At [facility], you and your health needs are our priority.  As part of our continuing mission to provide you with exceptional heart care, we have created designated Provider Care Teams.  These Care Teams include your primary Cardiologist (physician) and Advanced Practice Providers (APPs -  Physician Assistants and Nurse Practitioners) who all work together to provide you with the care you need, when you need it.  We recommend signing up for the patient portal called "MyChart".  Sign up information is provided on this After Visit Summary.  MyChart is used to connect with patients for Virtual Visits (Telemedicine).  Patients are able to view lab/test results, encounter notes, upcoming appointments, etc.  Non-urgent messages can be sent to your provider as well.   To learn more about what you can do with MyChart, go to [email or web address].    Your next appointment:   5 month(s)  The format for your next appointment:   In  Person  Provider:   Jenne Campus, MD   Other Instructions

## 2021-04-02 ENCOUNTER — Telehealth: Payer: Self-pay | Admitting: Cardiology

## 2021-04-02 NOTE — Telephone Encounter (Signed)
Patient would like to know if it is okay for her to do her physical therapy, while wearing the heart monitor.

## 2021-04-03 ENCOUNTER — Other Ambulatory Visit: Payer: Self-pay

## 2021-04-03 ENCOUNTER — Ambulatory Visit: Payer: Commercial Managed Care - PPO | Admitting: Physical Therapy

## 2021-04-03 DIAGNOSIS — M25611 Stiffness of right shoulder, not elsewhere classified: Secondary | ICD-10-CM

## 2021-04-03 DIAGNOSIS — M25511 Pain in right shoulder: Secondary | ICD-10-CM

## 2021-04-03 DIAGNOSIS — M6281 Muscle weakness (generalized): Secondary | ICD-10-CM

## 2021-04-03 NOTE — Therapy (Signed)
[facility] [place], [place], [address] Phone: [phone]   Fax:  [phone]  Physical Therapy Treatment  Patient Details  Name: Meagan Flores MRN: [id number] Date of Birth: 09-17-1966 Referring Provider (PT): Dwana Melena PA-C for Dr. Erlinda Hong   Encounter Date: 04/03/2021   PT End of Session - 04/03/21 0840    Visit Number 16    Date for PT Re-Evaluation 05/01/21    PT Start Time 0802    PT Stop Time 0842    PT Time Calculation (min) 40 min           Past Medical History:  Diagnosis Date  . Acute pain of left shoulder 09/08/2017  . Aneurysm ([place]) 03/18/2020  . 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 COVID-19 virus infection 02/14/2020  . 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) tear of right shoulder 09/30/2020  . Essential hypertension   . Glenohumeral arthritis 12/27/2014  . History of stroke   . History of TIA (transient ischemic attack) 02/14/2020  . Hyperlipidemia   . Hypothyroidism   . Impingement syndrome of right shoulder 12/24/2016  . Mixed hyperlipidemia   . 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 Prediabetes   . Preop cardiovascular exam 11/25/2020  . Pulmonary hypertension, unspecified ([place]) 11/25/2020  . 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 Rotator cuff impingement syndrome of right shoulder 09/30/2020  . Skin rash 02/17/2021  . Stroke ([place])   . Tendinopathy of right biceps tendon 09/30/2020  . Thyroid disease   . TIA (transient ischemic attack) 04/15/2018  . Tobacco use 09/28/2016    Past Surgical History:  Procedure Laterality Date  . PARTIAL HYSTERECTOMY    . right arm sx    . SHOULDER ARTHROSCOPY WITH BICEPSTENOTOMY Right 01/21/2021   Procedure: SHOULDER ARTHROSCOPY WITH BICEPSTENOTOMY;  Surgeon: Leandrew Koyanagi, MD;  Location: [facility];  Service: Orthopedics;  Laterality: Right;  . SHOULDER ARTHROSCOPY WITH DISTAL CLAVICLE RESECTION Right  01/12/2017   Procedure: RIGHT SHOULDER ARTHROSCOPY WITH  SUBACROMIAL DECOMPRESSION, DISTAL CLAVICLE EXCISION, extensive debridement;  Surgeon: Leandrew Koyanagi, MD;  Location: [facility];  Service: Orthopedics;  Laterality: Right;  . THYMECTOMY  2004  . THYROIDECTOMY      There were no vitals filed for this visit.   Subjective Assessment - 04/03/21 0805    Subjective I am wearing a heart monitor. Pain increases with certain mvmt ( demo ER and abd)    Currently in Pain? Yes    Pain Score 5     Pain Location Shoulder    Pain Orientation Right              [facility] PT Assessment - 04/03/21 0001      AROM   AROM Assessment Site Shoulder    Right/Left Shoulder Right    Right Shoulder Flexion 113 Degrees    Right Shoulder ABduction 75 Degrees   c/o catch   Right Shoulder Internal Rotation 70 Degrees    Right Shoulder External Rotation 65 Degrees                         [facility] Adult PT Treatment/Exercise - 04/03/21 0001      Neck Exercises: Machines for Strengthening   UBE (Upper Arm Bike) L2 50fwd/3bck    Nustep L 3 push /pull 2 min and then SW 2 min      Shoulder Exercises: Standing   External Rotation Strengthening;Right;10 reps;Theraband  Theraband Level (Shoulder External Rotation) Level 1 (Yellow)    Internal Rotation Strengthening;Right;10 reps;Theraband    Theraband Level (Shoulder Internal Rotation) Level 1 (Yellow)    Extension Strengthening;Both;10 reps;Theraband    Theraband Level (Shoulder Extension) Level 2 (Red)    Row Strengthening;Both;10 reps;Theraband    Theraband Level (Shoulder Row) Level 2 (Red)    Other Standing Exercises red tband bicep curl 10 x red tband   2# IR/ER 10x   Other Standing Exercises cane ex 10 x 2# chest press, flex,IR and ext      Shoulder Exercises: ROM/Strengthening   Lat Pull 10 reps   15# for ROM PTA assit to pull down   Cybex Row 20 reps;10 reps   10#, cuing to relax shlds     Manual Therapy   Manual  Therapy Passive ROM    Passive ROM RT shld- very good PROM                    PT Short Term Goals - 02/03/21 1341      PT SHORT TERM GOAL #1   Title Independent with initial HEP    Status Achieved             PT Long Term Goals - 04/03/21 0839      PT LONG TERM GOAL #1   Title Independent with advanced HEP    Status Partially Met      PT LONG TERM GOAL #2   Title Pt will be able to return to performing some household chores and grooming utilizing R arm    Status On-going      PT LONG TERM GOAL #3   Title Pt will demo R shoulder ROM symmetrical to left side    Status On-going      PT LONG TERM GOAL #4   Title Grip strength on the right to be greater than or equal to left to facilitate return to job duties, grasp with functional activities.    Status On-going                 Plan - 04/03/21 0840    Clinical Impression Statement increased toelrance to ther ex, cuing needed to relax and increase ROM. pt very frustrated and pain focusd. AROM better then AAROm take last time and PROM is very good so explained to pt that is improvement and as strength comes hopefully pain wll decrease.    PT Treatment/Interventions Vasopneumatic Device;Taping;Electrical Stimulation;Cryotherapy;ADLs/Self Care Home Management;Iontophoresis 4mg /ml Dexamethasone;Moist Heat;Neuromuscular re-education;Therapeutic exercise;Therapeutic activities;Functional mobility training;Patient/family education;Manual techniques;Scar mobilization    PT Next Visit Plan continue to progress functional  ROM and strength.           Patient will benefit from skilled therapeutic intervention in order to improve the following deficits and impairments:  Decreased range of motion,Increased fascial restricitons,Impaired UE functional use,Pain,Improper body mechanics,Impaired flexibility,Hypomobility,Decreased balance,Decreased mobility,Decreased strength,Increased edema,Postural dysfunction  Visit  Diagnosis: Acute pain of right shoulder  Stiffness of right shoulder, not elsewhere classified  Muscle weakness (generalized)     Problem List Patient Active Problem List   Diagnosis Date Noted  . Thyroid disease   . Stroke ([place])   . Skin rash 02/17/2021  . Hypothyroidism   . Preop cardiovascular exam 11/25/2020  . Pulmonary hypertension, unspecified ([place]) 11/25/2020  . Prediabetes   . Hyperlipidemia   . 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) tear of right shoulder 09/30/2020  . Rotator cuff impingement syndrome of right shoulder 09/30/2020  . Arthrosis of  right acromioclavicular joint 09/30/2020  . Tendinopathy of right biceps tendon 09/30/2020  . 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 Aneurysm ([place]) 03/18/2020  . History of TIA (transient ischemic attack) 02/14/2020  . COVID-19 virus infection 02/14/2020  . TIA (transient ischemic attack) 04/15/2018  . History of stroke   . Mixed hyperlipidemia   . Essential hypertension   . Acute pain of left shoulder 09/08/2017  . Impingement syndrome of right shoulder 12/24/2016  . Tobacco use 09/28/2016  . Glenohumeral arthritis 12/27/2014  . 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 [place], [place], [address] Phone: [phone]   Fax:  [phone]  Name: Meagan Flores MRN: [id number] Date of Birth: [birth year]

## 2021-04-03 NOTE — Telephone Encounter (Signed)
Called patient informed her she can do physical therapy with monitor. No further questions.

## 2021-04-07 ENCOUNTER — Ambulatory Visit: Payer: Commercial Managed Care - PPO | Admitting: Physical Therapy

## 2021-04-07 ENCOUNTER — Other Ambulatory Visit: Payer: Self-pay

## 2021-04-07 DIAGNOSIS — M6281 Muscle weakness (generalized): Secondary | ICD-10-CM

## 2021-04-07 DIAGNOSIS — M25611 Stiffness of right shoulder, not elsewhere classified: Secondary | ICD-10-CM

## 2021-04-07 DIAGNOSIS — M25511 Pain in right shoulder: Secondary | ICD-10-CM

## 2021-04-07 NOTE — Therapy (Signed)
[facility]. [place], [place], [address] Phone: [phone]   Fax:  [phone]  Physical Therapy Treatment  Patient Details  Name: Meagan Flores MRN: [id number] Date of Birth: Jun 19, 1966 Referring Provider (PT): Dwana Melena PA-C for Dr. Erlinda Hong   Encounter Date: 04/07/2021   PT End of Session - 04/07/21 0830    Visit Number 17    Date for PT Re-Evaluation 05/01/21    PT Start Time 0758    PT Stop Time 0836    PT Time Calculation (min) 38 min           Past Medical History:  Diagnosis Date  . Acute pain of left shoulder 09/08/2017  . Aneurysm ([place]) 03/18/2020  . 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 COVID-19 virus infection 02/14/2020  . 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) tear of right shoulder 09/30/2020  . Essential hypertension   . Glenohumeral arthritis 12/27/2014  . History of stroke   . History of TIA (transient ischemic attack) 02/14/2020  . Hyperlipidemia   . Hypothyroidism   . Impingement syndrome of right shoulder 12/24/2016  . Mixed hyperlipidemia   . 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 Prediabetes   . Preop cardiovascular exam 11/25/2020  . Pulmonary hypertension, unspecified ([place]) 11/25/2020  . 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 Rotator cuff impingement syndrome of right shoulder 09/30/2020  . Skin rash 02/17/2021  . Stroke ([place])   . Tendinopathy of right biceps tendon 09/30/2020  . Thyroid disease   . TIA (transient ischemic attack) 04/15/2018  . Tobacco use 09/28/2016    Past Surgical History:  Procedure Laterality Date  . PARTIAL HYSTERECTOMY    . right arm sx    . SHOULDER ARTHROSCOPY WITH BICEPSTENOTOMY Right 01/21/2021   Procedure: SHOULDER ARTHROSCOPY WITH BICEPSTENOTOMY;  Surgeon: Leandrew Koyanagi, MD;  Location: [facility];  Service: Orthopedics;  Laterality: Right;  . SHOULDER ARTHROSCOPY WITH DISTAL CLAVICLE RESECTION Right  01/12/2017   Procedure: RIGHT SHOULDER ARTHROSCOPY WITH  SUBACROMIAL DECOMPRESSION, DISTAL CLAVICLE EXCISION, extensive debridement;  Surgeon: Leandrew Koyanagi, MD;  Location: [facility];  Service: Orthopedics;  Laterality: Right;  . THYMECTOMY  2004  . THYROIDECTOMY      There were no vitals filed for this visit.   Subjective Assessment - 04/07/21 0754    Subjective " I am here" " seeing MD next week -need to find out why my arm won't stop hurting" hurting real bad last night, had to take pain pills, there is catch    Pain Score 6     Pain Location Shoulder    Pain Orientation Right              [facility] PT Assessment - 04/07/21 0001      Strength   Overall Strength Comments Grip strength 5# today on right dominant side   left side 45#                        [facility] Adult PT Treatment/Exercise - 04/07/21 0001      Neck Exercises: Machines for Strengthening   UBE (Upper Arm Bike) L2 45fd/3bck    Nustep L 3 push /pull 2 min and then SW 2 min      Shoulder Exercises: Standing   Extension Strengthening;Both;Theraband;15 reps    Theraband Level (Shoulder Extension) Level 2 (Red)    Row Strengthening;Both;Theraband;15 reps    Theraband Level (Shoulder Row) Level 2 (  Red)    Other Standing Exercises 3# shruggs, backward rolls and scap squeeze 10 each    Other Standing Exercises 3# cane ex IR,Ext,chest press,ER and bicep curl 15 x each   empty can 2# 10 x     Manual Therapy   Manual Therapy Soft tissue mobilization;Passive ROM;Joint mobilization    Joint Mobilization gentle jt capsule stretching    Soft tissue mobilization RT UT- very tender with TP - limited tolerance    Passive ROM RT shld- very good PROM                    PT Short Term Goals - 02/03/21 1341      PT SHORT TERM GOAL #1   Title Independent with initial HEP    Status Achieved             PT Long Term Goals - 04/03/21 0839      PT LONG TERM GOAL #1   Title Independent  with advanced HEP    Status Partially Met      PT LONG TERM GOAL #2   Title Pt will be able to return to performing some household chores and grooming utilizing R arm    Status On-going      PT LONG TERM GOAL #3   Title Pt will demo R shoulder ROM symmetrical to left side    Status On-going      PT LONG TERM GOAL #4   Title Grip strength on the right to be greater than or equal to left to facilitate return to job duties, grasp with functional activities.    Status On-going                 Plan - 04/07/21 0831    Clinical Impression Statement pt tolerated ther ex fair, did but painful. good PROM. RT grip 5# vs 45# on left ( RT dominate). pt with tenderness and TP RT UT but limited tolerance to STW. pt reports seeing MD next week. verb relief with estim - unabel to use due to heart monitor which comes off tomorrow. pt has been given info re: TENS    PT Treatment/Interventions Vasopneumatic Device;Taping;Electrical Stimulation;Cryotherapy;ADLs/Self Care Home Management;Iontophoresis 24m/ml Dexamethasone;Moist Heat;Neuromuscular re-education;Therapeutic exercise;Therapeutic activities;Functional mobility training;Patient/family education;Manual techniques;Scar mobilization    PT Next Visit Plan continue to progress functional  ROM and strength.           Patient will benefit from skilled therapeutic intervention in order to improve the following deficits and impairments:  Decreased range of motion,Increased fascial restricitons,Impaired UE functional use,Pain,Improper body mechanics,Impaired flexibility,Hypomobility,Decreased balance,Decreased mobility,Decreased strength,Increased edema,Postural dysfunction  Visit Diagnosis: Acute pain of right shoulder  Stiffness of right shoulder, not elsewhere classified  Muscle weakness (generalized)     Problem List Patient Active Problem List   Diagnosis Date Noted  . Thyroid disease   . [place]   . Skin rash 02/17/2021  .  Hypothyroidism   . Preop cardiovascular exam 11/25/2020  . Pulmonary hypertension, unspecified ([place] 11/25/2020  . Prediabetes   . Hyperlipidemia   . 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) tear of right shoulder 09/30/2020  . Rotator cuff impingement syndrome of right shoulder 09/30/2020  . 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 Tendinopathy of right biceps tendon 09/30/2020  . 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 [place] ([place] 03/18/2020  . History of TIA (transient ischemic attack) 02/14/2020  . COVID-19 virus infection 02/14/2020  . TIA (transient ischemic attack) 04/15/2018  . History  of stroke   . Mixed hyperlipidemia   . Essential hypertension   . Acute pain of left shoulder 09/08/2017  . Impingement syndrome of right shoulder 12/24/2016  . Tobacco use 09/28/2016  . Glenohumeral arthritis 12/27/2014  . 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 [place], [place], [address] Phone: [phone]   Fax:  [phone]  Name: Meagan Flores MRN: [id number] Date of Birth: Jul 12, 1966

## 2021-04-09 ENCOUNTER — Other Ambulatory Visit: Payer: Self-pay

## 2021-04-09 ENCOUNTER — Ambulatory Visit: Payer: Commercial Managed Care - PPO

## 2021-04-09 DIAGNOSIS — R293 Abnormal posture: Secondary | ICD-10-CM

## 2021-04-09 DIAGNOSIS — G8929 Other chronic pain: Secondary | ICD-10-CM

## 2021-04-09 DIAGNOSIS — M25511 Pain in right shoulder: Secondary | ICD-10-CM | POA: Diagnosis not present

## 2021-04-09 DIAGNOSIS — M6281 Muscle weakness (generalized): Secondary | ICD-10-CM

## 2021-04-09 DIAGNOSIS — M25611 Stiffness of right shoulder, not elsewhere classified: Secondary | ICD-10-CM

## 2021-04-09 DIAGNOSIS — M25612 Stiffness of left shoulder, not elsewhere classified: Secondary | ICD-10-CM

## 2021-04-09 DIAGNOSIS — M542 Cervicalgia: Secondary | ICD-10-CM

## 2021-04-09 DIAGNOSIS — M25512 Pain in left shoulder: Secondary | ICD-10-CM

## 2021-04-09 NOTE — Therapy (Signed)
[place]. [place], [place], [address] Phone: [phone]   Fax:  [phone]  Physical Therapy Treatment  Patient Details  Name: Meagan Flores MRN: [id number] Date of Birth: 1966/04/30 Referring Provider (PT): Dwana Melena PA-C for Dr. Erlinda Hong   Encounter Date: 04/09/2021   PT End of Session - 04/09/21 0801    Visit Number --   visit # 17 including eval   Date for PT Re-Evaluation 05/01/21    PT Start Time 0758    PT Stop Time 0845    PT Time Calculation (min) 47 min    Activity Tolerance Patient limited by pain;Patient tolerated treatment well    Behavior During Therapy [facility] for tasks assessed/performed           Past Medical History:  Diagnosis Date  . Acute pain of left shoulder 09/08/2017  . Aneurysm ([place]) 03/18/2020  . 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 COVID-19 virus infection 02/14/2020  . 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) tear of right shoulder 09/30/2020  . Essential hypertension   . Glenohumeral arthritis 12/27/2014  . History of stroke   . History of TIA (transient ischemic attack) 02/14/2020  . Hyperlipidemia   . Hypothyroidism   . Impingement syndrome of right shoulder 12/24/2016  . Mixed hyperlipidemia   . 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 Prediabetes   . Preop cardiovascular exam 11/25/2020  . Pulmonary hypertension, unspecified ([place]) 11/25/2020  . 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 Rotator cuff impingement syndrome of right shoulder 09/30/2020  . Skin rash 02/17/2021  . Stroke ([place])   . Tendinopathy of right biceps tendon 09/30/2020  . Thyroid disease   . TIA (transient ischemic attack) 04/15/2018  . Tobacco use 09/28/2016    Past Surgical History:  Procedure Laterality Date  . PARTIAL HYSTERECTOMY    . right arm sx    . SHOULDER ARTHROSCOPY WITH BICEPSTENOTOMY Right 01/21/2021   Procedure: SHOULDER ARTHROSCOPY WITH BICEPSTENOTOMY;   Surgeon: Leandrew Koyanagi, MD;  Location: [place];  Service: Orthopedics;  Laterality: Right;  . SHOULDER ARTHROSCOPY WITH DISTAL CLAVICLE RESECTION Right 01/12/2017   Procedure: RIGHT SHOULDER ARTHROSCOPY WITH  SUBACROMIAL DECOMPRESSION, DISTAL CLAVICLE EXCISION, extensive debridement;  Surgeon: Leandrew Koyanagi, MD;  Location: [facility];  Service: Orthopedics;  Laterality: Right;  . THYMECTOMY  2004  . THYROIDECTOMY      There were no vitals filed for this visit.   Subjective Assessment - 04/09/21 0758    Subjective "confused why it still hurts so much, wont stop hurting." States she is going to Dr Erlinda Hong next wednesday. "hurt me so bad last night I couldnt even sleep"    Pertinent History History of surgery on R shoulder x2  and elbow surgery x2.    Patient Stated Goals decrease pain, get back to PLOF    Currently in Pain? Yes    Pain Score 6     Pain Location Shoulder    Pain Orientation Right              [facility] PT Assessment - 04/09/21 0001      Sensation   Light Touch Impaired by gross assessment    Additional Comments Decreased sensation along last 2 fingers on the right hand compated to left hand, in addition to diminished strength                         [facility] Adult  PT Treatment/Exercise - 04/09/21 0001      Neck Exercises: Machines for Strengthening   UBE (Upper Arm Bike) L2 62fwd/3bck      Shoulder Exercises: Standing   External Rotation Strengthening;Right;10 reps;Theraband    Theraband Level (Shoulder External Rotation) Level 1 (Yellow)    Internal Rotation Strengthening;Right;10 reps;Theraband    Theraband Level (Shoulder Internal Rotation) Level 1 (Yellow)    Extension Strengthening;Both;Theraband;15 reps;20 reps    Theraband Level (Shoulder Extension) Level 2 (Red)    Row Strengthening;Both;Theraband;15 reps    Theraband Level (Shoulder Row) Level 2 (Red)    Other Standing Exercises 3# shruggs, backward rolls and scap  squeeze 10 each    Other Standing Exercises 3# cane ex IR,Ext,chest press,ER and bicep curl 15 x each      Moist Heat Therapy   Number Minutes Moist Heat 15 Minutes    Moist Heat Location Shoulder      Electrical Stimulation   Electrical Stimulation Location Right shoulder  anterior ; right UT    Electrical Stimulation Action IFC - 15 min    Electrical Stimulation Parameters seated    Electrical Stimulation Goals Pain;Tone      Manual Therapy   Manual Therapy Soft tissue mobilization;Passive ROM;Joint mobilization    Joint Mobilization gentle jt capsule stretching    Soft tissue mobilization RT UT- very tender with TP - limited tolerance    Passive ROM RT shld- very good PROM                    PT Short Term Goals - 02/03/21 1341      PT SHORT TERM GOAL #1   Title Independent with initial HEP    Status Achieved             PT Long Term Goals - 04/03/21 0839      PT LONG TERM GOAL #1   Title Independent with advanced HEP    Status Partially Met      PT LONG TERM GOAL #2   Title Pt will be able to return to performing some household chores and grooming utilizing R arm    Status On-going      PT LONG TERM GOAL #3   Title Pt will demo R shoulder ROM symmetrical to left side    Status On-going      PT LONG TERM GOAL #4   Title Grip strength on the right to be greater than or equal to left to facilitate return to job duties, grasp with functional activities.    Status On-going                 Plan - 04/09/21 0802    Clinical Impression Statement Meagan Flores continues to tolerate therex fairly but with pain that she reports is constant. PROM is progressing nicely. However she does continue to demo decreased right grip strength despite grip strengthening exercises(grip 5# vs 45# on left). and is right hand dominant and she is very concerned about this. Upon further screening today she also has diminished sensation at the last 2 digits of her right hand compared  to her left, and hypersensitivity along the RUE starting at the right UT.  Estim and heat tends to help diminish shoulder pain in the short term.  She reports she is seeing MD next week.    PT Treatment/Interventions Vasopneumatic Device;Taping;Electrical Stimulation;Cryotherapy;ADLs/Self Care Home Management;Iontophoresis 4mg /ml Dexamethasone;Moist Heat;Neuromuscular re-education;Therapeutic exercise;Therapeutic activities;Functional mobility training;Patient/family education;Manual techniques;Scar mobilization    PT Next Visit  Plan continue to progress functional  ROM and strength.    Consulted and Agree with Plan of Care Patient           Patient will benefit from skilled therapeutic intervention in order to improve the following deficits and impairments:  Decreased range of motion,Increased fascial restricitons,Impaired UE functional use,Pain,Improper body mechanics,Impaired flexibility,Hypomobility,Decreased balance,Decreased mobility,Decreased strength,Increased edema,Postural dysfunction  Visit Diagnosis: Acute pain of right shoulder  Stiffness of right shoulder, not elsewhere classified  Muscle weakness (generalized)  Cervicalgia  Abnormal posture  Chronic left shoulder pain  Stiffness of left shoulder, not elsewhere classified     Problem List Patient Active Problem List   Diagnosis Date Noted  . Thyroid disease   . Stroke ([place])   . Skin rash 02/17/2021  . Hypothyroidism   . Preop cardiovascular exam 11/25/2020  . Pulmonary hypertension, unspecified ([place]) 11/25/2020  . Prediabetes   . Hyperlipidemia   . 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) tear of right shoulder 09/30/2020  . Rotator cuff impingement syndrome of right shoulder 09/30/2020  . 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 Tendinopathy of right biceps tendon 09/30/2020  . 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 Aneurysm ([place]) 03/18/2020  . History of TIA (transient ischemic  attack) 02/14/2020  . COVID-19 virus infection 02/14/2020  . TIA (transient ischemic attack) 04/15/2018  . History of stroke   . Mixed hyperlipidemia   . Essential hypertension   . Acute pain of left shoulder 09/08/2017  . Impingement syndrome of right shoulder 12/24/2016  . Tobacco use 09/28/2016  . Glenohumeral arthritis 12/27/2014  . 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    Hall Busing, PT, DPT 04/09/2021, 8:42 AM  [place]. [place], [place], [address] Phone: [phone]   Fax:  [phone]  Name: Meagan Flores MRN: [id number] Date of Birth: Mar 25, 1966

## 2021-04-14 ENCOUNTER — Other Ambulatory Visit: Payer: Self-pay

## 2021-04-14 ENCOUNTER — Ambulatory Visit: Payer: Commercial Managed Care - PPO | Admitting: Physical Therapy

## 2021-04-14 DIAGNOSIS — M25611 Stiffness of right shoulder, not elsewhere classified: Secondary | ICD-10-CM

## 2021-04-14 DIAGNOSIS — M25511 Pain in right shoulder: Secondary | ICD-10-CM

## 2021-04-14 DIAGNOSIS — M6281 Muscle weakness (generalized): Secondary | ICD-10-CM

## 2021-04-14 NOTE — Patient Instructions (Signed)

## 2021-04-14 NOTE — Therapy (Signed)
[place]. [place], [place], [address] Phone: [phone]   Fax:  [phone]  Physical Therapy Treatment  Patient Details  Name: Meagan Flores MRN: [id number] Date of Birth: October 16, 1966 Referring Provider (PT): Dwana Melena PA-C for Dr. Erlinda Hong   Encounter Date: 04/14/2021   PT End of Session - 04/14/21 0824    Visit Number 18    Date for PT Re-Evaluation 05/01/21    PT Start Time 0755    PT Stop Time 0845    PT Time Calculation (min) 50 min           Past Medical History:  Diagnosis Date  . Acute pain of left shoulder 09/08/2017  . Aneurysm ([place]) 03/18/2020  . 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 COVID-19 virus infection 02/14/2020  . 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) tear of right shoulder 09/30/2020  . Essential hypertension   . Glenohumeral arthritis 12/27/2014  . History of stroke   . History of TIA (transient ischemic attack) 02/14/2020  . Hyperlipidemia   . Hypothyroidism   . Impingement syndrome of right shoulder 12/24/2016  . Mixed hyperlipidemia   . 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 Prediabetes   . Preop cardiovascular exam 11/25/2020  . Pulmonary hypertension, unspecified ([place]) 11/25/2020  . 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 Rotator cuff impingement syndrome of right shoulder 09/30/2020  . Skin rash 02/17/2021  . Stroke ([place])   . Tendinopathy of right biceps tendon 09/30/2020  . Thyroid disease   . TIA (transient ischemic attack) 04/15/2018  . Tobacco use 09/28/2016    Past Surgical History:  Procedure Laterality Date  . PARTIAL HYSTERECTOMY    . right arm sx    . SHOULDER ARTHROSCOPY WITH BICEPSTENOTOMY Right 01/21/2021   Procedure: SHOULDER ARTHROSCOPY WITH BICEPSTENOTOMY;  Surgeon: Leandrew Koyanagi, MD;  Location: [facility];  Service: Orthopedics;  Laterality: Right;  . SHOULDER ARTHROSCOPY WITH DISTAL CLAVICLE RESECTION Right  01/12/2017   Procedure: RIGHT SHOULDER ARTHROSCOPY WITH  SUBACROMIAL DECOMPRESSION, DISTAL CLAVICLE EXCISION, extensive debridement;  Surgeon: Leandrew Koyanagi, MD;  Location: [facility];  Service: Orthopedics;  Laterality: Right;  . THYMECTOMY  2004  . THYROIDECTOMY      There were no vitals filed for this visit.   Subjective Assessment - 04/14/21 0757    Subjective "confused why it still hurts so much, wont stop hurting."  " still have that catch in it"    Currently in Pain? Yes    Pain Score 6     Pain Location Shoulder    Pain Orientation Right              [facility] PT Assessment - 04/14/21 0001      AROM   AROM Assessment Site Shoulder    Right/Left Shoulder Right    Right Shoulder Flexion 113 Degrees   catch at 110   Right Shoulder ABduction 75 Degrees    Right Shoulder Internal Rotation 70 Degrees    Right Shoulder External Rotation 65 Degrees                         [facility] Adult PT Treatment/Exercise - 04/14/21 0001      Neck Exercises: Machines for Strengthening   UBE (Upper Arm Bike) L2 33fd/3bck    Cybex Row 15# 2 sets 10    Lat Pull 15# 2 sets 10      Shoulder Exercises: Standing  Other Standing Exercises 3# cane ex IR,Ext,chest press,ER and bicep curl 15 x each      Moist Heat Therapy   Number Minutes Moist Heat 15 Minutes    Moist Heat Location Shoulder      Electrical Stimulation   Electrical Stimulation Location Right shoulder  anterior ; right UT    Electrical Stimulation Action IFC    Electrical Stimulation Parameters seated    Electrical Stimulation Goals Pain;Tone      Manual Therapy   Manual Therapy Soft tissue mobilization;Passive ROM;Joint mobilization    Soft tissue mobilization RT UT- very tender with TP - limited tolerance            Trigger Point Dry Needling - 04/14/21 0001    Consent Given? Yes    Education Handout Provided Yes    Muscles Treated Head and Neck Upper trapezius    Upper Trapezius Response  Twitch reponse elicited                  PT Short Term Goals - 02/03/21 1341      PT SHORT TERM GOAL #1   Title Independent with initial HEP    Status Achieved             PT Long Term Goals - 04/14/21 0801      PT LONG TERM GOAL #1   Title Independent with advanced HEP    Status Partially Met      PT LONG TERM GOAL #2   Title Pt will be able to return to performing some household chores and grooming utilizing R arm    Baseline varies with activty but limited    Status Partially Met      PT LONG TERM GOAL #3   Title Pt will demo R shoulder ROM symmetrical to left side    Status On-going      PT LONG TERM GOAL #4   Title Grip strength on the right to be greater than or equal to left to facilitate return to job duties, grasp with functional activities.    Status On-going                 Plan - 04/14/21 0758    Clinical Impression Statement Meagan Flores continues to tolerate ther ex fairly well,despite reports of constant pain. PROM is very good- WFLs. Continued decreased RT grip strength ( 5# on RT vs 45# on left) Rt dominant. Per PT assessment last session diminished sensation at last 2 digits on RT had and hypersensitivity along RT UE starting at RT UT. Short term relief with estimand MH.Limited goal progress d/t pain. Trial of DN in UT to see if helps pain as she is very tender,guarded and TP.    PT Treatment/Interventions Vasopneumatic Device;Taping;Electrical Stimulation;Cryotherapy;ADLs/Self Care Home Management;Iontophoresis 23m/ml Dexamethasone;Moist Heat;Neuromuscular re-education;Therapeutic exercise;Therapeutic activities;Functional mobility training;Patient/family education;Manual techniques;Scar mobilization    PT Next Visit Plan MD note sent with pt for 5/18 appt- pt to advise after appt re: PT           Patient will benefit from skilled therapeutic intervention in order to improve the following deficits and impairments:  Decreased range of  motion,Increased fascial restricitons,Impaired UE functional use,Pain,Improper body mechanics,Impaired flexibility,Hypomobility,Decreased balance,Decreased mobility,Decreased strength,Increased edema,Postural dysfunction  Visit Diagnosis: Acute pain of right shoulder  Stiffness of right shoulder, not elsewhere classified  Muscle weakness (generalized)     Problem List Patient Active Problem List   Diagnosis Date Noted  . Thyroid disease   . Stroke (  [place])   . Skin rash 02/17/2021  . Hypothyroidism   . Preop cardiovascular exam 11/25/2020  . Pulmonary hypertension, unspecified ([place]) 11/25/2020  . Prediabetes   . Hyperlipidemia   . 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) tear of right shoulder 09/30/2020  . Rotator cuff impingement syndrome of right shoulder 09/30/2020  . 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 Tendinopathy of right biceps tendon 09/30/2020  . 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 Aneurysm ([place]) 03/18/2020  . History of TIA (transient ischemic attack) 02/14/2020  . COVID-19 virus infection 02/14/2020  . TIA (transient ischemic attack) 04/15/2018  . History of stroke   . Mixed hyperlipidemia   . Essential hypertension   . Acute pain of left shoulder 09/08/2017  . Impingement syndrome of right shoulder 12/24/2016  . Tobacco use 09/28/2016  . Glenohumeral arthritis 12/27/2014  . 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    Sumner Boast PTA 04/14/2021, 8:29 AM  [place]. [place], [place], [address] Phone: [phone]   Fax:  [phone]  Name: Meagan Flores MRN: [id number] Date of Birth: 1966/10/13

## 2021-04-15 ENCOUNTER — Ambulatory Visit (INDEPENDENT_AMBULATORY_CARE_PROVIDER_SITE_OTHER): Payer: Commercial Managed Care - PPO | Admitting: Orthopaedic Surgery

## 2021-04-15 ENCOUNTER — Other Ambulatory Visit: Payer: Self-pay

## 2021-04-15 DIAGNOSIS — Z9889 Other specified postprocedural states: Secondary | ICD-10-CM

## 2021-04-15 MED ORDER — TRAMADOL HCL 50 MG PO TABS: 50.0000 mg | ORAL_TABLET | Freq: Every day | ORAL | 0 refills | Status: DC | PRN

## 2021-04-15 NOTE — Progress Notes (Signed)
Post-Op Visit Note   Patient: Meagan Flores           Date of Birth: 11-11-1966           MRN: [id number] Visit Date: 04/15/2021 PCP: Elsie Stain, MD   Assessment & Plan:  Chief Complaint:  Chief Complaint  Patient presents with  . Right Shoulder - Pain  . Left Shoulder - Pain   Visit Diagnoses:  1. S/P arthroscopy of right shoulder     Plan:   Meagan Flores is approximately 12 weeks status post right shoulder arthroscopy with debridement.  She has attended about 18 sessions of outpatient physical therapy.  She continues to have pain throughout the shoulder and she describes a catching sensation with elevation of the arm.  Denies any injuries.  She denies any neck pain or radicular symptoms.  The injection Dr. Junius Roads administered helped for about a week.  She continues to have pain and dysfunction to the right arm.  Denies any fevers or chills or constitutional symptoms.  The right shoulder shows full healed surgical scars.  There is no warmth or skin changes.  Passive range of motion is near normal with significant guarding and pain.  Strength exam is very limited secondary to pain and guarding and participation.  Difficult assess what structures may be involved based on global pain.  She has no neck pain and no tenderness to C-spine.  Normal range of motion of the C-spine.  Negative Spurling.  At this point given the lack of relief from extensive physical therapy and cortisone injection we will need to obtain a new MRI to evaluate for structural abnormalities.  She is not ready to return back to work until we do more investigation as to why she continues to have the severe pain.  Follow-Up Instructions: Return for After MRI.   Orders:  No orders of the defined types were placed in this encounter.  Meds ordered this encounter  Medications  . traMADol (ULTRAM) 50 MG tablet    Sig: Take 1-2 tablets (50-100 mg total) by mouth daily as needed.    Dispense:  20 tablet    Refill:  0     Imaging: No results found.  PMFS History: Patient Active Problem List   Diagnosis Date Noted  . Thyroid disease   . Stroke ([place])   . Skin rash 02/17/2021  . Hypothyroidism   . Preop cardiovascular exam 11/25/2020  . Pulmonary hypertension, unspecified ([place]) 11/25/2020  . Prediabetes   . Hyperlipidemia   . 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) tear of right shoulder 09/30/2020  . Rotator cuff impingement syndrome of right shoulder 09/30/2020  . 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 Tendinopathy of right biceps tendon 09/30/2020  . 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 Aneurysm ([place]) 03/18/2020  . History of TIA (transient ischemic attack) 02/14/2020  . COVID-19 virus infection 02/14/2020  . TIA (transient ischemic attack) 04/15/2018  . History of stroke   . Mixed hyperlipidemia   . Essential hypertension   . Acute pain of left shoulder 09/08/2017  . Impingement syndrome of right shoulder 12/24/2016  . Tobacco use 09/28/2016  . Glenohumeral arthritis 12/27/2014  . 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   Past Medical History:  Diagnosis Date  . Acute pain of left shoulder 09/08/2017  . Aneurysm ([place]) 03/18/2020  . 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 COVID-19 virus infection 02/14/2020  . 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) tear of right shoulder 09/30/2020  . Essential hypertension   .  Glenohumeral arthritis 12/27/2014  . History of stroke   . History of TIA (transient ischemic attack) 02/14/2020  . Hyperlipidemia   . Hypothyroidism   . Impingement syndrome of right shoulder 12/24/2016  . Mixed hyperlipidemia   . 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 Prediabetes   . Preop cardiovascular exam 11/25/2020  . Pulmonary hypertension, unspecified ([place]) 11/25/2020  . 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 Rotator cuff impingement syndrome of right shoulder 09/30/2020  . Skin rash 02/17/2021  .  Stroke ([place])   . Tendinopathy of right biceps tendon 09/30/2020  . Thyroid disease   . TIA (transient ischemic attack) 04/15/2018  . Tobacco use 09/28/2016    Family History  Problem Relation Age of Onset  . Stomach cancer Sister   . Colon polyps Sister   . Esophageal cancer Neg Hx   . Rectal cancer Neg Hx   . Colon cancer Neg Hx   . Breast cancer Neg Hx     Past Surgical History:  Procedure Laterality Date  . PARTIAL HYSTERECTOMY    . right arm sx    . SHOULDER ARTHROSCOPY WITH BICEPSTENOTOMY Right 01/21/2021   Procedure: SHOULDER ARTHROSCOPY WITH BICEPSTENOTOMY;  Surgeon: Leandrew Koyanagi, MD;  Location: [facility];  Service: Orthopedics;  Laterality: Right;  . SHOULDER ARTHROSCOPY WITH DISTAL CLAVICLE RESECTION Right 01/12/2017   Procedure: RIGHT SHOULDER ARTHROSCOPY WITH  SUBACROMIAL DECOMPRESSION, DISTAL CLAVICLE EXCISION, extensive debridement;  Surgeon: Leandrew Koyanagi, MD;  Location: [facility];  Service: Orthopedics;  Laterality: Right;  . THYMECTOMY  2004  . THYROIDECTOMY     Social History   Occupational History  . Not on file  Tobacco Use  . Smoking status: Current Every Day Smoker    Packs/day: 0.25    Years: 34.00    Pack years: 8.50  . Smokeless tobacco: Never Used  Vaping Use  . Vaping Use: Never used  Substance and Sexual Activity  . Alcohol use: Yes    Comment: occassional  . Drug use: No  . Sexual activity: Not on file

## 2021-04-17 ENCOUNTER — Telehealth: Payer: Self-pay | Admitting: Emergency Medicine

## 2021-04-17 MED ORDER — METOPROLOL SUCCINATE ER 50 MG PO TB24: 50.0000 mg | ORAL_TABLET | Freq: Every day | ORAL | 1 refills | Status: DC

## 2021-04-17 NOTE — Telephone Encounter (Signed)
Called patient informed her of results. She will start metoprolol succinate 50 mg daily. No further questions.

## 2021-04-17 NOTE — Telephone Encounter (Signed)
-----   Message from Park Liter, MD sent at 04/16/2021  8:27 PM EDT ----- Multiple supraventricular tachycardia total of 820 with the longest episode 40 minutes explanation Mark, please start metoprolol succinate 50 mg daily

## 2021-04-19 NOTE — Progress Notes (Signed)
Subjective:    Patient ID: Meagan Flores, female    DOB: 1966/11/08, 55 y.o.   MRN: [id number] Virtual Visit via Telephone Note  I connected with Meagan Flores on 04/20/21 at  3:30 PM EDT by telephone and verified that I am speaking with the correct person using two identifiers.   Consent:  I discussed the limitations, risks, security and privacy concerns of performing an evaluation and management service by telephone and the availability of in person appointments. I also discussed with the patient that there may be a patient responsible charge related to this service. The patient expressed understanding and agreed to proceed.  Location of patient: Patient's in her car  Location of provider: I am in my office  Persons participating in the televisit with the patient.   No one else on the call   History of Present Illness: 02/14/20 This is a 55 year old female who was in the emergency department on 9 March with symptoms consistent with viral infection.  The patient initially came in with general malaise fever muscle aches and pain headaches and dizziness loss of taste and smell Covid test was positive the patient was not hypoxic chest x-ray did not show infiltrates patient was given supportive care fluid hydration and then discharged later in the day.  This is a post ER follow-up visit and also to establish in the clinic as the patient does not have a primary care provider.  This patient states she is improving over the last 24 hours.  Previously over the last several days she had episodes of diarrhea nausea and vomiting without abdominal pain.  She still lost her sense of taste.  She states that she has had cough is paroxysmal but her shortness of breath is resolved urine output is adequate urine is not concentrated.  She states her muscle aches and pains are resolving she still has some pain in the back and hurts some in the legs she also still has some fatigue.  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  Former smoker now only smoking the occasional amounts.  Previous history of stroke and had been on aspirin 81 mg daily and lipid therapy.  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  The only active medicine she has at this time includes over-the-counter zinc and vitamin C and she also has the Synthroid 125 mcg daily.  4/20 This patient returns today in follow-up after the first visit being a telehealth visit.  The patient did have Covid viral infection and is rapidly improved from this.  She still has some fatigue.  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  She is smoking now pack of cigarettes every 3 days.  Note the patient comes in today with adequate blood pressure and not requiring blood pressure medications.  The patient is on Synthroid 125 mcg daily without symptoms of hypothyroidism at this time.  Also is on aspirin and atorvastatin for stroke prevention  This patient has suffered lost during the pandemic and that her son was murdered in January she lost both her parents several months apart late in 2020 she is suffering grief reaction from these losses  04/15/2020 Patient seen in return follow-up and has had complete resolution of the COVID-19 viral infection.  The patient has not yet received the Covid  vaccine and is giving this consideration.  Patient states her biggest issue now is right arm and elbow pain.  Her right dominant hand is numb in the ulnar distribution.  She had a cubital fossa surgery impingement release previously by a hand surgeon several years ago in fact almost 10 years ago.  On this occurred on the right elbow and also she has had impingement syndrome of the acromioclavicular joint with surgery arthroscopy done in 2018 by orthopedics locally.  She states her arm is hurting worse now.  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  Patient continues to smoke about a pack every 3 days.  He denies any shortness of breath cough or other respiratory complaints.  She is wishing to have referral back to orthopedics.  Note at the last visit her thyroid function was normal and she is on Synthroid replacement.  Blood pressure at this visit is well controlled at 117/80.  07/29/2020 The patient is seen in return follow-up for hypertension, glenohumeral arthritis of the right shoulder, tobacco use.  Patient states in the interim she is still smoking 1 pack every 4 days of cigarettes.  She has difficulty falling asleep and will sleep late into the morning.  She is noted increased headaches and palpitations.  She pulled a muscle in her left upper chest recently.  She never received the nicotine replacement therapy rep we recommended.  She is due a mammogram.  She notes increased stress anxiety and dysphoria.  She is not been on antidepressants before and is interested in receiving this.  She notes her right shoulder is still having some pain to take with movement.  She uses Tylenol for this and this seems to help.  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  Note her PHQ 9 score is very elevated at this visit.  09/10/2020 This is a telephone visit for this 55 year old female seen in follow-up after had previously been seen in August of this year.  The patient states she is having difficulty with her thyroid medication.  She states since being on the 125 mcg of the Synthroid she is feeling more jittery.  She has lost weight not intentionally.  She has lost her appetite.  She feels her heart is racing.  She is down to 7 to 8 cigarettes daily.  She still has right shoulder pain which is chronic.  She has a history of acromioclavicular joint impingement syndrome and continues to try to work under these conditions.  Her anxiety appears to be unchanged despite  being on sertraline 50 mg daily.  10/21/2020 Patient is seen in return follow-up visit history of postoperative hypothyroidism we reduced her dose to 7 thyroid 100 mcg daily at the last visit she is now here for follow-up labs  Also history of glenohumeral arthritis with nerve impingement syndrome and tendinosis of the biceps tendon had she has been seen by orthopedics and she will need to undergo orthoscopic surgery of the shoulder per Dr. 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  She has a cardiology visit today for this.  She denies any significant hypersomnolence or snoring.  Patient is also yet to receive her mammogram.  She also is due a Covid booster.  Patient denies any other symptoms at this time.  Her ankle is improving and she just got out of the ankle boot.  She is still smoking 2 to 4 cigarettes daily.     02/17/2021 Patient seen in return follow-up hypertension is well controlled on arrival blood pressure 124/83.  She does state however she had side effects from the valsartan and had to stop the medication she is try to follow good diet and exercise  Patient complains of left shoulder pain that is new in onset she has previously had surgery on the right shoulder with MRIs  Patient's smoking is down to 1 pack every 4 days  5/23 This patient presents today by way of a phone visit.  She has had recent arthroscopy of the right shoulder and has been slow to recover.  She has had extensive outpatient physical therapy without improvements.  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   She is not able to return to work because of her orthopedic disability.  She has an MRI that is pending to see what the status of her arthritis is in the right shoulder.  She is compliant with her thyroid medication, patient is no longer smoking tobacco products.  No other stroke symptoms.  She is compliant with all her other medications.  She needs refills on all her medications.  Patient is on tramadol for pain per orthopedics.  She states the rash in her forearm is still persistent.  She states the steroid cream helps to some degree.  Past Medical History:  Diagnosis Date  . Acute pain of left shoulder 09/08/2017  . Aneurysm ([place]) 03/18/2020  . 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 COVID-19 virus infection 02/14/2020  . 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) tear of right shoulder 09/30/2020  . Essential hypertension   . Glenohumeral arthritis 12/27/2014  . History of stroke   . History of TIA (transient ischemic attack) 02/14/2020  . Hyperlipidemia   . Hypothyroidism   . Impingement syndrome of right shoulder 12/24/2016  . Mixed hyperlipidemia   . 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 Prediabetes   . Preop cardiovascular exam 11/25/2020  . Pulmonary hypertension, unspecified ([place]) 11/25/2020  . 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 Rotator cuff impingement syndrome of right shoulder 09/30/2020  . Skin rash 02/17/2021  . Stroke ([place])   . Tendinopathy of right biceps tendon 09/30/2020  . Thyroid disease   . TIA (transient ischemic attack) 04/15/2018  . Tobacco use 09/28/2016     Family History  Problem Relation Age of Onset  . Stomach cancer Sister   . Colon polyps Sister   . Esophageal cancer Neg Hx   . Rectal cancer Neg Hx   . Colon cancer Neg Hx   . Breast cancer Neg Hx      Social History   Socioeconomic History  . Marital status: Married    Spouse name: Not on file  . Number of children: Not on file  . Years of education: Not  on file  . Highest education level: Not on file  Occupational History  . Not on file  Tobacco Use  . Smoking status: Former Smoker    Packs/day: 0.25    Years: 34.00    Pack years: 8.50  . Smokeless  tobacco: Never Used  Vaping Use  . Vaping Use: Never used  Substance and Sexual Activity  . Alcohol use: Yes    Comment: occassional  . Drug use: No  . Sexual activity: Not on file  Other Topics Concern  . Not on file  Social History Narrative  . Not on file   Social Determinants of Health   Financial Resource Strain: Not on file  Food Insecurity: Not on file  Transportation Needs: Not on file  Physical Activity: Not on file  Stress: Not on file  Social Connections: Not on file  Intimate Partner Violence: Not on file     Allergies  Allergen Reactions  . Sulfa Antibiotics Hives  . Valsartan     Severe headaches     Outpatient Medications Prior to Visit  Medication Sig Dispense Refill  . aspirin EC 81 MG tablet Take 1 tablet (81 mg total) by mouth daily. 60 tablet 3  . metoprolol succinate (TOPROL-XL) 50 MG 24 hr tablet Take 1 tablet (50 mg total) by mouth daily. Take with or immediately following a meal. 90 tablet 1  . traMADol (ULTRAM) 50 MG tablet Take 1-2 tablets (50-100 mg total) by mouth daily as needed. 20 tablet 0  . atorvastatin (LIPITOR) 40 MG tablet Take 1 tablet (40 mg total) by mouth daily at 6 PM. 90 tablet 1  . clobetasol cream (TEMOVATE) AB-123456789 % Apply 1 application topically 2 (two) times daily. 30 g 0  . EUTHYROX 50 MCG tablet Take 2 tablets by mouth once daily (Patient taking differently: Take 50 mcg by mouth daily before breakfast.) 90 tablet 2  . HYDROcodone-acetaminophen (NORCO) 5-325 MG tablet Take 1-2 tablets by mouth daily as needed. (Patient not taking: Reported on 04/20/2021) 20 tablet 0  . valsartan (DIOVAN) 160 MG tablet Take 160 mg by mouth daily. (Patient not taking: Reported on 04/20/2021)     No facility-administered medications prior to visit.       Review of Systems  Cardiovascular: Negative for chest pain and palpitations.  Neurological: Negative for tremors, facial asymmetry, speech difficulty, weakness, light-headedness, numbness and headaches.  Psychiatric/Behavioral: Negative for decreased concentration, dysphoric mood, self-injury, sleep disturbance and suicidal ideas. The patient is not nervous/anxious and is not hyperactive.         Objective:   Physical Exam  There were no vitals filed for this visit. No exam this is a phone note No results found.     Assessment & Plan:  I personally reviewed all images and lab data in the [facility] system as well as any outside material available during thi  office visit and agree with the  radiology impressions.   Essential hypertension Recent blood pressures obtained at orthopedics are normal continue current medications  Hypothyroidism Hypothyroidism stable continue with the current dose of levothyroxine  Skin rash Continue Temovate  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) tear of right shoulder Complex degenerative disease of the right shoulder  MRI pending  Care per orthopedics she may yet have to quit her current job and go on disability from this perspective   Hyperlipidemia Continue statin  History of TIA (transient ischemic attack) Continue statin and aspirin therapy  Tobacco use    . Current smoking consumption amount: Down to ZERO cigarettes daily  . Dicsussion on advise to quit smoking and smoking impacts: Importance of quitting smoking due to impacts on cardiovascular health and stroke recurrence  . Patient's willingness to quit: Is willing to quit  Methods to quit smoking  discussed: Continue nicotine replacement . Medication management of smoking session drugs discussed: I have prescribed nicotine replacement therapy  . Resources provided:  AVS   . Setting quit date currently not smoking  . Follow-up arranged follow-up OV1  mo   Time spent counseling the patient: 5 minutes    Diagnoses and all orders for this visit:  History of TIA (transient ischemic attack) -     atorvastatin (LIPITOR) 40 MG tablet; Take 1 tablet (40 mg total) by mouth daily at 6 PM.  Essential hypertension  Postoperative hypothyroidism  Skin rash  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) tear of right shoulder  Mixed hyperlipidemia  Tobacco use  Other orders -     clobetasol cream (TEMOVATE) 0.05 %; Apply 1 application topically 2 (two) times daily. -     levothyroxine (EUTHYROX) 50 MCG tablet; Take 2 tablets (100 mcg total) by mouth daily before breakfast.     I spent 28 minutes non-face-to-face time on this phone visit with this patient including complex decision making Follow Up Instructions: Patient knows a direct exam will be scheduled in June   I discussed the assessment and treatment plan with the patient. The patient was provided an opportunity to ask questions and all were answered. The patient agreed with the plan and demonstrated an understanding of the instructions.   The patient was advised to call back or seek an in-person evaluation if the symptoms worsen or if the condition fails to improve as anticipated.  I provided 28 minutes of non-face-to-face time during this encounter  including  median intraservice time , review of notes, labs, imaging, medications  and explaining diagnosis and management to the patient .    Saralyn Pilar Lamount Cohen

## 2021-04-20 ENCOUNTER — Encounter: Payer: Self-pay | Admitting: Critical Care Medicine

## 2021-04-20 ENCOUNTER — Ambulatory Visit: Payer: Commercial Managed Care - PPO | Attending: Critical Care Medicine | Admitting: Critical Care Medicine

## 2021-04-20 ENCOUNTER — Other Ambulatory Visit: Payer: Self-pay

## 2021-04-20 DIAGNOSIS — E782 Mixed hyperlipidemia: Secondary | ICD-10-CM

## 2021-04-20 DIAGNOSIS — E890 Postprocedural hypothyroidism: Secondary | ICD-10-CM

## 2021-04-20 DIAGNOSIS — I10 Essential (primary) hypertension: Secondary | ICD-10-CM | POA: Diagnosis not present

## 2021-04-20 DIAGNOSIS — Z720 Tobacco use: Secondary | ICD-10-CM

## 2021-04-20 DIAGNOSIS — S43431A Superior glenoid labrum lesion of right shoulder, initial encounter: Secondary | ICD-10-CM

## 2021-04-20 DIAGNOSIS — Z8673 Personal history of transient ischemic attack (TIA), and cerebral infarction without residual deficits: Secondary | ICD-10-CM | POA: Diagnosis not present

## 2021-04-20 DIAGNOSIS — R21 Rash and other nonspecific skin eruption: Secondary | ICD-10-CM | POA: Diagnosis not present

## 2021-04-20 MED ORDER — ATORVASTATIN CALCIUM 40 MG PO TABS: 40.0000 mg | ORAL_TABLET | Freq: Every day | ORAL | 1 refills | Status: DC

## 2021-04-20 MED ORDER — LEVOTHYROXINE SODIUM 50 MCG PO TABS: 100.0000 ug | ORAL_TABLET | Freq: Every day | ORAL | 4 refills | Status: DC

## 2021-04-20 MED ORDER — CLOBETASOL PROPIONATE 0.05 % EX CREA: 1.0000 "application " | TOPICAL_CREAM | Freq: Two times a day (BID) | CUTANEOUS | Status: DC

## 2021-04-20 NOTE — Assessment & Plan Note (Signed)
  .   Current smoking consumption amount: Down to ZERO cigarettes daily  . Dicsussion on advise to quit smoking and smoking impacts: Importance of quitting smoking due to impacts on cardiovascular health and stroke recurrence  . Patient's willingness to quit: Is willing to quit  Methods to quit smoking discussed: Continue nicotine replacement . Medication management of smoking session drugs discussed: I have prescribed nicotine replacement therapy  . Resources provided:  AVS   . Setting quit date currently not smoking  . Follow-up arranged follow-up OV1 mo   Time spent counseling the patient: 5 minutes

## 2021-04-20 NOTE — Assessment & Plan Note (Signed)
Hypothyroidism stable continue with the current dose of levothyroxine

## 2021-04-20 NOTE — Assessment & Plan Note (Signed)
Continue statin and aspirin therapy

## 2021-04-20 NOTE — Assessment & Plan Note (Signed)
Continue Temovate

## 2021-04-20 NOTE — Assessment & Plan Note (Signed)
Recent blood pressures obtained at orthopedics are normal continue current medications

## 2021-04-20 NOTE — Assessment & Plan Note (Signed)
Continue statin. 

## 2021-04-20 NOTE — Assessment & Plan Note (Signed)
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

## 2021-04-21 ENCOUNTER — Telehealth: Payer: Self-pay | Admitting: Orthopaedic Surgery

## 2021-04-21 NOTE — Telephone Encounter (Signed)
Faxed last office note to [employer]ax [phone]  Called and notified patient.

## 2021-04-21 NOTE — Telephone Encounter (Signed)
Pt called stating she needs her office notes from 04/15/21 faxed to [employer] so they can keep her work status up to date. The pt would like [place] to fax this and states we should have the fax number to [employer] because we've been handling her case and she would like a CB when that's been done please.  [phone]

## 2021-04-26 ENCOUNTER — Ambulatory Visit
Admission: RE | Admit: 2021-04-26 | Discharge: 2021-04-26 | Disposition: A | Discharge status: Home / Self Care | Payer: Commercial Managed Care - PPO | Source: Ambulatory Visit | Attending: Orthopaedic Surgery | Admitting: Orthopaedic Surgery

## 2021-04-26 ENCOUNTER — Other Ambulatory Visit: Payer: Self-pay

## 2021-04-26 DIAGNOSIS — Z9889 Other specified postprocedural states: Secondary | ICD-10-CM

## 2021-04-29 ENCOUNTER — Ambulatory Visit: Payer: Self-pay

## 2021-04-29 ENCOUNTER — Other Ambulatory Visit: Payer: Self-pay

## 2021-04-29 ENCOUNTER — Ambulatory Visit (INDEPENDENT_AMBULATORY_CARE_PROVIDER_SITE_OTHER): Payer: Commercial Managed Care - PPO | Admitting: Orthopaedic Surgery

## 2021-04-29 ENCOUNTER — Telehealth: Payer: Self-pay | Admitting: Orthopaedic Surgery

## 2021-04-29 DIAGNOSIS — G8929 Other chronic pain: Secondary | ICD-10-CM

## 2021-04-29 DIAGNOSIS — M25511 Pain in right shoulder: Secondary | ICD-10-CM

## 2021-04-29 DIAGNOSIS — Z9889 Other specified postprocedural states: Secondary | ICD-10-CM

## 2021-04-29 NOTE — Telephone Encounter (Signed)
04/15/21 ov note faxed to [employer] [phone]

## 2021-04-29 NOTE — Progress Notes (Signed)
Office Visit Note   Patient: Meagan Flores           Date of Birth: 06-11-1966           MRN: [id number] Visit Date: 04/29/2021              Requested by: Elsie Stain, MD [address],  [place] [address] PCP: Elsie Stain, MD   Assessment & Plan: Visit Diagnoses:  1. Chronic right shoulder pain   2. S/P arthroscopy of right shoulder     Plan: MRI of the right shoulder shows severe tendinosis of the supraspinatus with insertional interstitial tear as well as severe tendinosis of the infraspinatus.  No other structural findings.  Based on the MRI findings and the level pain that she is in we need to first rule out cervical spine pathology.  I explained that and hope that debriding the rotator cuff would be sufficient to provide her with relief but sounds like she has not gotten much relief from the surgery.  We will first get the cervical spine MRI to rule that out as a source of her pain but we will may need to consider rotator cuff repair based on lack of relief from the prior surgery.  Given her symptoms and shoulder dysfunction I have kept her out of work since February 18 and she needs to continue to remain out of work until we have a more definitive treatment plan.  All the documentation was provided today.  We will see her back after the MRI.  Follow-Up Instructions: Return for Follow-up after cervical spine MRI..   Orders:  Orders Placed This Encounter  Procedures  . XR Cervical Spine 2 or 3 views   No orders of the defined types were placed in this encounter.     Procedures: No procedures performed   Clinical Data: No additional findings.   Subjective: Chief Complaint  Patient presents with  . Right Shoulder - Follow-up    [name] returns today for MRI review of the right shoulder.  She continues to have severe pain with activity and use of the right shoulder.  She feels pain and muscular tightness in the trapezius and neck region.   Review of  Systems   Objective: Vital Signs: There were no vitals taken for this visit.  Physical Exam  Ortho Exam Manual muscle testing of the right shoulder is difficult secondary to guarding and pain.  She can actively abduct her shoulder to about 55 degrees before she feels a catching pain.  She has difficulty cooperating with exam secondary to pain.  Cervical spine is slightly tender.  Negative Spurling's. Specialty Comments:  No specialty comments available.  Imaging: XR Cervical Spine 2 or 3 views  Result Date: 04/29/2021 Mild multilevel degenerative disc disease.  Anterior osteophytes of the vertebral bodies    PMFS History: Patient Active Problem List   Diagnosis Date Noted  . Skin rash 02/17/2021  . Hypothyroidism   . Preop cardiovascular exam 11/25/2020  . Pulmonary hypertension, unspecified ([place]) 11/25/2020  . Prediabetes   . Hyperlipidemia   . 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) tear of right shoulder 09/30/2020  . Rotator cuff impingement syndrome of right shoulder 09/30/2020  . 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 Tendinopathy of right biceps tendon 09/30/2020  . 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 Aneurysm ([place]) 03/18/2020  . History of TIA (transient ischemic attack) 02/14/2020  . COVID-19 virus infection 02/14/2020  . History of stroke   .  Mixed hyperlipidemia   . Essential hypertension   . Acute pain of left shoulder 09/08/2017  . Impingement syndrome of right shoulder 12/24/2016  . Tobacco use 09/28/2016  . Glenohumeral arthritis 12/27/2014  . 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   Past Medical History:  Diagnosis Date  . Acute pain of left shoulder 09/08/2017  . Aneurysm ([place]) 03/18/2020  . 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 COVID-19 virus infection 02/14/2020  . 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) tear of right shoulder 09/30/2020  . Essential hypertension   .  Glenohumeral arthritis 12/27/2014  . History of stroke   . History of TIA (transient ischemic attack) 02/14/2020  . Hyperlipidemia   . Hypothyroidism   . Impingement syndrome of right shoulder 12/24/2016  . Mixed hyperlipidemia   . 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 Prediabetes   . Preop cardiovascular exam 11/25/2020  . Pulmonary hypertension, unspecified ([place]) 11/25/2020  . 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 Rotator cuff impingement syndrome of right shoulder 09/30/2020  . Skin rash 02/17/2021  . Stroke ([place])   . Tendinopathy of right biceps tendon 09/30/2020  . Thyroid disease   . TIA (transient ischemic attack) 04/15/2018  . Tobacco use 09/28/2016    Family History  Problem Relation Age of Onset  . Stomach cancer Sister   . Colon polyps Sister   . Esophageal cancer Neg Hx   . Rectal cancer Neg Hx   . Colon cancer Neg Hx   . Breast cancer Neg Hx     Past Surgical History:  Procedure Laterality Date  . PARTIAL HYSTERECTOMY    . right arm sx    . SHOULDER ARTHROSCOPY WITH BICEPSTENOTOMY Right 01/21/2021   Procedure: SHOULDER ARTHROSCOPY WITH BICEPSTENOTOMY;  Surgeon: Leandrew Koyanagi, MD;  Location: [facility];  Service: Orthopedics;  Laterality: Right;  . SHOULDER ARTHROSCOPY WITH DISTAL CLAVICLE RESECTION Right 01/12/2017   Procedure: RIGHT SHOULDER ARTHROSCOPY WITH  SUBACROMIAL DECOMPRESSION, DISTAL CLAVICLE EXCISION, extensive debridement;  Surgeon: Leandrew Koyanagi, MD;  Location: [facility];  Service: Orthopedics;  Laterality: Right;  . THYMECTOMY  2004  . THYROIDECTOMY     Social History   Occupational History  . Not on file  Tobacco Use  . Smoking status: Former Smoker    Packs/day: 0.25    Years: 34.00    Pack years: 8.50  . Smokeless tobacco: Never Used  Vaping Use  . Vaping Use: Never used  Substance and Sexual Activity  . Alcohol use: Yes    Comment: occassional  . Drug use: No  . Sexual activity: Not on file

## 2021-05-11 ENCOUNTER — Other Ambulatory Visit: Payer: Self-pay | Admitting: Orthopaedic Surgery

## 2021-05-11 ENCOUNTER — Other Ambulatory Visit: Payer: Self-pay

## 2021-05-11 DIAGNOSIS — M542 Cervicalgia: Secondary | ICD-10-CM

## 2021-05-19 ENCOUNTER — Other Ambulatory Visit: Payer: Self-pay

## 2021-05-19 ENCOUNTER — Ambulatory Visit
Admission: RE | Admit: 2021-05-19 | Discharge: 2021-05-19 | Disposition: A | Discharge status: Home / Self Care | Payer: Commercial Managed Care - PPO | Source: Ambulatory Visit | Attending: Orthopaedic Surgery | Admitting: Orthopaedic Surgery

## 2021-05-19 DIAGNOSIS — M542 Cervicalgia: Secondary | ICD-10-CM

## 2021-05-22 ENCOUNTER — Ambulatory Visit (INDEPENDENT_AMBULATORY_CARE_PROVIDER_SITE_OTHER): Payer: Commercial Managed Care - PPO | Admitting: Orthopaedic Surgery

## 2021-05-22 ENCOUNTER — Encounter: Payer: Self-pay | Admitting: Orthopaedic Surgery

## 2021-05-22 DIAGNOSIS — M542 Cervicalgia: Secondary | ICD-10-CM | POA: Diagnosis not present

## 2021-05-22 NOTE — Progress Notes (Signed)
Office Visit Note   Patient: Meagan Flores           Date of Birth: 22-Sep-1966           MRN: [id number] Visit Date: 05/22/2021              Requested by: Elsie Stain, MD [address],  [place] [address] PCP: Elsie Stain, MD   Assessment & Plan: Visit Diagnoses:  1. Neck pain     Plan: MRI of the cervical spine shows disc degeneration at C3-4, C4-5, C5-6.  There is facet disease and neuroforaminal narrowing.  Given these findings I have recommended evaluation and ESI by Dr.Newton.  Questions encouraged and answered.  Follow-Up Instructions: Return if symptoms worsen or fail to improve.   Orders:  Orders Placed This Encounter  Procedures   Ambulatory referral to Physical Medicine Rehab   No orders of the defined types were placed in this encounter.     Procedures: No procedures performed   Clinical Data: No additional findings.   Subjective: Chief Complaint  Patient presents with   Neck - Pain    Meagan Flores returns today for MRI review of the cervical spine.  Continues to report right shoulder pain.   Review of Systems   Objective: Vital Signs: There were no vitals taken for this visit.  Physical Exam  Ortho Exam Exam is unchanged. Specialty Comments:  No specialty comments available.  Imaging: No results found.   PMFS History: Patient Active Problem List   Diagnosis Date Noted   Skin rash 02/17/2021   Hypothyroidism    Preop cardiovascular exam 11/25/2020   Pulmonary hypertension, unspecified ([place]) 11/25/2020   Prediabetes    Hyperlipidemia    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) tear of right shoulder 09/30/2020   Rotator cuff impingement syndrome of right shoulder 09/30/2020   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   Tendinopathy of right biceps tendon 09/30/2020   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   Aneurysm ([place]) 03/18/2020   History of TIA (transient ischemic attack) 02/14/2020    COVID-19 virus infection 02/14/2020   History of stroke    Mixed hyperlipidemia    Essential hypertension    Acute pain of left shoulder 09/08/2017   Impingement syndrome of right shoulder 12/24/2016   Tobacco use 09/28/2016   Glenohumeral arthritis 12/27/2014   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   Past Medical History:  Diagnosis Date   Acute pain of left shoulder 09/08/2017   Aneurysm ([place]) 03/18/2020   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   COVID-19 virus infection 02/14/2020   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) tear of right shoulder 09/30/2020   Essential hypertension    Glenohumeral arthritis 12/27/2014   History of stroke    History of TIA (transient ischemic attack) 02/14/2020   Hyperlipidemia    Hypothyroidism    Impingement syndrome of right shoulder 12/24/2016   Mixed hyperlipidemia    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   Prediabetes    Preop cardiovascular exam 11/25/2020   Pulmonary hypertension, unspecified ([place]) 11/25/2020   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   Rotator cuff impingement syndrome of right shoulder 09/30/2020   Skin rash 02/17/2021   Stroke [facility])    Tendinopathy of right biceps tendon 09/30/2020   Thyroid disease    TIA (transient ischemic attack) 04/15/2018   Tobacco use 09/28/2016    Family History  Problem Relation Age of Onset   Stomach cancer Sister    Colon polyps Sister    Esophageal cancer  Neg Hx    Rectal cancer Neg Hx    Colon cancer Neg Hx    Breast cancer Neg Hx     Past Surgical History:  Procedure Laterality Date   PARTIAL HYSTERECTOMY     right arm sx     SHOULDER ARTHROSCOPY WITH BICEPSTENOTOMY Right 01/21/2021   Procedure: SHOULDER ARTHROSCOPY WITH BICEPSTENOTOMY;  Surgeon: Leandrew Koyanagi, MD;  Location: [place];  Service: Orthopedics;  Laterality: Right;   SHOULDER ARTHROSCOPY WITH DISTAL CLAVICLE RESECTION Right 01/12/2017   Procedure: RIGHT SHOULDER ARTHROSCOPY WITH   SUBACROMIAL DECOMPRESSION, DISTAL CLAVICLE EXCISION, extensive debridement;  Surgeon: Leandrew Koyanagi, MD;  Location: [place];  Service: Orthopedics;  Laterality: Right;   THYMECTOMY  2004   THYROIDECTOMY     Social History   Occupational History   Not on file  Tobacco Use   Smoking status: Former    Packs/day: 0.25    Years: 34.00    Pack years: 8.50    Types: Cigarettes   Smokeless tobacco: Never  Vaping Use   Vaping Use: Never used  Substance and Sexual Activity   Alcohol use: Yes    Comment: occassional   Drug use: No   Sexual activity: Not on file

## 2021-06-11 ENCOUNTER — Encounter: Payer: Self-pay | Admitting: Physical Medicine and Rehabilitation

## 2021-06-11 ENCOUNTER — Other Ambulatory Visit: Payer: Self-pay

## 2021-06-11 ENCOUNTER — Telehealth: Payer: Self-pay | Admitting: Physical Medicine and Rehabilitation

## 2021-06-11 ENCOUNTER — Ambulatory Visit: Payer: Self-pay

## 2021-06-11 ENCOUNTER — Ambulatory Visit (INDEPENDENT_AMBULATORY_CARE_PROVIDER_SITE_OTHER): Payer: Commercial Managed Care - PPO | Admitting: Physical Medicine and Rehabilitation

## 2021-06-11 VITALS — BP 118/80 | HR 73

## 2021-06-11 DIAGNOSIS — M5412 Radiculopathy, cervical region: Secondary | ICD-10-CM | POA: Diagnosis not present

## 2021-06-11 MED ORDER — BETAMETHASONE SOD PHOS & ACET 6 (3-3) MG/ML IJ SUSP
12.0000 mg | Freq: Once | INTRAMUSCULAR | Status: AC
  Administered 2021-06-11: 12 mg

## 2021-06-11 NOTE — Patient Instructions (Signed)

## 2021-06-11 NOTE — Telephone Encounter (Signed)
Can you tell me who this needs to go to? Patient was just seen, so there is not note in yet.

## 2021-06-11 NOTE — Telephone Encounter (Signed)
PT states ov note needs to be faxed to [facility] Fax # : [phone]

## 2021-06-11 NOTE — Progress Notes (Signed)
Meagan Flores - 55 y.o. female MRN [id number]  Date of birth: 03-25-1966  Office Visit Note: Visit Date: 06/11/2021 PCP: Elsie Stain, MD Referred by: Elsie Stain, MD  Subjective: Chief Complaint  Patient presents with   Neck - Pain   Right Shoulder - Pain   HPI:  Meagan Flores is a 55 y.o. female who comes in today at the request of Dr. Eduard Roux for planned Right C7-T1 Cervical Interlaminar epidural steroid injection with fluoroscopic guidance.  The patient has failed conservative care including home exercise, medications, time and activity modification.  This injection will be diagnostic and hopefully therapeutic.  Please see requesting physician notes for further details and justification. MRI reviewed with images and spine model.  MRI reviewed in the note below.  Continued neck and right shoulder pain some referral to both sides but mainly right shoulder pain worse with movement particularly abduction forward flexion.  No paresthesias or tingling.  MRI does show significant narrowing but no high-grade cord compression or nerve compression.     ROS Otherwise per HPI.  Assessment & Plan: Visit Diagnoses:    ICD-10-CM   1. Cervical radiculopathy  M54.12 XR C-ARM NO REPORT    Epidural Steroid injection    betamethasone acetate-betamethasone sodium phosphate (CELESTONE) injection 12 mg      Plan: No additional findings.   Meds & Orders:  Meds ordered this encounter  Medications   betamethasone acetate-betamethasone sodium phosphate (CELESTONE) injection 12 mg    Orders Placed This Encounter  Procedures   XR C-ARM NO REPORT   Epidural Steroid injection    Follow-up: No follow-ups on file.   Procedures: No procedures performed  Cervical Epidural Steroid Injection - Interlaminar Approach with Fluoroscopic Guidance  Patient: Meagan Flores      Date of Birth: 07-16-1966 MRN: [id number] PCP: Elsie Stain, MD      Visit Date: 06/11/2021   Universal Protocol:     Date/Time: 07/15/226:01 AM  Consent Given By: the patient  Position: PRONE  Additional Comments: Vital signs were monitored before and after the procedure. Patient was prepped and draped in the usual sterile fashion. The correct patient, procedure, and site was verified.   Injection Procedure Details:   Procedure diagnoses: Cervical radiculopathy [M54.12]    Meds Administered:  Meds ordered this encounter  Medications   betamethasone acetate-betamethasone sodium phosphate (CELESTONE) injection 12 mg     Laterality: Right  Location/Site: C7-T1  Needle: 3.5 in., 20 ga. Tuohy  Needle Placement: Paramedian epidural space  Findings:  -Comments: Excellent flow of contrast into the epidural space.  Procedure Details: Using a paramedian approach from the side mentioned above, the region overlying the inferior lamina was localized under fluoroscopic visualization and the soft tissues overlying this structure were infiltrated with 4 ml. of 1% Lidocaine without Epinephrine. A # 20 gauge, Tuohy needle was inserted into the epidural space using a paramedian approach.  The epidural space was localized using loss of resistance along with contralateral oblique bi-planar fluoroscopic views.  After negative aspirate for air, blood, and CSF, a 2 ml. volume of Isovue-250 was injected into the epidural space and the flow of contrast was observed. Radiographs were obtained for documentation purposes.   The injectate was administered into the level noted above.  Additional Comments:  The patient tolerated the procedure well Dressing: 2 x 2 sterile gauze and Band-Aid    Post-procedure details: Patient was observed during the procedure. Post-procedure instructions were reviewed.  Patient left the clinic in stable condition.   Clinical History: MRI CERVICAL SPINE WITHOUT CONTRAST   TECHNIQUE: Multiplanar, multisequence MR imaging of the cervical spine was performed. No intravenous  contrast was administered.   COMPARISON:  Cervical spine radiographs 04/29/2021. Report from cervical spine MRI 06/15/2000 (images unavailable).   FINDINGS: Alignment: Reversal of the expected cervical lordosis. Trace C5-C6 grade 1 retrolisthesis.   Vertebrae: Vertebral body height is maintained. No significant marrow edema or focal suspicious osseous lesion. Multilevel ventral osteophytes most prominent at C3-C4, C4-C5 and C5-C6.   Cord: No spinal cord signal abnormality is identified.   Posterior Fossa, vertebral arteries, paraspinal tissues: No abnormality identified within included portions of the posterior fossa. Flow voids preserved within the imaged cervical vertebral arteries. Paraspinal soft tissues within normal limits.   Disc levels:   Multilevel disc degeneration. Most notably, disc degeneration is moderate to moderately advanced at C4-C5 and moderate at C3-C4 and C5-C6.   C2-C3: Moderate-sized broad-based central disc protrusion. The disc protrusion contributes to mild spinal canal stenosis with likely contact upon the ventral spinal cord. No significant foraminal stenosis.   C3-C4: Disc bulge with endplate spurring and left greater than right disc osteophyte ridge/uncinate hypertrophy. Superimposed small central disc protrusion. Facet arthrosis. Mild/moderate spinal canal stenosis. The disc protrusion contacts and minimally flattens the ventral spinal cord. Bilateral neural foraminal narrowing (mild right, moderate left).   C4-C5: Disc bulge with endplate spurring and uncovertebral hypertrophy. Facet arthrosis. Mild relative spinal canal narrowing with likely contact upon the ventral spinal cord. Mild relative bilateral neural foraminal narrowing.   C5-C6: Disc bulge with endplate spurring and uncovertebral hypertrophy. Facet arthrosis. Mild relative spinal canal narrowing with likely contact upon the ventral spinal cord. Bilateral neural foraminal  narrowing (mild right, moderate left).   C6-C7: Facet arthrosis (predominantly on the left). No significant disc herniation or stenosis.   C7-T1: No significant disc herniation or stenosis.   IMPRESSION: Cervical spondylosis, as outlined and with findings most notably as follows.   At C3-C4, there is multifactorial mild/moderate spinal canal stenosis. A disc protrusion contacts and minimally flattens the ventral spinal cord. Bilateral neural foraminal narrowing (mild right, moderate left).   At C2-C3, a moderate-sized central disc protrusion contributes to mild spinal canal stenosis, contacting upon the ventral spinal cord.   No more than mild relative spinal canal narrowing at the remaining levels. Additional sites of neural foraminal narrowing, as detailed and greatest on the left at C5-C6 (moderate at this site).   Disc degeneration is greatest at C3-C4 (moderate), C4-C5 (moderate/advanced) and C5-C6 (moderate).   Reversal of the expected cervical lordosis.   Trace C5-C6 grade 1 retrolisthesis.     Electronically Signed   By: Kellie Simmering DO   On: 05/20/2021 10:22     Objective:  VS:  HT:    WT:   BMI:     BP:118/80  HR:73bpm  TEMP: ( )  RESP:  Physical Exam Vitals and nursing note reviewed.  Constitutional:      General: She is not in acute distress.    Appearance: Normal appearance. She is not ill-appearing.  HENT:     Head: Normocephalic and atraumatic.     Right Ear: External ear normal.     Left Ear: External ear normal.  Eyes:     Extraocular Movements: Extraocular movements intact.  Cardiovascular:     Rate and Rhythm: Normal rate.     Pulses: Normal pulses.  Musculoskeletal:     Cervical back:  Tenderness present. No rigidity.     Right lower leg: No edema.     Left lower leg: No edema.     Comments: Patient has good strength in the upper extremities including 5 out of 5 strength in wrist extension long finger flexion and APB.  Painful range of  motion of the right shoulder with abduction and forward flexion.  There is no atrophy of the hands intrinsically.  There is a negative Hoffmann's test.   Lymphadenopathy:     Cervical: No cervical adenopathy.  Skin:    Findings: No erythema, lesion or rash.  Neurological:     General: No focal deficit present.     Mental Status: She is alert and oriented to person, place, and time.     Sensory: No sensory deficit.     Motor: No weakness or abnormal muscle tone.     Coordination: Coordination normal.  Psychiatric:        Mood and Affect: Mood normal.        Behavior: Behavior normal.     Imaging: XR C-ARM NO REPORT  Result Date: 06/11/2021 Please see Notes tab for imaging impression.

## 2021-06-11 NOTE — Progress Notes (Signed)
Pt state neck pain that travels to her right shoulder. Pt state lifting her arm makes the pain worse. Pt state she takes pain meds and uses heating and ice to help ease her pain. Pt state she uses a pillow to help sleep at night.  Numeric Pain Rating Scale and Functional Assessment Average Pain 7   In the last MONTH (on 0-10 scale) has pain interfered with the following?  1. General activity like being  able to carry out your everyday physical activities such as walking, climbing stairs, carrying groceries, or moving a chair?  Rating(10)   +Driver, -BT, -Dye Allergies.

## 2021-06-11 NOTE — Telephone Encounter (Signed)
I will fax ov note when dictated.

## 2021-06-12 NOTE — Procedures (Signed)
Cervical Epidural Steroid Injection - Interlaminar Approach with Fluoroscopic Guidance  Patient: Meagan Flores      Date of Birth: [birth year] MRN: [id number] PCP: Elsie Stain, MD      Visit Date: 06/11/2021   Universal Protocol:    Date/Time: 07/15/226:01 AM  Consent Given By: the patient  Position: PRONE  Additional Comments: Vital signs were monitored before and after the procedure. Patient was prepped and draped in the usual sterile fashion. The correct patient, procedure, and site was verified.   Injection Procedure Details:   Procedure diagnoses: Cervical radiculopathy [M54.12]    Meds Administered:  Meds ordered this encounter  Medications   betamethasone acetate-betamethasone sodium phosphate (CELESTONE) injection 12 mg     Laterality: Right  Location/Site: C7-T1  Needle: 3.5 in., 20 ga. Tuohy  Needle Placement: Paramedian epidural space  Findings:  -Comments: Excellent flow of contrast into the epidural space.  Procedure Details: Using a paramedian approach from the side mentioned above, the region overlying the inferior lamina was localized under fluoroscopic visualization and the soft tissues overlying this structure were infiltrated with 4 ml. of 1% Lidocaine without Epinephrine. A # 20 gauge, Tuohy needle was inserted into the epidural space using a paramedian approach.  The epidural space was localized using loss of resistance along with contralateral oblique bi-planar fluoroscopic views.  After negative aspirate for air, blood, and CSF, a 2 ml. volume of Isovue-250 was injected into the epidural space and the flow of contrast was observed. Radiographs were obtained for documentation purposes.   The injectate was administered into the level noted above.  Additional Comments:  The patient tolerated the procedure well Dressing: 2 x 2 sterile gauze and Band-Aid    Post-procedure details: Patient was observed during the procedure. Post-procedure  instructions were reviewed.  Patient left the clinic in stable condition.

## 2021-06-12 NOTE — Telephone Encounter (Signed)
06/11/21 ov note faxed to [facility] [phone]

## 2021-06-17 ENCOUNTER — Other Ambulatory Visit: Payer: Self-pay

## 2021-06-17 ENCOUNTER — Encounter: Payer: Self-pay | Admitting: Physician Assistant

## 2021-06-17 ENCOUNTER — Ambulatory Visit: Payer: Commercial Managed Care - PPO | Attending: Physician Assistant | Admitting: Physician Assistant

## 2021-06-17 DIAGNOSIS — R110 Nausea: Secondary | ICD-10-CM | POA: Diagnosis not present

## 2021-06-17 MED ORDER — ONDANSETRON HCL 4 MG PO TABS: 4.0000 mg | ORAL_TABLET | Freq: Three times a day (TID) | ORAL | 0 refills | Status: DC | PRN

## 2021-06-17 NOTE — Progress Notes (Signed)
Virtual Visit via Telephone Note  I connected with Meagan Flores on 06/17/21 at 10:30 AM EDT by telephone and verified that I am speaking with the correct person using two identifiers.  Location: Patient: home Provider: [facility] office  I discussed the limitations, risks, security and privacy concerns of performing an evaluation and management service by telephone and the availability of in person appointments. I also discussed with the patient that there may be a patient responsible charge related to this service. The patient expressed understanding and agreed to proceed.   History of Present Illness:  patient scheduled appt for nausea for several days.  No vomiting.  She has an appt scheduled with Dr Joya Gaskins on 06/29/2021.  No abdominal pain.  She does c/o fatigue and weight gain and isn't sure if her thyroid med dose is therapeutic.  She denies fever.  No urinary s/sx.  Had an injection in her shoulder last week but it isn't helping yet.     Observations/Objective:  NAD.  A&Ox3   Assessment and Plan: 1. Nausea Drink ample water, rest - ondansetron (ZOFRAN) 4 MG tablet; Take 1 tablet (4 mg total) by mouth every 8 (eight) hours as needed for nausea or vomiting.  Dispense: 20 tablet; Refill: 0  Follow Up Instructions: See Dr Joya Gaskins as planned as she is due for labs   I discussed the assessment and treatment plan with the patient. The patient was provided an opportunity to ask questions and all were answered. The patient agreed with the plan and demonstrated an understanding of the instructions.   The patient was advised to call back or seek an in-person evaluation if the symptoms worsen or if the condition fails to improve as anticipated.  I provided 14 minutes of non-face-to-face time during this encounter.   Freeman Caldron, PA-C  Patient ID: Meagan Flores, female   DOB: 1966/10/01, 55 y.o.   MRN: [id number]

## 2021-06-28 NOTE — Progress Notes (Deleted)
Subjective:    Patient ID: Meagan Flores, female    DOB: 07/15/1966, 55 y.o.   MRN: [id number] History of Present Illness: 02/14/20 This is a 55 year old female who was in the emergency department on 9 March with symptoms consistent with viral infection.  The patient initially came in with general malaise fever muscle aches and pain headaches and dizziness loss of taste and smell Covid test was positive the patient was not hypoxic chest x-ray did not show infiltrates patient was given supportive care fluid hydration and then discharged later in the day.  This is a post ER follow-up visit and also to establish in the clinic as the patient does not have a primary care provider.  This patient states she is improving over the last 24 hours.  Previously over the last several days she had episodes of diarrhea nausea and vomiting without abdominal pain.  She still lost her sense of taste.  She states that she has had cough is paroxysmal but her shortness of breath is resolved urine output is adequate urine is not concentrated.  She states her muscle aches and pains are resolving she still has some pain in the back and hurts some in the legs she also still has some fatigue.  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  Former smoker now only smoking the occasional amounts.  Previous history of stroke and had been on aspirin 81 mg daily and lipid therapy.  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  The only active medicine she has at this time includes over-the-counter zinc and vitamin C and she also has the Synthroid 125 mcg daily.  4/20 This patient returns today in follow-up after the first visit being a telehealth visit.  The patient did have Covid viral infection and is rapidly improved from this.  She still has some fatigue.  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  She is smoking now pack of cigarettes every 3 days.  Note the patient comes in today with adequate blood pressure and not requiring blood pressure medications.  The patient is on Synthroid 125 mcg daily without symptoms of hypothyroidism at this time.  Also is on aspirin and atorvastatin for stroke prevention  This patient has suffered lost during the pandemic and that her son was murdered in January she lost both her parents several months apart late in 2020 she is suffering grief reaction from these losses  04/15/2020 Patient seen in return follow-up and has had complete resolution of the COVID-19 viral infection.  The patient has not yet received the Covid vaccine and is giving this consideration.  Patient states her biggest issue now is right arm and elbow pain.  Her right dominant hand is numb in the ulnar distribution.  She had a cubital fossa surgery impingement release previously by a hand surgeon several years ago in fact almost 10 years ago.  On this occurred on the right elbow and also she has had impingement syndrome of the acromioclavicular joint with surgery arthroscopy done in 2018 by orthopedics locally.  She states her arm is hurting worse now.  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  Patient continues to smoke about a pack every 3 days.  He denies any shortness of breath cough or other respiratory  complaints.  She is wishing to have referral back to orthopedics.  Note at the last visit her thyroid function was normal and she is on Synthroid replacement.  Blood pressure at this visit is well controlled at 117/80.  07/29/2020 The patient is seen in return follow-up for hypertension, glenohumeral arthritis of the right shoulder, tobacco use.  Patient states in the interim she is still smoking 1 pack every 4 days of cigarettes.  She has difficulty falling asleep and will sleep late into the morning.  She is noted increased headaches and  palpitations.  She pulled a muscle in her left upper chest recently.  She never received the nicotine replacement therapy rep we recommended.  She is due a mammogram.  She notes increased stress anxiety and dysphoria.  She is not been on antidepressants before and is interested in receiving this.  She notes her right shoulder is still having some pain to take with movement.  She uses Tylenol for this and this seems to help.  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  Note her PHQ 9 score is very elevated at this visit.  09/10/2020 This is a telephone visit for this 55 year old female seen in follow-up after had previously been seen in August of this year.  The patient states she is having difficulty with her thyroid medication.  She states since being on the 125 mcg of the Synthroid she is feeling more jittery.  She has lost weight not intentionally.  She has lost her appetite.  She feels her heart is racing.  She is down to 7 to 8 cigarettes daily.  She still has right shoulder pain which is chronic.  She has a history of acromioclavicular joint impingement syndrome and continues to try to work under these conditions.  Her anxiety appears to be unchanged despite being on sertraline 50 mg daily.  10/21/2020 Patient is seen in return follow-up visit history of postoperative hypothyroidism we reduced her dose to 7 thyroid 100 mcg daily at the last visit she is now here for follow-up labs  Also history of glenohumeral arthritis with nerve impingement syndrome and tendinosis of the biceps tendon had she has been seen by orthopedics and she will need to undergo orthoscopic surgery of the shoulder per Dr. 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  She has a cardiology visit today for this.  She denies any significant hypersomnolence or snoring.  Patient is also yet to receive her mammogram.  She also is due a Covid booster.  Patient denies any other symptoms at this time.  Her ankle is improving and she just got out of the ankle boot.  She is still smoking 2 to 4 cigarettes daily.     02/17/2021 Patient seen in return follow-up hypertension is well controlled on arrival blood pressure 124/83.  She does state however she had side effects from the valsartan and had to stop the medication she is try to follow good diet and exercise  Patient complains of left shoulder pain that is new in onset  she has previously had surgery on the right shoulder with MRIs  Patient's smoking is down to 1 pack every 4 days  5/23 This patient presents today by way of a phone visit.  She has had recent arthroscopy of the right shoulder and has been slow to recover.  She has had extensive outpatient physical therapy without improvements.  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  She is not able to return to work because of her orthopedic disability.  She has an MRI that is pending to see what the status of her arthritis is in the right shoulder.  She is compliant with her thyroid medication, patient is no longer smoking tobacco products.  No other stroke symptoms.  She is compliant with all her other medications.  She needs refills on all her medications.  Patient is on tramadol for pain per orthopedics.  She states the rash in her forearm is still persistent.  She states the steroid cream helps to some degree.  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  MRI pending  Care per orthopedics she may yet have to quit her current job and go on disability from this perspective   Hyperlipidemia Continue statin  History of TIA (transient ischemic attack) Continue statin and aspirin therapy  Tobacco use   Past Medical History:  Diagnosis Date   Acute pain of left shoulder 09/08/2017   Aneurysm ([place]) 03/18/2020   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   COVID-19 virus infection 02/14/2020   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) tear of right shoulder 09/30/2020   Essential hypertension    Glenohumeral arthritis 12/27/2014   History of stroke    History of TIA (transient ischemic attack) 02/14/2020   Hyperlipidemia    Hypothyroidism    Impingement syndrome of right shoulder 12/24/2016   Mixed hyperlipidemia    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   Prediabetes    Preop cardiovascular exam 11/25/2020   Pulmonary hypertension, unspecified ([place]) 11/25/2020   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   Rotator cuff impingement syndrome of right shoulder 09/30/2020   Skin rash 02/17/2021   Stroke [facility])    Tendinopathy of right biceps tendon 09/30/2020   Thyroid disease    TIA (transient ischemic attack) 04/15/2018   Tobacco use 09/28/2016     Family History  Problem Relation Age of Onset   Stomach cancer Sister    Colon polyps Sister    Esophageal cancer Neg Hx    Rectal cancer Neg Hx    Colon cancer Neg Hx    Breast cancer Neg Hx      Social History   Socioeconomic History   Marital status: Married    Spouse name: Not on file   Number of children: Not on file   Years of education: Not on file   Highest education level: Not on file  Occupational History   Not on file  Tobacco Use   Smoking status:  Former    Packs/day: 0.25    Years: 34.00    Pack years: 8.50    Types: Cigarettes   Smokeless tobacco: Never  Vaping Use   Vaping Use: Never used  Substance and Sexual Activity   Alcohol use: Yes    Comment: occassional  Drug use: No   Sexual activity: Not on file  Other Topics Concern   Not on file  Social History Narrative   Not on file   Social Determinants of Health   Financial Resource Strain: Not on file  Food Insecurity: Not on file  Transportation Needs: Not on file  Physical Activity: Not on file  Stress: Not on file  Social Connections: Not on file  Intimate Partner Violence: Not on file     Allergies  Allergen Reactions   Sulfa Antibiotics Hives   Valsartan     Severe headaches     Outpatient Medications Prior to Visit  Medication Sig Dispense Refill   aspirin EC 81 MG tablet Take 1 tablet (81 mg total) by mouth daily. 60 tablet 3   atorvastatin (LIPITOR) 40 MG tablet Take 1 tablet (40 mg total) by mouth daily at 6 PM. 90 tablet 1   clobetasol cream (TEMOVATE) [id number] % Apply 1 application topically 2 (two) times daily. 30 g 03   levothyroxine (EUTHYROX) 50 MCG tablet Take 2 tablets (100 mcg total) by mouth daily before breakfast. 60 tablet 4   metoprolol succinate (TOPROL-XL) 50 MG 24 hr tablet Take 1 tablet (50 mg total) by mouth daily. Take with or immediately following a meal. 90 tablet 1   ondansetron (ZOFRAN) 4 MG tablet Take 1 tablet (4 mg total) by mouth every 8 (eight) hours as needed for nausea or vomiting. 20 tablet 0   traMADol (ULTRAM) 50 MG tablet Take 1-2 tablets (50-100 mg total) by mouth daily as needed. 20 tablet 0   No facility-administered medications prior to visit.      Review of Systems      Objective:   Physical Exam  There were no vitals filed for this visit. No exam this is a phone note No results found.     Assessment & Plan:  I personally reviewed all images and lab data in the [facility] system as well as any outside  material available during thi  office visit and agree with the  radiology impressions.   No problem-specific Assessment & Plan notes found for this encounter.   There are no diagnoses linked to this encounter.    I spent 28 minutes non-face-to-face time on this phone visit with this patient including complex decision making Follow Up Instructions: Patient knows a direct exam will be scheduled in June   I discussed the assessment and treatment plan with the patient. The patient was provided an opportunity to ask questions and all were answered. The patient agreed with the plan and demonstrated an understanding of the instructions.   The patient was advised to call back or seek an in-person evaluation if the symptoms worsen or if the condition fails to improve as anticipated.  I provided 28 minutes of non-face-to-face time during this encounter  including  median intraservice time , review of notes, labs, imaging, medications  and explaining diagnosis and management to the patient .    Saralyn Pilar Lamount Cohen

## 2021-06-29 ENCOUNTER — Ambulatory Visit: Payer: Commercial Managed Care - PPO | Attending: Critical Care Medicine | Admitting: Critical Care Medicine

## 2021-06-29 ENCOUNTER — Other Ambulatory Visit: Payer: Self-pay

## 2021-06-29 ENCOUNTER — Telehealth: Payer: Self-pay | Admitting: Critical Care Medicine

## 2021-06-29 ENCOUNTER — Encounter: Payer: Self-pay | Admitting: Critical Care Medicine

## 2021-06-29 VITALS — BP 121/79 | HR 75 | Wt 213.0 lb

## 2021-06-29 DIAGNOSIS — I10 Essential (primary) hypertension: Secondary | ICD-10-CM | POA: Diagnosis not present

## 2021-06-29 DIAGNOSIS — E890 Postprocedural hypothyroidism: Secondary | ICD-10-CM

## 2021-06-29 DIAGNOSIS — Z23 Encounter for immunization: Secondary | ICD-10-CM | POA: Diagnosis not present

## 2021-06-29 DIAGNOSIS — S43431A Superior glenoid labrum lesion of right shoulder, initial encounter: Secondary | ICD-10-CM

## 2021-06-29 DIAGNOSIS — Z8673 Personal history of transient ischemic attack (TIA), and cerebral infarction without residual deficits: Secondary | ICD-10-CM

## 2021-06-29 DIAGNOSIS — Z720 Tobacco use: Secondary | ICD-10-CM

## 2021-06-29 DIAGNOSIS — F17210 Nicotine dependence, cigarettes, uncomplicated: Secondary | ICD-10-CM

## 2021-06-29 MED ORDER — LEVOTHYROXINE SODIUM 50 MCG PO TABS: 100.0000 ug | ORAL_TABLET | Freq: Every day | ORAL | 4 refills | Status: DC

## 2021-06-29 MED ORDER — ATORVASTATIN CALCIUM 40 MG PO TABS: 40.0000 mg | ORAL_TABLET | Freq: Every day | ORAL | 1 refills | Status: DC

## 2021-06-29 MED ORDER — METOPROLOL SUCCINATE ER 50 MG PO TB24
50.0000 mg | ORAL_TABLET | Freq: Every day | ORAL | 1 refills | Status: DC
Start: 2021-06-29 — End: 2021-09-29

## 2021-06-29 MED ORDER — CLOBETASOL PROPIONATE 0.05 % EX CREA: 1.0000 "application " | TOPICAL_CREAM | Freq: Two times a day (BID) | CUTANEOUS | 3 refills | Status: DC

## 2021-06-29 NOTE — Assessment & Plan Note (Signed)
Stable today and patient denies acute changes to energy levels. Plan to check TSH today.

## 2021-06-29 NOTE — Assessment & Plan Note (Addendum)
Chronic right shoulder pain and paresthesias of 4th and 5th digits. Failed steroid injection provided by ortho and no longer going to PT. Plan to reevaluate by ortho and told patient to advocate that the injections were not helpful. She is also managed by pain management who prescribe tramadol '50mg'$ .

## 2021-06-29 NOTE — Patient Instructions (Signed)
A pneumonia vaccine was given  Refills on all of your medication sent to your [facility] pharmacy including your metoprolol , take this daily for your palpitations  Labs today: thyroid function  Return to Dr Joya Gaskins 3 months  Focus on smoking cessation, use a nicotine patch or lozenge, see attachment, and a referral to [place] our Engineer, maintenance will be made

## 2021-06-29 NOTE — Assessment & Plan Note (Signed)
Continue baby aspirin and atorvastatin '40mg'$ . No new focal deficits observed during today's visit. Stable exam.

## 2021-06-29 NOTE — Telephone Encounter (Signed)
Needs appt with you for generalized anxiety and smoking cessatoin counseling  Not urgent

## 2021-06-29 NOTE — Progress Notes (Signed)
Established Patient Office Visit  Subjective:  Patient ID: Meagan Flores, female    DOB: 10-08-1966  Age: 55 y.o. MRN: [id number]  CC:  Chief Complaint  Patient presents with   Follow-up    HPI ROMAISA CHARO presents for a routine follow up on her medications and chronic conditions. She has a history of chronic right shoulder pain and was diagnosed with a SLAP tear and severe tendinosis of right supraspinatus and infraspinatus tendon from MRI in May. She also underwent a MRI of cervical spine in June which shows cervical spinal stenosis at C3-4, C4-5, and C5-6. Her orthopedist performed a steroid injection to her shoulder but it did not provide any pain relief. She takes tylenol every day for her pain and supplements with tramadol '50mg'$  provided by pain management. She works at a [employer] and performs repetitive motions. She has been out of work since February of this year. Because of the increased stress and shoulder pain, she has resumed smoking and 1 pack will last every 3-4 days. She has quit in the past without nicotine replacement therapy. After her steroid injection, she was seen in the office here for increased nausea and was treated with PO zofran with relief.   She reports that she is not taking her valsartan or metoprolol due to intolerance. She took one pill of each and then discontinued. She has a history of palpitations and has worn a Zio patch which documented multiple palpations and tachycardia as high as 180.  She continues to take her aspirin, atorvastatin and synthroid every day. She recalls in the past having bouts of low energy but currently denies.   Past Medical History:  Diagnosis Date   Acute pain of left shoulder 09/08/2017   Aneurysm ([place]) 03/18/2020   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   COVID-19 virus infection 02/14/2020   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) tear of right shoulder 09/30/2020   Essential  hypertension    Glenohumeral arthritis 12/27/2014   History of stroke    History of TIA (transient ischemic attack) 02/14/2020   Hyperlipidemia    Hypothyroidism    Impingement syndrome of right shoulder 12/24/2016   Mixed hyperlipidemia    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   Prediabetes    Preop cardiovascular exam 11/25/2020   Pulmonary hypertension, unspecified ([place]) 11/25/2020   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   Rotator cuff impingement syndrome of right shoulder 09/30/2020   Skin rash 02/17/2021   Stroke [facility])    Tendinopathy of right biceps tendon 09/30/2020   Thyroid disease    TIA (transient ischemic attack) 04/15/2018   Tobacco use 09/28/2016    Past Surgical History:  Procedure Laterality Date   PARTIAL HYSTERECTOMY     right arm sx     SHOULDER ARTHROSCOPY WITH BICEPSTENOTOMY Right 01/21/2021   Procedure: SHOULDER ARTHROSCOPY WITH BICEPSTENOTOMY;  Surgeon: Leandrew Koyanagi, MD;  Location: [place];  Service: Orthopedics;  Laterality: Right;   SHOULDER ARTHROSCOPY WITH DISTAL CLAVICLE RESECTION Right 01/12/2017   Procedure: RIGHT SHOULDER ARTHROSCOPY WITH  SUBACROMIAL DECOMPRESSION, DISTAL CLAVICLE EXCISION, extensive debridement;  Surgeon: Leandrew Koyanagi, MD;  Location: [place];  Service: Orthopedics;  Laterality: Right;   THYMECTOMY  2004   THYROIDECTOMY      Family History  Problem Relation Age of Onset   Stomach cancer Sister    Colon polyps Sister    Esophageal cancer Neg Hx    Rectal cancer Neg Hx    Colon  cancer Neg Hx    Breast cancer Neg Hx     Social History   Socioeconomic History   Marital status: Married    Spouse name: Not on file   Number of children: Not on file   Years of education: Not on file   Highest education level: Not on file  Occupational History   Not on file  Tobacco Use   Smoking status: Every Day    Packs/day: 0.50    Years: 34.00    Pack years: 17.00    Types: Cigarettes   Smokeless tobacco:  Never  Vaping Use   Vaping Use: Never used  Substance and Sexual Activity   Alcohol use: Yes    Comment: occassional   Drug use: No   Sexual activity: Not on file  Other Topics Concern   Not on file  Social History Narrative   Not on file   Social Determinants of Health   Financial Resource Strain: Not on file  Food Insecurity: Not on file  Transportation Needs: Not on file  Physical Activity: Not on file  Stress: Not on file  Social Connections: Not on file  Intimate Partner Violence: Not on file    Outpatient Medications Prior to Visit  Medication Sig Dispense Refill   aspirin EC 81 MG tablet Take 1 tablet (81 mg total) by mouth daily. 60 tablet 3   ondansetron (ZOFRAN) 4 MG tablet Take 1 tablet (4 mg total) by mouth every 8 (eight) hours as needed for nausea or vomiting. 20 tablet 0   traMADol (ULTRAM) 50 MG tablet Take 1-2 tablets (50-100 mg total) by mouth daily as needed. 20 tablet 0   atorvastatin (LIPITOR) 40 MG tablet Take 1 tablet (40 mg total) by mouth daily at 6 PM. 90 tablet 1   clobetasol cream (TEMOVATE) [id number] % Apply 1 application topically 2 (two) times daily. 30 g 03   levothyroxine (EUTHYROX) 50 MCG tablet Take 2 tablets (100 mcg total) by mouth daily before breakfast. 60 tablet 4   metoprolol succinate (TOPROL-XL) 50 MG 24 hr tablet Take 1 tablet (50 mg total) by mouth daily. Take with or immediately following a meal. (Patient not taking: Reported on 06/29/2021) 90 tablet 1   valsartan (DIOVAN) 160 MG tablet Take 160 mg by mouth daily. (Patient not taking: Reported on 06/29/2021)     No facility-administered medications prior to visit.    Allergies  Allergen Reactions   Sulfa Antibiotics Hives   Valsartan     Severe headaches    ROS Review of Systems  Constitutional:  Negative for chills, fatigue and fever.  HENT: Negative.    Eyes: Negative.   Respiratory: Negative.    Cardiovascular:  Positive for palpitations. Negative for chest pain and leg  swelling.  Gastrointestinal: Negative.   Genitourinary: Negative.   Musculoskeletal:  Positive for arthralgias. Negative for neck pain and neck stiffness.  Neurological:  Positive for weakness (in right hand) and numbness (right hand 4th and 5th digit).  Psychiatric/Behavioral: Negative.       Objective:    Physical Exam Constitutional:      General: She is not in acute distress.    Appearance: Normal appearance. She is obese.  HENT:     Mouth/Throat:     Mouth: Mucous membranes are moist.     Pharynx: Oropharynx is clear.  Eyes:     Pupils: Pupils are equal, round, and reactive to light.  Cardiovascular:     Rate and Rhythm: Normal  rate and regular rhythm.     Pulses: Normal pulses.     Heart sounds: Normal heart sounds.  Pulmonary:     Effort: Pulmonary effort is normal.     Breath sounds: Normal breath sounds.  Abdominal:     General: Abdomen is flat.     Palpations: Abdomen is soft.  Musculoskeletal:     Cervical back: Neck supple.  Skin:    General: Skin is warm and dry.  Neurological:     General: No focal deficit present.     Mental Status: She is alert.  Psychiatric:        Mood and Affect: Mood normal.        Behavior: Behavior normal.    BP 121/79   Pulse 75   Wt 213 lb (96.6 kg)   SpO2 94%   BMI 32.39 kg/m  Wt Readings from Last 3 Encounters:  06/29/21 213 lb (96.6 kg)  04/01/21 210 lb (95.3 kg)  02/17/21 202 lb 3.2 oz (91.7 kg)     Health Maintenance Due  Topic Date Due   Zoster Vaccines- Shingrix (1 of 2) Never done   Pneumococcal Vaccine 77-98 Years old (2 - PCV) 09/28/2018   INFLUENZA VACCINE  06/29/2021    There are no preventive care reminders to display for this patient.  Lab Results  Component Value Date   TSH 3.300 10/21/2020   Lab Results  Component Value Date   WBC 7.3 11/04/2020   HGB 11.7 (L) 11/04/2020   HCT 36.3 11/04/2020   MCV 74.8 (L) 11/04/2020   PLT 233 11/04/2020   Lab Results  Component Value Date   NA 140  11/04/2020   K 3.5 11/04/2020   CO2 26 11/04/2020   GLUCOSE 113 (H) 11/04/2020   BUN 15 11/04/2020   CREATININE 0.78 11/04/2020   BILITOT 0.4 11/04/2020   ALKPHOS 69 11/04/2020   AST 16 11/04/2020   ALT 13 11/04/2020   PROT 7.4 11/04/2020   ALBUMIN 3.8 11/04/2020   CALCIUM 8.1 (L) 11/04/2020   ANIONGAP 11 11/04/2020   Lab Results  Component Value Date   CHOL 203 (H) 04/15/2018   Lab Results  Component Value Date   HDL 45 04/15/2018   Lab Results  Component Value Date   LDLCALC 130 (H) 04/15/2018   Lab Results  Component Value Date   TRIG 140 04/15/2018   Lab Results  Component Value Date   CHOLHDL 4.5 04/15/2018   Lab Results  Component Value Date   HGBA1C 5.8 (A) 10/21/2020      Assessment & Plan:   Problem List Items Addressed This Visit       Cardiovascular and Mediastinum   Essential hypertension - Primary    Stable in office. She took one dose of valsartan from last appointment and self discontinued due to intolerance. Patient attributes elevations in BP to shoulder pain and emotional mood. Plan to stay off valsartan but to resume metoprolol due to history of palpitations from Zio patch. BP was 121/79 in office off medications.        Relevant Medications   atorvastatin (LIPITOR) 40 MG tablet   metoprolol succinate (TOPROL-XL) 50 MG 24 hr tablet     Endocrine   Postoperative hypothyroidism    Stable today and patient denies acute changes to energy levels. Plan to check TSH today.        Relevant Medications   levothyroxine (EUTHYROX) 50 MCG tablet   metoprolol succinate (TOPROL-XL) 50 MG 24  hr tablet   Other Relevant Orders   Thyroid Panel With TSH     Musculoskeletal and Integument   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) tear of right shoulder    Chronic right shoulder pain and paresthesias of 4th and 5th digits. Failed steroid injection provided by ortho and no longer going to PT. Plan to reevaluate by ortho and told  patient to advocate that the injections were not helpful. She is also managed by pain management who prescribe tramadol '50mg'$ .          Other   Tobacco use    Patient has resumed cigarette use and one pack will last her 3 days. Attributes to increased stress and pain with right shoulder tendenopathy. Recommended nicotine replacement therapy     Current smoking consumption amount: 1 pk every three days  Dicsussion on advise to quit smoking and smoking impacts: cv impacts  Patient's willingness to quit:  Wants to quit  Methods to quit smoking discussed:  Nicotine replacment  Medication management of smoking session drugs discussed: nicotine patch  Resources provided:  AVS   Setting quit date  Not established  Follow-up arranged  Dr Joya Gaskins 3 months, referral to LCSW for counseling   Time spent counseling the patient:  5 min        History of TIA (transient ischemic attack)    Continue baby aspirin and atorvastatin '40mg'$ . No new focal deficits observed during today's visit. Stable exam.        Relevant Medications   atorvastatin (LIPITOR) 40 MG tablet   Other Visit Diagnoses     Need for pneumococcal vaccination       Relevant Orders   Pneumococcal conjugate vaccine 20-valent (Prevnar 20) (Completed)       Meds ordered this encounter  Medications   atorvastatin (LIPITOR) 40 MG tablet    Sig: Take 1 tablet (40 mg total) by mouth daily at 6 PM.    Dispense:  90 tablet    Refill:  1   levothyroxine (EUTHYROX) 50 MCG tablet    Sig: Take 2 tablets (100 mcg total) by mouth daily before breakfast.    Dispense:  60 tablet    Refill:  4   clobetasol cream (TEMOVATE) 0.05 %    Sig: Apply 1 application topically 2 (two) times daily.    Dispense:  30 g    Refill:  3   metoprolol succinate (TOPROL-XL) 50 MG 24 hr tablet    Sig: Take 1 tablet (50 mg total) by mouth daily. Take with or immediately following a meal.    Dispense:  90 tablet    Refill:  1   Pneumonia  vaccine PCV20  Follow-up: Return in about 3 months (around 09/29/2021).    Asencion Noble, MD

## 2021-06-29 NOTE — Assessment & Plan Note (Addendum)
Stable in office. She took one dose of valsartan from last appointment and self discontinued due to intolerance. Patient attributes elevations in BP to shoulder pain and emotional mood. Plan to stay off valsartan but to resume metoprolol due to history of palpitations from Zio patch. BP was 121/79 in office off medications.

## 2021-06-29 NOTE — Assessment & Plan Note (Addendum)
Patient has resumed cigarette use and one pack will last her 3 days. Attributes to increased stress and pain with right shoulder tendenopathy. Recommended nicotine replacement therapy     . Current smoking consumption amount: 1 pk every three days  . Dicsussion on advise to quit smoking and smoking impacts: cv impacts  . Patient's willingness to quit:  Wants to quit  . Methods to quit smoking discussed:  Nicotine replacment  . Medication management of smoking session drugs discussed: nicotine patch  . Resources provided:  AVS   . Setting quit date  Not established  . Follow-up arranged  Dr Joya Gaskins 3 months, referral to LCSW for counseling   Time spent counseling the patient:  5 min

## 2021-06-30 LAB — THYROID PANEL WITH TSH
Free Thyroxine Index: 2.8 (ref 1.2–4.9)
T3 Uptake Ratio: 28 % (ref 24–39)
T4, Total: 10 ug/dL (ref 4.5–12.0)
TSH: 6.99 u[IU]/mL — ABNORMAL HIGH (ref 0.450–4.500)

## 2021-07-03 ENCOUNTER — Ambulatory Visit (INDEPENDENT_AMBULATORY_CARE_PROVIDER_SITE_OTHER): Payer: Commercial Managed Care - PPO | Admitting: Orthopaedic Surgery

## 2021-07-03 ENCOUNTER — Other Ambulatory Visit: Payer: Self-pay

## 2021-07-03 ENCOUNTER — Encounter: Payer: Self-pay | Admitting: Orthopaedic Surgery

## 2021-07-03 ENCOUNTER — Ambulatory Visit: Payer: Self-pay

## 2021-07-03 VITALS — Ht 68.0 in | Wt 213.0 lb

## 2021-07-03 DIAGNOSIS — G8929 Other chronic pain: Secondary | ICD-10-CM | POA: Diagnosis not present

## 2021-07-03 DIAGNOSIS — M25511 Pain in right shoulder: Secondary | ICD-10-CM

## 2021-07-03 MED ORDER — TRAMADOL HCL 50 MG PO TABS: 50.0000 mg | ORAL_TABLET | Freq: Four times a day (QID) | ORAL | 0 refills | Status: DC | PRN

## 2021-07-03 NOTE — Progress Notes (Signed)
Office Visit Note   Patient: Meagan Flores           Date of Birth: 11/27/1966           MRN: [id number] Visit Date: 07/03/2021              Requested by: Elsie Stain, MD [address],  [place] [id number] PCP: Elsie Stain, MD   Assessment & Plan: Visit Diagnoses:  1. Chronic right shoulder pain     Plan: Impression is chronic right shoulder pain.  After thorough review of the MRI of her right shoulder along with arthroscopic photos in addition to the clinical exam, it is thought that her pain may be coming from her biceps tendon which has been tenotomized.  We referred her to Dr. Junius Roads for ultrasound-guided cortisone injection to this area.  She will follow-up with [place] in 2 weeks time for recheck.  I also reviewed the MRI scan with my partner Dr. Marlou Sa who does not see an obvious reason for her continued severe pain.  Follow-Up Instructions: Return in about 2 weeks (around 07/17/2021).   Orders:  Orders Placed This Encounter  Procedures   US Guided Needle Placement - No Linked Charges    Meds ordered this encounter  Medications   traMADol (ULTRAM) 50 MG tablet    Sig: Take 1 tablet (50 mg total) by mouth every 6 (six) hours as needed.    Dispense:  60 tablet    Refill:  0       Procedures: No procedures performed   Clinical Data: No additional findings.   Subjective: Chief Complaint  Patient presents with   Neck - Follow-up    HPI patient is a pleasant 55 year old female who comes in today with continued right shoulder pain.  She is status post right shoulder arthroscopic debridement of the labrum, supraspinatus, subscapularis and infraspinatus as well as biceps tenotomy, date of surgery 01/21/2021.  She continued to have pain and limitations with range of motion following surgery.  She was sent to Dr. Junius Roads for ultrasound-guided cortisone injection which was performed on 02/25/2021 and 03/04/2021 both without significant relief of symptoms.   Subsequent MRI of the right shoulder ordered which showed severe tendinosis of the supraspinatus and infraspinatus with a partial insertional interstitial tear of the supraspinatus.  It was then thought that she could have a radicular component from her neck so subsequent cervical spine MRI was ordered which showed disc degeneration at C3-4, C4-5 and C5-6.  Additionally, there is facet disease and neuroforaminal narrowing.  She was referred to Dr. Ernestina Patches for [facility].  ESI at C7-T1 was performed on 06/11/2021.  Patient did have transient relief to the left shoulder but no relief to the right shoulder during the anesthetic phase.  She continues to have symptoms worse to the anterior and posterior aspects of the right shoulder.  She has associated weakness.  Pain is worse with most motions of her shoulder to include abduction and forward flexion.  She has been taking Tylenol without significant relief.  Of note, she does admit to tingling to the ring and small fingers of the right hand.  Review of Systems as detailed in HPI.  All others reviewed and are negative.   Objective: Vital Signs: Ht '5\' 8"'$  (1.727 m)   Wt 213 lb (96.6 kg)   BMI 32.39 kg/m   Physical Exam well-developed well-nourished female no acute distress.  Alert oriented x3.  Ortho Exam right shoulder exam  is relatively limited secondary to pain.  She has approximately 90 degrees of forward flexion.  Very little internal and external rotation.  Fair amount of weakness with internal and external rotation which I am not sure is true weakness or pain mediated.  She is neurovascular intact distally.  Specialty Comments:  No specialty comments available.  Imaging: No new imaging   PMFS History: Patient Active Problem List   Diagnosis Date Noted   Skin rash 02/17/2021   Hypothyroidism    Preop cardiovascular exam 11/25/2020   Pulmonary hypertension, unspecified ([place]) 11/25/2020   Prediabetes    Hyperlipidemia    Degenerative superior  labral anterior-to-posterior (SLAP) tear of right shoulder 09/30/2020   Rotator cuff impingement syndrome of right shoulder 09/30/2020   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   Tendinopathy of right biceps tendon 09/30/2020   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   Aneurysm ([place]) 03/18/2020   History of TIA (transient ischemic attack) 02/14/2020   History of stroke    Mixed hyperlipidemia    Essential hypertension    Acute pain of left shoulder 09/08/2017   Impingement syndrome of right shoulder 12/24/2016   Tobacco use 09/28/2016   Glenohumeral arthritis 12/27/2014   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   Past Medical History:  Diagnosis Date   Acute pain of left shoulder 09/08/2017   Aneurysm ([place]) 03/18/2020   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   COVID-19 virus infection 02/14/2020   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) tear of right shoulder 09/30/2020   Essential hypertension    Glenohumeral arthritis 12/27/2014   History of stroke    History of TIA (transient ischemic attack) 02/14/2020   Hyperlipidemia    Hypothyroidism    Impingement syndrome of right shoulder 12/24/2016   Mixed hyperlipidemia    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   Prediabetes    Preop cardiovascular exam 11/25/2020   Pulmonary hypertension, unspecified ([place]) 11/25/2020   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   Rotator cuff impingement syndrome of right shoulder 09/30/2020   Skin rash 02/17/2021   Stroke ([place])    Tendinopathy of right biceps tendon 09/30/2020   Thyroid disease    TIA (transient ischemic attack) 04/15/2018   Tobacco use 09/28/2016    Family History  Problem Relation Age of Onset   Stomach cancer Sister    Colon polyps Sister    Esophageal cancer Neg Hx    Rectal cancer Neg Hx    Colon cancer Neg Hx    Breast cancer Neg Hx     Past Surgical History:  Procedure Laterality Date   PARTIAL HYSTERECTOMY     right arm sx     SHOULDER  ARTHROSCOPY WITH BICEPSTENOTOMY Right 01/21/2021   Procedure: SHOULDER ARTHROSCOPY WITH BICEPSTENOTOMY;  Surgeon: Leandrew Koyanagi, MD;  Location: [facility];  Service: Orthopedics;  Laterality: Right;   SHOULDER ARTHROSCOPY WITH DISTAL CLAVICLE RESECTION Right 01/12/2017   Procedure: RIGHT SHOULDER ARTHROSCOPY WITH  SUBACROMIAL DECOMPRESSION, DISTAL CLAVICLE EXCISION, extensive debridement;  Surgeon: Leandrew Koyanagi, MD;  Location: [facility];  Service: Orthopedics;  Laterality: Right;   THYMECTOMY  2004   THYROIDECTOMY     Social History   Occupational History   Not on file  Tobacco Use   Smoking status: Every Day    Packs/day: 0.50    Years: 34.00    Pack years: 17.00    Types: Cigarettes   Smokeless tobacco: Never  Vaping Use   Vaping Use: Never used  Substance and Sexual Activity  Alcohol use: Yes    Comment: occassional   Drug use: No   Sexual activity: Not on file

## 2021-07-03 NOTE — Progress Notes (Signed)
Subjective: She is here for a right shoulder ultrasound-guided long head biceps tendon sheath injection.  Ongoing pain, nothing seems to have helped.  This is mostly for diagnostic purposes.  Objective: She does have point tenderness over the long head biceps tendon.  Procedure: Ultrasound-guided injection: After sterile prep with Betadine, injected 4 cc 1% lidocaine without epinephrine and 40 mg Depo-Medrol into the biceps tendon sheath.  She did not have any significant change during the immediate anesthetic phase.  She will keep note of how she feels over the next several hours and report back to Dr. Erlinda Hong in 2 weeks.

## 2021-07-08 NOTE — Telephone Encounter (Signed)
Appt scheduled for 07/29/21 at 9:00am

## 2021-07-13 ENCOUNTER — Telehealth: Payer: Self-pay

## 2021-07-13 NOTE — Telephone Encounter (Signed)
Patient name and DOB has been verified Patient was informed of lab results. Patient had no questions.  

## 2021-07-13 NOTE — Telephone Encounter (Signed)
-----   Message from Elsie Stain, MD sent at 06/30/2021  6:30 AM EDT ----- Let pt know thyroid function is normal , no change in thyroid dosing

## 2021-07-14 ENCOUNTER — Telehealth: Payer: Self-pay | Admitting: Orthopaedic Surgery

## 2021-07-14 NOTE — Telephone Encounter (Signed)
Received call from patient, needing notes from 8/5 faxed to [employer]. Faxed [phone]

## 2021-07-17 ENCOUNTER — Telehealth: Payer: Self-pay | Admitting: Orthopaedic Surgery

## 2021-07-17 NOTE — Telephone Encounter (Signed)
Received vm from patient checking if we faxed her notes to [email or web address] pt [phone], spoke with her advising 8/5 notes faxed 8/16 when she called requesting that to be faxed.

## 2021-07-22 ENCOUNTER — Encounter: Payer: Self-pay | Admitting: Orthopaedic Surgery

## 2021-07-22 ENCOUNTER — Ambulatory Visit (INDEPENDENT_AMBULATORY_CARE_PROVIDER_SITE_OTHER): Payer: Commercial Managed Care - PPO | Admitting: Orthopaedic Surgery

## 2021-07-22 ENCOUNTER — Other Ambulatory Visit: Payer: Self-pay

## 2021-07-22 DIAGNOSIS — G8929 Other chronic pain: Secondary | ICD-10-CM | POA: Diagnosis not present

## 2021-07-22 DIAGNOSIS — Z9889 Other specified postprocedural states: Secondary | ICD-10-CM | POA: Diagnosis not present

## 2021-07-22 DIAGNOSIS — M25511 Pain in right shoulder: Secondary | ICD-10-CM | POA: Diagnosis not present

## 2021-07-22 MED ORDER — ACETAMINOPHEN-CODEINE #3 300-30 MG PO TABS: 1.0000 | ORAL_TABLET | Freq: Every day | ORAL | 0 refills | Status: AC | PRN

## 2021-07-22 NOTE — Progress Notes (Signed)
Office Visit Note   Patient: Meagan Flores           Date of Birth: May 09, 1966           MRN: [id number] Visit Date: 07/22/2021              Requested by: Elsie Stain, MD [address],  [place] [id number] PCP: Elsie Stain, MD   Assessment & Plan: Visit Diagnoses:  1. Chronic right shoulder pain   2. S/P arthroscopy of right shoulder     Plan: At this point I am puzzled by why she continues to have so much pain.  I wonder if she may have CRPS although this does not feel like it is a classic example of it.  Based on discussion I have recommended second opinion with either Dr. Marlou Sa or Ninfa Linden and hopefully they can help her identify why she continues to be in so much pain.  I prescribed Tylenol 3 today.  She will remain out of work until her next appointment.  Follow-Up Instructions: Return for needs appt for second opinion with Marlou Sa or Ninfa Linden.   Orders:  No orders of the defined types were placed in this encounter.  Meds ordered this encounter  Medications   acetaminophen-codeine (TYLENOL #3) 300-30 MG tablet    Sig: Take 1-2 tablets by mouth daily as needed for up to 7 days for moderate pain.    Dispense:  30 tablet    Refill:  0      Procedures: No procedures performed   Clinical Data: No additional findings.   Subjective: Chief Complaint  Patient presents with   Right Shoulder - Follow-up    HPI Meagan Flores follows up today 2-week status post shoulder injection by Dr. Junius Roads.  She states that she may have felt some modest relief during the anesthetic phase but nothing more than that.  Continues this describes 7 out of 10 pain that is aching throbbing and sharp at all times with muscle spasms.  Continues to be out of work.  Her tramadol has been held up by insurance.  Review of Systems   Objective: Vital Signs: There were no vitals taken for this visit.  Physical Exam  Ortho Exam Right shoulder exam is unchanged.  Continues to have global  pain with attempted range of motion in all planes.  She has pain towards the anterior aspect of the shoulder as well.  Manual muscle testing is limited secondary to pain and guarding. Specialty Comments:  No specialty comments available.  Imaging: No results found.   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   Tendinopathy of right biceps tendon 09/30/2020   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   Aneurysm ([place]) 03/18/2020   History of TIA (transient ischemic attack) 02/14/2020   History of stroke    Mixed hyperlipidemia    Essential hypertension    Acute pain of left shoulder 09/08/2017   Impingement syndrome of right shoulder 12/24/2016   Tobacco use 09/28/2016   Glenohumeral arthritis 12/27/2014   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   Past Medical History:  Diagnosis Date   Acute pain  of left shoulder 09/08/2017   Aneurysm ([place]) 03/18/2020   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   COVID-19 virus infection 02/14/2020   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) tear of right shoulder 09/30/2020   Essential hypertension    Glenohumeral arthritis 12/27/2014   History of stroke    History of TIA (transient ischemic attack) 02/14/2020   Hyperlipidemia    Hypothyroidism    Impingement syndrome of right shoulder 12/24/2016   Mixed hyperlipidemia    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   Prediabetes    Preop cardiovascular exam 11/25/2020   Pulmonary hypertension, unspecified ([place]) 11/25/2020   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   Rotator cuff  impingement syndrome of right shoulder 09/30/2020   Skin rash 02/17/2021   Stroke [facility])    Tendinopathy of right biceps tendon 09/30/2020   Thyroid disease    TIA (transient ischemic attack) 04/15/2018   Tobacco use 09/28/2016    Family History  Problem Relation Age of Onset   Stomach cancer Sister    Colon polyps Sister    Esophageal cancer Neg Hx    Rectal cancer Neg Hx    Colon cancer Neg Hx    Breast cancer Neg Hx     Past Surgical History:  Procedure Laterality Date   PARTIAL HYSTERECTOMY     right arm sx     SHOULDER ARTHROSCOPY WITH BICEPSTENOTOMY Right 01/21/2021   Procedure: SHOULDER ARTHROSCOPY WITH BICEPSTENOTOMY;  Surgeon: Leandrew Koyanagi, MD;  Location: [facility];  Service: Orthopedics;  Laterality: Right;   SHOULDER ARTHROSCOPY WITH DISTAL CLAVICLE RESECTION Right 01/12/2017   Procedure: RIGHT SHOULDER ARTHROSCOPY WITH  SUBACROMIAL DECOMPRESSION, DISTAL CLAVICLE EXCISION, extensive debridement;  Surgeon: Leandrew Koyanagi, MD;  Location: [facility];  Service: Orthopedics;  Laterality: Right;   THYMECTOMY  2004   THYROIDECTOMY     Social History   Occupational History   Not on file  Tobacco Use   Smoking status: Every Day    Packs/day: 0.50    Years: 34.00    Pack years: 17.00    Types: Cigarettes   Smokeless tobacco: Never  Vaping Use   Vaping Use: Never used  Substance and Sexual Activity   Alcohol use: Yes    Comment: occassional   Drug use: No   Sexual activity: Not on file

## 2021-07-29 ENCOUNTER — Other Ambulatory Visit: Payer: Self-pay

## 2021-07-29 ENCOUNTER — Ambulatory Visit: Payer: Commercial Managed Care - PPO | Attending: Critical Care Medicine | Admitting: Clinical

## 2021-07-29 DIAGNOSIS — F411 Generalized anxiety disorder: Secondary | ICD-10-CM

## 2021-07-29 DIAGNOSIS — F330 Major depressive disorder, recurrent, mild: Secondary | ICD-10-CM

## 2021-07-31 ENCOUNTER — Telehealth: Payer: Self-pay | Admitting: Clinical

## 2021-07-31 NOTE — Telephone Encounter (Signed)
Correct. Appt with any provider

## 2021-07-31 NOTE — Telephone Encounter (Signed)
Noted. Do you all mind scheduling a sooner appt with an available provider?

## 2021-07-31 NOTE — BH Specialist Note (Signed)
Integrated Behavioral Health Initial In-Person Visit  MRN: [id number] Name: Meagan Flores  Number of [place] Clinician visits:: 1/6 Session Start time: 9:05am  Session End time: 10:05am Total time: 60 minutes  Types of Service: Individual psychotherapy  Interpretor:No. Interpretor Name and Language: N/A   Warm Hand Off Completed.        Subjective: Meagan Flores is a 55 y.o. female accompanied by  self Patient was referred by PCP Joya Gaskins for anxiety. Patient reports the following symptoms/concerns: Pt reports feeling depressed, feeling tired, anxiousness, difficulty sleeping, and worrying. Reports that both her parents died about two years ago and that she has felt depressed since then. Reports that she has also been unable to work due to a shoulder injury. Reports having difficulty with coping with the changes. Duration of problem: 2 years; Severity of problem: moderate  Objective: Mood: Anxious and Depressed and Affect: Appropriate Risk of harm to self or others: No plan to harm self or others  Life Context: Family and Social: Reports her spouse and children as support system.  School/Work: Reports that she is unemployed due to having a shoulder injury. Reports that her spouse provides financially.  Self-Care: Reports isolating herself from others as a coping skill. Limited healthy coping skills. Life Changes: Reports that her parents passed away about two years ago and she has been trying to adjust. Reports that she has also had a shoulder injury that her caused her to be unable to work.   Patient and/or Family's Strengths/Protective Factors: Concrete supports in place (healthy food, safe environments, etc.)  Goals Addressed: Patient will: Reduce symptoms of: anxiety and depression Increase knowledge and/or ability of: coping skills  Demonstrate ability to: Increase healthy adjustment to current life circumstances and Begin healthy grieving over  loss  Progress towards Goals: Ongoing  Interventions: Interventions utilized: Mindfulness or Psychologist, educational, CBT Cognitive Behavioral Therapy, Supportive Counseling, and Psychoeducation and/or Health Education  Standardized Assessments completed:  MDQ (Mood Disorder Questionnaire), GAD-7, and PHQ 9  Patient and/or Family Response: Pt receptive to tx. Pt receptive to psychoeducation on stages of gried, depression, and anxiety. Pt receptive to cognitive restructruing to improve cognitive processing skills. Pt receptive to relaxation techniques and will begin developing a night routine and incorporating deep breathing exercises.  Patient Centered Plan: Patient is on the following Treatment Plan(s): Depression, Anxiety, and Grief  Assessment: MDQ was negative. Denies SI/HI. Denies auditory/visual hallucinations. No safety risks. Patient currently experiencing depression and anxiety related to grief. Pt is having difficulty accepting the loss of her parents. Pt appears to have irritability related to the hospital where her parents passed. Pt also is having difficulty adjusting to not working as a result of her shoulder injury. Pt appears to isolate herself in order to not deal with her emotions and withhold her feelings from others. Pt has difficulty sleeping as a result of the adjustment and also experiences excess pain.   Patient may benefit from brief therapy and medication management to assist with sleep. LCSWA will inform pt's PCP about medication. LCSWA provided psychoeducation on depression, grief, and anxiety. LCSWA utilized cognitive restructuring to improve pt's cognitive processing skills related to grief. LCSWA encouraged pt to identify a night routine to assist with sleep and incorporate deep breathing exercises. LCSWA will fu with pt.  Plan: Follow up with behavioral health clinician on : 08/19/21 Behavioral recommendations: Utilize deep breathing exercises and develop a night  routine Referral(s): [place] (In Clinic) "From scale of 1-10,  how likely are you to follow plan?": 10  Allysa Governale C Trice Aspinall, LCSW

## 2021-08-04 NOTE — Telephone Encounter (Signed)
Spoke with Patient and she stated she is still taking sleeping medication that was prescribed by Dr. Joya Gaskins. Patient did not want to get a sooner appointment at the time of the call. She stated she would call back at a later time if a sooner appointment is needed.

## 2021-08-10 ENCOUNTER — Other Ambulatory Visit: Payer: Self-pay

## 2021-08-10 ENCOUNTER — Encounter: Payer: Self-pay | Admitting: Orthopaedic Surgery

## 2021-08-10 ENCOUNTER — Ambulatory Visit (INDEPENDENT_AMBULATORY_CARE_PROVIDER_SITE_OTHER): Payer: Commercial Managed Care - PPO | Admitting: Orthopaedic Surgery

## 2021-08-10 DIAGNOSIS — G8929 Other chronic pain: Secondary | ICD-10-CM | POA: Diagnosis not present

## 2021-08-10 DIAGNOSIS — M25511 Pain in right shoulder: Secondary | ICD-10-CM

## 2021-08-10 MED ORDER — NABUMETONE 750 MG PO TABS: 750.0000 mg | ORAL_TABLET | Freq: Two times a day (BID) | ORAL | 1 refills | Status: DC | PRN

## 2021-08-10 MED ORDER — PREGABALIN 75 MG PO CAPS: 75.0000 mg | ORAL_CAPSULE | Freq: Two times a day (BID) | ORAL | 1 refills | Status: DC

## 2021-08-10 NOTE — Progress Notes (Signed)
The patient is a pleasant 55 year old female that I am seeing as a referral for second opinion from one of my partners who sent her our way to just get a new set of eyes on her right shoulder.  She has had 2 arthroscopic interventions for this right shoulder with 1 being in February of 3 years ago and a second surgery in February of this year.  In spite of 2 surgeries combined with physical therapy and time as well as several steroid injections, she still has significant pain with the right shoulder that is activity related.  She does have some pain without activities as well.  She does perform a job that requires her to do a lot of repetitive activities but she has been out of a job for the last 6 months.  She is a prediabetic and has a history of TIAs in the past.  She is on Tylenol 3 for pain.  There is a MRI on the canopy system as well as x-rays for me to review this is of her right shoulder.  She also has a cervical spine MRI.  She had a recent shoulder injection on 07/03/2021 with a steroid that did not really help her at all.  She says when the numbness wore off within a few minutes her pain was back.  She denies any neck pain denies any radicular symptoms going down into her hand.  She currently denies any headache, chest pain, shortness of breath, fever, chills, nausea, vomiting.  She has tried Neurontin she states and that has not helped and caused her to have palpitations she states.  She went to least 18 physical therapy sessions she states.  On inspection of her right shoulder girdle the incisions of healed nicely.  There is no redness at all around her shoulder and no swelling.  There is no blocks to rotation when I put her through some gentle range of motion of the right shoulder but she has significant guarding.  Eventually I was able to get full motion of the shoulder in spite of her guarding and it shows no evidence of arthrofibrosis.  I did review all of the imaging studies and all of the  previous notes in detail.  I would have thought by now she would had significant improvement in her symptoms and be able to get back to some type of level of function.  I metal also while she hurts the way she does not I do not think this is a complex regional pain syndrome based on the exam and what I am seeing.  However she is dealing with chronic pain in general.  I would like to try Relafen 750 mg twice a day as well as start her on Lyrica 75 mg twice a day and see if this will help augment her symptoms and calm things down.  I am out of also what else to recommend.  I think my partner has done everything appropriate in terms of treating her shoulder at this point she may need chronic pain management and to avoid repetitive manual labor and perform mainly sedentary type of work as a relates to the disability of her right shoulder.

## 2021-08-19 ENCOUNTER — Other Ambulatory Visit: Payer: Self-pay

## 2021-08-19 ENCOUNTER — Ambulatory Visit: Payer: Commercial Managed Care - PPO | Attending: Critical Care Medicine | Admitting: Clinical

## 2021-08-19 DIAGNOSIS — F411 Generalized anxiety disorder: Secondary | ICD-10-CM

## 2021-08-19 DIAGNOSIS — F330 Major depressive disorder, recurrent, mild: Secondary | ICD-10-CM

## 2021-08-24 NOTE — BH Specialist Note (Signed)
Integrated Behavioral Health Follow Up In-Person Visit  MRN: [id number] Name: Meagan Flores  Number of [place] Clinician visits: 2/6 Session Start time: 10:00am  Session End time: 11:00am Total time: 60 minutes  Types of Service: Individual psychotherapy  Interpretor:No. Interpretor Name and Language: N/A  Subjective: Meagan Flores is a 55 y.o. female accompanied by  self Patient was referred by PCP Joya Gaskins for anxiety. Patient reports the following symptoms/concerns: Reports feeling tired, depressed at times, difficulty sleeping, trouble relaxing, anxiousness, and excessive worrying. Reports that she continues to feel sad about the death of her parents. Reports that she is frustrated with her health care and continuing to experience pain. Reports that she has not felt properly cared for due to continuous shoulder pain. Reports continued difficulty with coping with the changes.  Duration of problem: 2 years; Severity of problem: moderate  Objective: Mood: Anxious and Depressed and Affect: Appropriate Risk of harm to self or others: No plan to harm self or others  Life Context: Family and Social: Reports her spouse and children as support system.  School/Work: Reports that she is unemployed due to having a shoulder injury. Reports that her spouse provides financially.  Self-Care: Reports that she continues to isolate herself from others.  Life Changes: Reports that her parents passed away about two years ago and she has been trying to adjust. Reports that she has also had a shoulder injury that her caused her to be unable to work.  (No changes to life context)  Patient and/or Family's Strengths/Protective Factors: Concrete supports in place (healthy food, safe environments, etc.)  Goals Addressed: Patient will:  Reduce symptoms of: anxiety and depression   Increase knowledge and/or ability of: coping skills   Demonstrate ability to: Increase healthy adjustment to  current life circumstances and Begin healthy grieving over loss  Progress towards Goals: Ongoing  Interventions: Interventions utilized:  Mindfulness or Psychologist, educational, CBT Cognitive Behavioral Therapy, and Supportive Counseling Standardized Assessments completed: Not Needed  Patient and/or Family Response: Pt receptive to tx. Pt receptive to psychoeducation provided on anxiety, grief, and depression. Pt receptive to cognitive restructuring utilized to improve pt's cognitive processing skills and decrease pt's irritability. Pt receptive to advocating for herself with healthcare providers. Pt receptive to utilizing relaxation techniques and deep breathing exercises. Pt has had difficulty developing a night routine.   Patient Centered Plan: Patient is on the following Treatment Plan(s): Depression, Anxiety, and Grief  Assessment: Denies SI/HI. Denies auditory/visual hallucinations. No safety risks. Patient currently experiencing depression and anxiety related to grief and difficulty adjusting to health circumstances. Pt is having difficulty with advocating for herself and explaining her concerns with health care providers. Pt appears frustrated with continuing to experience pain in her shoulder. Pt has had difficulty with developing a night routine and continues to experience difficulty sleeping due to difficulty relaxing. Pt continues to experience grief.   Patient may benefit from ongoing brief therapy. Pt did not want to adjust any medications with PCP. LCSWA provided psychoeducation on anxiety and its association with physical health. LCSWA provided psychoeducation on depression and grief. LCSWA attempted to normalize pt's irritability with shoulder pain. LCSWA utilized cognitive restructuring and encouraged pt to advocate for herself. LCSWA encouraged pt to utilize relaxation techniques, deep breathing exercises, and establish a night routine. LCSWA will fu with pt.   Plan: Follow up with  behavioral health clinician on : 09/08/21 Behavioral recommendations: Utilize relaxation techniques, deep breathing exercises, establish a night routine, and advocate for  yourself with health care providers. Referral(s): [place] (In Clinic) "From scale of 1-10, how likely are you to follow plan?": 10  Makari Portman C Kamea Dacosta, LCSW

## 2021-08-31 ENCOUNTER — Other Ambulatory Visit: Payer: Self-pay

## 2021-08-31 ENCOUNTER — Ambulatory Visit (INDEPENDENT_AMBULATORY_CARE_PROVIDER_SITE_OTHER): Payer: Self-pay | Admitting: Cardiology

## 2021-08-31 ENCOUNTER — Encounter: Payer: Self-pay | Admitting: Cardiology

## 2021-08-31 VITALS — BP 138/89 | HR 68 | Ht 68.0 in | Wt 215.0 lb

## 2021-08-31 DIAGNOSIS — Z8673 Personal history of transient ischemic attack (TIA), and cerebral infarction without residual deficits: Secondary | ICD-10-CM

## 2021-08-31 DIAGNOSIS — I10 Essential (primary) hypertension: Secondary | ICD-10-CM

## 2021-08-31 DIAGNOSIS — Z720 Tobacco use: Secondary | ICD-10-CM

## 2021-08-31 DIAGNOSIS — R7303 Prediabetes: Secondary | ICD-10-CM

## 2021-08-31 NOTE — Progress Notes (Signed)
Cardiology Office Note:    Date:  08/31/2021   ID:  Meagan Flores, DOB October 27, 1966, MRN [id number]  PCP:  Elsie Stain, MD  Cardiologist:  Jenne Campus, MD    Referring MD: Elsie Stain, MD   No chief complaint on file. I am doing fine  History of Present Illness:    Meagan Flores is a 55 y.o. female with past medical history significant for essential hypertension, dyslipidemia, smoking.  Initially she was referred to [place] because of possibility of pulmonary hypertension however echocardiogram showed normal pulmonary artery pressures with rates this suspicion is the fact that she got enlargement of the pulmonary artery however nothing was find on the echocardiogram done to be worrisome.  She comes today 2 months for follow-up so she is doing well she said that anxiety is acting up lately.  I did do a monitor on her which show some supraventricular tachycardia and I put an extra dose of beta-blocker she takes 50 mg metoprolol t succinate she is feeling much better right now.  Sadly she still continues to smoke.  Denies have any chest pain tightness squeezing pressure burning chest no shortness of breath.  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)    Tendinopathy of right biceps tendon 09/30/2020   Thyroid disease    TIA (transient ischemic attack) 04/15/2018   Tobacco use 09/28/2016    Past Surgical History:  Procedure Laterality Date   PARTIAL HYSTERECTOMY     right arm sx     SHOULDER ARTHROSCOPY WITH BICEPSTENOTOMY Right 01/21/2021   Procedure: SHOULDER ARTHROSCOPY WITH BICEPSTENOTOMY;  Surgeon: Leandrew Koyanagi, MD;  Location: [place];  Service: Orthopedics;  Laterality: Right;   SHOULDER ARTHROSCOPY WITH DISTAL CLAVICLE RESECTION Right 01/12/2017   Procedure: RIGHT SHOULDER ARTHROSCOPY WITH  SUBACROMIAL DECOMPRESSION, DISTAL CLAVICLE EXCISION, extensive debridement;  Surgeon: Leandrew Koyanagi, MD;  Location: [place];  Service: Orthopedics;  Laterality: Right;   THYMECTOMY  2004   THYROIDECTOMY      Current Medications: Current Meds  Medication Sig   acetaminophen-codeine (TYLENOL #3) 300-30 MG tablet Take 1 tablet by mouth every 8 (eight) hours as needed for pain.   aspirin EC 81 MG tablet Take 1 tablet (81 mg total) by mouth daily.   atorvastatin (LIPITOR) 40 MG tablet Take 1 tablet (40 mg total) by mouth daily at 6 PM.   clobetasol cream (TEMOVATE) 6.81 % Apply 1 application topically 2 (two) times daily.   levothyroxine (EUTHYROX) 50 MCG tablet Take 2 tablets (100 mcg total) by mouth daily before breakfast.   metoprolol succinate (TOPROL-XL) 50 MG 24 hr tablet Take 1 tablet (50 mg total) by mouth daily. Take with or immediately following  a meal.   nabumetone (RELAFEN) 750 MG tablet Take 1 tablet (750 mg total) by mouth 2 (two) times daily as needed. (Patient taking differently: Take 750 mg by mouth 2 (two) times daily as needed for moderate pain.)   ondansetron (ZOFRAN) 4 MG tablet Take 1 tablet (4 mg total) by mouth every 8 (eight) hours as needed for nausea or vomiting.   pregabalin (LYRICA) 75 MG capsule Take 1 capsule (75 mg total) by mouth 2  (two) times daily.   valsartan (DIOVAN) 160 MG tablet Take 160 mg by mouth daily.     Allergies:   Sulfa antibiotics and Valsartan   Social History   Socioeconomic History   Marital status: Married    Spouse name: Not on file   Number of children: Not on file   Years of education: Not on file   Highest education level: Not on file  Occupational History   Not on file  Tobacco Use   Smoking status: Every Day    Packs/day: 0.50    Years: 34.00    Pack years: 17.00    Types: Cigarettes   Smokeless tobacco: Never  Vaping Use   Vaping Use: Never used  Substance and Sexual Activity   Alcohol use: Yes    Comment: occassional   Drug use: No   Sexual activity: Not on file  Other Topics Concern   Not on file  Social History Narrative   Not on file   Social Determinants of Health   Financial Resource Strain: Not on file  Food Insecurity: Not on file  Transportation Needs: Not on file  Physical Activity: Not on file  Stress: Not on file  Social Connections: Not on file     Family History: The patient's family history includes Colon polyps in her sister; Stomach cancer in her sister. There is no history of Esophageal cancer, Rectal cancer, Colon cancer, or Breast cancer. ROS:   Please see the history of present illness.    All 14 point review of systems negative except as described per history of present illness  EKGs/Labs/Other Studies Reviewed:      Recent Labs: 11/04/2020: ALT 13; BUN 15; Creatinine, Ser 0.78; Hemoglobin 11.7; Platelets 233; Potassium 3.5; Sodium 140 06/29/2021: TSH 6.990  Recent Lipid Panel    Component Value Date/Time   CHOL 203 (H) 04/15/2018 0058   CHOL 214 (H) 08/05/2017 1645   TRIG 140 04/15/2018 0058   HDL 45 04/15/2018 0058   HDL 51 08/05/2017 1645   CHOLHDL 4.5 04/15/2018 0058   VLDL 28 04/15/2018 0058   LDLCALC 130 (H) 04/15/2018 0058   LDLCALC 145 (H) 08/05/2017 1645    Physical Exam:    VS:  BP 138/89 (BP Location: Left Arm,  Patient Position: Sitting)   Pulse 68   Ht 5\' 8"  (1.727 m)   Wt 215 lb (97.5 kg)   SpO2 98%   BMI 32.69 kg/m     Wt Readings from Last 3 Encounters:  08/31/21 215 lb (97.5 kg)  07/03/21 213 lb (96.6 kg)  06/29/21 213 lb (96.6 kg)     GEN:  Well nourished, well developed in no acute distress HEENT: Normal NECK: No JVD; No carotid bruits LYMPHATICS: No lymphadenopathy CARDIAC: RRR, no murmurs, no rubs, no gallops RESPIRATORY:  Clear to auscultation without rales, wheezing or rhonchi  ABDOMEN: Soft, non-tender, non-distended MUSCULOSKELETAL:  No edema; No deformity  SKIN: Warm and dry LOWER EXTREMITIES: no swelling NEUROLOGIC:  Alert and oriented x 3  PSYCHIATRIC:  Normal affect   ASSESSMENT:    1. Essential hypertension   2. Tobacco use   3. History of stroke   4. Prediabetes    PLAN:    In order of problems listed above:  Essential hypertension blood pressure seems to be controlled continue present management. Tobacco abuse obviously significant problem I spent a great of time talking to her about this she understands need to quit she would like to do that History of CVA denies having new problems Supraventricular tachycardia which is new problem discovered on the monitor.  Treated with beta-blocker successfully no more palpitations. Prediabetes: Followed by antimedicine team   Medication Adjustments/Labs and Tests Ordered: Current medicines are reviewed at length with the patient today.  Concerns regarding medicines are outlined above.  No orders of the defined types were placed in this encounter.  Medication changes: No orders of the defined types were placed in this encounter.   Signed, Park Liter, MD, [facility] 08/31/2021 4:29 PM    [place]

## 2021-08-31 NOTE — Patient Instructions (Signed)

## 2021-09-01 ENCOUNTER — Encounter: Payer: Self-pay | Admitting: Orthopaedic Surgery

## 2021-09-01 ENCOUNTER — Ambulatory Visit (INDEPENDENT_AMBULATORY_CARE_PROVIDER_SITE_OTHER): Payer: Commercial Managed Care - PPO | Admitting: Orthopaedic Surgery

## 2021-09-01 DIAGNOSIS — G8929 Other chronic pain: Secondary | ICD-10-CM

## 2021-09-01 DIAGNOSIS — M25511 Pain in right shoulder: Secondary | ICD-10-CM

## 2021-09-01 DIAGNOSIS — Z9889 Other specified postprocedural states: Secondary | ICD-10-CM

## 2021-09-01 NOTE — Progress Notes (Signed)
Office Visit Note   Patient: Meagan Flores           Date of Birth: 01/11/1966           MRN: [id number] Visit Date: 09/01/2021              Requested by: Elsie Stain, MD [address],  [place] [address] PCP: Elsie Stain, MD   Assessment & Plan: Visit Diagnoses:  1. Chronic right shoulder pain   2. S/P arthroscopy of right shoulder     Plan: [name] returns today for chronic right shoulder pain.  She saw Dr. Ninfa Linden 3 weeks ago as a second opinion.  He also felt that there may be underlying pain syndrome that is causing the chronic shoulder pain.  He prescribed Relafen and Lyrica.  She states that this has not improved her pain.  She is also requesting a referral for a third opinion.  I recommended Dr. Marlou Sa.  She will make that appointment today.  Follow-Up Instructions: Return for needs appt with Dr. Marlou Sa for 2nd opinion for right shoulder pain.   Orders:  No orders of the defined types were placed in this encounter.  No orders of the defined types were placed in this encounter.     Procedures: No procedures performed   Clinical Data: No additional findings.   Subjective: Chief Complaint  Patient presents with   Right Shoulder - Pain    HPI  Review of Systems   Objective: Vital Signs: There were no vitals taken for this visit.  Physical Exam  Ortho Exam  Specialty Comments:  No specialty comments available.  Imaging: No results found.   PMFS History: Patient Active Problem List   Diagnosis Date Noted   Skin rash 02/17/2021   Hypothyroidism    Preop cardiovascular exam 11/25/2020   Pulmonary hypertension, unspecified ([place]) 11/25/2020   Prediabetes    Hyperlipidemia    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) tear of right shoulder 09/30/2020   Rotator cuff impingement syndrome of right shoulder 09/30/2020   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   Tendinopathy of right biceps tendon  09/30/2020   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   Aneurysm ([place]) 03/18/2020   History of TIA (transient ischemic attack) 02/14/2020   History of stroke    Mixed hyperlipidemia    Essential hypertension    Acute pain of left shoulder 09/08/2017   Impingement syndrome of right shoulder 12/24/2016   Tobacco use 09/28/2016   Glenohumeral arthritis 12/27/2014   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   Past Medical History:  Diagnosis Date   Acute pain of left shoulder 09/08/2017   Aneurysm ([place]) 03/18/2020   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   COVID-19 virus infection 02/14/2020   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) tear of right shoulder 09/30/2020   Essential hypertension    Glenohumeral arthritis 12/27/2014   History of stroke    History of TIA (transient ischemic attack) 02/14/2020   Hyperlipidemia    Hypothyroidism    Impingement syndrome of right shoulder 12/24/2016   Mixed hyperlipidemia    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   Prediabetes    Preop cardiovascular exam 11/25/2020   Pulmonary hypertension, unspecified ([place]) 11/25/2020   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   Rotator cuff impingement syndrome of right shoulder 09/30/2020   Skin rash 02/17/2021   Stroke [facility])    Tendinopathy of right biceps tendon 09/30/2020   Thyroid disease    TIA (transient ischemic attack) 04/15/2018   Tobacco use  09/28/2016    Family History  Problem Relation Age of Onset   Stomach cancer Sister    Colon polyps Sister    Esophageal cancer Neg Hx    Rectal cancer Neg Hx    Colon cancer Neg Hx    Breast cancer Neg Hx     Past Surgical History:  Procedure Laterality Date   PARTIAL HYSTERECTOMY     right arm sx     SHOULDER ARTHROSCOPY WITH BICEPSTENOTOMY Right 01/21/2021   Procedure: SHOULDER ARTHROSCOPY WITH BICEPSTENOTOMY;  Surgeon: Leandrew Koyanagi, MD;  Location: [place];  Service: Orthopedics;  Laterality: Right;   SHOULDER  ARTHROSCOPY WITH DISTAL CLAVICLE RESECTION Right 01/12/2017   Procedure: RIGHT SHOULDER ARTHROSCOPY WITH  SUBACROMIAL DECOMPRESSION, DISTAL CLAVICLE EXCISION, extensive debridement;  Surgeon: Leandrew Koyanagi, MD;  Location: [place];  Service: Orthopedics;  Laterality: Right;   THYMECTOMY  2004   THYROIDECTOMY     Social History   Occupational History   Not on file  Tobacco Use   Smoking status: Every Day    Packs/day: 0.50    Years: 34.00    Pack years: 17.00    Types: Cigarettes   Smokeless tobacco: Never  Vaping Use   Vaping Use: Never used  Substance and Sexual Activity   Alcohol use: Yes    Comment: occassional   Drug use: No   Sexual activity: Not on file

## 2021-09-08 ENCOUNTER — Telehealth: Payer: Self-pay | Admitting: Critical Care Medicine

## 2021-09-08 NOTE — Telephone Encounter (Signed)
Routing to covering provider.  °

## 2021-09-08 NOTE — Telephone Encounter (Signed)
Pharmacy called stating that they sent over a fax on 09/07/21 in regards to needing to switch manufacturers for the pts levothyroxine. He states that he is needing to have PCP approval before being able to refill medication. Please advise.      [place], [place] [address]  Phone: [phone] Fax: [phone]  Hours: Not open 24 hours

## 2021-09-09 ENCOUNTER — Other Ambulatory Visit: Payer: Self-pay

## 2021-09-09 ENCOUNTER — Ambulatory Visit: Payer: Self-pay | Attending: Family Medicine | Admitting: Clinical

## 2021-09-09 DIAGNOSIS — F330 Major depressive disorder, recurrent, mild: Secondary | ICD-10-CM

## 2021-09-10 NOTE — Telephone Encounter (Signed)
Okay to go ahead and fill prescription.

## 2021-09-10 NOTE — Telephone Encounter (Signed)
[facility] was called and informed of providers response and pt has been called and informed that medication will be ready for pick up.

## 2021-09-10 NOTE — Telephone Encounter (Signed)
[facility] called again to get the ok for the new manufactured RX for levothyroxine (EUTHYROX) 50 MCG tablet / Pt is completely out and is wanting to pick up today/please advise the pharmacy asap

## 2021-09-11 ENCOUNTER — Ambulatory Visit (INDEPENDENT_AMBULATORY_CARE_PROVIDER_SITE_OTHER): Payer: Self-pay | Admitting: Orthopedic Surgery

## 2021-09-11 ENCOUNTER — Encounter: Payer: Self-pay | Admitting: Orthopedic Surgery

## 2021-09-11 ENCOUNTER — Other Ambulatory Visit: Payer: Self-pay

## 2021-09-11 DIAGNOSIS — M25511 Pain in right shoulder: Secondary | ICD-10-CM

## 2021-09-11 DIAGNOSIS — G8929 Other chronic pain: Secondary | ICD-10-CM

## 2021-09-11 NOTE — Progress Notes (Signed)
Office Visit Note   Patient: Meagan Flores           Date of Birth: [birth year]           MRN: [id number] Visit Date: 09/11/2021 Requested by: Elsie Stain, MD [address],  [place] [address] PCP: Elsie Stain, MD  Subjective: Chief Complaint  Patient presents with   Right Shoulder - Pain    HPI: Meagan Flores is a 54 year old patient with right shoulder pain.  Patient had surgery on the right shoulder about 3 years ago and did reasonably well with that.  Had recurrent surgery this past February.  Op note and photos are reviewed.  Debridement subacromial decompression arthroscopic distal clavicle excision and biceps tenotomy was performed.  Patient has had continued pain since that time.  Underwent MRI scan in May of the shoulder which showed no definitive findings to explain her pain.  I reviewed the scan with the patient.  Biceps tendon is above the pec tendon attachment.  Rotator cuff appears intact.  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  Cervical spine injection did not give her any shoulder pain relief.  Has pain with AB duction.  She has had 2 quit work because of this shoulder dilemma.  Localizes deep pain to the anterior aspect of the shoulder.  Does not really report much in the way of superior symptoms.  She did have 18 visits of physical therapy and for injections and surgery.  The injections were glenohumeral joint injections which gave her 1 to 2 hours of relief.              ROS: All systems reviewed are negative as they relate to the chief complaint within the history of present illness.  Patient denies  fevers or chills.   Assessment & Plan: Visit Diagnoses:  1. Chronic right shoulder pain     Plan: Impression is right anterior shoulder pain without evidence of adhesive capsulitis.  External rotation symmetric between sides.  Rotator cuff strength is also good.  No  discrete tenderness in the [facility] joint right versus left but that would be a consideration as a possible pain generator as well.  MRI scan shows around 7 to 8 mm of resection.  I think it is possible that the biceps tendon could be the pain generator where it lies above the pec tendon.  She does not have a Popeye deformity but does have slight asymmetry of the muscle which is consistent with her MRI findings.  Plan at this time is ultrasound-guided diagnostic lidocaine injection into the tendon sheath just above the pec tendon.  We will see if that gives her any type of relief.  If so then we may want to consider subpectoral biceps tenodesis.  Follow-up next week for injection.  Follow-Up Instructions: No follow-ups on file.   Orders:  No orders of the defined types were placed in this encounter.  No orders of the defined types were placed in this encounter.     Procedures: No procedures performed   Clinical Data: No additional findings.  Objective: Vital Signs: There were no vitals taken for this visit.  Physical Exam:   Constitutional: Patient appears well-developed HEENT:  Head: Normocephalic Eyes:EOM are normal Neck: Normal range of motion Cardiovascular: Normal rate Pulmonary/chest: Effort normal Neurologic: Patient is alert Skin: Skin is warm Psychiatric: Patient has normal mood and  affect   Ortho Exam: Ortho exam demonstrates reasonable cervical spine range of motion.  5 out of 5 grip EPL FPL interosseous resection extension bicep triceps and deltoid strength.  Patient has symmetric external rotation both sides about 65 degrees.  Rotator cuff strength is intact infraspinatus extremity subscap muscle testing.  No paresthesias C5-T1.  Slightly more tenderness to [facility] joint palpation on the right compared to the left but no real pain with crossarm adduction.  She does have pain with AB duction.  No coarse grinding with passive range of motion on the right-hand side.  No  lymphadenopathy present.  Specialty Comments:  No specialty comments available.  Imaging: No results found.   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   Tendinopathy of right biceps tendon 09/30/2020   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   Aneurysm ([place]) 03/18/2020   History of TIA (transient ischemic attack) 02/14/2020   History of stroke    Mixed hyperlipidemia    Essential hypertension    Acute pain of left shoulder 09/08/2017   Impingement syndrome of right shoulder 12/24/2016   Tobacco use 09/28/2016   Glenohumeral arthritis 12/27/2014   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   Past Medical History:  Diagnosis Date   Acute pain of left shoulder 09/08/2017   Aneurysm ([place]) 03/18/2020   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   COVID-19 virus infection 02/14/2020   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) tear of right shoulder 09/30/2020   Essential hypertension    Glenohumeral arthritis 12/27/2014   History of stroke    History of TIA (transient ischemic attack) 02/14/2020   Hyperlipidemia    Hypothyroidism    Impingement syndrome of right shoulder 12/24/2016   Mixed hyperlipidemia    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   Prediabetes    Preop cardiovascular exam 11/25/2020   Pulmonary hypertension, unspecified ([place]) 11/25/2020   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   Rotator cuff impingement syndrome of right shoulder 09/30/2020   Skin rash 02/17/2021   Stroke ([place])    Tendinopathy of right biceps tendon 09/30/2020   Thyroid disease    TIA  (transient ischemic attack) 04/15/2018   Tobacco use 09/28/2016    Family History  Problem Relation Age of Onset   Stomach cancer Sister    Colon polyps Sister    Esophageal cancer Neg Hx    Rectal cancer Neg Hx    Colon cancer Neg Hx    Breast cancer Neg Hx     Past Surgical History:  Procedure Laterality Date   PARTIAL HYSTERECTOMY     right arm sx     SHOULDER ARTHROSCOPY WITH BICEPSTENOTOMY Right 01/21/2021   Procedure: SHOULDER ARTHROSCOPY WITH BICEPSTENOTOMY;  Surgeon: Leandrew Koyanagi, MD;  Location: [place];  Service: Orthopedics;  Laterality: Right;   SHOULDER ARTHROSCOPY WITH DISTAL CLAVICLE RESECTION Right 01/12/2017   Procedure: RIGHT SHOULDER ARTHROSCOPY WITH  SUBACROMIAL DECOMPRESSION, DISTAL CLAVICLE EXCISION, extensive debridement;  Surgeon: Leandrew Koyanagi, MD;  Location: [place];  Service: Orthopedics;  Laterality: Right;   THYMECTOMY  2004   THYROIDECTOMY     Social History   Occupational History  Not on file  Tobacco Use   Smoking status: Every Day    Packs/day: 0.50    Years: 34.00    Pack years: 17.00    Types: Cigarettes   Smokeless tobacco: Never  Vaping Use   Vaping Use: Never used  Substance and Sexual Activity   Alcohol use: Yes    Comment: occassional   Drug use: No   Sexual activity: Not on file

## 2021-09-14 ENCOUNTER — Telehealth: Payer: Self-pay | Admitting: Orthopedic Surgery

## 2021-09-14 ENCOUNTER — Telehealth: Payer: Self-pay | Admitting: Physical Medicine and Rehabilitation

## 2021-09-14 NOTE — Telephone Encounter (Signed)
Patient called. She would like an appointment with Dr. Ernestina Patches. Her call back number is [phone]

## 2021-09-14 NOTE — Telephone Encounter (Signed)
Pt called stated she received a voicemail about injection. No notes on pt file. Please call pt at  [phone].

## 2021-09-15 NOTE — Telephone Encounter (Signed)
Right C7-T1 IL on 06/11/2021. Ok to repeat if helped, same problem/side, and no new injury?

## 2021-09-15 NOTE — Telephone Encounter (Signed)
I did not

## 2021-09-16 NOTE — Telephone Encounter (Signed)
Per Deans last note on 10/14  "Plan at this time is ultrasound-guided diagnostic lidocaine injection into the tendon sheath just above the pec tendon. Follow up next week"  Did you want her to be added to you schedule ?  I don't see referral to [facility].

## 2021-09-16 NOTE — Telephone Encounter (Signed)
Left message #1 to discuss.

## 2021-09-17 NOTE — BH Specialist Note (Signed)
Integrated Behavioral Health Follow Up In-Person Visit  MRN: [id number] Name: Meagan Flores  Number of [facility] Clinician visits: 3/6 Session Start time: 8:40am  Session End time: 9:10am Total time: 30 minutes  Types of Service: Individual psychotherapy  Interpretor:No. Interpretor Name and Language: N/A  Subjective: Meagan Flores is a 55 y.o. female accompanied by  self Patient was referred by PCP Asencion Noble, MD for depression and anxiety. Patient reports the following symptoms/concerns: Pt reports feeling depressed, decreased energy, self-esteem disturbances, anxiousness, and trouble relaxing. Reports that she continues to experience pain in her shoulder. Reports feeling bad about herself due to being unable to work due to physical health. Reports that she is also continuing to grieve the loss of her parents. Reports that she frequently wonders in the health care professionals that were caring for her parents prior to their death were properly treating them.  Duration of problem: 2 years; Severity of problem: mild  Objective: Mood: Depressed and Affect: Appropriate Risk of harm to self or others: No plan to harm self or others  Life Context: Family and Social: Reports her spouse and children as support system.  School/Work: Reports that she is unemployed due to having a shoulder injury. Reports that her spouse provides financially. Reports that she has applied for disability and has an attorney assisting her. Self-Care: Reports that she continues to isolate herself from others.  Life Changes: Reports that her parents passed away about two years ago and she has been trying to adjust. Reports that she has also had a shoulder injury that her caused her to be unable to work.   Patient and/or Family's Strengths/Protective Factors: Concrete supports in place (healthy food, safe environments, etc.)  Goals Addressed: Patient will:  Reduce symptoms of: anxiety and  depression   Increase knowledge and/or ability of: coping skills   Demonstrate ability to: Increase healthy adjustment to current life circumstances and Begin healthy grieving over loss  Progress towards Goals: Ongoing  Interventions: Interventions utilized:  Mindfulness or Psychologist, educational, CBT Cognitive Behavioral Therapy, and Supportive Counseling Standardized Assessments completed: PHQ 9  Patient and/or Family Response: Pt receptive to tx. Pt receptive to psychoeducation provided on stages of grief. Pt receptive to cognitive restructuring utilized to decrease pt's negative thoughts and assist with cognitive processing. Pt receptive to continuing deep breathing exercises.   Patient Centered Plan: Patient is on the following Treatment Plan(s): Depression, Anxiety, and Grief  Assessment: Denies SI/HI. Denies auditory/visual hallucinations. No safety risks. Patient currently experiencing depression and anxiety related to grief and difficulty adjusting to health problems. Pt is experiencing difficulty with accepting the loss of her parents. Pt appears to have frustrations with the health care professionals who cared for her parents while they were hospitalized. Pt also continues to have difficulty with her physical health and experiencing pain.    Patient may benefit from ongoing brief therapy. LCSWA provided psychoeducation on grief and attempted to normalize pt's difficulty with acceptance. LCSWA utilized Adult nurse. LCSWA encouraged pt to continue deep breathing exercises and progressive muscle relaxation.  Plan: Follow up with behavioral health clinician on : 09/30/21 Behavioral recommendations: Utilize deep breathing exercises and meditation Referral(s): [facility] (In Clinic) "From scale of 1-10, how likely are you to follow plan?": 10  Tamarick Kovalcik C Dawsyn Zurn, LCSW

## 2021-09-22 IMAGING — CR DG SHOULDER 2+V*L*
3 series · 3 of 3 positions shown · non-contrast
Comparison: None.

CLINICAL DATA: Acute on chronic left shoulder pain

EXAM:
LEFT SHOULDER - 2+ VIEW

[w shoulder ap internal left]
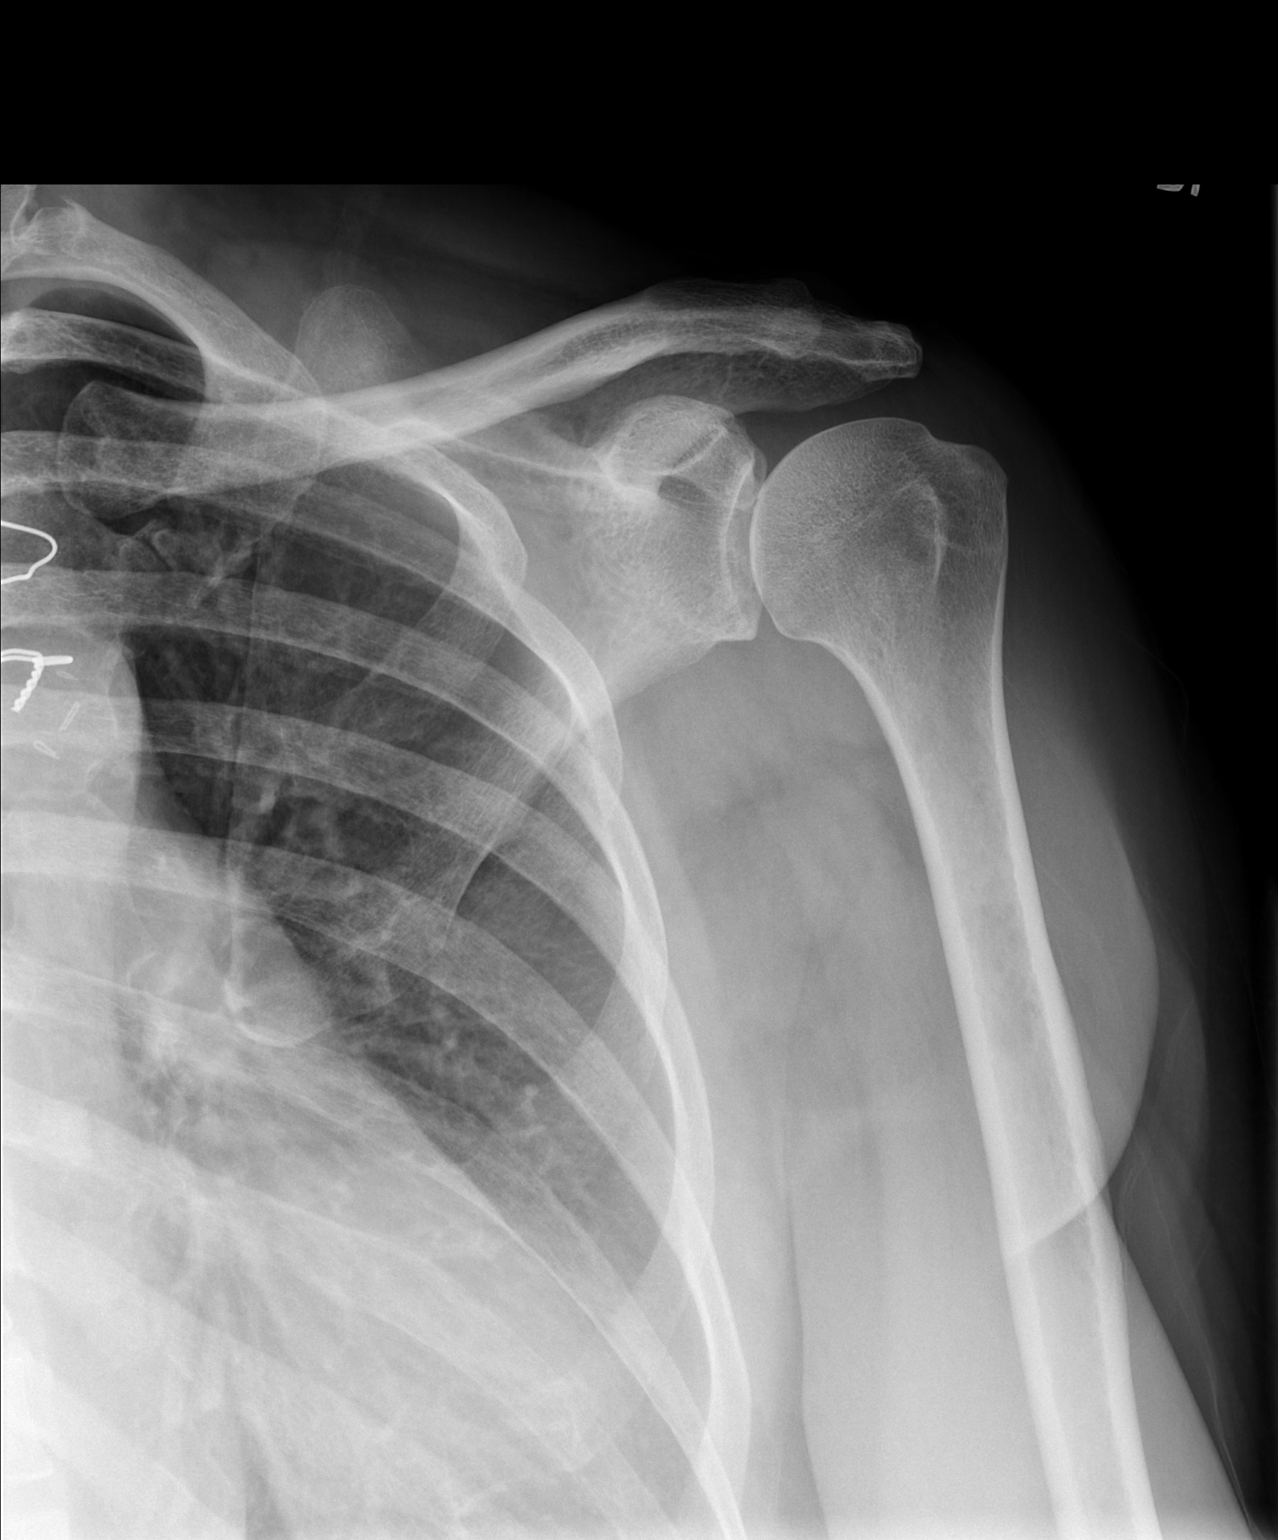

[w shoulder y view left]
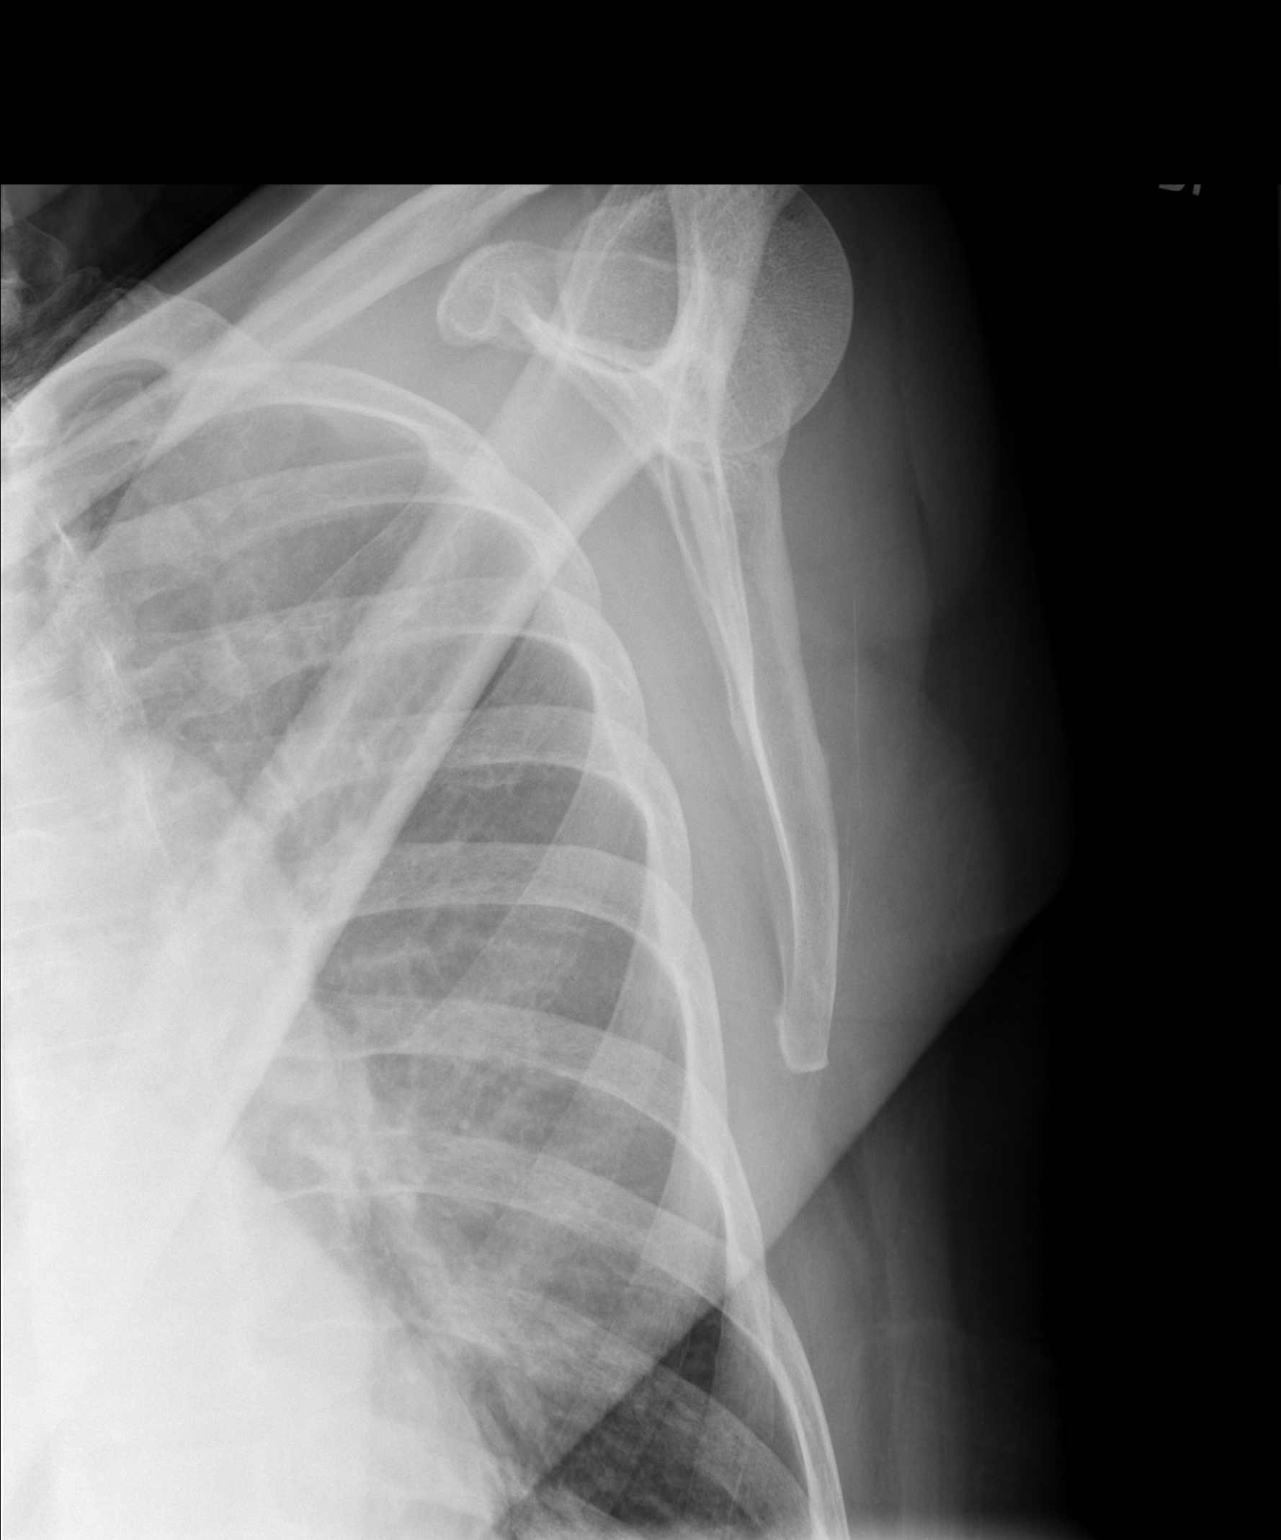

[w shoulder axillary left *]
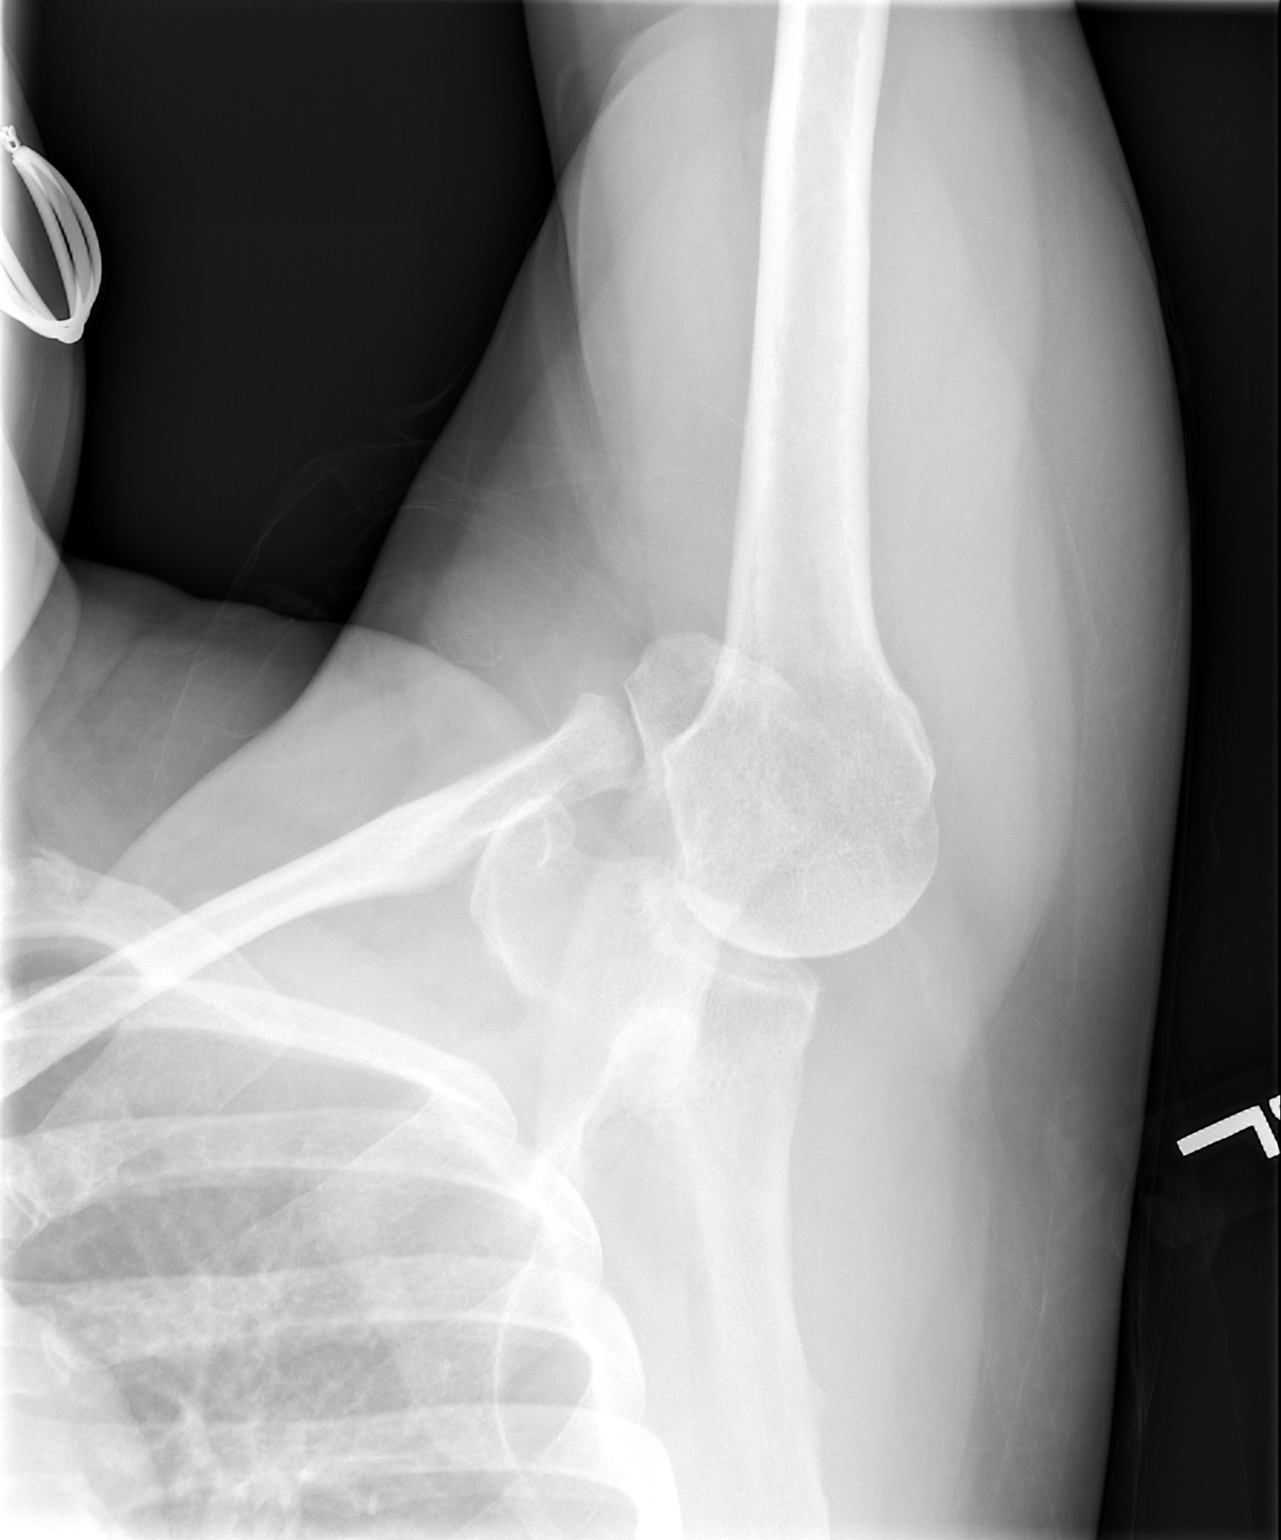

[3 of 3 positions shown; findings below may reference images not displayed]

FINDINGS: There is no evidence of fracture or dislocation. There is no
evidence of arthropathy or other focal bone abnormality. Soft
tissues are unremarkable.
IMPRESSION: Negative.

## 2021-09-23 ENCOUNTER — Other Ambulatory Visit: Payer: Self-pay

## 2021-09-23 ENCOUNTER — Ambulatory Visit (INDEPENDENT_AMBULATORY_CARE_PROVIDER_SITE_OTHER): Payer: Self-pay | Admitting: Orthopedic Surgery

## 2021-09-23 DIAGNOSIS — M25511 Pain in right shoulder: Secondary | ICD-10-CM

## 2021-09-23 DIAGNOSIS — G8929 Other chronic pain: Secondary | ICD-10-CM

## 2021-09-26 ENCOUNTER — Encounter: Payer: Self-pay | Admitting: Orthopedic Surgery

## 2021-09-26 NOTE — Progress Notes (Signed)
Office Visit Note   Patient: Meagan Flores           Date of Birth: [birth year]           MRN: [id number] Visit Date: 09/23/2021 Requested by: Elsie Stain, MD [address],  [place] [address] PCP: Elsie Stain, MD  Subjective: Chief Complaint  Patient presents with   Right Shoulder - Pain    HPI: Meagan Flores is a 55 year old patient with right shoulder pain.  Has had 2 prior surgeries.  Localizes pain to the anterior aspect of the shoulder.  Presents now for diagnostic injection under ultrasound guidance around the biceps tendon at the proximal aspect of the pec tendon attachment.              ROS: All systems reviewed are negative as they relate to the chief complaint within the history of present illness.  Patient denies  fevers or chills.   Assessment & Plan: Visit Diagnoses:  1. Chronic right shoulder pain     Plan: Impression is marginal to no relief after injecting 10 cc of Marcaine around the biceps tendon at the proximal aspect of the pec tendon attachment.  Although Alethia did not really get any pain relief from that injection it was noted during the injection that the biceps tendon in was free-floating but not able to migrate below the pec tendon.  Despite no significant pain relief from that injection I think it is still possible that the biceps tendon could be giving her trouble.  Whether or not that level of certainty warrants yet another operation for Meagan Flores is really a decision she will have to make.  Follow-Up Instructions: Return if symptoms worsen or fail to improve.   Orders:  No orders of the defined types were placed in this encounter.  No orders of the defined types were placed in this encounter.     Procedures: No procedures performed   Clinical Data: No additional findings.  Objective: Vital Signs: There were no vitals taken for this visit.  Physical Exam:   Constitutional: Patient appears well-developed HEENT:  Head:  Normocephalic Eyes:EOM are normal Neck: Normal range of motion Cardiovascular: Normal rate Pulmonary/chest: Effort normal Neurologic: Patient is alert Skin: Skin is warm Psychiatric: Patient has normal mood and affect   Ortho Exam: Ortho exam demonstrates maintained passive range of motion of the shoulder with good rotator cuff strength on the right.  Localizes the pain primarily anteriorly.  No Popeye deformity.  Not too much asymmetric pain with resisted supination.  Under ultrasound guidance in the transverse and longitudinal plane a 22-gauge needle with 10 cc of bupivacaine injected underneath the biceps tendon which with extravasation of fluid was noted to be freely mobile.  However it would not migrate below the pec attachment with arm extension.  This indicates that the biceps tendon has not yet auto tenodesed in its current location.   I talked with Meagan Flores on the phone this morning.  She really did not get any relief from that injection into the and below the biceps tendon.  I think that diminishes her chances of having a successful result from surgical intervention from doing a subpectoral biceps tenodesis.  Nonetheless that is really the only anterior shoulder pain generator that I can find.  Her strength is reasonable and there is no rotator cuff tear in the shoulder at least based on MRI scanning from 2 to 3 months ago.  Discussed with her how she did not  get any relief from cervical spine injection.  Most of that foraminal pathology was on the left-hand side.  She is in a difficult situation whereby operative intervention in the shoulder is at best a 50-50 proposition for improvement.  She is going to consider her options and call either me or Dr. Erlinda Hong on Monday. Specialty Comments:  No specialty comments available.  Imaging: No results found.   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   Aneurysm ([facility]) 03/18/2020   History of TIA (transient ischemic attack) 02/14/2020   History of stroke    Mixed hyperlipidemia    Essential hypertension    Acute pain of left shoulder 09/08/2017   Impingement syndrome of right shoulder 12/24/2016   Tobacco use 09/28/2016   Glenohumeral arthritis 12/27/2014   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   Past Medical History:  Diagnosis Date   Acute pain of left shoulder 09/08/2017   Aneurysm ([facility]) 03/18/2020   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   COVID-19 virus infection 02/14/2020   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) tear of right shoulder 09/30/2020   Essential hypertension    Glenohumeral arthritis 12/27/2014   History of stroke    History of TIA (transient ischemic attack) 02/14/2020   Hyperlipidemia    Hypothyroidism    Impingement syndrome of right shoulder 12/24/2016   Mixed hyperlipidemia    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   Prediabetes    Preop cardiovascular exam 11/25/2020   Pulmonary hypertension, unspecified ([facility]) 11/25/2020   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   Rotator cuff impingement syndrome of right shoulder 09/30/2020   Skin rash 02/17/2021   Stroke ([facility])    Tendinopathy of right biceps tendon 09/30/2020   Thyroid disease    TIA (transient ischemic attack) 04/15/2018   Tobacco use 09/28/2016    Family History  Problem Relation Age of Onset   Stomach cancer Sister    Colon polyps Sister    Esophageal cancer Neg Hx    Rectal cancer Neg Hx    Colon cancer Neg Hx    Breast  cancer Neg Hx     Past Surgical History:  Procedure Laterality Date   PARTIAL HYSTERECTOMY     right arm sx     SHOULDER ARTHROSCOPY WITH BICEPSTENOTOMY Right 01/21/2021   Procedure: SHOULDER ARTHROSCOPY WITH BICEPSTENOTOMY;  Surgeon: Leandrew Koyanagi, MD;  Location: [facility];  Service: Orthopedics;  Laterality: Right;   SHOULDER ARTHROSCOPY WITH DISTAL CLAVICLE RESECTION Right 01/12/2017   Procedure: RIGHT SHOULDER ARTHROSCOPY WITH  SUBACROMIAL DECOMPRESSION, DISTAL CLAVICLE EXCISION, extensive debridement;  Surgeon: Leandrew Koyanagi, MD;  Location: [facility];  Service: Orthopedics;  Laterality: Right;   THYMECTOMY  2004   THYROIDECTOMY     Social History   Occupational History   Not on file  Tobacco Use   Smoking status: Every Day    Packs/day: 0.50    Years: 34.00    Pack years: 17.00    Types: Cigarettes   Smokeless tobacco: Never  Vaping Use   Vaping Use: Never used  Substance and Sexual Activity   Alcohol use: Yes    Comment: occassional   Drug use: No   Sexual activity: Not on file

## 2021-09-29 ENCOUNTER — Other Ambulatory Visit: Payer: Self-pay

## 2021-09-29 ENCOUNTER — Encounter: Payer: Self-pay | Admitting: Critical Care Medicine

## 2021-09-29 ENCOUNTER — Ambulatory Visit: Payer: Self-pay | Attending: Critical Care Medicine | Admitting: Critical Care Medicine

## 2021-09-29 VITALS — BP 133/86 | HR 65 | Resp 16 | Wt 214.2 lb

## 2021-09-29 DIAGNOSIS — F17210 Nicotine dependence, cigarettes, uncomplicated: Secondary | ICD-10-CM

## 2021-09-29 DIAGNOSIS — Z720 Tobacco use: Secondary | ICD-10-CM

## 2021-09-29 DIAGNOSIS — S43431A Superior glenoid labrum lesion of right shoulder, initial encounter: Secondary | ICD-10-CM

## 2021-09-29 DIAGNOSIS — I10 Essential (primary) hypertension: Secondary | ICD-10-CM

## 2021-09-29 DIAGNOSIS — R21 Rash and other nonspecific skin eruption: Secondary | ICD-10-CM

## 2021-09-29 DIAGNOSIS — Z8673 Personal history of transient ischemic attack (TIA), and cerebral infarction without residual deficits: Secondary | ICD-10-CM

## 2021-09-29 DIAGNOSIS — E890 Postprocedural hypothyroidism: Secondary | ICD-10-CM

## 2021-09-29 MED ORDER — CLOBETASOL PROPIONATE 0.05 % EX CREA
1.0000 "application " | TOPICAL_CREAM | Freq: Two times a day (BID) | CUTANEOUS | 3 refills | Status: DC
  Filled 2021-09-29: qty 30, 15d supply, fill #0

## 2021-09-29 MED ORDER — VALSARTAN 160 MG PO TABS
160.0000 mg | ORAL_TABLET | Freq: Every day | ORAL | 4 refills | Status: DC
  Filled 2021-09-29: qty 30, 30d supply, fill #0

## 2021-09-29 MED ORDER — ATORVASTATIN CALCIUM 40 MG PO TABS
40.0000 mg | ORAL_TABLET | Freq: Every day | ORAL | 1 refills | Status: DC
  Filled 2021-09-29: qty 30, 30d supply, fill #0

## 2021-09-29 MED ORDER — METOPROLOL SUCCINATE ER 50 MG PO TB24
50.0000 mg | ORAL_TABLET | Freq: Every day | ORAL | 1 refills | Status: DC
  Filled 2021-09-29: qty 30, 30d supply, fill #0

## 2021-09-29 MED ORDER — LEVOTHYROXINE SODIUM 50 MCG PO TABS
100.0000 ug | ORAL_TABLET | Freq: Every day | ORAL | 4 refills | Status: DC
  Filled 2021-09-29: qty 60, 30d supply, fill #0

## 2021-09-29 MED ORDER — PREGABALIN 150 MG PO CAPS
150.0000 mg | ORAL_CAPSULE | Freq: Two times a day (BID) | ORAL | 4 refills | Status: DC
  Filled 2021-09-29: qty 60, 30d supply, fill #0

## 2021-09-29 MED ORDER — ASPIRIN EC 81 MG PO TBEC
81.0000 mg | DELAYED_RELEASE_TABLET | Freq: Every day | ORAL | 3 refills | Status: DC
  Filled 2021-09-29: qty 60, 60d supply, fill #0

## 2021-09-29 NOTE — Assessment & Plan Note (Signed)
Patient has resumed cigarette use and one pack will last her 3 days. Attributes to increased stress and pain with right shoulder tendenopathy. Recommended nicotine replacement therapy     . Current smoking consumption amount: 1 pk every three days  . Dicsussion on advise to quit smoking and smoking impacts: cv impacts  . Patient's willingness to quit:  Wants to quit  . Methods to quit smoking discussed:  Nicotine replacment  . Medication management of smoking session drugs discussed: nicotine patch  . Resources provided:  AVS   . Setting quit date  Not established  . Follow-up arranged  Dr Joya Gaskins 4 months,   Time spent counseling the patient:  5 min

## 2021-09-29 NOTE — Assessment & Plan Note (Signed)
Blood pressure well controlled at this time  Continue metoprolol and valsartan

## 2021-09-29 NOTE — Assessment & Plan Note (Signed)
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

## 2021-09-29 NOTE — Assessment & Plan Note (Signed)
Hypothyroidism stable at this time continue Synthroid at current dose

## 2021-09-29 NOTE — Assessment & Plan Note (Signed)
Skin rash persist patient was instructed to use moisturizing cream along with the steroid cream and refill given

## 2021-09-29 NOTE — Patient Instructions (Signed)
Pick up orange card [place] discount blue card application on the way out  All of your medications were sent to our pharmacy and we increase the dose of Lyrica to 150 mg twice daily because of your shoulder pain  No change in other medications  We discussed a healthy diet see attachment  Return to see Dr. Joya Gaskins 4 months  Work on reducing your tobacco intake as we discussed see attachment

## 2021-09-29 NOTE — Progress Notes (Signed)
Established Patient Office Visit  Subjective:  Patient ID: Meagan Flores, female    DOB: 1966/07/16  Age: 55 y.o. MRN: [id number]  CC:  Chief Complaint  Patient presents with   Hypertension     HPI Meagan Flores presents for primary care follow-up visit.  She continues to struggle with pain in the right shoulder and distal lesser extremity the left shoulder.  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  She states the Lyrica helps minimally.  She has had an injections again on the right shoulder but has not seen significant relief.  She is still smoking 6 to 7 cigarettes a day.  Her weight is still 214 pounds.  She is taking the valsartan 10 daily and metoprolol and on arrival blood pressure is good at 133/86.  Patient's dietary compliance is plus minus.  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) tear of right shoulder 09/30/2020   Essential hypertension    Glenohumeral arthritis 12/27/2014   History of stroke    History of TIA (transient ischemic attack) 02/14/2020   Hyperlipidemia    Hypothyroidism    Impingement syndrome of right shoulder 12/24/2016   Mixed hyperlipidemia    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   Prediabetes    Preop cardiovascular exam 11/25/2020   Pulmonary hypertension, unspecified ([place]) 11/25/2020   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   Rotator cuff impingement syndrome of right shoulder 09/30/2020   Skin rash 02/17/2021   Stroke [facility])    Tendinopathy of right biceps tendon 09/30/2020   Thyroid disease    TIA (transient ischemic attack) 04/15/2018   Tobacco use 09/28/2016    Past Surgical History:  Procedure Laterality Date   PARTIAL HYSTERECTOMY     right arm sx     SHOULDER ARTHROSCOPY WITH BICEPSTENOTOMY Right 01/21/2021   Procedure:  SHOULDER ARTHROSCOPY WITH BICEPSTENOTOMY;  Surgeon: Leandrew Koyanagi, MD;  Location: [facility];  Service: Orthopedics;  Laterality: Right;   SHOULDER ARTHROSCOPY WITH DISTAL CLAVICLE RESECTION Right 01/12/2017   Procedure: RIGHT SHOULDER ARTHROSCOPY WITH  SUBACROMIAL DECOMPRESSION, DISTAL CLAVICLE EXCISION, extensive debridement;  Surgeon: Leandrew Koyanagi, MD;  Location: [facility];  Service: Orthopedics;  Laterality: Right;   THYMECTOMY  2004   THYROIDECTOMY      Family History  Problem Relation Age of Onset   Stomach cancer Sister    Colon polyps Sister    Esophageal cancer Neg Hx    Rectal cancer Neg Hx    Colon cancer Neg Hx    Breast cancer Neg Hx     Social History   Socioeconomic History   Marital status: Married    Spouse name: Not on file   Number of children: Not on file   Years of education: Not on file   Highest education level: Not on file  Occupational History   Not on file  Tobacco Use   Smoking status: Every Day    Packs/day: 0.50    Years: 34.00    Pack years: 17.00    Types: Cigarettes   Smokeless tobacco: Never  Vaping Use   Vaping Use: Never used  Substance and Sexual Activity   Alcohol use: Yes    Comment: occassional   Drug use: No   Sexual activity: Not on file  Other  Topics Concern   Not on file  Social History Narrative   Not on file   Social Determinants of Health   Financial Resource Strain: Not on file  Food Insecurity: Not on file  Transportation Needs: Not on file  Physical Activity: Not on file  Stress: Not on file  Social Connections: Not on file  Intimate Partner Violence: Not on file    Outpatient Medications Prior to Visit  Medication Sig Dispense Refill   acetaminophen-codeine (TYLENOL #3) 300-30 MG tablet Take 1 tablet by mouth every 8 (eight) hours as needed for pain.     aspirin EC 81 MG tablet Take 1 tablet (81 mg total) by mouth daily. 60 tablet 3   atorvastatin (LIPITOR) 40 MG tablet Take 1  tablet (40 mg total) by mouth daily at 6 PM. 90 tablet 1   clobetasol cream (TEMOVATE) 6.07 % Apply 1 application topically 2 (two) times daily. 30 g 3   levothyroxine (EUTHYROX) 50 MCG tablet Take 2 tablets (100 mcg total) by mouth daily before breakfast. 60 tablet 4   metoprolol succinate (TOPROL-XL) 50 MG 24 hr tablet Take 1 tablet (50 mg total) by mouth daily. Take with or immediately following a meal. 90 tablet 1   nabumetone (RELAFEN) 750 MG tablet Take 1 tablet (750 mg total) by mouth 2 (two) times daily as needed. (Patient taking differently: Take 750 mg by mouth 2 (two) times daily as needed for moderate pain.) 60 tablet 1   ondansetron (ZOFRAN) 4 MG tablet Take 1 tablet (4 mg total) by mouth every 8 (eight) hours as needed for nausea or vomiting. 20 tablet 0   pregabalin (LYRICA) 75 MG capsule Take 1 capsule (75 mg total) by mouth 2 (two) times daily. 60 capsule 1   valsartan (DIOVAN) 160 MG tablet Take 160 mg by mouth daily.     No facility-administered medications prior to visit.    Allergies  Allergen Reactions   Sulfa Antibiotics Hives    ROS Review of Systems  Constitutional: Negative.   HENT: Negative.  Negative for ear pain, postnasal drip, rhinorrhea, sinus pressure, sore throat, trouble swallowing and voice change.   Eyes: Negative.   Respiratory: Negative.  Negative for apnea, cough, choking, chest tightness, shortness of breath, wheezing and stridor.   Cardiovascular: Negative.  Negative for chest pain, palpitations and leg swelling.  Gastrointestinal: Negative.  Negative for abdominal distention, abdominal pain, nausea and vomiting.  Genitourinary: Negative.   Musculoskeletal:  Negative for arthralgias and myalgias.       Shoulder pain  Skin: Negative.  Negative for rash.  Allergic/Immunologic: Negative.  Negative for environmental allergies and food allergies.  Neurological: Negative.  Negative for dizziness, syncope, weakness and headaches.  Hematological:  Negative.  Negative for adenopathy. Does not bruise/bleed easily.  Psychiatric/Behavioral: Negative.  Negative for agitation and sleep disturbance. The patient is not nervous/anxious.      Objective:    Physical Exam Vitals reviewed.  Constitutional:      Appearance: Normal appearance. She is well-developed. She is obese. She is not diaphoretic.  HENT:     Head: Normocephalic and atraumatic.     Nose: No nasal deformity, septal deviation, mucosal edema or rhinorrhea.     Right Sinus: No maxillary sinus tenderness or frontal sinus tenderness.     Left Sinus: No maxillary sinus tenderness or frontal sinus tenderness.     Mouth/Throat:     Pharynx: No oropharyngeal exudate.  Eyes:     General: No scleral  icterus.    Conjunctiva/sclera: Conjunctivae normal.     Pupils: Pupils are equal, round, and reactive to light.  Neck:     Thyroid: No thyromegaly.     Vascular: No carotid bruit or JVD.     Trachea: Trachea normal. No tracheal tenderness or tracheal deviation.  Cardiovascular:     Rate and Rhythm: Normal rate and regular rhythm.     Chest Wall: PMI is not displaced.     Pulses: Normal pulses. No decreased pulses.     Heart sounds: Normal heart sounds, S1 normal and S2 normal. Heart sounds not distant. No murmur heard. No systolic murmur is present.  No diastolic murmur is present.    No friction rub. No gallop. No S3 or S4 sounds.  Pulmonary:     Effort: No tachypnea, accessory muscle usage or respiratory distress.     Breath sounds: No stridor. No decreased breath sounds, wheezing, rhonchi or rales.  Chest:     Chest wall: No tenderness.  Abdominal:     General: Bowel sounds are normal. There is no distension.     Palpations: Abdomen is soft. Abdomen is not rigid.     Tenderness: There is no abdominal tenderness. There is no guarding or rebound.  Musculoskeletal:     Cervical back: Normal range of motion and neck supple. No edema, erythema or rigidity. No muscular  tenderness. Normal range of motion.     Comments: Decreased range of motion right shoulder with tenderness anteriorly  Lymphadenopathy:     Head:     Right side of head: No submental or submandibular adenopathy.     Left side of head: No submental or submandibular adenopathy.     Cervical: No cervical adenopathy.  Skin:    General: Skin is warm and dry.     Coloration: Skin is not pale.     Findings: No rash.     Nails: There is no clubbing.  Neurological:     Mental Status: She is alert and oriented to person, place, and time.     Sensory: No sensory deficit.  Psychiatric:        Mood and Affect: Mood normal.        Speech: Speech normal.        Behavior: Behavior normal.        Thought Content: Thought content normal.        Judgment: Judgment normal.    BP 133/86   Pulse 65   Resp 16   Wt 214 lb 3.2 oz (97.2 kg)   SpO2 93%   BMI 32.57 kg/m  Wt Readings from Last 3 Encounters:  09/29/21 214 lb 3.2 oz (97.2 kg)  08/31/21 215 lb (97.5 kg)  07/03/21 213 lb (96.6 kg)     There are no preventive care reminders to display for this patient.   There are no preventive care reminders to display for this patient.  Lab Results  Component Value Date   TSH 6.990 (H) 06/29/2021   Lab Results  Component Value Date   WBC 7.3 11/04/2020   HGB 11.7 (L) 11/04/2020   HCT 36.3 11/04/2020   MCV 74.8 (L) 11/04/2020   PLT 233 11/04/2020   Lab Results  Component Value Date   NA 140 11/04/2020   K 3.5 11/04/2020   CO2 26 11/04/2020   GLUCOSE 113 (H) 11/04/2020   BUN 15 11/04/2020   CREATININE 0.78 11/04/2020   BILITOT 0.4 11/04/2020   ALKPHOS 69 11/04/2020  AST 16 11/04/2020   ALT 13 11/04/2020   PROT 7.4 11/04/2020   ALBUMIN 3.8 11/04/2020   CALCIUM 8.1 (L) 11/04/2020   ANIONGAP 11 11/04/2020   Lab Results  Component Value Date   CHOL 203 (H) 04/15/2018   Lab Results  Component Value Date   HDL 45 04/15/2018   Lab Results  Component Value Date   LDLCALC 130  (H) 04/15/2018   Lab Results  Component Value Date   TRIG 140 04/15/2018   Lab Results  Component Value Date   CHOLHDL 4.5 04/15/2018   Lab Results  Component Value Date   HGBA1C 5.8 (A) 10/21/2020      Assessment & Plan:   Problem List Items Addressed This Visit       Cardiovascular and Mediastinum   Essential hypertension    Blood pressure well controlled at this time  Continue metoprolol and valsartan      Relevant Medications   aspirin EC 81 MG tablet   atorvastatin (LIPITOR) 40 MG tablet   metoprolol succinate (TOPROL-XL) 50 MG 24 hr tablet   valsartan (DIOVAN) 160 MG tablet     Endocrine   Postoperative hypothyroidism    Hypothyroidism stable at this time continue Synthroid at current dose      Relevant Medications   levothyroxine (EUTHYROX) 50 MCG tablet   metoprolol succinate (TOPROL-XL) 50 MG 24 hr tablet     Musculoskeletal and Integument   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) tear of right shoulder    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 Attributes to increased stress and pain with right shoulder tendenopathy. Recommended nicotine replacement therapy     Current smoking consumption amount: 1 pk every three days  Dicsussion on advise to quit smoking and smoking impacts: cv impacts  Patient's willingness to quit:  Wants to quit  Methods to quit smoking discussed:  Nicotine replacment  Medication management of smoking session drugs discussed: nicotine patch  Resources provided:  AVS   Setting quit date  Not established  Follow-up arranged  Dr Joya Gaskins 4 months,   Time spent counseling the  patient:  5 min        History of TIA (transient ischemic attack)   Relevant Medications   atorvastatin (LIPITOR) 40 MG tablet    Meds ordered this encounter  Medications   aspirin EC 81 MG tablet    Sig: Take 1 tablet (81 mg total) by mouth daily.    Dispense:  60 tablet    Refill:  3   atorvastatin (LIPITOR) 40 MG tablet    Sig: Take 1 tablet (40 mg total) by mouth daily at 6 PM.    Dispense:  90 tablet    Refill:  1   clobetasol cream (TEMOVATE) 0.05 %    Sig: Apply 1 application topically 2 (two) times daily.    Dispense:  30 g    Refill:  3   levothyroxine (EUTHYROX) 50 MCG tablet    Sig: Take 2 tablets (100 mcg total) by mouth daily before breakfast.    Dispense:  60  tablet    Refill:  4   metoprolol succinate (TOPROL-XL) 50 MG 24 hr tablet    Sig: Take 1 tablet (50 mg total) by mouth daily. Take with or immediately following a meal.    Dispense:  90 tablet    Refill:  1   pregabalin (LYRICA) 150 MG capsule    Sig: Take 1 capsule (150 mg total) by mouth 2 (two) times daily.    Dispense:  60 capsule    Refill:  4   valsartan (DIOVAN) 160 MG tablet    Sig: Take 1 tablet (160 mg total) by mouth daily.    Dispense:  60 tablet    Refill:  4     Follow-up: Return in about 4 months (around 01/27/2022).    Asencion Noble, MD

## 2021-09-30 ENCOUNTER — Ambulatory Visit: Payer: Self-pay | Attending: Critical Care Medicine | Admitting: Clinical

## 2021-09-30 ENCOUNTER — Other Ambulatory Visit: Payer: Self-pay

## 2021-09-30 DIAGNOSIS — F330 Major depressive disorder, recurrent, mild: Secondary | ICD-10-CM

## 2021-10-02 NOTE — BH Specialist Note (Signed)
Integrated Behavioral Health Follow Up In-Person Visit  MRN: [id number] Name: Meagan Flores  Number of [place] Clinician visits: 4/6 Session Start time: 8:40am  Session End time: 9:05am Total time:  25  minutes  Types of Service: Individual psychotherapy  Interpretor:No. Interpretor Name and Language: N/A  Subjective: Meagan Flores is a 55 y.o. female accompanied by  self Patient was referred by PCP Asencion Noble, MD for depression and anxiety. Patient reports the following symptoms/concerns: Reports feeling depressed, decreased energy, anxiousness, worrying, and irritability. Reports that this month the month that she lost her father. Reports that she also continues to experience pain in her shoulders.  Duration of problem: 2 years; Severity of problem: mild  Objective: Mood: Depressed and Affect: Appropriate Risk of harm to self or others: No plan to harm self or others  Life Context: Family and Social: Reports her spouse and children as support system.  School/Work: Reports that she is unemployed due to having a shoulder injury. Reports that her spouse provides financially. Reports that she has applied for disability and has an attorney assisting her. Self-Care: Pt uses tobacco as coping skill. Life Changes: Reports that her parents passed away about two years ago and she has been trying to adjust. Reports that she has also had a shoulder injury that her caused her to be unable to work.     Patient and/or Family's Strengths/Protective Factors: Concrete supports in place (healthy food, safe environments, etc.)  Goals Addressed: Patient will:  Reduce symptoms of: anxiety and insomnia   Increase knowledge and/or ability of: coping skills   Demonstrate ability to: Increase healthy adjustment to current life circumstances and Begin healthy grieving over loss  Progress towards Goals: Ongoing  Interventions: Interventions utilized:  Mindfulness or Surveyor, mining, CBT Cognitive Behavioral Therapy, Supportive Counseling, and Psychoeducation and/or Health Education Standardized Assessments completed: GAD-7 and PHQ 9 GAD 7 : Generalized Anxiety Score 09/30/2021 09/29/2021 07/31/2021 06/29/2021  Nervous, Anxious, on Edge 1 1 1 1   Control/stop worrying 1 1 0 0  Worry too much - different things 1 1 1 1   Trouble relaxing 0 0 0 0  Restless 0 0 0 0  Easily annoyed or irritable 0 - 0 1  Afraid - awful might happen 1 1 1 1   Total GAD 7 Score 4 - 3 4  Anxiety Difficulty - - - -     Depression screen [facility] 2/9 09/30/2021 09/29/2021 09/09/2021 07/29/2021 06/29/2021  Decreased Interest 0 0 0 0 0  Down, Depressed, Hopeless 1 0 1 (No Data) 0  PHQ - 2 Score 1 0 1 0 0  Altered sleeping 0 0 0 0 0  Tired, decreased energy 1 0 1 1 0  Change in appetite 0 0 0 0 0  Feeling bad or failure about yourself  0 0 1 0 0  Trouble concentrating 0 0 0 0 0  Moving slowly or fidgety/restless 0 0 0 0 0  Suicidal thoughts 0 0 0 0 0  PHQ-9 Score 2 0 3 1 0  Difficult doing work/chores - - - - -  Some recent data might be hidden    Patient and/or Family Response: Pt receptive psychoeducation provided on the stages of grief. Pt receptive to support provided as she discussed her relationship with her father. Pt receptive to cognitive restructuring to decrease negative thoughts. Pt receptive to deep breathing exercises and progressive muscle relaxation.  Patient Centered Plan: Patient is on the following Treatment Plan(s): Depression, Anxiety,  and Grief  Assessment: Denies SI/HI. Denies auditory/visual hallucinations. No safety risks. Patient currently experiencing depression and anxiety related to grief and physical pain. Pt is continuing to process the loss of her parents as this is the month that her father passed away.  Patient may benefit from ongoing brief therapy. LCSWA provided psychoeducation on grief. LCSWA provided support and utilized cognitive restructuring LCSWA  encouraged pt to utilize deep breathing exercises and progressive muscle relaxation.  Plan: Follow up with behavioral health clinician on : 10/19/21 Behavioral recommendations: Utilize deep breathing exercises and progressive muscle relaxation. Referral(s): [place] (In Clinic) "From scale of 1-10, how likely are you to follow plan?": 10  Keiarah Orlowski C Tajai Suder, LCSW

## 2021-10-06 ENCOUNTER — Telehealth: Payer: Self-pay | Admitting: Orthopaedic Surgery

## 2021-10-06 NOTE — Telephone Encounter (Signed)
Can you advise? 

## 2021-10-06 NOTE — Telephone Encounter (Signed)
Pt called wondering if Dr.Dean and Dr.Blackman have spoken about what they are going to do about pt right shoulder.   [phone]

## 2021-10-06 NOTE — Telephone Encounter (Signed)
She needs to decide if she wants surgery or not.  I talked with her extensively 10 days ago on a Saturday.  It is really her decision if she wants to take the risk.  I left it in her report as to whether or not she wanted to elect to have open biceps tenodesis and arthroscopy of the shoulder to evaluate rotator cuff which in all likelihood remains intact.

## 2021-10-07 NOTE — Telephone Encounter (Signed)
Please see below. Message has been sent to Dr. Marlou Sa that patient wants to proceed with surgery. I explained to her that once blue sheet was completed, etc, you would reach out to her.

## 2021-10-07 NOTE — Telephone Encounter (Signed)
Patient wants to proceed with surgery. She states that she knows it is 50/50, but she is hopeful that the 50 will give her some use of her arm back. I advised I would send you the message and she would hear from [place] to schedule.

## 2021-10-07 NOTE — Telephone Encounter (Signed)
Hi Mike.  Takela wants biceps tenodesis.  Is this something you want to do or would you like me to do it?Marland Kitchen  Happy with either 1.

## 2021-10-08 NOTE — Telephone Encounter (Signed)
Happy to do it but I also understand if you want to since you saw her as well.  I can call her this weekend to ask if she has a preference between you or me.

## 2021-10-08 NOTE — Telephone Encounter (Signed)
Sounds good thx

## 2021-10-09 NOTE — Telephone Encounter (Signed)
Spoke to [employer] and she doesn't have a preference but she just wants to get it done and since I've been seeing her for the longest time, she said I could do it.  Thanks again for seeing her.

## 2021-10-10 NOTE — Telephone Encounter (Signed)
No problem good luck I think tenodesis has the best chance of helping her. Even tho the shot did not give relief the tendon edge was flapping around proximal to the pec

## 2021-10-19 ENCOUNTER — Ambulatory Visit: Payer: Self-pay | Attending: Critical Care Medicine | Admitting: Clinical

## 2021-10-19 ENCOUNTER — Other Ambulatory Visit: Payer: Self-pay

## 2021-10-19 DIAGNOSIS — F330 Major depressive disorder, recurrent, mild: Secondary | ICD-10-CM

## 2021-10-19 NOTE — BH Specialist Note (Signed)
Integrated Behavioral Health Follow Up In-Person Visit  MRN: [id number] Name: Meagan Flores  Number of [place] Clinician visits: 5/6 Session Start time: 8:40am  Session End time: 9:10am Total time: 30 minutes  Types of Service: Individual psychotherapy  Interpretor:No. Interpretor Name and Language: N/A  Subjective: Meagan Flores is a 55 y.o. female accompanied by  self Patient was referred by PCP Asencion Noble, MD for depression and anxiety. Patient reports the following symptoms/concerns: Reports feeling depressed at times, trouble sleeping, decreased energy, anxious, and worrying. Reports that she is missing her parents due to the upcoming holidays and also it being the time that her father passed away. Reports that she continues to have difficulty adjusting her physical health and allowing others to help her. Reports that she continues to experience physical pain.  Duration of problem: 2 years; Severity of problem: mild  Objective: Mood: Depressed and Affect: Appropriate Risk of harm to self or others: No plan to harm self or others  Life Context: Family and Social: Reports her spouse and children as support system.  School/Work: Reports that she is unemployed due to having a shoulder injury. Reports that her spouse provides financially. Reports that she has applied for disability and has an attorney assisting her. Self-Care: Pt uses tobacco as coping skill. Life Changes: Reports that her parents passed away about two years ago and she has been trying to adjust. Reports that she has also had a shoulder injury that her caused her to be unable to work. Pt plans to have surgery on her shoulder.  Patient and/or Family's Strengths/Protective Factors: Concrete supports in place (healthy food, safe environments, etc.)  Goals Addressed: Patient will:  Reduce symptoms of: anxiety and depression   Increase knowledge and/or ability of: coping skills   Demonstrate  ability to: Increase adequate support systems for patient/family and Begin healthy grieving over loss  Progress towards Goals: Revised and Ongoing  Interventions: Interventions utilized:  Mindfulness or Psychologist, educational, CBT Cognitive Behavioral Therapy, Supportive Counseling, and Psychoeducation and/or Health Education Standardized Assessments completed: Not Needed  Patient and/or Family Response: Pt receptive to tx. Pt receptive to psychoeducation provided on grief. Pt receptive to cognitive restructuring utilized to assist pt with cognitive processing and decrease unhelpful thoughts. Pt receptive to normalization of anxiety with adjusting to life changes. Pt will continue deep breathing exercises and progressive muscle relaxation.   Patient Centered Plan: Patient is on the following Treatment Plan(s): Depression, anxiety, and grief  Assessment: Denies SI/HI. Denies auditory/visual hallucinations. No safety risks. Patient currently experiencing depression with grief and adjusting to physical health changes. Pt is having difficulty with holiday season due to it reminding her about her parents. Pt is also continuing to have difficulty adjusting to physical health. Pt has difficulty with allowing people to support her though she has supported others for several years. Pt has difficulty with cognitive processing skills. Pt continues to be resilient and spend time with family despite pain.   Patient may benefit from continuing brief therapy. LCSWA provided psychoeducation on grief. LCSWA utilized cognitive restructuring to assist with cognitive processing and decrease negative thoughts. LCSWA attempted to normalize pt's anxiety with adjusting to life changes. LCSWA encouraged pt to continue deep breathing exercises and progressive muscle relaxation. LCSWA will fu with pt.  Plan: Follow up with behavioral health clinician on : 11/02/21 Behavioral recommendations: Utilize deep breathing exercises  and progressive muscle relaxation. Referral(s): [place] (In Clinic) "From scale of 1-10, how likely are you  to follow plan?": 10  Makael Stein C Dimitrius Steedman, LCSW

## 2021-11-02 ENCOUNTER — Telehealth: Payer: Self-pay | Admitting: Critical Care Medicine

## 2021-11-02 ENCOUNTER — Ambulatory Visit: Payer: Self-pay | Admitting: Clinical

## 2021-11-02 NOTE — Telephone Encounter (Signed)
Copied from [place] [phone]. Topic: Appointment Scheduling - Scheduling Inquiry for Clinic >> Nov 02, 2021  8:42 AM Alanda Slim E wrote: Reason for CRM: Pt called and needs to reschedule her appt today that was with Asante / please advise

## 2021-11-04 NOTE — Telephone Encounter (Signed)
Pt called wanting to speak with McCoy, Asante C, LCSW and to also schedule an appt with her, please advise.

## 2021-11-05 NOTE — Telephone Encounter (Signed)
Spoke with pt and rescheduled appt for 11/18/21.

## 2021-11-18 ENCOUNTER — Ambulatory Visit: Payer: Self-pay | Admitting: Clinical

## 2021-12-04 ENCOUNTER — Other Ambulatory Visit: Payer: Self-pay

## 2021-12-04 ENCOUNTER — Encounter (HOSPITAL_BASED_OUTPATIENT_CLINIC_OR_DEPARTMENT_OTHER): Payer: Self-pay | Admitting: Orthopaedic Surgery

## 2021-12-08 NOTE — Progress Notes (Signed)
I called patient Meagan Flores today to ask if she was still taking her metoprolol and Valsartan. She states no that she had stopped both around a month ago. She stated that she still had some of both and I encouraged her to restart both and call her cardiology office for a recheck. I called Debbie at Dr Phoebe Sharps office to update her that patient will need a cards followup before surgery on 12-16-21.

## 2021-12-09 ENCOUNTER — Telehealth: Payer: Self-pay | Admitting: Orthopaedic Surgery

## 2021-12-09 ENCOUNTER — Ambulatory Visit: Payer: Self-pay | Attending: Critical Care Medicine | Admitting: Clinical

## 2021-12-09 ENCOUNTER — Other Ambulatory Visit: Payer: Self-pay

## 2021-12-09 DIAGNOSIS — F330 Major depressive disorder, recurrent, mild: Secondary | ICD-10-CM

## 2021-12-09 NOTE — Telephone Encounter (Signed)
Pt called stating she was supposed to have surgery on 12/16/21 but her cardiologist still needs to give his clearance. Pt would like a CB when our office has reached out so she can be updated please.   Cardiologist# [phone]  Pt# [phone]

## 2021-12-10 ENCOUNTER — Telehealth: Payer: Self-pay

## 2021-12-10 NOTE — Telephone Encounter (Signed)
° °  Primary Cardiologist: None  Chart reviewed as part of pre-operative protocol coverage. Given past medical history and time since last visit, based on ACC/AHA guidelines, Meagan Flores would be at acceptable risk for the planned procedure without further cardiovascular testing.   I spoke with her on 12/10/21 and she denies chest pain, dyspnea, or other cardiac concern. She is able to complete > 4 METS activity without difficulty.  Her RCRI for MACE is Class II Risk 0.9%/.  Patient was advised that if she develops new symptoms prior to surgery to contact our office to arrange a follow-up appointment.  He verbalized understanding.  I will route this recommendation to the requesting party via Epic fax function and remove from pre-op pool.  Please call with questions.  Emmaline Life, NP-C    12/10/2021, 3:40 PM [place] [address], [address] Office [phone] Fax [phone]

## 2021-12-10 NOTE — Telephone Encounter (Signed)
° °  Pre-operative Risk Assessment    Patient Name: Meagan Flores  DOB: 1966-11-26 MRN: [id number]      Request for Surgical Clearance    Procedure:   Right biceps tenodesis  Date of Surgery:  Clearance 12/16/21                                 Surgeon:  Dr. Mosetta Pigeon Surgeon's Group or Practice Name:  [facility] Phone number:  [phone] Fax number:  [phone] Attention Debbie   Type of Clearance Requested:   - Medical    Type of Anesthesia:  General    Additional requests/questions:    SignedLowella Grip   12/10/2021, 2:13 PM

## 2021-12-11 NOTE — BH Specialist Note (Signed)
Integrated Behavioral Health via Telemedicine Visit  12/09/21 Meagan Flores [id number]  Number of [place] visits: 6 Session Start time: 11:30am  Session End time: 12:00pm Total time: 30  Referring Provider: Asencion Noble, MD Patient/Family location: Home [facility] Provider location: [facility]  All persons participating in visit: Pt and LCSWA Types of Service: Individual psychotherapy and Telephone visit  I connected with Meagan Flores via Telephone and verified that I am speaking with the correct person using two identifiers. Discussed confidentiality: Yes   I discussed the limitations of telemedicine and the availability of in person appointments.  Discussed there is a possibility of technology failure and discussed alternative modes of communication if that failure occurs.  I discussed that engaging in this telemedicine visit, they consent to the provision of behavioral healthcare and the services will be billed under their insurance.  Patient and/or legal guardian expressed understanding and consented to Telemedicine visit: Yes   Presenting Concerns: Patient and/or family reports the following symptoms/concerns: Reports feeling depressed. Reports that she has been missing her mother and being able to go out with her. Reports difficulty adjusting to staying at home and not going to work. Reports wanting to go out more. Duration of problem: 2 years; Severity of problem: mild  Patient and/or Family's Strengths/Protective Factors: Concrete supports in place (healthy food, safe environments, etc.)  Goals Addressed: Patient will:  Reduce symptoms of: anxiety and depression   Increase knowledge and/or ability of: coping skills   Demonstrate ability to: Increase healthy adjustment to current life circumstances, Increase adequate support systems for patient/family, and Begin healthy grieving over loss  Progress towards Goals: Ongoing  Interventions: Interventions utilized:   Mindfulness or Relaxation Training, CBT Cognitive Behavioral Therapy, and Supportive Counseling Standardized Assessments completed: Not Needed  Patient and/or Family Response: Pt receptive to tx. Pt receptive to psychoeducation provided on grief. Pt receptive to cognitive restructuring to decrease unhelpful thoughts and assisted with cognitive processing skills. Pt receptive to identifying activities to do alone and with her spouse. Pt receptive to continuing deep breathing and progressive muscle relaxation.  Assessment: Denies SI/H Patient currently experiencing depression. Pt is having difficulty with grief and not working anymore due to health. Pt appears to miss going out.   Patient may benefit from brief interventions. LCSWA provided psychoeducation on grief. LCSWA utilized cognitive restructuring to decrease unhelpful thoughts and assisted with cognitive processing skills. LCSWA encouraged pt to identify activities to do alone and with her spouse outside of the house. LCSWA encouraged pt to continue deep breathing and progressive muscle relaxation.  Plan: Follow up with behavioral health clinician on : 12/30/21 Behavioral recommendations: Utilize deep breathing and progressive muscle relaxation. Referral(s): [place] (In Clinic)  I discussed the assessment and treatment plan with the patient and/or parent/guardian. They were provided an opportunity to ask questions and all were answered. They agreed with the plan and demonstrated an understanding of the instructions.   They were advised to call back or seek an in-person evaluation if the symptoms worsen or if the condition fails to improve as anticipated.  Heriberto Stmartin C Dreon Pineda, LCSW

## 2021-12-16 ENCOUNTER — Ambulatory Visit (HOSPITAL_BASED_OUTPATIENT_CLINIC_OR_DEPARTMENT_OTHER)
Admission: RE | Admit: 2021-12-16 | Discharge: 2021-12-16 | Disposition: A | Discharge status: Home / Self Care | Payer: Self-pay | Attending: Orthopaedic Surgery | Admitting: Orthopaedic Surgery

## 2021-12-16 ENCOUNTER — Ambulatory Visit (HOSPITAL_BASED_OUTPATIENT_CLINIC_OR_DEPARTMENT_OTHER): Payer: Self-pay | Admitting: Anesthesiology

## 2021-12-16 ENCOUNTER — Encounter (HOSPITAL_BASED_OUTPATIENT_CLINIC_OR_DEPARTMENT_OTHER)
Admission: RE | Disposition: A | Discharge status: Home / Self Care | Payer: Self-pay | Source: Home / Self Care | Attending: Orthopaedic Surgery

## 2021-12-16 ENCOUNTER — Encounter (HOSPITAL_BASED_OUTPATIENT_CLINIC_OR_DEPARTMENT_OTHER): Payer: Self-pay | Admitting: Orthopaedic Surgery

## 2021-12-16 ENCOUNTER — Other Ambulatory Visit: Payer: Self-pay

## 2021-12-16 DIAGNOSIS — S46211A Strain of muscle, fascia and tendon of other parts of biceps, right arm, initial encounter: Secondary | ICD-10-CM | POA: Insufficient documentation

## 2021-12-16 DIAGNOSIS — M67921 Unspecified disorder of synovium and tendon, right upper arm: Secondary | ICD-10-CM

## 2021-12-16 DIAGNOSIS — F17210 Nicotine dependence, cigarettes, uncomplicated: Secondary | ICD-10-CM | POA: Insufficient documentation

## 2021-12-16 DIAGNOSIS — I10 Essential (primary) hypertension: Secondary | ICD-10-CM | POA: Insufficient documentation

## 2021-12-16 DIAGNOSIS — Z8673 Personal history of transient ischemic attack (TIA), and cerebral infarction without residual deficits: Secondary | ICD-10-CM | POA: Insufficient documentation

## 2021-12-16 DIAGNOSIS — M19011 Primary osteoarthritis, right shoulder: Secondary | ICD-10-CM | POA: Insufficient documentation

## 2021-12-16 DIAGNOSIS — X58XXXA Exposure to other specified factors, initial encounter: Secondary | ICD-10-CM | POA: Insufficient documentation

## 2021-12-16 DIAGNOSIS — E890 Postprocedural hypothyroidism: Secondary | ICD-10-CM | POA: Insufficient documentation

## 2021-12-16 HISTORY — PX: BICEPT TENODESIS: SHX5116

## 2021-12-16 SURGERY — TENODESIS, BICEPS
Anesthesia: Regional | Laterality: Right

## 2021-12-16 MED ORDER — BUPIVACAINE LIPOSOME 1.3 % IJ SUSP
INTRAMUSCULAR | Status: DC | PRN
  Administered 2021-12-16: 10 mL via PERINEURAL

## 2021-12-16 MED ORDER — ACETAMINOPHEN 10 MG/ML IV SOLN: 1000.0000 mg | Freq: Once | INTRAVENOUS | Status: DC | PRN

## 2021-12-16 MED ORDER — DEXAMETHASONE SODIUM PHOSPHATE 4 MG/ML IJ SOLN
INTRAMUSCULAR | Status: DC | PRN
  Administered 2021-12-16: 4 mg via INTRAVENOUS

## 2021-12-16 MED ORDER — PROMETHAZINE HCL 25 MG/ML IJ SOLN: 6.2500 mg | INTRAMUSCULAR | Status: DC | PRN

## 2021-12-16 MED ORDER — ROCURONIUM BROMIDE 10 MG/ML (PF) SYRINGE
PREFILLED_SYRINGE | INTRAVENOUS | Status: AC
  Filled 2021-12-16: qty 10

## 2021-12-16 MED ORDER — EPHEDRINE SULFATE 50 MG/ML IJ SOLN
INTRAMUSCULAR | Status: DC | PRN
Start: 2021-12-16 — End: 2021-12-16
  Administered 2021-12-16: 10 mg via INTRAVENOUS

## 2021-12-16 MED ORDER — CEFAZOLIN SODIUM-DEXTROSE 2-4 GM/100ML-% IV SOLN
2.0000 g | INTRAVENOUS | Status: AC
  Administered 2021-12-16: 2 g via INTRAVENOUS

## 2021-12-16 MED ORDER — FENTANYL CITRATE (PF) 100 MCG/2ML IJ SOLN
INTRAMUSCULAR | Status: AC
  Filled 2021-12-16: qty 2

## 2021-12-16 MED ORDER — ROCURONIUM BROMIDE 100 MG/10ML IV SOLN
INTRAVENOUS | Status: DC | PRN
  Administered 2021-12-16: 60 mg via INTRAVENOUS

## 2021-12-16 MED ORDER — PROPOFOL 10 MG/ML IV BOLUS
INTRAVENOUS | Status: AC
  Filled 2021-12-16: qty 20

## 2021-12-16 MED ORDER — PROPOFOL 10 MG/ML IV BOLUS
INTRAVENOUS | Status: DC | PRN
  Administered 2021-12-16: 180 mg via INTRAVENOUS

## 2021-12-16 MED ORDER — SODIUM CHLORIDE 0.9 % IR SOLN
Status: DC | PRN
  Administered 2021-12-16: 1000 mL

## 2021-12-16 MED ORDER — LACTATED RINGERS IV SOLN: INTRAVENOUS | Status: DC

## 2021-12-16 MED ORDER — FENTANYL CITRATE (PF) 100 MCG/2ML IJ SOLN: 25.0000 ug | INTRAMUSCULAR | Status: DC | PRN

## 2021-12-16 MED ORDER — FENTANYL CITRATE (PF) 100 MCG/2ML IJ SOLN
INTRAMUSCULAR | Status: DC | PRN
  Administered 2021-12-16: 100 ug via INTRAVENOUS

## 2021-12-16 MED ORDER — OXYCODONE-ACETAMINOPHEN 5-325 MG PO TABS: 1.0000 | ORAL_TABLET | Freq: Three times a day (TID) | ORAL | 0 refills | Status: DC | PRN

## 2021-12-16 MED ORDER — FENTANYL CITRATE (PF) 100 MCG/2ML IJ SOLN
50.0000 ug | Freq: Once | INTRAMUSCULAR | Status: AC
  Administered 2021-12-16: 50 ug via INTRAVENOUS

## 2021-12-16 MED ORDER — MIDAZOLAM HCL 2 MG/2ML IJ SOLN
INTRAMUSCULAR | Status: AC
  Filled 2021-12-16: qty 2

## 2021-12-16 MED ORDER — CEFAZOLIN SODIUM-DEXTROSE 2-4 GM/100ML-% IV SOLN
INTRAVENOUS | Status: AC
  Filled 2021-12-16: qty 100

## 2021-12-16 MED ORDER — SUGAMMADEX SODIUM 200 MG/2ML IV SOLN
INTRAVENOUS | Status: DC | PRN
  Administered 2021-12-16: 400 mg via INTRAVENOUS

## 2021-12-16 MED ORDER — BUPIVACAINE HCL (PF) 0.5 % IJ SOLN
INTRAMUSCULAR | Status: DC | PRN
Start: 2021-12-16 — End: 2021-12-16
  Administered 2021-12-16: 15 mL via PERINEURAL

## 2021-12-16 MED ORDER — MIDAZOLAM HCL 2 MG/2ML IJ SOLN
1.0000 mg | Freq: Once | INTRAMUSCULAR | Status: AC
  Administered 2021-12-16: 1 mg via INTRAVENOUS

## 2021-12-16 MED ORDER — PHENYLEPHRINE HCL-NACL 20-0.9 MG/250ML-% IV SOLN
INTRAVENOUS | Status: DC | PRN
  Administered 2021-12-16: 40 ug/min via INTRAVENOUS

## 2021-12-16 MED ORDER — LIDOCAINE 2% (20 MG/ML) 5 ML SYRINGE
INTRAMUSCULAR | Status: AC
  Filled 2021-12-16: qty 5

## 2021-12-16 SURGICAL SUPPLY — 53 items
ANCH SUT 2 JK 1.5X2.9 2 LD (Anchor) ×2 IMPLANT
ANCH SUT KNTLS STRL SHLDR SYS (Anchor) ×1 IMPLANT
ANCHOR SUT JK SZ 2 2.9 DBL SL (Anchor) ×2 IMPLANT
ANCHOR SUT QUATTRO KNTLS 4.5 (Anchor) ×1 IMPLANT
APL SKNCLS STERI-STRIP NONHPOA (GAUZE/BANDAGES/DRESSINGS) ×1
BENZOIN TINCTURE PRP APPL 2/3 (GAUZE/BANDAGES/DRESSINGS) ×1 IMPLANT
BIT DRILL JUGRKNT W/NDL BIT2.9 (DRILL) IMPLANT
BLADE SURG 15 STRL LF DISP TIS (BLADE) ×2 IMPLANT
BLADE SURG 15 STRL SS (BLADE) ×4
BNDG COHESIVE 1X5 TAN STRL LF (GAUZE/BANDAGES/DRESSINGS) ×1 IMPLANT
DRAPE IMP U-DRAPE 54X76 (DRAPES) ×2 IMPLANT
DRAPE INCISE IOBAN 66X45 STRL (DRAPES) ×2 IMPLANT
DRAPE U-SHAPE 47X51 STRL (DRAPES) ×4 IMPLANT
DRAPE U-SHAPE 76X120 STRL (DRAPES) ×4 IMPLANT
DRILL JUGGERKNOT W/NDL BIT 2.9 (DRILL) ×2
DRSG MEPILEX BORDER 4X8 (GAUZE/BANDAGES/DRESSINGS) IMPLANT
DURAPREP 26ML APPLICATOR (WOUND CARE) ×3 IMPLANT
ELECT REM PT RETURN 9FT ADLT (ELECTROSURGICAL) ×2
ELECTRODE REM PT RTRN 9FT ADLT (ELECTROSURGICAL) ×1 IMPLANT
GAUZE SPONGE 4X4 12PLY STRL (GAUZE/BANDAGES/DRESSINGS) ×2 IMPLANT
GLOVE SURG NEOP MICRO LF SZ7.5 (GLOVE) ×2 IMPLANT
GLOVE SURG SYN 7.5  E (GLOVE) ×2
GLOVE SURG SYN 7.5 E (GLOVE) ×1 IMPLANT
GLOVE SURG SYN 7.5 PF PI (GLOVE) ×1 IMPLANT
GLOVE SURG UNDER POLY LF SZ7 (GLOVE) ×2 IMPLANT
GLOVE SURG UNDER POLY LF SZ7.5 (GLOVE) ×2 IMPLANT
GOWN STRL REIN XL XLG (GOWN DISPOSABLE) ×4 IMPLANT
GOWN STRL REUS W/ TWL LRG LVL3 (GOWN DISPOSABLE) ×1 IMPLANT
GOWN STRL REUS W/ TWL XL LVL3 (GOWN DISPOSABLE) ×1 IMPLANT
GOWN STRL REUS W/TWL LRG LVL3 (GOWN DISPOSABLE) ×2
GOWN STRL REUS W/TWL XL LVL3 (GOWN DISPOSABLE) ×4 IMPLANT
MANIFOLD NEPTUNE II (INSTRUMENTS) IMPLANT
PACK ARTHROSCOPY DSU (CUSTOM PROCEDURE TRAY) ×2 IMPLANT
PACK BASIN DAY SURGERY FS (CUSTOM PROCEDURE TRAY) ×2 IMPLANT
PENCIL SMOKE EVACUATOR (MISCELLANEOUS) ×2 IMPLANT
SHEET MEDIUM DRAPE 40X70 STRL (DRAPES) ×2 IMPLANT
SLEEVE SCD COMPRESS KNEE MED (STOCKING) ×2 IMPLANT
SLING ARM FOAM STRAP LRG (SOFTGOODS) ×1 IMPLANT
SPONGE T-LAP 18X18 ~~LOC~~+RFID (SPONGE) ×4 IMPLANT
STRIP CLOSURE SKIN 1/2X4 (GAUZE/BANDAGES/DRESSINGS) ×1 IMPLANT
SUCTION FRAZIER HANDLE 10FR (MISCELLANEOUS) ×2
SUCTION TUBE FRAZIER 10FR DISP (MISCELLANEOUS) ×1 IMPLANT
SUT ETHILON 3 0 PS 1 (SUTURE) ×2 IMPLANT
SUT FIBERWIRE #2 38 T-5 BLUE (SUTURE)
SUT MNCRL AB 4-0 PS2 18 (SUTURE) ×2 IMPLANT
SUT VIC AB 0 CT1 27 (SUTURE) ×2
SUT VIC AB 0 CT1 27XBRD ANBCTR (SUTURE) ×1 IMPLANT
SUT VIC AB 2-0 CT1 27 (SUTURE) ×2
SUT VIC AB 2-0 CT1 TAPERPNT 27 (SUTURE) ×1 IMPLANT
SUTURE FIBERWR #2 38 T-5 BLUE (SUTURE) IMPLANT
SYR BULB EAR ULCER 3OZ GRN STR (SYRINGE) ×2 IMPLANT
TOWEL GREEN STERILE FF (TOWEL DISPOSABLE) ×2 IMPLANT
YANKAUER SUCT BULB TIP NO VENT (SUCTIONS) ×1 IMPLANT

## 2021-12-16 NOTE — Anesthesia Procedure Notes (Signed)
Anesthesia Regional Block: Interscalene brachial plexus block   Pre-Anesthetic Checklist: , timeout performed,  Correct Patient, Correct Site, Correct Laterality,  Correct Procedure, Correct Position, site marked,  Risks and benefits discussed,  Surgical consent,  Pre-op evaluation,  At surgeon's request and post-op pain management  Laterality: Right  Prep: Dura Prep       Needles:  Injection technique: Single-shot  Needle Type: Echogenic Stimulator Needle     Needle Length: 5cm  Needle Gauge: 20     Additional Needles:   Procedures:,,,, ultrasound used (permanent image in chart),,    Narrative:  Start time: 12/16/2021 1:29 PM End time: 12/16/2021 1:35 PM Injection made incrementally with aspirations every 5 mL.  Performed by: Personally  Anesthesiologist: Darral Dash, DO  Additional Notes: Patient identified. Risks/Benefits/Options discussed with patient including but not limited to bleeding, infection, nerve damage, failed block, incomplete pain control. Patient expressed understanding and wished to proceed. All questions were answered. Sterile technique was used throughout the entire procedure. Please see nursing notes for vital signs. Aspirated in 5cc intervals with injection for negative confirmation. Patient was given instructions on fall risk and not to get out of bed. All questions and concerns addressed with instructions to call with any issues or inadequate analgesia.

## 2021-12-16 NOTE — Anesthesia Procedure Notes (Signed)
Procedure Name: Intubation Date/Time: 12/16/2021 2:22 PM Performed by: Maryella Shivers, CRNA Pre-anesthesia Checklist: Suction available Patient Re-evaluated:Patient Re-evaluated prior to induction Oxygen Delivery Method: Circle system utilized Preoxygenation: Pre-oxygenation with 100% oxygen Induction Type: IV induction Ventilation: Mask ventilation without difficulty Laryngoscope Size: Mac and 3 Tube type: Oral Tube size: 7.5 mm Number of attempts: 1 Airway Equipment and Method: Stylet and Oral airway Placement Confirmation: ETT inserted through vocal cords under direct vision, positive ETCO2 and breath sounds checked- equal and bilateral Secured at: 21 cm Tube secured with: Tape Dental Injury: Teeth and Oropharynx as per pre-operative assessment

## 2021-12-16 NOTE — Progress Notes (Signed)
Assisted Dr. Jana Half with right, ultrasound guided, interscalene  block. Side rails up, monitors on throughout procedure. See vital signs in flow sheet. Tolerated Procedure well.

## 2021-12-16 NOTE — H&P (Signed)
PREOPERATIVE H&P  Chief Complaint: RIGHT SHOULDER PAIN, BICEPS TENDINOPATHY  HPI: Meagan Flores is a 56 y.o. female who presents for surgical treatment of RIGHT SHOULDER PAIN, BICEPS TENDINOPATHY.  She denies any changes in medical history.  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   Prediabetes    Preop cardiovascular exam 11/25/2020   Pulmonary hypertension, unspecified ([place]) 11/25/2020   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   Rotator cuff impingement syndrome of right shoulder 09/30/2020   Skin rash 02/17/2021   Stroke [facility])    Tendinopathy of right biceps tendon 09/30/2020   Thyroid disease    TIA (transient ischemic attack) 04/15/2018   Tobacco use 09/28/2016   Past Surgical History:  Procedure Laterality Date   PARTIAL HYSTERECTOMY     right arm sx     SHOULDER ARTHROSCOPY WITH BICEPSTENOTOMY Right 01/21/2021   Procedure: SHOULDER ARTHROSCOPY WITH BICEPSTENOTOMY;  Surgeon: Leandrew Koyanagi, MD;  Location: [place];  Service: Orthopedics;  Laterality: Right;   SHOULDER ARTHROSCOPY WITH DISTAL CLAVICLE RESECTION Right 01/12/2017   Procedure: RIGHT SHOULDER ARTHROSCOPY WITH  SUBACROMIAL DECOMPRESSION, DISTAL CLAVICLE EXCISION, extensive debridement;  Surgeon: Leandrew Koyanagi, MD;  Location: [place];  Service: Orthopedics;  Laterality: Right;   THYMECTOMY  2004   THYROIDECTOMY     Social History   Socioeconomic  History   Marital status: Married    Spouse name: Not on file   Number of children: Not on file   Years of education: Not on file   Highest education level: Not on file  Occupational History   Not on file  Tobacco Use   Smoking status: Every Day    Packs/day: 0.50    Years: 34.00    Pack years: 17.00    Types: Cigarettes   Smokeless tobacco: Never  Vaping Use   Vaping Use: Never used  Substance and Sexual Activity   Alcohol use: Yes    Comment: occassional   Drug use: No   Sexual activity: Not on file  Other Topics Concern   Not on file  Social History Narrative   Not on file   Social Determinants of Health   Financial Resource Strain: Not on file  Food Insecurity: Not on file  Transportation Needs: Not on file  Physical Activity: Not on file  Stress: Not on file  Social Connections: Not on file   Family History  Problem Relation Age of Onset   Stomach cancer Sister    Colon polyps Sister    Esophageal cancer Neg Hx    Rectal cancer Neg Hx    Colon cancer Neg Hx    Breast cancer Neg Hx    Allergies  Allergen Reactions   Sulfa Antibiotics Hives   Prior to Admission medications   Medication Sig Start Date End Date Taking? Authorizing Provider  aspirin EC 81 MG tablet Take 1 tablet (81 mg total) by mouth daily. 09/29/21  Yes Elsie Stain, MD  atorvastatin (LIPITOR) 40 MG tablet Take 1 tablet (40 mg total) by mouth daily at 6 PM. 09/29/21  Yes Elsie Stain, MD  levothyroxine (EUTHYROX) 50 MCG tablet Take 2 tablets (100 mcg total) by mouth daily before breakfast. 09/29/21  Yes Elsie Stain, MD  metoprolol succinate (TOPROL-XL) 50 MG 24 hr tablet Take 1 tablet (50 mg total) by mouth daily. Take with or immediately following a meal. 09/29/21 12/28/21 Yes Elsie Stain, MD  valsartan (DIOVAN) 160 MG tablet Take 1 tablet (160 mg total) by mouth daily. 09/29/21  Yes Elsie Stain, MD  clobetasol cream (TEMOVATE) 9.67 % Apply 1 application topically 2  (two) times daily. 09/29/21   Elsie Stain, MD  pregabalin (LYRICA) 150 MG capsule Take 1 capsule (150 mg total) by mouth 2 (two) times daily. 09/29/21   Elsie Stain, MD     Positive ROS: All other systems have been reviewed and were otherwise negative with the exception of those mentioned in the HPI and as above.  Physical Exam: General: Alert, no acute distress Cardiovascular: No pedal edema Respiratory: No cyanosis, no use of accessory musculature GI: abdomen soft Skin: No lesions in the area of chief complaint Neurologic: Sensation intact distally Psychiatric: Patient is competent for consent with normal mood and affect Lymphatic: no lymphedema  MUSCULOSKELETAL: exam stable  Assessment: RIGHT SHOULDER PAIN, BICEPS TENDINOPATHY  Plan: Plan for Procedure(s): RIGHT BICEPS TENODESIS  The risks benefits and alternatives were discussed with the patient including but not limited to the risks of nonoperative treatment, versus surgical intervention including infection, bleeding, nerve injury,  blood clots, cardiopulmonary complications, morbidity, mortality, among others, and they were willing to proceed.   Preoperative templating of the joint replacement has been completed, documented, and submitted to the Operating Room personnel in order to optimize intra-operative equipment management.   Eduard Roux, MD 12/16/2021 12:07 PM

## 2021-12-16 NOTE — Transfer of Care (Signed)
Immediate Anesthesia Transfer of Care Note  Patient: Meagan Flores  Procedure(s) Performed: RIGHT BICEPS TENODESIS (Right)  Patient Location: PACU  Anesthesia Type:GA combined with regional for post-op pain  Level of Consciousness: sedated  Airway & Oxygen Therapy: Patient Spontanous Breathing and Patient connected to face mask oxygen  Post-op Assessment: Report given to RN and Post -op Vital signs reviewed and stable  Post vital signs: Reviewed and stable  Last Vitals:  Vitals Value Taken Time  BP 113/76 12/16/21 1551  Temp    Pulse 69 12/16/21 1552  Resp 14 12/16/21 1552  SpO2 99 % 12/16/21 1552  Vitals shown include unvalidated device data.  Last Pain:  Vitals:   12/16/21 1159  TempSrc: Oral  PainSc: 6       Patients Stated Pain Goal: 4 (83/67/25 5001)  Complications: No notable events documented.

## 2021-12-16 NOTE — Anesthesia Preprocedure Evaluation (Addendum)
Anesthesia Evaluation  Patient identified by MRN, date of birth, ID band Patient awake    Reviewed: Allergy & Precautions, NPO status , Patient's Chart, lab work & pertinent test results  Airway Mallampati: II  TM Distance: >3 FB Neck ROM: Full    Dental no notable dental hx.    Pulmonary neg pulmonary ROS, Current Smoker and Patient abstained from smoking.,    Pulmonary exam normal        Cardiovascular hypertension, Pt. on medications and Pt. on home beta blockers  Rhythm:Regular Rate:Normal     Neuro/Psych Depression TIA Neuromuscular disease CVA    GI/Hepatic negative GI ROS, Neg liver ROS,   Endo/Other  Hypothyroidism   Renal/GU negative Renal ROS  negative genitourinary   Musculoskeletal  (+) Arthritis , Osteoarthritis,    Abdominal Normal abdominal exam  (+)   Peds  Hematology negative hematology ROS (+)   Anesthesia Other Findings   Reproductive/Obstetrics                             Anesthesia Physical Anesthesia Plan  ASA: 3  Anesthesia Plan: General and Regional   Post-op Pain Management: Regional block   Induction: Intravenous  PONV Risk Score and Plan: 2 and Ondansetron, Dexamethasone, Midazolam and Treatment may vary due to age or medical condition  Airway Management Planned: Mask and Oral ETT  Additional Equipment: None  Intra-op Plan:   Post-operative Plan: Extubation in OR  Informed Consent: I have reviewed the patients History and Physical, chart, labs and discussed the procedure including the risks, benefits and alternatives for the proposed anesthesia with the patient or authorized representative who has indicated his/her understanding and acceptance.     Dental advisory given  Plan Discussed with: CRNA  Anesthesia Plan Comments:         Anesthesia Quick Evaluation

## 2021-12-16 NOTE — Op Note (Signed)
Date of Surgery: 12/16/2021  INDICATIONS: The patient is a 56 year old female with right shoulder pain that has failed conservative treatment;  The patient did consent to the procedure after discussion of the risks and benefits.  PREOPERATIVE DIAGNOSIS:  1.  Right biceps tendinopathy status post tenotomy  POSTOPERATIVE DIAGNOSIS: Same.  PROCEDURE:   Right proximal long head biceps tenodesis Tenolysis of the long head biceps tendon  SURGEON: N. Eduard Roux, M.D.  ASSIST: Madalyn Rob, PA-C  ANESTHESIA:  general, regional  IV FLUIDS AND URINE: See anesthesia.  ESTIMATED BLOOD LOSS: 50 mL.  IMPLANTS:  Implant Name Type Inv. Item Serial No. Manufacturer Lot No. LRB No. Used Action  BIOMET ZIMMER 2.9 JUGGERKNOW SOFT ANCHOR    [id number]  [id number] Right 1 Implanted and Explanted  ZIMMER BIOMET JUGGERKNOT SOFT ANCHOR 2.9   [id number]  [id number] Right 1 Implanted  ANCHOR SUT QUATTRO KNTLS 4.5 - [id number] Anchor ANCHOR SUT QUATTRO KNTLS 4.5  ZIMMER RECON(ORTH,TRAU,BIO,SG) [id number] Right 1 Implanted    COMPLICATIONS: None.  DESCRIPTION OF PROCEDURE: The patient was brought to the operating room and placed supine on the operating table.  The patient had been signed prior to the procedure and this was documented. The patient had the anesthesia placed by the anesthesiologist.  A time-out was performed to confirm that this was the correct patient, site, side and location. The patient did receive antibiotics prior to the incision and was re-dosed during the procedure as needed at indicated intervals.  The patient was then positioned into the beach chair position with all bony prominences well padded and neutral C spine. The patient had the operative extremity prepped and draped in the standard surgical fashion.    A 5 cm incision was created down the arm from the anterior lateral corner of the acromion.  Dissection was carried down to the subcutaneous tissue and the interval between the  raphae between the anterior and middle deltoid was identified and developed.  The deltoid was elevated off of the subdeltoid bursa.  Retractors were placed for visualization.  The bursa was then excised.  The arm was then externally rotated and the lesser tuberosity was palpated.  The transverse humeral ligament was incised from the lateral attachment.  The biceps tendon was found within the groove.  I did not see any inflammatory fluid.  The tendon demonstrated moderate tendinopathy.  The tendon was then released from the entire groove.  The subscapularis tendon attachment was left intact.  The tenolysis of the biceps tendon was then performed with tenotomy scissors back to stable tissue.  There was interstitial tears within the tendon.  Excess tendon was then removed.  The appropriate tension was determined.  The bone within the bicipital groove was then prepared with a larger to create a bleeding bed of bone.  We then placed a 2.9 mm juggerknot suture anchor in the distal extent of the bicipital groove.  We then placed locking Mason-Allen suture in the proximal stump of the tendon.  We first attempted to tenodesed the proximal and with another 2.9 mm juggernaut suture anchor but this did not provide adequate fixation and the anchor pulled out we used therefore we used a 4.5 mm Quatro anchor which provided excellent fixation.   The distal anchor was then used to cinch the tendon down to the bicipital groove which gave great approximation of the tendon down to the bleeding bone.  The surgical site was then thoroughly irrigated and closed in a layered fashion.  The bursal  surface of the supraspinatus was evaluated and did not demonstrate any full-thickness tears.  Sterile dressings were applied.  Patient tolerated procedure well no immediate complications. Tawanna Cooler, my PA, was a medical necessity for the entirety of the surgery including opening, closing, limb positioning, retracting, exposing, and  repairing.  POSTOPERATIVE PLAN: Patient will be discharged home and follow-up in 2 weeks for suture removal.  She is to wear the sling for comfort.  No lifting more than 2 pounds for 6 weeks.  Azucena Cecil, MD [facility] 3:19 PM

## 2021-12-16 NOTE — Discharge Instructions (Addendum)
Post-operative patient instructions  Shoulder Arthroscopy   Ice:  Place intermittent ice or cooler pack over your shoulder, 30 minutes on and 30 minutes off.  Continue this for the first 72 hours after surgery, then save ice for use after therapy sessions or on more active days.   Weight:  You may NOT bear weight on your arm. Motion:  Perform gentle shoulder motion as tolerated Dressing:  Perform 1st dressing change at 2 days postoperative. A moderate amount of blood tinged drainage is to be expected.  So if you bleed through the dressing on the first or second day or if you have fevers, it is fine to change the dressing/check the wounds early and redress wound.  If it bleeds through again, or if the incisions are leaking frank blood, please call the office. May change dressing every 1-2 days thereafter to help watch wounds. Can purchase Tegaderm (or 13M Nexcare) water resistant dressings at local pharmacy / [employer]. Shower:  Light shower is ok after 2 days.  Please take shower, NO bath. Recover with gauze and ace wrap to help keep wounds protected.   Pain medication:  A narcotic pain medication has been prescribed.  Take as directed.  Typically you need narcotic pain medication more regularly during the first 3 to 5 days after surgery.  Decrease your use of the medication as the pain improves.  Narcotics can sometimes cause constipation, even after a few doses.  If you have problems with constipation, you can take an over the counter stool softener or light laxative.  If you have persistent problems, please notify your physicians office. Physical therapy: Additional activity guidelines to be provided by your physician or physical therapist at follow-up visits.  Driving: Do not recommend driving x 2 weeks post surgical, especially if surgery performed on right side. Should not drive while taking narcotic pain medications. It typically takes at least 2 weeks to restore sufficient neuromuscular  function for normal reaction times for driving safety.  Call [phone] for questions or problems. Evenings you will be forwarded to the hospital operator.  Ask for the orthopaedic physician on call. Please call if you experience:    Redness, foul smelling, or persistent drainage from the surgical site  worsening shoulder pain and swelling not responsive to medication  any calf pain and or swelling of the lower leg  temperatures greater than 101.5 F other questions or concerns   Thank you for allowing [place] to be a part of your care.   Post Anesthesia Home Care Instructions  Activity: Get plenty of rest for the remainder of the day. A responsible individual must stay with you for 24 hours following the procedure.  For the next 24 hours, DO NOT: -Drive a car -Paediatric nurse -Drink alcoholic beverages -Take any medication unless instructed by your physician -Make any legal decisions or sign important papers.  Meals: Start with liquid foods such as gelatin or soup. Progress to regular foods as tolerated. Avoid greasy, spicy, heavy foods. If nausea and/or vomiting occur, drink only clear liquids until the nausea and/or vomiting subsides. Call your physician if vomiting continues.  Special Instructions/Symptoms: Your throat may feel dry or sore from the anesthesia or the breathing tube placed in your throat during surgery. If this causes discomfort, gargle with warm salt water. The discomfort should disappear within 24 hours.  If you had a scopolamine patch placed behind your ear for the management of post- operative nausea and/or vomiting:  1. The medication in  the patch is effective for 72 hours, after which it should be removed.  Wrap patch in a tissue and discard in the trash. Wash hands thoroughly with soap and water. 2. You may remove the patch earlier than 72 hours if you experience unpleasant side effects which may include dry mouth, dizziness or visual disturbances. 3. Avoid  touching the patch. Wash your hands with soap and water after contact with the patch.       Regional Anesthesia Blocks  1. Numbness or the inability to move the "blocked" extremity may last from 3-48 hours after placement. The length of time depends on the medication injected and your individual response to the medication. If the numbness is not going away after 48 hours, call your surgeon.  2. The extremity that is blocked will need to be protected until the numbness is gone and the  Strength has returned. Because you cannot feel it, you will need to take extra care to avoid injury. Because it may be weak, you may have difficulty moving it or using it. You may not know what position it is in without looking at it while the block is in effect.  3. For blocks in the legs and feet, returning to weight bearing and walking needs to be done carefully. You will need to wait until the numbness is entirely gone and the strength has returned. You should be able to move your leg and foot normally before you try and bear weight or walk. You will need someone to be with you when you first try to ensure you do not fall and possibly risk injury.  4. Bruising and tenderness at the needle site are common side effects and will resolve in a few days.  5. Persistent numbness or new problems with movement should be communicated to the surgeon or the [facility] [phone] [place] [phone]).    Information for Discharge Teaching: EXPAREL (bupivacaine liposome injectable suspension)   Your surgeon or anesthesiologist gave you EXPAREL(bupivacaine) to help control your pain after surgery.  EXPAREL is a local anesthetic that provides pain relief by numbing the tissue around the surgical site. EXPAREL is designed to release pain medication over time and can control pain for up to 72 hours. Depending on how you respond to EXPAREL, you may require less pain medication during your  recovery.  Possible side effects: Temporary loss of sensation or ability to move in the area where bupivacaine was injected. Nausea, vomiting, constipation Rarely, numbness and tingling in your mouth or lips, lightheadedness, or anxiety may occur. Call your doctor right away if you think you may be experiencing any of these sensations, or if you have other questions regarding possible side effects.  Follow all other discharge instructions given to you by your surgeon or nurse. Eat a healthy diet and drink plenty of water or other fluids.  If you return to the hospital for any reason within 96 hours following the administration of EXPAREL, it is important for health care providers to know that you have received this anesthetic. A teal colored band has been placed on your arm with the date, time and amount of EXPAREL you have received in order to alert and inform your health care providers. Please leave this armband in place for the full 96 hours following administration, and then you may remove the band.

## 2021-12-17 ENCOUNTER — Encounter (HOSPITAL_BASED_OUTPATIENT_CLINIC_OR_DEPARTMENT_OTHER): Payer: Self-pay | Admitting: Orthopaedic Surgery

## 2021-12-17 MED FILL — Ephedrine Sulfate Inj 50 MG/ML: INTRAMUSCULAR | Qty: 10 | Status: AC

## 2021-12-17 NOTE — Anesthesia Postprocedure Evaluation (Signed)
Anesthesia Post Note  Patient: [name]  Procedure(s) Performed: RIGHT BICEPS TENODESIS (Right)     Patient location during evaluation: PACU Anesthesia Type: Regional and General Level of consciousness: awake and alert Pain management: pain level controlled Vital Signs Assessment: post-procedure vital signs reviewed and stable Respiratory status: spontaneous breathing, nonlabored ventilation, respiratory function stable and patient connected to nasal cannula oxygen Cardiovascular status: blood pressure returned to baseline and stable Postop Assessment: no apparent nausea or vomiting Anesthetic complications: no   No notable events documented.  Last Vitals:  Vitals:   12/16/21 1630 12/16/21 1645  BP: 114/89 119/75  Pulse: (!) 55 (!) 51  Resp: 15 16  Temp:  36.5 C  SpO2: 91% 94%    Last Pain:  Vitals:   12/16/21 1645  TempSrc:   PainSc: 0-No pain                 Belenda Cruise P Victoire Deans

## 2021-12-23 ENCOUNTER — Other Ambulatory Visit: Payer: Self-pay

## 2021-12-23 ENCOUNTER — Ambulatory Visit (INDEPENDENT_AMBULATORY_CARE_PROVIDER_SITE_OTHER): Payer: Self-pay | Admitting: Physician Assistant

## 2021-12-23 ENCOUNTER — Encounter: Payer: Self-pay | Admitting: Orthopaedic Surgery

## 2021-12-23 DIAGNOSIS — M67813 Other specified disorders of tendon, right shoulder: Secondary | ICD-10-CM

## 2021-12-23 MED ORDER — HYDROCODONE-ACETAMINOPHEN 5-325 MG PO TABS: 1.0000 | ORAL_TABLET | Freq: Three times a day (TID) | ORAL | 0 refills | Status: DC | PRN

## 2021-12-23 NOTE — Progress Notes (Signed)
Post-Op Visit Note   Patient: Meagan Flores           Date of Birth: 1966/03/24           MRN: [id number] Visit Date: 12/23/2021 PCP: Elsie Stain, MD   Assessment & Plan:  Chief Complaint:  Chief Complaint  Patient presents with   Right Shoulder - Pain   Visit Diagnoses:  1. Biceps tendonosis of right shoulder     Plan: Patient is a pleasant 56 year old female who comes in today 1 week status post right shoulder open biceps tenodesis, date of surgery 12/16/2021.  She has been doing well.  She is taking Percocet for pain.  She has been compliant wearing her sling.  Examination of the right shoulder reveals a well-healing surgical incision with nylon sutures in place.  No evidence of infection or cellulitis.  She is neurovascular intact distally.  Today, her wound was cleaned and recovered.  She will continue wearing her sling out in public for another 5 weeks.  No lifting right upper extremity for 5 weeks.  Referral for outpatient physical therapy has been made.  She is asked for refill of Percocet we will wean to Norco at this time.  She will follow-up with [place] next week for suture removal.  Call with concerns or questions in the meantime.  Follow-Up Instructions: Return in about 1 week (around 12/30/2021).   Orders:  Orders Placed This Encounter  Procedures   Ambulatory referral to Physical Therapy   No orders of the defined types were placed in this encounter.   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   Tendinopathy of right biceps tendon 09/30/2020    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   Aneurysm ([place]) 03/18/2020   History of TIA (transient ischemic attack) 02/14/2020   History of stroke    Mixed hyperlipidemia    Essential hypertension    Acute pain of left shoulder 09/08/2017   Impingement syndrome of right shoulder 12/24/2016   Tobacco use 09/28/2016   Glenohumeral arthritis 12/27/2014   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   Past Medical History:  Diagnosis Date   Acute pain of left shoulder 09/08/2017   Aneurysm ([place]) 03/18/2020   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   COVID-19 virus infection 02/14/2020   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) tear of right shoulder 09/30/2020   Essential hypertension    Glenohumeral arthritis 12/27/2014   History of stroke    History of TIA (transient ischemic attack) 02/14/2020   Hyperlipidemia    Hypothyroidism    Impingement syndrome of right shoulder 12/24/2016   Mixed hyperlipidemia    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   Prediabetes    Preop cardiovascular exam 11/25/2020   Pulmonary hypertension, unspecified ([place]) 11/25/2020   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   Rotator cuff impingement syndrome of right shoulder 09/30/2020   Skin rash 02/17/2021   Stroke [facility])    Tendinopathy of right biceps tendon 09/30/2020   Thyroid disease    TIA (transient ischemic attack) 04/15/2018   Tobacco use 09/28/2016    Family History  Problem Relation Age of Onset   Stomach cancer Sister  Colon polyps Sister    Esophageal cancer Neg Hx    Rectal cancer Neg Hx    Colon cancer Neg Hx    Breast cancer Neg Hx     Past Surgical History:  Procedure Laterality Date   BICEPT TENODESIS Right 12/16/2021   Procedure: RIGHT BICEPS TENODESIS;  Surgeon: Leandrew Koyanagi, MD;  Location: [place];  Service: Orthopedics;  Laterality: Right;   PARTIAL HYSTERECTOMY     right arm sx     SHOULDER ARTHROSCOPY WITH BICEPSTENOTOMY Right 01/21/2021   Procedure:  SHOULDER ARTHROSCOPY WITH BICEPSTENOTOMY;  Surgeon: Leandrew Koyanagi, MD;  Location: [place];  Service: Orthopedics;  Laterality: Right;   SHOULDER ARTHROSCOPY WITH DISTAL CLAVICLE RESECTION Right 01/12/2017   Procedure: RIGHT SHOULDER ARTHROSCOPY WITH  SUBACROMIAL DECOMPRESSION, DISTAL CLAVICLE EXCISION, extensive debridement;  Surgeon: Leandrew Koyanagi, MD;  Location: [place];  Service: Orthopedics;  Laterality: Right;   THYMECTOMY  2004   THYROIDECTOMY     Social History   Occupational History   Not on file  Tobacco Use   Smoking status: Every Day    Packs/day: 0.50    Years: 34.00    Pack years: 17.00    Types: Cigarettes   Smokeless tobacco: Never  Vaping Use   Vaping Use: Never used  Substance and Sexual Activity   Alcohol use: Yes    Comment: occassional   Drug use: No   Sexual activity: Not on file

## 2021-12-30 ENCOUNTER — Ambulatory Visit (INDEPENDENT_AMBULATORY_CARE_PROVIDER_SITE_OTHER): Payer: Self-pay | Admitting: Orthopaedic Surgery

## 2021-12-30 ENCOUNTER — Encounter: Payer: Self-pay | Admitting: Orthopaedic Surgery

## 2021-12-30 ENCOUNTER — Other Ambulatory Visit: Payer: Self-pay

## 2021-12-30 ENCOUNTER — Ambulatory Visit: Payer: Self-pay | Attending: Critical Care Medicine | Admitting: Clinical

## 2021-12-30 DIAGNOSIS — F330 Major depressive disorder, recurrent, mild: Secondary | ICD-10-CM

## 2021-12-30 DIAGNOSIS — M67813 Other specified disorders of tendon, right shoulder: Secondary | ICD-10-CM

## 2021-12-30 NOTE — Progress Notes (Signed)
Post-Op Visit Note   Patient: Meagan Flores           Date of Birth: 1966-07-29           MRN: [id number] Visit Date: 12/30/2021 PCP: Elsie Stain, MD   Assessment & Plan:  Chief Complaint:  Chief Complaint  Patient presents with   Right Shoulder - Routine Post Op   Visit Diagnoses:  1. Biceps tendonosis of right shoulder     Plan: Meagan Flores is 2-week status post right shoulder biceps tenodesis.  She is here for suture removal.  She reports some mild pain.  She has physical therapy scheduled to start next week.  Overall she feels like the pain is reasonable and tolerable.  Examination of the right shoulder shows a healed surgical incision.  Minimal swelling.  She is able to actively forward flex and abduct without much pain or guarding.  Meagan Flores feels that she has had improvement in the pain since we did the surgery and overall she is in good spirits.  We removed the sutures today.  She will do physical therapy for the next 6 weeks.  She is limited to a 2 pound lifting restriction for a total of 6 weeks after the surgery.  Recheck in 6 weeks.  Follow-Up Instructions: Return in about 6 weeks (around 02/10/2022).   Orders:  No orders of the defined types were placed in this encounter.  No orders of the defined types were placed in this encounter.   Imaging: No results found.  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   Tendinopathy of right biceps tendon 09/30/2020   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   Aneurysm ([place]) 03/18/2020   History of TIA (transient ischemic attack) 02/14/2020   History  of stroke    Mixed hyperlipidemia    Essential hypertension    Acute pain of left shoulder 09/08/2017   Impingement syndrome of right shoulder 12/24/2016   Tobacco use 09/28/2016   Glenohumeral arthritis 12/27/2014   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   Past Medical History:  Diagnosis Date   Acute pain of left shoulder 09/08/2017   Aneurysm ([place]) 03/18/2020   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   COVID-19 virus infection 02/14/2020   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) tear of right shoulder 09/30/2020   Essential hypertension    Glenohumeral arthritis 12/27/2014   History of stroke    History of TIA (transient ischemic attack) 02/14/2020   Hyperlipidemia    Hypothyroidism    Impingement syndrome of right shoulder 12/24/2016   Mixed hyperlipidemia    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   Prediabetes    Preop cardiovascular exam 11/25/2020   Pulmonary hypertension, unspecified ([place]) 11/25/2020   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   Rotator cuff impingement syndrome of right shoulder 09/30/2020   Skin rash 02/17/2021   Stroke [facility])    Tendinopathy of right biceps tendon 09/30/2020   Thyroid disease    TIA (transient ischemic attack) 04/15/2018   Tobacco use 09/28/2016    Family History  Problem Relation Age of Onset   Stomach cancer Sister    Colon polyps Sister    Esophageal cancer Neg Hx  Rectal cancer Neg Hx    Colon cancer Neg Hx    Breast cancer Neg Hx     Past Surgical History:  Procedure Laterality Date   BICEPT TENODESIS Right 12/16/2021   Procedure: RIGHT BICEPS TENODESIS;  Surgeon: Leandrew Koyanagi, MD;  Location: [place];  Service: Orthopedics;  Laterality: Right;   PARTIAL HYSTERECTOMY     right arm sx     SHOULDER ARTHROSCOPY WITH BICEPSTENOTOMY Right 01/21/2021   Procedure: SHOULDER ARTHROSCOPY WITH BICEPSTENOTOMY;  Surgeon: Leandrew Koyanagi, MD;  Location: [place];  Service: Orthopedics;   Laterality: Right;   SHOULDER ARTHROSCOPY WITH DISTAL CLAVICLE RESECTION Right 01/12/2017   Procedure: RIGHT SHOULDER ARTHROSCOPY WITH  SUBACROMIAL DECOMPRESSION, DISTAL CLAVICLE EXCISION, extensive debridement;  Surgeon: Leandrew Koyanagi, MD;  Location: [place];  Service: Orthopedics;  Laterality: Right;   THYMECTOMY  2004   THYROIDECTOMY     Social History   Occupational History   Not on file  Tobacco Use   Smoking status: Every Day    Packs/day: 0.50    Years: 34.00    Pack years: 17.00    Types: Cigarettes   Smokeless tobacco: Never  Vaping Use   Vaping Use: Never used  Substance and Sexual Activity   Alcohol use: Yes    Comment: occassional   Drug use: No   Sexual activity: Not on file

## 2022-01-01 ENCOUNTER — Other Ambulatory Visit: Payer: Self-pay | Admitting: Critical Care Medicine

## 2022-01-01 NOTE — Telephone Encounter (Signed)
Refilled 09/29/2021 #60 with 4 refills. Enough to last until 06/2022 Requested Prescriptions  Pending Prescriptions Disp Refills   valsartan (DIOVAN) 160 MG tablet [Pharmacy Med Name: Valsartan 160 MG Oral Tablet] 30 tablet 0    Sig: Take 1 tablet by mouth once daily     Cardiovascular:  Angiotensin Receptor Blockers Failed - 01/01/2022  2:25 AM      Failed - Cr in normal range and within 180 days    Creat  Date Value Ref Range Status  01/29/2014 0.69 0.50 - 1.10 mg/dL Final   Creatinine, Ser  Date Value Ref Range Status  11/04/2020 0.78 0.44 - 1.00 mg/dL Final         Failed - K in normal range and within 180 days    Potassium  Date Value Ref Range Status  11/04/2020 3.5 3.5 - 5.1 mmol/L Final         Passed - Patient is not pregnant      Passed - Last BP in normal range    BP Readings from Last 1 Encounters:  12/16/21 119/75         Passed - Valid encounter within last 6 months    Recent Outpatient Visits          3 months ago Essential hypertension   [place], [place]   6 months ago Essential hypertension   [place], MD   6 months ago Nausea   [place], [place]   8 months ago History of TIA (transient ischemic attack)   [place] Elsie Stain, MD   10 months ago Acute pain of left shoulder   [place], MD      Future Appointments            In 3 weeks Elsie Stain, MD [place]

## 2022-01-04 NOTE — BH Specialist Note (Signed)
Integrated Behavioral Health Follow Up In-Person Visit  MRN: [id number] Name: Meagan Flores  Number of [place] Clinician visits:  7 Session Start time: 8:35am  Session End time: 9:05am Total time: 30 minutes  Types of Service: Individual psychotherapy  Interpretor:No. Interpretor Name and Language: N/A  Subjective: Meagan Flores is a 56 y.o. female accompanied by  self Patient was referred by Asencion Noble, MD for depression and anxiety. Patient reports the following symptoms/concerns: Reports feeling depressed and anxious. Reports continued difficulty adjusting to not working anymore. Report feeling bored. Reports irritability with spouse due to him not wanting to do activities outside of the home. Reports that she also continues to experience pain in her shoulder. Duration of problem: 2 years; Severity of problem: mild  Objective: Mood: Depressed and Affect: Appropriate Risk of harm to self or others: No plan to harm self or others  Life Context: Family and Social: Reports her spouse and children as support system.  School/Work: Reports that she is unemployed due to having a shoulder injury. Reports that her spouse provides financially. Reports that she has applied for disability and has an attorney assisting her. Self-Care: Pt uses tobacco as coping skill. Life Changes: Reports that her parents passed away about two years ago and she has been trying to adjust. Reports that she has also had a shoulder injury that her caused her to be unable to work.   Patient and/or Family's Strengths/Protective Factors: Concrete supports in place (healthy food, safe environments, etc.)  Goals Addressed: Patient will:  Reduce symptoms of: anxiety and depression   Increase knowledge and/or ability of: coping skills   Demonstrate ability to: Increase healthy adjustment to current life circumstances and Begin healthy grieving over loss  Progress towards  Goals: Ongoing  Interventions: Interventions utilized:  Mindfulness or Psychologist, educational, CBT Cognitive Behavioral Therapy, Supportive Counseling, and Communication Skills Standardized Assessments completed: Not Needed  Patient and/or Family Response: Pt receptive to tx. Pt receptive to cognitive restructuring. Pt receptive to increasing communication with spouse. Pt will identify enjoyable activities.   Patient Centered Plan: Patient is on the following Treatment Plan(s): Depression, anxiety, and grief  Assessment: Denies SI/HI. Patient currently experiencing depression associated with difficulty adjusting to current health complications and grief. Pt appears to frequently sit at home. Pt has difficulty communicating her concerns to her spouse.   Patient may benefit from increasing communication skills. LCSWA utilized Adult nurse. LCSWA provided assistance with communication skills. LCSWA encouraged pt to utilize deep breathing and identify enjoyable activities that can be done while by yourself.  Plan: Follow up with behavioral health clinician on : 01/27/22 Behavioral recommendations: Utilize deep breathing, increase communication with spouse, and identify enjoyable activities. Referral(s): [place] (In Clinic) "From scale of 1-10, how likely are you to follow plan?": 10  Lamia Mariner C Siddh Vandeventer, LCSW

## 2022-01-05 NOTE — Therapy (Signed)
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   COVID-19 virus infection 02/14/2020   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) tear of right shoulder 09/30/2020   Essential hypertension    Glenohumeral arthritis 12/27/2014   History of stroke    History of TIA (transient ischemic attack) 02/14/2020   Hyperlipidemia    Hypothyroidism    Impingement syndrome of right shoulder 12/24/2016   Mixed hyperlipidemia    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   Prediabetes    Preop cardiovascular exam 11/25/2020   Pulmonary hypertension, unspecified ([place]) 11/25/2020   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   Rotator cuff impingement syndrome of right shoulder 09/30/2020   Skin rash 02/17/2021   Stroke [facility])    Tendinopathy of right biceps tendon 09/30/2020   Thyroid disease    TIA (transient ischemic attack) 04/15/2018   Tobacco use 09/28/2016   Past Surgical History:  Procedure Laterality Date   BICEPT TENODESIS Right 12/16/2021   Procedure: RIGHT BICEPS TENODESIS;  Surgeon: Leandrew Koyanagi, MD;  Location: [place];  Service: Orthopedics;  Laterality: Right;   PARTIAL HYSTERECTOMY     right arm sx     SHOULDER ARTHROSCOPY WITH BICEPSTENOTOMY Right 01/21/2021   Procedure: SHOULDER ARTHROSCOPY WITH BICEPSTENOTOMY;  Surgeon: Leandrew Koyanagi, MD;  Location: [place];  Service: Orthopedics;  Laterality: Right;   SHOULDER ARTHROSCOPY WITH DISTAL CLAVICLE  RESECTION Right 01/12/2017   Procedure: RIGHT SHOULDER ARTHROSCOPY WITH  SUBACROMIAL DECOMPRESSION, DISTAL CLAVICLE EXCISION, extensive debridement;  Surgeon: Leandrew Koyanagi, MD;  Location: [place];  Service: Orthopedics;  Laterality: Right;   THYMECTOMY  2004   THYROIDECTOMY     Patient Active Problem List   Diagnosis Date Noted   Skin rash 02/17/2021   Preop cardiovascular exam 11/25/2020   Pulmonary hypertension, unspecified (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   Tendinopathy of right biceps tendon 09/30/2020   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   Aneurysm ([place]) 03/18/2020   History of TIA (transient ischemic attack) 02/14/2020   History of stroke    Mixed hyperlipidemia    Essential hypertension    Acute pain of left shoulder 09/08/2017   Impingement syndrome of right shoulder 12/24/2016   Tobacco use 09/28/2016   Glenohumeral arthritis 12/27/2014   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    PCP: Elsie Stain, MD  REFERRING PROVIDER: Aundra Dubin, PA-C  REFERRING DIAG: Referral diagnosis: Biceps tendonosis of right shoulder [M67.813]  THERAPY DIAG:  Chronic right shoulder pain  Stiffness of right shoulder, not elsewhere classified  Muscle weakness (generalized)  Localized edema   ONSET DATE: 1/18 R biceps tenodesis  SUBJECTIVE:  Meagan Flores is a 56 y.o. female who presents to clinic with chief complaint of R shoulder pain.  MOI/History of condition:  Chronic R shoulder pain with history of multiple surgeries consisting of debridement  of R R/C and labrum as well as SAD.  Most recently underwent R biceps tenodesis on 1/18.  From referring provider:   "Plan: Meagan Flores is 2-week status post right shoulder biceps tenodesis.  She is here for suture removal.  She reports some mild pain.  She has physical therapy scheduled to start next week.  Overall she feels like the pain is reasonable and tolerable.   Examination of the right shoulder shows a healed surgical incision.  Minimal swelling.  She is able to actively forward flex and abduct without much pain or guarding.   Aaradhya feels that she has had improvement in the pain since we did the surgery and overall she is in good spirits.  We removed the sutures today.  She will do physical therapy for the next 6 weeks.  She is limited to a 2 pound lifting restriction for a total of 6 weeks after the surgery.  Recheck in 6 weeks."   Red flags:  denies   Pertinent past history:  Hx of debridement and sub acromial decompression x2 (2018 and 2022). Hx of stroke, prediabetes  Pain:  Are you having pain? Yes Pain location: R shoulder NPRS scale:  highest 10/10 current 7/10  best 7/10 Aggravating factors: shoulder movement Relieving factors: rest, ice Pain description: intermittent, sharp, and aching Severity: high Irritability: low Stage: Chronic Stability: staying the same 24 hour pattern: worst at night   Occupation: lost job d/t R shoulder pain  Hobbies/Recreation: NA  Assistive Device: na  Hand Dominance: R  Patient Goals: reduce pain be able to use R shoulder   PRECAUTIONS: Shoulder biceps tenodesis 1/18  WEIGHT BEARING RESTRICTIONS Yes see precautions  FALLS:  Has patient fallen in last 6 months? No, Number of falls: 0  LIVING ENVIRONMENT: Lives with: lives with their family  PLOF: Independent  DIAGNOSTIC FINDINGS:  NA    OBJECTIVE:   GENERAL OBSERVATION:  Pt presents to clinic with R shoulder sling     SENSATION:  Light touch: Appears  intact   PALPATION: Incision appears well healing with no sign of infection  UPPER EXTREMITY AROM:  ROM Right 01/06/2022 Left 01/06/2022  Shoulder flexion  140  Shoulder abduction    Shoulder internal rotation    Shoulder external rotation    Functional IR    Functional ER    Shoulder extension    Elbow extension    Elbow flexion     (Blank rows = not tested, N = WNL, * = concordant pain with testing)  UPPER EXTREMITY MMT:  MMT Right 01/06/2022 Left 01/06/2022  Shoulder flexion    Shoulder abduction (C5)    Shoulder ER    Shoulder IR    Middle trapezius    Lower trapezius    Shoulder extension    Grip strength    Cervical flexion (C1,C2)    Cervical S/B (C3)    Shoulder shrug (C4)    Elbow flexion (C6)    Elbow ext (C7)    Thumb ext (C8)    Finger abd (T1)    Grossly     (Blank rows = not tested, score listed is out of 5 possible points.  N = WNL, D = diminished, C = clear for gross weakness with myotome testing, * = concordant  pain with testing)     UPPER EXTREMITY PROM:  PROM Right 01/06/2022 Left 01/06/2022  Shoulder flexion 95*   Shoulder abduction    Shoulder internal rotation    Shoulder external rotation >40 degrees*   Functional IR    Functional ER    Shoulder extension    Elbow extension Full Full  Elbow flexion     (Blank rows = not tested, N = WNL, * = concordant pain with testing)   PATIENT SURVEYS:  Quick Dash 86.4% disability    TODAY'S TREATMENT:  Access Code: [email or web address] URL: [email or web address] Date: 01/06/2022 Prepared by: Shearon Balo  Exercises Forearm Strengthening with Greig Right - 3 x daily - 7 x weekly - 3 sets - 10 reps Seated Shoulder Flexion Towel Slide at Table Top Full Range of Motion - 2 x daily - 7 x weekly - 3 sets - 10 reps    PATIENT EDUCATION:  POC, diagnosis, prognosis, HEP, and outcome measures.  Pt educated via explanation, demonstration, and handout (HEP).  Pt confirms understanding  verbally.    HOME EXERCISE PROGRAM: Access Code: [email or web address] URL: [email or web address] Date: 01/06/2022 Prepared by: Shearon Balo  Exercises Forearm Strengthening with Greig Right - 3 x daily - 7 x weekly - 3 sets - 10 reps Seated Shoulder Flexion Towel Slide at Table Top Full Range of Motion - 2 x daily - 7 x weekly - 3 sets - 10 reps   ASSESSMENT:  CLINICAL IMPRESSION: Meagan Flores is a 56 y.o. R hand dominant female who presents to clinic with signs and sxs consistent with chronic R shoulder pain secondary to multiple R shoulder surgeries, most recently biceps tenodesis on 1/18.  Patient presents with pain and impairments/deficits in: shoulder strength and ROM.  Activity limitations include: reaching OH, lifting.  Participation limitations include: ADLs involving reaching, lifting, and use of R arm; work.  Patient will benefit from skilled therapy to address pain and the listed deficits in order to achieve functional goals, enable safety and independence in completion of daily tasks, and return to PLOF.   REHAB POTENTIAL: Fair chronic  CLINICAL DECISION MAKING: Stable/uncomplicated  EVALUATION COMPLEXITY: Low   GOALS:  SHORT TERM GOALS:  STG Name Target Date Goal status  1 Meagan Flores will be >75% HEP compliant to improve carryover between sessions and facilitate independent management of condition  Baseline: No HEP 01/27/2022 INITIAL   LONG TERM GOALS:   LTG Name Target Date Goal status  1 Meagan Flores will show a >/= 30 pt improvement in their QUICK DASH score (MCID is 10 pts) as a proxy for functional improvement   Baseline: 86.4 03/03/2022 INITIAL  2 Meagan Flores will demonstrate >120 degrees of active ROM in flexion to allow completion of activities involving reaching OH, not limited by pain  Baseline: 95 degrees passive  03/03/2022 INITIAL  3 Meagan Flores will be able to lift a 2# weight 2 second shelf x10, not limited by pain  Baseline: limited 03/03/2022 INITIAL  4 Meagan Flores will report >/=  50% decrease in pain from evaluation   Baseline: 10/10 max pain, 7/10 average 03/03/2022 INITIAL   PLAN: PT FREQUENCY: 1-2x/week  PT DURATION: 8 weeks (Ending 03/03/2022)  PLANNED INTERVENTIONS: Therapeutic exercises, Therapeutic activity, Neuro Muscular re-education, Gait training, Patient/Family education, Joint mobilization, Dry Needling, Electrical stimulation, Spinal mobilization and/or manipulation, Moist heat, Taping, Vasopneumatic device, Ionotophoresis 4mg /ml Dexamethasone, and Manual therapy  PLAN FOR NEXT SESSION: slowly work on shoulder PROM with progressive strengthening as appropriate   Shearon Balo PT, DPT 01/06/2022, 1:47  PM

## 2022-01-06 ENCOUNTER — Encounter: Payer: Self-pay | Admitting: Physical Therapy

## 2022-01-06 ENCOUNTER — Other Ambulatory Visit: Payer: Self-pay

## 2022-01-06 ENCOUNTER — Ambulatory Visit: Payer: Self-pay | Attending: Physician Assistant | Admitting: Physical Therapy

## 2022-01-06 DIAGNOSIS — M25511 Pain in right shoulder: Secondary | ICD-10-CM | POA: Insufficient documentation

## 2022-01-06 DIAGNOSIS — M25611 Stiffness of right shoulder, not elsewhere classified: Secondary | ICD-10-CM

## 2022-01-06 DIAGNOSIS — G8929 Other chronic pain: Secondary | ICD-10-CM

## 2022-01-06 DIAGNOSIS — M67813 Other specified disorders of tendon, right shoulder: Secondary | ICD-10-CM | POA: Insufficient documentation

## 2022-01-06 DIAGNOSIS — R600 Localized edema: Secondary | ICD-10-CM

## 2022-01-06 DIAGNOSIS — M6281 Muscle weakness (generalized): Secondary | ICD-10-CM

## 2022-01-15 ENCOUNTER — Other Ambulatory Visit: Payer: Self-pay

## 2022-01-15 ENCOUNTER — Ambulatory Visit: Payer: Self-pay | Admitting: Physical Therapy

## 2022-01-15 ENCOUNTER — Encounter: Payer: Self-pay | Admitting: Physical Therapy

## 2022-01-15 DIAGNOSIS — M25611 Stiffness of right shoulder, not elsewhere classified: Secondary | ICD-10-CM

## 2022-01-15 DIAGNOSIS — M6281 Muscle weakness (generalized): Secondary | ICD-10-CM

## 2022-01-15 DIAGNOSIS — R600 Localized edema: Secondary | ICD-10-CM

## 2022-01-15 DIAGNOSIS — G8929 Other chronic pain: Secondary | ICD-10-CM

## 2022-01-15 NOTE — Therapy (Signed)
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   COVID-19 virus infection 02/14/2020   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) tear of right shoulder 09/30/2020   Essential hypertension    Glenohumeral arthritis 12/27/2014   History of stroke    History of TIA (transient ischemic attack) 02/14/2020   Hyperlipidemia    Hypothyroidism    Impingement syndrome of right shoulder 12/24/2016   Mixed hyperlipidemia    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   Prediabetes    Preop cardiovascular exam 11/25/2020   Pulmonary hypertension, unspecified ([place]) 11/25/2020   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   Rotator cuff impingement syndrome of right shoulder 09/30/2020   Skin rash 02/17/2021   Stroke [facility])    Tendinopathy of right biceps tendon 09/30/2020   Thyroid disease    TIA (transient ischemic attack) 04/15/2018   Tobacco use 09/28/2016   Past Surgical History:  Procedure Laterality Date   BICEPT TENODESIS Right 12/16/2021   Procedure: RIGHT BICEPS TENODESIS;  Surgeon: Leandrew Koyanagi, MD;  Location: [place];  Service: Orthopedics;  Laterality: Right;   PARTIAL HYSTERECTOMY     right arm sx     SHOULDER ARTHROSCOPY WITH BICEPSTENOTOMY Right 01/21/2021   Procedure: SHOULDER ARTHROSCOPY WITH BICEPSTENOTOMY;  Surgeon: Leandrew Koyanagi, MD;  Location: [place];  Service:  Orthopedics;  Laterality: Right;   SHOULDER ARTHROSCOPY WITH DISTAL CLAVICLE RESECTION Right 01/12/2017   Procedure: RIGHT SHOULDER ARTHROSCOPY WITH  SUBACROMIAL DECOMPRESSION, DISTAL CLAVICLE EXCISION, extensive debridement;  Surgeon: Leandrew Koyanagi, MD;  Location: [place];  Service: Orthopedics;  Laterality: Right;   THYMECTOMY  2004   THYROIDECTOMY     Patient Active Problem List   Diagnosis Date Noted   Skin rash 02/17/2021   Preop cardiovascular exam 11/25/2020   Pulmonary hypertension, unspecified ([place]) 11/25/2020   Prediabetes    Hyperlipidemia    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) tear of right shoulder 09/30/2020   Rotator cuff impingement syndrome of right shoulder 09/30/2020   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   Tendinopathy of right biceps tendon 09/30/2020   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   Aneurysm ([place]) 03/18/2020   History of TIA (transient ischemic attack) 02/14/2020   History of stroke    Mixed hyperlipidemia    Essential hypertension    Acute pain of left shoulder 09/08/2017   Impingement syndrome of right shoulder 12/24/2016   Tobacco use 09/28/2016   Glenohumeral arthritis 12/27/2014   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    REFERRING DIAG: Referral diagnosis: Biceps tendonosis of right shoulder [M67.813]  THERAPY DIAG:  Chronic right shoulder pain  Stiffness of right shoulder, not elsewhere classified  Muscle weakness (generalized)  Localized edema  PERTINENT HISTORY: Hx of debridement and sub acromial decompression x2 (2018 and 2022). Hx  of stroke, prediabetes  PRECAUTIONS/RESTRICTIONS:   PRECAUTIONS: Shoulder biceps tenodesis 1/18   WEIGHT BEARING RESTRICTIONS Yes see precautions  SUBJECTIVE:  Pt reports that she has been working on her exercises at home.  She has had a high level of pain.  Pain:  Are you having pain? Yes Pain location: R shoulder NPRS scale: current 8/10   Aggravating factors: shoulder movement Relieving factors: rest, ice Pain description: intermittent, sharp, and aching Severity: high Irritability: low Stage: Chronic Stability: staying the same 24 hour pattern: worst at night   OBJECTIVE:  UPPER EXTREMITY AROM:   ROM Right 01/06/2022 Left 01/06/2022  Shoulder flexion   140  Shoulder abduction      Shoulder internal rotation      Shoulder external rotation      Functional IR      Functional ER      Shoulder extension      Elbow extension      Elbow flexion        (Blank rows = not tested, N = WNL, * = concordant pain with testing)   UPPER EXTREMITY MMT:   MMT Right 01/06/2022 Left 01/06/2022  Shoulder flexion      Shoulder abduction (C5)      Shoulder ER      Shoulder IR      Middle trapezius      Lower trapezius      Shoulder extension      Grip strength      Cervical flexion (C1,C2)      Cervical S/B (C3)      Shoulder shrug (C4)      Elbow flexion (C6)      Elbow ext (C7)      Thumb ext (C8)      Finger abd (T1)      Grossly        (Blank rows = not tested, score listed is out of 5 possible points.  N = WNL, D = diminished, C = clear for gross weakness with myotome testing, * = concordant pain with testing)                UPPER EXTREMITY PROM:   PROM Right 01/06/2022 Left 01/06/2022  Shoulder flexion 95*    Shoulder abduction      Shoulder internal rotation      Shoulder external rotation >40 degrees*    Functional IR      Functional ER      Shoulder extension      Elbow extension Full Full  Elbow flexion        (Blank rows = not tested, N = WNL, * = concordant pain with testing)   ASTERISK SIGNS   Asterisk Signs 2/17       Shoulder flexion PROM 100       Shoulder flexion AROM                                 TREATMENT 01/15/2022:  Therapeutic Exercise: - scapular retraction - ball squeeze - elbow flex ext - 20x2 - wrist pronation/supination - 20x - AAROM ER in supine (not beyond 40  degrees) - Pulley 2' gentle for FF  Manual Therapy: - forward elevation PROM with gentle oscillations at end range, pt in supine  Modalities:  Vasopneumatic (Game Ready)    Location:  right shoulder Time:  10 minutes Pressure:  low Temperature:  46 degrees  HOME EXERCISE PROGRAM: Access Code: [email or web address] URL: [email or web address] Date: 01/15/2022 Prepared by: Shearon Balo  Exercises Forearm Strengthening with Greig Right - 3 x daily - 7 x weekly - 3 sets - 10 reps Seated Shoulder Flexion Towel Slide at Table Top Full Range of Motion - 2 x daily - 7 x weekly - 3 sets - 10 reps Seated Elbow Flexion and Extension AROM - 1 x daily - 7 x weekly - 3 sets - 10 reps Seated Forearm Pronation and Supination AROM - 1 x daily - 7 x weekly - 3 sets - 10 reps    ASSESSMENT:   CLINICAL IMPRESSION: Margi is progressing well with therapy.  Pt reports no increase in baseline pain following therapy.  Today we concentrated on shoulder range of motion and elbow ROM .  Pt with high levels of pain which limits AAROM flexion.  Concentration remained on PROM for now; FF improved today.  Pt will continue to benefit from skilled physical therapy to address remaining deficits and achieve listed goals.  Continue per POC.     Problem list: Patient presents with pain and impairments/deficits in: shoulder strength and ROM.  Activity limitations include: reaching OH, lifting.  Participation limitations include: ADLs involving reaching, lifting, and use of R arm; work.      GOALS:   SHORT TERM GOALS:   STG Name Target Date Goal status  1 Camreigh will be >75% HEP compliant to improve carryover between sessions and facilitate independent management of condition   Baseline: No HEP 01/27/2022 INITIAL    LONG TERM GOALS:    LTG Name Target Date Goal status  1 Ashani will show a >/= 30 pt improvement in their QUICK DASH score (MCID is 10 pts) as a proxy for functional improvement    Baseline:  86.4 03/03/2022 INITIAL  2 Theodore will demonstrate >120 degrees of active ROM in flexion to allow completion of activities involving reaching OH, not limited by pain   Baseline: 95 degrees passive   03/03/2022 INITIAL  3 Ashlynne will be able to lift a 2# weight to second shelf x10, not limited by pain   Baseline: limited 03/03/2022 INITIAL  4 Chrishana will report >/= 50% decrease in pain from evaluation    Baseline: 10/10 max pain, 7/10 average 03/03/2022 INITIAL    PLAN: PT FREQUENCY: 1-2x/week   PT DURATION: 8 weeks (Ending 03/03/2022)   PLANNED INTERVENTIONS: Therapeutic exercises, Therapeutic activity, Neuro Muscular re-education, Gait training, Patient/Family education, Joint mobilization, Dry Needling, Electrical stimulation, Spinal mobilization and/or manipulation, Moist heat, Taping, Vasopneumatic device, Ionotophoresis 4mg /ml Dexamethasone, and Manual therapy   PLAN FOR NEXT SESSION: slowly work on shoulder PROM with progressive strengthening as appropriate   Kevan Ny Trevell Pariseau PT 01/15/2022, 9:49 AM

## 2022-01-19 ENCOUNTER — Ambulatory Visit: Payer: Self-pay | Admitting: Physical Therapy

## 2022-01-19 ENCOUNTER — Encounter: Payer: Self-pay | Admitting: Physical Therapy

## 2022-01-19 ENCOUNTER — Other Ambulatory Visit: Payer: Self-pay

## 2022-01-19 DIAGNOSIS — R600 Localized edema: Secondary | ICD-10-CM

## 2022-01-19 DIAGNOSIS — M6281 Muscle weakness (generalized): Secondary | ICD-10-CM

## 2022-01-19 DIAGNOSIS — M25611 Stiffness of right shoulder, not elsewhere classified: Secondary | ICD-10-CM

## 2022-01-19 DIAGNOSIS — G8929 Other chronic pain: Secondary | ICD-10-CM

## 2022-01-19 DIAGNOSIS — M25511 Pain in right shoulder: Secondary | ICD-10-CM

## 2022-01-19 NOTE — Therapy (Signed)
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   COVID-19 virus infection 02/14/2020   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) tear of right shoulder 09/30/2020   Essential hypertension    Glenohumeral arthritis 12/27/2014   History of stroke    History of TIA (transient ischemic attack) 02/14/2020   Hyperlipidemia    Hypothyroidism    Impingement syndrome of right shoulder 12/24/2016   Mixed hyperlipidemia    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   Prediabetes    Preop cardiovascular exam 11/25/2020   Pulmonary hypertension, unspecified ([place]) 11/25/2020   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   Rotator cuff impingement syndrome of right shoulder 09/30/2020   Skin rash 02/17/2021   Stroke [facility])    Tendinopathy of right biceps tendon 09/30/2020   Thyroid disease    TIA (transient ischemic attack) 04/15/2018   Tobacco use 09/28/2016   Past Surgical History:  Procedure Laterality Date   BICEPT TENODESIS Right 12/16/2021   Procedure: RIGHT BICEPS TENODESIS;  Surgeon: Leandrew Koyanagi, MD;  Location: [place];  Service: Orthopedics;  Laterality: Right;   PARTIAL HYSTERECTOMY     right arm sx     SHOULDER ARTHROSCOPY WITH BICEPSTENOTOMY Right 01/21/2021   Procedure: SHOULDER ARTHROSCOPY WITH BICEPSTENOTOMY;  Surgeon: Leandrew Koyanagi, MD;  Location: [place];  Service:  Orthopedics;  Laterality: Right;   SHOULDER ARTHROSCOPY WITH DISTAL CLAVICLE RESECTION Right 01/12/2017   Procedure: RIGHT SHOULDER ARTHROSCOPY WITH  SUBACROMIAL DECOMPRESSION, DISTAL CLAVICLE EXCISION, extensive debridement;  Surgeon: Leandrew Koyanagi, MD;  Location: [place];  Service: Orthopedics;  Laterality: Right;   THYMECTOMY  2004   THYROIDECTOMY     Patient Active Problem List   Diagnosis Date Noted   Skin rash 02/17/2021   Preop cardiovascular exam 11/25/2020   Pulmonary hypertension, unspecified ([place]) 11/25/2020   Prediabetes    Hyperlipidemia    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) tear of right shoulder 09/30/2020   Rotator cuff impingement syndrome of right shoulder 09/30/2020   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   Tendinopathy of right biceps tendon 09/30/2020   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   Aneurysm ([place]) 03/18/2020   History of TIA (transient ischemic attack) 02/14/2020   History of stroke    Mixed hyperlipidemia    Essential hypertension    Acute pain of left shoulder 09/08/2017   Impingement syndrome of right shoulder 12/24/2016   Tobacco use 09/28/2016   Glenohumeral arthritis 12/27/2014   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    REFERRING DIAG: Referral diagnosis: Biceps tendonosis of right shoulder [M67.813]  THERAPY DIAG:  Chronic right shoulder pain  Stiffness of right shoulder, not elsewhere classified  Muscle weakness (generalized)  Localized edema  PERTINENT HISTORY: Hx of debridement and sub acromial decompression x2 (2018 and 2022). Hx  of stroke, prediabetes  PRECAUTIONS/RESTRICTIONS:   PRECAUTIONS: Shoulder biceps tenodesis 1/18   WEIGHT BEARING RESTRICTIONS Yes see precautions  SUBJECTIVE:  Pt reports that her shoulder was sore after last session "I could barely lift it up".  She feels it is somewhat improved today.  Pain:  Are you having pain? Yes Pain location: R  shoulder NPRS scale: current 8/10  Aggravating factors: shoulder movement Relieving factors: rest, ice Pain description: intermittent, sharp, and aching Severity: high Irritability: low Stage: Chronic Stability: staying the same 24 hour pattern: worst at night   OBJECTIVE:  UPPER EXTREMITY AROM:   ROM Right 01/06/2022 Left 01/06/2022  Shoulder flexion   140  Shoulder abduction      Shoulder internal rotation      Shoulder external rotation      Functional IR      Functional ER      Shoulder extension      Elbow extension      Elbow flexion        (Blank rows = not tested, N = WNL, * = concordant pain with testing)   UPPER EXTREMITY MMT:   MMT Right 01/06/2022 Left 01/06/2022  Shoulder flexion      Shoulder abduction (C5)      Shoulder ER      Shoulder IR      Middle trapezius      Lower trapezius      Shoulder extension      Grip strength      Cervical flexion (C1,C2)      Cervical S/B (C3)      Shoulder shrug (C4)      Elbow flexion (C6)      Elbow ext (C7)      Thumb ext (C8)      Finger abd (T1)      Grossly        (Blank rows = not tested, score listed is out of 5 possible points.  N = WNL, D = diminished, C = clear for gross weakness with myotome testing, * = concordant pain with testing)                UPPER EXTREMITY PROM:   PROM Right 01/06/2022 Left 01/06/2022  Shoulder flexion 95*    Shoulder abduction      Shoulder internal rotation      Shoulder external rotation >40 degrees*    Functional IR      Functional ER      Shoulder extension      Elbow extension Full Full  Elbow flexion        (Blank rows = not tested, N = WNL, * = concordant pain with testing)   ASTERISK SIGNS   Asterisk Signs 2/17 2/17      Shoulder flexion PROM 100 100 (held d/t pain)      Shoulder flexion AROM                                  TREATMENT 01/19/2022:  Therapeutic Exercise: - scapular retraction - 20'' - ball squeeze - 2x20 3-4'' - elbow flex ext -  20x2 - wrist pronation/supination - 20x2 - AAROM ER in supine (not beyond 40 degrees) - AAROM chest press in supine with dowel - x20 (partial ROM) - ER isometrics - 10x (supine_  Manual Therapy: - forward elevation PROM with gentle oscillations at end range, pt in supine  Modalities:  Vasopneumatic (Game Ready)    Location:  right shoulder Time:  10 minutes Pressure:  low Temperature:  46 degrees   TREATMENT 01/15/2022:  Therapeutic Exercise: - scapular retraction - ball squeeze - elbow flex ext - 20x2 - wrist pronation/supination - 20x - AAROM ER in supine (not beyond 40 degrees) - Pulley 2' gentle for FF  Manual Therapy: - forward elevation PROM with gentle oscillations at end range, pt in supine  Modalities:  Vasopneumatic (Game Ready)    Location:  right shoulder Time:  10 minutes Pressure:  low Temperature:  46 degrees   HOME EXERCISE PROGRAM: Access Code: [email or web address] URL: [email or web address] Date: 01/15/2022 Prepared by: Shearon Balo  Exercises Forearm Strengthening with Greig Right - 3 x daily - 7 x weekly - 3 sets - 10 reps Seated Shoulder Flexion Towel Slide at Table Top Full Range of Motion - 2 x daily - 7 x weekly - 3 sets - 10 reps Seated Elbow Flexion and Extension AROM - 1 x daily - 7 x weekly - 3 sets - 10 reps Seated Forearm Pronation and Supination AROM - 1 x daily - 7 x weekly - 3 sets - 10 reps    ASSESSMENT:   CLINICAL IMPRESSION: Sallee is progressing fair with therapy.  Pt reports no increase in baseline pain following therapy.  Today we concentrated on shoulder range of motion.  Pt with continued high levels of pain.  Shoulder ROM is limited by pain.  She is has high level of guarding with PROM but we were able to get back to 100 degrees today.  Pt will continue to benefit from skilled physical therapy to address remaining deficits and achieve listed goals.  Continue per POC.     Problem list: Patient presents with  pain and impairments/deficits in: shoulder strength and ROM.  Activity limitations include: reaching OH, lifting.  Participation limitations include: ADLs involving reaching, lifting, and use of R arm; work.      GOALS:   SHORT TERM GOALS:   STG Name Target Date Goal status  1 Izadora will be >75% HEP compliant to improve carryover between sessions and facilitate independent management of condition   Baseline: No HEP 01/27/2022 MET 2/21    LONG TERM GOALS:    LTG Name Target Date Goal status  1 Ismael will show a >/= 30 pt improvement in their QUICK DASH score (MCID is 10 pts) as a proxy for functional improvement    Baseline: 86.4 03/03/2022 INITIAL  2 Jilian will demonstrate >120 degrees of active ROM in flexion to allow completion of activities involving reaching [place], not limited by pain   Baseline: 95 degrees passive   03/03/2022 INITIAL  3 Ayonna will be able to lift a 2# weight to second shelf x10, not limited by pain   Baseline: limited 03/03/2022 INITIAL  4 Linsie will report >/= 50% decrease in pain from evaluation    Baseline: 10/10 max pain, 7/10 average 03/03/2022 INITIAL    PLAN: PT FREQUENCY: 1-2x/week   PT DURATION: 8 weeks (Ending 03/03/2022)   PLANNED INTERVENTIONS: Therapeutic exercises, Therapeutic activity, Neuro Muscular re-education, Gait training, Patient/Family education, Joint mobilization, Dry Needling, Electrical stimulation, Spinal mobilization and/or manipulation, Moist heat, Taping, Vasopneumatic device, Ionotophoresis 4mg /ml Dexamethasone, and Manual therapy   PLAN FOR NEXT SESSION: slowly work on shoulder PROM with progressive strengthening as appropriate   Kevan Ny Taquan Bralley PT 01/19/2022, 9:55 AM

## 2022-01-26 ENCOUNTER — Other Ambulatory Visit: Payer: Self-pay

## 2022-01-26 ENCOUNTER — Encounter: Payer: Self-pay | Admitting: Physical Therapy

## 2022-01-26 ENCOUNTER — Ambulatory Visit: Payer: Self-pay | Admitting: Physical Therapy

## 2022-01-26 DIAGNOSIS — M25511 Pain in right shoulder: Secondary | ICD-10-CM

## 2022-01-26 DIAGNOSIS — R600 Localized edema: Secondary | ICD-10-CM

## 2022-01-26 DIAGNOSIS — G8929 Other chronic pain: Secondary | ICD-10-CM

## 2022-01-26 DIAGNOSIS — M6281 Muscle weakness (generalized): Secondary | ICD-10-CM

## 2022-01-26 DIAGNOSIS — M25611 Stiffness of right shoulder, not elsewhere classified: Secondary | ICD-10-CM

## 2022-01-26 NOTE — Therapy (Signed)
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   COVID-19 virus infection 02/14/2020   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) tear of right shoulder 09/30/2020   Essential hypertension    Glenohumeral arthritis 12/27/2014   History of stroke    History of TIA (transient ischemic attack) 02/14/2020   Hyperlipidemia    Hypothyroidism    Impingement syndrome of right shoulder 12/24/2016   Mixed hyperlipidemia    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   Prediabetes    Preop cardiovascular exam 11/25/2020   Pulmonary hypertension, unspecified ([place]) 11/25/2020   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   Rotator cuff impingement syndrome of right shoulder 09/30/2020   Skin rash 02/17/2021   Stroke [facility])    Tendinopathy of right biceps tendon 09/30/2020   Thyroid disease    TIA (transient ischemic attack) 04/15/2018   Tobacco use 09/28/2016   Past Surgical History:  Procedure Laterality Date   BICEPT TENODESIS Right 12/16/2021   Procedure: RIGHT BICEPS TENODESIS;  Surgeon: Leandrew Koyanagi, MD;  Location: [place];  Service: Orthopedics;  Laterality: Right;   PARTIAL HYSTERECTOMY     right arm sx     SHOULDER ARTHROSCOPY WITH BICEPSTENOTOMY Right 01/21/2021   Procedure: SHOULDER ARTHROSCOPY WITH BICEPSTENOTOMY;  Surgeon: Leandrew Koyanagi, MD;  Location: [place];  Service:  Orthopedics;  Laterality: Right;   SHOULDER ARTHROSCOPY WITH DISTAL CLAVICLE RESECTION Right 01/12/2017   Procedure: RIGHT SHOULDER ARTHROSCOPY WITH  SUBACROMIAL DECOMPRESSION, DISTAL CLAVICLE EXCISION, extensive debridement;  Surgeon: Leandrew Koyanagi, MD;  Location: [place];  Service: Orthopedics;  Laterality: Right;   THYMECTOMY  2004   THYROIDECTOMY     Patient Active Problem List   Diagnosis Date Noted   Skin rash 02/17/2021   Preop cardiovascular exam 11/25/2020   Pulmonary hypertension, unspecified ([place]) 11/25/2020   Prediabetes    Hyperlipidemia    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) tear of right shoulder 09/30/2020   Rotator cuff impingement syndrome of right shoulder 09/30/2020   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   Tendinopathy of right biceps tendon 09/30/2020   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   Aneurysm ([place]) 03/18/2020   History of TIA (transient ischemic attack) 02/14/2020   History of stroke    Mixed hyperlipidemia    Essential hypertension    Acute pain of left shoulder 09/08/2017   Impingement syndrome of right shoulder 12/24/2016   Tobacco use 09/28/2016   Glenohumeral arthritis 12/27/2014   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    REFERRING DIAG: Referral diagnosis: Biceps tendonosis of right shoulder [M67.813]  THERAPY DIAG:  Chronic right shoulder pain  Stiffness of right shoulder, not elsewhere classified  Muscle weakness (generalized)  Localized edema  PERTINENT HISTORY: Hx of debridement and sub acromial decompression x2 (2018 and 2022). Hx  of stroke, prediabetes  PRECAUTIONS/RESTRICTIONS:   PRECAUTIONS: Shoulder biceps tenodesis 1/18   WEIGHT BEARING RESTRICTIONS Yes see precautions  SUBJECTIVE:  Pt reports that her shoulder is sore after therapy, but that she is feeling better today.  Pain:  Are you having pain? Yes Pain location: R shoulder NPRS scale: current 7/10  Aggravating  factors: shoulder movement Relieving factors: rest, ice Pain description: intermittent, sharp, and aching Severity: high Irritability: low Stage: Chronic Stability: staying the same 24 hour pattern: worst at night   OBJECTIVE:  UPPER EXTREMITY AROM:   ROM Right 01/06/2022 Left 01/06/2022  Shoulder flexion   140  Shoulder abduction      Shoulder internal rotation      Shoulder external rotation      Functional IR      Functional ER      Shoulder extension      Elbow extension      Elbow flexion        (Blank rows = not tested, N = WNL, * = concordant pain with testing)   UPPER EXTREMITY MMT:   MMT Right 01/06/2022 Left 01/06/2022  Shoulder flexion      Shoulder abduction (C5)      Shoulder ER      Shoulder IR      Middle trapezius      Lower trapezius      Shoulder extension      Grip strength      Cervical flexion (C1,C2)      Cervical S/B (C3)      Shoulder shrug (C4)      Elbow flexion (C6)      Elbow ext (C7)      Thumb ext (C8)      Finger abd (T1)      Grossly        (Blank rows = not tested, score listed is out of 5 possible points.  N = WNL, D = diminished, C = clear for gross weakness with myotome testing, * = concordant pain with testing)                UPPER EXTREMITY PROM:   PROM Right 01/06/2022 Left 01/06/2022  Shoulder flexion 95*    Shoulder abduction      Shoulder internal rotation      Shoulder external rotation >40 degrees*    Functional IR      Functional ER      Shoulder extension      Elbow extension Full Full  Elbow flexion        (Blank rows = not tested, N = WNL, * = concordant pain with testing)   ASTERISK SIGNS   Asterisk Signs 2/17 2/17 2/28     Shoulder flexion PROM 100 100 (held d/t pain) 110 w/ pain     Shoulder flexion AROM                                  TREATMENT 01/26/2022:  Therapeutic Exercise: - scapular retraction - 20x - ball squeeze - 2x20 3-4'' - elbow flex ext - 20x2 - wrist pronation/supination -  20x2 - AAROM ER in supine (not beyond 40 degrees) - AAROM chest press in supine with dowel - x20 (partial ROM) - ER isometrics - 2x10 (supine)  Manual Therapy: - forward elevation PROM with gentle oscillations at end range, pt in supine  Modalities:  Vasopneumatic (Game Ready)  Location:  right shoulder Time:  10 minutes Pressure:  low Temperature:  46 degrees   TREATMENT 01/15/2022:  Therapeutic Exercise: - scapular retraction - ball squeeze - elbow flex ext - 20x2 - wrist pronation/supination - 20x - AAROM ER in supine (not beyond 40 degrees) - Pulley 2' gentle for FF  Manual Therapy: - forward elevation PROM with gentle oscillations at end range, pt in supine  Modalities:  Vasopneumatic (Game Ready)    Location:  right shoulder Time:  10 minutes Pressure:  low Temperature:  46 degrees   HOME EXERCISE PROGRAM: Access Code: [email or web address] URL: [email or web address] Date: 01/15/2022 Prepared by: Shearon Balo  Exercises Forearm Strengthening with Greig Right - 3 x daily - 7 x weekly - 3 sets - 10 reps Seated Shoulder Flexion Towel Slide at Table Top Full Range of Motion - 2 x daily - 7 x weekly - 3 sets - 10 reps Seated Elbow Flexion and Extension AROM - 1 x daily - 7 x weekly - 3 sets - 10 reps Seated Forearm Pronation and Supination AROM - 1 x daily - 7 x weekly - 3 sets - 10 reps    ASSESSMENT:   CLINICAL IMPRESSION: Meagan Flores is progressing fair with therapy.  Pt reports a mild increase in pain following therapy.  Today we concentrated on periscapular strengthening and shoulder range of motion.  Pt continues to experience high levels of pain but is slowly progressing shoulder ROM and tolerance to exercise.  Pt will continue to benefit from skilled physical therapy to address remaining deficits and achieve listed goals.  Continue per POC.     Problem list: Patient presents with pain and impairments/deficits in: shoulder strength and ROM.   Activity limitations include: reaching OH, lifting.  Participation limitations include: ADLs involving reaching, lifting, and use of R arm; work.      GOALS:   SHORT TERM GOALS:   STG Name Target Date Goal status  1 Meagan Flores will be >75% HEP compliant to improve carryover between sessions and facilitate independent management of condition   Baseline: No HEP 01/27/2022 MET 2/21    LONG TERM GOALS:    LTG Name Target Date Goal status  1 Meagan Flores will show a >/= 30 pt improvement in their QUICK DASH score (MCID is 10 pts) as a proxy for functional improvement    Baseline: 86.4 03/03/2022 INITIAL  2 Meagan Flores will demonstrate >120 degrees of active ROM in flexion to allow completion of activities involving reaching [place], not limited by pain   Baseline: 95 degrees passive   03/03/2022 INITIAL  3 Meagan Flores will be able to lift a 2# weight to second shelf x10, not limited by pain   Baseline: limited 03/03/2022 INITIAL  4 Meagan Flores will report >/= 50% decrease in pain from evaluation    Baseline: 10/10 max pain, 7/10 average 03/03/2022 INITIAL    PLAN: PT FREQUENCY: 1-2x/week   PT DURATION: 8 weeks (Ending 03/03/2022)   PLANNED INTERVENTIONS: Therapeutic exercises, Therapeutic activity, Neuro Muscular re-education, Gait training, Patient/Family education, Joint mobilization, Dry Needling, Electrical stimulation, Spinal mobilization and/or manipulation, Moist heat, Taping, Vasopneumatic device, Ionotophoresis 4mg /ml Dexamethasone, and Manual therapy   PLAN FOR NEXT SESSION: slowly work on shoulder PROM with progressive strengthening as appropriate   Kevan Ny Haytham Maher PT 01/26/2022, 9:57 AM

## 2022-01-27 ENCOUNTER — Ambulatory Visit: Payer: Self-pay | Attending: Critical Care Medicine | Admitting: Critical Care Medicine

## 2022-01-27 ENCOUNTER — Encounter: Payer: Self-pay | Admitting: Critical Care Medicine

## 2022-01-27 ENCOUNTER — Ambulatory Visit: Payer: Self-pay | Attending: Critical Care Medicine | Admitting: Clinical

## 2022-01-27 VITALS — BP 133/85 | HR 61 | Resp 16 | Wt 213.8 lb

## 2022-01-27 DIAGNOSIS — F330 Major depressive disorder, recurrent, mild: Secondary | ICD-10-CM

## 2022-01-27 DIAGNOSIS — M67921 Unspecified disorder of synovium and tendon, right upper arm: Secondary | ICD-10-CM

## 2022-01-27 DIAGNOSIS — R21 Rash and other nonspecific skin eruption: Secondary | ICD-10-CM

## 2022-01-27 DIAGNOSIS — I729 Aneurysm of unspecified site: Secondary | ICD-10-CM

## 2022-01-27 DIAGNOSIS — E782 Mixed hyperlipidemia: Secondary | ICD-10-CM

## 2022-01-27 DIAGNOSIS — E890 Postprocedural hypothyroidism: Secondary | ICD-10-CM

## 2022-01-27 DIAGNOSIS — Z720 Tobacco use: Secondary | ICD-10-CM

## 2022-01-27 DIAGNOSIS — I272 Pulmonary hypertension, unspecified: Secondary | ICD-10-CM

## 2022-01-27 DIAGNOSIS — Z8673 Personal history of transient ischemic attack (TIA), and cerebral infarction without residual deficits: Secondary | ICD-10-CM

## 2022-01-27 DIAGNOSIS — R7303 Prediabetes: Secondary | ICD-10-CM

## 2022-01-27 DIAGNOSIS — I10 Essential (primary) hypertension: Secondary | ICD-10-CM

## 2022-01-27 MED ORDER — ASPIRIN EC 81 MG PO TBEC: 81.0000 mg | DELAYED_RELEASE_TABLET | Freq: Every day | ORAL | 3 refills | Status: AC

## 2022-01-27 MED ORDER — LEVOTHYROXINE SODIUM 50 MCG PO TABS: 100.0000 ug | ORAL_TABLET | Freq: Every day | ORAL | 4 refills | Status: DC

## 2022-01-27 MED ORDER — PREGABALIN 150 MG PO CAPS: 150.0000 mg | ORAL_CAPSULE | Freq: Two times a day (BID) | ORAL | 4 refills | Status: DC

## 2022-01-27 MED ORDER — CLOBETASOL PROPIONATE 0.05 % EX CREA: 1.0000 "application " | TOPICAL_CREAM | Freq: Two times a day (BID) | CUTANEOUS | 3 refills | Status: DC

## 2022-01-27 MED ORDER — VALSARTAN 160 MG PO TABS: 160.0000 mg | ORAL_TABLET | Freq: Every day | ORAL | 4 refills | Status: DC

## 2022-01-27 MED ORDER — ATORVASTATIN CALCIUM 40 MG PO TABS: 40.0000 mg | ORAL_TABLET | Freq: Every day | ORAL | 1 refills | Status: DC

## 2022-01-27 MED ORDER — METOPROLOL SUCCINATE ER 50 MG PO TB24: 50.0000 mg | ORAL_TABLET | Freq: Every day | ORAL | 1 refills | Status: DC

## 2022-01-27 NOTE — Patient Instructions (Addendum)
Refills on all medications sent to [facility] ? ?Go to the [facility] [address] for eye exam, see handout ? ?Focus on quitting the last few cigarettes ? ?Continue your rehab on the shoulder ? ?Labs today lipid, metabolic panel, blood counts ? ?Return dr Joya Gaskins 4 months ?

## 2022-01-27 NOTE — Assessment & Plan Note (Signed)
Status post surgery in the right shoulder she is rehabbing well now ?

## 2022-01-27 NOTE — Assessment & Plan Note (Signed)
Patient has resumed cigarette use and one pack will last her 3 days. Attributes to increased stress and pain with right shoulder tendenopathy. Recommended nicotine replacement therapy  ? ? ? ?? Current smoking consumption amount: 1 pk once a week ? ?? Dicsussion on advise to quit smoking and smoking impacts: cv impacts ? ?? Patient's willingness to quit:  Wants to quit ? ?? Methods to quit smoking discussed:  Nicotine replacment ? ?? Medication management of smoking session drugs discussed: nicotine patch ? ?? Resources provided:  AVS  ? ?? Setting quit date  Not established ? ?? Follow-up arranged  Dr Joya Gaskins 4 months, ? ? ?Time spent counseling the patient:  5 min ? ? ?

## 2022-01-27 NOTE — Assessment & Plan Note (Signed)
Hypertension well controlled continue  valsartan, follow a healthy diet, obtain metabolic panel and blood counts ?

## 2022-01-27 NOTE — Assessment & Plan Note (Signed)
Improved with Temovate cream continue same ?

## 2022-01-27 NOTE — Assessment & Plan Note (Signed)
Thyroid function normal in August 2022 continue current dose of Synthroid ?

## 2022-01-27 NOTE — Assessment & Plan Note (Signed)
Due to vascular disease needs to continue cholesterol therapy will assess lipid panel at this visit maintain atorvastatin 40 mg daily and aspirin 81 mg daily ?

## 2022-01-27 NOTE — Therapy (Signed)
OUTPATIENT PHYSICAL THERAPY TREATMENT NOTE   Patient Name: Meagan Flores MRN: [id number] DOB:[birth year], 56 y.o., female Today's Date: 01/28/2022  PCP: Elsie Stain, MD REFERRING PROVIDER: Nathaniel Man   PT End of Session - 01/28/22 1034     Visit Number 5    Number of Visits 16    Date for PT Re-Evaluation 03/03/22    Authorization Type self pay    PT Start Time 8315    PT Stop Time 1130    PT Time Calculation (min) 45 min    Activity Tolerance Patient tolerated treatment well    Behavior During Therapy [facility] for tasks assessed/performed              Past Medical History:  Diagnosis Date   Acute pain of left shoulder 09/08/2017   Aneurysm ([place]) 03/18/2020   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   COVID-19 virus infection 02/14/2020   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) tear of right shoulder 09/30/2020   Essential hypertension    Glenohumeral arthritis 12/27/2014   History of stroke    History of TIA (transient ischemic attack) 02/14/2020   Hyperlipidemia    Hypothyroidism    Impingement syndrome of right shoulder 12/24/2016   Mixed hyperlipidemia    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   Prediabetes    Preop cardiovascular exam 11/25/2020   Pulmonary hypertension, unspecified ([place]) 11/25/2020   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   Rotator cuff impingement syndrome of right shoulder 09/30/2020   Skin rash 02/17/2021   Stroke [facility])    Tendinopathy of right biceps tendon 09/30/2020   Thyroid disease    TIA (transient ischemic attack) 04/15/2018   Tobacco use 09/28/2016   Past Surgical History:  Procedure Laterality Date   BICEPT TENODESIS Right 12/16/2021   Procedure: RIGHT BICEPS TENODESIS;  Surgeon: Leandrew Koyanagi, MD;  Location: [place];  Service: Orthopedics;  Laterality: Right;   PARTIAL HYSTERECTOMY     right arm sx     SHOULDER ARTHROSCOPY WITH BICEPSTENOTOMY Right 01/21/2021   Procedure: SHOULDER  ARTHROSCOPY WITH BICEPSTENOTOMY;  Surgeon: Leandrew Koyanagi, MD;  Location: [place];  Service: Orthopedics;  Laterality: Right;   SHOULDER ARTHROSCOPY WITH DISTAL CLAVICLE RESECTION Right 01/12/2017   Procedure: RIGHT SHOULDER ARTHROSCOPY WITH  SUBACROMIAL DECOMPRESSION, DISTAL CLAVICLE EXCISION, extensive debridement;  Surgeon: Leandrew Koyanagi, MD;  Location: [place];  Service: Orthopedics;  Laterality: Right;   THYMECTOMY  2004   THYROIDECTOMY     Patient Active Problem List   Diagnosis Date Noted   Skin rash 02/17/2021   Preop cardiovascular exam 11/25/2020   Pulmonary hypertension, unspecified ([place]) 11/25/2020   Prediabetes    Hyperlipidemia    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) tear of right shoulder 09/30/2020   Rotator cuff impingement syndrome of right shoulder 09/30/2020   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   Tendinopathy of right biceps tendon 09/30/2020   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   Aneurysm ([employer]) 03/18/2020   History of TIA (transient ischemic attack) 02/14/2020   History of stroke    Mixed hyperlipidemia    Essential hypertension    Acute pain of left shoulder 09/08/2017   Impingement syndrome of right shoulder 12/24/2016   Tobacco use 09/28/2016   Glenohumeral arthritis 12/27/2014   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    REFERRING DIAG: Referral diagnosis: Biceps tendonosis of right shoulder [M67.813]  THERAPY DIAG:  Chronic right shoulder pain  Stiffness of right shoulder, not elsewhere classified  Muscle  weakness (generalized)  Localized edema  PERTINENT HISTORY: Hx of debridement and sub acromial decompression x2 (2018 and 2022). Hx of stroke, prediabetes  PRECAUTIONS/RESTRICTIONS:   PRECAUTIONS: Shoulder biceps tenodesis 1/18   WEIGHT BEARING RESTRICTIONS Yes see precautions  SUBJECTIVE:  Pt reports 7-8/10 Rt shoulder pain today. She reports that she had severe pain  during and after her appointment on Tuesday, which she reports was due to her Rt shoulder being pushed into ER. She reports that the pain has improved since that time, although it is still high compared to her baseline.  Pain:  Are you having pain? Yes Pain location: Rt shoulder NPRS scale: current 7-8/10  Aggravating factors: shoulder movement Relieving factors: rest, ice Pain description: intermittent, sharp, and aching Severity: high Irritability: low Stage: Chronic Stability: staying the same 24 hour pattern: worst at night   OBJECTIVE:  UPPER EXTREMITY AROM:   ROM Right 01/06/2022 Left 01/06/2022 Right 01/28/2022  Shoulder flexion   140 52p!  Shoulder abduction       Shoulder internal rotation       Shoulder external rotation       Functional IR       Functional ER       Shoulder extension       Elbow extension       Elbow flexion         (Blank rows = not tested, N = WNL, * = concordant pain with testing)   UPPER EXTREMITY MMT:   MMT Right 01/06/2022 Left 01/06/2022  Shoulder flexion      Shoulder abduction (C5)      Shoulder ER      Shoulder IR      Middle trapezius      Lower trapezius      Shoulder extension      Grip strength      Cervical flexion (C1,C2)      Cervical S/B (C3)      Shoulder shrug (C4)      Elbow flexion (C6)      Elbow ext (C7)      Thumb ext (C8)      Finger abd (T1)      Grossly        (Blank rows = not tested, score listed is out of 5 possible points.  N = WNL, D = diminished, C = clear for gross weakness with myotome testing, * = concordant pain with testing)                UPPER EXTREMITY PROM:   PROM Right 01/06/2022 Left 01/06/2022 Right 01/28/2022  Shoulder flexion 95*   95p! At end range  Shoulder abduction       Shoulder internal rotation       Shoulder external rotation >40 degrees*     Functional IR       Functional ER       Shoulder extension       Elbow extension Full Full   Elbow flexion         (Blank rows = not  tested, N = WNL, * = concordant pain with testing)   ASTERISK SIGNS   Asterisk Signs 2/17 2/17 2/28     Shoulder flexion PROM 100 100 (held d/t pain) 110 w/ pain     Shoulder flexion AROM  [place] [facility] Treatment:                                                DATE: 01/28/2022 Therapeutic Exercise: Seated Rt shoulder scaption AAROM with wand 2x10 Seated Rt shoulder ER AAROM with wand 2x10 Seated Rt shoulder pendulums 3x30sec Standing Rt shoulder flexion isometric with 50% force into doorway 2x10 with 5-sec hold Standing Rt shoulder ER isometric with 50% force into doorway 2x10 with 5-sec hold Standing Rt shoulder abduction isometric with 50% force into doorway 2x10 with 5-sec hold Manual Therapy: Rt shoulder flexion PROM with gentle distraction and vibration x8 minutes Neuromuscular re-ed: N/A Therapeutic Activity: N/A Modalities: N/A Self Care: N/A    TREATMENT 01/26/2022:  Therapeutic Exercise: - scapular retraction - 20x - ball squeeze - 2x20 3-4'' - elbow flex ext - 20x2 - wrist pronation/supination - 20x2 - AAROM ER in supine (not beyond 40 degrees) - AAROM chest press in supine with dowel - x20 (partial ROM) - ER isometrics - 2x10 (supine)  Manual Therapy: - forward elevation PROM with gentle oscillations at end range, pt in supine  Modalities:  Vasopneumatic (Game Ready)    Location:  right shoulder Time:  10 minutes Pressure:  low Temperature:  46 degrees   TREATMENT 01/15/2022:  Therapeutic Exercise: - scapular retraction - ball squeeze - elbow flex ext - 20x2 - wrist pronation/supination - 20x - AAROM ER in supine (not beyond 40 degrees) - Pulley 2' gentle for FF  Manual Therapy: - forward elevation PROM with gentle oscillations at end range, pt in supine  Modalities:  Vasopneumatic (Game Ready)    Location:  right shoulder Time:  10 minutes Pressure:  low Temperature:  46 degrees   HOME EXERCISE  PROGRAM: Access Code: [email or web address] URL: [email or web address] Date: 01/15/2022 Prepared by: Shearon Balo  Exercises Forearm Strengthening with Greig Right - 3 x daily - 7 x weekly - 3 sets - 10 reps Seated Shoulder Flexion Towel Slide at Table Top Full Range of Motion - 2 x daily - 7 x weekly - 3 sets - 10 reps Seated Elbow Flexion and Extension AROM - 1 x daily - 7 x weekly - 3 sets - 10 reps Seated Forearm Pronation and Supination AROM - 1 x daily - 7 x weekly - 3 sets - 10 reps  Added 01/28/2022: Shoulder Scaption AAROM with Dowel - 1 x daily - 7 x weekly - 2 sets - 10 reps - 5-sec hold Seated Shoulder External Rotation AAROM with Cane and Hand in Neutral - 1 x daily - 7 x weekly - 2 sets - 10 reps - 5-sec hold Standing Isometric Shoulder External Rotation with Doorway *50% FORCE* - 1 x daily - 7 x weekly - 2 sets - 10 reps - 5-sec hold Standing Isometric Shoulder Flexion with Doorway - Arm Bent - 1 x daily - 7 x weekly - 2 sets - 10 reps - 5-sec hold Standing Isometric Shoulder Abduction with Doorway - Arm Bent *50% FORCE* - 1 x daily - 7 x weekly - 2 sets - 10 reps - 5-sec hold    ASSESSMENT:   CLINICAL IMPRESSION: Pt continues to have high-level pain throughout today's treatment, which is exacerbated with shoulder elevation AROM. However, she reports a therapeutic response to shoulder pendulums and also tolerated early isometric strengthening well. Her shoulder flexion ROM remains  grossly unchanged since her last measurement. There is a probable need for pain modulation techniques added throughout her future treatment sessions for best results. She will continue to benefit from skilled PT to address her primary impairments and return to her prior level of function with less limitation.     Problem list: Patient presents with pain and impairments/deficits in: shoulder strength and ROM.  Activity limitations include: reaching OH, lifting.  Participation limitations include:  ADLs involving reaching, lifting, and use of R arm; work.      GOALS:   SHORT TERM GOALS:   STG Name Target Date Goal status  1 Nakeysha will be >75% HEP compliant to improve carryover between sessions and facilitate independent management of condition   Baseline: No HEP 01/27/2022 MET 2/21    LONG TERM GOALS:    LTG Name Target Date Goal status  1 Alliya will show a >/= 30 pt improvement in their QUICK DASH score (MCID is 10 pts) as a proxy for functional improvement    Baseline: 86.4 03/03/2022 INITIAL  2 Verl will demonstrate >120 degrees of active ROM in flexion to allow completion of activities involving reaching [place], not limited by pain   Baseline: 95 degrees passive   03/03/2022 INITIAL  3 Marjorie will be able to lift a 2# weight to second shelf x10, not limited by pain   Baseline: limited 03/03/2022 INITIAL  4 Shamona will report >/= 50% decrease in pain from evaluation    Baseline: 10/10 max pain, 7/10 average 03/03/2022 INITIAL    PLAN: PT FREQUENCY: 1-2x/week   PT DURATION: 8 weeks (Ending 03/03/2022)   PLANNED INTERVENTIONS: Therapeutic exercises, Therapeutic activity, Neuro Muscular re-education, Gait training, Patient/Family education, Joint mobilization, Dry Needling, Electrical stimulation, Spinal mobilization and/or manipulation, Moist heat, Taping, Vasopneumatic device, Ionotophoresis 4mg /ml Dexamethasone, and Manual therapy   PLAN FOR NEXT SESSION: Progress shoulder P/AROM as able with progressive strengthening as appropriate   Vanessa Aucilla, PT, DPT 01/28/22 11:30 AM

## 2022-01-27 NOTE — Progress Notes (Signed)
Established Patient Office Visit  Subjective:  Patient ID: Meagan Flores, female    DOB: May 17, 1966  Age: 56 y.o. MRN: [id number]  CC:  Chief Complaint  Patient presents with   Hypertension    HPI Meagan Flores presents for primary care follow-up.  On arrival blood pressure is good 133/85.  Patient has undergone surgery of the right biceps tendon of the right shoulder due to tendinosis since I last saw her.  The surgery occurred in January and she is undergoing rehab now.  She still has some pain in the shoulder but is improving.  She has reduced her cigarette content down to 1 pack every week.  She has been visiting with our clinical social worker for counseling on depression and anxiety.  This has been beneficial to her.  She did have a viral type illness in January she did not get COVID tested.  This causes weakness fatigue and blurry vision.  She still has cloudiness to her vision in the left eye.  She does not see an eye doctor.  She is trying to apply for disability but has not yet been improved.  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  Patient has no other real complaints at this time.  She needs refills on all her medications.  She has been compliant with her blood pressure cholesterol and thyroid medicines.  She states the Temovate cream helped her rash on her arms.  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   COVID-19 virus infection 02/14/2020   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) tear of right shoulder 09/30/2020   Essential hypertension    Glenohumeral arthritis 12/27/2014   History of stroke    History of TIA (transient ischemic attack) 02/14/2020   Hyperlipidemia    Hypothyroidism    Impingement syndrome of right shoulder 12/24/2016   Mixed hyperlipidemia    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    Prediabetes    Preop cardiovascular exam 11/25/2020   Pulmonary hypertension, unspecified ([place]) 11/25/2020   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   Rotator cuff impingement syndrome of right shoulder 09/30/2020   Skin rash 02/17/2021   Stroke [facility])    Tendinopathy of right biceps tendon 09/30/2020   Thyroid disease    TIA (transient ischemic attack) 04/15/2018   Tobacco use 09/28/2016    Past Surgical History:  Procedure Laterality Date   BICEPT TENODESIS Right 12/16/2021   Procedure: RIGHT BICEPS TENODESIS;  Surgeon: Leandrew Koyanagi, MD;  Location: [place];  Service: Orthopedics;  Laterality: Right;   PARTIAL HYSTERECTOMY     right arm sx     SHOULDER ARTHROSCOPY WITH BICEPSTENOTOMY Right 01/21/2021   Procedure: SHOULDER ARTHROSCOPY WITH BICEPSTENOTOMY;  Surgeon: Leandrew Koyanagi, MD;  Location: [place];  Service: Orthopedics;  Laterality: Right;   SHOULDER ARTHROSCOPY WITH DISTAL CLAVICLE RESECTION Right 01/12/2017   Procedure: RIGHT SHOULDER ARTHROSCOPY WITH  SUBACROMIAL DECOMPRESSION, DISTAL CLAVICLE EXCISION, extensive debridement;  Surgeon: Leandrew Koyanagi, MD;  Location: [place];  Service: Orthopedics;  Laterality: Right;   THYMECTOMY  2004   THYROIDECTOMY      Family History  Problem Relation Age of Onset   Stomach cancer Sister    Colon polyps Sister    Esophageal cancer Neg Hx    Rectal cancer Neg Hx    Colon cancer  Neg Hx    Breast cancer Neg Hx     Social History   Socioeconomic History   Marital status: Married    Spouse name: Not on file   Number of children: Not on file   Years of education: Not on file   Highest education level: Not on file  Occupational History   Not on file  Tobacco Use   Smoking status: Every Day    Packs/day: 0.50    Years: 34.00    Pack years: 17.00    Types: Cigarettes   Smokeless tobacco: Never  Vaping Use   Vaping Use: Never used  Substance and Sexual Activity   Alcohol use: Yes     Comment: occassional   Drug use: No   Sexual activity: Not on file  Other Topics Concern   Not on file  Social History Narrative   Not on file   Social Determinants of Health   Financial Resource Strain: Not on file  Food Insecurity: Not on file  Transportation Needs: Not on file  Physical Activity: Not on file  Stress: Not on file  Social Connections: Not on file  Intimate Partner Violence: Not on file    Outpatient Medications Prior to Visit  Medication Sig Dispense Refill   HYDROcodone-acetaminophen (NORCO) 5-325 MG tablet Take 1 tablet by mouth 3 (three) times daily as needed. 20 tablet 0   oxyCODONE-acetaminophen (PERCOCET) 5-325 MG tablet Take 1-2 tablets by mouth every 8 (eight) hours as needed for severe pain. 30 tablet 0   aspirin EC 81 MG tablet Take 1 tablet (81 mg total) by mouth daily. 60 tablet 3   atorvastatin (LIPITOR) 40 MG tablet Take 1 tablet (40 mg total) by mouth daily at 6 PM. 90 tablet 1   clobetasol cream (TEMOVATE) 9.98 % Apply 1 application topically 2 (two) times daily. 30 g 3   levothyroxine (EUTHYROX) 50 MCG tablet Take 2 tablets (100 mcg total) by mouth daily before breakfast. 60 tablet 4   metoprolol succinate (TOPROL-XL) 50 MG 24 hr tablet Take 1 tablet (50 mg total) by mouth daily. Take with or immediately following a meal. 90 tablet 1   pregabalin (LYRICA) 150 MG capsule Take 1 capsule (150 mg total) by mouth 2 (two) times daily. 60 capsule 4   valsartan (DIOVAN) 160 MG tablet Take 1 tablet (160 mg total) by mouth daily. 60 tablet 4   No facility-administered medications prior to visit.    Allergies  Allergen Reactions   Sulfa Antibiotics Hives    ROS Review of Systems  Constitutional: Negative.   HENT: Negative.  Negative for ear pain, postnasal drip, rhinorrhea, sinus pressure, sore throat, trouble swallowing and voice change.   Eyes:  Positive for visual disturbance.  Respiratory: Negative.  Negative for apnea, cough, choking, chest  tightness, shortness of breath, wheezing and stridor.   Cardiovascular: Negative.  Negative for chest pain, palpitations and leg swelling.  Gastrointestinal: Negative.  Negative for abdominal distention, abdominal pain, nausea and vomiting.  Genitourinary: Negative.   Musculoskeletal: Negative.  Negative for arthralgias and myalgias.       Right shoulder pain  Skin: Negative.  Negative for rash.  Allergic/Immunologic: Negative.  Negative for environmental allergies and food allergies.  Neurological: Negative.  Negative for dizziness, syncope, weakness and headaches.  Hematological: Negative.  Negative for adenopathy. Does not bruise/bleed easily.  Psychiatric/Behavioral: Negative.  Negative for agitation and sleep disturbance. The patient is not nervous/anxious.      Objective:  Physical Exam Vitals reviewed.  Constitutional:      Appearance: Normal appearance. She is well-developed. She is obese. She is not diaphoretic.  HENT:     Head: Normocephalic and atraumatic.     Nose: Nose normal. No nasal deformity, septal deviation, mucosal edema or rhinorrhea.     Right Sinus: No maxillary sinus tenderness or frontal sinus tenderness.     Left Sinus: No maxillary sinus tenderness or frontal sinus tenderness.     Mouth/Throat:     Mouth: Mucous membranes are moist.     Pharynx: Oropharynx is clear. No oropharyngeal exudate.  Eyes:     General: Lids are normal. Vision grossly intact. Gaze aligned appropriately. No allergic shiner, visual field deficit or scleral icterus.       Right eye: No foreign body, discharge or hordeolum.        Left eye: No foreign body, discharge or hordeolum.     Extraocular Movements: Extraocular movements intact.     Conjunctiva/sclera: Conjunctivae normal.     Right eye: Right conjunctiva is not injected. No chemosis, exudate or hemorrhage.    Left eye: Left conjunctiva is not injected. No chemosis, exudate or hemorrhage.    Pupils: Pupils are equal, round,  and reactive to light.  Neck:     Thyroid: No thyromegaly.     Vascular: No carotid bruit or JVD.     Trachea: Trachea normal. No tracheal tenderness or tracheal deviation.  Cardiovascular:     Rate and Rhythm: Normal rate and regular rhythm.     Chest Wall: PMI is not displaced.     Pulses: Normal pulses. No decreased pulses.     Heart sounds: Normal heart sounds, S1 normal and S2 normal. Heart sounds not distant. No murmur heard. No systolic murmur is present.  No diastolic murmur is present.    No friction rub. No gallop. No S3 or S4 sounds.  Pulmonary:     Effort: No tachypnea, accessory muscle usage or respiratory distress.     Breath sounds: No stridor. No decreased breath sounds, wheezing, rhonchi or rales.  Chest:     Chest wall: No tenderness.  Abdominal:     General: Bowel sounds are normal. There is no distension.     Palpations: Abdomen is soft. Abdomen is not rigid.     Tenderness: There is no abdominal tenderness. There is no guarding or rebound.  Musculoskeletal:        General: Normal range of motion.     Cervical back: Normal range of motion and neck supple. No edema, erythema or rigidity. No muscular tenderness. Normal range of motion.  Lymphadenopathy:     Head:     Right side of head: No submental or submandibular adenopathy.     Left side of head: No submental or submandibular adenopathy.     Cervical: No cervical adenopathy.  Skin:    General: Skin is warm and dry.     Coloration: Skin is not pale.     Findings: No rash.     Nails: There is no clubbing.  Neurological:     Mental Status: She is alert and oriented to person, place, and time.     Sensory: No sensory deficit.  Psychiatric:        Speech: Speech normal.        Behavior: Behavior normal.    BP 133/85    Pulse 61    Resp 16    Wt 213 lb 12.8 oz (97  kg)    SpO2 100%    BMI 32.51 kg/m  Wt Readings from Last 3 Encounters:  01/27/22 213 lb 12.8 oz (97 kg)  12/16/21 215 lb 13.3 oz (97.9 kg)   09/29/21 214 lb 3.2 oz (97.2 kg)     There are no preventive care reminders to display for this patient.  There are no preventive care reminders to display for this patient.  Lab Results  Component Value Date   TSH 6.990 (H) 06/29/2021   Lab Results  Component Value Date   WBC 7.3 11/04/2020   HGB 11.7 (L) 11/04/2020   HCT 36.3 11/04/2020   MCV 74.8 (L) 11/04/2020   PLT 233 11/04/2020   Lab Results  Component Value Date   NA 140 11/04/2020   K 3.5 11/04/2020   CO2 26 11/04/2020   GLUCOSE 113 (H) 11/04/2020   BUN 15 11/04/2020   CREATININE 0.78 11/04/2020   BILITOT 0.4 11/04/2020   ALKPHOS 69 11/04/2020   AST 16 11/04/2020   ALT 13 11/04/2020   PROT 7.4 11/04/2020   ALBUMIN 3.8 11/04/2020   CALCIUM 8.1 (L) 11/04/2020   ANIONGAP 11 11/04/2020   Lab Results  Component Value Date   CHOL 203 (H) 04/15/2018   Lab Results  Component Value Date   HDL 45 04/15/2018   Lab Results  Component Value Date   LDLCALC 130 (H) 04/15/2018   Lab Results  Component Value Date   TRIG 140 04/15/2018   Lab Results  Component Value Date   CHOLHDL 4.5 04/15/2018   Lab Results  Component Value Date   HGBA1C 5.8 (A) 10/21/2020      Assessment & Plan:   Problem List Items Addressed This Visit       Cardiovascular and Mediastinum   Essential hypertension    Hypertension well controlled continue  valsartan, follow a healthy diet, obtain metabolic panel and blood counts      Relevant Medications   aspirin EC 81 MG tablet   atorvastatin (LIPITOR) 40 MG tablet   metoprolol succinate (TOPROL-XL) 50 MG 24 hr tablet   valsartan (DIOVAN) 160 MG tablet   Other Relevant Orders   CBC with Differential/Platelet   Aneurysm ([place])    Due to vascular disease needs to continue cholesterol therapy will assess lipid panel at this visit maintain atorvastatin 40 mg daily and aspirin 81 mg daily      Relevant Medications   aspirin EC 81 MG tablet   atorvastatin (LIPITOR) 40 MG  tablet   metoprolol succinate (TOPROL-XL) 50 MG 24 hr tablet   valsartan (DIOVAN) 160 MG tablet   Other Relevant Orders   Lipid panel   Pulmonary hypertension, unspecified ([facility])   Relevant Medications   aspirin EC 81 MG tablet   atorvastatin (LIPITOR) 40 MG tablet   metoprolol succinate (TOPROL-XL) 50 MG 24 hr tablet   valsartan (DIOVAN) 160 MG tablet     Endocrine   Postoperative hypothyroidism    Thyroid function normal in August 2022 continue current dose of Synthroid      Relevant Medications   levothyroxine (EUTHYROX) 50 MCG tablet   metoprolol succinate (TOPROL-XL) 50 MG 24 hr tablet     Musculoskeletal and Integument   Skin rash    Improved with Temovate cream continue same      Tendinopathy of right biceps tendon    Status post surgery in the right shoulder she is rehabbing well now        Other  Mixed hyperlipidemia - Primary   Relevant Medications   aspirin EC 81 MG tablet   atorvastatin (LIPITOR) 40 MG tablet   metoprolol succinate (TOPROL-XL) 50 MG 24 hr tablet   valsartan (DIOVAN) 160 MG tablet   Tobacco use    Patient has resumed cigarette use and one pack will last her 3 days. Attributes to increased stress and pain with right shoulder tendenopathy. Recommended nicotine replacement therapy     Current smoking consumption amount: 1 pk once a week  Dicsussion on advise to quit smoking and smoking impacts: cv impacts  Patient's willingness to quit:  Wants to quit  Methods to quit smoking discussed:  Nicotine replacment  Medication management of smoking session drugs discussed: nicotine patch  Resources provided:  AVS   Setting quit date  Not established  Follow-up arranged  Dr Joya Gaskins 4 months,   Time spent counseling the patient:  5 min        History of TIA (transient ischemic attack)   Relevant Medications   atorvastatin (LIPITOR) 40 MG tablet   Other Relevant Orders   Lipid panel   Prediabetes   Relevant Orders   Comprehensive  metabolic panel    Meds ordered this encounter  Medications   aspirin EC 81 MG tablet    Sig: Take 1 tablet (81 mg total) by mouth daily.    Dispense:  60 tablet    Refill:  3   atorvastatin (LIPITOR) 40 MG tablet    Sig: Take 1 tablet (40 mg total) by mouth daily at 6 PM.    Dispense:  90 tablet    Refill:  1   clobetasol cream (TEMOVATE) 0.05 %    Sig: Apply 1 application topically 2 (two) times daily.    Dispense:  30 g    Refill:  3   levothyroxine (EUTHYROX) 50 MCG tablet    Sig: Take 2 tablets (100 mcg total) by mouth daily before breakfast.    Dispense:  60 tablet    Refill:  4   metoprolol succinate (TOPROL-XL) 50 MG 24 hr tablet    Sig: Take 1 tablet (50 mg total) by mouth daily. Take with or immediately following a meal.    Dispense:  90 tablet    Refill:  1   pregabalin (LYRICA) 150 MG capsule    Sig: Take 1 capsule (150 mg total) by mouth 2 (two) times daily.    Dispense:  60 capsule    Refill:  4   valsartan (DIOVAN) 160 MG tablet    Sig: Take 1 tablet (160 mg total) by mouth daily.    Dispense:  60 tablet    Refill:  4    Follow-up: Return in about 4 months (around 05/29/2022).    Asencion Noble, MD

## 2022-01-27 NOTE — BH Specialist Note (Signed)
Integrated Behavioral Health Follow Up In-Person Visit ? ?MRN: [id number] ?Name: Meagan Flores ? ?Number of [facility] Clinician visits: Additional Visit ? ?Session Start time: 0912 ?  ?Session End time: 0927 ? ?Total time in minutes: 15 ? ? ?Types of Service: Individual psychotherapy ? ?Interpretor:No. Interpretor Name and Language: N/A ? ?Subjective: ?Meagan Flores is a 56 y.o. female accompanied by  self ?Patient was referred by Asencion Noble, MD for depression and anxiety. ?Patient reports the following symptoms/concerns: Reports that she continues to adjust to not working anymore. Reports that she does not like sitting at home. Reports that she has been communicating more with her spouse and informed him of her concerns with him not wanting to go out often. Reports that she was recently sick and believes she had COVID.  ?Duration of problem: 2 years; Severity of problem: mild ? ?Objective: ?Mood: Depressed and Affect: Appropriate ?Risk of harm to self or others: No plan to harm self or others ? ?Life Context: ?Family and Social: Reports her spouse and children as support system.  ?School/Work: Reports that she is unemployed due to having a shoulder injury. Reports that her spouse provides financially. Reports that she has applied for disability and has an attorney assisting her. ?Self-Care: Pt uses tobacco as coping skill. ?Life Changes: Reports that her parents passed away about two years ago and she has been trying to adjust. Reports that she has also had a shoulder injury that her caused her to be unable to work.  ?(No changes to life context)  ? ?Patient and/or Family's Strengths/Protective Factors: ?Concrete supports in place (healthy food, safe environments, etc.) ? ?Goals Addressed: ?Patient will: ? Reduce symptoms of: anxiety and depression  ? Increase knowledge and/or ability of: coping skills  ? Demonstrate ability to: Increase healthy adjustment to current life circumstances and Begin  healthy grieving over loss ? ?Progress towards Goals: ?Ongoing ? ?Interventions: ?Interventions utilized:  Supportive Counseling and Communication Skills ?Standardized Assessments completed: Not Needed ? ?Patient and/or Family Response: Pt receptive to tx. Pt receptive to support provided as she discussed her progress with communication.  ? ?Patient Centered Plan: ?Patient is on the following Treatment Plan(s): Depression ? ?Assessment: ?Denies SI/HI. Patient currently experiencing mild depression. Pt has improved her communication with her spouse however still has problems with him not wanting to go out. Pt also states that she is "fine" related to her grieving the loss of her parents.  ? ?Patient may benefit from continuing to increase communication skills with her spouse. LCSW provided assistance with communication. LCSW also reminded pt of integrated care. LCSW will fu for an additonal session with pt and refer out if requested however pt currently does not request this. . ? ?Plan: ?Follow up with behavioral health clinician on : 02/24/22 ?Behavioral recommendations: Continue communication with spouse and increase time outside of the home.  ?Referral(s): [facility] (In Clinic) ?"From scale of 1-10, how likely are you to follow plan?": 10 ? ?Juriel Cid C Iasia Forcier, LCSW ? ? ?

## 2022-01-28 ENCOUNTER — Ambulatory Visit: Payer: Self-pay | Attending: Physician Assistant

## 2022-01-28 ENCOUNTER — Other Ambulatory Visit: Payer: Self-pay

## 2022-01-28 ENCOUNTER — Telehealth: Payer: Self-pay

## 2022-01-28 DIAGNOSIS — G8929 Other chronic pain: Secondary | ICD-10-CM | POA: Insufficient documentation

## 2022-01-28 DIAGNOSIS — M6281 Muscle weakness (generalized): Secondary | ICD-10-CM | POA: Insufficient documentation

## 2022-01-28 DIAGNOSIS — M25611 Stiffness of right shoulder, not elsewhere classified: Secondary | ICD-10-CM | POA: Insufficient documentation

## 2022-01-28 DIAGNOSIS — R600 Localized edema: Secondary | ICD-10-CM | POA: Insufficient documentation

## 2022-01-28 DIAGNOSIS — M25511 Pain in right shoulder: Secondary | ICD-10-CM | POA: Insufficient documentation

## 2022-01-28 LAB — CBC WITH DIFFERENTIAL/PLATELET
Basophils Absolute: 0 10*3/uL (ref 0.0–0.2)
Basos: 0 %
EOS (ABSOLUTE): 0 10*3/uL (ref 0.0–0.4)
Eos: 1 %
Hematocrit: 39.7 % (ref 34.0–46.6)
Hemoglobin: 12.2 g/dL (ref 11.1–15.9)
Immature Grans (Abs): 0 10*3/uL (ref 0.0–0.1)
Immature Granulocytes: 0 %
Lymphocytes Absolute: 2.7 10*3/uL (ref 0.7–3.1)
Lymphs: 38 %
MCH: 23.1 pg — ABNORMAL LOW (ref 26.6–33.0)
MCHC: 30.7 g/dL — ABNORMAL LOW (ref 31.5–35.7)
MCV: 75 fL — ABNORMAL LOW (ref 79–97)
Monocytes Absolute: 0.7 10*3/uL (ref 0.1–0.9)
Monocytes: 9 %
Neutrophils Absolute: 3.6 10*3/uL (ref 1.4–7.0)
Neutrophils: 52 %
Platelets: 375 10*3/uL (ref 150–450)
RBC: 5.27 x10E6/uL (ref 3.77–5.28)
RDW: 14.7 % (ref 11.7–15.4)
WBC: 7 10*3/uL (ref 3.4–10.8)

## 2022-01-28 LAB — LIPID PANEL
Chol/HDL Ratio: 6 ratio — ABNORMAL HIGH (ref 0.0–4.4)
Cholesterol, Total: 253 mg/dL — ABNORMAL HIGH (ref 100–199)
HDL: 42 mg/dL (ref >39)
LDL Chol Calc (NIH): 194 mg/dL — ABNORMAL HIGH (ref 0–99)
Triglycerides: 97 mg/dL (ref 0–149)
VLDL Cholesterol Cal: 17 mg/dL (ref 5–40)

## 2022-01-28 LAB — COMPREHENSIVE METABOLIC PANEL
ALT: 11 IU/L (ref 0–32)
AST: 10 IU/L (ref 0–40)
Albumin/Globulin Ratio: 1.6 (ref 1.2–2.2)
Albumin: 4.7 g/dL (ref 3.8–4.9)
Alkaline Phosphatase: 98 IU/L (ref 44–121)
BUN/Creatinine Ratio: 11 (ref 9–23)
BUN: 9 mg/dL (ref 6–24)
Bilirubin Total: <0.2 mg/dL (ref 0.0–1.2)
CO2: 26 mmol/L (ref 20–29)
Calcium: 9.2 mg/dL (ref 8.7–10.2)
Chloride: 105 mmol/L (ref 96–106)
Creatinine, Ser: 0.8 mg/dL (ref 0.57–1.00)
Globulin, Total: 3 g/dL (ref 1.5–4.5)
Glucose: 101 mg/dL — ABNORMAL HIGH (ref 70–99)
Potassium: 4.6 mmol/L (ref 3.5–5.2)
Sodium: 145 mmol/L — ABNORMAL HIGH (ref 134–144)
Total Protein: 7.7 g/dL (ref 6.0–8.5)
eGFR: 86 mL/min/{1.73_m2} (ref >59)

## 2022-01-28 NOTE — Telephone Encounter (Signed)
Pt was called and is aware of results, DOB was confirmed.  ?

## 2022-01-28 NOTE — Telephone Encounter (Signed)
-----   Message from Elsie Stain, MD sent at 01/28/2022  6:44 AM EST ----- ?Let pt know blood counts normal, kidney liver normal ? ?Cholesterol very high, I did refill the atorvastatin pls get back on the atorvastatin and follow a healthy low fat diet ?

## 2022-02-03 ENCOUNTER — Other Ambulatory Visit: Payer: Self-pay

## 2022-02-03 ENCOUNTER — Encounter: Payer: Self-pay | Admitting: Physical Therapy

## 2022-02-03 ENCOUNTER — Ambulatory Visit: Payer: Self-pay | Admitting: Physical Therapy

## 2022-02-03 DIAGNOSIS — R600 Localized edema: Secondary | ICD-10-CM

## 2022-02-03 DIAGNOSIS — G8929 Other chronic pain: Secondary | ICD-10-CM

## 2022-02-03 DIAGNOSIS — M6281 Muscle weakness (generalized): Secondary | ICD-10-CM

## 2022-02-03 DIAGNOSIS — M25611 Stiffness of right shoulder, not elsewhere classified: Secondary | ICD-10-CM

## 2022-02-03 NOTE — Therapy (Signed)
OUTPATIENT PHYSICAL THERAPY TREATMENT NOTE   Patient Name: Meagan Flores MRN: [id number] DOB:[birth year], 56 y.o., female Today's Date: 02/03/2022  PCP: Elsie Stain, MD REFERRING PROVIDER: Nathaniel Man   PT End of Session - 02/03/22 1131     Visit Number 6    Number of Visits 16    Date for PT Re-Evaluation 03/03/22    Authorization Type self pay    PT Start Time 1131    PT Stop Time 1211   +10 min for game ready   PT Time Calculation (min) 40 min    Activity Tolerance Patient tolerated treatment well    Behavior During Therapy [facility] for tasks assessed/performed              Past Medical History:  Diagnosis Date   Acute pain of left shoulder 09/08/2017   Aneurysm ([place]) 03/18/2020   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   COVID-19 virus infection 02/14/2020   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) tear of right shoulder 09/30/2020   Essential hypertension    Glenohumeral arthritis 12/27/2014   History of stroke    History of TIA (transient ischemic attack) 02/14/2020   Hyperlipidemia    Hypothyroidism    Impingement syndrome of right shoulder 12/24/2016   Mixed hyperlipidemia    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   Prediabetes    Preop cardiovascular exam 11/25/2020   Pulmonary hypertension, unspecified ([place]) 11/25/2020   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   Rotator cuff impingement syndrome of right shoulder 09/30/2020   Skin rash 02/17/2021   Stroke [facility])    Tendinopathy of right biceps tendon 09/30/2020   Thyroid disease    TIA (transient ischemic attack) 04/15/2018   Tobacco use 09/28/2016   Past Surgical History:  Procedure Laterality Date   BICEPT TENODESIS Right 12/16/2021   Procedure: RIGHT BICEPS TENODESIS;  Surgeon: Leandrew Koyanagi, MD;  Location: [place];  Service: Orthopedics;  Laterality: Right;   PARTIAL HYSTERECTOMY     right arm sx     SHOULDER ARTHROSCOPY WITH BICEPSTENOTOMY Right  01/21/2021   Procedure: SHOULDER ARTHROSCOPY WITH BICEPSTENOTOMY;  Surgeon: Leandrew Koyanagi, MD;  Location: [place];  Service: Orthopedics;  Laterality: Right;   SHOULDER ARTHROSCOPY WITH DISTAL CLAVICLE RESECTION Right 01/12/2017   Procedure: RIGHT SHOULDER ARTHROSCOPY WITH  SUBACROMIAL DECOMPRESSION, DISTAL CLAVICLE EXCISION, extensive debridement;  Surgeon: Leandrew Koyanagi, MD;  Location: [place];  Service: Orthopedics;  Laterality: Right;   THYMECTOMY  2004   THYROIDECTOMY     Patient Active Problem List   Diagnosis Date Noted   Skin rash 02/17/2021   Preop cardiovascular exam 11/25/2020   Pulmonary hypertension, unspecified ([place]) 11/25/2020   Prediabetes    Hyperlipidemia    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) tear of right shoulder 09/30/2020   Rotator cuff impingement syndrome of right shoulder 09/30/2020   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) 03/18/2020   History of TIA (transient ischemic attack) 02/14/2020   History of stroke    Mixed hyperlipidemia    Essential hypertension    Acute pain of left shoulder 09/08/2017   Impingement syndrome of right shoulder 12/24/2016   Tobacco use 09/28/2016   Glenohumeral arthritis 12/27/2014   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    REFERRING DIAG: Referral diagnosis: Biceps tendonosis of right shoulder [M67.813]  THERAPY DIAG:  Chronic right shoulder pain  Stiffness of right  shoulder, not elsewhere classified  Muscle weakness (generalized)  Localized edema  PERTINENT HISTORY: Hx of debridement and sub acromial decompression x2 (2018 and 2022). Hx of stroke, prediabetes  PRECAUTIONS/RESTRICTIONS:   PRECAUTIONS: Shoulder biceps tenodesis 1/18   WEIGHT BEARING RESTRICTIONS Yes see precautions  SUBJECTIVE:  Pt reports that she has been doing her HEP.  She  is sore as a result.  She feels like her shoulder is slowly improving.  Pain:  Are you having pain? Yes Pain location: Rt shoulder NPRS scale: current 7/10  Aggravating factors: shoulder movement Relieving factors: rest, ice Pain description: intermittent, sharp, and aching Severity: high Irritability: low Stage: Chronic  OBJECTIVE:  UPPER EXTREMITY AROM:   ROM Right 01/06/2022 Left 01/06/2022 Right 01/28/2022  Shoulder flexion   140 52p!  Shoulder abduction       Shoulder internal rotation       Shoulder external rotation       Functional IR       Functional ER       Shoulder extension       Elbow extension       Elbow flexion         (Blank rows = not tested, N = WNL, * = concordant pain with testing)   UPPER EXTREMITY MMT:   MMT Right 01/06/2022 Left 01/06/2022  Shoulder flexion      Shoulder abduction (C5)      Shoulder ER      Shoulder IR      Middle trapezius      Lower trapezius      Shoulder extension      Grip strength      Cervical flexion (C1,C2)      Cervical S/B (C3)      Shoulder shrug (C4)      Elbow flexion (C6)      Elbow ext (C7)      Thumb ext (C8)      Finger abd (T1)      Grossly        (Blank rows = not tested, score listed is out of 5 possible points.  N = WNL, D = diminished, C = clear for gross weakness with myotome testing, * = concordant pain with testing)                UPPER EXTREMITY PROM:   PROM Right 01/06/2022 Left 01/06/2022 Right 01/28/2022  Shoulder flexion 95*   95p! At end range  Shoulder abduction       Shoulder internal rotation       Shoulder external rotation >40 degrees*     Functional IR       Functional ER       Shoulder extension       Elbow extension Full Full   Elbow flexion         (Blank rows = not tested, N = WNL, * = concordant pain with testing)   ASTERISK SIGNS   Asterisk Signs 2/17 2/17 2/28     Shoulder flexion PROM 100 100 (held d/t pain) 110 w/ pain     Shoulder flexion AROM                                   TREATMENT 02/03/2022:  Therapeutic Exercise: - UBE 5' fwd for warm up while taking subjective - AAROM pball roll out on table - 20x - cross body stretch -  16'' x2 - biceps curl - 1# - 2x50 - resisted pronation/supination 2x10 @2 # - L S/L ER - 4x10 - AAROM chest press in supine with dowel - 2x20 (partial ROM) - AAROM flexion - 10x - limited to about 50 degrees  Modalities:  Vasopneumatic (Game Ready)    Location:  right shoulder Time:  10 minutes Pressure:  low Temperature:  44 degrees  [facility] Adult PT Treatment:                                                DATE: 01/28/2022 Therapeutic Exercise: Seated Rt shoulder scaption AAROM with wand 2x10 Seated Rt shoulder ER AAROM with wand 2x10 Seated Rt shoulder pendulums 3x30sec Standing Rt shoulder flexion isometric with 50% force into doorway 2x10 with 5-sec hold Standing Rt shoulder ER isometric with 50% force into doorway 2x10 with 5-sec hold Standing Rt shoulder abduction isometric with 50% force into doorway 2x10 with 5-sec hold Manual Therapy: Rt shoulder flexion PROM with gentle distraction and vibration x8 minutes Neuromuscular re-ed: N/A Therapeutic Activity: N/A Modalities: N/A Self Care: N/A    TREATMENT 01/26/2022:  Therapeutic Exercise: - scapular retraction - 20x - ball squeeze - 2x20 3-4'' - elbow flex ext - 20x2 - wrist pronation/supination - 20x2 - AAROM ER in supine (not beyond 40 degrees) - AAROM chest press in supine with dowel - x20 (partial ROM) - ER isometrics - 2x10 (supine)  Manual Therapy: - forward elevation PROM with gentle oscillations at end range, pt in supine  Modalities:  Vasopneumatic (Game Ready)    Location:  right shoulder Time:  10 minutes Pressure:  low Temperature:  46 degrees   TREATMENT 01/15/2022:  Therapeutic Exercise: - scapular retraction - ball squeeze - elbow flex ext - 20x2 - wrist pronation/supination - 20x - AAROM ER in supine (not  beyond 40 degrees) - Pulley 2' gentle for FF  Manual Therapy: - forward elevation PROM with gentle oscillations at end range, pt in supine  Modalities:  Vasopneumatic (Game Ready)    Location:  right shoulder Time:  10 minutes Pressure:  low Temperature:  46 degrees   HOME EXERCISE PROGRAM: Access Code: [email or web address] URL: [email or web address] Date: 01/15/2022 Prepared by: Shearon Balo  Exercises Forearm Strengthening with Greig Right - 3 x daily - 7 x weekly - 3 sets - 10 reps Seated Shoulder Flexion Towel Slide at Table Top Full Range of Motion - 2 x daily - 7 x weekly - 3 sets - 10 reps Seated Elbow Flexion and Extension AROM - 1 x daily - 7 x weekly - 3 sets - 10 reps Seated Forearm Pronation and Supination AROM - 1 x daily - 7 x weekly - 3 sets - 10 reps  Added 01/28/2022: Shoulder Scaption AAROM with Dowel - 1 x daily - 7 x weekly - 2 sets - 10 reps - 5-sec hold Seated Shoulder External Rotation AAROM with Cane and Hand in Neutral - 1 x daily - 7 x weekly - 2 sets - 10 reps - 5-sec hold Standing Isometric Shoulder External Rotation with Doorway *50% FORCE* - 1 x daily - 7 x weekly - 2 sets - 10 reps - 5-sec hold Standing Isometric Shoulder Flexion with Doorway - Arm Bent - 1 x daily - 7 x weekly - 2 sets - 10 reps - 5-sec hold Standing Isometric Shoulder Abduction with  Doorway - Arm Bent *50% FORCE* - 1 x daily - 7 x weekly - 2 sets - 10 reps - 5-sec hold    ASSESSMENT:   CLINICAL IMPRESSION: Iwalani is progressing fair with therapy.  Pt reports a mild increase in pain following therapy.  Today we concentrated on rotator cuff strengthening, periscapular strengthening, shoulder range of motion, and gentle biceps loading .  Pt continues to demonstrate higher than expected levels of pain which is limiting to typical exercise and ROM progression.  She is progressing, but at a slower rate than expected.  I encouraged her to continue ROM and gentle strengthening at  home; she confirms understaning.  Pt will continue to benefit from skilled physical therapy to address remaining deficits and achieve listed goals.  Continue per POC.     Problem list: Patient presents with pain and impairments/deficits in: shoulder strength and ROM.  Activity limitations include: reaching OH, lifting.  Participation limitations include: ADLs involving reaching, lifting, and use of R arm; work.      GOALS:   SHORT TERM GOALS:   STG Name Target Date Goal status  1 Sahej will be >75% HEP compliant to improve carryover between sessions and facilitate independent management of condition   Baseline: No HEP 01/27/2022 MET 2/21    LONG TERM GOALS:    LTG Name Target Date Goal status  1 Ouida will show a >/= 30 pt improvement in their QUICK DASH score (MCID is 10 pts) as a proxy for functional improvement    Baseline: 86.4 03/03/2022 INITIAL  2 Lisset will demonstrate >120 degrees of active ROM in flexion to allow completion of activities involving reaching [place], not limited by pain   Baseline: 95 degrees passive   03/03/2022 INITIAL  3 Taisa will be able to lift a 2# weight to second shelf x10, not limited by pain   Baseline: limited 03/03/2022 INITIAL  4 Loany will report >/= 50% decrease in pain from evaluation    Baseline: 10/10 max pain, 7/10 average 03/03/2022 INITIAL    PLAN: PT FREQUENCY: 1-2x/week   PT DURATION: 8 weeks (Ending 03/03/2022)   PLANNED INTERVENTIONS: Therapeutic exercises, Therapeutic activity, Neuro Muscular re-education, Gait training, Patient/Family education, Joint mobilization, Dry Needling, Electrical stimulation, Spinal mobilization and/or manipulation, Moist heat, Taping, Vasopneumatic device, Ionotophoresis 4mg /ml Dexamethasone, and Manual therapy   PLAN FOR NEXT SESSION: Progress shoulder P/AROM as able with progressive strengthening as appropriate   Kevan Ny Fate Caster PT 02/03/22 12:12 PM

## 2022-02-04 ENCOUNTER — Telehealth: Payer: Self-pay | Admitting: Orthopaedic Surgery

## 2022-02-04 NOTE — Telephone Encounter (Signed)
Received $25.00 cash,medical records release form from patient/Forwarding to [employer] today 

## 2022-02-05 ENCOUNTER — Ambulatory Visit: Payer: Self-pay | Admitting: Physical Therapy

## 2022-02-05 ENCOUNTER — Encounter: Payer: Self-pay | Admitting: Physical Therapy

## 2022-02-05 ENCOUNTER — Other Ambulatory Visit: Payer: Self-pay

## 2022-02-05 DIAGNOSIS — G8929 Other chronic pain: Secondary | ICD-10-CM

## 2022-02-05 DIAGNOSIS — M25611 Stiffness of right shoulder, not elsewhere classified: Secondary | ICD-10-CM

## 2022-02-05 DIAGNOSIS — M6281 Muscle weakness (generalized): Secondary | ICD-10-CM

## 2022-02-05 DIAGNOSIS — R600 Localized edema: Secondary | ICD-10-CM

## 2022-02-05 NOTE — Therapy (Signed)
OUTPATIENT PHYSICAL THERAPY TREATMENT NOTE   Patient Name: Meagan Flores MRN: [id number] DOB:[birth year], 56 y.o., female Today's Date: 02/05/2022  PCP: Elsie Stain, MD REFERRING PROVIDER: Nathaniel Man   PT End of Session - 02/05/22 0914     Visit Number 7    Number of Visits 16    Date for PT Re-Evaluation 03/03/22    Authorization Type self pay    PT Start Time 0915    PT Stop Time 0953   +10 min vaso   PT Time Calculation (min) 38 min    Activity Tolerance Patient tolerated treatment well    Behavior During Therapy [facility] for tasks assessed/performed              Past Medical History:  Diagnosis Date   Acute pain of left shoulder 09/08/2017   Aneurysm ([place]) 03/18/2020   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   COVID-19 virus infection 02/14/2020   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) tear of right shoulder 09/30/2020   Essential hypertension    Glenohumeral arthritis 12/27/2014   History of stroke    History of TIA (transient ischemic attack) 02/14/2020   Hyperlipidemia    Hypothyroidism    Impingement syndrome of right shoulder 12/24/2016   Mixed hyperlipidemia    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   Prediabetes    Preop cardiovascular exam 11/25/2020   Pulmonary hypertension, unspecified ([place]) 11/25/2020   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   Rotator cuff impingement syndrome of right shoulder 09/30/2020   Skin rash 02/17/2021   Stroke [facility])    Tendinopathy of right biceps tendon 09/30/2020   Thyroid disease    TIA (transient ischemic attack) 04/15/2018   Tobacco use 09/28/2016   Past Surgical History:  Procedure Laterality Date   BICEPT TENODESIS Right 12/16/2021   Procedure: RIGHT BICEPS TENODESIS;  Surgeon: Leandrew Koyanagi, MD;  Location: [place];  Service: Orthopedics;  Laterality: Right;   PARTIAL HYSTERECTOMY     right arm sx     SHOULDER ARTHROSCOPY WITH BICEPSTENOTOMY Right 01/21/2021    Procedure: SHOULDER ARTHROSCOPY WITH BICEPSTENOTOMY;  Surgeon: Leandrew Koyanagi, MD;  Location: [place];  Service: Orthopedics;  Laterality: Right;   SHOULDER ARTHROSCOPY WITH DISTAL CLAVICLE RESECTION Right 01/12/2017   Procedure: RIGHT SHOULDER ARTHROSCOPY WITH  SUBACROMIAL DECOMPRESSION, DISTAL CLAVICLE EXCISION, extensive debridement;  Surgeon: Leandrew Koyanagi, MD;  Location: [place];  Service: Orthopedics;  Laterality: Right;   THYMECTOMY  2004   THYROIDECTOMY     Patient Active Problem List   Diagnosis Date Noted   Skin rash 02/17/2021   Preop cardiovascular exam 11/25/2020   Pulmonary hypertension, unspecified ([place]) 11/25/2020   Prediabetes    Hyperlipidemia    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) tear of right shoulder 09/30/2020   Rotator cuff impingement syndrome of right shoulder 09/30/2020   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   Tendinopathy of right biceps tendon 09/30/2020   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   Aneurysm ([place]) 03/18/2020   History of TIA (transient ischemic attack) 02/14/2020   History of stroke    Mixed hyperlipidemia    Essential hypertension    Acute pain of left shoulder 09/08/2017   Impingement syndrome of right shoulder 12/24/2016   Tobacco use 09/28/2016   Glenohumeral arthritis 12/27/2014   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    REFERRING DIAG: Referral diagnosis: Biceps tendonosis of right shoulder [M67.813]  THERAPY DIAG:  Chronic right shoulder pain  Stiffness of right shoulder, not  elsewhere classified  Muscle weakness (generalized)  Localized edema  PERTINENT HISTORY: Hx of debridement and sub acromial decompression x2 (2018 and 2022). Hx of stroke, prediabetes  PRECAUTIONS/RESTRICTIONS:   PRECAUTIONS: Shoulder biceps tenodesis 1/18   WEIGHT BEARING RESTRICTIONS Yes see precautions  SUBJECTIVE:  Pt reports that she continues to have high levels of pain.  She  reports that her L shoulder is sore.  Pain:  Are you having pain? Yes Pain location: Rt shoulder NPRS scale: current 5/10  Aggravating factors: shoulder movement Relieving factors: rest, ice Pain description: intermittent, sharp, and aching Severity: high Irritability: low Stage: Chronic  OBJECTIVE:  UPPER EXTREMITY AROM:   ROM Right 01/06/2022 Left 01/06/2022 Right 01/28/2022  Shoulder flexion   140 52p!  Shoulder abduction       Shoulder internal rotation       Shoulder external rotation       Functional IR       Functional ER       Shoulder extension       Elbow extension       Elbow flexion         (Blank rows = not tested, N = WNL, * = concordant pain with testing)   UPPER EXTREMITY MMT:   MMT Right 01/06/2022 Left 01/06/2022  Shoulder flexion      Shoulder abduction (C5)      Shoulder ER      Shoulder IR      Middle trapezius      Lower trapezius      Shoulder extension      Grip strength      Cervical flexion (C1,C2)      Cervical S/B (C3)      Shoulder shrug (C4)      Elbow flexion (C6)      Elbow ext (C7)      Thumb ext (C8)      Finger abd (T1)      Grossly        (Blank rows = not tested, score listed is out of 5 possible points.  N = WNL, D = diminished, C = clear for gross weakness with myotome testing, * = concordant pain with testing)                UPPER EXTREMITY PROM:   PROM Right 01/06/2022 Left 01/06/2022 Right 01/28/2022  Shoulder flexion 95*   95p! At end range  Shoulder abduction       Shoulder internal rotation       Shoulder external rotation >40 degrees*     Functional IR       Functional ER       Shoulder extension       Elbow extension Full Full   Elbow flexion         (Blank rows = not tested, N = WNL, * = concordant pain with testing)   ASTERISK SIGNS   Asterisk Signs 2/17 2/17 2/28 3/10    Shoulder flexion PROM 100 100 (held d/t pain) 110 w/ pain     Shoulder flexion AROM        Shoulder AAROM    90                       TREATMENT 02/05/2022:  Therapeutic Exercise: - UBE 5' fwd for warm up while taking subjective - cross body stretch - 45'' x2 - biceps curl - 1# - 2x50 - UE ranger for flexion/scaption -  30 in - 20x - L S/L ER - 4x10 - 1# - AAROM flexion  with P ball - 2x15 - limited to about 50 degrees  Manual therapy: - PROM flexion and gentle ER - G II AP mob  Modalities:  Vasopneumatic (Game Ready)    Location:  right shoulder Time:  10 minutes Pressure:  low Temperature:  44 degrees     HOME EXERCISE PROGRAM: Access Code: [email or web address] URL: [email or web address] Date: 01/15/2022 Prepared by: Shearon Balo  Exercises Forearm Strengthening with Greig Right - 3 x daily - 7 x weekly - 3 sets - 10 reps Seated Shoulder Flexion Towel Slide at Table Top Full Range of Motion - 2 x daily - 7 x weekly - 3 sets - 10 reps Seated Elbow Flexion and Extension AROM - 1 x daily - 7 x weekly - 3 sets - 10 reps Seated Forearm Pronation and Supination AROM - 1 x daily - 7 x weekly - 3 sets - 10 reps  Added 01/28/2022: Shoulder Scaption AAROM with Dowel - 1 x daily - 7 x weekly - 2 sets - 10 reps - 5-sec hold Seated Shoulder External Rotation AAROM with Cane and Hand in Neutral - 1 x daily - 7 x weekly - 2 sets - 10 reps - 5-sec hold Standing Isometric Shoulder External Rotation with Doorway *50% FORCE* - 1 x daily - 7 x weekly - 2 sets - 10 reps - 5-sec hold Standing Isometric Shoulder Flexion with Doorway - Arm Bent - 1 x daily - 7 x weekly - 2 sets - 10 reps - 5-sec hold Standing Isometric Shoulder Abduction with Doorway - Arm Bent *50% FORCE* - 1 x daily - 7 x weekly - 2 sets - 10 reps - 5-sec hold    ASSESSMENT:   CLINICAL IMPRESSION: Khya is progressing fair with therapy.  Pt reports no increase in baseline pain following therapy.  Today we concentrated on rotator cuff strengthening, periscapular strengthening, and shoulder range of motion.  Pt remains limited by high levels of  pain, but is progressing slowly with strengthening.  She is frustrated by her pain.  She was able to get to 90 degrees of AAROM today.  Pt will continue to benefit from skilled physical therapy to address remaining deficits and achieve listed goals.  Continue per POC.     Problem list: Patient presents with pain and impairments/deficits in: shoulder strength and ROM.  Activity limitations include: reaching OH, lifting.  Participation limitations include: ADLs involving reaching, lifting, and use of R arm; work.      GOALS:   SHORT TERM GOALS:   STG Name Target Date Goal status  1 Soo will be >75% HEP compliant to improve carryover between sessions and facilitate independent management of condition   Baseline: No HEP 01/27/2022 MET 2/21    LONG TERM GOALS:    LTG Name Target Date Goal status  1 Javonda will show a >/= 30 pt improvement in their QUICK DASH score (MCID is 10 pts) as a proxy for functional improvement    Baseline: 86.4 03/03/2022 INITIAL  2 Saydee will demonstrate >120 degrees of active ROM in flexion to allow completion of activities involving reaching OH, not limited by pain   Baseline: 95 degrees passive   03/03/2022 INITIAL  3 Araina will be able to lift a 2# weight to second shelf x10, not limited by pain   Baseline: limited 03/03/2022 INITIAL  4 [place] will report >/= 50% decrease in pain from  evaluation    Baseline: 10/10 max pain, 7/10 average 03/03/2022 INITIAL    PLAN: PT FREQUENCY: 1-2x/week   PT DURATION: 8 weeks (Ending 03/03/2022)   PLANNED INTERVENTIONS: Therapeutic exercises, Therapeutic activity, Neuro Muscular re-education, Gait training, Patient/Family education, Joint mobilization, Dry Needling, Electrical stimulation, Spinal mobilization and/or manipulation, Moist heat, Taping, Vasopneumatic device, Ionotophoresis 4mg /ml Dexamethasone, and Manual therapy   PLAN FOR NEXT SESSION: Progress shoulder P/AROM as able with progressive strengthening as  appropriate   Mathis Dad PT 02/05/22 9:54 AM

## 2022-02-06 NOTE — Therapy (Incomplete)
OUTPATIENT PHYSICAL THERAPY TREATMENT NOTE   Patient Name: Meagan Flores MRN: [id number] DOB:01/11/1966, 56 y.o., female Today's Date: 02/06/2022  PCP: Elsie Stain, MD REFERRING PROVIDER: Aundra Dubin, PA-C      Past Medical History:  Diagnosis Date   Acute pain of left shoulder 09/08/2017   Aneurysm ([place]) 03/18/2020   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   COVID-19 virus infection 02/14/2020   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) tear of right shoulder 09/30/2020   Essential hypertension    Glenohumeral arthritis 12/27/2014   History of stroke    History of TIA (transient ischemic attack) 02/14/2020   Hyperlipidemia    Hypothyroidism    Impingement syndrome of right shoulder 12/24/2016   Mixed hyperlipidemia    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   Prediabetes    Preop cardiovascular exam 11/25/2020   Pulmonary hypertension, unspecified ([place]) 11/25/2020   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   Rotator cuff impingement syndrome of right shoulder 09/30/2020   Skin rash 02/17/2021   Stroke [facility])    Tendinopathy of right biceps tendon 09/30/2020   Thyroid disease    TIA (transient ischemic attack) 04/15/2018   Tobacco use 09/28/2016   Past Surgical History:  Procedure Laterality Date   BICEPT TENODESIS Right 12/16/2021   Procedure: RIGHT BICEPS TENODESIS;  Surgeon: Leandrew Koyanagi, MD;  Location: [place];  Service: Orthopedics;  Laterality: Right;   PARTIAL HYSTERECTOMY     right arm sx     SHOULDER ARTHROSCOPY WITH BICEPSTENOTOMY Right 01/21/2021   Procedure: SHOULDER ARTHROSCOPY WITH BICEPSTENOTOMY;  Surgeon: Leandrew Koyanagi, MD;  Location: [place];  Service: Orthopedics;  Laterality: Right;   SHOULDER ARTHROSCOPY WITH DISTAL CLAVICLE RESECTION Right 01/12/2017   Procedure: RIGHT SHOULDER ARTHROSCOPY WITH  SUBACROMIAL DECOMPRESSION, DISTAL CLAVICLE EXCISION, extensive debridement;  Surgeon: Leandrew Koyanagi,  MD;  Location: [place];  Service: Orthopedics;  Laterality: Right;   THYMECTOMY  2004   THYROIDECTOMY     Patient Active Problem List   Diagnosis Date Noted   Skin rash 02/17/2021   Preop cardiovascular exam 11/25/2020   Pulmonary hypertension, unspecified ([place]) 11/25/2020   Prediabetes    Hyperlipidemia    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) tear of right shoulder 09/30/2020   Rotator cuff impingement syndrome of right shoulder 09/30/2020   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   Tendinopathy of right biceps tendon 09/30/2020   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   Aneurysm ([place]) 03/18/2020   History of TIA (transient ischemic attack) 02/14/2020   History of stroke    Mixed hyperlipidemia    Essential hypertension    Acute pain of left shoulder 09/08/2017   Impingement syndrome of right shoulder 12/24/2016   Tobacco use 09/28/2016   Glenohumeral arthritis 12/27/2014   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    REFERRING DIAG: Referral diagnosis: Biceps tendonosis of right shoulder [M67.813]  THERAPY DIAG:  No diagnosis found.  PERTINENT HISTORY: Hx of debridement and sub acromial decompression x2 (2018 and 2022). Hx of stroke, prediabetes  PRECAUTIONS/RESTRICTIONS:   PRECAUTIONS: Shoulder biceps tenodesis 1/18   WEIGHT BEARING RESTRICTIONS Yes see precautions  SUBJECTIVE: *** Pt reports that she continues to have high levels of pain.  She reports that her L shoulder is sore.  Pain:  Are you having pain? Yes Pain location: Rt shoulder NPRS scale: current ***/10  Aggravating factors: shoulder movement Relieving factors: rest, ice Pain description: intermittent, sharp, and aching Severity: high Irritability: low Stage: Chronic  OBJECTIVE:  UPPER  EXTREMITY AROM:   ROM Right 01/06/2022 Left 01/06/2022 Right 01/28/2022  Shoulder flexion   140 52p!  Shoulder abduction       Shoulder internal rotation        Shoulder external rotation       Functional IR       Functional ER       Shoulder extension       Elbow extension       Elbow flexion         (Blank rows = not tested, N = WNL, * = concordant pain with testing)   UPPER EXTREMITY MMT:   MMT Right 01/06/2022 Left 01/06/2022  Shoulder flexion      Shoulder abduction (C5)      Shoulder ER      Shoulder IR      Middle trapezius      Lower trapezius      Shoulder extension      Grip strength      Cervical flexion (C1,C2)      Cervical S/B (C3)      Shoulder shrug (C4)      Elbow flexion (C6)      Elbow ext (C7)      Thumb ext (C8)      Finger abd (T1)      Grossly        (Blank rows = not tested, score listed is out of 5 possible points.  N = WNL, D = diminished, C = clear for gross weakness with myotome testing, * = concordant pain with testing)                UPPER EXTREMITY PROM:   PROM Right 01/06/2022 Left 01/06/2022 Right 01/28/2022 Right 02/09/2022  Shoulder flexion 95*   95p! At end range   Shoulder abduction        Shoulder internal rotation        Shoulder external rotation >40 degrees*      Functional IR        Functional ER        Shoulder extension        Elbow extension Full Full    Elbow flexion          (Blank rows = not tested, N = WNL, * = concordant pain with testing)   ASTERISK SIGNS   Asterisk Signs 2/17 2/17 2/28 3/10 3/14   Shoulder flexion PROM 100 100 (held d/t pain) 110 w/ pain     Shoulder flexion AROM        Shoulder AAROM    90                      TREATMENT  [facility] Adult PT Treatment:                                                DATE: 02/09/2022 Therapeutic Exercise: UBE 5' fwd for warm up while taking subjective cross body stretch - 45'' x2 biceps curl - 1# - 2x50 UE ranger for flexion/scaption - 30 in - 20x L S/L ER - 4x10 - 1# AAROM flexion with P ball - 2x15 - limited to about 50 degrees Standing shoulder scaption with dowel 5" hold 2x10 Forearm pronation/supination 1#  2x20 Manual therapy: PROM flexion and gentle ER G II AP mob Modalities: Vasopneumatic (  Game Ready)   Location:  right shoulder Time:  10 minutes Pressure:  low Temperature:  44 degrees   02/05/2022:  Therapeutic Exercise: - UBE 5' fwd for warm up while taking subjective - cross body stretch - 62'' x2 - biceps curl - 1# - 2x50 - UE ranger for flexion/scaption - 30 in - 20x - L S/L ER - 4x10 - 1# - AAROM flexion  with P ball - 2x15 - limited to about 50 degrees  Manual therapy: - PROM flexion and gentle ER - G II AP mob  Modalities:  Vasopneumatic (Game Ready)    Location:  right shoulder Time:  10 minutes Pressure:  low Temperature:  44 degrees     HOME EXERCISE PROGRAM: Access Code: [email or web address] URL: [email or web address] Date: 01/15/2022 Prepared by: Shearon Balo  Exercises Forearm Strengthening with Greig Right - 3 x daily - 7 x weekly - 3 sets - 10 reps Seated Shoulder Flexion Towel Slide at Table Top Full Range of Motion - 2 x daily - 7 x weekly - 3 sets - 10 reps Seated Elbow Flexion and Extension AROM - 1 x daily - 7 x weekly - 3 sets - 10 reps Seated Forearm Pronation and Supination AROM - 1 x daily - 7 x weekly - 3 sets - 10 reps  Added 01/28/2022: Shoulder Scaption AAROM with Dowel - 1 x daily - 7 x weekly - 2 sets - 10 reps - 5-sec hold Seated Shoulder External Rotation AAROM with Cane and Hand in Neutral - 1 x daily - 7 x weekly - 2 sets - 10 reps - 5-sec hold Standing Isometric Shoulder External Rotation with Doorway *50% FORCE* - 1 x daily - 7 x weekly - 2 sets - 10 reps - 5-sec hold Standing Isometric Shoulder Flexion with Doorway - Arm Bent - 1 x daily - 7 x weekly - 2 sets - 10 reps - 5-sec hold Standing Isometric Shoulder Abduction with Doorway - Arm Bent *50% FORCE* - 1 x daily - 7 x weekly - 2 sets - 10 reps - 5-sec hold    ASSESSMENT:   CLINICAL IMPRESSION: ***  Neely is progressing fair with therapy.  Pt reports no  increase in baseline pain following therapy.  Today we concentrated on rotator cuff strengthening, periscapular strengthening, and shoulder range of motion.  Pt remains limited by high levels of pain, but is progressing slowly with strengthening.  She is frustrated by her pain.  She was able to get to 90 degrees of AAROM today.  Pt will continue to benefit from skilled physical therapy to address remaining deficits and achieve listed goals.  Continue per POC.     Problem list: Patient presents with pain and impairments/deficits in: shoulder strength and ROM.  Activity limitations include: reaching OH, lifting.  Participation limitations include: ADLs involving reaching, lifting, and use of R arm; work.      GOALS:   SHORT TERM GOALS:   STG Name Target Date Goal status  1 Missy will be >75% HEP compliant to improve carryover between sessions and facilitate independent management of condition   Baseline: No HEP 01/27/2022 MET 2/21    LONG TERM GOALS:    LTG Name Target Date Goal status  1 Kachina will show a >/= 30 pt improvement in their QUICK DASH score (MCID is 10 pts) as a proxy for functional improvement    Baseline: 86.4 03/03/2022 INITIAL  2 Tarita will demonstrate >120 degrees of active ROM in flexion to  allow completion of activities involving reaching [place], not limited by pain   Baseline: 95 degrees passive   03/03/2022 INITIAL  3 Khristie will be able to lift a 2# weight to second shelf x10, not limited by pain   Baseline: limited 03/03/2022 INITIAL  4 Ruble will report >/= 50% decrease in pain from evaluation    Baseline: 10/10 max pain, 7/10 average 03/03/2022 INITIAL    PLAN: PT FREQUENCY: 1-2x/week   PT DURATION: 8 weeks (Ending 03/03/2022)   PLANNED INTERVENTIONS: Therapeutic exercises, Therapeutic activity, Neuro Muscular re-education, Gait training, Patient/Family education, Joint mobilization, Dry Needling, Electrical stimulation, Spinal mobilization and/or manipulation, Moist heat,  Taping, Vasopneumatic device, Ionotophoresis 4mg /ml Dexamethasone, and Manual therapy   PLAN FOR NEXT SESSION: Progress shoulder P/AROM as able with progressive strengthening as appropriate   Evelene Croon PTA 02/06/22 10:12 AM

## 2022-02-09 ENCOUNTER — Other Ambulatory Visit: Payer: Self-pay

## 2022-02-09 ENCOUNTER — Ambulatory Visit: Payer: Self-pay

## 2022-02-09 DIAGNOSIS — R600 Localized edema: Secondary | ICD-10-CM

## 2022-02-09 DIAGNOSIS — G8929 Other chronic pain: Secondary | ICD-10-CM

## 2022-02-09 DIAGNOSIS — M6281 Muscle weakness (generalized): Secondary | ICD-10-CM

## 2022-02-09 DIAGNOSIS — M25611 Stiffness of right shoulder, not elsewhere classified: Secondary | ICD-10-CM

## 2022-02-10 ENCOUNTER — Ambulatory Visit (INDEPENDENT_AMBULATORY_CARE_PROVIDER_SITE_OTHER): Payer: Self-pay | Admitting: Orthopaedic Surgery

## 2022-02-10 ENCOUNTER — Encounter: Payer: Self-pay | Admitting: Orthopaedic Surgery

## 2022-02-10 DIAGNOSIS — M67813 Other specified disorders of tendon, right shoulder: Secondary | ICD-10-CM

## 2022-02-10 DIAGNOSIS — Z9889 Other specified postprocedural states: Secondary | ICD-10-CM

## 2022-02-10 NOTE — Progress Notes (Signed)
? ?Post-Op Visit Note ?  ?Patient: Meagan Flores           ?Date of Birth: 08-27-1966           ?MRN: [id number] ?Visit Date: 02/10/2022 ?PCP: Meagan Stain, MD ? ? ?Assessment & Plan: ? ?Chief Complaint:  ?Chief Complaint  ?Patient presents with  ? Right Shoulder - Pain  ? ?Visit Diagnoses:  ?1. Biceps tendonosis of right shoulder   ?2. S/P arthroscopy of right shoulder   ? ? ?Plan: [name] is 6 weeks status post biceps tenodesis of the right shoulder.  Currently doing physical therapy twice a week.  She has some pain around the surgical scar.  She feels that the surgery has helped.  She is currently lifting a 1 pound weight PT. ? ?Examination right shoulder shows fully healed surgical scars.  Forward flexion to 90 degrees abduction 95 degrees external rotation to the right ear.  Passive range of motion shows moderate guarding. ? ?Overall Meagan Flores is recovering well.  She will continue to advance through PT.  Lifting and weightbearing as tolerated.  Follow-up in 6 weeks for recheck. ? ?Follow-Up Instructions: Return in about 6 weeks (around 03/24/2022).  ? ?Orders:  ?No orders of the defined types were placed in this encounter. ? ?No orders of the defined types were placed in this encounter. ? ? ?Imaging: ?No results found. ? ?PMFS History: ?Patient Active Problem List  ? Diagnosis Date Noted  ? Biceps tendonosis of right shoulder 02/10/2022  ? S/P arthroscopy of right shoulder 02/10/2022  ? Skin rash 02/17/2021  ? Preop cardiovascular exam 11/25/2020  ? Pulmonary hypertension, unspecified ([place]) 11/25/2020  ? Prediabetes   ? Hyperlipidemia   ? 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) tear of right shoulder 09/30/2020  ? Rotator cuff impingement syndrome of right shoulder 09/30/2020  ? 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 Tendinopathy of right biceps tendon 09/30/2020  ? 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  ? Aneurysm ([place]) 03/18/2020  ? History of TIA (transient ischemic attack) 02/14/2020   ? History of stroke   ? Mixed hyperlipidemia   ? Essential hypertension   ? Acute pain of left shoulder 09/08/2017  ? Impingement syndrome of right shoulder 12/24/2016  ? Tobacco use 09/28/2016  ? Glenohumeral arthritis 12/27/2014  ? 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 ?Past Medical History:  ?Diagnosis Date  ? Acute pain of left shoulder 09/08/2017  ? Aneurysm ([place]) 03/18/2020  ? 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 COVID-19 virus infection 02/14/2020  ? 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) tear of right shoulder 09/30/2020  ? Essential hypertension   ? Glenohumeral arthritis 12/27/2014  ? History of stroke   ? History of TIA (transient ischemic attack) 02/14/2020  ? Hyperlipidemia   ? Hypothyroidism   ? Impingement syndrome of right shoulder 12/24/2016  ? Mixed hyperlipidemia   ? 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 Prediabetes   ? Preop cardiovascular exam 11/25/2020  ? Pulmonary hypertension, unspecified ([place]) 11/25/2020  ? 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  ? Rotator cuff impingement syndrome of right shoulder 09/30/2020  ? Skin rash 02/17/2021  ? Stroke [facility])   ? Tendinopathy of right biceps tendon 09/30/2020  ? Thyroid disease   ? TIA (transient ischemic attack) 04/15/2018  ? Tobacco use 09/28/2016  ?  ?Family History  ?Problem Relation Age of Onset  ? Stomach cancer Sister   ? Colon polyps Sister   ? Esophageal cancer Neg Hx   ? Rectal cancer Neg Hx   ?  Colon cancer Neg Hx   ? Breast cancer Neg Hx   ?  ?Past Surgical History:  ?Procedure Laterality Date  ? BICEPT TENODESIS Right 12/16/2021  ? Procedure: RIGHT BICEPS TENODESIS;  Surgeon: Leandrew Koyanagi, MD;  Location: [place];  Service: Orthopedics;  Laterality: Right;  ? PARTIAL HYSTERECTOMY    ? right arm sx    ? SHOULDER ARTHROSCOPY WITH BICEPSTENOTOMY Right 01/21/2021  ? Procedure: SHOULDER ARTHROSCOPY WITH BICEPSTENOTOMY;  Surgeon: Leandrew Koyanagi, MD;  Location: [place];  Service:  Orthopedics;  Laterality: Right;  ? SHOULDER ARTHROSCOPY WITH DISTAL CLAVICLE RESECTION Right 01/12/2017  ? Procedure: RIGHT SHOULDER ARTHROSCOPY WITH  SUBACROMIAL DECOMPRESSION, DISTAL CLAVICLE EXCISION, extensive debridement;  Surgeon: Leandrew Koyanagi, MD;  Location: [place];  Service: Orthopedics;  Laterality: Right;  ? THYMECTOMY  2004  ? THYROIDECTOMY    ? ?Social History  ? ?Occupational History  ? Not on file  ?Tobacco Use  ? Smoking status: Every Day  ?  Packs/day: 0.50  ?  Years: 34.00  ?  Pack years: 17.00  ?  Types: Cigarettes  ? Smokeless tobacco: Never  ?Vaping Use  ? Vaping Use: Never used  ?Substance and Sexual Activity  ? Alcohol use: Yes  ?  Comment: occassional  ? Drug use: No  ? Sexual activity: Not on file  ? ? ? ?

## 2022-02-10 NOTE — Therapy (Signed)
?OUTPATIENT PHYSICAL THERAPY TREATMENT NOTE ? ? ?Patient Name: Meagan Flores ?MRN: [id number] ?DOB:June 28, 1966, 56 y.o., female ?Today's Date: 02/11/2022 ? ?PCP: Elsie Stain, MD ?REFERRING PROVIDER: Aundra Dubin, PA-C ? ? PT End of Session - 02/11/22 0908   ? ? Visit Number [id number]   ? Number of Visits 16   ? Date for PT Re-Evaluation 03/03/22   ? Authorization Type self pay   ? PT Start Time 0908   ? PT Stop Time [phone]   ? PT Time Calculation (min) 44 min   ? Activity Tolerance Patient tolerated treatment well;Patient limited by pain   ? Behavior During Therapy [facility] for tasks assessed/performed   ? ?  ?  ? ?  ? ? ? ? ? ?Past Medical History:  ?Diagnosis Date  ? Acute pain of left shoulder 09/08/2017  ? Aneurysm ([place]) 03/18/2020  ? 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 COVID-19 virus infection 02/14/2020  ? 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) tear of right shoulder 09/30/2020  ? Essential hypertension   ? Glenohumeral arthritis 12/27/2014  ? History of stroke   ? History of TIA (transient ischemic attack) 02/14/2020  ? Hyperlipidemia   ? Hypothyroidism   ? Impingement syndrome of right shoulder 12/24/2016  ? Mixed hyperlipidemia   ? 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 Prediabetes   ? Preop cardiovascular exam 11/25/2020  ? Pulmonary hypertension, unspecified ([place]) 11/25/2020  ? 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  ? Rotator cuff impingement syndrome of right shoulder 09/30/2020  ? Skin rash 02/17/2021  ? Stroke [facility])   ? Tendinopathy of right biceps tendon 09/30/2020  ? Thyroid disease   ? TIA (transient ischemic attack) 04/15/2018  ? Tobacco use 09/28/2016  ? ?Past Surgical History:  ?Procedure Laterality Date  ? BICEPT TENODESIS Right 12/16/2021  ? Procedure: RIGHT BICEPS TENODESIS;  Surgeon: Leandrew Koyanagi, MD;  Location: [place];  Service: Orthopedics;  Laterality: Right;  ? PARTIAL HYSTERECTOMY    ? right arm sx    ? SHOULDER ARTHROSCOPY WITH BICEPSTENOTOMY Right  01/21/2021  ? Procedure: SHOULDER ARTHROSCOPY WITH BICEPSTENOTOMY;  Surgeon: Leandrew Koyanagi, MD;  Location: [place];  Service: Orthopedics;  Laterality: Right;  ? SHOULDER ARTHROSCOPY WITH DISTAL CLAVICLE RESECTION Right 01/12/2017  ? Procedure: RIGHT SHOULDER ARTHROSCOPY WITH  SUBACROMIAL DECOMPRESSION, DISTAL CLAVICLE EXCISION, extensive debridement;  Surgeon: Leandrew Koyanagi, MD;  Location: [place];  Service: Orthopedics;  Laterality: Right;  ? THYMECTOMY  2004  ? THYROIDECTOMY    ? ?Patient Active Problem List  ? Diagnosis Date Noted  ? Biceps tendonosis of right shoulder 02/10/2022  ? S/P arthroscopy of right shoulder 02/10/2022  ? Skin rash 02/17/2021  ? Preop cardiovascular exam 11/25/2020  ? Pulmonary hypertension, unspecified ([place]) 11/25/2020  ? Prediabetes   ? Hyperlipidemia   ? 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) tear of right shoulder 09/30/2020  ? Rotator cuff impingement syndrome of right shoulder 09/30/2020  ? 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 Tendinopathy of right biceps tendon 09/30/2020  ? 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  ? Aneurysm ([place]) 03/18/2020  ? History of TIA (transient ischemic attack) 02/14/2020  ? History of stroke   ? Mixed hyperlipidemia   ? Essential hypertension   ? Acute pain of left shoulder 09/08/2017  ? Impingement syndrome of right shoulder 12/24/2016  ? Tobacco use 09/28/2016  ? Glenohumeral arthritis 12/27/2014  ? 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 ? ?REFERRING DIAG: Referral diagnosis: Biceps tendonosis of  right shoulder [M67.813] ? ?THERAPY DIAG:  ?Chronic right shoulder pain ? ?Stiffness of right shoulder, not elsewhere classified ? ?Muscle weakness (generalized) ? ?Localized edema ? ?PERTINENT HISTORY: Hx of debridement and sub acromial decompression x2 (2018 and 2022). Hx of stroke, prediabetes ? ?PRECAUTIONS/RESTRICTIONS:  ? ?PRECAUTIONS: Shoulder biceps tenodesis 1/18 ?  ?WEIGHT BEARING  RESTRICTIONS Yes see precautions ? ?SUBJECTIVE: Patient states she went to the doctor yesterday and told her that it's going to take time for the pain to go away. ? ?Pain:  ?Are you having pain? Yes ?Pain location: Rt shoulder ?NPRS scale: current 7/10  ?Aggravating factors: shoulder movement ?Relieving factors: rest, ice ?Pain description: intermittent, sharp, and aching ?Severity: high ?Irritability: low ?Stage: Chronic ? ?OBJECTIVE: ? ?UPPER EXTREMITY AROM: ?  ?ROM Right ?01/06/2022 Left ?01/06/2022 Right ?01/28/2022  ?Shoulder flexion   140 52p!  ?Shoulder abduction       ?Shoulder internal rotation       ?Shoulder external rotation       ?Functional IR       ?Functional ER       ?Shoulder extension       ?Elbow extension       ?Elbow flexion       ?  ?(Blank rows = not tested, N = WNL, * = concordant pain with testing) ?  ?UPPER EXTREMITY MMT: ?  ?MMT Right ?01/06/2022 Left ?01/06/2022  ?Shoulder flexion      ?Shoulder abduction (C5)      ?Shoulder ER      ?Shoulder IR      ?Middle trapezius      ?Lower trapezius      ?Shoulder extension      ?Grip strength      ?Cervical flexion (C1,C2)      ?Cervical S/B (C3)      ?Shoulder shrug (C4)      ?Elbow flexion (C6)      ?Elbow ext (C7)      ?Thumb ext (C8)      ?Finger abd (T1)      ?Grossly      ?  ?(Blank rows = not tested, score listed is out of 5 possible points.  N = WNL, D = diminished, C = clear for gross weakness with myotome testing, * = concordant pain with testing) ?  ?           ?  ?UPPER EXTREMITY PROM: ?  ?PROM Right ?01/06/2022 Left ?01/06/2022 Right ?01/28/2022 Right ?02/09/2022 Right ?02/11/2022  ?Shoulder flexion 95*   95p! At end range 85p! 97p!  ?Shoulder abduction         ?Shoulder internal rotation         ?Shoulder external rotation >40 degrees*       ?Functional IR         ?Functional ER         ?Shoulder extension         ?Elbow extension Full Full     ?Elbow flexion         ?  ?(Blank rows = not tested, N = WNL, * = concordant pain with  testing) ? ? ?ASTERISK SIGNS ? ? ?Asterisk Signs 2/17 2/17 2/28 3/10 3/14 3/16  ?Shoulder flexion PROM 100 100 (held d/t pain) 110 w/ pain  85 97  ?Shoulder flexion AROM     62 60  ?Shoulder AAROM    90    ?        ?        ? ? ?  TREATMENT  ?[facility] Adult PT Treatment:                                                DATE: 02/11/2022 ?Therapeutic Exercise: ?UBE level 2 x5 min fwd for warm up while taking subjective ?cross body stretch - 30'' x2 ?biceps curl - 1# - 2x35 ?L S/L ER - 3x10 - 1# ?AAROM flexion with P ball - 2x10 - limited to about 50-60 degrees ?Forearm pronation/supination 1# 2x25 ?Shoulder flexion Pball rollout 5" hold x10 ?Shoulder scaption Pball rollout 5" hold x10 ?Manual therapy: x8 mins ?PROM flexion and gentle ER ?Grade II AP mobs ?Modalities: ?Ice in sitting to R shoulder x1mns post session ? ? [facility] ?Therapeutic Exercise: ?UBE 5' x5 min fwd for warm up while taking subjective ?cross body stretch - 30'' x2 ?biceps curl - 1# - 2x30 ?L S/L ER - 3x10 - 1# ?AAROM flexion with P ball - 2x10 - limited to about 50-60 degrees ?Forearm pronation/supination 1# 2x20 ?Manual therapy: x10 mins ?PROM flexion and gentle ER ?Modalities: ?Vasopneumatic (Game Ready)   ?Location:  right shoulder ?Time:  10 minutes ?Pressure:  low ?Temperature:  44 degrees ? ? ?02/05/2022: ?Therapeutic Exercise: ?- UBE 5' fwd for warm up while taking subjective ?- cross body stretch - 45'' x2 ?- biceps curl - 1# - 2x50 ?- UE ranger for flexion/scaption - 30 in - 20x ?- L S/L ER - 4x10 - 1# ?- AAROM flexion  with P ball - 2x15 - limited to about 50 degrees ? ?Manual therapy: ?- PROM flexion and gentle ER ?- G II AP mob ? ?Modalities: ? ?Vasopneumatic (Game Ready)   ? ?Location:  right shoulder ?Time:  10 minutes ?Pressure:  low ?Temperature:  44 degrees ? ? ? ? ?HOME EXERCISE PROGRAM: ?Access Code: [email or web address]?URL: [email or web address] ?Date:  01/15/2022 ?Prepared by: KShearon Balo? ?Exercises ?Forearm Strengthening with Ball Squeeze - 3 x daily - 7 x weekly - 3 sets - 10 reps ?Seated Shoulder Flexion Towel Slide at Table Top Full Range of Motion - 2 x daily - 7

## 2022-02-11 ENCOUNTER — Ambulatory Visit: Payer: Self-pay

## 2022-02-11 ENCOUNTER — Other Ambulatory Visit: Payer: Self-pay

## 2022-02-12 NOTE — Telephone Encounter (Signed)
Pt came by to get her $25.00 back, per Nancy' \\Christy'$  but there is no record that someone spoke with her to give money back, and I cant locate anyone in the office to address the issue. ? ? ?Meagan Flores took lead of the issue as the patient was here in the office  ? ? ?

## 2022-02-12 NOTE — Telephone Encounter (Signed)
Pt left the office, as we could not locate any notes giving back money  ? ? ? ?Per [employer] there is a HIM form scanned -- ? ? ?

## 2022-02-15 NOTE — Telephone Encounter (Signed)
IC, spoke with patient. I went over last documentation with [employer] is that patient was to speak with a rep with [employer]. patient states she does NOT need a form completed. I advised her I will leave her $$envelope (sealed) that she paid to [employer] at the front desk.I placed in brown envelope. Note on envelope to check out in the log book. She stated she will come in tomorrow to pickup. She wants the auth for [employer] to be scanned in her chart to be on file. ?

## 2022-02-16 ENCOUNTER — Ambulatory Visit: Payer: Self-pay | Admitting: Physical Therapy

## 2022-02-16 ENCOUNTER — Other Ambulatory Visit: Payer: Self-pay

## 2022-02-16 ENCOUNTER — Encounter: Payer: Self-pay | Admitting: Physical Therapy

## 2022-02-16 DIAGNOSIS — M6281 Muscle weakness (generalized): Secondary | ICD-10-CM

## 2022-02-16 DIAGNOSIS — R600 Localized edema: Secondary | ICD-10-CM

## 2022-02-16 DIAGNOSIS — M25611 Stiffness of right shoulder, not elsewhere classified: Secondary | ICD-10-CM

## 2022-02-16 DIAGNOSIS — G8929 Other chronic pain: Secondary | ICD-10-CM

## 2022-02-16 NOTE — Therapy (Signed)
?OUTPATIENT PHYSICAL THERAPY TREATMENT NOTE ? ? ?Patient Name: Meagan Flores ?MRN: [id number] ?DOB:01-10-1966, 56 y.o., female ?Today's Date: 02/16/2022 ? ?PCP: Elsie Stain, MD ?REFERRING PROVIDER: Aundra Dubin, PA-C ? ? PT End of Session - 02/16/22 0914   ? ? Visit Number 10   ? Number of Visits 16   ? Date for PT Re-Evaluation 03/03/22   ? Authorization Type self pay   ? PT Start Time [phone]   ? PT Stop Time 0958 +10 min vaso  ? PT Time Calculation (min) 44 min   ? Activity Tolerance Patient tolerated treatment well;Patient limited by pain   ? Behavior During Therapy [facility] for tasks assessed/performed   ? ?  ?  ? ?  ? ? ? ? ? ?Past Medical History:  ?Diagnosis Date  ? Acute pain of left shoulder 09/08/2017  ? Aneurysm ([place]) 03/18/2020  ? 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 COVID-19 virus infection 02/14/2020  ? 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) tear of right shoulder 09/30/2020  ? Essential hypertension   ? Glenohumeral arthritis 12/27/2014  ? History of stroke   ? History of TIA (transient ischemic attack) 02/14/2020  ? Hyperlipidemia   ? Hypothyroidism   ? Impingement syndrome of right shoulder 12/24/2016  ? Mixed hyperlipidemia   ? 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 Prediabetes   ? Preop cardiovascular exam 11/25/2020  ? Pulmonary hypertension, unspecified ([place]) 11/25/2020  ? 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  ? Rotator cuff impingement syndrome of right shoulder 09/30/2020  ? Skin rash 02/17/2021  ? Stroke [facility])   ? Tendinopathy of right biceps tendon 09/30/2020  ? Thyroid disease   ? TIA (transient ischemic attack) 04/15/2018  ? Tobacco use 09/28/2016  ? ?Past Surgical History:  ?Procedure Laterality Date  ? BICEPT TENODESIS Right 12/16/2021  ? Procedure: RIGHT BICEPS TENODESIS;  Surgeon: Leandrew Koyanagi, MD;  Location: [place];  Service: Orthopedics;  Laterality: Right;  ? PARTIAL HYSTERECTOMY    ? right arm sx    ? SHOULDER ARTHROSCOPY WITH  BICEPSTENOTOMY Right 01/21/2021  ? Procedure: SHOULDER ARTHROSCOPY WITH BICEPSTENOTOMY;  Surgeon: Leandrew Koyanagi, MD;  Location: [place];  Service: Orthopedics;  Laterality: Right;  ? SHOULDER ARTHROSCOPY WITH DISTAL CLAVICLE RESECTION Right 01/12/2017  ? Procedure: RIGHT SHOULDER ARTHROSCOPY WITH  SUBACROMIAL DECOMPRESSION, DISTAL CLAVICLE EXCISION, extensive debridement;  Surgeon: Leandrew Koyanagi, MD;  Location: [place];  Service: Orthopedics;  Laterality: Right;  ? THYMECTOMY  2004  ? THYROIDECTOMY    ? ?Patient Active Problem List  ? Diagnosis Date Noted  ? Biceps tendonosis of right shoulder 02/10/2022  ? S/P arthroscopy of right shoulder 02/10/2022  ? Skin rash 02/17/2021  ? Preop cardiovascular exam 11/25/2020  ? Pulmonary hypertension, unspecified ([place]) 11/25/2020  ? Prediabetes   ? Hyperlipidemia   ? 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) tear of right shoulder 09/30/2020  ? Rotator cuff impingement syndrome of right shoulder 09/30/2020  ? 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 Tendinopathy of right biceps tendon 09/30/2020  ? 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  ? Aneurysm ([place]) 03/18/2020  ? History of TIA (transient ischemic attack) 02/14/2020  ? History of stroke   ? Mixed hyperlipidemia   ? Essential hypertension   ? Acute pain of left shoulder 09/08/2017  ? Impingement syndrome of right shoulder 12/24/2016  ? Tobacco use 09/28/2016  ? Glenohumeral arthritis 12/27/2014  ? 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 ? ?REFERRING DIAG: Referral diagnosis: Biceps  tendonosis of right shoulder [M67.813] ? ?THERAPY DIAG:  ?Chronic right shoulder pain ? ?Stiffness of right shoulder, not elsewhere classified ? ?Muscle weakness (generalized) ? ?Localized edema ? ?PERTINENT HISTORY: Hx of debridement and sub acromial decompression x2 (2018 and 2022). Hx of stroke, prediabetes ? ?PRECAUTIONS/RESTRICTIONS:  ? ?PRECAUTIONS: Shoulder biceps tenodesis  1/18 ?  ?WEIGHT BEARING RESTRICTIONS Yes see precautions ? ?SUBJECTIVE: I've been doing my exercises at home ? ?Pain:  ?Are you having pain? Yes ?Pain location: Rt shoulder ?NPRS scale: current 6/10  ?Aggravating factors: shoulder movement ?Relieving factors: rest, ice ?Pain description: intermittent, sharp, and aching ?Severity: high ?Irritability: low ?Stage: Chronic ? ?OBJECTIVE: ? ?UPPER EXTREMITY AROM: ?  ?ROM Right ?01/06/2022 Left ?01/06/2022 Right ?01/28/2022  ?Shoulder flexion   140 52p!  ?Shoulder abduction       ?Shoulder internal rotation       ?Shoulder external rotation       ?Functional IR       ?Functional ER       ?Shoulder extension       ?Elbow extension       ?Elbow flexion       ?  ?(Blank rows = not tested, N = WNL, * = concordant pain with testing) ?  ?UPPER EXTREMITY MMT: ?  ?MMT Right ?01/06/2022 Left ?01/06/2022  ?Shoulder flexion      ?Shoulder abduction (C5)      ?Shoulder ER      ?Shoulder IR      ?Middle trapezius      ?Lower trapezius      ?Shoulder extension      ?Grip strength      ?Cervical flexion (C1,C2)      ?Cervical S/B (C3)      ?Shoulder shrug (C4)      ?Elbow flexion (C6)      ?Elbow ext (C7)      ?Thumb ext (C8)      ?Finger abd (T1)      ?Grossly      ?  ?(Blank rows = not tested, score listed is out of 5 possible points.  N = WNL, D = diminished, C = clear for gross weakness with myotome testing, * = concordant pain with testing) ?  ?           ?  ?UPPER EXTREMITY PROM: ?  ?PROM Right ?01/06/2022 Left ?01/06/2022 Right ?01/28/2022 Right ?02/09/2022 Right ?02/11/2022  ?Shoulder flexion 95*   95p! At end range 85p! 97p!  ?Shoulder abduction         ?Shoulder internal rotation         ?Shoulder external rotation >40 degrees*       ?Functional IR         ?Functional ER         ?Shoulder extension         ?Elbow extension Full Full     ?Elbow flexion         ?  ?(Blank rows = not tested, N = WNL, * = concordant pain with testing) ? ? ?ASTERISK SIGNS ? ? ?Asterisk Signs 2/17 2/17 2/28 3/10 3/14  3/16 3/21  ?Shoulder flexion PROM 100 100 (held d/t pain) 110 w/ pain  85 97   ?Shoulder flexion AROM     62 60   ?Shoulder AAROM    90   95  ?         ?         ? ? ?TREATMENT  ?  [facility] Adult PT Treatment:                                                DATE: 02/16/2022 ?Therapeutic Exercise: ?UBE level 2 x5 min fwd for warm up while taking subjective ?cross body stretch - 30'' x2 (NT) ?biceps curl - 2# - 2x35 ?UE ranger - flexion and abd - 30x ea ?Standing row YTB - 3x10 ?Shoulder ext YTB - 3x10 ?Standing ER with YTB - 3x10 ?Standing IR with RTB - 3x10 ?Towel slide - 20x flexion ? ?Modalities: ?Vasopneumatic (Game Ready)   ?Location:  right shoulder ?Time:  10 minutes ?Pressure:  low ?Temperature:  44 degrees ? ?TREATMENT  ?[facility] Adult PT Treatment:                                                DATE: 02/11/2022 ?Therapeutic Exercise: ?UBE level 2 x5 min fwd for warm up while taking subjective ?cross body stretch - 30'' x2 ?biceps curl - 1# - 2x35 ?L S/L ER - 3x10 - 1# ?AAROM flexion with P ball - 2x10 - limited to about 50-60 degrees ?Forearm pronation/supination 1# 2x25 ?Shoulder flexion Pball rollout 5" hold x10 ?Shoulder scaption Pball rollout 5" hold x10 ?Manual therapy: x8 mins ?PROM flexion and gentle ER ?Grade II AP mobs ?Modalities: ?Vasopneumatic (Game Ready)   ?Location:  right shoulder ?Time:  10 minutes ?Pressure:  low ?Temperature:  44 degrees ? ? ?[facility] Adult PT Treatment:                                                DATE: 02/09/2022 ?Therapeutic Exercise: ?UBE 5' x5 min fwd for warm up while taking subjective ?cross body stretch - 30'' x2 ?biceps curl - 1# - 2x30 ?L S/L ER - 3x10 - 1# ?AAROM flexion with P ball - 2x10 - limited to about 50-60 degrees ?Forearm pronation/supination 1# 2x20 ?Manual therapy: x10 mins ?PROM flexion and gentle ER ?Modalities: ?Vasopneumatic (Game Ready)   ?Location:  right shoulder ?Time:  10 minutes ?Pressure:  low ?Temperature:  44 degrees ? ? ?02/05/2022: ?Therapeutic  Exercise: ?- UBE 5' fwd for warm up while taking subjective ?- cross body stretch - 45'' x2 ?- biceps curl - 1# - 2x50 ?- UE ranger for flexion/scaption - 30 in - 20x ?- L S/L ER - 4x10 - 1# ?- AAROM flexion  with P ball -

## 2022-02-18 ENCOUNTER — Other Ambulatory Visit: Payer: Self-pay

## 2022-02-18 ENCOUNTER — Encounter: Payer: Self-pay | Admitting: Physical Therapy

## 2022-02-18 ENCOUNTER — Ambulatory Visit: Payer: Self-pay | Admitting: Physical Therapy

## 2022-02-18 DIAGNOSIS — M25611 Stiffness of right shoulder, not elsewhere classified: Secondary | ICD-10-CM

## 2022-02-18 DIAGNOSIS — R600 Localized edema: Secondary | ICD-10-CM

## 2022-02-18 DIAGNOSIS — G8929 Other chronic pain: Secondary | ICD-10-CM

## 2022-02-18 DIAGNOSIS — M6281 Muscle weakness (generalized): Secondary | ICD-10-CM

## 2022-02-18 NOTE — Therapy (Signed)
?OUTPATIENT PHYSICAL THERAPY TREATMENT NOTE ? ? ?Patient Name: Meagan Flores ?MRN: [id number] ?DOB:[birth year], 56 y.o., female ?Today's Date: 02/18/2022 ? ?PCP: Elsie Stain, MD ?REFERRING PROVIDER: Aundra Dubin, PA-C ? ? PT End of Session - 02/16/22 0914   ? ? Visit Number 10   ? Number of Visits 16   ? Date for PT Re-Evaluation 03/03/22   ? Authorization Type self pay   ? PT Start Time [phone]   ? PT Stop Time 0958 +10 min vaso  ? PT Time Calculation (min) 44 min   ? Activity Tolerance Patient tolerated treatment well;Patient limited by pain   ? Behavior During Therapy [facility] for tasks assessed/performed   ? ?  ?  ? ?  ? ? ? ? ? ?Past Medical History:  ?Diagnosis Date  ? Acute pain of left shoulder 09/08/2017  ? Aneurysm ([place]) 03/18/2020  ? 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 COVID-19 virus infection 02/14/2020  ? 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) tear of right shoulder 09/30/2020  ? Essential hypertension   ? Glenohumeral arthritis 12/27/2014  ? History of stroke   ? History of TIA (transient ischemic attack) 02/14/2020  ? Hyperlipidemia   ? Hypothyroidism   ? Impingement syndrome of right shoulder 12/24/2016  ? Mixed hyperlipidemia   ? 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 Prediabetes   ? Preop cardiovascular exam 11/25/2020  ? Pulmonary hypertension, unspecified ([place]) 11/25/2020  ? 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  ? Rotator cuff impingement syndrome of right shoulder 09/30/2020  ? Skin rash 02/17/2021  ? Stroke [facility])   ? Tendinopathy of right biceps tendon 09/30/2020  ? Thyroid disease   ? TIA (transient ischemic attack) 04/15/2018  ? Tobacco use 09/28/2016  ? ?Past Surgical History:  ?Procedure Laterality Date  ? BICEPT TENODESIS Right 12/16/2021  ? Procedure: RIGHT BICEPS TENODESIS;  Surgeon: Leandrew Koyanagi, MD;  Location: [place];  Service: Orthopedics;  Laterality: Right;  ? PARTIAL HYSTERECTOMY    ? right arm sx    ? SHOULDER ARTHROSCOPY WITH  BICEPSTENOTOMY Right 01/21/2021  ? Procedure: SHOULDER ARTHROSCOPY WITH BICEPSTENOTOMY;  Surgeon: Leandrew Koyanagi, MD;  Location: [place];  Service: Orthopedics;  Laterality: Right;  ? SHOULDER ARTHROSCOPY WITH DISTAL CLAVICLE RESECTION Right 01/12/2017  ? Procedure: RIGHT SHOULDER ARTHROSCOPY WITH  SUBACROMIAL DECOMPRESSION, DISTAL CLAVICLE EXCISION, extensive debridement;  Surgeon: Leandrew Koyanagi, MD;  Location: [place];  Service: Orthopedics;  Laterality: Right;  ? THYMECTOMY  2004  ? THYROIDECTOMY    ? ?Patient Active Problem List  ? Diagnosis Date Noted  ? Biceps tendonosis of right shoulder 02/10/2022  ? S/P arthroscopy of right shoulder 02/10/2022  ? Skin rash 02/17/2021  ? Preop cardiovascular exam 11/25/2020  ? Pulmonary hypertension, unspecified ([place]) 11/25/2020  ? Prediabetes   ? Hyperlipidemia   ? 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) tear of right shoulder 09/30/2020  ? Rotator cuff impingement syndrome of right shoulder 09/30/2020  ? 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 Tendinopathy of right biceps tendon 09/30/2020  ? 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  ? Aneurysm ([place]) 03/18/2020  ? History of TIA (transient ischemic attack) 02/14/2020  ? History of stroke   ? Mixed hyperlipidemia   ? Essential hypertension   ? Acute pain of left shoulder 09/08/2017  ? Impingement syndrome of right shoulder 12/24/2016  ? Tobacco use 09/28/2016  ? Glenohumeral arthritis 12/27/2014  ? 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 ? ?REFERRING DIAG: Referral diagnosis: Biceps  tendonosis of right shoulder [M67.813] ? ?THERAPY DIAG:  ?Chronic right shoulder pain ? ?Stiffness of right shoulder, not elsewhere classified ? ?Muscle weakness (generalized) ? ?Localized edema ? ?PERTINENT HISTORY: Hx of debridement and sub acromial decompression x2 (2018 and 2022). Hx of stroke, prediabetes ? ?PRECAUTIONS/RESTRICTIONS:  ? ?PRECAUTIONS: Shoulder biceps tenodesis  1/18 ?  ?WEIGHT BEARING RESTRICTIONS Yes see precautions ? ?SUBJECTIVE:  ? ?Pt reports that her shoulder has been hurting more lately.  She was sore after last session. ? ?Pain:  ?Are you having pain? Yes ?Pain location: Rt shoulder ?NPRS scale: current 7/10  ?Aggravating factors: shoulder movement ?Relieving factors: rest, ice ?Pain description: intermittent, sharp, and aching ?Severity: high ?Irritability: low ?Stage: Chronic ? ?OBJECTIVE: ? ?UPPER EXTREMITY AROM: ?  ?ROM Right ?01/06/2022 Left ?01/06/2022 Right ?01/28/2022  ?Shoulder flexion   140 52p!  ?Shoulder abduction       ?Shoulder internal rotation       ?Shoulder external rotation       ?Functional IR       ?Functional ER       ?Shoulder extension       ?Elbow extension       ?Elbow flexion       ?  ?(Blank rows = not tested, N = WNL, * = concordant pain with testing) ?  ?UPPER EXTREMITY MMT: ?  ?MMT Right ?01/06/2022 Left ?01/06/2022  ?Shoulder flexion      ?Shoulder abduction (C5)      ?Shoulder ER      ?Shoulder IR      ?Middle trapezius      ?Lower trapezius      ?Shoulder extension      ?Grip strength      ?Cervical flexion (C1,C2)      ?Cervical S/B (C3)      ?Shoulder shrug (C4)      ?Elbow flexion (C6)      ?Elbow ext (C7)      ?Thumb ext (C8)      ?Finger abd (T1)      ?Grossly      ?  ?(Blank rows = not tested, score listed is out of 5 possible points.  N = WNL, D = diminished, C = clear for gross weakness with myotome testing, * = concordant pain with testing) ?  ?           ?  ?UPPER EXTREMITY PROM: ?  ?PROM Right ?01/06/2022 Left ?01/06/2022 Right ?01/28/2022 Right ?02/09/2022 Right ?02/11/2022  ?Shoulder flexion 95*   95p! At end range 85p! 97p!  ?Shoulder abduction         ?Shoulder internal rotation         ?Shoulder external rotation >40 degrees*       ?Functional IR         ?Functional ER         ?Shoulder extension         ?Elbow extension Full Full     ?Elbow flexion         ?  ?(Blank rows = not tested, N = WNL, * = concordant pain with  testing) ? ? ?ASTERISK SIGNS ? ? ?Asterisk Signs 2/17 2/17 2/28 3/10 3/14 3/16 3/21  ?Shoulder flexion PROM 100 100 (held d/t pain) 110 w/ pain  85 97   ?Shoulder flexion AROM     62 60   ?Shoulder AAROM    90   95  ?         ?         ? ? ?  TREATMENT  ? ?[facility] Adult PT Treatment:                                                DATE: 02/18/2022 ?Therapeutic Exercise: ?UBE level 2 x5 min fwd for warm up while taking subjective ?cross body stretch - 30'' x2 (NT) ?biceps curl - 1# - 2x50 ?UE ranger - flexion and scaption - 30x ea - 28'' ?Standing row YTB - 3x10 ?Shoulder ext YTB - 3x10 ?Standing ER with YTB - 3x10 (not today d/t pain ?Standing IR with RTB - 3x10 ?Towel slide - 20x flexion ?P[place]ley 2' flexion and scaption ? ?Modalities: ?Vasopneumatic (Game Ready)   ?Location:  right shoulder ?Time:  10 minutes ?Pressure:  low ?Temperature:  44 degrees ? ?[facility] Adult PT Treatment:                                                DATE: 02/16/2022 ?Therapeutic Exercise: ?UBE level 2 x5 min fwd for warm up while taking subjective ?cross body stretch - 30'' x2 (NT) ?biceps curl - 2# - 2x35 ?UE ranger - flexion and abd - 30x ea ?Standing row YTB - 3x10 ?Shoulder ext YTB - 3x10 ?Standing ER with YTB - 3x10 ?Standing IR with RTB - 3x10 ?Towel slide - 20x flexion ? ?Modalities: ?Vasopneumatic (Game Ready)   ?Location:  right shoulder ?Time:  10 minutes ?Pressure:  low ?Temperature:  44 degrees ? ?TREATMENT  ?[facility] Adult PT Treatment:                                                DATE: 02/11/2022 ?Therapeutic Exercise: ?UBE level 2 x5 min fwd for warm up while taking subjective ?cross body stretch - 30'' x2 ?biceps curl - 1# - 2x35 ?L S/L ER - 3x10 - 1# ?AAROM flexion with P ball - 2x10 - limited to about 50-60 degrees ?Forearm pronation/supination 1# 2x25 ?Shoulder flexion Pball rollout 5" hold x10 ?Shoulder scaption Pball rollout 5" hold x10 ?Manual therapy: x8 mins ?PROM flexion and gentle ER ?Grade II AP  mobs ?Modalities: ?Vasopneumatic (Game Ready)   ?Location:  right shoulder ?Time:  10 minutes ?Pressure:  low ?Temperature:  44 degrees ? ? ?[facility] Adult PT Treatment:                                                DATE: 02/09/2022 ?Therapeutic Exercise: ?UBE 5'

## 2022-02-23 ENCOUNTER — Ambulatory Visit: Payer: Self-pay

## 2022-02-23 ENCOUNTER — Other Ambulatory Visit: Payer: Self-pay

## 2022-02-23 DIAGNOSIS — M25611 Stiffness of right shoulder, not elsewhere classified: Secondary | ICD-10-CM

## 2022-02-23 DIAGNOSIS — G8929 Other chronic pain: Secondary | ICD-10-CM

## 2022-02-23 NOTE — Therapy (Signed)
?OUTPATIENT PHYSICAL THERAPY TREATMENT NOTE ? ? ?Patient Name: Meagan Flores ?MRN: [id number] ?DOB:05/17/1966, 56 y.o., female ?Today's Date: 02/23/2022 ? ?PCP: Elsie Stain, MD ?REFERRING PROVIDER: Aundra Dubin, PA-C ? ? PT End of Session - 02/23/22 0915   ? ? Visit Number 12   ? Number of Visits 16   ? Date for PT Re-Evaluation 03/03/22   ? Authorization Type self pay   ? PT Start Time [id number]   ? PT Stop Time 0955   +10 min of vaso post  ? PT Time Calculation (min) 39 min   ? Activity Tolerance Patient tolerated treatment well;Patient limited by pain   ? Behavior During Therapy [facility] for tasks assessed/performed   ? ?  ?  ? ?  ?  ? ? ?Past Medical History:  ?Diagnosis Date  ? Acute pain of left shoulder 09/08/2017  ? Aneurysm ([place]) 03/18/2020  ? 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 COVID-19 virus infection 02/14/2020  ? 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) tear of right shoulder 09/30/2020  ? Essential hypertension   ? Glenohumeral arthritis 12/27/2014  ? History of stroke   ? History of TIA (transient ischemic attack) 02/14/2020  ? Hyperlipidemia   ? Hypothyroidism   ? Impingement syndrome of right shoulder 12/24/2016  ? Mixed hyperlipidemia   ? 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 Prediabetes   ? Preop cardiovascular exam 11/25/2020  ? Pulmonary hypertension, unspecified ([place]) 11/25/2020  ? 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  ? Rotator cuff impingement syndrome of right shoulder 09/30/2020  ? Skin rash 02/17/2021  ? Stroke [facility])   ? Tendinopathy of right biceps tendon 09/30/2020  ? Thyroid disease   ? TIA (transient ischemic attack) 04/15/2018  ? Tobacco use 09/28/2016  ? ?Past Surgical History:  ?Procedure Laterality Date  ? BICEPT TENODESIS Right 12/16/2021  ? Procedure: RIGHT BICEPS TENODESIS;  Surgeon: Leandrew Koyanagi, MD;  Location: [place];  Service: Orthopedics;  Laterality: Right;  ? PARTIAL HYSTERECTOMY    ? right arm sx    ? SHOULDER ARTHROSCOPY WITH  BICEPSTENOTOMY Right 01/21/2021  ? Procedure: SHOULDER ARTHROSCOPY WITH BICEPSTENOTOMY;  Surgeon: Leandrew Koyanagi, MD;  Location: [place];  Service: Orthopedics;  Laterality: Right;  ? SHOULDER ARTHROSCOPY WITH DISTAL CLAVICLE RESECTION Right 01/12/2017  ? Procedure: RIGHT SHOULDER ARTHROSCOPY WITH  SUBACROMIAL DECOMPRESSION, DISTAL CLAVICLE EXCISION, extensive debridement;  Surgeon: Leandrew Koyanagi, MD;  Location: [place];  Service: Orthopedics;  Laterality: Right;  ? THYMECTOMY  2004  ? THYROIDECTOMY    ? ?Patient Active Problem List  ? Diagnosis Date Noted  ? Biceps tendonosis of right shoulder 02/10/2022  ? S/P arthroscopy of right shoulder 02/10/2022  ? Skin rash 02/17/2021  ? Preop cardiovascular exam 11/25/2020  ? Pulmonary hypertension, unspecified ([place]) 11/25/2020  ? Prediabetes   ? Hyperlipidemia   ? 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) tear of right shoulder 09/30/2020  ? Rotator cuff impingement syndrome of right shoulder 09/30/2020  ? 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 Tendinopathy of right biceps tendon 09/30/2020  ? 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  ? Aneurysm ([place]) 03/18/2020  ? History of TIA (transient ischemic attack) 02/14/2020  ? History of stroke   ? Mixed hyperlipidemia   ? Essential hypertension   ? Acute pain of left shoulder 09/08/2017  ? Impingement syndrome of right shoulder 12/24/2016  ? Tobacco use 09/28/2016  ? Glenohumeral arthritis 12/27/2014  ? 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 ? ?REFERRING DIAG:  Referral diagnosis: Biceps tendonosis of right shoulder [M67.813] ? ?THERAPY DIAG:  ?Chronic right shoulder pain ? ?Stiffness of right shoulder, not elsewhere classified ? ?Muscle weakness (generalized) ? ?Localized edema ? ?PERTINENT HISTORY: Hx of debridement and sub acromial decompression x2 (2018 and 2022). Hx of stroke, prediabetes ? ?PRECAUTIONS/RESTRICTIONS:  ? ?PRECAUTIONS: Shoulder biceps tenodesis  1/18 ?  ?WEIGHT BEARING RESTRICTIONS Yes see precautions ? ?SUBJECTIVE:  ?Pt presents to PT with continued reports of R shoulder pain. Has been compliant with her HEP per report with no adverse effect. Pt is ready to begin PT at this time.  ? ?Pain:  ?Are you having pain? Yes ?Pain location: Rt shoulder ?NPRS scale: current 7/10  ?Aggravating factors: shoulder movement ?Relieving factors: rest, ice ?Pain description: intermittent, sharp, and aching ?Severity: high ?Irritability: low ?Stage: Chronic ? ?OBJECTIVE: ? ?UPPER EXTREMITY AROM: ?  ?ROM Right ?01/06/2022 Left ?01/06/2022 Right ?01/28/2022  ?Shoulder flexion   140 52p!  ?Shoulder abduction       ?Shoulder internal rotation       ?Shoulder external rotation       ?Functional IR       ?Functional ER       ?Shoulder extension       ?Elbow extension       ?Elbow flexion       ?  ?(Blank rows = not tested, N = WNL, * = concordant pain with testing) ?  ?UPPER EXTREMITY MMT: ?  ?MMT Right ?01/06/2022 Left ?01/06/2022  ?Shoulder flexion      ?Shoulder abduction (C5)      ?Shoulder ER      ?Shoulder IR      ?Middle trapezius      ?Lower trapezius      ?Shoulder extension      ?Grip strength      ?Cervical flexion (C1,C2)      ?Cervical S/B (C3)      ?Shoulder shrug (C4)      ?Elbow flexion (C6)      ?Elbow ext (C7)      ?Thumb ext (C8)      ?Finger abd (T1)      ?Grossly      ?  ?(Blank rows = not tested, score listed is out of 5 possible points.  N = WNL, D = diminished, C = clear for gross weakness with myotome testing, * = concordant pain with testing) ?  ?           ?  ?UPPER EXTREMITY PROM: ?  ?PROM Right ?01/06/2022 Left ?01/06/2022 Right ?01/28/2022 Right ?02/09/2022 Right ?02/11/2022  ?Shoulder flexion 95*   95p! At end range 85p! 97p!  ?Shoulder abduction         ?Shoulder internal rotation         ?Shoulder external rotation >40 degrees*       ?Functional IR         ?Functional ER         ?Shoulder extension         ?Elbow extension Full Full     ?Elbow flexion         ?   ?(Blank rows = not tested, N = WNL, * = concordant pain with testing) ? ? ?ASTERISK SIGNS ? ? ?Asterisk Signs 2/17 2/17 2/28 3/10 3/14 3/16 3/21  ?Shoulder flexion PROM 100 100 (held d/t pain) 110 w/ pain  85 97   ?Shoulder flexion AROM     62 60   ?Shoulder AAROM  90   95  ?         ?         ? ? ?TREATMENT  ? ?[facility] Adult PT Treatment:                                                DATE: 02/23/2022 ?Therapeutic Exercise: ?UBE level 1.5 x 4 min fwd for warm up while taking subjective ?cross body stretch - 30'' x2 (NT) ?biceps curl - 1# - 2x50 ?UE ranger - flexion and scaption - 30x ea - 28'' ?Standing row YTB - 3x10 ?Shoulder ext YTB - 3x10 ?Towel slide - 20x flexion ?Pulley 2' flexion and scaption ? ?Modalities: ?Vasopneumatic (Game Ready)   ?Location:  right shoulder ?Time:  10 minutes ?Pressure:  low ?Temperature:  44 degrees ? ?[facility] Adult PT Treatment:                                                DATE: 02/16/2022 ?Therapeutic Exercise: ?UBE level 2 x5 min fwd for warm up while taking subjective ?cross body stretch - 30'' x2 (NT) ?biceps curl - 2# - 2x35 ?UE ranger - flexion and abd - 30x ea ?Standing row YTB - 3x10 ?Shoulder ext YTB - 3x10 ?Standing ER with YTB - 3x10 ?Standing IR with RTB - 3x10 ?Towel slide - 20x flexion ? ?Modalities: ?Vasopneumatic (Game Ready)   ?Location:  right shoulder ?Time:  10 minutes ?Pressure:  low ?Temperature:  44 degrees ? ?TREATMENT  ?[facility] Adult PT Treatment:                                                DATE: 02/11/2022 ?Therapeutic Exercise: ?UBE level 2 x5 min fwd for warm up while taking subjective ?cross body stretch - 30'' x2 ?biceps curl - 1# - 2x35 ?L S/L ER - 3x10 - 1# ?AAROM flexion with P ball - 2x10 - limited to about 50-60 degrees ?Forearm pronation/supination 1# 2x25 ?Shoulder flexion Pball rollout 5" hold x10 ?Shoulder scaption Pball rollout 5" hold x10 ?Manual therapy: x8 mins ?PROM flexion and gentle ER ?Grade II AP mobs ?Modalities: ?Vasopneumatic (Game  Ready)   ?Location:  right shoulder ?Time:  10 minutes ?Pressure:  low ?Temperature:  44 degrees ? ? ?HOME EXERCISE PROGRAM: ?Access Code: [email or web address] ?URL: [email or web address] ?Date: 01/15/2022 ?Prepared

## 2022-02-24 ENCOUNTER — Ambulatory Visit: Payer: Self-pay | Admitting: Clinical

## 2022-02-24 NOTE — Therapy (Addendum)
?OUTPATIENT PHYSICAL THERAPY TREATMENT NOTE ? ? ?Patient Name: Meagan Flores ?MRN: [id number] ?DOB:04-10-1966, 56 y.o., female ?Today's Date: 02/25/2022 ? ?PCP: Elsie Stain, MD ?REFERRING PROVIDER: Aundra Dubin, PA-C ? ? PT End of Session - 02/25/22 0913   ? ? Visit Number 13   ? Number of Visits 16   ? Date for PT Re-Evaluation 03/03/22   ? Authorization Type self pay   ? PT Start Time [id number]   ? PT Stop Time [phone]   ? PT Time Calculation (min) 45 min   ? Activity Tolerance Patient tolerated treatment well;Patient limited by pain   ? Behavior During Therapy [facility] for tasks assessed/performed   ? ?  ?  ? ?  ? ?  ? ? ?Past Medical History:  ?Diagnosis Date  ? Acute pain of left shoulder 09/08/2017  ? Aneurysm ([place]) 03/18/2020  ? 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 COVID-19 virus infection 02/14/2020  ? 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) tear of right shoulder 09/30/2020  ? Essential hypertension   ? Glenohumeral arthritis 12/27/2014  ? History of stroke   ? History of TIA (transient ischemic attack) 02/14/2020  ? Hyperlipidemia   ? Hypothyroidism   ? Impingement syndrome of right shoulder 12/24/2016  ? Mixed hyperlipidemia   ? 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 Prediabetes   ? Preop cardiovascular exam 11/25/2020  ? Pulmonary hypertension, unspecified ([place]) 11/25/2020  ? 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  ? Rotator cuff impingement syndrome of right shoulder 09/30/2020  ? Skin rash 02/17/2021  ? Stroke [facility])   ? Tendinopathy of right biceps tendon 09/30/2020  ? Thyroid disease   ? TIA (transient ischemic attack) 04/15/2018  ? Tobacco use 09/28/2016  ? ?Past Surgical History:  ?Procedure Laterality Date  ? BICEPT TENODESIS Right 12/16/2021  ? Procedure: RIGHT BICEPS TENODESIS;  Surgeon: Leandrew Koyanagi, MD;  Location: [place];  Service: Orthopedics;  Laterality: Right;  ? PARTIAL HYSTERECTOMY    ? right arm sx    ? SHOULDER ARTHROSCOPY WITH BICEPSTENOTOMY Right  01/21/2021  ? Procedure: SHOULDER ARTHROSCOPY WITH BICEPSTENOTOMY;  Surgeon: Leandrew Koyanagi, MD;  Location: [place];  Service: Orthopedics;  Laterality: Right;  ? SHOULDER ARTHROSCOPY WITH DISTAL CLAVICLE RESECTION Right 01/12/2017  ? Procedure: RIGHT SHOULDER ARTHROSCOPY WITH  SUBACROMIAL DECOMPRESSION, DISTAL CLAVICLE EXCISION, extensive debridement;  Surgeon: Leandrew Koyanagi, MD;  Location: [place];  Service: Orthopedics;  Laterality: Right;  ? THYMECTOMY  2004  ? THYROIDECTOMY    ? ?Patient Active Problem List  ? Diagnosis Date Noted  ? Biceps tendonosis of right shoulder 02/10/2022  ? S/P arthroscopy of right shoulder 02/10/2022  ? Skin rash 02/17/2021  ? Preop cardiovascular exam 11/25/2020  ? Pulmonary hypertension, unspecified ([place]) 11/25/2020  ? Prediabetes   ? Hyperlipidemia   ? 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) tear of right shoulder 09/30/2020  ? Rotator cuff impingement syndrome of right shoulder 09/30/2020  ? 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 Tendinopathy of right biceps tendon 09/30/2020  ? 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  ? Aneurysm ([place]) 03/18/2020  ? History of TIA (transient ischemic attack) 02/14/2020  ? History of stroke   ? Mixed hyperlipidemia   ? Essential hypertension   ? Acute pain of left shoulder 09/08/2017  ? Impingement syndrome of right shoulder 12/24/2016  ? Tobacco use 09/28/2016  ? Glenohumeral arthritis 12/27/2014  ? 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 ? ?REFERRING DIAG: Referral diagnosis: Biceps tendonosis of  right shoulder [M67.813] ? ?THERAPY DIAG:  ?Chronic right shoulder pain ? ?Stiffness of right shoulder, not elsewhere classified ? ?Muscle weakness (generalized) ? ?Localized edema ? ?PERTINENT HISTORY: Hx of debridement and sub acromial decompression x2 (2018 and 2022). Hx of stroke, prediabetes ? ?PRECAUTIONS/RESTRICTIONS:  ? ?PRECAUTIONS: Shoulder biceps tenodesis 1/18 ?  ?WEIGHT BEARING  RESTRICTIONS Yes see precautions ? ?SUBJECTIVE: The pain just ain't going nowhere in my arm. I'm doing the exercises at home. ? ?Pain:  ?Are you having pain? Yes ?Pain location: Rt shoulder ?NPRS scale: current 6/10  ?Aggravating factors: shoulder movement ?Relieving factors: rest, ice ?Pain description: intermittent, sharp, and aching ?Severity: high ?Irritability: low ?Stage: Chronic ? ?OBJECTIVE: ? ?UPPER EXTREMITY AROM: ?  ?ROM Right ?01/06/2022 Left ?01/06/2022 Right ?01/28/2022 Right ?02/25/2022  ?Shoulder flexion   140 52p! 80p!  ?Shoulder abduction        ?Shoulder internal rotation        ?Shoulder external rotation        ?Functional IR        ?Functional ER        ?Shoulder extension        ?Elbow extension        ?Elbow flexion        ?  ?(Blank rows = not tested, N = WNL, * = concordant pain with testing) ?  ?UPPER EXTREMITY MMT: ?  ?MMT Right ?01/06/2022 Left ?01/06/2022  ?Shoulder flexion      ?Shoulder abduction (C5)      ?Shoulder ER      ?Shoulder IR      ?Middle trapezius      ?Lower trapezius      ?Shoulder extension      ?Grip strength      ?Cervical flexion (C1,C2)      ?Cervical S/B (C3)      ?Shoulder shrug (C4)      ?Elbow flexion (C6)      ?Elbow ext (C7)      ?Thumb ext (C8)      ?Finger abd (T1)      ?Grossly      ?  ?(Blank rows = not tested, score listed is out of 5 possible points.  N = WNL, D = diminished, C = clear for gross weakness with myotome testing, * = concordant pain with testing) ?  ?           ?  ?UPPER EXTREMITY PROM: ?  ?PROM Right ?01/06/2022 Left ?01/06/2022 Right ?01/28/2022 Right ?02/09/2022 Right ?02/11/2022  ?Shoulder flexion 95*   95p! At end range 85p! 97p!  ?Shoulder abduction         ?Shoulder internal rotation         ?Shoulder external rotation >40 degrees*       ?Functional IR         ?Functional ER         ?Shoulder extension         ?Elbow extension Full Full     ?Elbow flexion         ?  ?(Blank rows = not tested, N = WNL, * = concordant pain with testing) ? ? ?ASTERISK  SIGNS ? ? ?Asterisk Signs 2/17 2/17 2/28 3/10 3/14 3/16 3/21 3/30  ?Shoulder flexion PROM 100 100 (held d/t pain) 110 w/ pain  85 97    ?Shoulder flexion AROM     62 60  80p!  ?Shoulder AAROM    90   95 98p!  ?          ?          ? ? ?  TREATMENT  ?[facility] Adult PT Treatment:                                                DATE: 02/25/2022 ?Therapeutic Exercise: ?UBE level 2 x 5 min fwd for warm up while taking subjective ?cross body stretch - 30'' x2  ?biceps curl - 1# - 2x50 ?UE ranger - flexion and scaption - 30x ea - 28'' ?Standing row YTB - 3x10 ?Shoulder ext YTB - 3x10 ?Standing IR/ER YTB 2x10 Rt ?Towel slide on wall - 20x flexion ?Pulley 2' flexion and scaption ?Modalities: ?Vasopneumatic (Game Ready)   ?Location:  right shoulder ?Time:  10 minutes ?Pressure:  medium ?Temperature:  38 degrees ? ?[facility] Adult PT Treatment:                                                DATE: 02/23/2022 ?Therapeutic Exercise: ?UBE level 1.5 x 4 min fwd for warm up while taking subjective ?cross body stretch - 30'' x2 (NT) ?biceps curl - 1# - 2x50 ?UE ranger - flexion and scaption - 30x ea - 28'' ?Standing row YTB - 3x10 ?Shoulder ext YTB - 3x10 ?Towel slide - 20x flexion ?Pulley 2' flexion and scaption ? ?Modalities: ?Vasopneumatic (Game Ready)   ?Location:  right shoulder ?Time:  10 minutes ?Pressure:  low ?Temperature:  44 degrees ? ?[facility] Adult PT Treatment:                                                DATE: 02/16/2022 ?Therapeutic Exercise: ?UBE level 2 x5 min fwd for warm up while taking subjective ?cross body stretch - 30'' x2 (NT) ?biceps curl - 2# - 2x35 ?UE ranger - flexion and abd - 30x ea ?Standing row YTB - 3x10 ?Shoulder ext YTB - 3x10 ?Standing ER with YTB - 3x10 ?Standing IR with RTB - 3x10 ?Towel slide - 20x flexion ? ?Modalities: ?Vasopneumatic (Game Ready)   ?Location:  right shoulder ?Time:  10 minutes ?Pressure:  low ?Temperature:  44 degrees ? ? ? ?HOME EXERCISE PROGRAM: ?Access Code: [email or web address] ?URL:  [email or web address] ?Date: 01/15/2022 ?Prepared by: Shearon Balo ? ?Exercises ?Forearm Strengthening with Ball Squeeze - 3 x daily - 7 x weekly - 3 sets - 10 reps ?Seated Shoulder Flexion Towel Slide at [place]

## 2022-02-25 ENCOUNTER — Ambulatory Visit: Payer: Self-pay

## 2022-02-25 DIAGNOSIS — M25611 Stiffness of right shoulder, not elsewhere classified: Secondary | ICD-10-CM

## 2022-02-25 DIAGNOSIS — G8929 Other chronic pain: Secondary | ICD-10-CM

## 2022-02-25 DIAGNOSIS — M6281 Muscle weakness (generalized): Secondary | ICD-10-CM

## 2022-02-25 DIAGNOSIS — M25511 Pain in right shoulder: Secondary | ICD-10-CM

## 2022-02-25 DIAGNOSIS — R600 Localized edema: Secondary | ICD-10-CM

## 2022-03-01 ENCOUNTER — Ambulatory Visit: Payer: Self-pay | Attending: Physician Assistant

## 2022-03-01 DIAGNOSIS — M25611 Stiffness of right shoulder, not elsewhere classified: Secondary | ICD-10-CM | POA: Insufficient documentation

## 2022-03-01 DIAGNOSIS — M6281 Muscle weakness (generalized): Secondary | ICD-10-CM | POA: Insufficient documentation

## 2022-03-01 DIAGNOSIS — M25511 Pain in right shoulder: Secondary | ICD-10-CM | POA: Insufficient documentation

## 2022-03-01 DIAGNOSIS — R600 Localized edema: Secondary | ICD-10-CM | POA: Insufficient documentation

## 2022-03-01 DIAGNOSIS — G8929 Other chronic pain: Secondary | ICD-10-CM | POA: Insufficient documentation

## 2022-03-01 NOTE — Therapy (Signed)
?OUTPATIENT PHYSICAL THERAPY TREATMENT NOTE ? ? ?Patient Name: Meagan Flores ?MRN: [id number] ?DOB:[birth year], 56 y.o., female ?Today's Date: 03/01/2022 ? ?PCP: Elsie Stain, MD ?REFERRING PROVIDER: Aundra Dubin, PA-C ? ? PT End of Session - 03/01/22 0746   ? ? Visit Number 14   ? Number of Visits 16   ? Date for PT Re-Evaluation 03/03/22   ? Authorization Type self pay   ? PT Start Time [phone]   ? PT Stop Time 0820   also 10 min vaso  ? PT Time Calculation (min) 34 min   ? Activity Tolerance Patient tolerated treatment well;Patient limited by pain   ? Behavior During Therapy [facility] for tasks assessed/performed   ? ?  ?  ? ?  ? ? ?  ? ? ?Past Medical History:  ?Diagnosis Date  ? Acute pain of left shoulder 09/08/2017  ? Aneurysm ([place]) 03/18/2020  ? 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 COVID-19 virus infection 02/14/2020  ? 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) tear of right shoulder 09/30/2020  ? Essential hypertension   ? Glenohumeral arthritis 12/27/2014  ? History of stroke   ? History of TIA (transient ischemic attack) 02/14/2020  ? Hyperlipidemia   ? Hypothyroidism   ? Impingement syndrome of right shoulder 12/24/2016  ? Mixed hyperlipidemia   ? 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 Prediabetes   ? Preop cardiovascular exam 11/25/2020  ? Pulmonary hypertension, unspecified ([place]) 11/25/2020  ? 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  ? Rotator cuff impingement syndrome of right shoulder 09/30/2020  ? Skin rash 02/17/2021  ? Stroke [facility])   ? Tendinopathy of right biceps tendon 09/30/2020  ? Thyroid disease   ? TIA (transient ischemic attack) 04/15/2018  ? Tobacco use 09/28/2016  ? ?Past Surgical History:  ?Procedure Laterality Date  ? BICEPT TENODESIS Right 12/16/2021  ? Procedure: RIGHT BICEPS TENODESIS;  Surgeon: Leandrew Koyanagi, MD;  Location: [place];  Service: Orthopedics;  Laterality: Right;  ? PARTIAL HYSTERECTOMY    ? right arm sx    ? SHOULDER ARTHROSCOPY WITH  BICEPSTENOTOMY Right 01/21/2021  ? Procedure: SHOULDER ARTHROSCOPY WITH BICEPSTENOTOMY;  Surgeon: Leandrew Koyanagi, MD;  Location: [place];  Service: Orthopedics;  Laterality: Right;  ? SHOULDER ARTHROSCOPY WITH DISTAL CLAVICLE RESECTION Right 01/12/2017  ? Procedure: RIGHT SHOULDER ARTHROSCOPY WITH  SUBACROMIAL DECOMPRESSION, DISTAL CLAVICLE EXCISION, extensive debridement;  Surgeon: Leandrew Koyanagi, MD;  Location: [place];  Service: Orthopedics;  Laterality: Right;  ? THYMECTOMY  2004  ? THYROIDECTOMY    ? ?Patient Active Problem List  ? Diagnosis Date Noted  ? Biceps tendonosis of right shoulder 02/10/2022  ? S/P arthroscopy of right shoulder 02/10/2022  ? Skin rash 02/17/2021  ? Preop cardiovascular exam 11/25/2020  ? Pulmonary hypertension, unspecified ([place]) 11/25/2020  ? Prediabetes   ? Hyperlipidemia   ? 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) tear of right shoulder 09/30/2020  ? Rotator cuff impingement syndrome of right shoulder 09/30/2020  ? 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 Tendinopathy of right biceps tendon 09/30/2020  ? 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  ? Aneurysm ([place]) 03/18/2020  ? History of TIA (transient ischemic attack) 02/14/2020  ? History of stroke   ? Mixed hyperlipidemia   ? Essential hypertension   ? Acute pain of left shoulder 09/08/2017  ? Impingement syndrome of right shoulder 12/24/2016  ? Tobacco use 09/28/2016  ? Glenohumeral arthritis 12/27/2014  ? 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 ? ?REFERRING  DIAG: Referral diagnosis: Biceps tendonosis of right shoulder [M67.813] ? ?THERAPY DIAG:  ?Chronic right shoulder pain ? ?Stiffness of right shoulder, not elsewhere classified ? ?Muscle weakness (generalized) ? ?Localized edema ? ?PERTINENT HISTORY: Hx of debridement and sub acromial decompression x2 (2018 and 2022). Hx of stroke, prediabetes ? ?PRECAUTIONS/RESTRICTIONS:  ? ?PRECAUTIONS: Shoulder biceps tenodesis  1/18 ?  ?WEIGHT BEARING RESTRICTIONS Yes see precautions ? ?SUBJECTIVE:  ?Pt presents to PT with continued reports of R shoulder pain. Notes that she starts the day with a lot of pain that usually alleviates slowly throughout the day. Pt is ready to begin PT at this time. ? ?Pain:  ?Are you having pain? Yes ?Pain location: Rt shoulder ?NPRS scale: current 6/10  ?Aggravating factors: shoulder movement ?Relieving factors: rest, ice ?Pain description: intermittent, sharp, and aching ?Severity: high ?Irritability: low ?Stage: Chronic ? ?OBJECTIVE: ? ?UPPER EXTREMITY AROM: ?  ?ROM Right ?01/06/2022 Left ?01/06/2022 Right ?01/28/2022 Right ?02/25/2022  ?Shoulder flexion   140 52p! 80p!  ?Shoulder abduction        ?Shoulder internal rotation        ?Shoulder external rotation        ?Functional IR        ?Functional ER        ?Shoulder extension        ?Elbow extension        ?Elbow flexion        ?  ?(Blank rows = not tested, N = WNL, * = concordant pain with testing) ?  ?UPPER EXTREMITY MMT: ?  ?MMT Right ?01/06/2022 Left ?01/06/2022  ?Shoulder flexion      ?Shoulder abduction (C5)      ?Shoulder ER      ?Shoulder IR      ?Middle trapezius      ?Lower trapezius      ?Shoulder extension      ?Grip strength      ?Cervical flexion (C1,C2)      ?Cervical S/B (C3)      ?Shoulder shrug (C4)      ?Elbow flexion (C6)      ?Elbow ext (C7)      ?Thumb ext (C8)      ?Finger abd (T1)      ?Grossly      ?  ?(Blank rows = not tested, score listed is out of 5 possible points.  N = WNL, D = diminished, C = clear for gross weakness with myotome testing, * = concordant pain with testing) ?  ?           ?  ?UPPER EXTREMITY PROM: ?  ?PROM Right ?01/06/2022 Left ?01/06/2022 Right ?01/28/2022 Right ?02/09/2022 Right ?02/11/2022  ?Shoulder flexion 95*   95p! At end range 85p! 97p!  ?Shoulder abduction         ?Shoulder internal rotation         ?Shoulder external rotation >40 degrees*       ?Functional IR         ?Functional ER         ?Shoulder extension          ?Elbow extension Full Full     ?Elbow flexion         ?  ?(Blank rows = not tested, N = WNL, * = concordant pain with testing) ? ? ?ASTERISK SIGNS ? ? ?Asterisk Signs 2/17 2/17 2/28 3/10 3/14 3/16 3/21 3/30  ?Shoulder flexion PROM 100 100 (held d/t pain) 110 w/ pain  85 97    ?Shoulder flexion AROM     62 60  80p!  ?Shoulder AAROM    90   95 98p!  ?          ?          ? ? ?TREATMENT  ?[facility] Adult PT Treatment:                                                DATE: 03/01/2022 ?Therapeutic Exercise: ?UBE level 2 x 4 min fwd for warm up while taking subjective ?cross body stretch - 30'' x2  ?biceps curl - 2# - 2x10 ?UE ranger - flexion and scaption - 30x ea - 28'' ?Standing row RTB - 3x10 ?Shoulder ext YTB - 3x10 ?Standing IR/ER YTB 2x10 Rt ?Towel slide on wall - 20x flexion ?Pulley 2' flexion and scaption ? ?Modalities: ?Vasopneumatic (Game Ready)   ?Location:  right shoulder ?Time:  10 minutes ?Pressure:  medium ?Temperature:  38 degrees ? ?[facility] Adult PT Treatment:                                                DATE: 02/25/2022 ?Therapeutic Exercise: ?UBE level 2 x 5 min fwd for warm up while taking subjective ?cross body stretch - 30'' x2  ?biceps curl - 1# - 2x50 ?UE ranger - flexion and scaption - 30x ea - 28'' ?Standing row YTB - 3x10 ?Shoulder ext YTB - 3x10 ?Standing IR/ER YTB 2x10 Rt ?Towel slide on wall - 20x flexion ?Pulley 2' flexion and scaption ? ?Modalities: ?Vasopneumatic (Game Ready)   ?Location:  right shoulder ?Time:  10 minutes ?Pressure:  medium ?Temperature:  38 degrees ? ?[facility] Adult PT Treatment:                                                DATE: 02/23/2022 ?Therapeutic Exercise: ?UBE level 1.5 x 4 min fwd for warm up while taking subjective ?cross body stretch - 30'' x2 (NT) ?biceps curl - 1# - 2x50 ?UE ranger - flexion and scaption - 30x ea - 28'' ?Standing row YTB - 3x10 ?Shoulder ext YTB - 3x10 ?Towel slide - 20x flexion ?Pulley 2' flexion and scaption ? ?Modalities: ?Vasopneumatic (Game Ready)    ?Location:  right shoulder ?Time:  10 minutes ?Pressure:  low ?Temperature:  44 degrees ? ? ? ? ?HOME EXERCISE PROGRAM: ?Access Code: [email or web address] ?URL: [email or web address] ?Date: 01/15/2022 ?Pre

## 2022-03-03 ENCOUNTER — Encounter: Payer: Self-pay | Admitting: Physical Therapy

## 2022-03-03 ENCOUNTER — Ambulatory Visit: Payer: Self-pay | Admitting: Physical Therapy

## 2022-03-03 DIAGNOSIS — G8929 Other chronic pain: Secondary | ICD-10-CM

## 2022-03-03 DIAGNOSIS — M25611 Stiffness of right shoulder, not elsewhere classified: Secondary | ICD-10-CM

## 2022-03-03 DIAGNOSIS — M6281 Muscle weakness (generalized): Secondary | ICD-10-CM

## 2022-03-03 DIAGNOSIS — R600 Localized edema: Secondary | ICD-10-CM

## 2022-03-03 NOTE — Therapy (Signed)
?OUTPATIENT PHYSICAL THERAPY TREATMENT NOTE ? ? ?Patient Name: Meagan Flores ?MRN: [id number] ?DOB:11/28/1966, 56 y.o., female ?Today's Date: 03/03/2022 ? ?PCP: Elsie Stain, MD ?REFERRING PROVIDER: Aundra Dubin, PA-C ? ? PT End of Session - 03/03/22 1000   ? ? Visit Number 15   ? Number of Visits 16   ? Date for PT Re-Evaluation 04/28/22   ? Authorization Type self pay   ? PT Start Time 1000   ? PT Stop Time 5277   ? PT Time Calculation (min) 40 min   ? Activity Tolerance Patient tolerated treatment well;Patient limited by pain   ? Behavior During Therapy [facility] for tasks assessed/performed   ? ?  ?  ? ?  ? ? ?  ? ? ?Past Medical History:  ?Diagnosis Date  ? Acute pain of left shoulder 09/08/2017  ? Aneurysm ([place]) 03/18/2020  ? 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 COVID-19 virus infection 02/14/2020  ? 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) tear of right shoulder 09/30/2020  ? Essential hypertension   ? Glenohumeral arthritis 12/27/2014  ? History of stroke   ? History of TIA (transient ischemic attack) 02/14/2020  ? Hyperlipidemia   ? Hypothyroidism   ? Impingement syndrome of right shoulder 12/24/2016  ? Mixed hyperlipidemia   ? 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 Prediabetes   ? Preop cardiovascular exam 11/25/2020  ? Pulmonary hypertension, unspecified ([place]) 11/25/2020  ? 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  ? Rotator cuff impingement syndrome of right shoulder 09/30/2020  ? Skin rash 02/17/2021  ? Stroke [facility])   ? Tendinopathy of right biceps tendon 09/30/2020  ? Thyroid disease   ? TIA (transient ischemic attack) 04/15/2018  ? Tobacco use 09/28/2016  ? ?Past Surgical History:  ?Procedure Laterality Date  ? BICEPT TENODESIS Right 12/16/2021  ? Procedure: RIGHT BICEPS TENODESIS;  Surgeon: Leandrew Koyanagi, MD;  Location: [place];  Service: Orthopedics;  Laterality: Right;  ? PARTIAL HYSTERECTOMY    ? right arm sx    ? SHOULDER ARTHROSCOPY WITH BICEPSTENOTOMY Right  01/21/2021  ? Procedure: SHOULDER ARTHROSCOPY WITH BICEPSTENOTOMY;  Surgeon: Leandrew Koyanagi, MD;  Location: [place];  Service: Orthopedics;  Laterality: Right;  ? SHOULDER ARTHROSCOPY WITH DISTAL CLAVICLE RESECTION Right 01/12/2017  ? Procedure: RIGHT SHOULDER ARTHROSCOPY WITH  SUBACROMIAL DECOMPRESSION, DISTAL CLAVICLE EXCISION, extensive debridement;  Surgeon: Leandrew Koyanagi, MD;  Location: [place];  Service: Orthopedics;  Laterality: Right;  ? THYMECTOMY  2004  ? THYROIDECTOMY    ? ?Patient Active Problem List  ? Diagnosis Date Noted  ? Biceps tendonosis of right shoulder 02/10/2022  ? S/P arthroscopy of right shoulder 02/10/2022  ? Skin rash 02/17/2021  ? Preop cardiovascular exam 11/25/2020  ? Pulmonary hypertension, unspecified ([place]) 11/25/2020  ? Prediabetes   ? Hyperlipidemia   ? 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) tear of right shoulder 09/30/2020  ? Rotator cuff impingement syndrome of right shoulder 09/30/2020  ? 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 Tendinopathy of right biceps tendon 09/30/2020  ? 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  ? Aneurysm ([place]) 03/18/2020  ? History of TIA (transient ischemic attack) 02/14/2020  ? History of stroke   ? Mixed hyperlipidemia   ? Essential hypertension   ? Acute pain of left shoulder 09/08/2017  ? Impingement syndrome of right shoulder 12/24/2016  ? Tobacco use 09/28/2016  ? Glenohumeral arthritis 12/27/2014  ? 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 ? ?REFERRING DIAG: Referral diagnosis: Biceps tendonosis  of right shoulder [M67.813] ? ?THERAPY DIAG:  ?Chronic right shoulder pain ? ?Stiffness of right shoulder, not elsewhere classified ? ?Muscle weakness (generalized) ? ?Localized edema ? ?PERTINENT HISTORY: Hx of debridement and sub acromial decompression x2 (2018 and 2022). Hx of stroke, prediabetes ? ?PRECAUTIONS/RESTRICTIONS:  ? ?PRECAUTIONS: Shoulder biceps tenodesis 1/18 ?  ?WEIGHT BEARING  RESTRICTIONS Yes see precautions ? ?SUBJECTIVE:  ?Pt reports that she is doing "a lot better".  She feels like progress is slow, but she's getting better. ? ?Pain:  ?Are you having pain? Yes ?Pain location: Rt shoulder ?NPRS scale: current 6/10  ?Aggravating factors: shoulder movement ?Relieving factors: rest, ice ?Pain description: intermittent, sharp, and aching ?Severity: high ?Irritability: low ?Stage: Chronic ? ?OBJECTIVE: ? ?UPPER EXTREMITY AROM: ?  ?ROM Right ?01/06/2022 Left ?01/06/2022 Right ?01/28/2022 Right ?02/25/2022  ?Shoulder flexion   140 52p! 80p!  ?Shoulder abduction        ?Shoulder internal rotation        ?Shoulder external rotation        ?Functional IR        ?Functional ER        ?Shoulder extension        ?Elbow extension        ?Elbow flexion        ?  ?(Blank rows = not tested, N = WNL, * = concordant pain with testing) ?  ?UPPER EXTREMITY MMT: ?  ?MMT Right ?01/06/2022 Left ?01/06/2022  ?Shoulder flexion      ?Shoulder abduction (C5)      ?Shoulder ER      ?Shoulder IR      ?Middle trapezius      ?Lower trapezius      ?Shoulder extension      ?Grip strength      ?Cervical flexion (C1,C2)      ?Cervical S/B (C3)      ?Shoulder shrug (C4)      ?Elbow flexion (C6)      ?Elbow ext (C7)      ?Thumb ext (C8)      ?Finger abd (T1)      ?Grossly      ?  ?(Blank rows = not tested, score listed is out of 5 possible points.  N = WNL, D = diminished, C = clear for gross weakness with myotome testing, * = concordant pain with testing) ?  ?           ?  ?UPPER EXTREMITY PROM: ?  ?PROM Right ?01/06/2022 Left ?01/06/2022 Right ?01/28/2022 Right ?02/09/2022 Right ?02/11/2022  ?Shoulder flexion 95*   95p! At end range 85p! 97p!  ?Shoulder abduction         ?Shoulder internal rotation         ?Shoulder external rotation >40 degrees*       ?Functional IR         ?Functional ER         ?Shoulder extension         ?Elbow extension Full Full     ?Elbow flexion         ?  ?(Blank rows = not tested, N = WNL, * = concordant pain  with testing) ? ? ?ASTERISK SIGNS ? ? ?Asterisk Signs 2/17 2/17 2/28 3/10 3/14 3/16 3/21 3/30 4/5  ?Shoulder flexion PROM 100 100 (held d/t pain) 110 w/ pain  85 97     ?Shoulder flexion AROM     62 60  80p! 90 P!  ?Shoulder  AAROM    90   95 98p!   ?           ?           ? ? ?TREATMENT  ? ?[facility] Adult PT Treatment:                                                DATE: 03/03/2022 ?Therapeutic Exercise: ?UBE level 2 x 4 min fwd for warm up while taking subjective ?cross body stretch - 45'' x2  ?biceps curl - 3# - 3x10 ?UE ranger - flexion and scaption - 30x ea - 33'' ?Standing row RTB - 3x10 ?Shoulder ext YTB - 3x10 ?Standing IR/ER YTB 2x10 Rt ?Farmers carry - 3# - 180' ?Towel slide on wall - 20x flexion (not today) ?Pulley 2' flexion and scaption (not today) ? ?Therapeutic Activity ?- collecting information for goals, checking progress, and reviewing with patient ? ?[facility] Adult PT Treatment:                                                DATE: 03/01/2022 ?Therapeutic Exercise: ?UBE level 2 x 4 min fwd for warm up while taking subjective ?cross body stretch - 30'' x2  ?biceps curl - 2# - 2x10 ?UE ranger - flexion and scaption - 30x ea - 28'' ?Standing row RTB - 3x10 ?Shoulder ext YTB - 3x10 ?Standing IR/ER YTB 2x10 Rt ?Towel slide on wall - 20x flexion ?Pulley 2' flexion and scaption ? ?Modalities: ?Vasopneumatic (Game Ready)   ?Location:  right shoulder ?Time:  10 minutes ?Pressure:  medium ?Temperature:  38 degrees ? ?[facility] Adult PT Treatment:                                                DATE: 03/01/2022 ?Therapeutic Exercise: ?UBE level 2 x 4 min fwd for warm up while taking subjective ?cross body stretch - 30'' x2  ?biceps curl - 2# - x10 ?UE ranger - flexion and scaption - 30x ea - 28'' ?Standing row RTB - 3x10 ?Shoulder ext YTB - 3x10 ?Standing IR/ER YTB 2x10 Rt ?Towel slide on wall - 20x flexion ?Pulley 2' flexion and scaption ? ?Modalities: ?Vasopneumatic (Game Ready)   ?Location:  right shoulder ?Time:  10  minutes ?Pressure:  medium ?Temperature:  38 degrees ? ?[facility] Adult PT Treatment:                                                DATE: 02/25/2022 ?Therapeutic Exercise: ?UBE level 2 x 5 min fwd for warm up while taking

## 2022-03-08 ENCOUNTER — Ambulatory Visit: Payer: Self-pay

## 2022-03-08 DIAGNOSIS — G8929 Other chronic pain: Secondary | ICD-10-CM

## 2022-03-08 DIAGNOSIS — M25611 Stiffness of right shoulder, not elsewhere classified: Secondary | ICD-10-CM

## 2022-03-08 NOTE — Therapy (Signed)
?OUTPATIENT PHYSICAL THERAPY TREATMENT NOTE ? ? ?Patient Name: Meagan Flores ?MRN: [id number] ?DOB:[birth year], 56 y.o., female ?Today's Date: 03/08/2022 ? ?PCP: Elsie Stain, MD ?REFERRING PROVIDER: Aundra Dubin, PA-C ? ? PT End of Session - 03/08/22 0747   ? ? Visit Number 16   ? Number of Visits 16   ? Date for PT Re-Evaluation 04/28/22   ? Authorization Type self pay   ? PT Start Time [phone]   ? PT Stop Time [phone]   ? PT Time Calculation (min) 36 min   ? Activity Tolerance Patient tolerated treatment well;Patient limited by pain   ? Behavior During Therapy [facility] for tasks assessed/performed   ? ?  ?  ? ?  ? ? ? ?  ? ? ?Past Medical History:  ?Diagnosis Date  ? Acute pain of left shoulder 09/08/2017  ? Aneurysm ([place]) 03/18/2020  ? 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 COVID-19 virus infection 02/14/2020  ? 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) tear of right shoulder 09/30/2020  ? Essential hypertension   ? Glenohumeral arthritis 12/27/2014  ? History of stroke   ? History of TIA (transient ischemic attack) 02/14/2020  ? Hyperlipidemia   ? Hypothyroidism   ? Impingement syndrome of right shoulder 12/24/2016  ? Mixed hyperlipidemia   ? 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 Prediabetes   ? Preop cardiovascular exam 11/25/2020  ? Pulmonary hypertension, unspecified ([place]) 11/25/2020  ? 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  ? Rotator cuff impingement syndrome of right shoulder 09/30/2020  ? Skin rash 02/17/2021  ? Stroke [facility])   ? Tendinopathy of right biceps tendon 09/30/2020  ? Thyroid disease   ? TIA (transient ischemic attack) 04/15/2018  ? Tobacco use 09/28/2016  ? ?Past Surgical History:  ?Procedure Laterality Date  ? BICEPT TENODESIS Right 12/16/2021  ? Procedure: RIGHT BICEPS TENODESIS;  Surgeon: Leandrew Koyanagi, MD;  Location: [place];  Service: Orthopedics;  Laterality: Right;  ? PARTIAL HYSTERECTOMY    ? right arm sx    ? SHOULDER ARTHROSCOPY WITH BICEPSTENOTOMY Right  01/21/2021  ? Procedure: SHOULDER ARTHROSCOPY WITH BICEPSTENOTOMY;  Surgeon: Leandrew Koyanagi, MD;  Location: [place];  Service: Orthopedics;  Laterality: Right;  ? SHOULDER ARTHROSCOPY WITH DISTAL CLAVICLE RESECTION Right 01/12/2017  ? Procedure: RIGHT SHOULDER ARTHROSCOPY WITH  SUBACROMIAL DECOMPRESSION, DISTAL CLAVICLE EXCISION, extensive debridement;  Surgeon: Leandrew Koyanagi, MD;  Location: [place];  Service: Orthopedics;  Laterality: Right;  ? THYMECTOMY  2004  ? THYROIDECTOMY    ? ?Patient Active Problem List  ? Diagnosis Date Noted  ? Biceps tendonosis of right shoulder 02/10/2022  ? S/P arthroscopy of right shoulder 02/10/2022  ? Skin rash 02/17/2021  ? Preop cardiovascular exam 11/25/2020  ? Pulmonary hypertension, unspecified ([place]) 11/25/2020  ? Prediabetes   ? Hyperlipidemia   ? 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) tear of right shoulder 09/30/2020  ? Rotator cuff impingement syndrome of right shoulder 09/30/2020  ? 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 Tendinopathy of right biceps tendon 09/30/2020  ? 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  ? Aneurysm ([place]) 03/18/2020  ? History of TIA (transient ischemic attack) 02/14/2020  ? History of stroke   ? Mixed hyperlipidemia   ? Essential hypertension   ? Acute pain of left shoulder 09/08/2017  ? Impingement syndrome of right shoulder 12/24/2016  ? Tobacco use 09/28/2016  ? Glenohumeral arthritis 12/27/2014  ? 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 ? ?REFERRING DIAG: Referral diagnosis: Biceps  tendonosis of right shoulder [M67.813] ? ?THERAPY DIAG:  ?Chronic right shoulder pain ? ?Stiffness of right shoulder, not elsewhere classified ? ?Muscle weakness (generalized) ? ?Localized edema ? ?PERTINENT HISTORY: Hx of debridement and sub acromial decompression x2 (2018 and 2022). Hx of stroke, prediabetes ? ?PRECAUTIONS/RESTRICTIONS:  ? ?PRECAUTIONS: Shoulder biceps tenodesis 1/18 ?  ?WEIGHT BEARING  RESTRICTIONS Yes see precautions ? ?SUBJECTIVE:  ?Pt presents to PT with continued reports of R shoulder pain. Has been compliant with HEP with no adverse effect. Pt is ready to begin PT at this time.  ? ?Pain:  ?Are you having pain? Yes ?Pain location: Rt shoulder ?NPRS scale: current 6/10  ?Aggravating factors: shoulder movement ?Relieving factors: rest, ice ?Pain description: intermittent, sharp, and aching ?Severity: high ?Irritability: low ?Stage: Chronic ? ?OBJECTIVE: ? ?UPPER EXTREMITY AROM: ?  ?ROM Right ?01/06/2022 Left ?01/06/2022 Right ?01/28/2022 Right ?02/25/2022  ?Shoulder flexion   140 52p! 80p!  ?Shoulder abduction        ?Shoulder internal rotation        ?Shoulder external rotation        ?Functional IR        ?Functional ER        ?Shoulder extension        ?Elbow extension        ?Elbow flexion        ?  ?(Blank rows = not tested, N = WNL, * = concordant pain with testing) ?  ?UPPER EXTREMITY MMT: ?  ?MMT Right ?01/06/2022 Left ?01/06/2022  ?Shoulder flexion      ?Shoulder abduction (C5)      ?Shoulder ER      ?Shoulder IR      ?Middle trapezius      ?Lower trapezius      ?Shoulder extension      ?Grip strength      ?Cervical flexion (C1,C2)      ?Cervical S/B (C3)      ?Shoulder shrug (C4)      ?Elbow flexion (C6)      ?Elbow ext (C7)      ?Thumb ext (C8)      ?Finger abd (T1)      ?Grossly      ?  ?(Blank rows = not tested, score listed is out of 5 possible points.  N = WNL, D = diminished, C = clear for gross weakness with myotome testing, * = concordant pain with testing) ?  ?           ?  ?UPPER EXTREMITY PROM: ?  ?PROM Right ?01/06/2022 Left ?01/06/2022 Right ?01/28/2022 Right ?02/09/2022 Right ?02/11/2022  ?Shoulder flexion 95*   95p! At end range 85p! 97p!  ?Shoulder abduction         ?Shoulder internal rotation         ?Shoulder external rotation >40 degrees*       ?Functional IR         ?Functional ER         ?Shoulder extension         ?Elbow extension Full Full     ?Elbow flexion         ?  ?(Blank rows  = not tested, N = WNL, * = concordant pain with testing) ? ? ?ASTERISK SIGNS ? ? ?Asterisk Signs 2/17 2/17 2/28 3/10 3/14 3/16 3/21 3/30 4/5  ?Shoulder flexion PROM 100 100 (held d/t pain) 110 w/ pain  85 97     ?Shoulder flexion AROM  62 60  80p! 90 P!  ?Shoulder AAROM    90   95 98p!   ?           ?           ? ? ?TREATMENT  ?[facility] Adult PT Treatment:                                                DATE: 03/08/2022 ?Therapeutic Exercise: ?UBE level 2 x 4 min fwd for warm up while taking subjective ?cross body stretch - 45'' x2  ?biceps curl - 3# - 3x10 ?UE ranger - flexion and scaption - 30x ea - 33'' ?Standing row RTB - 3x10 ?Shoulder ext YTB - 3x10 ?Standing IR/ER YTB 2x10 Rt ?Farmers carry - 3# - 180' ?Pulley 2' flexion and scaption  ?Modalities: ?Vasopneumatic (Game Ready)   ?Location:  right shoulder ?Time:  10 minutes ?Pressure:  low ?Temperature:  38 degrees ? ?[facility] Adult PT Treatment:                                                DATE: 03/03/2022 ?Therapeutic Exercise: ?UBE level 2 x 4 min fwd for warm up while taking subjective ?cross body stretch - 45'' x2  ?biceps curl - 3# - 3x10 ?UE ranger - flexion and scaption - 30x ea - 33'' ?Standing row RTB - 3x10 ?Shoulder ext YTB - 3x10 ?Standing IR/ER YTB 2x10 Rt ?Farmers carry - 3# - 180' ?Towel slide on wall - 20x flexion (not today) ?Pulley 2' flexion and scaption (not today) ? ?Therapeutic Activity ?- collecting information for goals, checking progress, and reviewing with patient ? ?[facility] Adult PT Treatment:                                                DATE: 03/01/2022 ?Therapeutic Exercise: ?UBE level 2 x 4 min fwd for warm up while taking subjective ?cross body stretch - 30'' x2  ?biceps curl - 2# - 2x10 ?UE ranger - flexion and scaption - 30x ea - 28'' ?Standing row RTB - 3x10 ?Shoulder ext YTB - 3x10 ?Standing IR/ER YTB 2x10 Rt ?Towel slide on wall - 20x flexion ?Pulley 2' flexion and scaption ? ?Modalities: ?Vasopneumatic (Game Ready)   ?Location:   right shoulder ?Time:  10 minutes ?Pressure:  medium ?Temperature:  38 degrees ? ? ?HOME EXERCISE PROGRAM: ?Access Code: [email or web address] ?URL: [email or web address] ?Date: 01/15/2022 ?Prepared by:

## 2022-03-10 ENCOUNTER — Ambulatory Visit: Payer: Self-pay

## 2022-03-10 DIAGNOSIS — G8929 Other chronic pain: Secondary | ICD-10-CM

## 2022-03-10 DIAGNOSIS — M25611 Stiffness of right shoulder, not elsewhere classified: Secondary | ICD-10-CM

## 2022-03-10 NOTE — Therapy (Signed)
?OUTPATIENT PHYSICAL THERAPY TREATMENT NOTE ? ? ?Patient Name: Meagan Flores ?MRN: [id number] ?DOB:06/28/1966, 56 y.o., female ?Today's Date: 03/10/2022 ? ?PCP: Elsie Stain, MD ?REFERRING PROVIDER: Aundra Dubin, PA-C ? ? PT End of Session - 03/10/22 0910   ? ? Visit Number 17   ? Number of Visits 30   ? Date for PT Re-Evaluation 04/28/22   ? Authorization Type self pay   ? PT Start Time [id number]   ? PT Stop Time 0953   10 min vaso post session  ? PT Time Calculation (min) 38 min   ? Activity Tolerance Patient tolerated treatment well;Patient limited by pain   ? Behavior During Therapy [facility] for tasks assessed/performed   ? ?  ?  ? ?  ? ? ? ? ?  ? ? ?Past Medical History:  ?Diagnosis Date  ? Acute pain of left shoulder 09/08/2017  ? Aneurysm ([place]) 03/18/2020  ? 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 COVID-19 virus infection 02/14/2020  ? 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) tear of right shoulder 09/30/2020  ? Essential hypertension   ? Glenohumeral arthritis 12/27/2014  ? History of stroke   ? History of TIA (transient ischemic attack) 02/14/2020  ? Hyperlipidemia   ? Hypothyroidism   ? Impingement syndrome of right shoulder 12/24/2016  ? Mixed hyperlipidemia   ? 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 Prediabetes   ? Preop cardiovascular exam 11/25/2020  ? Pulmonary hypertension, unspecified ([place]) 11/25/2020  ? 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  ? Rotator cuff impingement syndrome of right shoulder 09/30/2020  ? Skin rash 02/17/2021  ? Stroke [facility])   ? Tendinopathy of right biceps tendon 09/30/2020  ? Thyroid disease   ? TIA (transient ischemic attack) 04/15/2018  ? Tobacco use 09/28/2016  ? ?Past Surgical History:  ?Procedure Laterality Date  ? BICEPT TENODESIS Right 12/16/2021  ? Procedure: RIGHT BICEPS TENODESIS;  Surgeon: Leandrew Koyanagi, MD;  Location: [place];  Service: Orthopedics;  Laterality: Right;  ? PARTIAL HYSTERECTOMY    ? right arm sx    ? SHOULDER  ARTHROSCOPY WITH BICEPSTENOTOMY Right 01/21/2021  ? Procedure: SHOULDER ARTHROSCOPY WITH BICEPSTENOTOMY;  Surgeon: Leandrew Koyanagi, MD;  Location: [place];  Service: Orthopedics;  Laterality: Right;  ? SHOULDER ARTHROSCOPY WITH DISTAL CLAVICLE RESECTION Right 01/12/2017  ? Procedure: RIGHT SHOULDER ARTHROSCOPY WITH  SUBACROMIAL DECOMPRESSION, DISTAL CLAVICLE EXCISION, extensive debridement;  Surgeon: Leandrew Koyanagi, MD;  Location: [place];  Service: Orthopedics;  Laterality: Right;  ? THYMECTOMY  2004  ? THYROIDECTOMY    ? ?Patient Active Problem List  ? Diagnosis Date Noted  ? Biceps tendonosis of right shoulder 02/10/2022  ? S/P arthroscopy of right shoulder 02/10/2022  ? Skin rash 02/17/2021  ? Preop cardiovascular exam 11/25/2020  ? Pulmonary hypertension, unspecified ([place]) 11/25/2020  ? Prediabetes   ? Hyperlipidemia   ? 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) tear of right shoulder 09/30/2020  ? Rotator cuff impingement syndrome of right shoulder 09/30/2020  ? 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 Tendinopathy of right biceps tendon 09/30/2020  ? 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  ? Aneurysm ([place]) 03/18/2020  ? History of TIA (transient ischemic attack) 02/14/2020  ? History of stroke   ? Mixed hyperlipidemia   ? Essential hypertension   ? Acute pain of left shoulder 09/08/2017  ? Impingement syndrome of right shoulder 12/24/2016  ? Tobacco use 09/28/2016  ? Glenohumeral arthritis 12/27/2014  ? 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 ? ?  REFERRING DIAG: Referral diagnosis: Biceps tendonosis of right shoulder [M67.813] ? ?THERAPY DIAG:  ?Chronic right shoulder pain ? ?Stiffness of right shoulder, not elsewhere classified ? ?Muscle weakness (generalized) ? ?Localized edema ? ?PERTINENT HISTORY: Hx of debridement and sub acromial decompression x2 (2018 and 2022). Hx of stroke, prediabetes ? ?PRECAUTIONS/RESTRICTIONS:  ? ?PRECAUTIONS: Shoulder  biceps tenodesis 1/18 ?  ?WEIGHT BEARING RESTRICTIONS Yes see precautions ? ?SUBJECTIVE:  ?Pt presents to PT with continued reports of R shoulder pain and discomfort. Pt has been compliant with HEP with no adverse effect. Pt is ready to begin PT at this time.  ? ?Pain:  ?Are you having pain? Yes ?Pain location: Rt shoulder ?NPRS scale: current 6/10  ?Aggravating factors: shoulder movement ?Relieving factors: rest, ice ?Pain description: intermittent, sharp, and aching ?Severity: high ?Irritability: low ?Stage: Chronic ? ?OBJECTIVE: ? ?UPPER EXTREMITY AROM: ?  ?ROM Right ?01/06/2022 Left ?01/06/2022 Right ?01/28/2022 Right ?02/25/2022  ?Shoulder flexion   140 52p! 80p!  ?Shoulder abduction        ?Shoulder internal rotation        ?Shoulder external rotation        ?Functional IR        ?Functional ER        ?Shoulder extension        ?Elbow extension        ?Elbow flexion        ?  ?(Blank rows = not tested, N = WNL, * = concordant pain with testing) ?  ?UPPER EXTREMITY MMT: ?  ?MMT Right ?01/06/2022 Left ?01/06/2022  ?Shoulder flexion      ?Shoulder abduction (C5)      ?Shoulder ER      ?Shoulder IR      ?Middle trapezius      ?Lower trapezius      ?Shoulder extension      ?Grip strength      ?Cervical flexion (C1,C2)      ?Cervical S/B (C3)      ?Shoulder shrug (C4)      ?Elbow flexion (C6)      ?Elbow ext (C7)      ?Thumb ext (C8)      ?Finger abd (T1)      ?Grossly      ?  ?(Blank rows = not tested, score listed is out of 5 possible points.  N = WNL, D = diminished, C = clear for gross weakness with myotome testing, * = concordant pain with testing) ?  ?           ?  ?UPPER EXTREMITY PROM: ?  ?PROM Right ?01/06/2022 Left ?01/06/2022 Right ?01/28/2022 Right ?02/09/2022 Right ?02/11/2022  ?Shoulder flexion 95*   95p! At end range 85p! 97p!  ?Shoulder abduction         ?Shoulder internal rotation         ?Shoulder external rotation >40 degrees*       ?Functional IR         ?Functional ER         ?Shoulder extension         ?Elbow  extension Full Full     ?Elbow flexion         ?  ?(Blank rows = not tested, N = WNL, * = concordant pain with testing) ? ? ?ASTERISK SIGNS ? ? ?Asterisk Signs 2/17 2/17 2/28 3/10 3/14 3/16 3/21 3/30 4/5  ?Shoulder flexion PROM 100 100 (held d/t pain) 110 w/ pain  85 97     ?  Shoulder flexion AROM     62 60  80p! 90 P!  ?Shoulder AAROM    90   95 98p!   ?           ?           ? ? ?TREATMENT  ?[facility] Adult PT Treatment:                                                DATE: 03/10/2022 ?Therapeutic Exercise: ?UBE level 2 x 4 min fwd for warm up while taking subjective ?cross body stretch - 45'' x2  ?biceps curl - 3# - 3x10 ?UE ranger - flexion and scaption - 30x ea - 33'' ?Standing row GTB - 2x10 ?Shoulder ext YTB - 3x10 ?Standing IR/ER YTB 2x10 Rt ?Farmers carry - 3# - 180' ?Pulley 2' flexion and scaption ?Wall slide flex x 20 R ?Modalities: ?Vasopneumatic (Game Ready)   ?Location: right shoulder ?Time:  10 minutes ?Pressure:  low ?Temperature:  38 degrees ? ?[facility] Adult PT Treatment:                                                DATE: 03/08/2022 ?Therapeutic Exercise: ?UBE level 2 x 4 min fwd for warm up while taking subjective ?cross body stretch - 45'' x2  ?biceps curl - 3# - 3x10 ?UE ranger - flexion and scaption - 30x ea - 33'' ?Standing row RTB - 3x10 ?Shoulder ext YTB - 3x10 ?Standing IR/ER YTB 2x10 Rt ?Farmers carry - 3# - 180' ?Pulley 2' flexion and scaption  ?Modalities: ?Vasopneumatic (Game Ready)   ?Location:  right shoulder ?Time:  10 minutes ?Pressure:  low ?Temperature:  38 degrees ? ?[facility] Adult PT Treatment:                                                DATE: 03/03/2022 ?Therapeutic Exercise: ?UBE level 2 x 4 min fwd for warm up while taking subjective ?cross body stretch - 45'' x2  ?biceps curl - 3# - 3x10 ?UE ranger - flexion and scaption - 30x ea - 33'' ?Standing row RTB - 3x10 ?Shoulder ext YTB - 3x10 ?Standing IR/ER YTB 2x10 Rt ?Farmers carry - 3# - 180' ?Towel slide on wall - 20x flexion (not  today) ?Pulley 2' flexion and scaption (not today) ? ?Therapeutic Activity ?- collecting information for goals, checking progress, and reviewing with patient ? ? ? ?HOME EXERCISE PROGRAM: ?Access Code: [email or web address] ?URL: [email or web address]

## 2022-03-15 ENCOUNTER — Ambulatory Visit: Payer: Self-pay

## 2022-03-15 DIAGNOSIS — M25611 Stiffness of right shoulder, not elsewhere classified: Secondary | ICD-10-CM

## 2022-03-15 DIAGNOSIS — G8929 Other chronic pain: Secondary | ICD-10-CM

## 2022-03-15 NOTE — Therapy (Signed)
?OUTPATIENT PHYSICAL THERAPY TREATMENT NOTE ? ? ?Patient Name: Meagan Flores ?MRN: [id number] ?DOB:January 13, 1966, 56 y.o., female ?Today's Date: 03/15/2022 ? ?PCP: Elsie Stain, MD ?REFERRING PROVIDER: Aundra Dubin, PA-C ? ? PT End of Session - 03/15/22 0747   ? ? Visit Number [id number]   ? Number of Visits 30   ? Date for PT Re-Evaluation 04/28/22   ? Authorization Type self pay   ? PT Start Time [phone]   ? PT Stop Time [phone]   ? PT Time Calculation (min) 36 min   ? Activity Tolerance Patient tolerated treatment well;Patient limited by pain   ? Behavior During Therapy [facility] for tasks assessed/performed   ? ?  ?  ? ?  ? ? ? ? ? ?  ? ? ?Past Medical History:  ?Diagnosis Date  ? Acute pain of left shoulder 09/08/2017  ? Aneurysm ([place]) 03/18/2020  ? 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 COVID-19 virus infection 02/14/2020  ? 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) tear of right shoulder 09/30/2020  ? Essential hypertension   ? Glenohumeral arthritis 12/27/2014  ? History of stroke   ? History of TIA (transient ischemic attack) 02/14/2020  ? Hyperlipidemia   ? Hypothyroidism   ? Impingement syndrome of right shoulder 12/24/2016  ? Mixed hyperlipidemia   ? 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 Prediabetes   ? Preop cardiovascular exam 11/25/2020  ? Pulmonary hypertension, unspecified ([place]) 11/25/2020  ? 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  ? Rotator cuff impingement syndrome of right shoulder 09/30/2020  ? Skin rash 02/17/2021  ? Stroke [facility])   ? Tendinopathy of right biceps tendon 09/30/2020  ? Thyroid disease   ? TIA (transient ischemic attack) 04/15/2018  ? Tobacco use 09/28/2016  ? ?Past Surgical History:  ?Procedure Laterality Date  ? BICEPT TENODESIS Right 12/16/2021  ? Procedure: RIGHT BICEPS TENODESIS;  Surgeon: Leandrew Koyanagi, MD;  Location: [place];  Service: Orthopedics;  Laterality: Right;  ? PARTIAL HYSTERECTOMY    ? right arm sx    ? SHOULDER ARTHROSCOPY WITH BICEPSTENOTOMY  Right 01/21/2021  ? Procedure: SHOULDER ARTHROSCOPY WITH BICEPSTENOTOMY;  Surgeon: Leandrew Koyanagi, MD;  Location: [place];  Service: Orthopedics;  Laterality: Right;  ? SHOULDER ARTHROSCOPY WITH DISTAL CLAVICLE RESECTION Right 01/12/2017  ? Procedure: RIGHT SHOULDER ARTHROSCOPY WITH  SUBACROMIAL DECOMPRESSION, DISTAL CLAVICLE EXCISION, extensive debridement;  Surgeon: Leandrew Koyanagi, MD;  Location: [place];  Service: Orthopedics;  Laterality: Right;  ? THYMECTOMY  2004  ? THYROIDECTOMY    ? ?Patient Active Problem List  ? Diagnosis Date Noted  ? Biceps tendonosis of right shoulder 02/10/2022  ? S/P arthroscopy of right shoulder 02/10/2022  ? Skin rash 02/17/2021  ? Preop cardiovascular exam 11/25/2020  ? Pulmonary hypertension, unspecified ([place]) 11/25/2020  ? Prediabetes   ? Hyperlipidemia   ? 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) tear of right shoulder 09/30/2020  ? Rotator cuff impingement syndrome of right shoulder 09/30/2020  ? 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 Tendinopathy of right biceps tendon 09/30/2020  ? 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  ? Aneurysm ([place]) 03/18/2020  ? History of TIA (transient ischemic attack) 02/14/2020  ? History of stroke   ? Mixed hyperlipidemia   ? Essential hypertension   ? Acute pain of left shoulder 09/08/2017  ? Impingement syndrome of right shoulder 12/24/2016  ? Tobacco use 09/28/2016  ? Glenohumeral arthritis 12/27/2014  ? 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 ? ?REFERRING DIAG: Referral  diagnosis: Biceps tendonosis of right shoulder [M67.813] ? ?THERAPY DIAG:  ?Chronic right shoulder pain ? ?Stiffness of right shoulder, not elsewhere classified ? ?Muscle weakness (generalized) ? ?Localized edema ? ?PERTINENT HISTORY: Hx of debridement and sub acromial decompression x2 (2018 and 2022). Hx of stroke, prediabetes ? ?PRECAUTIONS/RESTRICTIONS:  ? ?PRECAUTIONS: Shoulder biceps tenodesis 1/18 ?  ?WEIGHT  BEARING RESTRICTIONS Yes see precautions ? ?SUBJECTIVE:  ?Pt presents to PT with continued reports of R shoulder pain and discomfort. Has been compliant with her HEP with no adverse effect. Pt is ready to begin PT at this time.  ? ?Pain:  ?Are you having pain? Yes ?Pain location: Rt shoulder ?NPRS scale: current 6/10  ?Aggravating factors: shoulder movement ?Relieving factors: rest, ice ?Pain description: intermittent, sharp, and aching ?Severity: high ?Irritability: low ?Stage: Chronic ? ?OBJECTIVE: ? ?UPPER EXTREMITY AROM: ?  ?ROM Right ?01/06/2022 Left ?01/06/2022 Right ?01/28/2022 Right ?02/25/2022  ?Shoulder flexion   140 52p! 80p!  ?Shoulder abduction        ?Shoulder internal rotation        ?Shoulder external rotation        ?Functional IR        ?Functional ER        ?Shoulder extension        ?Elbow extension        ?Elbow flexion        ?  ?(Blank rows = not tested, N = WNL, * = concordant pain with testing) ?  ?UPPER EXTREMITY MMT: ?  ?MMT Right ?01/06/2022 Left ?01/06/2022  ?Shoulder flexion      ?Shoulder abduction (C5)      ?Shoulder ER      ?Shoulder IR      ?Middle trapezius      ?Lower trapezius      ?Shoulder extension      ?Grip strength      ?Cervical flexion (C1,C2)      ?Cervical S/B (C3)      ?Shoulder shrug (C4)      ?Elbow flexion (C6)      ?Elbow ext (C7)      ?Thumb ext (C8)      ?Finger abd (T1)      ?Grossly      ?  ?(Blank rows = not tested, score listed is out of 5 possible points.  N = WNL, D = diminished, C = clear for gross weakness with myotome testing, * = concordant pain with testing) ?  ?           ?  ?UPPER EXTREMITY PROM: ?  ?PROM Right ?01/06/2022 Left ?01/06/2022 Right ?01/28/2022 Right ?02/09/2022 Right ?02/11/2022  ?Shoulder flexion 95*   95p! At end range 85p! 97p!  ?Shoulder abduction         ?Shoulder internal rotation         ?Shoulder external rotation >40 degrees*       ?Functional IR         ?Functional ER         ?Shoulder extension         ?Elbow extension Full Full     ?Elbow  flexion         ?  ?(Blank rows = not tested, N = WNL, * = concordant pain with testing) ? ? ?ASTERISK SIGNS ? ? ?Asterisk Signs 2/17 2/17 2/28 3/10 3/14 3/16 3/21 3/30 4/5  ?Shoulder flexion PROM 100 100 (held d/t pain) 110 w/ pain  85 97     ?  Shoulder flexion AROM     62 60  80p! 90 P!  ?Shoulder AAROM    90   95 98p!   ? ? ?TREATMENT  ?[facility] Adult PT Treatment:                                                DATE: 03/15/2022 ?Therapeutic Exercise: ?UBE level 2 x 4 min fwd for warm up while taking subjective ?cross body stretch - 2x30" ?biceps curl - 3# - 3x10 ?UE ranger - flexion - 30x ea - 33'' ?Standing row GTB - 3x10 ?Shoulder ext GTB - 2x10 ?Standing IR/ER YTB 2x10 Rt ?Farmers carry - 3# - 180' ?Pulley 2' flexion and scaption ?Wall slide flex x 20 R ?Modalities: ?Vasopneumatic (Game Ready)   ?Location: right shoulder ?Time:  10 minutes ?Pressure:  low ?Temperature:  38 degrees ? ?[facility] Adult PT Treatment:                                                DATE: 03/10/2022 ?Therapeutic Exercise: ?UBE level 2 x 4 min fwd for warm up while taking subjective ?cross body stretch - 45'' x2  ?biceps curl - 3# - 3x10 ?UE ranger - flexion and scaption - 30x ea - 33'' ?Standing row GTB - 2x10 ?Shoulder ext YTB - 3x10 ?Standing IR/ER YTB 2x10 Rt ?Farmers carry - 3# - 180' ?Pulley 2' flexion and scaption ?Wall slide flex x 20 R ?Modalities: ?Vasopneumatic (Game Ready)   ?Location: right shoulder ?Time:  10 minutes ?Pressure:  low ?Temperature:  38 degrees ? ?[facility] Adult PT Treatment:                                                DATE: 03/08/2022 ?Therapeutic Exercise: ?UBE level 2 x 4 min fwd for warm up while taking subjective ?cross body stretch - 45'' x2  ?biceps curl - 3# - 3x10 ?UE ranger - flexion and scaption - 30x ea - 33'' ?Standing row RTB - 3x10 ?Shoulder ext YTB - 3x10 ?Standing IR/ER YTB 2x10 Rt ?Farmers carry - 3# - 180' ?Pulley 2' flexion and scaption  ?Modalities: ?Vasopneumatic (Game Ready)   ?Location:  right  shoulder ?Time:  10 minutes ?Pressure:  low ?Temperature:  38 degrees ? ?HOME EXERCISE PROGRAM: ?Access Code: [email or web address] ?URL: [email or web address] ?Date: 01/15/2022 ?Prepared by: Shearon Balo ?

## 2022-03-17 ENCOUNTER — Ambulatory Visit: Payer: Self-pay

## 2022-03-17 DIAGNOSIS — M25611 Stiffness of right shoulder, not elsewhere classified: Secondary | ICD-10-CM

## 2022-03-17 DIAGNOSIS — G8929 Other chronic pain: Secondary | ICD-10-CM

## 2022-03-17 NOTE — Therapy (Signed)
?OUTPATIENT PHYSICAL THERAPY TREATMENT NOTE ? ? ?Patient Name: Meagan Flores ?MRN: [id number] ?DOB:08-09-1966, 56 y.o., female ?Today's Date: 03/17/2022 ? ?PCP: Elsie Stain, MD ?REFERRING PROVIDER: Aundra Dubin, PA-C ? ? PT End of Session - 03/17/22 0915   ? ? Visit Number 19   ? Number of Visits 30   ? Date for PT Re-Evaluation 04/28/22   ? Authorization Type self pay   ? PT Start Time [id number]   ? PT Stop Time [phone]   ? PT Time Calculation (min) 37 min   ? Activity Tolerance Patient tolerated treatment well;Patient limited by pain   ? Behavior During Therapy [facility] for tasks assessed/performed   ? ?  ?  ? ?  ? ? ? ? ? ? ?  ? ? ?Past Medical History:  ?Diagnosis Date  ? Acute pain of left shoulder 09/08/2017  ? Aneurysm ([place]) 03/18/2020  ? 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 COVID-19 virus infection 02/14/2020  ? 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) tear of right shoulder 09/30/2020  ? Essential hypertension   ? Glenohumeral arthritis 12/27/2014  ? History of stroke   ? History of TIA (transient ischemic attack) 02/14/2020  ? Hyperlipidemia   ? Hypothyroidism   ? Impingement syndrome of right shoulder 12/24/2016  ? Mixed hyperlipidemia   ? 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 Prediabetes   ? Preop cardiovascular exam 11/25/2020  ? Pulmonary hypertension, unspecified ([place]) 11/25/2020  ? 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  ? Rotator cuff impingement syndrome of right shoulder 09/30/2020  ? Skin rash 02/17/2021  ? Stroke [facility])   ? Tendinopathy of right biceps tendon 09/30/2020  ? Thyroid disease   ? TIA (transient ischemic attack) 04/15/2018  ? Tobacco use 09/28/2016  ? ?Past Surgical History:  ?Procedure Laterality Date  ? BICEPT TENODESIS Right 12/16/2021  ? Procedure: RIGHT BICEPS TENODESIS;  Surgeon: Leandrew Koyanagi, MD;  Location: [place];  Service: Orthopedics;  Laterality: Right;  ? PARTIAL HYSTERECTOMY    ? right arm sx    ? SHOULDER ARTHROSCOPY WITH BICEPSTENOTOMY  Right 01/21/2021  ? Procedure: SHOULDER ARTHROSCOPY WITH BICEPSTENOTOMY;  Surgeon: Leandrew Koyanagi, MD;  Location: [place];  Service: Orthopedics;  Laterality: Right;  ? SHOULDER ARTHROSCOPY WITH DISTAL CLAVICLE RESECTION Right 01/12/2017  ? Procedure: RIGHT SHOULDER ARTHROSCOPY WITH  SUBACROMIAL DECOMPRESSION, DISTAL CLAVICLE EXCISION, extensive debridement;  Surgeon: Leandrew Koyanagi, MD;  Location: [place];  Service: Orthopedics;  Laterality: Right;  ? THYMECTOMY  2004  ? THYROIDECTOMY    ? ?Patient Active Problem List  ? Diagnosis Date Noted  ? Biceps tendonosis of right shoulder 02/10/2022  ? S/P arthroscopy of right shoulder 02/10/2022  ? Skin rash 02/17/2021  ? Preop cardiovascular exam 11/25/2020  ? Pulmonary hypertension, unspecified ([place]) 11/25/2020  ? Prediabetes   ? Hyperlipidemia   ? 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) tear of right shoulder 09/30/2020  ? Rotator cuff impingement syndrome of right shoulder 09/30/2020  ? 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 Tendinopathy of right biceps tendon 09/30/2020  ? 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  ? Aneurysm ([place]) 03/18/2020  ? History of TIA (transient ischemic attack) 02/14/2020  ? History of stroke   ? Mixed hyperlipidemia   ? Essential hypertension   ? Acute pain of left shoulder 09/08/2017  ? Impingement syndrome of right shoulder 12/24/2016  ? Tobacco use 09/28/2016  ? Glenohumeral arthritis 12/27/2014  ? 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 ? ?REFERRING DIAG:  Referral diagnosis: Biceps tendonosis of right shoulder [M67.813] ? ?THERAPY DIAG:  ?Chronic right shoulder pain ? ?Stiffness of right shoulder, not elsewhere classified ? ?Muscle weakness (generalized) ? ?Localized edema ? ?PERTINENT HISTORY: Hx of debridement and sub acromial decompression x2 (2018 and 2022). Hx of stroke, prediabetes ? ?PRECAUTIONS/RESTRICTIONS:  ? ?PRECAUTIONS: Shoulder biceps tenodesis 1/18 ?  ?WEIGHT  BEARING RESTRICTIONS Yes see precautions ? ?SUBJECTIVE:  ?Pt presents to PT with continued R shoulder pain. Has maintained HEP compliance. Pt is ready to begin PT at this time. ? ?Pain:  ?Are you having pain? Yes ?Pain location: Rt shoulder ?NPRS scale: current 6/10  ?Aggravating factors: shoulder movement ?Relieving factors: rest, ice ?Pain description: intermittent, sharp, and aching ?Severity: high ?Irritability: low ?Stage: Chronic ? ?OBJECTIVE: ? ?UPPER EXTREMITY AROM: ?  ?ROM Right ?01/06/2022 Left ?01/06/2022 Right ?01/28/2022 Right ?02/25/2022  ?Shoulder flexion   140 52p! 80p!  ?Shoulder abduction        ?Shoulder internal rotation        ?Shoulder external rotation        ?Functional IR        ?Functional ER        ?Shoulder extension        ?Elbow extension        ?Elbow flexion        ?  ?(Blank rows = not tested, N = WNL, * = concordant pain with testing) ?  ?UPPER EXTREMITY MMT: ?  ?MMT Right ?01/06/2022 Left ?01/06/2022  ?Shoulder flexion      ?Shoulder abduction (C5)      ?Shoulder ER      ?Shoulder IR      ?Middle trapezius      ?Lower trapezius      ?Shoulder extension      ?Grip strength      ?Cervical flexion (C1,C2)      ?Cervical S/B (C3)      ?Shoulder shrug (C4)      ?Elbow flexion (C6)      ?Elbow ext (C7)      ?Thumb ext (C8)      ?Finger abd (T1)      ?Grossly      ?  ?(Blank rows = not tested, score listed is out of 5 possible points.  N = WNL, D = diminished, C = clear for gross weakness with myotome testing, * = concordant pain with testing) ?  ?           ?  ?UPPER EXTREMITY PROM: ?  ?PROM Right ?01/06/2022 Left ?01/06/2022 Right ?01/28/2022 Right ?02/09/2022 Right ?02/11/2022  ?Shoulder flexion 95*   95p! At end range 85p! 97p!  ?Shoulder abduction         ?Shoulder internal rotation         ?Shoulder external rotation >40 degrees*       ?Functional IR         ?Functional ER         ?Shoulder extension         ?Elbow extension Full Full     ?Elbow flexion         ?  ?(Blank rows = not tested, N = WNL, *  = concordant pain with testing) ? ? ?ASTERISK SIGNS ? ? ?Asterisk Signs 2/17 2/17 2/28 3/10 3/14 3/16 3/21 3/30 4/5  ?Shoulder flexion PROM 100 100 (held d/t pain) 110 w/ pain  85 97     ?Shoulder flexion AROM     62 60  80p! 90 P!  ?Shoulder AAROM    90   95 98p!   ? ? ?TREATMENT  ?[facility] Adult PT Treatment:                                                DATE: 03/17/2022 ?Therapeutic Exercise: ?UBE level 2 x 4 min while taking subjective ?cross body stretch - 2x30" ?Prone row R 2x10  ?Prone ext 2x10 R 2# ?S/L ER 3x10 R ?biceps curl - 3# - 3x10 ?UE ranger - flexion - 30x ea - 33'' ?Farmers carry - 3# - 180' ?Pulley 2' flexion and scaption ?Wall slide flex x 20 R ?Modalities: ?Vasopneumatic (Game Ready)   ?Location: right shoulder ?Time:  10 minutes ?Pressure:  low ?Temperature:  38 degrees ? ?[facility] Adult PT Treatment:                                                DATE: 03/15/2022 ?Therapeutic Exercise: ?UBE level 2 x 4 min fwd for warm up while taking subjective ?cross body stretch - 2x30" ?biceps curl - 3# - 3x10 ?UE ranger - flexion - 30x ea - 33'' ?Standing row GTB - 3x10 ?Shoulder ext GTB - 2x10 ?Standing IR/ER YTB 2x10 Rt ?Farmers carry - 3# - 180' ?Pulley 2' flexion and scaption ?Wall slide flex x 20 R ?Modalities: ?Vasopneumatic (Game Ready)   ?Location: right shoulder ?Time:  10 minutes ?Pressure:  low ?Temperature:  38 degrees ? ?[facility] Adult PT Treatment:                                                DATE: 03/10/2022 ?Therapeutic Exercise: ?UBE level 2 x 4 min fwd for warm up while taking subjective ?cross body stretch - 45'' x2  ?biceps curl - 3# - 3x10 ?UE ranger - flexion and scaption - 30x ea - 33'' ?Standing row GTB - 2x10 ?Shoulder ext YTB - 3x10 ?Standing IR/ER YTB 2x10 Rt ?Farmers carry - 3# - 180' ?Pulley 2' flexion and scaption ?Wall slide flex x 20 R ?Modalities: ?Vasopneumatic (Game Ready)   ?Location: right shoulder ?Time:  10 minutes ?Pressure:  low ?Temperature:  38 degrees ? ?HOME EXERCISE  PROGRAM: ?Access Code: [email or web address] ?URL: [email or web address] ?Date: 01/15/2022 ?Prepared by: Shearon Balo ? ?Exercises ?Forearm Strengthening with Ball Squeeze - 3 x daily - 7 x weekly - 3 se

## 2022-03-22 ENCOUNTER — Ambulatory Visit: Payer: Self-pay

## 2022-03-22 DIAGNOSIS — G8929 Other chronic pain: Secondary | ICD-10-CM

## 2022-03-22 DIAGNOSIS — M25611 Stiffness of right shoulder, not elsewhere classified: Secondary | ICD-10-CM

## 2022-03-22 NOTE — Therapy (Signed)
?OUTPATIENT PHYSICAL THERAPY TREATMENT NOTE ? ? ?Patient Name: Meagan Flores ?MRN: [id number] ?DOB:03-Jan-1966, 56 y.o., female ?Today's Date: 03/22/2022 ? ?PCP: Elsie Stain, MD ?REFERRING PROVIDER: Aundra Dubin, PA-C ? ? PT End of Session - 03/22/22 0748   ? ? Visit Number 20   ? Number of Visits 30   ? Date for PT Re-Evaluation 04/28/22   ? Authorization Type self pay   ? PT Start Time [phone]   ? PT Stop Time [phone]   ? PT Time Calculation (min) 34 min   ? Activity Tolerance Patient tolerated treatment well;Patient limited by pain   ? Behavior During Therapy [facility] for tasks assessed/performed   ? ?  ?  ? ?  ? ? ? ? ? ? ? ?  ? ? ?Past Medical History:  ?Diagnosis Date  ? Acute pain of left shoulder 09/08/2017  ? Aneurysm ([place]) 03/18/2020  ? 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 COVID-19 virus infection 02/14/2020  ? 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) tear of right shoulder 09/30/2020  ? Essential hypertension   ? Glenohumeral arthritis 12/27/2014  ? History of stroke   ? History of TIA (transient ischemic attack) 02/14/2020  ? Hyperlipidemia   ? Hypothyroidism   ? Impingement syndrome of right shoulder 12/24/2016  ? Mixed hyperlipidemia   ? 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 Prediabetes   ? Preop cardiovascular exam 11/25/2020  ? Pulmonary hypertension, unspecified ([place]) 11/25/2020  ? 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  ? Rotator cuff impingement syndrome of right shoulder 09/30/2020  ? Skin rash 02/17/2021  ? Stroke [facility])   ? Tendinopathy of right biceps tendon 09/30/2020  ? Thyroid disease   ? TIA (transient ischemic attack) 04/15/2018  ? Tobacco use 09/28/2016  ? ?Past Surgical History:  ?Procedure Laterality Date  ? BICEPT TENODESIS Right 12/16/2021  ? Procedure: RIGHT BICEPS TENODESIS;  Surgeon: Leandrew Koyanagi, MD;  Location: [place];  Service: Orthopedics;  Laterality: Right;  ? PARTIAL HYSTERECTOMY    ? right arm sx    ? SHOULDER ARTHROSCOPY WITH  BICEPSTENOTOMY Right 01/21/2021  ? Procedure: SHOULDER ARTHROSCOPY WITH BICEPSTENOTOMY;  Surgeon: Leandrew Koyanagi, MD;  Location: [place];  Service: Orthopedics;  Laterality: Right;  ? SHOULDER ARTHROSCOPY WITH DISTAL CLAVICLE RESECTION Right 01/12/2017  ? Procedure: RIGHT SHOULDER ARTHROSCOPY WITH  SUBACROMIAL DECOMPRESSION, DISTAL CLAVICLE EXCISION, extensive debridement;  Surgeon: Leandrew Koyanagi, MD;  Location: [place];  Service: Orthopedics;  Laterality: Right;  ? THYMECTOMY  2004  ? THYROIDECTOMY    ? ?Patient Active Problem List  ? Diagnosis Date Noted  ? Biceps tendonosis of right shoulder 02/10/2022  ? S/P arthroscopy of right shoulder 02/10/2022  ? Skin rash 02/17/2021  ? Preop cardiovascular exam 11/25/2020  ? Pulmonary hypertension, unspecified ([place]) 11/25/2020  ? Prediabetes   ? Hyperlipidemia   ? 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) tear of right shoulder 09/30/2020  ? Rotator cuff impingement syndrome of right shoulder 09/30/2020  ? 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 Tendinopathy of right biceps tendon 09/30/2020  ? 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  ? Aneurysm ([place]) 03/18/2020  ? History of TIA (transient ischemic attack) 02/14/2020  ? History of stroke   ? Mixed hyperlipidemia   ? Essential hypertension   ? Acute pain of left shoulder 09/08/2017  ? Impingement syndrome of right shoulder 12/24/2016  ? Tobacco use 09/28/2016  ? Glenohumeral arthritis 12/27/2014  ? 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 ? ?REFERRING  DIAG: Referral diagnosis: Biceps tendonosis of right shoulder [M67.813] ? ?THERAPY DIAG:  ?Chronic right shoulder pain ? ?Stiffness of right shoulder, not elsewhere classified ? ?Muscle weakness (generalized) ? ?Localized edema ? ?PERTINENT HISTORY: Hx of debridement and sub acromial decompression x2 (2018 and 2022). Hx of stroke, prediabetes ? ?PRECAUTIONS/RESTRICTIONS:  ? ?PRECAUTIONS: Shoulder biceps tenodesis  1/18 ?  ?WEIGHT BEARING RESTRICTIONS Yes see precautions ? ?SUBJECTIVE:  ?Pt presents to PT with continued pain in R shoulder. Has remained compliant with HEP. She has follow up visit with MD on 03/24/2022. Pt is ready to begin PT at this time.  ? ?Pain:  ?Are you having pain? Yes ?Pain location: Rt shoulder ?NPRS scale: current 6/10  ?Aggravating factors: shoulder movement ?Relieving factors: rest, ice ?Pain description: intermittent, sharp, and aching ?Severity: high ?Irritability: low ?Stage: Chronic ? ?OBJECTIVE: ? ?UPPER EXTREMITY AROM: ?  ?ROM Right ?01/06/2022 Left ?01/06/2022 Right ?01/28/2022 Right ?02/25/2022  ?Shoulder flexion   140 52p! 80p!  ?Shoulder abduction        ?Shoulder internal rotation        ?Shoulder external rotation        ?Functional IR        ?Functional ER        ?Shoulder extension        ?Elbow extension        ?Elbow flexion        ?  ?(Blank rows = not tested, N = WNL, * = concordant pain with testing) ?  ?UPPER EXTREMITY MMT: ?  ?MMT Right ?01/06/2022 Left ?01/06/2022  ?Shoulder flexion      ?Shoulder abduction (C5)      ?Shoulder ER      ?Shoulder IR      ?Middle trapezius      ?Lower trapezius      ?Shoulder extension      ?Grip strength      ?Cervical flexion (C1,C2)      ?Cervical S/B (C3)      ?Shoulder shrug (C4)      ?Elbow flexion (C6)      ?Elbow ext (C7)      ?Thumb ext (C8)      ?Finger abd (T1)      ?Grossly      ?  ?(Blank rows = not tested, score listed is out of 5 possible points.  N = WNL, D = diminished, C = clear for gross weakness with myotome testing, * = concordant pain with testing) ?  ?           ?  ?UPPER EXTREMITY PROM: ?  ?PROM Right ?01/06/2022 Left ?01/06/2022 Right ?01/28/2022 Right ?02/09/2022 Right ?02/11/2022  ?Shoulder flexion 95*   95p! At end range 85p! 97p!  ?Shoulder abduction         ?Shoulder internal rotation         ?Shoulder external rotation >40 degrees*       ?Functional IR         ?Functional ER         ?Shoulder extension         ?Elbow extension Full Full      ?Elbow flexion         ?  ?(Blank rows = not tested, N = WNL, * = concordant pain with testing) ? ? ?ASTERISK SIGNS ? ? ?Asterisk Signs 2/17 2/17 2/28 3/10 3/14 3/16 3/21 3/30 4/5  ?Shoulder flexion PROM 100 100 (held d/t pain) 110 w/ pain  85 97     ?  Shoulder flexion AROM     62 60  80p! 90 P!  ?Shoulder AAROM    90   95 98p!   ? ? ?TREATMENT  ?[facility] Adult PT Treatment:                                                DATE: 03/22/2022 ?Therapeutic Exercise: ?UBE level 2 x 4 min while taking subjective ?cross body stretch - 2x30" ?Prone row R 3x10 2# ?Prone ext 2x10 R 2# ?S/L ER 2x10 R ?biceps curl - 3# - 3x10 ?R shoulder IR 2x10 RTB ?Farmers carry - 3# - 180' ?Pulley 2' flexion and scaption ?Wall slide flex x 20 R ?Modalities: ?Vasopneumatic (Game Ready)   ?Location: right shoulder ?Time:  10 minutes ?Pressure:  low ?Temperature:  38 degrees ? ?[facility] Adult PT Treatment:                                                DATE: 03/17/2022 ?Therapeutic Exercise: ?UBE level 2 x 4 min while taking subjective ?cross body stretch - 2x30" ?Prone row R 2x10  ?Prone ext 2x10 R 2# ?S/L ER 3x10 R ?biceps curl - 3# - 3x10 ?UE ranger - flexion - 30x ea - 33'' ?Farmers carry - 3# - 180' ?Pulley 2' flexion and scaption ?Wall slide flex x 20 R ?Modalities: ?Vasopneumatic (Game Ready)   ?Location: right shoulder ?Time:  10 minutes ?Pressure:  low ?Temperature:  38 degrees ? ?[facility] Adult PT Treatment:                                                DATE: 03/15/2022 ?Therapeutic Exercise: ?UBE level 2 x 4 min fwd for warm up while taking subjective ?cross body stretch - 2x30" ?biceps curl - 3# - 3x10 ?UE ranger - flexion - 30x ea - 33'' ?Standing row GTB - 3x10 ?Shoulder ext GTB - 2x10 ?Standing IR/ER YTB 2x10 Rt ?Farmers carry - 3# - 180' ?Pulley 2' flexion and scaption ?Wall slide flex x 20 R ?Modalities: ?Vasopneumatic (Game Ready)   ?Location: right shoulder ?Time:  10 minutes ?Pressure:  low ?Temperature:  38 degrees ? ?HOME EXERCISE  PROGRAM: ?Access Code: [email or web address] ?URL: [email or web address] ?Date: 01/15/2022 ?Prepared by: Shearon Balo ? ?Exercises ?Forearm Strengthening with Ball Squeeze - 3 x daily - 7 x weekly - 3 sets - 10 reps

## 2022-03-24 ENCOUNTER — Other Ambulatory Visit: Payer: Self-pay

## 2022-03-24 ENCOUNTER — Ambulatory Visit (INDEPENDENT_AMBULATORY_CARE_PROVIDER_SITE_OTHER): Payer: Self-pay | Admitting: Physician Assistant

## 2022-03-24 ENCOUNTER — Encounter: Payer: Self-pay | Admitting: Orthopaedic Surgery

## 2022-03-24 VITALS — Wt 215.8 lb

## 2022-03-24 DIAGNOSIS — Z9889 Other specified postprocedural states: Secondary | ICD-10-CM

## 2022-03-24 DIAGNOSIS — M67813 Other specified disorders of tendon, right shoulder: Secondary | ICD-10-CM

## 2022-03-24 DIAGNOSIS — G8929 Other chronic pain: Secondary | ICD-10-CM

## 2022-03-24 MED ORDER — ACETAMINOPHEN-CODEINE #3 300-30 MG PO TABS: 1.0000 | ORAL_TABLET | Freq: Two times a day (BID) | ORAL | 0 refills | Status: DC | PRN

## 2022-03-24 NOTE — Progress Notes (Signed)
? ?Post-Op Visit Note ?  ?Patient: Meagan Flores           ?Date of Birth: 1965-11-30           ?MRN: [id number] ?Visit Date: 03/24/2022 ?PCP: Elsie Stain, MD ? ? ?Assessment & Plan: ? ?Chief Complaint:  ?Chief Complaint  ?Patient presents with  ? Right Shoulder - Pain, Follow-up  ? ?Visit Diagnoses:  ?1. Biceps tendonosis of right shoulder   ? ? ?Plan: Patient is a pleasant 56 year old female who comes in today approximately 12 weeks status post right shoulder biceps tenodesis 12/16/2021.  She continues to endorse pain to the right shoulder but notes that this has gradually improved with time.  She is in physical therapy twice a week making slow but steady progress.  Examination of her right shoulder reveals forward flexion to approximately 120 degrees, external rotation to 45 degrees and internal rotation to near her back pocket.  I can passively forward flex her to about 150 degrees.  She is neurovascular intact distally.  At this point, I believe the patient may be getting some adhesive capsulitis.  I would like to refer her to Dr. Ernestina Patches for intra-articular cortisone injection to help with pain and to facilitate progression with physical therapy.  She agrees to this plan.  I have also called in Tylenol 3 to take as needed.  Follow-up with [place] in 6 weeks time for recheck.  Call with concerns or questions. ? ?Follow-Up Instructions: Return in about 6 weeks (around 05/05/2022).  ? ?Orders:  ?No orders of the defined types were placed in this encounter. ? ?Meds ordered this encounter  ?Medications  ? acetaminophen-codeine (TYLENOL #3) 300-30 MG tablet  ?  Sig: Take 1 tablet by mouth 2 (two) times daily as needed for moderate pain.  ?  Dispense:  30 tablet  ?  Refill:  0  ? ? ?Imaging: ?No new imaging ? ?PMFS History: ?Patient Active Problem List  ? Diagnosis Date Noted  ? Biceps tendonosis of right shoulder 02/10/2022  ? S/P arthroscopy of right shoulder 02/10/2022  ? Skin rash 02/17/2021  ? Preop cardiovascular  exam 11/25/2020  ? Pulmonary hypertension, unspecified ([place]) 11/25/2020  ? Prediabetes   ? Hyperlipidemia   ? 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) tear of right shoulder 09/30/2020  ? Rotator cuff impingement syndrome of right shoulder 09/30/2020  ? 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 Tendinopathy of right biceps tendon 09/30/2020  ? 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  ? Aneurysm ([place]) 03/18/2020  ? History of TIA (transient ischemic attack) 02/14/2020  ? History of stroke   ? Mixed hyperlipidemia   ? Essential hypertension   ? Acute pain of left shoulder 09/08/2017  ? Impingement syndrome of right shoulder 12/24/2016  ? Tobacco use 09/28/2016  ? Glenohumeral arthritis 12/27/2014  ? 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 ?Past Medical History:  ?Diagnosis Date  ? Acute pain of left shoulder 09/08/2017  ? Aneurysm ([place]) 03/18/2020  ? 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 COVID-19 virus infection 02/14/2020  ? 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) tear of right shoulder 09/30/2020  ? Essential hypertension   ? Glenohumeral arthritis 12/27/2014  ? History of stroke   ? History of TIA (transient ischemic attack) 02/14/2020  ? Hyperlipidemia   ? Hypothyroidism   ? Impingement syndrome of right shoulder 12/24/2016  ? Mixed hyperlipidemia   ? 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 Prediabetes   ? Preop cardiovascular exam 11/25/2020  ? Pulmonary hypertension,  unspecified ([place]) 11/25/2020  ? 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  ? Rotator cuff impingement syndrome of right shoulder 09/30/2020  ? Skin rash 02/17/2021  ? Stroke [facility])   ? Tendinopathy of right biceps tendon 09/30/2020  ? Thyroid disease   ? TIA (transient ischemic attack) 04/15/2018  ? Tobacco use 09/28/2016  ?  ?Family History  ?Problem Relation Age of Onset  ? Stomach cancer Sister   ? Colon polyps Sister   ? Esophageal cancer Neg Hx   ? Rectal cancer Neg Hx   ? Colon cancer Neg Hx   ? Breast  cancer Neg Hx   ?  ?Past Surgical History:  ?Procedure Laterality Date  ? BICEPT TENODESIS Right 12/16/2021  ? Procedure: RIGHT BICEPS TENODESIS;  Surgeon: Leandrew Koyanagi, MD;  Location: [place];  Service: Orthopedics;  Laterality: Right;  ? PARTIAL HYSTERECTOMY    ? right arm sx    ? SHOULDER ARTHROSCOPY WITH BICEPSTENOTOMY Right 01/21/2021  ? Procedure: SHOULDER ARTHROSCOPY WITH BICEPSTENOTOMY;  Surgeon: Leandrew Koyanagi, MD;  Location: [place];  Service: Orthopedics;  Laterality: Right;  ? SHOULDER ARTHROSCOPY WITH DISTAL CLAVICLE RESECTION Right 01/12/2017  ? Procedure: RIGHT SHOULDER ARTHROSCOPY WITH  SUBACROMIAL DECOMPRESSION, DISTAL CLAVICLE EXCISION, extensive debridement;  Surgeon: Leandrew Koyanagi, MD;  Location: [place];  Service: Orthopedics;  Laterality: Right;  ? THYMECTOMY  2004  ? THYROIDECTOMY    ? ?Social History  ? ?Occupational History  ? Not on file  ?Tobacco Use  ? Smoking status: Every Day  ?  Packs/day: 0.50  ?  Years: 34.00  ?  Pack years: 17.00  ?  Types: Cigarettes  ? Smokeless tobacco: Never  ?Vaping Use  ? Vaping Use: Never used  ?Substance and Sexual Activity  ? Alcohol use: Yes  ?  Comment: occassional  ? Drug use: No  ? Sexual activity: Not on file  ? ? ? ?

## 2022-03-29 ENCOUNTER — Ambulatory Visit: Payer: Self-pay | Attending: Physician Assistant

## 2022-03-29 DIAGNOSIS — G8929 Other chronic pain: Secondary | ICD-10-CM | POA: Insufficient documentation

## 2022-03-29 DIAGNOSIS — M25611 Stiffness of right shoulder, not elsewhere classified: Secondary | ICD-10-CM | POA: Insufficient documentation

## 2022-03-29 DIAGNOSIS — M6281 Muscle weakness (generalized): Secondary | ICD-10-CM | POA: Insufficient documentation

## 2022-03-29 DIAGNOSIS — R600 Localized edema: Secondary | ICD-10-CM | POA: Insufficient documentation

## 2022-03-29 DIAGNOSIS — M25511 Pain in right shoulder: Secondary | ICD-10-CM | POA: Insufficient documentation

## 2022-03-29 NOTE — Therapy (Signed)
?OUTPATIENT PHYSICAL THERAPY TREATMENT NOTE ? ? ?Patient Name: Meagan Flores ?MRN: [id number] ?DOB:Apr 04, 1966, 56 y.o., female ?Today's Date: 03/29/2022 ? ?PCP: Elsie Stain, MD ?REFERRING PROVIDER: Aundra Dubin, PA-C ? ? PT End of Session - 03/29/22 0747   ? ? Visit Number 21   ? Number of Visits 30   ? Date for PT Re-Evaluation 04/28/22   ? Authorization Type self pay   ? PT Start Time 0750   ? PT Stop Time 0830   ? PT Time Calculation (min) 40 min   ? Activity Tolerance Patient tolerated treatment well;Patient limited by pain   ? Behavior During Therapy [facility] for tasks assessed/performed   ? ?  ?  ? ?  ? ? ? ? ? ? ? ?  ? ? ?Past Medical History:  ?Diagnosis Date  ? Acute pain of left shoulder 09/08/2017  ? Aneurysm ([place]) 03/18/2020  ? 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 COVID-19 virus infection 02/14/2020  ? 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) tear of right shoulder 09/30/2020  ? Essential hypertension   ? Glenohumeral arthritis 12/27/2014  ? History of stroke   ? History of TIA (transient ischemic attack) 02/14/2020  ? Hyperlipidemia   ? Hypothyroidism   ? Impingement syndrome of right shoulder 12/24/2016  ? Mixed hyperlipidemia   ? 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 Prediabetes   ? Preop cardiovascular exam 11/25/2020  ? Pulmonary hypertension, unspecified ([place]) 11/25/2020  ? 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  ? Rotator cuff impingement syndrome of right shoulder 09/30/2020  ? Skin rash 02/17/2021  ? Stroke [facility])   ? Tendinopathy of right biceps tendon 09/30/2020  ? Thyroid disease   ? TIA (transient ischemic attack) 04/15/2018  ? Tobacco use 09/28/2016  ? ?Past Surgical History:  ?Procedure Laterality Date  ? BICEPT TENODESIS Right 12/16/2021  ? Procedure: RIGHT BICEPS TENODESIS;  Surgeon: Leandrew Koyanagi, MD;  Location: [place];  Service: Orthopedics;  Laterality: Right;  ? PARTIAL HYSTERECTOMY    ? right arm sx    ? SHOULDER ARTHROSCOPY WITH  BICEPSTENOTOMY Right 01/21/2021  ? Procedure: SHOULDER ARTHROSCOPY WITH BICEPSTENOTOMY;  Surgeon: Leandrew Koyanagi, MD;  Location: [place];  Service: Orthopedics;  Laterality: Right;  ? SHOULDER ARTHROSCOPY WITH DISTAL CLAVICLE RESECTION Right 01/12/2017  ? Procedure: RIGHT SHOULDER ARTHROSCOPY WITH  SUBACROMIAL DECOMPRESSION, DISTAL CLAVICLE EXCISION, extensive debridement;  Surgeon: Leandrew Koyanagi, MD;  Location: [place];  Service: Orthopedics;  Laterality: Right;  ? THYMECTOMY  2004  ? THYROIDECTOMY    ? ?Patient Active Problem List  ? Diagnosis Date Noted  ? Biceps tendonosis of right shoulder 02/10/2022  ? S/P arthroscopy of right shoulder 02/10/2022  ? Skin rash 02/17/2021  ? Preop cardiovascular exam 11/25/2020  ? Pulmonary hypertension, unspecified ([place]) 11/25/2020  ? Prediabetes   ? Hyperlipidemia   ? 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) tear of right shoulder 09/30/2020  ? Rotator cuff impingement syndrome of right shoulder 09/30/2020  ? 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 Tendinopathy of right biceps tendon 09/30/2020  ? 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  ? Aneurysm ([place]) 03/18/2020  ? History of TIA (transient ischemic attack) 02/14/2020  ? History of stroke   ? Mixed hyperlipidemia   ? Essential hypertension   ? Acute pain of left shoulder 09/08/2017  ? Impingement syndrome of right shoulder 12/24/2016  ? Tobacco use 09/28/2016  ? Glenohumeral arthritis 12/27/2014  ? 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 ? ?REFERRING  DIAG: Referral diagnosis: Biceps tendonosis of right shoulder [M67.813] ? ?THERAPY DIAG:  ?Chronic right shoulder pain ? ?Stiffness of right shoulder, not elsewhere classified ? ?Muscle weakness (generalized) ? ?Localized edema ? ?PERTINENT HISTORY: Hx of debridement and sub acromial decompression x2 (2018 and 2022). Hx of stroke, prediabetes ? ?PRECAUTIONS/RESTRICTIONS:  ? ?PRECAUTIONS: Shoulder biceps tenodesis  1/18 ?  ?WEIGHT BEARING RESTRICTIONS Yes see precautions ? ?SUBJECTIVE:  ?Continued R shoulder pain, 6-7/10, pain with extension and OH motions, pain with sleep posiitons ? ?Pain:  ?Are you having pain? Yes ?Pain location: Rt shoulder ?NPRS scale: current 6/10  ?Aggravating factors: shoulder movement ?Relieving factors: rest, ice ?Pain description: intermittent, sharp, and aching ?Severity: high ?Irritability: low ?Stage: Chronic ? ?OBJECTIVE: ? ?UPPER EXTREMITY AROM: ?  ?ROM Right ?01/06/2022 Left ?01/06/2022 Right ?01/28/2022 Right ?02/25/2022  ?Shoulder flexion   140 52p! 80p!  ?Shoulder abduction        ?Shoulder internal rotation        ?Shoulder external rotation        ?Functional IR        ?Functional ER        ?Shoulder extension        ?Elbow extension        ?Elbow flexion        ?  ?(Blank rows = not tested, N = WNL, * = concordant pain with testing) ?  ?UPPER EXTREMITY MMT: ?  ?MMT Right ?01/06/2022 Left ?01/06/2022  ?Shoulder flexion      ?Shoulder abduction (C5)      ?Shoulder ER      ?Shoulder IR      ?Middle trapezius      ?Lower trapezius      ?Shoulder extension      ?Grip strength      ?Cervical flexion (C1,C2)      ?Cervical S/B (C3)      ?Shoulder shrug (C4)      ?Elbow flexion (C6)      ?Elbow ext (C7)      ?Thumb ext (C8)      ?Finger abd (T1)      ?Grossly      ?  ?(Blank rows = not tested, score listed is out of 5 possible points.  N = WNL, D = diminished, C = clear for gross weakness with myotome testing, * = concordant pain with testing) ?  ?           ?  ?UPPER EXTREMITY PROM: ?  ?PROM Right ?01/06/2022 Left ?01/06/2022 Right ?01/28/2022 Right ?02/09/2022 Right ?02/11/2022  ?Shoulder flexion 95*   95p! At end range 85p! 97p!  ?Shoulder abduction         ?Shoulder internal rotation         ?Shoulder external rotation >40 degrees*       ?Functional IR         ?Functional ER         ?Shoulder extension         ?Elbow extension Full Full     ?Elbow flexion         ?  ?(Blank rows = not tested, N = WNL, * =  concordant pain with testing) ? ? ?ASTERISK SIGNS ? ? ?Asterisk Signs 2/17 2/17 2/28 3/10 3/14 3/16 3/21 3/30 4/5  ?Shoulder flexion PROM 100 100 (held d/t pain) 110 w/ pain  85 97     ?Shoulder flexion AROM     62 60  80p! 90 P!  ?Shoulder  AAROM    90   95 98p!   ? ? ?TREATMENT  ?[facility] Adult PT Treatment:                                                DATE: 03/29/22 ?Therapeutic Exercise: ?UBE level 2 3/3 min ?Prone row R 3x10 2# ?Prone ext 3x10 R 2# ?S/L ER 2x10 R ?biceps curl - 3# - 3x10 (reluctant to extend R elbow) ?R shoulder IR 3x10 RTB ?Farmers carry - 3# - 200' ?Wall slide flex 3x10 R( with ball, 110d flexion observed) ?Manual Therapy: ?Brief assessment of ROM with full IR/ER, pain limits flexion and abduction to 90d ? ?[facility] Adult PT Treatment:                                                DATE: 03/22/2022 ?Therapeutic Exercise: ?UBE level 2 x 4 min while taking subjective ?cross body stretch - 2x30" ?Prone row R 3x10 2# ?Prone ext 2x10 R 2# ?S/L ER 2x10 R ?biceps curl - 3# - 3x10 ?R shoulder IR 2x10 RTB ?Farmers carry - 3# - 180' ?Pulley 2' flexion and scaption ?Wall slide flex x 20 R ?Modalities: ?Vasopneumatic (Game Ready)   ?Location: right shoulder ?Time:  10 minutes ?Pressure:  low ?Temperature:  38 degrees ? ?[facility] Adult PT Treatment:                                                DATE: 03/17/2022 ?Therapeutic Exercise: ?UBE level 2 x 4 min while taking subjective ?cross body stretch - 2x30" ?Prone row R 2x10  ?Prone ext 2x10 R 2# ?S/L ER 3x10 R ?biceps curl - 3# - 3x10 ?UE ranger - flexion - 30x ea - 33'' ?Farmers carry - 3# - 180' ?Pulley 2' flexion and scaption ?Wall slide flex x 20 R ?Modalities: ?Vasopneumatic (Game Ready)   ?Location: right shoulder ?Time:  10 minutes ?Pressure:  low ?Temperature:  38 degrees ? ?[facility] Adult PT Treatment:                                                DATE: 03/15/2022 ?Therapeutic Exercise: ?UBE level 2 x 4 min fwd for warm up while taking subjective ?cross body  stretch - 2x30" ?biceps curl - 3# - 3x10 ?UE ranger - flexion - 30x ea - 33'' ?Standing row GTB - 3x10 ?Shoulder ext GTB - 2x10 ?Standing IR/ER YTB 2x10 Rt ?Farmers carry - 3# - 180' ?Pulley 2' flexion and scaption ?Wall slide

## 2022-03-31 ENCOUNTER — Telehealth: Payer: Self-pay

## 2022-03-31 ENCOUNTER — Ambulatory Visit: Payer: Self-pay

## 2022-04-05 ENCOUNTER — Ambulatory Visit: Payer: Self-pay

## 2022-04-05 DIAGNOSIS — G8929 Other chronic pain: Secondary | ICD-10-CM

## 2022-04-05 DIAGNOSIS — M6281 Muscle weakness (generalized): Secondary | ICD-10-CM

## 2022-04-05 DIAGNOSIS — M25611 Stiffness of right shoulder, not elsewhere classified: Secondary | ICD-10-CM

## 2022-04-05 NOTE — Therapy (Signed)
?OUTPATIENT PHYSICAL THERAPY TREATMENT NOTE ? ? ?Patient Name: Meagan Flores ?MRN: [id number] ?DOB:1966-11-05, 56 y.o., female ?Today's Date: 04/05/2022 ? ?PCP: Elsie Stain, MD ?REFERRING PROVIDER: Aundra Dubin, PA-C ? ? PT End of Session - 04/05/22 0745   ? ? Visit Number 22   ? Number of Visits 30   ? Date for PT Re-Evaluation 04/28/22   ? Authorization Type self pay   ? PT Start Time 0745   ? PT Stop Time 4193   ? PT Time Calculation (min) 39 min   ? Activity Tolerance Patient tolerated treatment well;Patient limited by pain   ? Behavior During Therapy [facility] for tasks assessed/performed   ? ?  ?  ? ?  ? ? ? ? ? ? ? ? ?  ? ? ?Past Medical History:  ?Diagnosis Date  ? Acute pain of left shoulder 09/08/2017  ? Aneurysm ([place]) 03/18/2020  ? 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 COVID-19 virus infection 02/14/2020  ? 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) tear of right shoulder 09/30/2020  ? Essential hypertension   ? Glenohumeral arthritis 12/27/2014  ? History of stroke   ? History of TIA (transient ischemic attack) 02/14/2020  ? Hyperlipidemia   ? Hypothyroidism   ? Impingement syndrome of right shoulder 12/24/2016  ? Mixed hyperlipidemia   ? 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 Prediabetes   ? Preop cardiovascular exam 11/25/2020  ? Pulmonary hypertension, unspecified ([place]) 11/25/2020  ? 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  ? Rotator cuff impingement syndrome of right shoulder 09/30/2020  ? Skin rash 02/17/2021  ? Stroke [facility])   ? Tendinopathy of right biceps tendon 09/30/2020  ? Thyroid disease   ? TIA (transient ischemic attack) 04/15/2018  ? Tobacco use 09/28/2016  ? ?Past Surgical History:  ?Procedure Laterality Date  ? BICEPT TENODESIS Right 12/16/2021  ? Procedure: RIGHT BICEPS TENODESIS;  Surgeon: Leandrew Koyanagi, MD;  Location: [place];  Service: Orthopedics;  Laterality: Right;  ? PARTIAL HYSTERECTOMY    ? right arm sx    ? SHOULDER ARTHROSCOPY WITH  BICEPSTENOTOMY Right 01/21/2021  ? Procedure: SHOULDER ARTHROSCOPY WITH BICEPSTENOTOMY;  Surgeon: Leandrew Koyanagi, MD;  Location: [place];  Service: Orthopedics;  Laterality: Right;  ? SHOULDER ARTHROSCOPY WITH DISTAL CLAVICLE RESECTION Right 01/12/2017  ? Procedure: RIGHT SHOULDER ARTHROSCOPY WITH  SUBACROMIAL DECOMPRESSION, DISTAL CLAVICLE EXCISION, extensive debridement;  Surgeon: Leandrew Koyanagi, MD;  Location: [place];  Service: Orthopedics;  Laterality: Right;  ? THYMECTOMY  2004  ? THYROIDECTOMY    ? ?Patient Active Problem List  ? Diagnosis Date Noted  ? Biceps tendonosis of right shoulder 02/10/2022  ? S/P arthroscopy of right shoulder 02/10/2022  ? Skin rash 02/17/2021  ? Preop cardiovascular exam 11/25/2020  ? Pulmonary hypertension, unspecified ([place]) 11/25/2020  ? Prediabetes   ? Hyperlipidemia   ? 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) tear of right shoulder 09/30/2020  ? Rotator cuff impingement syndrome of right shoulder 09/30/2020  ? 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 Tendinopathy of right biceps tendon 09/30/2020  ? 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  ? Aneurysm ([place]) 03/18/2020  ? History of TIA (transient ischemic attack) 02/14/2020  ? History of stroke   ? Mixed hyperlipidemia   ? Essential hypertension   ? Acute pain of left shoulder 09/08/2017  ? Impingement syndrome of right shoulder 12/24/2016  ? Tobacco use 09/28/2016  ? Glenohumeral arthritis 12/27/2014  ? 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 ? ?  REFERRING DIAG: Referral diagnosis: Biceps tendonosis of right shoulder [M67.813] ? ?THERAPY DIAG:  ?Chronic right shoulder pain ? ?Stiffness of right shoulder, not elsewhere classified ? ?Muscle weakness (generalized) ? ?Localized edema ? ?PERTINENT HISTORY: Hx of debridement and sub acromial decompression x2 (2018 and 2022). Hx of stroke, prediabetes ? ?PRECAUTIONS/RESTRICTIONS:  ? ?PRECAUTIONS: Shoulder biceps tenodesis  1/18 ?  ?WEIGHT BEARING RESTRICTIONS Yes see precautions ? ?SUBJECTIVE:  ?Pt presents to PT with continued R shoulder pain. She has continued compliance with HEP with no adverse effect. Pt is ready to begin PT at this time.  ? ?Pain:  ?Are you having pain? Yes ?Pain location: Rt shoulder ?NPRS scale: current 6/10  ?Aggravating factors: shoulder movement ?Relieving factors: rest, ice ?Pain description: intermittent, sharp, and aching ?Severity: high ?Irritability: low ?Stage: Chronic ? ?OBJECTIVE: ? ?UPPER EXTREMITY AROM: ?  ?ROM Right ?01/06/2022 Left ?01/06/2022 Right ?01/28/2022 Right ?02/25/2022  ?Shoulder flexion   140 52p! 80p!  ?Shoulder abduction        ?Shoulder internal rotation        ?Shoulder external rotation        ?Functional IR        ?Functional ER        ?Shoulder extension        ?Elbow extension        ?Elbow flexion        ?  ?(Blank rows = not tested, N = WNL, * = concordant pain with testing) ?  ?UPPER EXTREMITY MMT: ?  ?MMT Right ?01/06/2022 Left ?01/06/2022  ?Shoulder flexion      ?Shoulder abduction (C5)      ?Shoulder ER      ?Shoulder IR      ?Middle trapezius      ?Lower trapezius      ?Shoulder extension      ?Grip strength      ?Cervical flexion (C1,C2)      ?Cervical S/B (C3)      ?Shoulder shrug (C4)      ?Elbow flexion (C6)      ?Elbow ext (C7)      ?Thumb ext (C8)      ?Finger abd (T1)      ?Grossly      ?  ?(Blank rows = not tested, score listed is out of 5 possible points.  N = WNL, D = diminished, C = clear for gross weakness with myotome testing, * = concordant pain with testing) ?  ?           ?  ?UPPER EXTREMITY PROM: ?  ?PROM Right ?01/06/2022 Left ?01/06/2022 Right ?01/28/2022 Right ?02/09/2022 Right ?02/11/2022  ?Shoulder flexion 95*   95p! At end range 85p! 97p!  ?Shoulder abduction         ?Shoulder internal rotation         ?Shoulder external rotation >40 degrees*       ?Functional IR         ?Functional ER         ?Shoulder extension         ?Elbow extension Full Full     ?Elbow flexion          ?  ?(Blank rows = not tested, N = WNL, * = concordant pain with testing) ? ? ?ASTERISK SIGNS ? ? ?Asterisk Signs 2/17 2/17 2/28 3/10 3/14 3/16 3/21 3/30 4/5  ?Shoulder flexion PROM 100 100 (held d/t pain) 110 w/ pain  85 97     ?  Shoulder flexion AROM     62 60  80p! 90 P!  ?Shoulder AAROM    90   95 98p!   ? ? ?TREATMENT  ?[facility] Adult PT Treatment:                                                DATE: 04/05/22 ?Therapeutic Exercise: ?UBE level 2 3/3 min ?Pulley flex/scap x 2 min each ?S/L shoulder abd 2x10 R ?Prone ext 3x10 R 2# ?S/L ER 3x10 R ?biceps curl - 3# - 3x10 (reluctant to extend R elbow) ?R shoulder IR 3x10 GTB ?Farmers carry - 4# - 39' ?Ball roll up incline on table x 20 R ? ?[place] Adult PT Treatment:                                                DATE: 03/29/22 ?Therapeutic Exercise: ?UBE level 2 3/3 min ?Prone row R 3x10 2# ?Prone ext 3x10 R 2# ?S/L ER 2x10 R ?biceps curl - 3# - 3x10 (reluctant to extend R elbow) ?R shoulder IR 3x10 RTB ?Farmers carry - 3# - 200' ?Wall slide flex 3x10 R( with ball, 110d flexion observed) ?Manual Therapy: ?Brief assessment of ROM with full IR/ER, pain limits flexion and abduction to 90d ? ?[facility] Adult PT Treatment:                                                DATE: 03/22/2022 ?Therapeutic Exercise: ?UBE level 2 x 4 min while taking subjective ?cross body stretch - 2x30" ?Prone row R 3x10 2# ?Prone ext 2x10 R 2# ?S/L ER 2x10 R ?biceps curl - 3# - 3x10 ?R shoulder IR 2x10 RTB ?Farmers carry - 3# - 180' ?Pulley 2' flexion and scaption ?Wall slide flex x 20 R ?Modalities: ?Vasopneumatic (Game Ready)   ?Location: right shoulder ?Time:  10 minutes ?Pressure:  low ?Temperature:  38 degrees ? ?[facility] Adult PT Treatment:                                                DATE: 03/17/2022 ?Therapeutic Exercise: ?UBE level 2 x 4 min while taking subjective ?cross body stretch - 2x30" ?Prone row R 2x10  ?Prone ext 2x10 R 2# ?S/L ER 3x10 R ?biceps curl - 3# - 3x10 ?UE ranger - flexion -  30x ea - 33'' ?Farmers carry - 3# - 180' ?Pulley 2' flexion and scaption ?Wall slide flex x 20 R ?Modalities: ?Vasopneumatic (Game Ready)   ?Location: right shoulder ?Time:  10 minutes ?Pressure:  low ?Temperature:  3

## 2022-04-08 ENCOUNTER — Ambulatory Visit: Payer: Self-pay

## 2022-04-08 DIAGNOSIS — G8929 Other chronic pain: Secondary | ICD-10-CM

## 2022-04-08 DIAGNOSIS — M25611 Stiffness of right shoulder, not elsewhere classified: Secondary | ICD-10-CM

## 2022-04-08 NOTE — Therapy (Signed)
?OUTPATIENT PHYSICAL THERAPY TREATMENT NOTE ? ? ?Patient Name: Meagan Flores ?MRN: [id number] ?DOB:1966-03-09, 56 y.o., female ?Today's Date: 04/08/2022 ? ?PCP: Elsie Stain, MD ?REFERRING PROVIDER: Aundra Dubin, PA-C ? ? PT End of Session - 04/08/22 0910   ? ? Visit Number 23   ? Number of Visits 30   ? Date for PT Re-Evaluation 04/28/22   ? Authorization Type self pay   ? PT Start Time 7408   ? PT Stop Time 0955   ? PT Time Calculation (min) 40 min   ? Activity Tolerance Patient tolerated treatment well;Patient limited by pain   ? Behavior During Therapy [facility] for tasks assessed/performed   ? ?  ?  ? ?  ? ? ? ? ? ? ? ? ? ?  ? ? ?Past Medical History:  ?Diagnosis Date  ? Acute pain of left shoulder 09/08/2017  ? Aneurysm ([place]) 03/18/2020  ? 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 COVID-19 virus infection 02/14/2020  ? 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) tear of right shoulder 09/30/2020  ? Essential hypertension   ? Glenohumeral arthritis 12/27/2014  ? History of stroke   ? History of TIA (transient ischemic attack) 02/14/2020  ? Hyperlipidemia   ? Hypothyroidism   ? Impingement syndrome of right shoulder 12/24/2016  ? Mixed hyperlipidemia   ? 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 Prediabetes   ? Preop cardiovascular exam 11/25/2020  ? Pulmonary hypertension, unspecified ([place]) 11/25/2020  ? 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  ? Rotator cuff impingement syndrome of right shoulder 09/30/2020  ? Skin rash 02/17/2021  ? Stroke [facility])   ? Tendinopathy of right biceps tendon 09/30/2020  ? Thyroid disease   ? TIA (transient ischemic attack) 04/15/2018  ? Tobacco use 09/28/2016  ? ?Past Surgical History:  ?Procedure Laterality Date  ? BICEPT TENODESIS Right 12/16/2021  ? Procedure: RIGHT BICEPS TENODESIS;  Surgeon: Leandrew Koyanagi, MD;  Location: [place];  Service: Orthopedics;  Laterality: Right;  ? PARTIAL HYSTERECTOMY    ? right arm sx    ? SHOULDER ARTHROSCOPY WITH  BICEPSTENOTOMY Right 01/21/2021  ? Procedure: SHOULDER ARTHROSCOPY WITH BICEPSTENOTOMY;  Surgeon: Leandrew Koyanagi, MD;  Location: [place];  Service: Orthopedics;  Laterality: Right;  ? SHOULDER ARTHROSCOPY WITH DISTAL CLAVICLE RESECTION Right 01/12/2017  ? Procedure: RIGHT SHOULDER ARTHROSCOPY WITH  SUBACROMIAL DECOMPRESSION, DISTAL CLAVICLE EXCISION, extensive debridement;  Surgeon: Leandrew Koyanagi, MD;  Location: [place];  Service: Orthopedics;  Laterality: Right;  ? THYMECTOMY  2004  ? THYROIDECTOMY    ? ?Patient Active Problem List  ? Diagnosis Date Noted  ? Biceps tendonosis of right shoulder 02/10/2022  ? S/P arthroscopy of right shoulder 02/10/2022  ? Skin rash 02/17/2021  ? Preop cardiovascular exam 11/25/2020  ? Pulmonary hypertension, unspecified ([place]) 11/25/2020  ? Prediabetes   ? Hyperlipidemia   ? 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) tear of right shoulder 09/30/2020  ? Rotator cuff impingement syndrome of right shoulder 09/30/2020  ? 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 Tendinopathy of right biceps tendon 09/30/2020  ? 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  ? Aneurysm ([place]) 03/18/2020  ? History of TIA (transient ischemic attack) 02/14/2020  ? History of stroke   ? Mixed hyperlipidemia   ? Essential hypertension   ? Acute pain of left shoulder 09/08/2017  ? Impingement syndrome of right shoulder 12/24/2016  ? Tobacco use 09/28/2016  ? Glenohumeral arthritis 12/27/2014  ? 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 ? ?  REFERRING DIAG: Referral diagnosis: Biceps tendonosis of right shoulder [M67.813] ? ?THERAPY DIAG:  ?Chronic right shoulder pain ? ?Stiffness of right shoulder, not elsewhere classified ? ?Muscle weakness (generalized) ? ?Localized edema ? ?PERTINENT HISTORY: Hx of debridement and sub acromial decompression x2 (2018 and 2022). Hx of stroke, prediabetes ? ?PRECAUTIONS/RESTRICTIONS:  ? ?PRECAUTIONS: Shoulder biceps tenodesis  1/18 ?  ?WEIGHT BEARING RESTRICTIONS Yes see precautions ? ?SUBJECTIVE:  ?Pt presents to PT with reports of continued right shoulder pain and discomfort. She is hoping to get an injection soon to decrease pain. Pt is ready to begin PT at this time.  ? ?Pain:  ?Are you having pain? Yes ?Pain location: Rt shoulder ?NPRS scale: current 6/10  ?Aggravating factors: shoulder movement ?Relieving factors: rest, ice ?Pain description: intermittent, sharp, and aching ?Severity: high ?Irritability: low ?Stage: Chronic ? ?OBJECTIVE: ? ?UPPER EXTREMITY AROM: ?  ?ROM Right ?01/06/2022 Left ?01/06/2022 Right ?01/28/2022 Right ?02/25/2022  ?Shoulder flexion   140 52p! 80p!  ?Shoulder abduction        ?Shoulder internal rotation        ?Shoulder external rotation        ?Functional IR        ?Functional ER        ?Shoulder extension        ?Elbow extension        ?Elbow flexion        ?  ?(Blank rows = not tested, N = WNL, * = concordant pain with testing) ?  ?UPPER EXTREMITY MMT: ?  ?MMT Right ?01/06/2022 Left ?01/06/2022  ?Shoulder flexion      ?Shoulder abduction (C5)      ?Shoulder ER      ?Shoulder IR      ?Middle trapezius      ?Lower trapezius      ?Shoulder extension      ?Grip strength      ?Cervical flexion (C1,C2)      ?Cervical S/B (C3)      ?Shoulder shrug (C4)      ?Elbow flexion (C6)      ?Elbow ext (C7)      ?Thumb ext (C8)      ?Finger abd (T1)      ?Grossly      ?  ?(Blank rows = not tested, score listed is out of 5 possible points.  N = WNL, D = diminished, C = clear for gross weakness with myotome testing, * = concordant pain with testing) ?  ?           ?  ?UPPER EXTREMITY PROM: ?  ?PROM Right ?01/06/2022 Left ?01/06/2022 Right ?01/28/2022 Right ?02/09/2022 Right ?02/11/2022  ?Shoulder flexion 95*   95p! At end range 85p! 97p!  ?Shoulder abduction         ?Shoulder internal rotation         ?Shoulder external rotation >40 degrees*       ?Functional IR         ?Functional ER         ?Shoulder extension         ?Elbow extension Full  Full     ?Elbow flexion         ?  ?(Blank rows = not tested, N = WNL, * = concordant pain with testing) ? ? ?ASTERISK SIGNS ? ? ?Asterisk Signs 2/17 2/17 2/28 3/10 3/14 3/16 3/21 3/30 4/5  ?Shoulder flexion PROM 100 100 (held d/t pain) 110 w/ pain  85  97     ?Shoulder flexion AROM     62 60  80p! 90 P!  ?Shoulder AAROM    90   95 98p!   ? ? ?TREATMENT  ?[facility] Adult PT Treatment:                                                DATE: 04/08/2022 ?Therapeutic Exercise: ?UBE level 2 3/3 min ?Pulley flex x 2 min each ?S/L shoulder abd 2x10 R ?Prone ext 3x10 R 2# ?S/L ER 3x10 R ?Supine cane flex 2x10  ?biceps curl - 3# - 3x10  ?R shoulder IR 3x10 GTB ?Farmers carry - 4# - 29' ?Ball roll up incline on table x 20 R ?Modalities: ?Vasopneumatic (Game Ready)   ?Location: right shoulder ?Time:  10 minutes ?Pressure:  low ?Temperature:  38 degrees ? ?[facility] Adult PT Treatment:                                                DATE: 04/05/2022 ?Therapeutic Exercise: ?UBE level 2 3/3 min ?Pulley flex/scap x 2 min each ?S/L shoulder abd 2x10 R ?Prone ext 3x10 R 2# ?S/L ER 3x10 R ?biceps curl - 3# - 3x10 (reluctant to extend R elbow) ?R shoulder IR 3x10 GTB ?Farmers carry - 4# - 81' ?Ball roll up incline on table x 20 R ? ?[place] Adult PT Treatment:                                                DATE: 03/29/2022 ?Therapeutic Exercise: ?UBE level 2 3/3 min ?Prone row R 3x10 2# ?Prone ext 3x10 R 2# ?S/L ER 2x10 R ?biceps curl - 3# - 3x10 (reluctant to extend R elbow) ?R shoulder IR 3x10 RTB ?Farmers carry - 3# - 200' ?Wall slide flex 3x10 R( with ball, 110d flexion observed) ?Manual Therapy: ?Brief assessment of ROM with full IR/ER, pain limits flexion and abduction to 90d ? ? ?HOME EXERCISE PROGRAM: ?Access Code: [email or web address] ?URL: [email or web address] ?Date: 01/15/2022 ?Prepared by: Shearon Balo ? ?Exercises ?Forearm Strengthening with Ball Squeeze - 3 x daily - 7 x weekly - 3 sets - 10 reps ?Seated Shoulder Flexion Towel Slide  at Table Top Full Range of Motion - 2 x daily - 7 x weekly - 3 sets - 10 reps ?Seated Elbow Flexion and Extension AROM - 1 x daily - 7 x weekly - 3 sets - 10 reps ?Seated Forearm Pronation and Supination AROM - 1

## 2022-04-12 ENCOUNTER — Ambulatory Visit: Payer: Self-pay

## 2022-04-12 NOTE — Therapy (Incomplete)
?OUTPATIENT PHYSICAL THERAPY TREATMENT NOTE ? ? ?Patient Name: Meagan Flores ?MRN: [id number] ?DOB:1966/05/07, 56 y.o., female ?Today's Date: 04/12/2022 ? ?PCP: Elsie Stain, MD ?REFERRING PROVIDER: Aundra Dubin, PA-C ? ? ? ? ? ? ? ? ? ? ? ?  ? ? ?Past Medical History:  ?Diagnosis Date  ? Acute pain of left shoulder 09/08/2017  ? Aneurysm ([place]) 03/18/2020  ? 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 COVID-19 virus infection 02/14/2020  ? 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) tear of right shoulder 09/30/2020  ? Essential hypertension   ? Glenohumeral arthritis 12/27/2014  ? History of stroke   ? History of TIA (transient ischemic attack) 02/14/2020  ? Hyperlipidemia   ? Hypothyroidism   ? Impingement syndrome of right shoulder 12/24/2016  ? Mixed hyperlipidemia   ? 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 Prediabetes   ? Preop cardiovascular exam 11/25/2020  ? Pulmonary hypertension, unspecified ([place]) 11/25/2020  ? 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  ? Rotator cuff impingement syndrome of right shoulder 09/30/2020  ? Skin rash 02/17/2021  ? Stroke [facility])   ? Tendinopathy of right biceps tendon 09/30/2020  ? Thyroid disease   ? TIA (transient ischemic attack) 04/15/2018  ? Tobacco use 09/28/2016  ? ?Past Surgical History:  ?Procedure Laterality Date  ? BICEPT TENODESIS Right 12/16/2021  ? Procedure: RIGHT BICEPS TENODESIS;  Surgeon: Leandrew Koyanagi, MD;  Location: [place];  Service: Orthopedics;  Laterality: Right;  ? PARTIAL HYSTERECTOMY    ? right arm sx    ? SHOULDER ARTHROSCOPY WITH BICEPSTENOTOMY Right 01/21/2021  ? Procedure: SHOULDER ARTHROSCOPY WITH BICEPSTENOTOMY;  Surgeon: Leandrew Koyanagi, MD;  Location: [place];  Service: Orthopedics;  Laterality: Right;  ? SHOULDER ARTHROSCOPY WITH DISTAL CLAVICLE RESECTION Right 01/12/2017  ? Procedure: RIGHT SHOULDER ARTHROSCOPY WITH  SUBACROMIAL DECOMPRESSION, DISTAL CLAVICLE EXCISION, extensive debridement;   Surgeon: Leandrew Koyanagi, MD;  Location: [place];  Service: Orthopedics;  Laterality: Right;  ? THYMECTOMY  2004  ? THYROIDECTOMY    ? ?Patient Active Problem List  ? Diagnosis Date Noted  ? Biceps tendonosis of right shoulder 02/10/2022  ? S/P arthroscopy of right shoulder 02/10/2022  ? Skin rash 02/17/2021  ? Preop cardiovascular exam 11/25/2020  ? Pulmonary hypertension, unspecified ([place]) 11/25/2020  ? Prediabetes   ? Hyperlipidemia   ? 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) tear of right shoulder 09/30/2020  ? Rotator cuff impingement syndrome of right shoulder 09/30/2020  ? 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 Tendinopathy of right biceps tendon 09/30/2020  ? 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  ? Aneurysm ([place]) 03/18/2020  ? History of TIA (transient ischemic attack) 02/14/2020  ? History of stroke   ? Mixed hyperlipidemia   ? Essential hypertension   ? Acute pain of left shoulder 09/08/2017  ? Impingement syndrome of right shoulder 12/24/2016  ? Tobacco use 09/28/2016  ? Glenohumeral arthritis 12/27/2014  ? 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 ? ?REFERRING DIAG: Referral diagnosis: Biceps tendonosis of right shoulder [M67.813] ? ?THERAPY DIAG:  ?Chronic right shoulder pain ? ?Stiffness of right shoulder, not elsewhere classified ? ?Muscle weakness (generalized) ? ?Localized edema ? ?PERTINENT HISTORY: Hx of debridement and sub acromial decompression x2 (2018 and 2022). Hx of stroke, prediabetes ? ?PRECAUTIONS/RESTRICTIONS:  ? ?PRECAUTIONS: Shoulder biceps tenodesis 1/18 ?  ?WEIGHT BEARING RESTRICTIONS Yes see precautions ? ?SUBJECTIVE:  ?***  ? ?Pain:  ?Are you having pain? Yes ?Pain location: Rt shoulder ?NPRS scale: current 6/10  ?  Aggravating factors: shoulder movement ?Relieving factors: rest, ice ?Pain description: intermittent, sharp, and aching ?Severity: high ?Irritability: low ?Stage: Chronic ? ?OBJECTIVE: ? ?UPPER EXTREMITY AROM: ?  ?ROM  Right ?01/06/2022 Left ?01/06/2022 Right ?01/28/2022 Right ?02/25/2022  ?Shoulder flexion   140 52p! 80p!  ?Shoulder abduction        ?Shoulder internal rotation        ?Shoulder external rotation        ?Functional IR        ?Functional ER        ?Shoulder extension        ?Elbow extension        ?Elbow flexion        ?  ?(Blank rows = not tested, N = WNL, * = concordant pain with testing) ?  ?UPPER EXTREMITY MMT: ?  ?MMT Right ?01/06/2022 Left ?01/06/2022  ?Shoulder flexion      ?Shoulder abduction (C5)      ?Shoulder ER      ?Shoulder IR      ?Middle trapezius      ?Lower trapezius      ?Shoulder extension      ?Grip strength      ?Cervical flexion (C1,C2)      ?Cervical S/B (C3)      ?Shoulder shrug (C4)      ?Elbow flexion (C6)      ?Elbow ext (C7)      ?Thumb ext (C8)      ?Finger abd (T1)      ?Grossly      ?  ?(Blank rows = not tested, score listed is out of 5 possible points.  N = WNL, D = diminished, C = clear for gross weakness with myotome testing, * = concordant pain with testing) ?  ?           ?  ?UPPER EXTREMITY PROM: ?  ?PROM Right ?01/06/2022 Left ?01/06/2022 Right ?01/28/2022 Right ?02/09/2022 Right ?02/11/2022  ?Shoulder flexion 95*   95p! At end range 85p! 97p!  ?Shoulder abduction         ?Shoulder internal rotation         ?Shoulder external rotation >40 degrees*       ?Functional IR         ?Functional ER         ?Shoulder extension         ?Elbow extension Full Full     ?Elbow flexion         ?  ?(Blank rows = not tested, N = WNL, * = concordant pain with testing) ? ? ?ASTERISK SIGNS ? ? ?Asterisk Signs 2/17 2/17 2/28 3/10 3/14 3/16 3/21 3/30 4/5  ?Shoulder flexion PROM 100 100 (held d/t pain) 110 w/ pain  85 97     ?Shoulder flexion AROM     62 60  80p! 90 P!  ?Shoulder AAROM    90   95 98p!   ? ? ?TREATMENT  ?[facility] Adult PT Treatment:                                                DATE: 04/12/2022 ?Therapeutic Exercise: ?UBE level 2 3/3 min ?Pulley flex x 2 min each ?S/L shoulder abd 2x10 R ?Prone ext 3x10 R  2# ?S/L ER 3x10 R ?Supine cane flex 2x10  ?biceps curl -  3# - 3x10  ?R shoulder IR 3x10 GTB ?Farmers carry - 4# - 22' ?Ball roll up incline on table x 20 R ?Modalities: ?Vasopneumatic (Game Ready)   ?Location: right shoulder ?Time:  10 minutes ?Pressure:  low ?Temperature:  38 degrees ? ?[facility] Adult PT Treatment:                                                DATE: 04/08/2022 ?Therapeutic Exercise: ?UBE level 2 3/3 min ?Pulley flex x 2 min each ?S/L shoulder abd 2x10 R ?Prone ext 3x10 R 2# ?S/L ER 3x10 R ?Supine cane flex 2x10  ?biceps curl - 3# - 3x10  ?R shoulder IR 3x10 GTB ?Farmers carry - 4# - 82' ?Ball roll up incline on table x 20 R ?Modalities: ?Vasopneumatic (Game Ready)   ?Location: right shoulder ?Time:  10 minutes ?Pressure:  low ?Temperature:  38 degrees ? ?[facility] Adult PT Treatment:                                                DATE: 04/05/2022 ?Therapeutic Exercise: ?UBE level 2 3/3 min ?Pulley flex/scap x 2 min each ?S/L shoulder abd 2x10 R ?Prone ext 3x10 R 2# ?S/L ER 3x10 R ?biceps curl - 3# - 3x10 (reluctant to extend R elbow) ?R shoulder IR 3x10 GTB ?Farmers carry - 4# - 63' ?Ball roll up incline on table x 20 R ? ?[place] Adult PT Treatment:                                                DATE: 03/29/2022 ?Therapeutic Exercise: ?UBE level 2 3/3 min ?Prone row R 3x10 2# ?Prone ext 3x10 R 2# ?S/L ER 2x10 R ?biceps curl - 3# - 3x10 (reluctant to extend R elbow) ?R shoulder IR 3x10 RTB ?Farmers carry - 3# - 200' ?Wall slide flex 3x10 R( with ball, 110d flexion observed) ?Manual Therapy: ?Brief assessment of ROM with full IR/ER, pain limits flexion and abduction to 90d ? ? ?HOME EXERCISE PROGRAM: ?Access Code: [email or web address] ?URL: [email or web address] ?Date: 01/15/2022 ?Prepared by: Shearon Balo ? ?Exercises ?Forearm Strengthening with Ball Squeeze - 3 x daily - 7 x weekly - 3 sets - 10 reps ?Seated Shoulder Flexion Towel Slide at Table Top Full Range of Motion - 2 x daily - 7 x weekly - 3 sets  - 10 reps ?Seated Elbow Flexion and Extension AROM - 1 x daily - 7 x weekly - 3 sets - 10 reps ?Seated Forearm Pronation and Supination AROM - 1 x daily - 7 x weekly - 3 sets - 10 reps ? ?Added 01/28/2022: ?Shoulder

## 2022-04-14 ENCOUNTER — Ambulatory Visit: Payer: Self-pay | Admitting: Physical Therapy

## 2022-04-14 ENCOUNTER — Encounter: Payer: Self-pay | Admitting: Physical Therapy

## 2022-04-14 DIAGNOSIS — M25611 Stiffness of right shoulder, not elsewhere classified: Secondary | ICD-10-CM

## 2022-04-14 DIAGNOSIS — R600 Localized edema: Secondary | ICD-10-CM

## 2022-04-14 DIAGNOSIS — M6281 Muscle weakness (generalized): Secondary | ICD-10-CM

## 2022-04-14 DIAGNOSIS — G8929 Other chronic pain: Secondary | ICD-10-CM

## 2022-04-14 NOTE — Therapy (Signed)
?OUTPATIENT PHYSICAL THERAPY TREATMENT NOTE ? ? ?Patient Name: Meagan Flores ?MRN: [id number] ?DOB:[birth year], 56 y.o., female ?Today's Date: 04/14/2022 ? ?PCP: Elsie Stain, MD ?REFERRING PROVIDER: Aundra Dubin, PA-C ? ? PT End of Session - 04/14/22 0915   ? ? Visit Number [id number]   ? Number of Visits 30   ? Date for PT Re-Evaluation 04/28/22   ? Authorization Type self pay   ? Activity Tolerance Patient tolerated treatment well;Patient limited by pain   ? Behavior During Therapy [facility] for tasks assessed/performed   ? ?  ?  ? ?  ? ? ? ? ? ? ? ? ? ?  ? ? ?Past Medical History:  ?Diagnosis Date  ? Acute pain of left shoulder 09/08/2017  ? Aneurysm ([place]) 03/18/2020  ? 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 COVID-19 virus infection 02/14/2020  ? 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) tear of right shoulder 09/30/2020  ? Essential hypertension   ? Glenohumeral arthritis 12/27/2014  ? History of stroke   ? History of TIA (transient ischemic attack) 02/14/2020  ? Hyperlipidemia   ? Hypothyroidism   ? Impingement syndrome of right shoulder 12/24/2016  ? Mixed hyperlipidemia   ? 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 Prediabetes   ? Preop cardiovascular exam 11/25/2020  ? Pulmonary hypertension, unspecified ([place]) 11/25/2020  ? 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  ? Rotator cuff impingement syndrome of right shoulder 09/30/2020  ? Skin rash 02/17/2021  ? Stroke [facility])   ? Tendinopathy of right biceps tendon 09/30/2020  ? Thyroid disease   ? TIA (transient ischemic attack) 04/15/2018  ? Tobacco use 09/28/2016  ? ?Past Surgical History:  ?Procedure Laterality Date  ? BICEPT TENODESIS Right 12/16/2021  ? Procedure: RIGHT BICEPS TENODESIS;  Surgeon: Leandrew Koyanagi, MD;  Location: [place];  Service: Orthopedics;  Laterality: Right;  ? PARTIAL HYSTERECTOMY    ? right arm sx    ? SHOULDER ARTHROSCOPY WITH BICEPSTENOTOMY Right 01/21/2021  ? Procedure: SHOULDER ARTHROSCOPY WITH BICEPSTENOTOMY;   Surgeon: Leandrew Koyanagi, MD;  Location: [place];  Service: Orthopedics;  Laterality: Right;  ? SHOULDER ARTHROSCOPY WITH DISTAL CLAVICLE RESECTION Right 01/12/2017  ? Procedure: RIGHT SHOULDER ARTHROSCOPY WITH  SUBACROMIAL DECOMPRESSION, DISTAL CLAVICLE EXCISION, extensive debridement;  Surgeon: Leandrew Koyanagi, MD;  Location: [place];  Service: Orthopedics;  Laterality: Right;  ? THYMECTOMY  2004  ? THYROIDECTOMY    ? ?Patient Active Problem List  ? Diagnosis Date Noted  ? Biceps tendonosis of right shoulder 02/10/2022  ? S/P arthroscopy of right shoulder 02/10/2022  ? Skin rash 02/17/2021  ? Preop cardiovascular exam 11/25/2020  ? Pulmonary hypertension, unspecified ([place]) 11/25/2020  ? Prediabetes   ? Hyperlipidemia   ? 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) tear of right shoulder 09/30/2020  ? Rotator cuff impingement syndrome of right shoulder 09/30/2020  ? 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 Tendinopathy of right biceps tendon 09/30/2020  ? 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  ? Aneurysm ([place]) 03/18/2020  ? History of TIA (transient ischemic attack) 02/14/2020  ? History of stroke   ? Mixed hyperlipidemia   ? Essential hypertension   ? Acute pain of left shoulder 09/08/2017  ? Impingement syndrome of right shoulder 12/24/2016  ? Tobacco use 09/28/2016  ? Glenohumeral arthritis 12/27/2014  ? 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 ? ?REFERRING DIAG: Referral diagnosis: Biceps tendonosis of right shoulder [M67.813] ? ?THERAPY DIAG:  ?Chronic right shoulder pain ? ?Stiffness of right  shoulder, not elsewhere classified ? ?Muscle weakness (generalized) ? ?Localized edema ? ?PERTINENT HISTORY: Hx of debridement and sub acromial decompression x2 (2018 and 2022). Hx of stroke, prediabetes ? ?PRECAUTIONS/RESTRICTIONS:  ? ?PRECAUTIONS: Shoulder biceps tenodesis 1/18 ?  ?WEIGHT BEARING RESTRICTIONS Yes see precautions ? ?SUBJECTIVE:  ?Pt reports  continued shoulder pain with little improvement. ? ?Pain:  ?Are you having pain? Yes ?Pain location: Rt shoulder ?NPRS scale: current 7/10  ?Aggravating factors: shoulder movement ?Relieving factors: rest, ice ?Pain description: intermittent, sharp, and aching ?Severity: high ?Irritability: low ?Stage: Chronic ? ?OBJECTIVE: ? ?UPPER EXTREMITY AROM: ?  ?ROM Right ?01/06/2022 Left ?01/06/2022 Right ?01/28/2022 Right ?02/25/2022  ?Shoulder flexion   140 52p! 80p!  ?Shoulder abduction        ?Shoulder internal rotation        ?Shoulder external rotation        ?Functional IR        ?Functional ER        ?Shoulder extension        ?Elbow extension        ?Elbow flexion        ?  ?(Blank rows = not tested, N = WNL, * = concordant pain with testing) ?  ?UPPER EXTREMITY MMT: ?  ?MMT Right ?01/06/2022 Left ?01/06/2022  ?Shoulder flexion      ?Shoulder abduction (C5)      ?Shoulder ER      ?Shoulder IR      ?Middle trapezius      ?Lower trapezius      ?Shoulder extension      ?Grip strength      ?Cervical flexion (C1,C2)      ?Cervical S/B (C3)      ?Shoulder shrug (C4)      ?Elbow flexion (C6)      ?Elbow ext (C7)      ?Thumb ext (C8)      ?Finger abd (T1)      ?Grossly      ?  ?(Blank rows = not tested, score listed is out of 5 possible points.  N = WNL, D = diminished, C = clear for gross weakness with myotome testing, * = concordant pain with testing) ?  ?           ?  ?UPPER EXTREMITY PROM: ?  ?PROM Right ?01/06/2022 Left ?01/06/2022 Right ?01/28/2022 Right ?02/09/2022 Right ?02/11/2022  ?Shoulder flexion 95*   95p! At end range 85p! 97p!  ?Shoulder abduction         ?Shoulder internal rotation         ?Shoulder external rotation >40 degrees*       ?Functional IR         ?Functional ER         ?Shoulder extension         ?Elbow extension Full Full     ?Elbow flexion         ?  ?(Blank rows = not tested, N = WNL, * = concordant pain with testing) ? ? ?ASTERISK SIGNS ? ? ?Asterisk Signs 2/17 2/17 2/28 3/10 3/14 3/16 3/21 3/30 4/5  ?Shoulder  flexion PROM 100 100 (held d/t pain) 110 w/ pain  85 97     ?Shoulder flexion AROM     62 60  80p! 90 P!  ?Shoulder AAROM    90   95 98p!   ? ? ?TREATMENT  ? ?[facility] Adult PT Treatment:  DATE: 04/08/2022 ?Therapeutic Exercise: ?UBE level 2 3/3 min ?Wall slide - 20x ?Wall push up - 3x10 ?S/L shoulder abd 2x10 R ?S/L ER 3x10 R ?Supine AAROM flexion 2x10  ?biceps curl - 3# - 3x10  ?R shoulder IR 3x10 GTB ?Farmers carry - 5# - 4' ? ?Modalities: ?Vasopneumatic (Game Ready)   ?Location: right shoulder ?Time:  10 minutes ?Pressure:  low ?Temperature:  38 degrees ? ?[facility] Adult PT Treatment:                                                DATE: 04/08/2022 ?Therapeutic Exercise: ?UBE level 2 3/3 min ?Pulley flex x 2 min each ?S/L shoulder abd 2x10 R ?Prone ext 3x10 R 2# ?S/L ER 3x10 R ?Supine cane flex 2x10  ?biceps curl - 3# - 3x10  ?R shoulder IR 3x10 GTB ?Farmers carry - 4# - 90' ?Ball roll up incline on table x 20 R ?Modalities: ?Vasopneumatic (Game Ready)   ?Location: right shoulder ?Time:  10 minutes ?Pressure:  low ?Temperature:  38 degrees ? ?[facility] Adult PT Treatment:                                                DATE: 04/05/2022 ?Therapeutic Exercise: ?UBE level 2 3/3 min ?Pulley flex/scap x 2 min each ?S/L shoulder abd 2x10 R ?Prone ext 3x10 R 2# ?S/L ER 3x10 R ?biceps curl - 3# - 3x10 (reluctant to extend R elbow) ?R shoulder IR 3x10 GTB ?Farmers carry - 4# - 61' ?Ball roll up incline on table x 20 R ? ?[place] Adult PT Treatment:                                                DATE: 03/29/2022 ?Therapeutic Exercise: ?UBE level 2 3/3 min ?Prone row R 3x10 2# ?Prone ext 3x10 R 2# ?S/L ER 2x10 R ?biceps curl - 3# - 3x10 (reluctant to extend R elbow) ?R shoulder IR 3x10 RTB ?Farmers carry - 3# - 200' ?Wall slide flex 3x10 R( with ball, 110d flexion observed) ?Manual Therapy: ?Brief assessment of ROM with full IR/ER, pain limits flexion and abduction to 90d ? ? ?HOME EXERCISE  PROGRAM: ?Access Code: [email or web address] ?URL: [email or web address] ?Date: 01/15/2022 ?Prepared by: Shearon Balo ? ?Exercises ?Forearm Strengthening with Ball Squeeze - 3 x daily - 7 x weekly - 3 sets - 10

## 2022-04-17 NOTE — Therapy (Signed)
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   COVID-19 virus infection 02/14/2020   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) tear of right shoulder 09/30/2020   Essential hypertension    Glenohumeral arthritis 12/27/2014   History of stroke    History of TIA (transient ischemic attack) 02/14/2020   Hyperlipidemia    Hypothyroidism    Impingement syndrome of right shoulder 12/24/2016   Mixed hyperlipidemia    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   Prediabetes    Preop cardiovascular exam 11/25/2020   Pulmonary hypertension, unspecified ([place]) 11/25/2020   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   Rotator cuff impingement syndrome of right shoulder 09/30/2020   Skin rash 02/17/2021   Stroke [facility])    Tendinopathy of right biceps tendon 09/30/2020   Thyroid disease    TIA (transient ischemic attack) 04/15/2018   Tobacco use 09/28/2016   Past Surgical History:  Procedure Laterality Date   BICEPT TENODESIS Right 12/16/2021   Procedure: RIGHT BICEPS TENODESIS;  Surgeon: Leandrew Koyanagi, MD;  Location: [place];  Service: Orthopedics;  Laterality: Right;   PARTIAL HYSTERECTOMY     right arm sx     SHOULDER ARTHROSCOPY WITH BICEPSTENOTOMY Right 01/21/2021   Procedure: SHOULDER ARTHROSCOPY WITH BICEPSTENOTOMY;  Surgeon: Leandrew Koyanagi, MD;  Location: [place];  Service: Orthopedics;  Laterality: Right;   SHOULDER ARTHROSCOPY WITH DISTAL CLAVICLE RESECTION Right 01/12/2017   Procedure: RIGHT SHOULDER ARTHROSCOPY WITH  SUBACROMIAL DECOMPRESSION, DISTAL CLAVICLE EXCISION, extensive debridement;   Surgeon: Leandrew Koyanagi, MD;  Location: [place];  Service: Orthopedics;  Laterality: Right;   THYMECTOMY  2004   THYROIDECTOMY     Patient Active Problem List   Diagnosis Date Noted   Biceps tendonosis of right shoulder 02/10/2022   S/P arthroscopy of right shoulder 02/10/2022   Skin rash 02/17/2021   Preop cardiovascular exam 11/25/2020   Pulmonary hypertension, unspecified ([place]) 11/25/2020   Prediabetes    Hyperlipidemia    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) tear of right shoulder 09/30/2020   Rotator cuff impingement syndrome of right shoulder 09/30/2020   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   Tendinopathy of right biceps tendon 09/30/2020   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   Aneurysm ([place]) 03/18/2020   History of TIA (transient ischemic attack) 02/14/2020   History of stroke    Mixed hyperlipidemia    Essential hypertension    Acute pain of left shoulder 09/08/2017   Impingement syndrome of right shoulder 12/24/2016   Tobacco use 09/28/2016   Glenohumeral arthritis 12/27/2014   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    REFERRING DIAG: Referral diagnosis: Biceps tendonosis of right shoulder [M67.813]  THERAPY DIAG:  Chronic right shoulder pain  Stiffness of right shoulder, not elsewhere classified  Muscle weakness (generalized)  Localized edema  PERTINENT HISTORY: Hx of debridement and sub acromial decompression x2 (2018 and 2022). Hx of stroke, prediabetes  PRECAUTIONS/RESTRICTIONS:   PRECAUTIONS: Shoulder biceps tenodesis 1/18   WEIGHT BEARING RESTRICTIONS Yes see precautions  SUBJECTIVE: Patient reports continued shoulder pain with little improvement. She reports that she sees Dr. Erlinda Hong on 6/7.  Pain:  Are you having pain? Yes Pain location: Rt shoulder NPRS scale: current 7/10  Aggravating factors: shoulder movement Relieving factors: rest, ice Pain description: intermittent, sharp, and  aching Severity: high Irritability: low Stage: Chronic  OBJECTIVE:  UPPER EXTREMITY AROM:   ROM Right 01/06/2022 Left 01/06/2022 Right 01/28/2022 Right 02/25/2022  Shoulder flexion   140 52p! 80p!  Shoulder abduction        Shoulder internal rotation        Shoulder external rotation        Functional IR        Functional ER        Shoulder extension        Elbow extension        Elbow flexion          (Blank rows = not tested, N = WNL, * = concordant pain with testing)   UPPER EXTREMITY MMT:   MMT Right 01/06/2022 Left 01/06/2022  Shoulder flexion      Shoulder abduction (C5)      Shoulder ER      Shoulder IR      Middle trapezius      Lower trapezius      Shoulder extension      Grip strength      Cervical flexion (C1,C2)      Cervical S/B (C3)      Shoulder shrug (C4)      Elbow flexion (C6)      Elbow ext (C7)      Thumb ext (C8)      Finger abd (T1)      Grossly        (Blank rows = not tested, score listed is out of 5 possible points.  N = WNL, D = diminished, C = clear for gross weakness with myotome testing, * = concordant pain with testing)                UPPER EXTREMITY PROM:   PROM Right 01/06/2022 Left 01/06/2022 Right 01/28/2022 Right 02/09/2022 Right 02/11/2022  Shoulder flexion 95*   95p! At end range 85p! 97p!  Shoulder abduction         Shoulder internal rotation         Shoulder external rotation >40 degrees*       Functional IR         Functional ER         Shoulder extension         Elbow extension Full Full     Elbow flexion           (Blank rows = not tested, N = WNL, * = concordant pain with testing)   ASTERISK SIGNS   Asterisk Signs 2/17 2/17 2/28 3/10 3/14 3/16 3/21 3/30 4/5  Shoulder flexion PROM 100 100 (held d/t pain) 110 w/ pain  85 97     Shoulder flexion AROM     62 60  80p! 90 P!  Shoulder AAROM    90   95 98p!     TREATMENT   [facility] Adult PT Treatment:                                                DATE:  04/19/2022 Therapeutic Exercise: UBE level 2 3/3 min Wall slide - 20x Wall push up - 3x10 S/L shoulder abd  2x10 R S/L ER 3x10 R Supine AAROM flexion 2x10  biceps curl - 3# - 3x10  R shoulder ER/IR 3x10 GTB each Farmers carry - 5# - 185' Modalities: Vasopneumatic (Game Ready)   Location: right shoulder Time:  10 minutes Pressure:  low Temperature:  38 degrees   [facility] Adult PT Treatment:                                                DATE: 04/08/2022 Therapeutic Exercise: UBE level 2 3/3 min Wall slide - 20x Wall push up - 3x10 S/L shoulder abd 2x10 R S/L ER 3x10 R Supine AAROM flexion 2x10  biceps curl - 3# - 3x10  R shoulder IR 3x10 GTB Farmers carry - 5# - 185'  Modalities: Vasopneumatic (Game Ready)   Location: right shoulder Time:  10 minutes Pressure:  low Temperature:  38 degrees  [facility] Adult PT Treatment:                                                DATE: 04/08/2022 Therapeutic Exercise: UBE level 2 3/3 min Pulley flex x 2 min each S/L shoulder abd 2x10 R Prone ext 3x10 R 2# S/L ER 3x10 R Supine cane flex 2x10  biceps curl - 3# - 3x10  R shoulder IR 3x10 GTB Farmers carry - 4# - 185' Ball roll up incline on table x 20 R Modalities: Vasopneumatic (Game Ready)   Location: right shoulder Time:  10 minutes Pressure:  low Temperature:  38 degrees   HOME EXERCISE PROGRAM: Access Code: [email or web address] URL: [email or web address] Date: 01/15/2022 Prepared by: Shearon Balo  Exercises Forearm Strengthening with Greig Right - 3 x daily - 7 x weekly - 3 sets - 10 reps Seated Shoulder Flexion Towel Slide at Table Top Full Range of Motion - 2 x daily - 7 x weekly - 3 sets - 10 reps Seated Elbow Flexion and Extension AROM - 1 x daily - 7 x weekly - 3 sets - 10 reps Seated Forearm Pronation and Supination AROM - 1 x daily - 7 x weekly - 3 sets - 10 reps  Added 01/28/2022: Shoulder Scaption AAROM with Dowel - 1 x daily - 7 x weekly - 2 sets - 10 reps  - 5-sec hold Seated Shoulder External Rotation AAROM with Cane and Hand in Neutral - 1 x daily - 7 x weekly - 2 sets - 10 reps - 5-sec hold Standing Isometric Shoulder External Rotation with Doorway *50% FORCE* - 1 x daily - 7 x weekly - 2 sets - 10 reps - 5-sec hold Standing Isometric Shoulder Flexion with Doorway - Arm Bent - 1 x daily - 7 x weekly - 2 sets - 10 reps - 5-sec hold Standing Isometric Shoulder Abduction with Doorway - Arm Bent *50% FORCE* - 1 x daily - 7 x weekly - 2 sets - 10 reps - 5-sec hold    ASSESSMENT:   CLINICAL IMPRESSION: Patient presents to PT with continued high levels of pain and states she will be seeing Dr. Erlinda Hong on 05/05/2022. Session today focused on RTC strength and ROM. She remains limited by pain throughout session. Plan to D/C next visit.     Problem list: Patient presents  with pain and impairments/deficits in: shoulder strength and ROM.  Activity limitations include: reaching OH, lifting.  Participation limitations include: ADLs involving reaching, lifting, and use of R arm;     GOALS:   SHORT TERM GOALS:   STG Name Target Date Goal status  1 Lequita will be >75% HEP compliant to improve carryover between sessions and facilitate independent management of condition   Baseline: No HEP 01/27/2022 MET 2/21    LONG TERM GOALS:    LTG Name Target Date Goal status  1 Nadean will show a >/= 30 pt improvement in their QUICK DASH score (MCID is 10 pts) as a proxy for functional improvement    Baseline: 86.4  4/5:  65.9 / 100 = 65.9 % 03/03/2022 Extended until  5/31 ongoing  2 Shikha will demonstrate >120 degrees of active ROM in flexion to allow completion of activities involving reaching [place], not limited by pain   Baseline: 95 degrees passive  3/21: 110 passive, 60 AROM  4/5: 90 AROM   03/03/2022 Extended until  5/31 ongoing  3 Shanessa will be able to lift a 2# weight to second shelf x10, not limited by pain   Baseline: limited  3/21: can reach with no  weight 4/5: 1x 03/03/2022 Extended until  5/31 ongoing  Nocatee will report >/= 50% decrease in pain from evaluation    Baseline: 10/10 max pain, 7/10 average  3/21: 25% improvement 4/5: 25% 03/03/2022 Extended until  5/31 ongoing    PLAN: PT FREQUENCY: 1-2x/week   PT DURATION: 8 weeks (Ending 04/28/2022)   PLANNED INTERVENTIONS: Therapeutic exercises, Therapeutic activity, Neuro Muscular re-education, Gait training, Patient/Family education, Joint mobilization, Dry Needling, Electrical stimulation, Spinal mobilization and/or manipulation, Moist heat, Taping, Vasopneumatic device, Ionotophoresis 50m/ml Dexamethasone, and Manual therapy   PLAN FOR NEXT SESSION: Progress shoulder P/AROM as able with progressive strengthening as appropriate, posterior strengthening to relieve anterior shoulder pain, DC   SCampbell SoupPTA 04/17/22 12:10 PM

## 2022-04-19 ENCOUNTER — Ambulatory Visit: Payer: Self-pay

## 2022-04-19 DIAGNOSIS — M25611 Stiffness of right shoulder, not elsewhere classified: Secondary | ICD-10-CM

## 2022-04-19 DIAGNOSIS — M6281 Muscle weakness (generalized): Secondary | ICD-10-CM

## 2022-04-19 DIAGNOSIS — R600 Localized edema: Secondary | ICD-10-CM

## 2022-04-19 DIAGNOSIS — G8929 Other chronic pain: Secondary | ICD-10-CM

## 2022-04-21 ENCOUNTER — Encounter: Payer: Self-pay | Admitting: Physical Therapy

## 2022-04-21 ENCOUNTER — Ambulatory Visit: Payer: Self-pay | Admitting: Physical Therapy

## 2022-04-21 DIAGNOSIS — G8929 Other chronic pain: Secondary | ICD-10-CM

## 2022-04-21 DIAGNOSIS — R600 Localized edema: Secondary | ICD-10-CM

## 2022-04-21 DIAGNOSIS — M6281 Muscle weakness (generalized): Secondary | ICD-10-CM

## 2022-04-21 DIAGNOSIS — M25611 Stiffness of right shoulder, not elsewhere classified: Secondary | ICD-10-CM

## 2022-04-21 NOTE — Therapy (Signed)
PHYSICAL THERAPY DISCHARGE SUMMARY  Visits from Start of Care: 26  Current functional level related to goals / functional outcomes: See assessment/goals   Remaining deficits: See assessment/goals   Education / Equipment: HEP and D/C plans  Patient agrees to discharge. Patient goals were not met. Patient is being discharged due to maximized rehab potential.    Patient Name: ELSE HABERMANN MRN: [id number] DOB:14-Mar-1966, 56 y.o., female Today's Date: 04/21/2022  PCP: Elsie Stain, MD REFERRING PROVIDER: Nathaniel Man   PT End of Session - 04/21/22 [phone]     Visit Number 26    Number of Visits 30    Date for PT Re-Evaluation 04/28/22    Authorization Type self pay    PT Start Time 0915    PT Stop Time 0957   including vaso   PT Time Calculation (min) 42 min    Activity Tolerance Patient tolerated treatment well;Patient limited by pain    Behavior During Therapy [facility] for tasks assessed/performed                         Past Medical History:  Diagnosis Date   Acute pain of left shoulder 09/08/2017   Aneurysm ([place]) 03/18/2020   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   COVID-19 virus infection 02/14/2020   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) tear of right shoulder 09/30/2020   Essential hypertension    Glenohumeral arthritis 12/27/2014   History of stroke    History of TIA (transient ischemic attack) 02/14/2020   Hyperlipidemia    Hypothyroidism    Impingement syndrome of right shoulder 12/24/2016   Mixed hyperlipidemia    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   Prediabetes    Preop cardiovascular exam 11/25/2020   Pulmonary hypertension, unspecified ([place]) 11/25/2020   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   Rotator cuff impingement syndrome of right shoulder 09/30/2020   Skin rash 02/17/2021   Stroke [facility])    Tendinopathy of right biceps tendon 09/30/2020   Thyroid disease    TIA (transient ischemic attack)  04/15/2018   Tobacco use 09/28/2016   Past Surgical History:  Procedure Laterality Date   BICEPT TENODESIS Right 12/16/2021   Procedure: RIGHT BICEPS TENODESIS;  Surgeon: Leandrew Koyanagi, MD;  Location: [place];  Service: Orthopedics;  Laterality: Right;   PARTIAL HYSTERECTOMY     right arm sx     SHOULDER ARTHROSCOPY WITH BICEPSTENOTOMY Right 01/21/2021   Procedure: SHOULDER ARTHROSCOPY WITH BICEPSTENOTOMY;  Surgeon: Leandrew Koyanagi, MD;  Location: [place];  Service: Orthopedics;  Laterality: Right;   SHOULDER ARTHROSCOPY WITH DISTAL CLAVICLE RESECTION Right 01/12/2017   Procedure: RIGHT SHOULDER ARTHROSCOPY WITH  SUBACROMIAL DECOMPRESSION, DISTAL CLAVICLE EXCISION, extensive debridement;  Surgeon: Leandrew Koyanagi, MD;  Location: [place];  Service: Orthopedics;  Laterality: Right;   THYMECTOMY  2004   THYROIDECTOMY     Patient Active Problem List   Diagnosis Date Noted   Biceps tendonosis of right shoulder 02/10/2022   S/P arthroscopy of right shoulder 02/10/2022   Skin rash 02/17/2021   Preop cardiovascular exam 11/25/2020   Pulmonary hypertension, unspecified ([place]) 11/25/2020   Prediabetes    Hyperlipidemia    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) tear of right shoulder 09/30/2020   Rotator cuff impingement syndrome of right shoulder 09/30/2020   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   Tendinopathy of right biceps tendon 09/30/2020   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   Aneurysm ([place]) 03/18/2020  History of TIA (transient ischemic attack) 02/14/2020   History of stroke    Mixed hyperlipidemia    Essential hypertension    Acute pain of left shoulder 09/08/2017   Impingement syndrome of right shoulder 12/24/2016   Tobacco use 09/28/2016   Glenohumeral arthritis 12/27/2014   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    REFERRING DIAG: Referral diagnosis: Biceps tendonosis of right shoulder  [M67.813]  THERAPY DIAG:  Chronic right shoulder pain  Stiffness of right shoulder, not elsewhere classified  Muscle weakness (generalized)  Localized edema  PERTINENT HISTORY: Hx of debridement and sub acromial decompression x2 (2018 and 2022). Hx of stroke, prediabetes  PRECAUTIONS/RESTRICTIONS:   PRECAUTIONS: Shoulder biceps tenodesis 1/18   WEIGHT BEARING RESTRICTIONS Yes see precautions  SUBJECTIVE:   Pt reports that she continues to have shoulder pain and she is frustrated.  She will return to [place] on 6/7.  Pain:  Are you having pain? Yes Pain location: Rt shoulder NPRS scale: current 7/10  Aggravating factors: shoulder movement Relieving factors: rest, ice Pain description: intermittent, sharp, and aching Severity: high Irritability: low Stage: Chronic  OBJECTIVE:  UPPER EXTREMITY AROM:   ROM Right 01/06/2022 Left 01/06/2022 Right 01/28/2022 Right 02/25/2022  Shoulder flexion   140 52p! 80p!  Shoulder abduction        Shoulder internal rotation        Shoulder external rotation        Functional IR        Functional ER        Shoulder extension        Elbow extension        Elbow flexion          (Blank rows = not tested, N = WNL, * = concordant pain with testing)   UPPER EXTREMITY MMT:   MMT Right 01/06/2022 Left 01/06/2022  Shoulder flexion      Shoulder abduction (C5)      Shoulder ER      Shoulder IR      Middle trapezius      Lower trapezius      Shoulder extension      Grip strength      Cervical flexion (C1,C2)      Cervical S/B (C3)      Shoulder shrug (C4)      Elbow flexion (C6)      Elbow ext (C7)      Thumb ext (C8)      Finger abd (T1)      Grossly        (Blank rows = not tested, score listed is out of 5 possible points.  N = WNL, D = diminished, C = clear for gross weakness with myotome testing, * = concordant pain with testing)                UPPER EXTREMITY PROM:   PROM Right 01/06/2022 Left 01/06/2022 Right 01/28/2022  Right 02/09/2022 Right 02/11/2022  Shoulder flexion 95*   95p! At end range 85p! 97p!  Shoulder abduction         Shoulder internal rotation         Shoulder external rotation >40 degrees*       Functional IR         Functional ER         Shoulder extension         Elbow extension Full Full     Elbow flexion           (  Blank rows = not tested, N = WNL, * = concordant pain with testing)   ASTERISK SIGNS   Asterisk Signs 2/17 2/17 2/28 3/10 3/14 3/16 3/21 3/30 4/5  Shoulder flexion PROM 100 100 (held d/t pain) 110 w/ pain  85 97     Shoulder flexion AROM     62 60  80p! 90 P!  Shoulder AAROM    90   95 98p!     TREATMENT   [facility] Adult PT Treatment:                                                DATE: 04/21/2022 Therapeutic Exercise: UBE level 2 3/3 min Wall slide - 20x Wall push up - 3x10 S/L shoulder abd 2x10 R S/L ER 3x15 R Supine AAROM flexion 2x10   Therapeutic Activity - collecting information for goals, checking progress, and reviewing with patient  Modalities: Vasopneumatic (Game Ready)   Location: right shoulder Time:  10 minutes Pressure:  low Temperature:  38 degrees  [facility] Adult PT Treatment:                                                DATE: 04/19/2022 Therapeutic Exercise: UBE level 2 3/3 min Wall slide - 20x Wall push up - 3x10 S/L shoulder abd 2x10 R S/L ER 3x10 R Supine AAROM flexion 2x10  biceps curl - 3# - 3x10  R shoulder ER/IR 3x10 GTB each Farmers carry - 5# - 185' Modalities: Vasopneumatic (Game Ready)   Location: right shoulder Time:  10 minutes Pressure:  low Temperature:  38 degrees   [facility] Adult PT Treatment:                                                DATE: 04/08/2022 Therapeutic Exercise: UBE level 2 3/3 min Wall slide - 20x Wall push up - 3x10 S/L shoulder abd 2x10 R S/L ER 3x10 R Supine AAROM flexion 2x10  biceps curl - 3# - 3x10  R shoulder IR 3x10 GTB Farmers carry - 5# - 185'  Modalities: Vasopneumatic (Game  Ready)   Location: right shoulder Time:  10 minutes Pressure:  low Temperature:  38 degrees  [facility] Adult PT Treatment:                                                DATE: 04/08/2022 Therapeutic Exercise: UBE level 2 3/3 min Pulley flex x 2 min each S/L shoulder abd 2x10 R Prone ext 3x10 R 2# S/L ER 3x10 R Supine cane flex 2x10  biceps curl - 3# - 3x10  R shoulder IR 3x10 GTB Farmers carry - 4# - 185' Ball roll up incline on table x 20 R Modalities: Vasopneumatic (Game Ready)   Location: right shoulder Time:  10 minutes Pressure:  low Temperature:  38 degrees   HOME EXERCISE PROGRAM: Access Code: [email or web address] URL: [email or web address] Date: 01/15/2022 Prepared by: Shearon Balo  Exercises Forearm Strengthening  with Ball Squeeze - 3 x daily - 7 x weekly - 3 sets - 10 reps Seated Shoulder Flexion Towel Slide at Table Top Full Range of Motion - 2 x daily - 7 x weekly - 3 sets - 10 reps Seated Elbow Flexion and Extension AROM - 1 x daily - 7 x weekly - 3 sets - 10 reps Seated Forearm Pronation and Supination AROM - 1 x daily - 7 x weekly - 3 sets - 10 reps  Added 01/28/2022: Shoulder Scaption AAROM with Dowel - 1 x daily - 7 x weekly - 2 sets - 10 reps - 5-sec hold Seated Shoulder External Rotation AAROM with Cane and Hand in Neutral - 1 x daily - 7 x weekly - 2 sets - 10 reps - 5-sec hold Standing Isometric Shoulder External Rotation with Doorway *50% FORCE* - 1 x daily - 7 x weekly - 2 sets - 10 reps - 5-sec hold Standing Isometric Shoulder Flexion with Doorway - Arm Bent - 1 x daily - 7 x weekly - 2 sets - 10 reps - 5-sec hold Standing Isometric Shoulder Abduction with Doorway - Arm Bent *50% FORCE* - 1 x daily - 7 x weekly - 2 sets - 10 reps - 5-sec hold    ASSESSMENT:   CLINICAL IMPRESSION: LATRELLE FUSTON has progressed fair with therapy.  Improved impairments include: shoulder strength and ROM.  Functional improvements include: ability to use shoulder for  light activities at home.  Progressions needed include: follow up with MD for next steps, as pt is still having high level of pain.  Barriers to progress include: chronic pain, multiple surgeries.  Layliana has gained some function in her shoulder but continues to endorse high levels of pain which is limiting in her ADLs.  Please see GOALS section for progress on short term and long term goals established at evaluation.  I recommend D/C home with HEP; pt agrees with plan.    Problem list: Patient presents with pain and impairments/deficits in: shoulder strength and ROM.  Activity limitations include: reaching OH, lifting.  Participation limitations include: ADLs involving reaching, lifting, and use of R arm;     GOALS:   SHORT TERM GOALS:   STG Name Target Date Goal status  1 Zina will be >75% HEP compliant to improve carryover between sessions and facilitate independent management of condition   Baseline: No HEP 01/27/2022 MET 2/21    LONG TERM GOALS:    LTG Name Target Date Goal status  1 Alekhya will show a >/= 30 pt improvement in their QUICK DASH score (MCID is 10 pts) as a proxy for functional improvement    Baseline: 86.4  4/5:  65.9 / 100 = 65.9 %  5/24: 61.4 / 100 = 61.4 % 03/03/2022 Extended until  5/31 Partially MET  2 Giulietta will demonstrate >120 degrees of active ROM in flexion to allow completion of activities involving reaching [place], not limited by pain   Baseline: 95 degrees passive  3/21: 110 passive, 60 AROM  4/5: 90 AROM  5/24: 105 AROM 03/03/2022 Extended until  5/31 Partially MET  3 Trini will be able to lift a 2# weight to second shelf x10, not limited by pain   Baseline: limited  3/21: can reach with no weight 4/5: 1x 5/24:  3x 03/03/2022 Extended until  5/31 Partially MET  4 Brittinee will report >/= 50% decrease in pain from evaluation    Baseline: 10/10 max pain, 7/10 average  3/21:  25% improvement 4/5: 25% 5/24: 25% 03/03/2022 Extended until  5/31 NOT MET     PLAN: PT FREQUENCY: 1-2x/week   PT DURATION: 8 weeks (Ending 04/28/2022)   PLANNED INTERVENTIONS: Therapeutic exercises, Therapeutic activity, Neuro Muscular re-education, Gait training, Patient/Family education, Joint mobilization, Dry Needling, Electrical stimulation, Spinal mobilization and/or manipulation, Moist heat, Taping, Vasopneumatic device, Ionotophoresis 4mg /ml Dexamethasone, and Manual therapy   PLAN FOR NEXT SESSION: Progress shoulder P/AROM as able with progressive strengthening as appropriate, posterior strengthening to relieve anterior shoulder pain, DC   Kevan Ny Saquoia Sianez PT 04/21/22 9:51 AM

## 2022-04-22 ENCOUNTER — Ambulatory Visit (INDEPENDENT_AMBULATORY_CARE_PROVIDER_SITE_OTHER): Payer: Self-pay | Admitting: Cardiology

## 2022-04-22 ENCOUNTER — Encounter: Payer: Self-pay | Admitting: Cardiology

## 2022-04-22 VITALS — BP 132/70 | HR 72 | Ht 68.0 in | Wt 210.0 lb

## 2022-04-22 DIAGNOSIS — I10 Essential (primary) hypertension: Secondary | ICD-10-CM

## 2022-04-22 DIAGNOSIS — I272 Pulmonary hypertension, unspecified: Secondary | ICD-10-CM

## 2022-04-22 DIAGNOSIS — Z720 Tobacco use: Secondary | ICD-10-CM

## 2022-04-22 DIAGNOSIS — E782 Mixed hyperlipidemia: Secondary | ICD-10-CM

## 2022-04-22 NOTE — Patient Instructions (Addendum)
Medication Instructions:  Your physician recommends that you continue on your current medications as directed. Please refer to the Current Medication list given to you today.  *If you need a refill on your cardiac medications before your next appointment, please call your pharmacy*   Lab Work:  [facility] [place] need to have labs done when you are fasting.  You can come Monday through Wednesday 8:00 am to 11:30 am and 1:00 to 4:30. Thursday and Friday 8:00 to 11:30am- only. You do not need to make an appointment as the order has already been placed. The labs you are going to have done are Lipids.    Testing/Procedures: None Ordered   Follow-Up: At [facility], you and your health needs are our priority.  As part of our continuing mission to provide you with exceptional heart care, we have created designated Provider Care Teams.  These Care Teams include your primary Cardiologist (physician) and Advanced Practice Providers (APPs -  Physician Assistants and Nurse Practitioners) who all work together to provide you with the care you need, when you need it.  We recommend signing up for the patient portal called "MyChart".  Sign up information is provided on this After Visit Summary.  MyChart is used to connect with patients for Virtual Visits (Telemedicine).  Patients are able to view lab/test results, encounter notes, upcoming appointments, etc.  Non-urgent messages can be sent to your provider as well.   To learn more about what you can do with MyChart, go to [email or web address].    Your next appointment:   6 month(s)  The format for your next appointment:   In Person  Provider:   Jenne Campus, MD    Other Instructions NA

## 2022-04-22 NOTE — Progress Notes (Signed)
Cardiology Office Note:    Date:  04/22/2022   ID:  Meagan Flores, DOB March 17, 1966, MRN [id number]  PCP:  Elsie Stain, MD  Cardiologist:  Jenne Campus, MD    Referring MD: Elsie Stain, MD   Chief Complaint  Patient presents with   Follow-up  Cardiac wise I am doing fine  History of Present Illness:    Meagan Flores is a 56 y.o. female past medical history significant for essential hypertension, dyslipidemia, smoking which is still ongoing she also got a chronic right shoulder problem and required 3 shoulder surgeries.  Another issue was potentially having pulmonary hypertension however echocardiogram did not confirm that.  She comes today to my office in follow-up.  Overall she seems to be doing well.  She denies have any chest pain tightness squeezing pressure burning chest.  Still continues to smoke she understands the problem she is trying to quit.  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   Prediabetes    Preop cardiovascular exam 11/25/2020   Pulmonary hypertension, unspecified ([place]) 11/25/2020   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   Rotator cuff impingement syndrome of right shoulder 09/30/2020   Skin rash 02/17/2021   Stroke [facility])    Tendinopathy of right biceps tendon 09/30/2020   Thyroid disease    TIA (transient ischemic attack) 04/15/2018   Tobacco use 09/28/2016    Past Surgical History:  Procedure Laterality Date   BICEPT  TENODESIS Right 12/16/2021   Procedure: RIGHT BICEPS TENODESIS;  Surgeon: Leandrew Koyanagi, MD;  Location: [place];  Service: Orthopedics;  Laterality: Right;   PARTIAL HYSTERECTOMY     right arm sx     SHOULDER ARTHROSCOPY WITH BICEPSTENOTOMY Right 01/21/2021   Procedure: SHOULDER ARTHROSCOPY WITH BICEPSTENOTOMY;  Surgeon: Leandrew Koyanagi, MD;  Location: [place];  Service: Orthopedics;  Laterality: Right;   SHOULDER ARTHROSCOPY WITH DISTAL CLAVICLE RESECTION Right 01/12/2017   Procedure: RIGHT SHOULDER ARTHROSCOPY WITH  SUBACROMIAL DECOMPRESSION, DISTAL CLAVICLE EXCISION, extensive debridement;  Surgeon: Leandrew Koyanagi, MD;  Location: [place];  Service: Orthopedics;  Laterality: Right;   THYMECTOMY  2004   THYROIDECTOMY      Current Medications: Current Meds  Medication Sig   acetaminophen-codeine (TYLENOL #3) 300-30 MG tablet Take 1 tablet by mouth 2 (two) times daily as needed for moderate pain.   aspirin EC 81 MG tablet Take 1 tablet (81 mg total) by mouth daily.   atorvastatin (LIPITOR) 40 MG tablet Take 1 tablet (40 mg total) by mouth daily at 6 PM.   clobetasol cream (TEMOVATE) 7.34 % Apply 1 application topically 2 (two) times daily.   HYDROcodone-acetaminophen (NORCO) 5-325 MG tablet Take 1 tablet by mouth 3 (three) times daily as needed. (Patient taking differently: Take 1 tablet by mouth 3 (three) times daily as needed for moderate pain or severe pain.)   levothyroxine (EUTHYROX) 50 MCG tablet Take 2 tablets (100 mcg total) by mouth daily before  breakfast.   metoprolol succinate (TOPROL-XL) 50 MG 24 hr tablet Take 1 tablet (50 mg total) by mouth daily. Take with or immediately following a meal.   oxyCODONE-acetaminophen (PERCOCET) 5-325 MG tablet Take 1-2 tablets by mouth every 8 (eight) hours as needed for severe pain.   pregabalin (LYRICA) 150 MG capsule Take 1 capsule (150 mg total) by mouth 2 (two) times daily.   valsartan (DIOVAN)  160 MG tablet Take 1 tablet (160 mg total) by mouth daily.     Allergies:   Sulfa antibiotics   Social History   Socioeconomic History   Marital status: Married    Spouse name: Not on file   Number of children: Not on file   Years of education: Not on file   Highest education level: Not on file  Occupational History   Not on file  Tobacco Use   Smoking status: Every Day    Packs/day: 0.50    Years: 34.00    Pack years: 17.00    Types: Cigarettes   Smokeless tobacco: Never  Vaping Use   Vaping Use: Never used  Substance and Sexual Activity   Alcohol use: Yes    Comment: occassional   Drug use: No   Sexual activity: Not on file  Other Topics Concern   Not on file  Social History Narrative   Not on file   Social Determinants of Health   Financial Resource Strain: Not on file  Food Insecurity: Not on file  Transportation Needs: Not on file  Physical Activity: Not on file  Stress: Not on file  Social Connections: Not on file     Family History: The patient's family history includes Colon polyps in her sister; Stomach cancer in her sister. There is no history of Esophageal cancer, Rectal cancer, Colon cancer, or Breast cancer. ROS:   Please see the history of present illness.    All 14 point review of systems negative except as described per history of present illness  EKGs/Labs/Other Studies Reviewed:      Recent Labs: 06/29/2021: TSH 6.990 01/27/2022: ALT 11; BUN 9; Creatinine, Ser 0.80; Hemoglobin 12.2; Platelets 375; Potassium 4.6; Sodium 145  Recent Lipid Panel    Component Value Date/Time   CHOL 253 (H) 01/27/2022 0905   TRIG 97 01/27/2022 0905   HDL 42 01/27/2022 0905   CHOLHDL 6.0 (H) 01/27/2022 0905   CHOLHDL 4.5 04/15/2018 0058   VLDL 28 04/15/2018 0058   LDLCALC 194 (H) 01/27/2022 0905    Physical Exam:    VS:  BP 132/70 (BP Location: Left Arm, Patient Position: Sitting)   Pulse 72   Ht '5\' 8"'$  (1.727 m)   Wt 210 lb (95.3 kg)   SpO2 97%   BMI  31.93 kg/m     Wt Readings from Last 3 Encounters:  04/22/22 210 lb (95.3 kg)  03/24/22 215 lb 12.8 oz (97.9 kg)  01/27/22 213 lb 12.8 oz (97 kg)     GEN:  Well nourished, well developed in no acute distress HEENT: Normal NECK: No JVD; No carotid bruits LYMPHATICS: No lymphadenopathy CARDIAC: RRR, no murmurs, no rubs, no gallops RESPIRATORY:  Clear to auscultation without rales, wheezing or rhonchi  ABDOMEN: Soft, non-tender, non-distended MUSCULOSKELETAL:  No edema; No deformity  SKIN: Warm and dry LOWER EXTREMITIES: no swelling NEUROLOGIC:  Alert and oriented x 3 PSYCHIATRIC:  Normal affect   ASSESSMENT:    1. Essential hypertension   2. Pulmonary hypertension, unspecified ([place])   3. Tobacco use  4. Mixed hyperlipidemia    PLAN:    In order of problems listed above:  Essential hypertension blood pressure well controlled continue present management. Pulmonary hypertension denies having excessive shortness of breath nothing seems to be changing, echocardiogram showed normal pulmonary pressure continue monitoring Smoking which is obviously huge problem spent at least 10 minutes talking about this I strongly recommended to quit. Dyslipidemia I did review K PN data from March show me LDL of 194 HDL 42 she is supposed to be on Lipitor 20 but she admits when she had this cholesterol down she was not taking that medication.  Now she says she takes this on the regular basis, will check her fasting lipid profile.   Medication Adjustments/Labs and Tests Ordered: Current medicines are reviewed at length with the patient today.  Concerns regarding medicines are outlined above.  No orders of the defined types were placed in this encounter.  Medication changes: No orders of the defined types were placed in this encounter.   Signed, Park Liter, MD, [facility] 04/22/2022 2:05 PM    [place]

## 2022-04-22 NOTE — Addendum Note (Signed)
Addended by: Jacobo Forest D on: 04/22/2022 02:13 PM   Modules accepted: Orders

## 2022-05-05 ENCOUNTER — Encounter: Payer: Self-pay | Admitting: Orthopaedic Surgery

## 2022-05-05 ENCOUNTER — Ambulatory Visit (INDEPENDENT_AMBULATORY_CARE_PROVIDER_SITE_OTHER): Payer: Self-pay | Admitting: Orthopaedic Surgery

## 2022-05-05 VITALS — Ht 68.0 in | Wt 213.0 lb

## 2022-05-05 DIAGNOSIS — G8929 Other chronic pain: Secondary | ICD-10-CM

## 2022-05-05 DIAGNOSIS — M25511 Pain in right shoulder: Secondary | ICD-10-CM

## 2022-05-05 NOTE — Progress Notes (Signed)
Post-Op Visit Note   Patient: Meagan Flores           Date of Birth: 25-Mar-1966           MRN: [id number] Visit Date: 05/05/2022 PCP: Elsie Stain, MD   Assessment & Plan:  Chief Complaint:  Chief Complaint  Patient presents with   Right Shoulder - Follow-up    Right shoulder biceps tenodesis 12/16/2021   Visit Diagnoses:  1. Chronic right shoulder pain     Plan: Patient is a pleasant 56 year old female comes in today nearly 5 months status post right shoulder biceps tenodesis 12/16/2021.  She has continued pain and stiffness to the right shoulder.  When she was seen in our office back in April she was referred to Dr. Ernestina Patches for intra-articular cortisone injection with concern for adhesive capsulitis.  She was never contacted for the injection.  She has continued with physical therapy which has plateaued.  Examination of her right shoulder reveals forward flexion to approximately 120 degrees.  Moderately limited internal and external rotation.  She is neurovascularly intact distally.  At this point, we have made a new referral to Dr. Ernestina Patches for right shoulder injection.  I am also put in a new referral for physical therapy which she has been instructed do not start until she has had the injection.  She will follow-up with [place] 4 weeks after the injection for repeat evaluation.  Call with concerns or questions in meantime.  Follow-Up Instructions: Return for f/u 4 weeks after injection.   Orders:  Orders Placed This Encounter  Procedures   Ambulatory referral to Physical Therapy   No orders of the defined types were placed in this encounter.   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   Tendinopathy of right biceps tendon 09/30/2020   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   Aneurysm ([place]) 03/18/2020   History of TIA (transient ischemic attack) 02/14/2020   History of stroke    Mixed hyperlipidemia    Essential hypertension    Acute pain of left shoulder 09/08/2017   Impingement syndrome of right shoulder 12/24/2016   Tobacco use 09/28/2016   Glenohumeral arthritis 12/27/2014   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   Past Medical History:  Diagnosis Date   Acute pain of left shoulder 09/08/2017   Aneurysm ([place]) 03/18/2020   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   COVID-19 virus infection 02/14/2020   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) tear of right shoulder 09/30/2020   Essential hypertension    Glenohumeral arthritis 12/27/2014   History of stroke    History of TIA (transient ischemic attack) 02/14/2020   Hyperlipidemia    Hypothyroidism    Impingement syndrome of right shoulder 12/24/2016   Mixed hyperlipidemia    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   Prediabetes    Preop cardiovascular exam 11/25/2020   Pulmonary hypertension, unspecified ([place]) 11/25/2020   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   Rotator cuff impingement syndrome of right shoulder 09/30/2020   Skin rash 02/17/2021   Stroke ([place])  Tendinopathy of right biceps tendon 09/30/2020   Thyroid disease    TIA (transient ischemic attack) 04/15/2018   Tobacco use 09/28/2016    Family History  Problem Relation Age of Onset   Stomach cancer Sister    Colon polyps Sister    Esophageal cancer Neg Hx    Rectal cancer Neg Hx    Colon cancer Neg Hx    Breast cancer Neg Hx     Past Surgical History:  Procedure Laterality Date   BICEPT TENODESIS  Right 12/16/2021   Procedure: RIGHT BICEPS TENODESIS;  Surgeon: Leandrew Koyanagi, MD;  Location: [place];  Service: Orthopedics;  Laterality: Right;   PARTIAL HYSTERECTOMY     right arm sx     SHOULDER ARTHROSCOPY WITH BICEPSTENOTOMY Right 01/21/2021   Procedure: SHOULDER ARTHROSCOPY WITH BICEPSTENOTOMY;  Surgeon: Leandrew Koyanagi, MD;  Location: [place];  Service: Orthopedics;  Laterality: Right;   SHOULDER ARTHROSCOPY WITH DISTAL CLAVICLE RESECTION Right 01/12/2017   Procedure: RIGHT SHOULDER ARTHROSCOPY WITH  SUBACROMIAL DECOMPRESSION, DISTAL CLAVICLE EXCISION, extensive debridement;  Surgeon: Leandrew Koyanagi, MD;  Location: [place];  Service: Orthopedics;  Laterality: Right;   THYMECTOMY  2004   THYROIDECTOMY     Social History   Occupational History   Not on file  Tobacco Use   Smoking status: Every Day    Packs/day: 0.50    Years: 34.00    Pack years: 17.00    Types: Cigarettes   Smokeless tobacco: Never  Vaping Use   Vaping Use: Never used  Substance and Sexual Activity   Alcohol use: Yes    Comment: occassional   Drug use: No   Sexual activity: Not on file

## 2022-05-25 ENCOUNTER — Other Ambulatory Visit: Payer: Self-pay

## 2022-05-25 ENCOUNTER — Emergency Department (HOSPITAL_COMMUNITY): Payer: Self-pay

## 2022-05-25 ENCOUNTER — Encounter (HOSPITAL_COMMUNITY): Payer: Self-pay | Admitting: Emergency Medicine

## 2022-05-25 ENCOUNTER — Emergency Department (HOSPITAL_COMMUNITY)
Admission: EM | Admit: 2022-05-25 | Discharge: 2022-05-26 | Disposition: A | Discharge status: Home / Self Care | Payer: Self-pay | Attending: Emergency Medicine | Admitting: Emergency Medicine

## 2022-05-25 ENCOUNTER — Ambulatory Visit: Payer: Self-pay | Attending: Physician Assistant

## 2022-05-25 DIAGNOSIS — R0602 Shortness of breath: Secondary | ICD-10-CM | POA: Insufficient documentation

## 2022-05-25 DIAGNOSIS — M6281 Muscle weakness (generalized): Secondary | ICD-10-CM | POA: Insufficient documentation

## 2022-05-25 DIAGNOSIS — R1013 Epigastric pain: Secondary | ICD-10-CM | POA: Insufficient documentation

## 2022-05-25 DIAGNOSIS — Z7982 Long term (current) use of aspirin: Secondary | ICD-10-CM | POA: Insufficient documentation

## 2022-05-25 DIAGNOSIS — R112 Nausea with vomiting, unspecified: Secondary | ICD-10-CM | POA: Insufficient documentation

## 2022-05-25 DIAGNOSIS — M25611 Stiffness of right shoulder, not elsewhere classified: Secondary | ICD-10-CM | POA: Insufficient documentation

## 2022-05-25 DIAGNOSIS — G8929 Other chronic pain: Secondary | ICD-10-CM | POA: Insufficient documentation

## 2022-05-25 DIAGNOSIS — R079 Chest pain, unspecified: Secondary | ICD-10-CM | POA: Insufficient documentation

## 2022-05-25 DIAGNOSIS — Z79899 Other long term (current) drug therapy: Secondary | ICD-10-CM | POA: Insufficient documentation

## 2022-05-25 DIAGNOSIS — M25511 Pain in right shoulder: Secondary | ICD-10-CM | POA: Insufficient documentation

## 2022-05-25 DIAGNOSIS — I10 Essential (primary) hypertension: Secondary | ICD-10-CM | POA: Insufficient documentation

## 2022-05-25 LAB — TROPONIN I (HIGH SENSITIVITY): Troponin I (High Sensitivity): 4 ng/L (ref <18)

## 2022-05-25 LAB — COMPREHENSIVE METABOLIC PANEL
ALT: 17 U/L (ref 0–44)
AST: 24 U/L (ref 15–41)
Albumin: 3.7 g/dL (ref 3.5–5.0)
Alkaline Phosphatase: 90 U/L (ref 38–126)
Anion gap: 10 (ref 5–15)
BUN: 11 mg/dL (ref 6–20)
CO2: 23 mmol/L (ref 22–32)
Calcium: 8.4 mg/dL — ABNORMAL LOW (ref 8.9–10.3)
Chloride: 106 mmol/L (ref 98–111)
Creatinine, Ser: 0.78 mg/dL (ref 0.44–1.00)
GFR, Estimated: >60 mL/min (ref >60)
Glucose, Bld: 119 mg/dL — ABNORMAL HIGH (ref 70–99)
Potassium: 3.9 mmol/L (ref 3.5–5.1)
Sodium: 139 mmol/L (ref 135–145)
Total Bilirubin: 0.6 mg/dL (ref 0.3–1.2)
Total Protein: 6.6 g/dL (ref 6.5–8.1)

## 2022-05-25 LAB — CBC WITH DIFFERENTIAL/PLATELET
Abs Immature Granulocytes: 0.02 10*3/uL (ref 0.00–0.07)
Basophils Absolute: 0 10*3/uL (ref 0.0–0.1)
Basophils Relative: 0 %
Eosinophils Absolute: 0.1 10*3/uL (ref 0.0–0.5)
Eosinophils Relative: 1 %
HCT: 39.6 % (ref 36.0–46.0)
Hemoglobin: 12.7 g/dL (ref 12.0–15.0)
Immature Granulocytes: 0 %
Lymphocytes Relative: 40 %
Lymphs Abs: 3.1 10*3/uL (ref 0.7–4.0)
MCH: 24.6 pg — ABNORMAL LOW (ref 26.0–34.0)
MCHC: 32.1 g/dL (ref 30.0–36.0)
MCV: 76.6 fL — ABNORMAL LOW (ref 80.0–100.0)
Monocytes Absolute: 0.6 10*3/uL (ref 0.1–1.0)
Monocytes Relative: 8 %
Neutro Abs: 3.8 10*3/uL (ref 1.7–7.7)
Neutrophils Relative %: 51 %
Platelets: 216 10*3/uL (ref 150–400)
RBC: 5.17 MIL/uL — ABNORMAL HIGH (ref 3.87–5.11)
RDW: 16.4 % — ABNORMAL HIGH (ref 11.5–15.5)
WBC: 7.6 10*3/uL (ref 4.0–10.5)
nRBC: 0 % (ref 0.0–0.2)

## 2022-05-25 LAB — LIPASE, BLOOD: Lipase: 37 U/L (ref 11–51)

## 2022-05-25 NOTE — ED Provider Triage Note (Signed)
Emergency Medicine Provider Triage Evaluation Note  Meagan Flores , a 56 y.o. female  was evaluated in triage.  Pt complains of chest pain been going on since yesterday, pain is mainly in her epigastric is most behind her sternum, pain is constant, but does worsen when laying down, has associated nausea and vomiting will occasionally have shortness of breath but this is not constant, pain does not radiate, denies lightheaded or dizziness or near syncope, no calf tenderness, has history of hypertension, pulmonary hypertension, is a current smoker, as well as hyperlipidemia.  Is followed by cardiology..  Review of Systems  Positive: Chest pain, epigastric pain Negative: Pleuritic chest pain, leg swelling  Physical Exam  BP (!) 142/85 (BP Location: Right Arm)   Pulse 71   Temp 98.7 F (37.1 C) (Oral)   Resp 16   SpO2 100%  Gen:   Awake, no distress chest pain reproducible Resp:  Normal effort  MSK:   Moves extremities without difficulty  Other:    Medical Decision Making  Medically screening exam initiated at 10:36 PM.  Appropriate orders placed.  Meagan Flores was informed that the remainder of the evaluation will be completed by another provider, this initial triage assessment does not replace that evaluation, and the importance of remaining in the ED until their evaluation is complete.  Presented with chest pain, lab work imaging been ordered will need further work-up.   Meagan Fennel, PA-C 05/25/22 2238

## 2022-05-25 NOTE — Therapy (Signed)
OUTPATIENT PHYSICAL THERAPY SHOULDER EVALUATION   Patient Name: Meagan Flores MRN: [id number] DOB:26-Feb-1966, 56 y.o., female Today's Date: 05/25/2022   PT End of Session - 05/25/22 1412     Visit Number 1    Number of Visits 9    Date for PT Re-Evaluation 07/02/22    Authorization Type CAFA    PT Start Time 0720    PT Stop Time 0804    PT Time Calculation (min) 44 min    Activity Tolerance Patient tolerated treatment well;Patient limited by pain    Behavior During Therapy [facility] for tasks assessed/performed             Past Medical History:  Diagnosis Date   Acute pain of left shoulder 09/08/2017   Aneurysm ([place]) 03/18/2020   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   COVID-19 virus infection 02/14/2020   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) tear of right shoulder 09/30/2020   Essential hypertension    Glenohumeral arthritis 12/27/2014   History of stroke    History of TIA (transient ischemic attack) 02/14/2020   Hyperlipidemia    Hypothyroidism    Impingement syndrome of right shoulder 12/24/2016   Mixed hyperlipidemia    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   Prediabetes    Preop cardiovascular exam 11/25/2020   Pulmonary hypertension, unspecified ([place]) 11/25/2020   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   Rotator cuff impingement syndrome of right shoulder 09/30/2020   Skin rash 02/17/2021   Stroke [facility])    Tendinopathy of right biceps tendon 09/30/2020   Thyroid disease    TIA (transient ischemic attack) 04/15/2018   Tobacco use 09/28/2016   Past Surgical History:  Procedure Laterality Date   BICEPT TENODESIS Right 12/16/2021   Procedure: RIGHT BICEPS TENODESIS;  Surgeon: Leandrew Koyanagi, MD;  Location: [place];  Service: Orthopedics;  Laterality: Right;   PARTIAL HYSTERECTOMY     right arm sx     SHOULDER ARTHROSCOPY WITH BICEPSTENOTOMY Right 01/21/2021   Procedure: SHOULDER ARTHROSCOPY WITH BICEPSTENOTOMY;  Surgeon: Leandrew Koyanagi, MD;  Location: [place];  Service: Orthopedics;  Laterality: Right;   SHOULDER ARTHROSCOPY WITH DISTAL CLAVICLE RESECTION Right 01/12/2017   Procedure: RIGHT SHOULDER ARTHROSCOPY WITH  SUBACROMIAL DECOMPRESSION, DISTAL CLAVICLE EXCISION, extensive debridement;  Surgeon: Leandrew Koyanagi, MD;  Location: [place];  Service: Orthopedics;  Laterality: Right;   THYMECTOMY  2004   THYROIDECTOMY     Patient Active Problem List   Diagnosis Date Noted   Biceps tendonosis of right shoulder 02/10/2022   S/P arthroscopy of right shoulder 02/10/2022   Skin rash 02/17/2021   Preop cardiovascular exam 11/25/2020   Pulmonary hypertension, unspecified ([place]) 11/25/2020   Prediabetes    Hyperlipidemia    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) tear of right shoulder 09/30/2020   Rotator cuff impingement syndrome of right shoulder 09/30/2020   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   Tendinopathy of right biceps tendon 09/30/2020   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   Aneurysm ([place]) 03/18/2020   History of TIA (transient ischemic attack) 02/14/2020   History of stroke    Mixed hyperlipidemia    Essential hypertension    Acute pain of left shoulder 09/08/2017   Impingement syndrome of right shoulder 12/24/2016   Tobacco use 09/28/2016   Glenohumeral arthritis 12/27/2014   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    PCP: Elsie Stain, MD   REFERRING PROVIDER: Aundra Dubin, PA-C  REFERRING DIAG: Chronic right shoulder pain; Frozen shoulder  THERAPY DIAG:  Chronic right shoulder pain  Stiffness of right shoulder, not elsewhere classified  Muscle weakness (generalized)  Rationale for Evaluation and Treatment Rehabilitation  ONSET DATE: Chronic; right shoulder biceps tenodesis 12/16/2021  SUBJECTIVE:                                                                                                                                                                                       SUBJECTIVE STATEMENT: Pt reports her R shoulder is still hurting her. She is to have an injection 7/13.23  PERTINENT HISTORY: Hx of debridement and sub acromial decompression x2 (2018 and 2022). Hx of stroke, prediabetes  PAIN:  Are you having pain? Yes: NPRS scale: 7/10 Pain location: Whole R shoulder, more in the area Ant R shoulder Pain description: ache, constant Aggravating factors: using arm to press up from a chiar, turning the R arm out Relieving factors: Pain medications  PRECAUTIONS:  None  WEIGHT BEARING RESTRICTIONS No  FALLS:  Has patient fallen in last 6 months? No  LIVING ENVIRONMENT: Lives with: lives with their family Lives in: House/apartment No issue with accessing or mobility within home  OCCUPATION: Not working. Has applied fro disability  PLOF: Independent  PATIENT GOALS Fot my arm to get better, less pain  OBJECTIVE:   DIAGNOSTIC FINDINGS:  NA  PATIENT SURVEYS:  FOTO 41% perceived function; 61% predicted  COGNITION:  Overall cognitive status: Within functional limits for tasks assessed     SENSATION: WFL  POSTURE: Forward head, rounded shoulders  UPPER EXTREMITY ROM:   Active/AA/PROM ROM Right eval Left eval  Shoulder flexion 110.110, 110   Shoulder extension    Shoulder abduction    Shoulder adduction    Shoulder internal rotation    Shoulder external rotation 70   Elbow flexion    Elbow extension    Wrist flexion    Wrist extension    Wrist ulnar deviation    Wrist radial deviation    Wrist pronation    Wrist supination    (Blank rows = not tested)  UPPER EXTREMITY MMT:   Strength testing was only minimally provocative for each motion MMT Right eval Left eval  Shoulder flexion 4   Shoulder extension 4   Shoulder abduction 4   Shoulder adduction 4   Shoulder internal rotation 4   Shoulder external rotation 4   Middle trapezius    Lower trapezius     Elbow flexion    Elbow extension    Wrist flexion    Wrist extension    Wrist ulnar deviation    Wrist radial deviation    Wrist pronation    Wrist  supination    Grip strength (lbs)    (Blank rows = not tested)  SHOULDER SPECIAL TESTS:  Impingement tests: Hawkins/Kennedy impingement test: positive   Rotator cuff assessment: Drop arm test: negative and Empty can test: negative  Biceps assessment: Yergason's test: negative and Speed's test: negative  PALPATION:  TTP of the anterior GH area   TODAY'S TREATMENT:  None provided  PATIENT EDUCATION: Education details: Eval findings, POC Person educated: Patient Education method: Explanation Education comprehension: verbalized understanding  ASSESSMENT:  CLINICAL IMPRESSION: Patient is a 56 y.o. F who was seen today for physical therapy evaluation and treatment for chronic R shoulder pain. Previously, pt underwent a course of PT following right shoulder biceps tenodesis 12/16/2021 with her reaching maximized rehab potential. Pt is to undergo an R subacromial injection on 7/13 and resume PT for the R shoulder. PROM, AAROM, and AROM's for shoulder flexion ere equal at 110d with pt reporting pain at end range of each movement. PROM of ER was 70d. Pt appeared most symptomatic of impingement syndrome.  OBJECTIVE IMPAIRMENTS decreased ROM, decreased strength, impaired flexibility, impaired UE functional use, obesity, and pain.   ACTIVITY LIMITATIONS carrying, lifting, sleeping, bathing, dressing, and reach over head  PARTICIPATION LIMITATIONS: meal prep, cleaning, laundry, driving, and shopping  PERSONAL FACTORS Fitness, Past/current experiences, and Time since onset of injury/illness/exacerbation are also affecting patient's functional outcome.   REHAB POTENTIAL: Fair due to chronicity of R shoulder issue  CLINICAL DECISION MAKING: Stable/uncomplicated  EVALUATION COMPLEXITY: Low   GOALS:  SHORT TERM GOALS= LTGs  LONG TERM  GOALS: Target date: 07/09/22  Pt will report a decrease in average R shoulder pain to 4/10 or less with daily activities  Baseline: 7/10 Goal status: INITIAL  2.  Pt will be able to lift a 2# weight to second shelf x10, not limited by pain Baseline: Limited Goal status: INITIAL  3.  Pt's FOTO score will improve to 61% as indication of improved function Baseline: 41% Goal status: INITIAL  4.  Pt will be Ind in a final HEP to maintain achieved LOF Baseline:  Goal status: INITIAL   PLAN: PT FREQUENCY: 2x/week  PT DURATION: 4 weeks  PLANNED INTERVENTIONS: Therapeutic exercises, Therapeutic activity, Patient/Family education, Joint mobilization, Aquatic Therapy, Dry Needling, Electrical stimulation, Cryotherapy, Moist heat, Taping, Vasopneumatic device, Ultrasound, Ionotophoresis '4mg'$ /ml Dexamethasone, Manual therapy, and Re-evaluation  PLAN FOR NEXT SESSION: Initiate therex and HEP; use of modalities, manual therapy, and TPDN as indicated; Review FOTO.  Lirio Bach MS, PT 05/26/22 6:21 AM

## 2022-05-26 LAB — TROPONIN I (HIGH SENSITIVITY): Troponin I (High Sensitivity): 3 ng/L (ref <18)

## 2022-05-26 MED ORDER — SUCRALFATE 1 GM/10ML PO SUSP: 1.0000 g | Freq: Three times a day (TID) | ORAL | 0 refills | Status: DC | PRN

## 2022-05-26 MED ORDER — ALUM & MAG HYDROXIDE-SIMETH 200-200-20 MG/5ML PO SUSP
30.0000 mL | Freq: Once | ORAL | Status: AC
  Administered 2022-05-26: 30 mL via ORAL
  Filled 2022-05-26: qty 30

## 2022-05-26 MED ORDER — LIDOCAINE VISCOUS HCL 2 % MT SOLN
15.0000 mL | Freq: Once | OROMUCOSAL | Status: AC
  Administered 2022-05-26: 15 mL via ORAL
  Filled 2022-05-26: qty 15

## 2022-05-26 MED ORDER — OMEPRAZOLE 40 MG PO CPDR: 40.0000 mg | DELAYED_RELEASE_CAPSULE | Freq: Every day | ORAL | 0 refills | Status: DC

## 2022-05-26 NOTE — Discharge Instructions (Signed)
You are seen in the ER today for your upper abdominal pain and lower chest pain.  Based on your testing today this does not appear to be related to your heart and your lungs.  Suspect you have what is called gastritis which is inflammation of the stomach with GERD.  You have been prescribed medication to take daily regardless if your symptoms as well as a medication you may drink when you are having more discomfort.  Please follow-up closely with your primary care doctor and return to the ER with any new severe symptoms.

## 2022-05-26 NOTE — ED Provider Notes (Signed)
[facility] EMERGENCY DEPARTMENT Provider Note   CSN: [id number] Arrival date & time: 05/25/22  2213     History {Add pertinent medical, surgical, social history, OB history to HPI:1} Chief Complaint  Patient presents with  . Chest Pain    Meagan Flores is a 56 y.o. female.  HPI     Home Medications Prior to Admission medications   Medication Sig Start Date End Date Taking? Authorizing Provider  acetaminophen-codeine (TYLENOL #3) 300-30 MG tablet Take 1 tablet by mouth 2 (two) times daily as needed for moderate pain. 03/24/22   Aundra Dubin, PA-C  aspirin EC 81 MG tablet Take 1 tablet (81 mg total) by mouth daily. 01/27/22   Elsie Stain, MD  atorvastatin (LIPITOR) 40 MG tablet Take 1 tablet (40 mg total) by mouth daily at 6 PM. 01/27/22   Elsie Stain, MD  clobetasol cream (TEMOVATE) 6.27 % Apply 1 application topically 2 (two) times daily. 01/27/22   Elsie Stain, MD  HYDROcodone-acetaminophen (NORCO) 5-325 MG tablet Take 1 tablet by mouth 3 (three) times daily as needed. Patient taking differently: Take 1 tablet by mouth 3 (three) times daily as needed for moderate pain or severe pain. 12/23/21   Aundra Dubin, PA-C  levothyroxine (EUTHYROX) 50 MCG tablet Take 2 tablets (100 mcg total) by mouth daily before breakfast. 01/27/22   Elsie Stain, MD  metoprolol succinate (TOPROL-XL) 50 MG 24 hr tablet Take 1 tablet (50 mg total) by mouth daily. Take with or immediately following a meal. 01/27/22 04/27/22  Elsie Stain, MD  oxyCODONE-acetaminophen (PERCOCET) 5-325 MG tablet Take 1-2 tablets by mouth every 8 (eight) hours as needed for severe pain. 12/16/21   Leandrew Koyanagi, MD  pregabalin (LYRICA) 150 MG capsule Take 1 capsule (150 mg total) by mouth 2 (two) times daily. 01/27/22   Elsie Stain, MD  valsartan (DIOVAN) 160 MG tablet Take 1 tablet (160 mg total) by mouth daily. 01/27/22   Elsie Stain, MD      Allergies    Sulfa antibiotics     Review of Systems   Review of Systems  Physical Exam Updated Vital Signs BP (!) 123/93 (BP Location: Left Arm)   Pulse (!) 58   Temp 98.2 F (36.8 C) (Oral)   Resp 17   SpO2 98%  Physical Exam  ED Results / Procedures / Treatments   Labs (all labs ordered are listed, but only abnormal results are displayed) Labs Reviewed  COMPREHENSIVE METABOLIC PANEL - Abnormal; Notable for the following components:      Result Value   Glucose, Bld 119 (*)    Calcium 8.4 (*)    All other components within normal limits  CBC WITH DIFFERENTIAL/PLATELET - Abnormal; Notable for the following components:   RBC 5.17 (*)    MCV 76.6 (*)    MCH 24.6 (*)    RDW 16.4 (*)    All other components within normal limits  LIPASE, BLOOD  TROPONIN I (HIGH SENSITIVITY)  TROPONIN I (HIGH SENSITIVITY)    EKG None  Radiology DG Chest 2 View  Result Date: 05/25/2022 CLINICAL DATA:  Chest pain EXAM: CHEST - 2 VIEW COMPARISON:  11/04/2020 FINDINGS: Heart and mediastinal contours are within normal limits. No focal opacities or effusions. No acute bony abnormality. IMPRESSION: No active cardiopulmonary disease. Electronically Signed   By: Rolm Baptise M.D.   On: 05/25/2022 23:10    Procedures Procedures  {Document cardiac monitor,  telemetry assessment procedure when appropriate:1}  Medications Ordered in ED Medications - No data to display  ED Course/ Medical Decision Making/ A&P                           Medical Decision Making  ***  {Document critical care time when appropriate:1} {Document review of labs and clinical decision tools ie heart score, Chads2Vasc2 etc:1}  {Document your independent review of radiology images, and any outside records:1} {Document your discussion with family members, caretakers, and with consultants:1} {Document social determinants of health affecting pt's care:1} {Document your decision making why or why not admission, treatments were needed:1} Final Clinical  Impression(s) / ED Diagnoses Final diagnoses:  None    Rx / DC Orders ED Discharge Orders     None

## 2022-05-26 NOTE — ED Notes (Signed)
Patient verbalizes understanding of d/c instructions. Opportunities for questions and answers were provided. Pt d/c from ED and ambulated to lobby.  

## 2022-05-30 NOTE — Progress Notes (Signed)
Established Patient Office Visit  Subjective:  Patient ID: Meagan Flores, female    DOB: September 18, 1966  Age: 56 y.o. MRN: [id number]  CC:  Chief Complaint  Patient presents with   Hypertension    HPI 01/2022 Meagan Flores presents for primary care follow-up.  On arrival blood pressure is good 133/85.  Patient has undergone surgery of the right biceps tendon of the right shoulder due to tendinosis since I last saw her.  The surgery occurred in January and she is undergoing rehab now.  She still has some pain in the shoulder but is improving.  She has reduced her cigarette content down to 1 pack every week.  She has been visiting with our clinical social worker for counseling on depression and anxiety.  This has been beneficial to her.  She did have a viral type illness in January she did not get COVID tested.  This causes weakness fatigue and blurry vision.  She still has cloudiness to her vision in the left eye.  She does not see an eye doctor.  She is trying to apply for disability but has not yet been improved.  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  Patient has no other real complaints at this time.  She needs refills on all her medications.  She has been compliant with her blood pressure cholesterol and thyroid medicines.  She states the Temovate cream helped her rash on her arms.  7/3 This patient seen in return follow-up and she went to the emergency room on 27 June for abdominal pain and chest pain.  Extensive work-up done in the emergency room was negative.  No evidence of heart attack.  EKG chest x-ray is normal labs are normal.  Patient had mild elevation in blood pressure.  She received a GI cocktail symptoms went away.  She was given prescriptions for Carafate and omeprazole but she did not fill these as these were too expensive for her.  Patient's disability has not gone through  Patient's [name] has not yet been approved  Patient needs  refills on all medications and prefers to use [facility] over our pharmacy.  Patient has upcoming appointments with orthopedics for further injections in the shoulder joint.  On arrival blood pressure is good 133/89.  She needs more cream for her eczema on the forearms.    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   COVID-19 virus infection 02/14/2020   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) tear of right shoulder 09/30/2020   Essential hypertension    Glenohumeral arthritis 12/27/2014   History of stroke    History of TIA (transient ischemic attack) 02/14/2020   Hyperlipidemia    Hypothyroidism    Impingement syndrome of right shoulder 12/24/2016   Mixed hyperlipidemia    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   Prediabetes    Preop cardiovascular exam 11/25/2020   Pulmonary hypertension, unspecified ([place]) 11/25/2020   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   Rotator cuff impingement syndrome of right shoulder 09/30/2020   Skin rash 02/17/2021   Stroke [facility])    Tendinopathy of right biceps tendon 09/30/2020   Thyroid disease    TIA (transient ischemic attack) 04/15/2018   Tobacco use 09/28/2016    Past Surgical History:  Procedure Laterality Date   BICEPT TENODESIS Right 12/16/2021   Procedure: RIGHT BICEPS TENODESIS;  Surgeon:  Xu, Naiping M, MD;  Location: [facility];  Service: Orthopedics;  Laterality: Right;   PARTIAL HYSTERECTOMY     right arm sx     SHOULDER ARTHROSCOPY WITH BICEPSTENOTOMY Right 01/21/2021   Procedure: SHOULDER ARTHROSCOPY WITH BICEPSTENOTOMY;  Surgeon: Xu, Naiping M, MD;  Location: [facility];  Service: Orthopedics;  Laterality: Right;   SHOULDER ARTHROSCOPY WITH DISTAL CLAVICLE RESECTION Right 01/12/2017   Procedure: RIGHT SHOULDER ARTHROSCOPY WITH  SUBACROMIAL DECOMPRESSION, DISTAL CLAVICLE EXCISION,  extensive debridement;  Surgeon: Naiping M Xu, MD;  Location: [facility];  Service: Orthopedics;  Laterality: Right;   THYMECTOMY  2004   THYROIDECTOMY      Family History  Problem Relation Age of Onset   Stomach cancer Sister    Colon polyps Sister    Esophageal cancer Neg Hx    Rectal cancer Neg Hx    Colon cancer Neg Hx    Breast cancer Neg Hx     Social History   Socioeconomic History   Marital status: Married    Spouse name: Not on file   Number of children: Not on file   Years of education: Not on file   Highest education level: Not on file  Occupational History   Not on file  Tobacco Use   Smoking status: Every Day    Packs/day: 0.50    Years: 34.00    Total pack years: 17.00    Types: Cigarettes   Smokeless tobacco: Never  Vaping Use   Vaping Use: Never used  Substance and Sexual Activity   Alcohol use: Yes    Comment: occassional   Drug use: No   Sexual activity: Not on file  Other Topics Concern   Not on file  Social History Narrative   Not on file   Social Determinants of Health   Financial Resource Strain: Not on file  Food Insecurity: Not on file  Transportation Needs: Not on file  Physical Activity: Not on file  Stress: Not on file  Social Connections: Not on file  Intimate Partner Violence: Not on file    Outpatient Medications Prior to Visit  Medication Sig Dispense Refill   acetaminophen-codeine (TYLENOL #3) 300-30 MG tablet Take 1 tablet by mouth 2 (two) times daily as needed for moderate pain. 30 tablet 0   aspirin EC 81 MG tablet Take 1 tablet (81 mg total) by mouth daily. 60 tablet 3   atorvastatin (LIPITOR) 40 MG tablet Take 1 tablet (40 mg total) by mouth daily at 6 PM. 90 tablet 1   HYDROcodone-acetaminophen (NORCO) 5-325 MG tablet Take 1 tablet by mouth 3 (three) times daily as needed. (Patient taking differently: Take 1 tablet by mouth 3 (three) times daily as needed for moderate pain or severe pain.) 20 tablet 0    levothyroxine (EUTHYROX) 50 MCG tablet Take 2 tablets (100 mcg total) by mouth daily before breakfast. 60 tablet 4   oxyCODONE-acetaminophen (PERCOCET) 5-325 MG tablet Take 1-2 tablets by mouth every 8 (eight) hours as needed for severe pain. 30 tablet 0   pregabalin (LYRICA) 150 MG capsule Take 1 capsule (150 mg total) by mouth 2 (two) times daily. 60 capsule 4   sucralfate (CARAFATE) 1 GM/10ML suspension Take 10 mLs (1 g total) by mouth 3 (three) times daily as needed (reflux). 420 mL 0   valsartan (DIOVAN) 160 MG tablet Take 1 tablet (160 mg total) by mouth daily. 60 tablet 4   clobetasol cream (TEMOVATE) 0.05 %   Apply 1 application topically 2 (two) times daily. (Patient not taking: Reported on 05/31/2022) 30 g 3   metoprolol succinate (TOPROL-XL) 50 MG 24 hr tablet Take 1 tablet (50 mg total) by mouth daily. Take with or immediately following a meal. 90 tablet 1   omeprazole (PRILOSEC) 40 MG capsule Take 1 capsule (40 mg total) by mouth daily. (Patient not taking: Reported on 05/31/2022) 30 capsule 0   No facility-administered medications prior to visit.    Allergies  Allergen Reactions   Sulfa Antibiotics Hives    ROS Review of Systems  Constitutional: Negative.   HENT: Negative.  Negative for ear pain, postnasal drip, rhinorrhea, sinus pressure, sore throat, trouble swallowing and voice change.   Eyes:  Negative for visual disturbance.  Respiratory: Negative.  Negative for apnea, cough, choking, chest tightness, shortness of breath, wheezing and stridor.   Cardiovascular: Negative.  Negative for chest pain, palpitations and leg swelling.  Gastrointestinal: Negative.  Negative for abdominal distention, abdominal pain, nausea and vomiting.  Genitourinary: Negative.   Musculoskeletal:  Negative for arthralgias and myalgias.       Right shoulder pain  Skin: Negative.  Negative for rash.  Allergic/Immunologic: Negative.  Negative for environmental allergies and food allergies.   Neurological: Negative.  Negative for dizziness, syncope, weakness and headaches.  Hematological: Negative.  Negative for adenopathy. Does not bruise/bleed easily.  Psychiatric/Behavioral: Negative.  Negative for agitation and sleep disturbance. The patient is not nervous/anxious.       Objective:    Physical Exam Vitals reviewed.  Constitutional:      Appearance: Normal appearance. She is well-developed. She is obese. She is not diaphoretic.  HENT:     Head: Normocephalic and atraumatic.     Nose: Nose normal. No nasal deformity, septal deviation, mucosal edema or rhinorrhea.     Right Sinus: No maxillary sinus tenderness or frontal sinus tenderness.     Left Sinus: No maxillary sinus tenderness or frontal sinus tenderness.     Mouth/Throat:     Mouth: Mucous membranes are moist.     Pharynx: Oropharynx is clear. No oropharyngeal exudate.  Eyes:     General: Lids are normal. Vision grossly intact. Gaze aligned appropriately. No allergic shiner, visual field deficit or scleral icterus.       Right eye: No foreign body, discharge or hordeolum.        Left eye: No foreign body, discharge or hordeolum.     Extraocular Movements: Extraocular movements intact.     Conjunctiva/sclera: Conjunctivae normal.     Right eye: Right conjunctiva is not injected. No chemosis, exudate or hemorrhage.    Left eye: Left conjunctiva is not injected. No chemosis, exudate or hemorrhage.    Pupils: Pupils are equal, round, and reactive to light.  Neck:     Thyroid: No thyromegaly.     Vascular: No carotid bruit or JVD.     Trachea: Trachea normal. No tracheal tenderness or tracheal deviation.  Cardiovascular:     Rate and Rhythm: Normal rate and regular rhythm.     Chest Wall: PMI is not displaced.     Pulses: Normal pulses. No decreased pulses.     Heart sounds: Normal heart sounds, S1 normal and S2 normal. Heart sounds not distant. No murmur heard.    No systolic murmur is present.     No  diastolic murmur is present.     No friction rub. No gallop. No S3 or S4 sounds.  Pulmonary:  Effort: No tachypnea, accessory muscle usage or respiratory distress.     Breath sounds: No stridor. No decreased breath sounds, wheezing, rhonchi or rales.  Chest:     Chest wall: No tenderness.  Abdominal:     General: Bowel sounds are normal. There is no distension.     Palpations: Abdomen is soft. Abdomen is not rigid.     Tenderness: There is no abdominal tenderness. There is no guarding or rebound.  Musculoskeletal:        General: Normal range of motion.     Cervical back: Normal range of motion and neck supple. No edema, erythema or rigidity. No muscular tenderness. Normal range of motion.  Lymphadenopathy:     Head:     Right side of head: No submental or submandibular adenopathy.     Left side of head: No submental or submandibular adenopathy.     Cervical: No cervical adenopathy.  Skin:    General: Skin is warm and dry.     Coloration: Skin is not pale.     Findings: No rash.     Nails: There is no clubbing.  Neurological:     Mental Status: She is alert and oriented to person, place, and time.     Sensory: No sensory deficit.  Psychiatric:        Speech: Speech normal.        Behavior: Behavior normal.     BP 133/89   Pulse 70   Wt 214 lb 3.2 oz (97.2 kg)   SpO2 91%   BMI 32.57 kg/m  Wt Readings from Last 3 Encounters:  05/31/22 214 lb 3.2 oz (97.2 kg)  05/05/22 213 lb (96.6 kg)  04/22/22 210 lb (95.3 kg)     There are no preventive care reminders to display for this patient.  There are no preventive care reminders to display for this patient.  Lab Results  Component Value Date   TSH 6.990 (H) 06/29/2021   Lab Results  Component Value Date   WBC 7.6 05/25/2022   HGB 12.7 05/25/2022   HCT 39.6 05/25/2022   MCV 76.6 (L) 05/25/2022   PLT 216 05/25/2022   Lab Results  Component Value Date   NA 139 05/25/2022   K 3.9 05/25/2022   CO2 23 05/25/2022    GLUCOSE 119 (H) 05/25/2022   BUN 11 05/25/2022   CREATININE 0.78 05/25/2022   BILITOT 0.6 05/25/2022   ALKPHOS 90 05/25/2022   AST 24 05/25/2022   ALT 17 05/25/2022   PROT 6.6 05/25/2022   ALBUMIN 3.7 05/25/2022   CALCIUM 8.4 (L) 05/25/2022   ANIONGAP 10 05/25/2022   EGFR 86 01/27/2022   Lab Results  Component Value Date   CHOL 253 (H) 01/27/2022   Lab Results  Component Value Date   HDL 42 01/27/2022   Lab Results  Component Value Date   LDLCALC 194 (H) 01/27/2022   Lab Results  Component Value Date   TRIG 97 01/27/2022   Lab Results  Component Value Date   CHOLHDL 6.0 (H) 01/27/2022   Lab Results  Component Value Date   HGBA1C 5.8 (A) 10/21/2020      Assessment & Plan:   Problem List Items Addressed This Visit       Cardiovascular and Mediastinum   Essential hypertension    Blood pressure well controlled no change in metoprolol and valsartan continue same      Relevant Medications   atorvastatin (LIPITOR) 40 MG tablet   metoprolol   succinate (TOPROL-XL) 50 MG 24 hr tablet   valsartan (DIOVAN) 160 MG tablet     Endocrine   Postoperative hypothyroidism    Thyroid function previously at baseline continue thyroxine but will switch to 100 mcg daily      Relevant Medications   levothyroxine (EUTHYROX) 100 MCG tablet   metoprolol succinate (TOPROL-XL) 50 MG 24 hr tablet     Musculoskeletal and Integument   Skin rash    Patient with recurrent eczema of the left forearm plan to refill Temovate      Tendinopathy of right biceps tendon    Patient encouraged to keep follow-up with orthopedics        Other   History of stroke   Relevant Orders   Lipid panel   Mixed hyperlipidemia - Primary    Continue cholesterol medication recheck lipid panel      Relevant Medications   atorvastatin (LIPITOR) 40 MG tablet   metoprolol succinate (TOPROL-XL) 50 MG 24 hr tablet   valsartan (DIOVAN) 160 MG tablet   Other Relevant Orders   Lipid panel    Tobacco use    Patient has resumed cigarette use and one pack will last her 3 days. Attributes to increased stress and pain with right shoulder tendenopathy. Recommended nicotine replacement therapy     Current smoking consumption amount: 2 cigarettes daily  Dicsussion on advise to quit smoking and smoking impacts: cv impacts  Patient's willingness to quit:  Wants to quit  Methods to quit smoking discussed:  Nicotine replacment  Medication management of smoking session drugs discussed: nicotine patch    Setting quit date  Not established  Follow-up arranged  Dr Joya Gaskins 5 months,   Time spent counseling the patient:  5 min        History of TIA (transient ischemic attack)   Relevant Medications   atorvastatin (LIPITOR) 40 MG tablet   BMI 33.0-33.9,adult    Elevated BMI patient education regarding diet      Meds ordered this encounter  Medications   atorvastatin (LIPITOR) 40 MG tablet    Sig: Take 1 tablet (40 mg total) by mouth daily at 6 PM.    Dispense:  90 tablet    Refill:  1   clobetasol cream (TEMOVATE) 0.05 %    Sig: Apply 1 Application topically 2 (two) times daily.    Dispense:  30 g    Refill:  3   levothyroxine (EUTHYROX) 100 MCG tablet    Sig: Take 1 tablet (100 mcg total) by mouth daily before breakfast.    Dispense:  90 tablet    Refill:  1   metoprolol succinate (TOPROL-XL) 50 MG 24 hr tablet    Sig: Take 1 tablet (50 mg total) by mouth daily. Take with or immediately following a meal.    Dispense:  90 tablet    Refill:  1   valsartan (DIOVAN) 160 MG tablet    Sig: Take 1 tablet (160 mg total) by mouth daily.    Dispense:  60 tablet    Refill:  4    Follow-up: Return in about 5 months (around 10/31/2022).    Asencion Noble, MD

## 2022-05-31 ENCOUNTER — Encounter: Payer: Self-pay | Admitting: Critical Care Medicine

## 2022-05-31 ENCOUNTER — Ambulatory Visit: Payer: Self-pay | Attending: Critical Care Medicine | Admitting: Critical Care Medicine

## 2022-05-31 VITALS — BP 133/89 | HR 70 | Wt 214.2 lb

## 2022-05-31 DIAGNOSIS — Z8673 Personal history of transient ischemic attack (TIA), and cerebral infarction without residual deficits: Secondary | ICD-10-CM

## 2022-05-31 DIAGNOSIS — E782 Mixed hyperlipidemia: Secondary | ICD-10-CM

## 2022-05-31 DIAGNOSIS — Z720 Tobacco use: Secondary | ICD-10-CM

## 2022-05-31 DIAGNOSIS — I10 Essential (primary) hypertension: Secondary | ICD-10-CM

## 2022-05-31 DIAGNOSIS — M67921 Unspecified disorder of synovium and tendon, right upper arm: Secondary | ICD-10-CM

## 2022-05-31 DIAGNOSIS — E890 Postprocedural hypothyroidism: Secondary | ICD-10-CM

## 2022-05-31 DIAGNOSIS — Z6833 Body mass index (BMI) 33.0-33.9, adult: Secondary | ICD-10-CM

## 2022-05-31 DIAGNOSIS — R21 Rash and other nonspecific skin eruption: Secondary | ICD-10-CM

## 2022-05-31 MED ORDER — VALSARTAN 160 MG PO TABS: 160.0000 mg | ORAL_TABLET | Freq: Every day | ORAL | 4 refills | Status: DC

## 2022-05-31 MED ORDER — METOPROLOL SUCCINATE ER 50 MG PO TB24: 50.0000 mg | ORAL_TABLET | Freq: Every day | ORAL | 1 refills | Status: DC

## 2022-05-31 MED ORDER — CLOBETASOL PROPIONATE 0.05 % EX CREA: 1.0000 | TOPICAL_CREAM | Freq: Two times a day (BID) | CUTANEOUS | 3 refills | Status: DC

## 2022-05-31 MED ORDER — LEVOTHYROXINE SODIUM 100 MCG PO TABS: 100.0000 ug | ORAL_TABLET | Freq: Every day | ORAL | 1 refills | Status: DC

## 2022-05-31 MED ORDER — ATORVASTATIN CALCIUM 40 MG PO TABS: 40.0000 mg | ORAL_TABLET | Freq: Every day | ORAL | 1 refills | Status: DC

## 2022-05-31 NOTE — Assessment & Plan Note (Signed)
Continue cholesterol medication recheck lipid panel

## 2022-05-31 NOTE — Assessment & Plan Note (Signed)
Thyroid function previously at baseline continue thyroxine but will switch to 100 mcg daily

## 2022-05-31 NOTE — Assessment & Plan Note (Signed)
Blood pressure well controlled no change in metoprolol and valsartan continue same

## 2022-05-31 NOTE — Patient Instructions (Signed)
No change in medications however I will change her thyroid pill to 1 pill daily of 100 mcg instead of the 2 pills 50 mcg you been taking  Discontinue Carafate and omeprazole that was ordered by the emergency room did not pick that up  Keep your follow-ups with orthopedics if you need further injections in the right shoulder  Labs today include cholesterol panel  Refills on all medications sent to your [place]  Return to see Dr. Joya Gaskins 5 months

## 2022-05-31 NOTE — Assessment & Plan Note (Signed)
Patient with recurrent eczema of the left forearm plan to refill Temovate

## 2022-05-31 NOTE — Assessment & Plan Note (Signed)
Patient encouraged to keep follow-up with orthopedics

## 2022-05-31 NOTE — Assessment & Plan Note (Signed)
Elevated BMI patient education regarding diet

## 2022-05-31 NOTE — Assessment & Plan Note (Signed)
Patient has resumed cigarette use and one pack will last her 3 days. Attributes to increased stress and pain with right shoulder tendenopathy. Recommended nicotine replacement therapy     . Current smoking consumption amount: 2 cigarettes daily  . Dicsussion on advise to quit smoking and smoking impacts: cv impacts  . Patient's willingness to quit:  Wants to quit  . Methods to quit smoking discussed:  Nicotine replacment  . Medication management of smoking session drugs discussed: nicotine patch    . Setting quit date  Not established  . Follow-up arranged  Dr Joya Gaskins 5 months,   Time spent counseling the patient:  5 min

## 2022-06-01 LAB — LIPID PANEL
Chol/HDL Ratio: 4.3 ratio (ref 0.0–4.4)
Cholesterol, Total: 243 mg/dL — ABNORMAL HIGH (ref 100–199)
HDL: 56 mg/dL (ref >39)
LDL Chol Calc (NIH): 172 mg/dL — ABNORMAL HIGH (ref 0–99)
Triglycerides: 85 mg/dL (ref 0–149)
VLDL Cholesterol Cal: 15 mg/dL (ref 5–40)

## 2022-06-01 NOTE — Progress Notes (Signed)
Cholesterol very high take the atorvastatin daily as prescribed

## 2022-06-02 ENCOUNTER — Telehealth: Payer: Self-pay

## 2022-06-02 NOTE — Telephone Encounter (Signed)
-----   Message from Elsie Stain, MD sent at 06/01/2022  7:18 AM EDT ----- Cholesterol very high take the atorvastatin daily as prescribed

## 2022-06-02 NOTE — Telephone Encounter (Signed)
Pt was called and is aware of results, DOB was confirmed.  ?

## 2022-06-10 ENCOUNTER — Ambulatory Visit: Payer: Self-pay | Admitting: Physical Medicine and Rehabilitation

## 2022-06-10 ENCOUNTER — Ambulatory Visit: Payer: Self-pay

## 2022-06-10 ENCOUNTER — Encounter: Payer: Self-pay | Admitting: Physical Medicine and Rehabilitation

## 2022-06-10 DIAGNOSIS — M25511 Pain in right shoulder: Secondary | ICD-10-CM

## 2022-06-10 DIAGNOSIS — G8929 Other chronic pain: Secondary | ICD-10-CM

## 2022-06-10 NOTE — Progress Notes (Signed)
Pt state right shoulder pain. Pt state lifting her right arm makes the pain worse. Pt state she takes over the counter pain meds and heat to help ease her pain.   Numeric Pain Rating Scale and Functional Assessment Average Pain 0   In the last MONTH (on 0-10 scale) has pain interfered with the following?  1. General activity like being  able to carry out your everyday physical activities such as walking, climbing stairs, carrying groceries, or moving a chair?  Rating(1)  -BT, -Dye Allergies.

## 2022-06-10 NOTE — Progress Notes (Signed)
   Meagan Flores - 56 y.o. female MRN [id number]  Date of birth: September 28, 1966  Office Visit Note: Visit Date: 06/10/2022 PCP: Elsie Stain, MD Referred by: Elsie Stain, MD  Subjective: Chief Complaint  Patient presents with   Right Shoulder - Pain   HPI:  Meagan Flores is a 56 y.o. female who comes in today at the request of Dr. Eduard Roux for planned Right anesthetic glenohumeral arthrogram with fluoroscopic guidance.  The patient has failed conservative care including home exercise, medications, time and activity modification.  This injection will be diagnostic and hopefully therapeutic.  Please see requesting physician notes for further details and justification. Referral placed in April never reached work que as per staff.   ROS Otherwise per HPI.  Assessment & Plan: Visit Diagnoses:    ICD-10-CM   1. Chronic right shoulder pain  M25.511 Large Joint Inj: R glenohumeral   G89.29 XR C-ARM NO REPORT      Plan: No additional findings.   Meds & Orders: No orders of the defined types were placed in this encounter.   Orders Placed This Encounter  Procedures   Large Joint Inj: R glenohumeral   XR C-ARM NO REPORT    Follow-up: Return for visit to requesting provider as needed.   Procedures: Large Joint Inj: R glenohumeral on 06/10/2022 8:13 AM Indications: pain and diagnostic evaluation Details: 22 G 3.5 in needle, fluoroscopy-guided anteromedial approach  Arthrogram: No  Medications: 40 mg triamcinolone acetonide 40 MG/ML; 5 mL bupivacaine 0.25 % Outcome: tolerated well, no immediate complications  There was excellent flow of contrast producing a partial arthrogram of the glenohumeral joint. The patient did have relief of symptoms during the anesthetic phase of the injection. Procedure, treatment alternatives, risks and benefits explained, specific risks discussed. Consent was given by the patient. Immediately prior to procedure a time out was called to verify the  correct patient, procedure, equipment, support staff and site/side marked as required. Patient was prepped and draped in the usual sterile fashion.          Clinical History: No specialty comments available.     Objective:  VS:  HT:    WT:   BMI:     BP:   HR: bpm  TEMP: ( )  RESP:  Physical Exam   Imaging: No results found.

## 2022-06-11 ENCOUNTER — Ambulatory Visit: Payer: Self-pay | Attending: Physician Assistant

## 2022-06-11 DIAGNOSIS — R600 Localized edema: Secondary | ICD-10-CM | POA: Insufficient documentation

## 2022-06-11 DIAGNOSIS — M6281 Muscle weakness (generalized): Secondary | ICD-10-CM | POA: Insufficient documentation

## 2022-06-11 DIAGNOSIS — G8929 Other chronic pain: Secondary | ICD-10-CM | POA: Insufficient documentation

## 2022-06-11 DIAGNOSIS — M25511 Pain in right shoulder: Secondary | ICD-10-CM | POA: Insufficient documentation

## 2022-06-11 DIAGNOSIS — M25611 Stiffness of right shoulder, not elsewhere classified: Secondary | ICD-10-CM | POA: Insufficient documentation

## 2022-06-11 NOTE — Therapy (Addendum)
OUTPATIENT PHYSICAL THERAPY TREATMENT NOTE   Patient Name: Meagan Flores MRN: [id number] DOB:03/11/1966, 56 y.o., female Today's Date: 06/12/2022  PCP: Wright, Patrick E, MD REFERRING PROVIDER: Stanbery, Yarixa L, PA-C  END OF SESSION:   PT End of Session - 06/11/22 2357     Visit Number 2    Number of Visits 9    Date for PT Re-Evaluation 07/09/22    Authorization Type CAFA    PT Start Time 0721    PT Stop Time 0757    PT Time Calculation (min) 36 min    Activity Tolerance Patient tolerated treatment well;Patient limited by pain    Behavior During Therapy WFL for tasks assessed/performed             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   Prediabetes    Preop cardiovascular exam 11/25/2020   Pulmonary hypertension, unspecified (HCC) 11/25/2020   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   Rotator cuff impingement syndrome of right shoulder 09/30/2020   Skin rash 02/17/2021   Stroke (HCC)    Tendinopathy of right biceps tendon 09/30/2020   Thyroid disease    TIA (transient ischemic attack) 04/15/2018   Tobacco use 09/28/2016   Past Surgical History:  Procedure Laterality Date   BICEPT TENODESIS Right 12/16/2021   Procedure: RIGHT BICEPS TENODESIS;  Surgeon: Xu, Naiping M, MD;  Location: [facility];  Service: Orthopedics;  Laterality: Right;   PARTIAL HYSTERECTOMY     right arm sx     SHOULDER ARTHROSCOPY WITH BICEPSTENOTOMY  Right 01/21/2021   Procedure: SHOULDER ARTHROSCOPY WITH BICEPSTENOTOMY;  Surgeon: Xu, Naiping M, MD;  Location: [facility];  Service: Orthopedics;  Laterality: Right;   SHOULDER ARTHROSCOPY WITH DISTAL CLAVICLE RESECTION Right 01/12/2017   Procedure: RIGHT SHOULDER ARTHROSCOPY WITH  SUBACROMIAL DECOMPRESSION, DISTAL CLAVICLE EXCISION, extensive debridement;  Surgeon: Naiping M Xu, MD;  Location: [facility];  Service: Orthopedics;  Laterality: Right;   THYMECTOMY  2004   THYROIDECTOMY     Patient Active Problem List   Diagnosis Date Noted   BMI 33.0-33.9,adult 05/31/2022   Biceps tendonosis of right shoulder 02/10/2022   S/P arthroscopy of right shoulder 02/10/2022   Skin rash 02/17/2021   Pulmonary hypertension, unspecified (HCC) 11/25/2020   Prediabetes    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) tear of right shoulder 09/30/2020   Rotator cuff impingement syndrome of right shoulder 09/30/2020   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   Tendinopathy of right biceps tendon 09/30/2020   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   Aneurysm (HCC) 03/18/2020   History of TIA (transient ischemic attack) 02/14/2020   History of stroke    Mixed hyperlipidemia    Essential hypertension    Impingement syndrome of right shoulder 12/24/2016   Tobacco use 09/28/2016   Glenohumeral arthritis 12/27/2014   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    REFERRING DIAG: Chronic right shoulder pain; Frozen shoulder  THERAPY DIAG:  Chronic right shoulder pain  Stiffness of right   shoulder, not elsewhere classified  Muscle weakness (generalized)  Rationale for Evaluation and Treatment Rehabilitation   SUBJECTIVE:                                                                                                                                                                                     SUBJECTIVE STATEMENT: Pt reports receiving an injection  yesterday and and she had an initial increase in pain, 10/10. Pt reports taking pain medication last night to help her sleep. Pt states Dr. Newton said it may take a couple of days for the pain to improve.   PERTINENT HISTORY: Hx of debridement and sub acromial decompression x2 (2018 and 2022). Hx of stroke, prediabetes   PAIN:  Are you having pain? Yes: NPRS scale: 7/10 Pain location: Whole R shoulder, more in the area Ant R shoulder Pain description: ache, constant Aggravating factors: using arm to press up from a chiar, turning the R arm out Relieving factors: Pain medications   PRECAUTIONS:  None   WEIGHT BEARING RESTRICTIONS No   FALLS:  Has patient fallen in last 6 months? No   LIVING ENVIRONMENT: Lives with: lives with their family Lives in: House/apartment No issue with accessing or mobility within home   OCCUPATION: Not working. Has applied fro disability   PLOF: Independent   PATIENT GOALS Fot my arm to get better, less pain   OBJECTIVE: (objective measures completed at initial evaluation unless otherwise dated)   DIAGNOSTIC FINDINGS:  NA   PATIENT SURVEYS:  FOTO 41% perceived function; 61% predicted   COGNITION:           Overall cognitive status: Within functional limits for tasks assessed                                  SENSATION: WFL   POSTURE: Forward head, rounded shoulders   UPPER EXTREMITY ROM:    Active/AA/PROM ROM Right eval Rt 06/11/22  Shoulder flexion 110.110, 110 130 AAROM c table top   Shoulder extension      Shoulder abduction      Shoulder adduction      Shoulder internal rotation      Shoulder external rotation 70    Elbow flexion      Elbow extension      Wrist flexion      Wrist extension      Wrist ulnar deviation      Wrist radial deviation      Wrist pronation      Wrist supination      (Blank rows = not tested)     UPPER EXTREMITY MMT:                       Strength testing was only minimally provocative for each  motion MMT Right eval Left eval  Shoulder flexion 4    Shoulder extension 4    Shoulder abduction 4    Shoulder adduction 4    Shoulder internal rotation 4    Shoulder external rotation 4    Middle trapezius      Lower trapezius      Elbow flexion      Elbow extension      Wrist flexion      Wrist extension      Wrist ulnar deviation      Wrist radial deviation      Wrist pronation      Wrist supination      Grip strength (lbs)      (Blank rows = not tested)   SHOULDER SPECIAL TESTS:            Impingement tests: Hawkins/Kennedy impingement test: positive             Rotator cuff assessment: Drop arm test: negative and Empty can test: negative            Biceps assessment: Yergason's test: negative and Speed's test: negative   PALPATION:  TTP of the anterior GH area             TODAY'S TREATMENT:  [facility] Adult PT Treatment:                                                DATE: 06/11/22 Therapeutic Exercise: Pulley for shoulder flexion 2 mins as tolerated Table top shoulder flexion x10 Table top shoulder ER x10 Shoulder isometrics as tolerated for flex, ext, abd, ER x5 5"   PATIENT EDUCATION: Education details: Eval findings, POC Person educated: Patient Education method: Explanation Education comprehension: verbalized understanding  Home Program: Access Code: [email or web address] URL: [email or web address] Date: 06/11/2022 Prepared by: Wenona [place]  Exercises - Seated Shoulder Flexion Towel Slide at Table Top Full Range of Motion  - 1 x daily - 7 x weekly - 1 sets - 10 reps - 3 hold - Seated Shoulder External Rotation PROM on Table  - 1 x daily - 7 x weekly - 1 sets - 10 reps - 3 hold - Isometric Shoulder Flexion at Wall  - 1 x daily - 7 x weekly - 1 sets - 5 reps - 5 hold - Isometric Shoulder Extension at Wall  - 1 x daily - 7 x weekly - 1 sets - 5 reps - 5 hold - Isometric Shoulder Abduction at Wall  - 1 x daily - 7 x weekly - 1 sets - 5 reps - 5 hold -  Standing Isometric Shoulder External Rotation with Doorway  - 1 x daily - 7 x weekly - 1 sets - 5 reps - 5 hold     ASSESSMENT:   CLINICAL IMPRESSION: Pt presents to PT following having an injection of her R shoulder yesterday. PT was completed for light ROM and strengthening therex. AAROM of the R shoulder was increased today vs the eval. Pt tolerated the session without increase in R shoulder pain and pt completed the therex properly. Therex were added to the pt's HEP.   OBJECTIVE IMPAIRMENTS decreased   ROM, decreased strength, impaired flexibility, impaired UE functional use, obesity, and pain.    ACTIVITY LIMITATIONS carrying, lifting, sleeping, bathing, dressing, and reach over head   PARTICIPATION LIMITATIONS: meal prep, cleaning, laundry, driving, and shopping   PERSONAL FACTORS Fitness, Past/current experiences, and Time since onset of injury/illness/exacerbation are also affecting patient's functional outcome.    REHAB POTENTIAL: Fair due to chronicity of R shoulder issue   CLINICAL DECISION MAKING: Stable/uncomplicated   EVALUATION COMPLEXITY: Low     GOALS:   SHORT TERM GOALS= LTGs   LONG TERM GOALS: Target date: 07/09/22   Pt will report a decrease in average R shoulder pain to 4/10 or less with daily activities  Baseline: 7/10 Goal status: INITIAL   2.  Pt will be able to lift a 2# weight to second shelf x10, not limited by pain Baseline: Limited Goal status: INITIAL   3.  Pt's FOTO score will improve to 61% as indication of improved function Baseline: 41% Goal status: INITIAL   4.  Pt will be Ind in a final HEP to maintain achieved LOF Baseline:  Goal status: INITIAL     PLAN: PT FREQUENCY: 2x/week   PT DURATION: 4 weeks   PLANNED INTERVENTIONS: Therapeutic exercises, Therapeutic activity, Patient/Family education, Joint mobilization, Aquatic Therapy, Dry Needling, Electrical stimulation, Cryotherapy, Moist heat, Taping, Vasopneumatic device,  Ultrasound, Ionotophoresis 4mg/ml Dexamethasone, Manual therapy, and Re-evaluation   PLAN FOR NEXT SESSION: Initiate therex and HEP; use of modalities, manual therapy, and TPDN as indicated; Review FOTO  Jaze Rodino MS, PT 06/12/22 11:35 AM         

## 2022-06-15 ENCOUNTER — Ambulatory Visit: Payer: Self-pay

## 2022-06-15 DIAGNOSIS — M25611 Stiffness of right shoulder, not elsewhere classified: Secondary | ICD-10-CM

## 2022-06-15 DIAGNOSIS — M6281 Muscle weakness (generalized): Secondary | ICD-10-CM

## 2022-06-15 DIAGNOSIS — G8929 Other chronic pain: Secondary | ICD-10-CM

## 2022-06-15 NOTE — Therapy (Signed)
OUTPATIENT PHYSICAL THERAPY TREATMENT NOTE   Patient Name: Meagan Flores MRN: [id number] DOB:October 28, 1966, 56 y.o., female Today's Date: 06/15/2022  PCP: Elsie Stain, MD REFERRING PROVIDER: Aundra Dubin, PA-C  END OF SESSION:   PT End of Session - 06/15/22 0723     Visit Number 3    Number of Visits 9    Date for PT Re-Evaluation 07/09/22    Authorization Type CAFA    PT Start Time 0718    PT Stop Time 0759    PT Time Calculation (min) 41 min    Activity Tolerance Patient tolerated treatment well;Patient limited by pain    Behavior During Therapy [facility] for tasks assessed/performed              Past Medical History:  Diagnosis Date   Acute pain of left shoulder 09/08/2017   Aneurysm ([place]) 03/18/2020   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   COVID-19 virus infection 02/14/2020   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) tear of right shoulder 09/30/2020   Essential hypertension    Glenohumeral arthritis 12/27/2014   History of stroke    History of TIA (transient ischemic attack) 02/14/2020   Hyperlipidemia    Hypothyroidism    Impingement syndrome of right shoulder 12/24/2016   Mixed hyperlipidemia    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   Prediabetes    Preop cardiovascular exam 11/25/2020   Pulmonary hypertension, unspecified ([place]) 11/25/2020   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   Rotator cuff impingement syndrome of right shoulder 09/30/2020   Skin rash 02/17/2021   Stroke [facility])    Tendinopathy of right biceps tendon 09/30/2020   Thyroid disease    TIA (transient ischemic attack) 04/15/2018   Tobacco use 09/28/2016   Past Surgical History:  Procedure Laterality Date   BICEPT TENODESIS Right 12/16/2021   Procedure: RIGHT BICEPS TENODESIS;  Surgeon: Leandrew Koyanagi, MD;  Location: [place];  Service: Orthopedics;  Laterality: Right;   PARTIAL HYSTERECTOMY     right arm sx     SHOULDER ARTHROSCOPY WITH BICEPSTENOTOMY  Right 01/21/2021   Procedure: SHOULDER ARTHROSCOPY WITH BICEPSTENOTOMY;  Surgeon: Leandrew Koyanagi, MD;  Location: [place];  Service: Orthopedics;  Laterality: Right;   SHOULDER ARTHROSCOPY WITH DISTAL CLAVICLE RESECTION Right 01/12/2017   Procedure: RIGHT SHOULDER ARTHROSCOPY WITH  SUBACROMIAL DECOMPRESSION, DISTAL CLAVICLE EXCISION, extensive debridement;  Surgeon: Leandrew Koyanagi, MD;  Location: [place];  Service: Orthopedics;  Laterality: Right;   THYMECTOMY  2004   THYROIDECTOMY     Patient Active Problem List   Diagnosis Date Noted   BMI 33.0-33.9,adult 05/31/2022   Biceps tendonosis of right shoulder 02/10/2022   S/P arthroscopy of right shoulder 02/10/2022   Skin rash 02/17/2021   Pulmonary hypertension, unspecified ([place]) 11/25/2020   Prediabetes    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) tear of right shoulder 09/30/2020   Rotator cuff impingement syndrome of right shoulder 09/30/2020   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   Tendinopathy of right biceps tendon 09/30/2020   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   Aneurysm ([place]) 03/18/2020   History of TIA (transient ischemic attack) 02/14/2020   History of stroke    Mixed hyperlipidemia    Essential hypertension    Impingement syndrome of right shoulder 12/24/2016   Tobacco use 09/28/2016   Glenohumeral arthritis 12/27/2014   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    REFERRING DIAG: Chronic right shoulder pain; Frozen shoulder  THERAPY DIAG:  Chronic right shoulder pain  Stiffness of  right shoulder, not elsewhere classified  Muscle weakness (generalized)  Rationale for Evaluation and Treatment Rehabilitation   SUBJECTIVE:                                                                                                                                                                                     SUBJECTIVE STATEMENT: Pt reports her R shoulder is about  the same.   PERTINENT HISTORY: Hx of debridement and sub acromial decompression x2 (2018 and 2022). Hx of stroke, prediabetes   PAIN:  Are you having pain? Yes: NPRS scale: 6-7/10 Pain location: Whole R shoulder, more in the area Ant R shoulder Pain description: ache, constant Aggravating factors: using arm to press up from a chiar, turning the R arm out Relieving factors: Pain medications   PRECAUTIONS:  None   WEIGHT BEARING RESTRICTIONS No   FALLS:  Has patient fallen in last 6 months? No   LIVING ENVIRONMENT: Lives with: lives with their family Lives in: House/apartment No issue with accessing or mobility within home   OCCUPATION: Not working. Has applied fro disability   PLOF: Independent   PATIENT GOALS Fot my arm to get better, less pain   OBJECTIVE: (objective measures completed at initial evaluation unless otherwise dated)   DIAGNOSTIC FINDINGS:  NA   PATIENT SURVEYS:  FOTO 41% perceived function; 61% predicted   COGNITION:           Overall cognitive status: Within functional limits for tasks assessed                                  SENSATION: WFL   POSTURE: Forward head, rounded shoulders   UPPER EXTREMITY ROM:    Active/AA/PROM ROM Right eval Rt 06/11/22  Shoulder flexion 110.110, 110 130 AAROM c table top   Shoulder extension      Shoulder abduction      Shoulder adduction      Shoulder internal rotation      Shoulder external rotation 70    Elbow flexion      Elbow extension      Wrist flexion      Wrist extension      Wrist ulnar deviation      Wrist radial deviation      Wrist pronation      Wrist supination      (Blank rows = not tested)   UPPER EXTREMITY MMT:                       Strength testing was only minimally provocative for each motion  MMT Right eval Left eval  Shoulder flexion 4    Shoulder extension 4    Shoulder abduction 4    Shoulder adduction 4    Shoulder internal rotation 4    Shoulder external rotation 4     Middle trapezius      Lower trapezius      Elbow flexion      Elbow extension      Wrist flexion      Wrist extension      Wrist ulnar deviation      Wrist radial deviation      Wrist pronation      Wrist supination      Grip strength (lbs)      (Blank rows = not tested)   SHOULDER SPECIAL TESTS:            Impingement tests: Hawkins/Kennedy impingement test: positive             Rotator cuff assessment: Drop arm test: negative and Empty can test: negative            Biceps assessment: Yergason's test: negative and Speed's test: negative   PALPATION:  TTP of the anterior GH area             TODAY'S TREATMENT:  [facility] Adult PT Treatment:                                                DATE: 06/15/22 Therapeutic Exercise: Pulley for shoulder flexion 2 mins as tolerated Table top shoulder flexion x10 Table top shoulder ER x10 Shoulder row 2x10 RTB Shoulder isometrics as tolerated for flex, ext, abd, ER x5 5" Manual Therapy: STM to the [facility] jt and upper shoulder musculature and MTPR applied to the upper trap. CFM was completed to the ant [facility] jt  Self Care: Pt was instructed in and completed  CFM to the ant [place] jt. Pt completed correctly.  [place] Adult PT Treatment:                                                DATE: 06/11/22 Therapeutic Exercise: Pulley for shoulder flexion 2 mins as tolerated Table top shoulder flexion x10 Table top shoulder ER x10 Shoulder isometrics as tolerated for flex, ext, abd, ER x5 5"   PATIENT EDUCATION: Education details: Eval findings, POC Person educated: Patient Education method: Explanation Education comprehension: verbalized understanding  Home Program: Access Code: [email or web address] URL: [email or web address] Date: 06/11/2022 Prepared by: Gar Ponto  Exercises - Seated Shoulder Flexion Towel Slide at Table Top Full Range of Motion  - 1 x daily - 7 x weekly - 1 sets - 10 reps - 3 hold - Seated Shoulder External Rotation PROM on  Table  - 1 x daily - 7 x weekly - 1 sets - 10 reps - 3 hold - Isometric Shoulder Flexion at Wall  - 1 x daily - 7 x weekly - 1 sets - 5 reps - 5 hold - Isometric Shoulder Extension at Wall  - 1 x daily - 7 x weekly - 1 sets - 5 reps - 5 hold - Isometric Shoulder Abduction at Wall  - 1 x daily - 7 x weekly - 1 sets - 5  reps - 5 hold - Standing Isometric Shoulder External Rotation with Doorway  - 1 x daily - 7 x weekly - 1 sets - 5 reps - 5 hold     ASSESSMENT:   CLINICAL IMPRESSION: PT was completed for R shoulder AAROM, peri-scapular strengthening, and isometric rotator cuff strengthening. Additionally, manual therapy STM and MTPR were provided to the R [place] jt and upper shoulder as well as CFM to the ant [place] jt. Pt also completed CFM and is going to try and completed 2 to 3x a day at home. Pt tolerated he session without adverse effects.   OBJECTIVE IMPAIRMENTS decreased ROM, decreased strength, impaired flexibility, impaired UE functional use, obesity, and pain.    ACTIVITY LIMITATIONS carrying, lifting, sleeping, bathing, dressing, and reach over head   PARTICIPATION LIMITATIONS: meal prep, cleaning, laundry, driving, and shopping   PERSONAL FACTORS Fitness, Past/current experiences, and Time since onset of injury/illness/exacerbation are also affecting patient's functional outcome.    REHAB POTENTIAL: Fair due to chronicity of R shoulder issue   CLINICAL DECISION MAKING: Stable/uncomplicated   EVALUATION COMPLEXITY: Low     GOALS:   SHORT TERM GOALS= LTGs   LONG TERM GOALS: Target date: 07/09/22   Pt will report a decrease in average R shoulder pain to 4/10 or less with daily activities  Baseline: 7/10 Goal status: INITIAL   2.  Pt will be able to lift a 2# weight to second shelf x10, not limited by pain Baseline: Limited Goal status: INITIAL   3.  Pt's FOTO score will improve to 61% as indication of improved function Baseline: 41% Goal status: INITIAL   4.  Pt will be  Ind in a final HEP to maintain achieved LOF Baseline:  Goal status: INITIAL     PLAN: PT FREQUENCY: 2x/week   PT DURATION: 4 weeks   PLANNED INTERVENTIONS: Therapeutic exercises, Therapeutic activity, Patient/Family education, Joint mobilization, Aquatic Therapy, Dry Needling, Electrical stimulation, Cryotherapy, Moist heat, Taping, Vasopneumatic device, Ultrasound, Ionotophoresis '4mg'$ /ml Dexamethasone, Manual therapy, and Re-evaluation   PLAN FOR NEXT SESSION: Initiate therex and HEP; use of modalities, manual therapy, and TPDN as indicated; Review Saintclair Halsted MS, PT 06/15/22 11:39 AM

## 2022-06-17 ENCOUNTER — Ambulatory Visit: Payer: Self-pay

## 2022-06-17 DIAGNOSIS — M25611 Stiffness of right shoulder, not elsewhere classified: Secondary | ICD-10-CM

## 2022-06-17 DIAGNOSIS — G8929 Other chronic pain: Secondary | ICD-10-CM

## 2022-06-17 DIAGNOSIS — M6281 Muscle weakness (generalized): Secondary | ICD-10-CM

## 2022-06-17 NOTE — Therapy (Signed)
OUTPATIENT PHYSICAL THERAPY TREATMENT NOTE   Patient Name: Meagan Flores MRN: [id number] DOB:January 13, 1966, 56 y.o., female Today's Date: 06/17/2022  PCP: Elsie Stain, MD REFERRING PROVIDER: Aundra Dubin, PA-C  END OF SESSION:   PT End of Session - 06/17/22 0736     Visit Number 4    Number of Visits 9    Date for PT Re-Evaluation 07/09/22    Authorization Type CAFA    PT Start Time 0717    PT Stop Time 0800    PT Time Calculation (min) 43 min    Activity Tolerance Patient tolerated treatment well;Patient limited by pain    Behavior During Therapy [facility] for tasks assessed/performed               Past Medical History:  Diagnosis Date   Acute pain of left shoulder 09/08/2017   Aneurysm ([place]) 03/18/2020   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   COVID-19 virus infection 02/14/2020   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) tear of right shoulder 09/30/2020   Essential hypertension    Glenohumeral arthritis 12/27/2014   History of stroke    History of TIA (transient ischemic attack) 02/14/2020   Hyperlipidemia    Hypothyroidism    Impingement syndrome of right shoulder 12/24/2016   Mixed hyperlipidemia    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   Prediabetes    Preop cardiovascular exam 11/25/2020   Pulmonary hypertension, unspecified ([place]) 11/25/2020   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   Rotator cuff impingement syndrome of right shoulder 09/30/2020   Skin rash 02/17/2021   Stroke [facility])    Tendinopathy of right biceps tendon 09/30/2020   Thyroid disease    TIA (transient ischemic attack) 04/15/2018   Tobacco use 09/28/2016   Past Surgical History:  Procedure Laterality Date   BICEPT TENODESIS Right 12/16/2021   Procedure: RIGHT BICEPS TENODESIS;  Surgeon: Leandrew Koyanagi, MD;  Location: [place];  Service: Orthopedics;  Laterality: Right;   PARTIAL HYSTERECTOMY     right arm sx     SHOULDER ARTHROSCOPY WITH  BICEPSTENOTOMY Right 01/21/2021   Procedure: SHOULDER ARTHROSCOPY WITH BICEPSTENOTOMY;  Surgeon: Leandrew Koyanagi, MD;  Location: [place];  Service: Orthopedics;  Laterality: Right;   SHOULDER ARTHROSCOPY WITH DISTAL CLAVICLE RESECTION Right 01/12/2017   Procedure: RIGHT SHOULDER ARTHROSCOPY WITH  SUBACROMIAL DECOMPRESSION, DISTAL CLAVICLE EXCISION, extensive debridement;  Surgeon: Leandrew Koyanagi, MD;  Location: [place];  Service: Orthopedics;  Laterality: Right;   THYMECTOMY  2004   THYROIDECTOMY     Patient Active Problem List   Diagnosis Date Noted   BMI 33.0-33.9,adult 05/31/2022   Biceps tendonosis of right shoulder 02/10/2022   S/P arthroscopy of right shoulder 02/10/2022   Skin rash 02/17/2021   Pulmonary hypertension, unspecified ([place]) 11/25/2020   Prediabetes    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) tear of right shoulder 09/30/2020   Rotator cuff impingement syndrome of right shoulder 09/30/2020   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   Tendinopathy of right biceps tendon 09/30/2020   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   Aneurysm ([place]) 03/18/2020   History of TIA (transient ischemic attack) 02/14/2020   History of stroke    Mixed hyperlipidemia    Essential hypertension    Impingement syndrome of right shoulder 12/24/2016   Tobacco use 09/28/2016   Glenohumeral arthritis 12/27/2014   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    REFERRING DIAG: Chronic right shoulder pain; Frozen shoulder  THERAPY DIAG:  Chronic right shoulder pain  Stiffness  of right shoulder, not elsewhere classified  Muscle weakness (generalized)  Rationale for Evaluation and Treatment Rehabilitation   SUBJECTIVE:                                                                                                                                                                                     SUBJECTIVE STATEMENT: Pt reports her R  shoulder is constant and increases c movement. Pt likes the CFM to the R ant  shoulder   PERTINENT HISTORY: Hx of debridement and sub acromial decompression x2 (2018 and 2022). Hx of stroke, prediabetes   PAIN:  Are you having pain? Yes: NPRS scale: 6-7/10 Pain location: Whole R shoulder, more in the area Ant R shoulder Pain description: ache, constant Aggravating factors: using arm to press up from a chiar, turning the R arm out Relieving factors: Pain medications   PRECAUTIONS:  None   WEIGHT BEARING RESTRICTIONS No   FALLS:  Has patient fallen in last 6 months? No   LIVING ENVIRONMENT: Lives with: lives with their family Lives in: House/apartment No issue with accessing or mobility within home   OCCUPATION: Not working. Has applied fro disability   PLOF: Independent   PATIENT GOALS Fot my arm to get better, less pain   OBJECTIVE: (objective measures completed at initial evaluation unless otherwise dated)   DIAGNOSTIC FINDINGS:  NA   PATIENT SURVEYS:  FOTO 41% perceived function; 61% predicted   COGNITION:           Overall cognitive status: Within functional limits for tasks assessed                                  SENSATION: WFL   POSTURE: Forward head, rounded shoulders   UPPER EXTREMITY ROM:    Active/AA/PROM ROM Right eval Rt 06/11/22 Rt 7/20  Shoulder flexion 110.110, 110 130 AAROM c table top  110d  Shoulder extension       Shoulder abduction       Shoulder adduction       Shoulder internal rotation       Shoulder external rotation 70     Elbow flexion       Elbow extension       Wrist flexion       Wrist extension       Wrist ulnar deviation       Wrist radial deviation       Wrist pronation       Wrist supination       (Blank rows = not tested)   UPPER EXTREMITY MMT:  Strength testing was only minimally provocative for each motion MMT Right eval Left eval  Shoulder flexion 4    Shoulder extension 4     Shoulder abduction 4    Shoulder adduction 4    Shoulder internal rotation 4    Shoulder external rotation 4    Middle trapezius      Lower trapezius      Elbow flexion      Elbow extension      Wrist flexion      Wrist extension      Wrist ulnar deviation      Wrist radial deviation      Wrist pronation      Wrist supination      Grip strength (lbs)      (Blank rows = not tested)   SHOULDER SPECIAL TESTS:            Impingement tests: Hawkins/Kennedy impingement test: positive             Rotator cuff assessment: Drop arm test: negative and Empty can test: negative            Biceps assessment: Yergason's test: negative and Speed's test: negative   PALPATION:  TTP of the anterior GH area             TODAY'S TREATMENT:  [facility] Adult PT Treatment:                                                DATE: 06/17/22 Therapeutic Exercise: Pulley for shoulder flexion 2 mins as tolerated R upper trap stretch c R lateral trunk lean x3 30" 60d doorway stretch x2 20" Wall shoulder flexion x10 Table top shoulder ER x10 Shoulder row 2x10 RTB Shoulder isometrics as tolerated for flex, ext, abd, ER x5 5" Manual Therapy: STM to the [facility] jt and upper shoulder musculature and MTPR applied to the upper trap TrPs. CFM was completed to the ant [place] Adult PT Treatment:                                                DATE: 06/15/22 Therapeutic Exercise: Pulley for shoulder flexion 2 mins as tolerated Table top shoulder flexion x10 Table top shoulder ER x10 Shoulder row 2x10 RTB Shoulder isometrics as tolerated for flex, ext, abd, ER x5 5" Manual Therapy: STM to the [facility] jt and upper shoulder musculature and MTPR applied to the upper trap. CFM was completed to the ant [facility] jt  Self Care: Pt was instructed in and completed  CFM to the ant [place] jt. Pt completed correctly.  [place] Adult PT Treatment:                                                DATE: 06/11/22 Therapeutic Exercise: Pulley for  shoulder flexion 2 mins as tolerated Table top shoulder flexion x10 Table top shoulder ER x10 Shoulder isometrics as tolerated for flex, ext, abd, ER x5 5"   PATIENT EDUCATION: Education details: Eval findings, POC Person educated: Patient Education method: Explanation Education comprehension: verbalized understanding  Home Program: Access Code: [email or web address] URL: [email or web address] Date: 06/11/2022 Prepared by: Gar Ponto  Exercises - Seated Shoulder Flexion Towel Slide at Table Top Full Range of Motion  - 1 x daily - 7 x weekly - 1 sets - 10 reps - 3 hold - Seated Shoulder External Rotation PROM on Table  - 1 x daily - 7 x weekly - 1 sets - 10 reps - 3 hold - Isometric Shoulder Flexion at Wall  - 1 x daily - 7 x weekly - 1 sets - 5 reps - 5 hold - Isometric Shoulder Extension at Wall  - 1 x daily - 7 x weekly - 1 sets - 5 reps - 5 hold - Isometric Shoulder Abduction at Wall  - 1 x daily - 7 x weekly - 1 sets - 5 reps - 5 hold - Standing Isometric Shoulder External Rotation with Doorway  - 1 x daily - 7 x weekly - 1 sets - 5 reps - 5 hold     ASSESSMENT:   CLINICAL IMPRESSION: Pt tolerated PT without adverse effects. PT was provided for ROM and peri-scapular and rotator cuff strengthening. Pain level has not improved overall. AAROM is better, while AROM is limited by pain. Will continue PT to address pain, ROM, and strength to optimize R shoulder UE function.   OBJECTIVE IMPAIRMENTS decreased ROM, decreased strength, impaired flexibility, impaired UE functional use, obesity, and pain.    ACTIVITY LIMITATIONS carrying, lifting, sleeping, bathing, dressing, and reach over head   PARTICIPATION LIMITATIONS: meal prep, cleaning, laundry, driving, and shopping   PERSONAL FACTORS Fitness, Past/current experiences, and Time since onset of injury/illness/exacerbation are also affecting patient's functional outcome.    REHAB POTENTIAL: Fair due to chronicity of R shoulder  issue   CLINICAL DECISION MAKING: Stable/uncomplicated   EVALUATION COMPLEXITY: Low     GOALS:   SHORT TERM GOALS= LTGs   LONG TERM GOALS: Target date: 07/09/22   Pt will report a decrease in average R shoulder pain to 4/10 or less with daily activities  Baseline: 7/10 Goal status: INITIAL   2.  Pt will be able to lift a 2# weight to second shelf x10, not limited by pain Baseline: Limited Goal status: INITIAL   3.  Pt's FOTO score will improve to 61% as indication of improved function Baseline: 41% Goal status: INITIAL   4.  Pt will be Ind in a final HEP to maintain achieved LOF Baseline:  Goal status: INITIAL     PLAN: PT FREQUENCY: 2x/week   PT DURATION: 4 weeks   PLANNED INTERVENTIONS: Therapeutic exercises, Therapeutic activity, Patient/Family education, Joint mobilization, Aquatic Therapy, Dry Needling, Electrical stimulation, Cryotherapy, Moist heat, Taping, Vasopneumatic device, Ultrasound, Ionotophoresis '4mg'$ /ml Dexamethasone, Manual therapy, and Re-evaluation   PLAN FOR NEXT SESSION: Initiate therex and HEP; use of modalities, manual therapy, and TPDN as indicated; Review FOTO. Consider TPDN  Gar Ponto MS, PT 06/17/22 8:01 AM

## 2022-06-25 ENCOUNTER — Encounter: Payer: Self-pay | Admitting: Physical Therapy

## 2022-06-25 ENCOUNTER — Ambulatory Visit: Payer: Self-pay | Admitting: Physical Therapy

## 2022-06-25 DIAGNOSIS — M25611 Stiffness of right shoulder, not elsewhere classified: Secondary | ICD-10-CM

## 2022-06-25 DIAGNOSIS — R600 Localized edema: Secondary | ICD-10-CM

## 2022-06-25 DIAGNOSIS — G8929 Other chronic pain: Secondary | ICD-10-CM

## 2022-06-25 DIAGNOSIS — M6281 Muscle weakness (generalized): Secondary | ICD-10-CM

## 2022-06-25 NOTE — Therapy (Signed)
OUTPATIENT PHYSICAL THERAPY TREATMENT NOTE   Patient Name: Meagan Flores MRN: [id number] DOB:06/30/1966, 56 y.o., female Today's Date: 06/25/2022  PCP: Elsie Stain, MD REFERRING PROVIDER: Aundra Dubin, PA-C  END OF SESSION:   PT End of Session - 06/25/22 0701     Visit Number 5    Number of Visits 9    Date for PT Re-Evaluation 07/09/22    Authorization Type CAFA    PT Start Time 0701    PT Stop Time 6712    PT Time Calculation (min) 41 min    Activity Tolerance Patient tolerated treatment well;Patient limited by pain    Behavior During Therapy [facility] for tasks assessed/performed                Past Medical History:  Diagnosis Date   Acute pain of left shoulder 09/08/2017   Aneurysm ([place]) 03/18/2020   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   COVID-19 virus infection 02/14/2020   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) tear of right shoulder 09/30/2020   Essential hypertension    Glenohumeral arthritis 12/27/2014   History of stroke    History of TIA (transient ischemic attack) 02/14/2020   Hyperlipidemia    Hypothyroidism    Impingement syndrome of right shoulder 12/24/2016   Mixed hyperlipidemia    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   Prediabetes    Preop cardiovascular exam 11/25/2020   Pulmonary hypertension, unspecified ([place]) 11/25/2020   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   Rotator cuff impingement syndrome of right shoulder 09/30/2020   Skin rash 02/17/2021   Stroke [facility])    Tendinopathy of right biceps tendon 09/30/2020   Thyroid disease    TIA (transient ischemic attack) 04/15/2018   Tobacco use 09/28/2016   Past Surgical History:  Procedure Laterality Date   BICEPT TENODESIS Right 12/16/2021   Procedure: RIGHT BICEPS TENODESIS;  Surgeon: Leandrew Koyanagi, MD;  Location: [place];  Service: Orthopedics;  Laterality: Right;   PARTIAL HYSTERECTOMY     right arm sx     SHOULDER ARTHROSCOPY WITH  BICEPSTENOTOMY Right 01/21/2021   Procedure: SHOULDER ARTHROSCOPY WITH BICEPSTENOTOMY;  Surgeon: Leandrew Koyanagi, MD;  Location: [place];  Service: Orthopedics;  Laterality: Right;   SHOULDER ARTHROSCOPY WITH DISTAL CLAVICLE RESECTION Right 01/12/2017   Procedure: RIGHT SHOULDER ARTHROSCOPY WITH  SUBACROMIAL DECOMPRESSION, DISTAL CLAVICLE EXCISION, extensive debridement;  Surgeon: Leandrew Koyanagi, MD;  Location: [place];  Service: Orthopedics;  Laterality: Right;   THYMECTOMY  2004   THYROIDECTOMY     Patient Active Problem List   Diagnosis Date Noted   BMI 33.0-33.9,adult 05/31/2022   Biceps tendonosis of right shoulder 02/10/2022   S/P arthroscopy of right shoulder 02/10/2022   Skin rash 02/17/2021   Pulmonary hypertension, unspecified ([place]) 11/25/2020   Prediabetes    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) tear of right shoulder 09/30/2020   Rotator cuff impingement syndrome of right shoulder 09/30/2020   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   Tendinopathy of right biceps tendon 09/30/2020   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   Aneurysm ([place]) 03/18/2020   History of TIA (transient ischemic attack) 02/14/2020   History of stroke    Mixed hyperlipidemia    Essential hypertension    Impingement syndrome of right shoulder 12/24/2016   Tobacco use 09/28/2016   Glenohumeral arthritis 12/27/2014   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    REFERRING DIAG: Chronic right shoulder pain; Frozen shoulder  THERAPY DIAG:  Chronic right shoulder pain  Stiffness of right shoulder, not elsewhere classified  Muscle weakness (generalized)  Localized edema  Rationale for Evaluation and Treatment Rehabilitation   SUBJECTIVE:                                                                                                                                                                                     SUBJECTIVE STATEMENT: Pt     PERTINENT HISTORY: Hx of debridement and sub acromial decompression x2 (2018 and 2022). Hx of stroke, prediabetes   PAIN:  Are you having pain? Yes: NPRS scale: 6-7/10 Pain location: Whole R shoulder, more in the area Ant R shoulder Pain description: ache, constant Aggravating factors: using arm to press up from a chiar, turning the R arm out Relieving factors: Pain medications   PRECAUTIONS:  None   WEIGHT BEARING RESTRICTIONS No   FALLS:  Has patient fallen in last 6 months? No   LIVING ENVIRONMENT: Lives with: lives with their family Lives in: House/apartment No issue with accessing or mobility within home   OCCUPATION: Not working. Has applied fro disability   PLOF: Independent   PATIENT GOALS Fot my arm to get better, less pain   OBJECTIVE: (objective measures completed at initial evaluation unless otherwise dated)   DIAGNOSTIC FINDINGS:  NA   PATIENT SURVEYS:  FOTO 41% perceived function; 61% predicted   COGNITION:           Overall cognitive status: Within functional limits for tasks assessed                                  SENSATION: WFL   POSTURE: Forward head, rounded shoulders   UPPER EXTREMITY ROM:    Active/AA/PROM ROM Right eval Rt 06/11/22 Rt 7/20  Shoulder flexion 110.110, 110 130 AAROM c table top  110d  Shoulder extension       Shoulder abduction       Shoulder adduction       Shoulder internal rotation       Shoulder external rotation 70     Elbow flexion       Elbow extension       Wrist flexion       Wrist extension       Wrist ulnar deviation       Wrist radial deviation       Wrist pronation       Wrist supination       (Blank rows = not tested)   UPPER EXTREMITY MMT:  Strength testing was only minimally provocative for each motion MMT Right eval Left eval  Shoulder flexion 4    Shoulder extension 4    Shoulder abduction 4    Shoulder adduction 4    Shoulder internal rotation 4    Shoulder  external rotation 4    Middle trapezius      Lower trapezius      Elbow flexion      Elbow extension      Wrist flexion      Wrist extension      Wrist ulnar deviation      Wrist radial deviation      Wrist pronation      Wrist supination      Grip strength (lbs)      (Blank rows = not tested)   SHOULDER SPECIAL TESTS:            Impingement tests: Hawkins/Kennedy impingement test: positive             Rotator cuff assessment: Drop arm test: negative and Empty can test: negative            Biceps assessment: Yergason's test: negative and Speed's test: negative   PALPATION:  TTP of the anterior GH area             TODAY'S TREATMENT:   [facility] Adult PT Treatment:                                                DATE: 06/25/22 Therapeutic Exercise: UBE 5 min forward Pulley for shoulder flexion 4 mins as tolerated 60d doorway stretch x2 20" Wall shoulder flexion x20 ER walkout - YTB Pain free flexion arc in supine (small) 3x10  Manual Therapy: STM to the [facility] jt and upper shoulder musculature and MTPR applied to the upper trap TrPs. CFM was completed to the ant [place] Adult PT Treatment:                                                DATE: 06/17/22 Therapeutic Exercise: Pulley for shoulder flexion 2 mins as tolerated R upper trap stretch c R lateral trunk lean x3 30" 60d doorway stretch x2 20" Wall shoulder flexion x10 Table top shoulder ER x10 Shoulder row 2x10 RTB Shoulder isometrics as tolerated for flex, ext, abd, ER x5 5" Manual Therapy: STM to the [facility] jt and upper shoulder musculature and MTPR applied to the upper trap TrPs. CFM was completed to the ant [place] Adult PT Treatment:                                                DATE: 06/15/22 Therapeutic Exercise: Pulley for shoulder flexion 2 mins as tolerated Table top shoulder flexion x10 Table top shoulder ER x10 Shoulder row 2x10 RTB Shoulder isometrics as tolerated for flex, ext, abd, ER x5 5" Manual  Therapy: STM to the [facility] jt and upper shoulder musculature and MTPR applied to the upper trap. CFM was completed to the ant [facility] jt  Self Care: Pt was instructed  in and completed  CFM to the ant [place] jt. Pt completed correctly.  [place] Adult PT Treatment:                                                DATE: 06/11/22 Therapeutic Exercise: Pulley for shoulder flexion 2 mins as tolerated Table top shoulder flexion x10 Table top shoulder ER x10 Shoulder isometrics as tolerated for flex, ext, abd, ER x5 5"    Home Program: Access Code: [email or web address] URL: [email or web address] Date: 06/11/2022 Prepared by: Gar Ponto  Exercises - Seated Shoulder Flexion Towel Slide at Table Top Full Range of Motion  - 1 x daily - 7 x weekly - 1 sets - 10 reps - 3 hold - Seated Shoulder External Rotation PROM on Table  - 1 x daily - 7 x weekly - 1 sets - 10 reps - 3 hold - Isometric Shoulder Flexion at Wall  - 1 x daily - 7 x weekly - 1 sets - 5 reps - 5 hold - Isometric Shoulder Extension at Wall  - 1 x daily - 7 x weekly - 1 sets - 5 reps - 5 hold - Isometric Shoulder Abduction at Wall  - 1 x daily - 7 x weekly - 1 sets - 5 reps - 5 hold - Standing Isometric Shoulder External Rotation with Doorway  - 1 x daily - 7 x weekly - 1 sets - 5 reps - 5 hold     ASSESSMENT:   CLINICAL IMPRESSION: Pt tolerated PT without adverse effects. PT was provided for ROM and peri-scapular and rotator cuff strengthening. Shwanda continues to struggle with progression d/t high levels of pain.  She does report significant pain reduction following manual.   OBJECTIVE IMPAIRMENTS decreased ROM, decreased strength, impaired flexibility, impaired UE functional use, obesity, and pain.    ACTIVITY LIMITATIONS carrying, lifting, sleeping, bathing, dressing, and reach over head   PARTICIPATION LIMITATIONS: meal prep, cleaning, laundry, driving, and shopping   PERSONAL FACTORS Fitness, Past/current experiences, and Time since  onset of injury/illness/exacerbation are also affecting patient's functional outcome.    REHAB POTENTIAL: Fair due to chronicity of R shoulder issue   CLINICAL DECISION MAKING: Stable/uncomplicated   EVALUATION COMPLEXITY: Low     GOALS:   SHORT TERM GOALS= LTGs   LONG TERM GOALS: Target date: 07/09/22   Pt will report a decrease in average R shoulder pain to 4/10 or less with daily activities  Baseline: 7/10 Goal status: INITIAL   2.  Pt will be able to lift a 2# weight to second shelf x10, not limited by pain Baseline: Limited Goal status: INITIAL   3.  Pt's FOTO score will improve to 61% as indication of improved function Baseline: 41% Goal status: INITIAL   4.  Pt will be Ind in a final HEP to maintain achieved LOF Baseline:  Goal status: INITIAL     PLAN: PT FREQUENCY: 2x/week   PT DURATION: 4 weeks   PLANNED INTERVENTIONS: Therapeutic exercises, Therapeutic activity, Patient/Family education, Joint mobilization, Aquatic Therapy, Dry Needling, Electrical stimulation, Cryotherapy, Moist heat, Taping, Vasopneumatic device, Ultrasound, Ionotophoresis '4mg'$ /ml Dexamethasone, Manual therapy, and Re-evaluation   PLAN FOR NEXT SESSION: Initiate therex and HEP; use of modalities, manual therapy, and TPDN as indicated; Review FOTO. Consider TPDN  Kevan Ny Ryszard Socarras PT 06/25/22 7:05 AM

## 2022-06-28 MED ORDER — TRIAMCINOLONE ACETONIDE 40 MG/ML IJ SUSP
40.0000 mg | INTRAMUSCULAR | Status: AC | PRN
  Administered 2022-06-10: 40 mg via INTRA_ARTICULAR

## 2022-06-28 MED ORDER — BUPIVACAINE HCL 0.25 % IJ SOLN
5.0000 mL | INTRAMUSCULAR | Status: AC | PRN
  Administered 2022-06-10: 5 mL via INTRA_ARTICULAR

## 2022-06-30 ENCOUNTER — Ambulatory Visit: Payer: Self-pay | Attending: Physician Assistant

## 2022-06-30 DIAGNOSIS — M25611 Stiffness of right shoulder, not elsewhere classified: Secondary | ICD-10-CM | POA: Insufficient documentation

## 2022-06-30 DIAGNOSIS — M6281 Muscle weakness (generalized): Secondary | ICD-10-CM | POA: Insufficient documentation

## 2022-06-30 DIAGNOSIS — M25511 Pain in right shoulder: Secondary | ICD-10-CM | POA: Insufficient documentation

## 2022-06-30 DIAGNOSIS — G8929 Other chronic pain: Secondary | ICD-10-CM | POA: Insufficient documentation

## 2022-06-30 NOTE — Therapy (Addendum)
OUTPATIENT PHYSICAL THERAPY TREATMENT NOTE/Discharge   Patient Name: Meagan Flores MRN: [id number] DOB:12/20/1965, 56 y.o., female Today's Date: 06/30/2022  PCP: Storm Frisk, MD REFERRING PROVIDER: Cristie Hem, PA-C  END OF SESSION:   PT End of Session - 06/30/22 0807     Visit Number 6    Number of Visits 9    Date for PT Re-Evaluation 07/09/22    Authorization Type CAFA    PT Start Time 0803    PT Stop Time 0845    PT Time Calculation (min) 42 min    Activity Tolerance Patient tolerated treatment well;Patient limited by pain    Behavior During Therapy [facility] for tasks assessed/performed                 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   Prediabetes    Preop cardiovascular exam 11/25/2020   Pulmonary hypertension, unspecified (HCC) 11/25/2020   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   Rotator cuff impingement syndrome of right shoulder 09/30/2020   Skin rash 02/17/2021   Stroke [facility])    Tendinopathy of right biceps tendon 09/30/2020   Thyroid disease    TIA (transient ischemic attack) 04/15/2018   Tobacco use 09/28/2016   Past Surgical History:  Procedure Laterality Date   BICEPT TENODESIS Right 12/16/2021   Procedure: RIGHT BICEPS TENODESIS;  Surgeon: Tarry Kos, MD;  Location: [facility];  Service: Orthopedics;  Laterality: Right;   PARTIAL HYSTERECTOMY     right arm sx     SHOULDER ARTHROSCOPY WITH  BICEPSTENOTOMY Right 01/21/2021   Procedure: SHOULDER ARTHROSCOPY WITH BICEPSTENOTOMY;  Surgeon: Tarry Kos, MD;  Location: [facility];  Service: Orthopedics;  Laterality: Right;   SHOULDER ARTHROSCOPY WITH DISTAL CLAVICLE RESECTION Right 01/12/2017   Procedure: RIGHT SHOULDER ARTHROSCOPY WITH  SUBACROMIAL DECOMPRESSION, DISTAL CLAVICLE EXCISION, extensive debridement;  Surgeon: Tarry Kos, MD;  Location: [facility];  Service: Orthopedics;  Laterality: Right;   THYMECTOMY  2004   THYROIDECTOMY     Patient Active Problem List   Diagnosis Date Noted   BMI 33.0-33.9,adult 05/31/2022   Biceps tendonosis of right shoulder 02/10/2022   S/P arthroscopy of right shoulder 02/10/2022   Skin rash 02/17/2021   Pulmonary hypertension, unspecified (HCC) 11/25/2020   Prediabetes    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) tear of right shoulder 09/30/2020   Rotator cuff impingement syndrome of right shoulder 09/30/2020   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   Tendinopathy of right biceps tendon 09/30/2020   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   Aneurysm (HCC) 03/18/2020   History of TIA (transient ischemic attack) 02/14/2020   History of stroke    Mixed hyperlipidemia    Essential hypertension    Impingement syndrome of right shoulder 12/24/2016   Tobacco use 09/28/2016   Glenohumeral arthritis 12/27/2014   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    REFERRING DIAG: Chronic right shoulder pain; Frozen shoulder  THERAPY DIAG:  Chronic right shoulder pain  Stiffness of right shoulder, not elsewhere classified  Muscle weakness (generalized)  Rationale for Evaluation and Treatment Rehabilitation   SUBJECTIVE:                                                                                                                                                                                     SUBJECTIVE STATEMENT: Pt her R shoulder is  not improving.   PERTINENT HISTORY: Hx of debridement and sub acromial decompression x2 (2018 and 2022). Hx of stroke, prediabetes   PAIN:  Are you having pain? Yes: NPRS scale: 6-7/10 Pain location: Whole R shoulder, more in the area Ant R shoulder Pain description: ache, constant Aggravating factors: using arm to press up from a chiar, turning the R arm out Relieving factors: Pain medications   PRECAUTIONS:  None   WEIGHT BEARING RESTRICTIONS No   FALLS:  Has patient fallen in last 6 months? No   LIVING ENVIRONMENT: Lives with: lives with their family Lives in: House/apartment No issue with accessing or mobility within home   OCCUPATION: Not working. Has applied fro disability   PLOF: Independent   PATIENT GOALS Fot my arm to get better, less pain   OBJECTIVE: (objective measures completed at initial evaluation unless otherwise dated)   DIAGNOSTIC FINDINGS:  NA   PATIENT SURVEYS:  FOTO 41% perceived function; 61% predicted; 06/30/22 41%   COGNITION:           Overall cognitive status: Within functional limits for tasks assessed                                  SENSATION: WFL   POSTURE: Forward head, rounded shoulders   UPPER EXTREMITY ROM:    Active/AA/PROM ROM Right eval Rt 06/11/22 Rt 7/20 Rt 06/30/22  Shoulder flexion 110.110, 110 130 AAROM c table top  110d 110d AROM  Shoulder extension        Shoulder abduction        Shoulder adduction        Shoulder internal rotation        Shoulder external rotation 70      Elbow flexion        Elbow extension        Wrist flexion        Wrist extension        Wrist ulnar deviation        Wrist radial deviation        Wrist pronation        Wrist supination        (Blank rows = not tested)  UPPER EXTREMITY MMT:                       Strength testing was only minimally provocative for each motion MMT Right eval Left eval  Shoulder flexion 4    Shoulder extension 4    Shoulder abduction 4     Shoulder adduction 4    Shoulder internal rotation 4    Shoulder external rotation 4    Middle trapezius      Lower trapezius      Elbow flexion      Elbow extension      Wrist flexion      Wrist extension      Wrist ulnar deviation      Wrist radial deviation      Wrist pronation      Wrist supination      Grip strength (lbs)      (Blank rows = not tested)   SHOULDER SPECIAL TESTS:            Impingement tests: Hawkins/Kennedy impingement test: positive             Rotator cuff assessment: Drop arm test: negative and Empty can test: negative            Biceps assessment: Yergason's test: negative and Speed's test: negative   PALPATION:  TTP of the anterior GH area             TODAY'S TREATMENT:  [facility] Adult PT Treatment:                                                DATE: 06/30/22 Therapeutic Exercise: Pulley for shoulder flexion 3 mins as tolerated 60d doorway stretch x2 20" Shoulder row 2x10 RTB Shoulder ext Wall shoulder flexion x20 ER walkout - YTB IR walkout- YTB Pain free flexion arc in supine (small) 3x10 Self Care: FOTO 41% reviewed with pt.  [facility] Adult PT Treatment:                                                DATE: 06/25/22 Therapeutic Exercise: UBE 5 min forward Pulley for shoulder flexion 4 mins as tolerated 60d doorway stretch x2 20" Wall shoulder flexion x20 ER walkout - YTB Pain free flexion arc in supine (small) 3x10  Manual Therapy: STM to the [facility] jt and upper shoulder musculature and MTPR applied to the upper trap TrPs. CFM was completed to the ant [facility] jt  [facility] Adult PT Treatment:                                                DATE: 06/17/22 Therapeutic Exercise: Pulley for shoulder flexion 2 mins as tolerated R upper trap stretch c R lateral trunk lean x3 30" 60d doorway stretch x2 20" Wall shoulder flexion x10 Table top shoulder ER x10 Shoulder row 2x10 RTB Shoulder isometrics as tolerated for flex, ext, abd, ER x5 5" Manual Therapy: STM  to the [facility] jt and upper shoulder musculature and MTPR applied to the upper trap TrPs. CFM was completed to  the ant [facility] jt  [facility] Adult PT Treatment:                                                DATE: 06/15/22 Therapeutic Exercise: Pulley for shoulder flexion 2 mins as tolerated Table top shoulder flexion x10 Table top shoulder ER x10 Shoulder row 2x10 RTB Shoulder isometrics as tolerated for flex, ext, abd, ER x5 5" Manual Therapy: STM to the [facility] jt and upper shoulder musculature and MTPR applied to the upper trap. CFM was completed to the ant [facility] jt  Self Care: Pt was instructed in and completed  CFM to the ant [facility] jt. Pt completed correctly.  [facility] Adult PT Treatment:                                                DATE: 06/11/22 Therapeutic Exercise: Pulley for shoulder flexion 2 mins as tolerated Table top shoulder flexion x10 Table top shoulder ER x10 Shoulder isometrics as tolerated for flex, ext, abd, ER x5 5"    Home Program: Access Code: [email or web address] URL: [email or web address] Date: 06/11/2022 Prepared by: Joellyn Rued  Exercises - Seated Shoulder Flexion Towel Slide at Table Top Full Range of Motion  - 1 x daily - 7 x weekly - 1 sets - 10 reps - 3 hold - Seated Shoulder External Rotation PROM on Table  - 1 x daily - 7 x weekly - 1 sets - 10 reps - 3 hold - Isometric Shoulder Flexion at Wall  - 1 x daily - 7 x weekly - 1 sets - 5 reps - 5 hold - Isometric Shoulder Extension at Wall  - 1 x daily - 7 x weekly - 1 sets - 5 reps - 5 hold - Isometric Shoulder Abduction at Wall  - 1 x daily - 7 x weekly - 1 sets - 5 reps - 5 hold - Standing Isometric Shoulder External Rotation with Doorway  - 1 x daily - 7 x weekly - 1 sets - 5 reps - 5 hold     ASSESSMENT:   CLINICAL IMPRESSION: Pt tolerated PT without adverse effects. Pt's R shoulder is not improving per report of pain or with her AROM for flexion. FOTO was reassessed for perceived LOF and pt's score remained the same  as the eval at 41%. Pt has an appt with Dr. [name] tomorrow. Will continue PT as indicated.   OBJECTIVE IMPAIRMENTS decreased ROM, decreased strength, impaired flexibility, impaired UE functional use, obesity, and pain.    ACTIVITY LIMITATIONS carrying, lifting, sleeping, bathing, dressing, and reach over head   PARTICIPATION LIMITATIONS: meal prep, cleaning, laundry, driving, and shopping   PERSONAL FACTORS Fitness, Past/current experiences, and Time since onset of injury/illness/exacerbation are also affecting patient's functional outcome.    REHAB POTENTIAL: Fair due to chronicity of R shoulder issue   CLINICAL DECISION MAKING: Stable/uncomplicated   EVALUATION COMPLEXITY: Low     GOALS:   SHORT TERM GOALS= LTGs   LONG TERM GOALS: Target date: 07/09/22   Pt will report a decrease in average R shoulder pain to 4/10 or less with daily activities  Baseline: 7/10 Goal status: INITIAL   2.  Pt will be able to lift a 2# weight  to second shelf x10, not limited by pain Baseline: Limited Goal status: INITIAL   3.  Pt's FOTO score will improve to 61% as indication of improved function Baseline: 41% Status: 06/30/22=41% Goal status: INITIAL   4.  Pt will be Ind in a final HEP to maintain achieved LOF Baseline:  Goal status: INITIAL     PLAN: PT FREQUENCY: 2x/week   PT DURATION: 4 weeks   PLANNED INTERVENTIONS: Therapeutic exercises, Therapeutic activity, Patient/Family education, Joint mobilization, Aquatic Therapy, Dry Needling, Electrical stimulation, Cryotherapy, Moist heat, Taping, Vasopneumatic device, Ultrasound, Ionotophoresis 4mg /ml Dexamethasone, Manual therapy, and Re-evaluation   PLAN FOR NEXT SESSION: Initiate therex and HEP; use of modalities, manual therapy, and TPDN as indicated; Review FOTO. Consider TPDN  Joellyn Rued MS, PT 06/30/22 8:49 AM  PHYSICAL THERAPY DISCHARGE SUMMARY  Visits from Start of Care: 6  Current functional level related to goals /  functional outcomes: See clinical findings and goals   Remaining deficits: See clininical findings and goals   Education / Equipment: HEP   Patient agrees to discharge. Patient goals were not met. Patient is being discharged due to  Referred to pain clinic and Dced from OPPT due to lack of progress.Freida Busman Alene Bergerson MS, PT 06/16/23 10:07 AM

## 2022-07-01 ENCOUNTER — Ambulatory Visit (INDEPENDENT_AMBULATORY_CARE_PROVIDER_SITE_OTHER): Payer: Self-pay | Admitting: Orthopaedic Surgery

## 2022-07-01 ENCOUNTER — Encounter: Payer: Self-pay | Admitting: Orthopaedic Surgery

## 2022-07-01 ENCOUNTER — Telehealth: Payer: Self-pay

## 2022-07-01 DIAGNOSIS — Z9889 Other specified postprocedural states: Secondary | ICD-10-CM

## 2022-07-01 DIAGNOSIS — G8929 Other chronic pain: Secondary | ICD-10-CM

## 2022-07-01 DIAGNOSIS — M25511 Pain in right shoulder: Secondary | ICD-10-CM

## 2022-07-01 NOTE — Progress Notes (Signed)
Office Visit Note   Patient: Meagan Flores           Date of Birth: 06-26-1966           MRN: [id number] Visit Date: 07/01/2022              Requested by: Elsie Stain, MD [address] [place],  [place] [address] PCP: Elsie Stain, MD   Assessment & Plan: Visit Diagnoses:  1. Chronic right shoulder pain   2. S/P arthroscopy of right shoulder     Plan: Meagan Flores returns today for follow-up of right shoulder pain.  She states she fell about a days worth of relief from the shoulder injection that was performed on 06/10/2022.  She has been going to physical therapy as well.  She states that she is in constant pain that makes her want to cry.  Examination of the right shoulder is unchanged.  From my standpoint I cannot think of anything else to do in terms of controlling her pain other than a referral to a chronic pain management clinic.  She has done extensive physical therapy and has undergone 3 surgeries all of which have not been able to improve her pain.  She is undergone cervical spine epidural steroid injections as well.  We will make a referral to a pain management clinic at this time.  Do not see any benefit to continuing physical therapy.  We will see her back as needed.  Follow-Up Instructions: No follow-ups on file.   Orders:  No orders of the defined types were placed in this encounter.  No orders of the defined types were placed in this encounter.     Procedures: No procedures performed   Clinical Data: No additional findings.   Subjective: Chief Complaint  Patient presents with   Right Shoulder - Pain, Follow-up    HPI  Review of Systems   Objective: Vital Signs: There were no vitals taken for this visit.  Physical Exam  Ortho Exam  Specialty Comments:  No specialty comments available.  Imaging: No results found.   PMFS History: Patient Active Problem List   Diagnosis Date Noted   Chronic right shoulder pain 07/01/2022    BMI 33.0-33.9,adult 05/31/2022   Biceps tendonosis of right shoulder 02/10/2022   S/P arthroscopy of right shoulder 02/10/2022   Skin rash 02/17/2021   Pulmonary hypertension, unspecified ([place]) 11/25/2020   Prediabetes    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) tear of right shoulder 09/30/2020   Rotator cuff impingement syndrome of right shoulder 09/30/2020   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   Tendinopathy of right biceps tendon 09/30/2020   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   Aneurysm ([place]) 03/18/2020   History of TIA (transient ischemic attack) 02/14/2020   History of stroke    Mixed hyperlipidemia    Essential hypertension    Impingement syndrome of right shoulder 12/24/2016   Tobacco use 09/28/2016   Glenohumeral arthritis 12/27/2014   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   Past Medical History:  Diagnosis Date   Acute pain of left shoulder 09/08/2017   Aneurysm ([place]) 03/18/2020   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   COVID-19 virus infection 02/14/2020   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) tear of right shoulder 09/30/2020   Essential hypertension    Glenohumeral arthritis 12/27/2014   History of stroke    History of TIA (transient ischemic attack) 02/14/2020   Hyperlipidemia    Hypothyroidism    Impingement syndrome of right  shoulder 12/24/2016   Mixed hyperlipidemia    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   Prediabetes    Preop cardiovascular exam 11/25/2020   Pulmonary hypertension, unspecified ([place]) 11/25/2020   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   Rotator cuff impingement syndrome of right shoulder 09/30/2020   Skin rash 02/17/2021   Stroke [facility])    Tendinopathy of right biceps tendon 09/30/2020   Thyroid disease    TIA (transient ischemic attack) 04/15/2018   Tobacco use 09/28/2016    Family History  Problem Relation Age of Onset   Stomach cancer Sister    Colon polyps Sister     Esophageal cancer Neg Hx    Rectal cancer Neg Hx    Colon cancer Neg Hx    Breast cancer Neg Hx     Past Surgical History:  Procedure Laterality Date   BICEPT TENODESIS Right 12/16/2021   Procedure: RIGHT BICEPS TENODESIS;  Surgeon: Leandrew Koyanagi, MD;  Location: [place];  Service: Orthopedics;  Laterality: Right;   PARTIAL HYSTERECTOMY     right arm sx     SHOULDER ARTHROSCOPY WITH BICEPSTENOTOMY Right 01/21/2021   Procedure: SHOULDER ARTHROSCOPY WITH BICEPSTENOTOMY;  Surgeon: Leandrew Koyanagi, MD;  Location: [place];  Service: Orthopedics;  Laterality: Right;   SHOULDER ARTHROSCOPY WITH DISTAL CLAVICLE RESECTION Right 01/12/2017   Procedure: RIGHT SHOULDER ARTHROSCOPY WITH  SUBACROMIAL DECOMPRESSION, DISTAL CLAVICLE EXCISION, extensive debridement;  Surgeon: Leandrew Koyanagi, MD;  Location: [place];  Service: Orthopedics;  Laterality: Right;   THYMECTOMY  2004   THYROIDECTOMY     Social History   Occupational History   Not on file  Tobacco Use   Smoking status: Every Day    Packs/day: 0.50    Years: 34.00    Total pack years: 17.00    Types: Cigarettes   Smokeless tobacco: Never  Vaping Use   Vaping Use: Never used  Substance and Sexual Activity   Alcohol use: Yes    Comment: occassional   Drug use: No   Sexual activity: Not on file

## 2022-07-01 NOTE — Telephone Encounter (Signed)
Patient called to cancel all appts, she stated Dr.Xu is sending her to a chronic pain provider due to PT not helping.

## 2022-07-02 ENCOUNTER — Ambulatory Visit: Payer: Self-pay

## 2022-07-07 ENCOUNTER — Ambulatory Visit: Payer: Self-pay

## 2022-07-08 ENCOUNTER — Encounter: Payer: Self-pay | Admitting: Physical Medicine & Rehabilitation

## 2022-07-19 ENCOUNTER — Other Ambulatory Visit: Payer: Self-pay | Admitting: Critical Care Medicine

## 2022-07-20 NOTE — Telephone Encounter (Signed)
Refilled 05/31/22 # 90 with 1 refill. Requested Prescriptions  Refused Prescriptions Disp Refills  . levothyroxine (SYNTHROID) 50 MCG tablet [Pharmacy Med Name: Levothyroxine Sodium 50 MCG Oral Tablet] 60 tablet 0    Sig: TAKE 2 TABLETS BY MOUTH ONCE DAILY BEFORE BREAKFAST     Endocrinology:  Hypothyroid Agents Failed - 07/19/2022 10:40 AM      Failed - TSH in normal range and within 360 days    TSH  Date Value Ref Range Status  06/29/2021 6.990 (H) 0.450 - 4.500 uIU/mL Final         Passed - Valid encounter within last 12 months    Recent Outpatient Visits          1 month ago Essential hypertension   [place], MD   5 months ago Essential hypertension   [place], [place]   9 months ago Essential hypertension   [place], Patrick E, MD   1 year ago Essential hypertension   [place], MD   1 year ago Nausea   [place], [place]      Future Appointments            In 1 month Kirsteins, Luanna Salk, MD [facility], [facility]   In 3 months Joya Gaskins Burnett Harry, MD [place]

## 2022-09-07 ENCOUNTER — Encounter: Payer: Self-pay | Attending: Physical Medicine & Rehabilitation | Admitting: Physical Medicine & Rehabilitation

## 2022-09-07 ENCOUNTER — Encounter: Payer: Self-pay | Admitting: Physical Medicine & Rehabilitation

## 2022-09-07 VITALS — BP 134/87 | HR 69 | Ht 68.0 in | Wt 214.6 lb

## 2022-09-07 DIAGNOSIS — G8929 Other chronic pain: Secondary | ICD-10-CM

## 2022-09-07 DIAGNOSIS — M25511 Pain in right shoulder: Secondary | ICD-10-CM

## 2022-09-07 MED ORDER — MELOXICAM 7.5 MG PO TABS: 7.5000 mg | ORAL_TABLET | Freq: Every day | ORAL | 1 refills | Status: DC

## 2022-09-07 MED ORDER — CYCLOBENZAPRINE HCL 5 MG PO TABS: 5.0000 mg | ORAL_TABLET | Freq: Every day | ORAL | 1 refills | Status: DC

## 2022-09-07 NOTE — Progress Notes (Signed)
Subjective:    Patient ID: Meagan Flores, female    DOB: Oct 08, 1966, 56 y.o.   MRN: [id number]  HPI CC RIght shoulder pain 56 yo female with ~5 yr hx of Right shoulder pain , underwent Right distal clavicular resection per Dr Erlinda Hong in 2018, RIght biceps tenotomy and labral repair in 2022, Biceps tenodesis in 2023.  Pt had PT after each surgery.  Pt has had persistent pain despite Pain with lifting arm and rotating out Also nighttime pain is fairly severe and interferes with sleep.  Uses Biofreeze at night   Lost job due to this issue, awaiting disability   Has difficulty lifting laundry basket   Difficulty doing arm ergometry in therapy  Prior hx of ulnar release  Does not want pain meds because percocet makes her fel bad Pain Inventory Average Pain 7 Pain Right Now 7 My pain is sharp  In the last 24 hours, has pain interfered with the following? General activity 7 Relation with others 0 Enjoyment of life 7 What TIME of day is your pain at its worst? morning , daytime, evening, and night Sleep (in general) NA  Pain is worse with: some activites Pain improves with: therapy/exercise and medication Relief from Meds: 2  walk without assistance  not employed: date last employed 01/16/21 I need assistance with the following:  dressing, bathing, meal prep, and household duties  depression  Any changes since last visit?  no  Orthopedist Dr Erlinda Hong    Family History  Problem Relation Age of Onset   Stomach cancer Sister    Colon polyps Sister    Esophageal cancer Neg Hx    Rectal cancer Neg Hx    Colon cancer Neg Hx    Breast cancer Neg Hx    Social History   Socioeconomic History   Marital status: Married    Spouse name: Not on file   Number of children: Not on file   Years of education: Not on file   Highest education level: Not on file  Occupational History   Not on file  Tobacco Use   Smoking status: Every Day    Packs/day: 0.50    Years: 34.00    Total  pack years: 17.00    Types: Cigarettes   Smokeless tobacco: Never  Vaping Use   Vaping Use: Never used  Substance and Sexual Activity   Alcohol use: Yes    Comment: occassional   Drug use: No   Sexual activity: Not on file  Other Topics Concern   Not on file  Social History Narrative   Not on file   Social Determinants of Health   Financial Resource Strain: Not on file  Food Insecurity: Not on file  Transportation Needs: Not on file  Physical Activity: Not on file  Stress: Not on file  Social Connections: Not on file   Past Surgical History:  Procedure Laterality Date   BICEPT TENODESIS Right 12/16/2021   Procedure: RIGHT BICEPS TENODESIS;  Surgeon: Leandrew Koyanagi, MD;  Location: [place];  Service: Orthopedics;  Laterality: Right;   PARTIAL HYSTERECTOMY     right arm sx     SHOULDER ARTHROSCOPY WITH BICEPSTENOTOMY Right 01/21/2021   Procedure: SHOULDER ARTHROSCOPY WITH BICEPSTENOTOMY;  Surgeon: Leandrew Koyanagi, MD;  Location: [place];  Service: Orthopedics;  Laterality: Right;   SHOULDER ARTHROSCOPY WITH DISTAL CLAVICLE RESECTION Right 01/12/2017   Procedure: RIGHT SHOULDER ARTHROSCOPY WITH  SUBACROMIAL DECOMPRESSION, DISTAL CLAVICLE EXCISION, extensive  debridement;  Surgeon: Leandrew Koyanagi, MD;  Location: [place];  Service: Orthopedics;  Laterality: Right;   THYMECTOMY  2004   THYROIDECTOMY     Past Medical History:  Diagnosis Date   Acute pain of left shoulder 09/08/2017   Aneurysm ([place]) 03/18/2020   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   COVID-19 virus infection 02/14/2020   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) tear of right shoulder 09/30/2020   Essential hypertension    Glenohumeral arthritis 12/27/2014   History of stroke    History of TIA (transient ischemic attack) 02/14/2020   Hyperlipidemia    Hypothyroidism    Impingement syndrome of right shoulder 12/24/2016   Mixed  hyperlipidemia    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   Prediabetes    Preop cardiovascular exam 11/25/2020   Pulmonary hypertension, unspecified ([place]) 11/25/2020   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   Rotator cuff impingement syndrome of right shoulder 09/30/2020   Skin rash 02/17/2021   Stroke [facility])    Tendinopathy of right biceps tendon 09/30/2020   Thyroid disease    TIA (transient ischemic attack) 04/15/2018   Tobacco use 09/28/2016   BP 134/87   Pulse 69   Ht '5\' 8"'$  (1.727 m)   Wt 214 lb 9.6 oz (97.3 kg)   SpO2 96%   BMI 32.63 kg/m   Opioid Risk Score:   Fall Risk Score:  `1  Depression screen [facility] 2/9     09/07/2022   10:12 AM 05/31/2022    8:40 AM 09/30/2021    9:18 AM 09/29/2021    8:44 AM 09/09/2021    8:48 AM 07/29/2021   11:33 AM 06/29/2021    8:39 AM  Depression screen [facility] 2/9  Decreased Interest 1 1 0 0 0 0 0  Down, Depressed, Hopeless '1 1 1 '$ 0 1  0  PHQ - 2 Score '2 2 1 '$ 0 1 0 0  Altered sleeping 1 0 0 0 0 0 0  Tired, decreased energy 0 0 1 0 1 1 0  Change in appetite 0 0 0 0 0 0 0  Feeling bad or failure about yourself  0  0 0 1 0 0  Trouble concentrating 0 0 0 0 0 0 0  Moving slowly or fidgety/restless 0 0 0 0 0 0 0  Suicidal thoughts 0 0 0 0 0 0 0  PHQ-9 Score '3 2 2 '$ 0 3 1 0     Review of Systems  Constitutional: Negative.   HENT: Negative.    Eyes: Negative.   Respiratory: Negative.    Cardiovascular: Negative.   Gastrointestinal: Negative.   Endocrine: Negative.   Genitourinary: Negative.   Musculoskeletal:        Shoulder pain right arm- hx surg x 3 last one  feb /2023  Skin: Negative.   Allergic/Immunologic: Negative.   Neurological: Negative.   Hematological: Negative.   Psychiatric/Behavioral:  Positive for dysphoric mood.   All other systems reviewed and are negative.      Objective:   Physical Exam Vitals and nursing note reviewed.  Constitutional:      Appearance: She is obese.  HENT:     Head: Normocephalic and atraumatic.  Eyes:      Extraocular Movements: Extraocular movements intact.     Conjunctiva/sclera: Conjunctivae normal.     Pupils: Pupils are equal, round, and reactive to light.  Musculoskeletal:     Comments: Right shoulder no tenderness palpation over the [name] joint or in the subacromial area  mild tenderness over the upper trap and infraspinatus area but this is not her main pain.  She is normal external rotation range pain limits shoulder abduction and forward flexion to approximately 90 degrees. Periscapular musculature is mildly atrophied on the right side compared to the left side. Patient is able to reach to 90 degrees actively as well as 90 degrees of abduction but is unable to tolerate strength testing due to pain.  No pain with horizontal adduction of the right upper extremity at the shoulder. Ambulates without assist device no evidence of toe drag and instability. Left lower extremity has normal strength range of motion and no pain with range of motion.  Skin:    General: Skin is warm and dry.  Neurological:     Mental Status: She is alert and oriented to person, place, and time.  Psychiatric:        Mood and Affect: Mood normal.        Behavior: Behavior normal.           Assessment & Plan:   1.  Chronic shoulder pain she has persistent shoulder impingement syndrome times despite adequate decompression.  She likely has some soft tissue restrictions but has not responded well to physical therapy which has been done on multiple occasions. We discussed multimodal approach.  We will trial meloxicam 7.5 daily monitor for GI symptoms.  Start Flexeril 5 mg at night may need to escalate to 10. We will schedule for suprascapular nerve block

## 2022-10-19 ENCOUNTER — Encounter: Payer: Self-pay | Attending: Physical Medicine & Rehabilitation | Admitting: Physical Medicine & Rehabilitation

## 2022-10-19 ENCOUNTER — Encounter: Payer: Self-pay | Admitting: Physical Medicine & Rehabilitation

## 2022-10-19 VITALS — BP 157/93 | HR 78 | Ht 68.0 in | Wt 212.0 lb

## 2022-10-19 DIAGNOSIS — M25511 Pain in right shoulder: Secondary | ICD-10-CM | POA: Insufficient documentation

## 2022-10-19 DIAGNOSIS — G8929 Other chronic pain: Secondary | ICD-10-CM | POA: Insufficient documentation

## 2022-10-19 NOTE — Progress Notes (Signed)
Subjective:    Patient ID: Meagan Flores, female    DOB: Aug 23, 1966, 56 y.o.   MRN: [id number] 56 yo female with ~5 yr hx of Right shoulder pain , underwent Right distal clavicular resection per Dr Erlinda Hong in 2018, RIght biceps tenotomy and labral repair in 2022, Biceps tenodesis in 2023.  Pt had PT after each surgery.  Pt has had persistent pain despite Pain with lifting arm and rotating out Also nighttime pain is fairly severe and interferes with sleep.  Uses Biofreeze at night   Lost job due to this issue, awaiting disability   Has difficulty lifting laundry basket   Difficulty doing arm ergometry in therapy  Prior hx of ulnar release  Does not want pain meds because percocet makes her fel bad HPI  Patient upset today could not get injection due to insurance funding reasons.  She was scheduled for right suprascapular nerve block.  We discussed that the nerve block was expected to help with shoulder pain but not cure it.  We discussed that we would expect a partial relief of symptoms for perhaps a couple months and then repeat procedures would be needed.  We discussed medication management.  Specifically stopping Mobic due to stomach upset.  We discussed that other pain medications would have similar side effects to oxycodone she tried the Tylenol with codeine with equivocal relief Pain Inventory Average Pain 8 Pain Right Now 7 My pain is sharp  In the last 24 hours, has pain interfered with the following? General activity 10 Relation with others 10 Enjoyment of life 10 What TIME of day is your pain at its worst? morning , daytime, evening, and night Sleep (in general) NA  Pain is worse with: some activites Pain improves with: rest Relief from Meds:  did not answer. Says Meloxicam makes her throw up  Family History  Problem Relation Age of Onset   Stomach cancer Sister    Colon polyps Sister    Esophageal cancer Neg Hx    Rectal cancer Neg Hx    Colon cancer Neg Hx    Breast  cancer Neg Hx    Social History   Socioeconomic History   Marital status: Married    Spouse name: Not on file   Number of children: Not on file   Years of education: Not on file   Highest education level: Not on file  Occupational History   Not on file  Tobacco Use   Smoking status: Every Day    Packs/day: 0.50    Years: 34.00    Total pack years: 17.00    Types: Cigarettes   Smokeless tobacco: Never  Vaping Use   Vaping Use: Never used  Substance and Sexual Activity   Alcohol use: Yes    Comment: occassional   Drug use: No   Sexual activity: Not on file  Other Topics Concern   Not on file  Social History Narrative   Not on file   Social Determinants of Health   Financial Resource Strain: Not on file  Food Insecurity: Not on file  Transportation Needs: Not on file  Physical Activity: Not on file  Stress: Not on file  Social Connections: Not on file   Past Surgical History:  Procedure Laterality Date   BICEPT TENODESIS Right 12/16/2021   Procedure: RIGHT BICEPS TENODESIS;  Surgeon: Leandrew Koyanagi, MD;  Location: [place];  Service: Orthopedics;  Laterality: Right;   PARTIAL HYSTERECTOMY     right  arm sx     SHOULDER ARTHROSCOPY WITH BICEPSTENOTOMY Right 01/21/2021   Procedure: SHOULDER ARTHROSCOPY WITH BICEPSTENOTOMY;  Surgeon: Leandrew Koyanagi, MD;  Location: [place];  Service: Orthopedics;  Laterality: Right;   SHOULDER ARTHROSCOPY WITH DISTAL CLAVICLE RESECTION Right 01/12/2017   Procedure: RIGHT SHOULDER ARTHROSCOPY WITH  SUBACROMIAL DECOMPRESSION, DISTAL CLAVICLE EXCISION, extensive debridement;  Surgeon: Leandrew Koyanagi, MD;  Location: [place];  Service: Orthopedics;  Laterality: Right;   THYMECTOMY  2004   THYROIDECTOMY     Past Surgical History:  Procedure Laterality Date   BICEPT TENODESIS Right 12/16/2021   Procedure: RIGHT BICEPS TENODESIS;  Surgeon: Leandrew Koyanagi, MD;  Location: [place];   Service: Orthopedics;  Laterality: Right;   PARTIAL HYSTERECTOMY     right arm sx     SHOULDER ARTHROSCOPY WITH BICEPSTENOTOMY Right 01/21/2021   Procedure: SHOULDER ARTHROSCOPY WITH BICEPSTENOTOMY;  Surgeon: Leandrew Koyanagi, MD;  Location: [place];  Service: Orthopedics;  Laterality: Right;   SHOULDER ARTHROSCOPY WITH DISTAL CLAVICLE RESECTION Right 01/12/2017   Procedure: RIGHT SHOULDER ARTHROSCOPY WITH  SUBACROMIAL DECOMPRESSION, DISTAL CLAVICLE EXCISION, extensive debridement;  Surgeon: Leandrew Koyanagi, MD;  Location: [place];  Service: Orthopedics;  Laterality: Right;   THYMECTOMY  2004   THYROIDECTOMY     Past Medical History:  Diagnosis Date   Acute pain of left shoulder 09/08/2017   Aneurysm ([place]) 03/18/2020   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   COVID-19 virus infection 02/14/2020   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) tear of right shoulder 09/30/2020   Essential hypertension    Glenohumeral arthritis 12/27/2014   History of stroke    History of TIA (transient ischemic attack) 02/14/2020   Hyperlipidemia    Hypothyroidism    Impingement syndrome of right shoulder 12/24/2016   Mixed hyperlipidemia    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   Prediabetes    Preop cardiovascular exam 11/25/2020   Pulmonary hypertension, unspecified ([place]) 11/25/2020   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   Rotator cuff impingement syndrome of right shoulder 09/30/2020   Skin rash 02/17/2021   Stroke [facility])    Tendinopathy of right biceps tendon 09/30/2020   Thyroid disease    TIA (transient ischemic attack) 04/15/2018   Tobacco use 09/28/2016   BP (!) 157/93   Pulse 78   Ht '5\' 8"'$  (1.727 m)   Wt 212 lb (96.2 kg)   SpO2 95%   BMI 32.23 kg/m   Opioid Risk Score:   Fall Risk Score:  `1  Depression screen [facility] 2/9     10/19/2022   10:24 AM 09/07/2022   10:12 AM 05/31/2022    8:40 AM 09/30/2021    9:18 AM 09/29/2021    8:44 AM  09/09/2021    8:48 AM 07/29/2021   11:33 AM  Depression screen [facility] 2/9  Decreased Interest '1 1 1 '$ 0 0 0 0  Down, Depressed, Hopeless '1 1 1 1 '$ 0 1   PHQ - 2 Score '2 2 2 1 '$ 0 1 0  Altered sleeping  1 0 0 0 0 0  Tired, decreased energy  0 0 1 0 1 1  Change in appetite  0 0 0 0 0 0  Feeling bad or failure about yourself   0  0 0 1 0  Trouble concentrating  0 0 0 0 0 0  Moving slowly or fidgety/restless  0 0 0 0 0 0  Suicidal thoughts  0 0  0 0 0 0  PHQ-9 Score  '3 2 2 '$ 0 3 1     Review of Systems  Constitutional: Negative.   HENT: Negative.    Eyes: Negative.   Respiratory: Negative.    Cardiovascular: Negative.   Gastrointestinal: Negative.   Endocrine: Negative.   Genitourinary: Negative.   Musculoskeletal:        Right shoulder pain- unable to get injection because of cost  Skin: Negative.   Allergic/Immunologic: Negative.   Neurological: Negative.   Hematological: Negative.   Psychiatric/Behavioral: Negative.    All other systems reviewed and are negative.      Objective:   Physical Exam  General no acute distress Mood and affect are appropriate patient appears comfortable at rest 80 to 90 degrees of forward flexion as well as abduction the patient experiences 8 out of 10 pain. The patient has no swelling in the right hand or wrist area no skin changes no hydrocyst or hyperhidrosis.  No dystrophic nail changes.  No temperature changes. No hypersensitivity to touch in the upper extremity.  No pain with cervical spine range of motion.      Assessment & Plan:  1.  Chronic postoperative pain right shoulder with history of biceps tenotomy x2, distal clavicular resection, rotator cuff repair.  The patient has undergone physical therapy.  She has not had good relief with Tylenol with codeine and oxycodone because of side effects that she cannot tolerate. We discussed the role of nerve blocks for the shoulder.  We discussed that they are not curative but rather reduced pain on a  temporary basis.  The goal would be to reduce at least 50% pain for couple months.  This can be repeated every 3 months as needed.  The patient will consider this. Other potential treatment options would include pregabalin or gabapentin.

## 2022-11-02 ENCOUNTER — Ambulatory Visit: Payer: Self-pay | Admitting: Critical Care Medicine

## 2022-11-03 ENCOUNTER — Encounter: Payer: Self-pay | Admitting: Critical Care Medicine

## 2022-11-03 ENCOUNTER — Ambulatory Visit: Payer: Self-pay | Attending: Critical Care Medicine | Admitting: Critical Care Medicine

## 2022-11-03 VITALS — BP 138/93 | HR 70 | Wt 214.2 lb

## 2022-11-03 DIAGNOSIS — Z79890 Hormone replacement therapy (postmenopausal): Secondary | ICD-10-CM | POA: Insufficient documentation

## 2022-11-03 DIAGNOSIS — Z79899 Other long term (current) drug therapy: Secondary | ICD-10-CM | POA: Insufficient documentation

## 2022-11-03 DIAGNOSIS — Z720 Tobacco use: Secondary | ICD-10-CM

## 2022-11-03 DIAGNOSIS — M7541 Impingement syndrome of right shoulder: Secondary | ICD-10-CM

## 2022-11-03 DIAGNOSIS — R21 Rash and other nonspecific skin eruption: Secondary | ICD-10-CM | POA: Insufficient documentation

## 2022-11-03 DIAGNOSIS — E782 Mixed hyperlipidemia: Secondary | ICD-10-CM | POA: Insufficient documentation

## 2022-11-03 DIAGNOSIS — I10 Essential (primary) hypertension: Secondary | ICD-10-CM | POA: Insufficient documentation

## 2022-11-03 DIAGNOSIS — Z1211 Encounter for screening for malignant neoplasm of colon: Secondary | ICD-10-CM | POA: Insufficient documentation

## 2022-11-03 DIAGNOSIS — Z8673 Personal history of transient ischemic attack (TIA), and cerebral infarction without residual deficits: Secondary | ICD-10-CM | POA: Insufficient documentation

## 2022-11-03 DIAGNOSIS — F17210 Nicotine dependence, cigarettes, uncomplicated: Secondary | ICD-10-CM | POA: Insufficient documentation

## 2022-11-03 DIAGNOSIS — S43431A Superior glenoid labrum lesion of right shoulder, initial encounter: Secondary | ICD-10-CM

## 2022-11-03 DIAGNOSIS — E890 Postprocedural hypothyroidism: Secondary | ICD-10-CM | POA: Insufficient documentation

## 2022-11-03 MED ORDER — ACETAMINOPHEN-CODEINE 300-30 MG PO TABS: 1.0000 | ORAL_TABLET | ORAL | 1 refills | Status: DC | PRN

## 2022-11-03 MED ORDER — ATORVASTATIN CALCIUM 40 MG PO TABS: 40.0000 mg | ORAL_TABLET | Freq: Every day | ORAL | 1 refills | Status: DC

## 2022-11-03 MED ORDER — CYCLOBENZAPRINE HCL 5 MG PO TABS
5.0000 mg | ORAL_TABLET | Freq: Every day | ORAL | 1 refills | Status: DC
Start: 2022-11-03 — End: 2022-11-04

## 2022-11-03 MED ORDER — LEVOTHYROXINE SODIUM 100 MCG PO TABS
100.0000 ug | ORAL_TABLET | Freq: Every day | ORAL | 1 refills | Status: DC
Start: 2022-11-03 — End: 2022-11-04

## 2022-11-03 MED ORDER — VALSARTAN 320 MG PO TABS: 320.0000 mg | ORAL_TABLET | Freq: Every day | ORAL | 1 refills | Status: DC

## 2022-11-03 MED ORDER — METOPROLOL SUCCINATE ER 50 MG PO TB24: 50.0000 mg | ORAL_TABLET | Freq: Every day | ORAL | 1 refills | Status: DC

## 2022-11-03 NOTE — Assessment & Plan Note (Signed)
Asked patient to get Medicaid application and see if she can get Medicaid and also process [place] discount  Give Tylenol 3 for now and Flexeril

## 2022-11-03 NOTE — Assessment & Plan Note (Signed)
As per impingement syndrome

## 2022-11-03 NOTE — Patient Instructions (Signed)
No change in medications except for increasing valsartan to 320 mg daily  All refill sent to your pharmacy [facility] today include cholesterol panel and you will be issued a fecal occult kit to check for colon cancer return with it processed  Focus on reducing your tobacco intake see attachment  Return to see Lurena Joiner our clinical pharmacist in 6 weeks Dr. Joya Gaskins in 2 to 3 months  Please process financial paperwork and we will make an appointment with financial counselor  Please look into getting Medicaid

## 2022-11-03 NOTE — Assessment & Plan Note (Signed)
Continue current dose of thyroid meds

## 2022-11-03 NOTE — Assessment & Plan Note (Addendum)
Patient has resumed cigarette use and one pack will last her 3 days. Attributes to increased stress and pain with right shoulder tendenopathy. Recommended nicotine replacement therapy     Current smoking consumption amount: 1 pack every few days  Dicsussion on advise to quit smoking and smoking impacts: cv impacts  Patient's willingness to quit:  Wants to quit  Methods to quit smoking discussed:  Nicotine replacment  Medication management of smoking session drugs discussed: nicotine patch    Setting quit date  Not established  Follow-up arranged  Dr Korayma Hagwood 3 months,   Time spent counseling the patient:  5 min   

## 2022-11-03 NOTE — Assessment & Plan Note (Signed)
Blood pressure poorly controlled will increase valsartan to 320 mg daily continue metoprolol get patient back with Lurena Joiner 6 weeks

## 2022-11-03 NOTE — Assessment & Plan Note (Signed)
Monitor for now with the use of skin moisturizer over-the-counter

## 2022-11-03 NOTE — Assessment & Plan Note (Signed)
Recheck lipids continue atorvastatin

## 2022-11-03 NOTE — Progress Notes (Signed)
Established Patient Office Visit  Subjective:  Patient ID: Meagan Flores, female    DOB: [birth year]  Age: 56 y.o. MRN: [id number]  CC:  Chief Complaint  Patient presents with   Hypertension    HPI 01/2022 Meagan Flores presents for primary care follow-up.  On arrival blood pressure is good 133/85.  Patient has undergone surgery of the right biceps tendon of the right shoulder due to tendinosis since I last saw her.  The surgery occurred in January and she is undergoing rehab now.  She still has some pain in the shoulder but is improving.  She has reduced her cigarette content down to 1 pack every week.  She has been visiting with our clinical social worker for counseling on depression and anxiety.  This has been beneficial to her.  She did have a viral type illness in January she did not get COVID tested.  This causes weakness fatigue and blurry vision.  She still has cloudiness to her vision in the left eye.  She does not see an eye doctor.  She is trying to apply for disability but has not yet been improved.  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  Patient has no other real complaints at this time.  She needs refills on all her medications.  She has been compliant with her blood pressure cholesterol and thyroid medicines.  She states the Temovate cream helped her rash on her arms.  7/3 This patient seen in return follow-up and she went to the emergency room on 27 June for abdominal pain and chest pain.  Extensive work-up done in the emergency room was negative.  No evidence of heart attack.  EKG chest x-ray is normal labs are normal.  Patient had mild elevation in blood pressure.  She received a GI cocktail symptoms went away.  She was given prescriptions for Carafate and omeprazole but she did not fill these as these were too expensive for her.  Patient's disability has not gone through  Patient's [employer] discount has not yet been approved  Patient needs  refills on all medications and prefers to use [facility] over our pharmacy.  Patient has upcoming appointments with orthopedics for further injections in the shoulder joint.  On arrival blood pressure is good 133/89.  She needs more cream for her eczema on the forearms.  12/6 Patient seen return follow-up for hypertension hypothyroidism hyperlipidemia prior stroke tobacco use  At the last visit her LDL was 172 and she was not taking atorvastatin she has been taking it since and needs refills.  She states the eczema on her hand is improved and she cannot afford the Temovate she is using skin moisturizer.  On arrival blood pressure elevated 138/93 despite being on valsartan 160 mg daily and metoprolol 50 mg daily.  She is smoking a pack of cigarettes every 3 days.  She also has severe tendinitis of the right shoulder she would benefit from an injection she went to rehab medicine however she cannot afford the $900 and she is uninsured.  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   COVID-19 virus infection 02/14/2020   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) tear of right shoulder 09/30/2020   Essential hypertension  Glenohumeral arthritis 12/27/2014   History of stroke    History of TIA (transient ischemic attack) 02/14/2020   Hyperlipidemia    Hypothyroidism    Impingement syndrome of right shoulder 12/24/2016   Mixed hyperlipidemia    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   Prediabetes    Preop cardiovascular exam 11/25/2020   Pulmonary hypertension, unspecified ([place]) 11/25/2020   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   Rotator cuff impingement syndrome of right shoulder 09/30/2020   Skin rash 02/17/2021   Stroke [facility])    Tendinopathy of right biceps tendon  09/30/2020   Thyroid disease    TIA (transient ischemic attack) 04/15/2018   Tobacco use 09/28/2016    Past Surgical History:  Procedure Laterality Date   BICEPT TENODESIS Right 12/16/2021   Procedure: RIGHT BICEPS TENODESIS;  Surgeon: Leandrew Koyanagi, MD;  Location: [place];  Service: Orthopedics;  Laterality: Right;   PARTIAL HYSTERECTOMY     right arm sx     SHOULDER ARTHROSCOPY WITH BICEPSTENOTOMY Right 01/21/2021   Procedure: SHOULDER ARTHROSCOPY WITH BICEPSTENOTOMY;  Surgeon: Leandrew Koyanagi, MD;  Location: [place];  Service: Orthopedics;  Laterality: Right;   SHOULDER ARTHROSCOPY WITH DISTAL CLAVICLE RESECTION Right 01/12/2017   Procedure: RIGHT SHOULDER ARTHROSCOPY WITH  SUBACROMIAL DECOMPRESSION, DISTAL CLAVICLE EXCISION, extensive debridement;  Surgeon: Leandrew Koyanagi, MD;  Location: [place];  Service: Orthopedics;  Laterality: Right;   THYMECTOMY  2004   THYROIDECTOMY      Family History  Problem Relation Age of Onset   Stomach cancer Sister    Colon polyps Sister    Esophageal cancer Neg Hx    Rectal cancer Neg Hx    Colon cancer Neg Hx    Breast cancer Neg Hx     Social History   Socioeconomic History   Marital status: Married    Spouse name: Not on file   Number of children: Not on file   Years of education: Not on file   Highest education level: Not on file  Occupational History   Not on file  Tobacco Use   Smoking status: Every Day    Packs/day: 0.50    Years: 34.00    Total pack years: 17.00    Types: Cigarettes   Smokeless tobacco: Never  Vaping Use   Vaping Use: Never used  Substance and Sexual Activity   Alcohol use: Yes    Comment: occassional   Drug use: No   Sexual activity: Not on file  Other Topics Concern   Not on file  Social History Narrative   Not on file   Social Determinants of Health   Financial Resource Strain: Not on file  Food Insecurity: Not on file  Transportation Needs: Not  on file  Physical Activity: Not on file  Stress: Not on file  Social Connections: Not on file  Intimate Partner Violence: Not on file    Outpatient Medications Prior to Visit  Medication Sig Dispense Refill   aspirin EC 81 MG tablet Take 1 tablet (81 mg total) by mouth daily. 60 tablet 3   acetaminophen-codeine (TYLENOL #3) 300-30 MG tablet Take 1 tablet by mouth 2 (two) times daily as needed for moderate pain. 30 tablet 0   atorvastatin (LIPITOR) 40 MG tablet Take 1 tablet (40 mg total) by mouth daily at 6 PM. 90 tablet 1   clobetasol cream (TEMOVATE) 0.93 % Apply 1 Application topically 2 (two) times daily. 30 g 3   cyclobenzaprine (FLEXERIL) 5 MG  tablet Take 1 tablet (5 mg total) by mouth at bedtime. 30 tablet 1   levothyroxine (EUTHYROX) 100 MCG tablet Take 1 tablet (100 mcg total) by mouth daily before breakfast. 90 tablet 1   meloxicam (MOBIC) 7.5 MG tablet Take 1 tablet (7.5 mg total) by mouth daily. 30 tablet 1   valsartan (DIOVAN) 160 MG tablet Take 1 tablet (160 mg total) by mouth daily. 60 tablet 4   metoprolol succinate (TOPROL-XL) 50 MG 24 hr tablet Take 1 tablet (50 mg total) by mouth daily. Take with or immediately following a meal. 90 tablet 1   No facility-administered medications prior to visit.    Allergies  Allergen Reactions   Sulfa Antibiotics Hives    ROS Review of Systems  Constitutional: Negative.   HENT: Negative.  Negative for ear pain, postnasal drip, rhinorrhea, sinus pressure, sore throat, trouble swallowing and voice change.   Eyes:  Negative for visual disturbance.  Respiratory: Negative.  Negative for apnea, cough, choking, chest tightness, shortness of breath, wheezing and stridor.   Cardiovascular: Negative.  Negative for chest pain, palpitations and leg swelling.  Gastrointestinal: Negative.  Negative for abdominal distention, abdominal pain, nausea and vomiting.  Genitourinary: Negative.   Musculoskeletal:  Positive for joint swelling. Negative  for arthralgias and myalgias.       Right shoulder pain  Skin:  Positive for rash.  Allergic/Immunologic: Negative.  Negative for environmental allergies and food allergies.  Neurological: Negative.  Negative for dizziness, syncope, weakness and headaches.  Hematological: Negative.  Negative for adenopathy. Does not bruise/bleed easily.  Psychiatric/Behavioral: Negative.  Negative for agitation and sleep disturbance. The patient is not nervous/anxious.       Objective:    Physical Exam Vitals reviewed.  Constitutional:      Appearance: Normal appearance. She is well-developed. She is obese. She is not diaphoretic.  HENT:     Head: Normocephalic and atraumatic.     Nose: Nose normal. No nasal deformity, septal deviation, mucosal edema or rhinorrhea.     Right Sinus: No maxillary sinus tenderness or frontal sinus tenderness.     Left Sinus: No maxillary sinus tenderness or frontal sinus tenderness.     Mouth/Throat:     Mouth: Mucous membranes are moist.     Pharynx: Oropharynx is clear. No oropharyngeal exudate.  Eyes:     General: Lids are normal. Vision grossly intact. Gaze aligned appropriately. No allergic shiner, visual field deficit or scleral icterus.       Right eye: No foreign body, discharge or hordeolum.        Left eye: No foreign body, discharge or hordeolum.     Extraocular Movements: Extraocular movements intact.     Conjunctiva/sclera: Conjunctivae normal.     Right eye: Right conjunctiva is not injected. No chemosis, exudate or hemorrhage.    Left eye: Left conjunctiva is not injected. No chemosis, exudate or hemorrhage.    Pupils: Pupils are equal, round, and reactive to light.  Neck:     Thyroid: No thyromegaly.     Vascular: No carotid bruit or JVD.     Trachea: Trachea normal. No tracheal tenderness or tracheal deviation.  Cardiovascular:     Rate and Rhythm: Normal rate and regular rhythm.     Chest Wall: PMI is not displaced.     Pulses: Normal pulses. No  decreased pulses.     Heart sounds: Normal heart sounds, S1 normal and S2 normal. Heart sounds not distant. No murmur heard.  No systolic murmur is present.     No diastolic murmur is present.     No friction rub. No gallop. No S3 or S4 sounds.  Pulmonary:     Effort: No tachypnea, accessory muscle usage or respiratory distress.     Breath sounds: No stridor. No decreased breath sounds, wheezing, rhonchi or rales.  Chest:     Chest wall: No tenderness.  Abdominal:     General: Bowel sounds are normal. There is no distension.     Palpations: Abdomen is soft. Abdomen is not rigid.     Tenderness: There is no abdominal tenderness. There is no guarding or rebound.  Musculoskeletal:        General: Tenderness present.     Cervical back: Normal range of motion and neck supple. No edema, erythema or rigidity. No muscular tenderness. Normal range of motion.     Comments: Decreased range of motion right shoulder  Lymphadenopathy:     Head:     Right side of head: No submental or submandibular adenopathy.     Left side of head: No submental or submandibular adenopathy.     Cervical: No cervical adenopathy.  Skin:    General: Skin is warm and dry.     Coloration: Skin is not pale.     Findings: Rash present.     Nails: There is no clubbing.     Comments: Mild rash left forearm plantar surface consistent with eczema but is improved  Neurological:     Mental Status: She is alert and oriented to person, place, and time.     Sensory: No sensory deficit.  Psychiatric:        Speech: Speech normal.        Behavior: Behavior normal.     BP (!) 138/93   Pulse 70   Wt 214 lb 3.2 oz (97.2 kg)   SpO2 94%   BMI 32.57 kg/m  Wt Readings from Last 3 Encounters:  11/03/22 214 lb 3.2 oz (97.2 kg)  10/19/22 212 lb (96.2 kg)  09/07/22 214 lb 9.6 oz (97.3 kg)     Health Maintenance Due  Topic Date Due   COLONOSCOPY (Pts 45-74yr Insurance coverage will need to be confirmed)  09/02/2022     There are no preventive care reminders to display for this patient.  Lab Results  Component Value Date   TSH 6.990 (H) 06/29/2021   Lab Results  Component Value Date   WBC 7.6 05/25/2022   HGB 12.7 05/25/2022   HCT 39.6 05/25/2022   MCV 76.6 (L) 05/25/2022   PLT 216 05/25/2022   Lab Results  Component Value Date   NA 139 05/25/2022   K 3.9 05/25/2022   CO2 23 05/25/2022   GLUCOSE 119 (H) 05/25/2022   BUN 11 05/25/2022   CREATININE 0.78 05/25/2022   BILITOT 0.6 05/25/2022   ALKPHOS 90 05/25/2022   AST 24 05/25/2022   ALT 17 05/25/2022   PROT 6.6 05/25/2022   ALBUMIN 3.7 05/25/2022   CALCIUM 8.4 (L) 05/25/2022   ANIONGAP 10 05/25/2022   EGFR 86 01/27/2022   Lab Results  Component Value Date   CHOL 243 (H) 05/31/2022   Lab Results  Component Value Date   HDL 56 05/31/2022   Lab Results  Component Value Date   LDLCALC 172 (H) 05/31/2022   Lab Results  Component Value Date   TRIG 85 05/31/2022   Lab Results  Component Value Date   CHOLHDL 4.3 05/31/2022  Lab Results  Component Value Date   HGBA1C 5.8 (A) 10/21/2020      Assessment & Plan:   Problem List Items Addressed This Visit       Cardiovascular and Mediastinum   Essential hypertension    Blood pressure poorly controlled will increase valsartan to 320 mg daily continue metoprolol get patient back with Lurena Joiner 6 weeks      Relevant Medications   atorvastatin (LIPITOR) 40 MG tablet   metoprolol succinate (TOPROL-XL) 50 MG 24 hr tablet   valsartan (DIOVAN) 320 MG tablet     Endocrine   Postoperative hypothyroidism    Continue current dose of thyroid meds      Relevant Medications   levothyroxine (EUTHYROX) 100 MCG tablet   metoprolol succinate (TOPROL-XL) 50 MG 24 hr tablet     Musculoskeletal and Integument   Impingement syndrome of right shoulder    Asked patient to get Medicaid application and see if she can get Medicaid and also process [employer] discount  Give Tylenol 3 for  now and Flexeril      Relevant Medications   acetaminophen-codeine (TYLENOL #3) 300-30 MG tablet   cyclobenzaprine (FLEXERIL) 5 MG tablet   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 Attributes to increased stress and pain with right shoulder tendenopathy. Recommended nicotine replacement therapy     Current smoking consumption amount: 1 pack every few days  Dicsussion on advise to quit smoking and smoking impacts: cv impacts  Patient's willingness to quit:  Wants to quit  Methods to quit smoking discussed:  Nicotine replacment  Medication management of smoking session drugs discussed: nicotine patch    Setting quit date  Not established  Follow-up arranged  Dr Joya Gaskins 3 months,   Time spent counseling the patient:  5 min        History of TIA (transient ischemic attack)   Relevant Medications   atorvastatin (LIPITOR) 40 MG tablet   Other Visit Diagnoses     Colon cancer screening       Relevant Orders   Fecal occult blood, imunochemical      Meds ordered this encounter  Medications   acetaminophen-codeine (TYLENOL #3) 300-30 MG tablet    Sig: Take 1 tablet by mouth every 4 (four) hours as needed for moderate pain.    Dispense:  60 tablet    Refill:  1   atorvastatin (LIPITOR) 40 MG tablet    Sig: Take 1 tablet (40 mg total) by mouth daily at 6 PM.    Dispense:  90 tablet    Refill:  1   cyclobenzaprine (FLEXERIL) 5 MG tablet    Sig:  Take 1 tablet (5 mg total) by mouth at bedtime.    Dispense:  30 tablet    Refill:  1   levothyroxine (EUTHYROX) 100 MCG tablet    Sig: Take  1 tablet (100 mcg total) by mouth daily before breakfast.    Dispense:  90 tablet    Refill:  1   metoprolol succinate (TOPROL-XL) 50 MG 24 hr tablet    Sig: Take 1 tablet (50 mg total) by mouth daily. Take with or immediately following a meal.    Dispense:  90 tablet    Refill:  1   valsartan (DIOVAN) 320 MG tablet    Sig: Take 1 tablet (320 mg total) by mouth daily.    Dispense:  90 tablet    Refill:  1    Dose change    Follow-up: Return in about 3 months (around 02/02/2023) for htn.    Asencion Noble, MD

## 2022-11-04 ENCOUNTER — Other Ambulatory Visit: Payer: Self-pay

## 2022-11-04 ENCOUNTER — Telehealth: Payer: Self-pay

## 2022-11-04 DIAGNOSIS — Z8673 Personal history of transient ischemic attack (TIA), and cerebral infarction without residual deficits: Secondary | ICD-10-CM

## 2022-11-04 LAB — LIPID PANEL
Chol/HDL Ratio: 4.5 ratio — ABNORMAL HIGH (ref 0.0–4.4)
Cholesterol, Total: 239 mg/dL — ABNORMAL HIGH (ref 100–199)
HDL: 53 mg/dL (ref >39)
LDL Chol Calc (NIH): 168 mg/dL — ABNORMAL HIGH (ref 0–99)
Triglycerides: 104 mg/dL (ref 0–149)
VLDL Cholesterol Cal: 18 mg/dL (ref 5–40)

## 2022-11-04 MED ORDER — LEVOTHYROXINE SODIUM 100 MCG PO TABS
100.0000 ug | ORAL_TABLET | Freq: Every day | ORAL | 1 refills | Status: DC
Start: 2022-11-04 — End: 2023-02-03

## 2022-11-04 MED ORDER — CYCLOBENZAPRINE HCL 5 MG PO TABS: 5.0000 mg | ORAL_TABLET | Freq: Every day | ORAL | 1 refills | Status: DC

## 2022-11-04 MED ORDER — ATORVASTATIN CALCIUM 40 MG PO TABS: 40.0000 mg | ORAL_TABLET | Freq: Every day | ORAL | 1 refills | Status: DC

## 2022-11-04 MED ORDER — VALSARTAN 320 MG PO TABS
320.0000 mg | ORAL_TABLET | Freq: Every day | ORAL | 1 refills | Status: DC
Start: 2022-11-04 — End: 2022-12-14

## 2022-11-04 MED ORDER — ACETAMINOPHEN-CODEINE 300-30 MG PO TABS: 1.0000 | ORAL_TABLET | ORAL | 1 refills | Status: DC | PRN

## 2022-11-04 MED ORDER — METOPROLOL SUCCINATE ER 50 MG PO TB24: 50.0000 mg | ORAL_TABLET | Freq: Every day | ORAL | 1 refills | Status: DC

## 2022-11-04 NOTE — Telephone Encounter (Signed)
Pt was called and is aware of results, DOB was confirmed.  ?

## 2022-11-04 NOTE — Addendum Note (Signed)
Addended by: Asencion Noble E on: 11/04/2022 02:12 PM   Modules accepted: Orders

## 2022-11-04 NOTE — Telephone Encounter (Signed)
Called patient and she is aware number to our pharmacy was given

## 2022-11-04 NOTE — Telephone Encounter (Signed)
Meds resent I thought that would happened and she decliine previously

## 2022-11-04 NOTE — Progress Notes (Signed)
Let patient know her cholesterol remains quite elevated make sure he is she is staying on the atorvastatin for the cholesterol management

## 2022-11-04 NOTE — Telephone Encounter (Signed)
-----   Message from Elsie Stain, MD sent at 11/04/2022  6:04 AM EST ----- Let patient know her cholesterol remains quite elevated make sure he is she is staying on the atorvastatin for the cholesterol management

## 2022-12-02 ENCOUNTER — Encounter: Payer: Commercial Managed Care - HMO | Admitting: Physical Medicine & Rehabilitation

## 2022-12-02 LAB — FECAL OCCULT BLOOD, IMMUNOCHEMICAL: Fecal Occult Bld: NEGATIVE

## 2022-12-02 NOTE — Progress Notes (Signed)
Let pt know colon cancer test negative no cancer recheck one year

## 2022-12-03 ENCOUNTER — Telehealth: Payer: Self-pay

## 2022-12-03 NOTE — Telephone Encounter (Signed)
Pt was called and is aware of results, DOB was confirmed.  ?

## 2022-12-03 NOTE — Telephone Encounter (Signed)
-----   Message from Elsie Stain, MD sent at 12/02/2022  4:39 PM EST ----- Let pt know colon cancer test negative no cancer recheck one year

## 2022-12-09 ENCOUNTER — Encounter
Payer: Commercial Managed Care - HMO | Attending: Physical Medicine & Rehabilitation | Admitting: Physical Medicine & Rehabilitation

## 2022-12-09 ENCOUNTER — Encounter: Payer: Self-pay | Admitting: Physical Medicine & Rehabilitation

## 2022-12-09 VITALS — BP 105/73 | HR 82 | Ht 68.0 in | Wt 211.0 lb

## 2022-12-09 DIAGNOSIS — M25511 Pain in right shoulder: Secondary | ICD-10-CM | POA: Diagnosis not present

## 2022-12-09 DIAGNOSIS — G8929 Other chronic pain: Secondary | ICD-10-CM | POA: Insufficient documentation

## 2022-12-09 MED ORDER — BETAMETHASONE SOD PHOS & ACET 6 (3-3) MG/ML IJ SUSP
12.0000 mg | Freq: Once | INTRAMUSCULAR | Status: AC
  Administered 2022-12-09: 12 mg via INTRAMUSCULAR

## 2022-12-09 MED ORDER — LIDOCAINE HCL (PF) 1 % IJ SOLN: 4.0000 mL | Freq: Once | INTRAMUSCULAR | Status: DC

## 2022-12-09 MED ORDER — LIDOCAINE HCL 1 % IJ SOLN: 5.0000 mL | Freq: Once | INTRAMUSCULAR | Status: DC

## 2022-12-09 NOTE — Progress Notes (Signed)
Indication chronic shoulder pain that is not responsive to medication management and other conservative care  Pain is severe and interferes with activities  Informed consent was obtained after describing risks and benefits of the procedure including bleeding bruising and infection. She elects to proceed and has given written consent. Patient placed in a seated position medial to lateral approach utilized. Linear transducer placed in oblique coronal plane. Suprascapular notch identified. Needle inserted in plane medial to lateral. Once target was reached with needle tip, a solution containing one ML 6 ML per cc betamethasone and 4 mL 0.25% Marcaine were injected. Patient tolerated procedure well. Post procedure instructions given

## 2022-12-09 NOTE — Patient Instructions (Signed)
RIght suprascapular nerve block performed today

## 2022-12-14 ENCOUNTER — Encounter: Payer: Self-pay | Admitting: Cardiology

## 2022-12-14 ENCOUNTER — Encounter: Payer: Self-pay | Admitting: Gastroenterology

## 2022-12-14 ENCOUNTER — Ambulatory Visit: Payer: Commercial Managed Care - HMO | Attending: Cardiology | Admitting: Cardiology

## 2022-12-14 VITALS — BP 142/86 | HR 74 | Ht 68.0 in | Wt 211.1 lb

## 2022-12-14 DIAGNOSIS — I10 Essential (primary) hypertension: Secondary | ICD-10-CM

## 2022-12-14 DIAGNOSIS — I272 Pulmonary hypertension, unspecified: Secondary | ICD-10-CM | POA: Diagnosis not present

## 2022-12-14 DIAGNOSIS — E782 Mixed hyperlipidemia: Secondary | ICD-10-CM | POA: Diagnosis not present

## 2022-12-14 DIAGNOSIS — Z720 Tobacco use: Secondary | ICD-10-CM

## 2022-12-14 DIAGNOSIS — Z8673 Personal history of transient ischemic attack (TIA), and cerebral infarction without residual deficits: Secondary | ICD-10-CM | POA: Diagnosis not present

## 2022-12-14 MED ORDER — METOPROLOL SUCCINATE ER 50 MG PO TB24: 50.0000 mg | ORAL_TABLET | Freq: Every day | ORAL | 3 refills | Status: DC

## 2022-12-14 MED ORDER — ATORVASTATIN CALCIUM 40 MG PO TABS: 40.0000 mg | ORAL_TABLET | Freq: Every day | ORAL | 3 refills | Status: DC

## 2022-12-14 MED ORDER — VALSARTAN 320 MG PO TABS: 320.0000 mg | ORAL_TABLET | Freq: Every day | ORAL | 3 refills | Status: DC

## 2022-12-14 NOTE — Progress Notes (Signed)
Cardiology Office Note:    Date:  12/14/2022   ID:  Meagan Flores, DOB March 21, 1966, MRN [id number]  PCP:  Elsie Stain, MD  Cardiologist:  Jenne Campus, MD    Referring MD: Elsie Stain, MD   No chief complaint on file. Having cold-like symptoms  History of Present Illness:    Meagan Flores is a 57 y.o. female with a past medical history significant for questionable pulmonary hypertension.  She was noted to have enlargement of the pulmonary artery on CT, however 2 echocardiograms did not show any pulmonary hypertension.  Additional problem include essential hypertension, dyslipidemia, noncompliance.  She is coming today to my office for follow-up not feeling well she does have cold-like symptoms.  She also admits that she does not take Lipitor on the regular basis.  She said she ran out of it and she could not afford it.  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   Prediabetes    Preop cardiovascular exam 11/25/2020   Pulmonary hypertension, unspecified ([place]) 11/25/2020   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   Rotator cuff impingement syndrome of right shoulder 09/30/2020   Skin rash 02/17/2021   Stroke [facility])    Tendinopathy of right biceps tendon 09/30/2020   Thyroid disease    TIA (transient ischemic attack) 04/15/2018   Tobacco use 09/28/2016    Past Surgical History:  Procedure Laterality Date   BICEPT TENODESIS Right 12/16/2021    Procedure: RIGHT BICEPS TENODESIS;  Surgeon: Leandrew Koyanagi, MD;  Location: [place];  Service: Orthopedics;  Laterality: Right;   PARTIAL HYSTERECTOMY     right arm sx     SHOULDER ARTHROSCOPY WITH BICEPSTENOTOMY Right 01/21/2021   Procedure: SHOULDER ARTHROSCOPY WITH BICEPSTENOTOMY;  Surgeon: Leandrew Koyanagi, MD;  Location: [place];  Service: Orthopedics;  Laterality: Right;   SHOULDER ARTHROSCOPY WITH DISTAL CLAVICLE RESECTION Right 01/12/2017   Procedure: RIGHT SHOULDER ARTHROSCOPY WITH  SUBACROMIAL DECOMPRESSION, DISTAL CLAVICLE EXCISION, extensive debridement;  Surgeon: Leandrew Koyanagi, MD;  Location: [place];  Service: Orthopedics;  Laterality: Right;   THYMECTOMY  2004   THYROIDECTOMY      Current Medications: Current Meds  Medication Sig   acetaminophen-codeine (TYLENOL #3) 300-30 MG tablet Take 1 tablet by mouth every 4 (four) hours as needed for moderate pain.   aspirin EC 81 MG tablet Take 1 tablet (81 mg total) by mouth daily.   atorvastatin (LIPITOR) 40 MG tablet Take 1 tablet (40 mg total) by mouth daily at 6 PM.   cyclobenzaprine (FLEXERIL) 5 MG tablet Take 1 tablet (5 mg total) by mouth at bedtime.   levothyroxine (EUTHYROX) 100 MCG tablet Take 1 tablet (100 mcg total) by mouth daily before breakfast.   metoprolol succinate (TOPROL-XL) 50 MG 24 hr tablet Take 1 tablet (50 mg total) by mouth daily. Take with or immediately following a meal.   valsartan (DIOVAN) 320 MG tablet Take 1 tablet (320 mg total) by  mouth daily.     Allergies:   Sulfa antibiotics   Social History   Socioeconomic History   Marital status: Married    Spouse name: Not on file   Number of children: Not on file   Years of education: Not on file   Highest education level: Not on file  Occupational History   Not on file  Tobacco Use   Smoking status: Every Day    Packs/day: 0.50    Years: 34.00    Total pack years: 17.00    Types: Cigarettes    Smokeless tobacco: Never  Vaping Use   Vaping Use: Never used  Substance and Sexual Activity   Alcohol use: Yes    Comment: occassional   Drug use: No   Sexual activity: Not on file  Other Topics Concern   Not on file  Social History Narrative   Not on file   Social Determinants of Health   Financial Resource Strain: Not on file  Food Insecurity: Not on file  Transportation Needs: Not on file  Physical Activity: Not on file  Stress: Not on file  Social Connections: Not on file     Family History: The patient's family history includes Colon polyps in her sister; Stomach cancer in her sister. There is no history of Esophageal cancer, Rectal cancer, Colon cancer, or Breast cancer. ROS:   Please see the history of present illness.    All 14 point review of systems negative except as described per history of present illness  EKGs/Labs/Other Studies Reviewed:      Recent Labs: 05/25/2022: ALT 17; BUN 11; Creatinine, Ser 0.78; Hemoglobin 12.7; Platelets 216; Potassium 3.9; Sodium 139  Recent Lipid Panel    Component Value Date/Time   CHOL 239 (H) 11/03/2022 0907   TRIG 104 11/03/2022 0907   HDL 53 11/03/2022 0907   CHOLHDL 4.5 (H) 11/03/2022 0907   CHOLHDL 4.5 04/15/2018 0058   VLDL 28 04/15/2018 0058   LDLCALC 168 (H) 11/03/2022 0907    Physical Exam:    VS:  BP (!) 142/86 (BP Location: Left Arm, Patient Position: Sitting)   Pulse 74   Ht '5\' 8"'$  (1.727 m)   Wt 211 lb 1.6 oz (95.8 kg)   SpO2 97%   BMI 32.10 kg/m     Wt Readings from Last 3 Encounters:  12/14/22 211 lb 1.6 oz (95.8 kg)  12/09/22 211 lb (95.7 kg)  11/03/22 214 lb 3.2 oz (97.2 kg)     GEN:  Well nourished, well developed in no acute distress HEENT: Normal NECK: No JVD; No carotid bruits LYMPHATICS: No lymphadenopathy CARDIAC: RRR, no murmurs, no rubs, no gallops RESPIRATORY:  Clear to auscultation without rales, wheezing or rhonchi  ABDOMEN: Soft, non-tender, non-distended MUSCULOSKELETAL:   No edema; No deformity  SKIN: Warm and dry LOWER EXTREMITIES: no swelling NEUROLOGIC:  Alert and oriented x 3 PSYCHIATRIC:  Normal affect   ASSESSMENT:    1. Pulmonary hypertension, unspecified ([place])   2. Essential hypertension   3. History of TIA (transient ischemic attack)   4. Mixed hyperlipidemia   5. Tobacco use    PLAN:    In order of problems listed above:  Pulmonary hypertension doubtful diagnosis echocardiogram done twice did not show any tricuspid regurgitation, therefore, chance of having significant pulmonary hypertension is very slim to none. Essential hypertension blood pressure elevated today but she is sick today.  Will continue monitoring. Mixed dyslipidemia, I did review K PN LDL 168 HDL 53 clearly  not well-controlled but she admits that she very rarely takes Lipitor laterally were able to advise her that she can get Lipitor at [facility] for $15 hopefully she will be able to afford and start using it.  If that is the case we will check her fasting lipid profile in about 6 weeks. Smoking still ongoing.  She understands the problem hopefully she will be able to quit and she was instructed to do so   Medication Adjustments/Labs and Tests Ordered: Current medicines are reviewed at length with the patient today.  Concerns regarding medicines are outlined above.  No orders of the defined types were placed in this encounter.  Medication changes: No orders of the defined types were placed in this encounter.   Signed, Park Liter, MD, [facility] 12/14/2022 2:34 PM    [place]

## 2022-12-14 NOTE — Patient Instructions (Addendum)

## 2022-12-16 ENCOUNTER — Ambulatory Visit: Payer: Self-pay

## 2022-12-16 ENCOUNTER — Ambulatory Visit: Payer: Commercial Managed Care - HMO | Attending: Critical Care Medicine | Admitting: Pharmacist

## 2022-12-16 ENCOUNTER — Telehealth: Payer: Self-pay | Admitting: Emergency Medicine

## 2022-12-16 VITALS — BP 121/84

## 2022-12-16 DIAGNOSIS — I10 Essential (primary) hypertension: Secondary | ICD-10-CM

## 2022-12-16 MED ORDER — VALSARTAN 160 MG PO TABS: 160.0000 mg | ORAL_TABLET | Freq: Every day | ORAL | 0 refills | Status: DC

## 2022-12-16 NOTE — Telephone Encounter (Signed)
PCE team called and clarified.

## 2022-12-16 NOTE — Telephone Encounter (Signed)
Message from Bayard Beaver sent at 12/16/2022 10:16 AM EST  Summary: med ?   Hui from [facility] [facility] called in says received 2 prescriptions of valsartan, one was from a different Dr.  Deborha Payment have valsartan 160 mg and then one for '320mg'$ . Please call back          Chief Complaint: verify Valsartan dosage  Pertinent Negatives: Patient denies n/a Disposition: '[]'$ ED /'[]'$ Urgent Care (no appt availability in office) / '[]'$ Appointment(In office/virtual)/ '[]'$  [facility]/ '[x]'$ Home Care/ '[]'$ Refused Recommended Disposition /'[]'$ [place] [facility]/ '[]'$  Follow-up with PCP Additional Notes: called [facility] and spoke to [place] Per pharmacist note dose changed to 160 mg. Hui verbalized understanding.   Reason for Disposition  Caller has medicine question only, adult not sick, AND triager answers question  Answer Assessment - Initial Assessment Questions 1. NAME of MEDICINE: "What medicine(s) are you calling about?"     Valsartan 2. QUESTION: "What is your question?" (e.g., double dose of medicine, side effect)     Hui from [place] wanting to verify Valsartan dose of 160 mg  3. PRESCRIBER: "Who prescribed the medicine?" Reason: if prescribed by specialist, call should be referred to that group.     PCP 4. SYMPTOMS: "Do you have any symptoms?" If Yes, ask: "What symptoms are you having?"  "How bad are the symptoms (e.g., mild, moderate, severe)     N/a 5. PREGNANCY:  "Is there any chance that you are pregnant?" "When was your last menstrual period?"     N/a  Protocols used: Medication Question Call-A-AH

## 2022-12-16 NOTE — Telephone Encounter (Signed)
Copied from [place] [phone]. Topic: General - Other >> Dec 16, 2022 10:05 AM Sabas Sous wrote: Reason for CRM: Pharmacy called, they need verification for the Rx for Valsartan. They received a Rx from [facility] for the same medication with different directions two days ago.

## 2022-12-16 NOTE — Progress Notes (Signed)
S:     No chief complaint on file.  57 y.o. female who presents for hypertension evaluation, education, and management.  PMH is significant for hypertension, TIA, and stroke.  Patient was referred and last seen by Primary Care Provider, Dr. Joya Gaskins, on 11/03/2022.   Patient was last seen by Cardiology, Dr. Agustin Cree on 12/14/2022. At last visit, BP was 142/86.   BP at physical medicine and rehabilitation was 105/73 on 12/09/22.  Today, patient arrives in good spirits and presents without  assistance. Denies dizziness, headache, blurred vision, swelling. Patient reports hypertension was diagnosed in 2020.   Social history:  Tobacco: current 0.5 PPD smoker Alcohol: patient reports that she hasn't consumed any beer since her birthday in [birth year].  Family Hx: Mother diagnosed with HTN.  Patient admits to medication non-compliance. States she takes "only valsartan maybe two times each week." Last dose of valsartan was taken on 12/13/2022. Patient states metoprolol made her heart race and stopped taking it. Patient has not taken BP medications today.   Current antihypertensives include: Metoprolol succinate 50 mg daily, valsartan 320 mg daily  Antihypertensives tried in the past include: N/A  Reported home BP readings: Patient states her daughter takes her blood pressure at home and says "it's always fine." Patient does not recall any specific measurements.  Patient reported dietary habits: Patient endorses decreasing sodium intake as well as decreasing fried foods.  Patient-reported exercise habits: Patient states she does not currently exercise but is looking to start exercising in the future.  ASCVD risk factors include: Patient currently smokes, but states she is trying to quit.  O:  Vitals:   12/16/22 0912  BP: 121/84   Last 3 Office BP readings: BP Readings from Last 3 Encounters:  12/14/22 (!) 142/86  12/09/22 105/73  11/03/22 (!) 138/93   BMET    Component Value  Date/Time   NA 139 05/25/2022 2243   NA 145 (H) 01/27/2022 0905   K 3.9 05/25/2022 2243   CL 106 05/25/2022 2243   CO2 23 05/25/2022 2243   GLUCOSE 119 (H) 05/25/2022 2243   BUN 11 05/25/2022 2243   BUN 9 01/27/2022 0905   CREATININE 0.78 05/25/2022 2243   CREATININE 0.69 01/29/2014 1550   CALCIUM 8.4 (L) 05/25/2022 2243   GFRNONAA >60 05/25/2022 2243   GFRNONAA >89 01/29/2014 1550   GFRAA >60 06/25/2020 0803   GFRAA >89 01/29/2014 1550   Renal function: CrCl cannot be calculated (Patient's most recent lab result is older than the maximum 21 days allowed.).  Clinical ASCVD: Yes   A/P: Hypertension diagnosed in 2020 currently 121/84 on current medications. BP goal < 130/80  mmHg. Medication adherence appears to be suboptimal. Control is suboptimal due to non-adherence.  -Discontinued valsartan 320 mg. Started valsartan 160 mg daily.  - Patient states she has only been taking valsartan 320 mg twice weekly. Blood pressure was slightly above goal, however did have a reading of 105/73 at her appointment with physical medicine. -Patient educated on purpose, proper use, and potential adverse effects of non-compliance.  -F/u labs ordered - N/A - Counseled patient on importance of compliance due to history of stroke. -Counseled on lifestyle modifications for blood pressure control including reduced dietary sodium, increased exercise, adequate sleep. -Encouraged patient to check BP at home and bring log of readings to next visit. Counseled on proper use of home BP cuff.    Results reviewed and written information provided.    Written patient instructions provided. Patient verbalized understanding of  treatment plan.  Total time in face to face counseling 30 minutes.    Follow-up:  Pharmacist in 1 month. PCP clinic visit in Dr. Joya Gaskins on 02/03/2023.   Patient seen with: Lillard Anes, PharmD Candidate [employer]  Class of [employer]26  Benard Halsted, PharmD, [place], [place] [phone]

## 2022-12-23 ENCOUNTER — Telehealth: Payer: Self-pay | Admitting: Physical Medicine & Rehabilitation

## 2022-12-23 NOTE — Telephone Encounter (Signed)
Patient called and left a message 1/25 @ 11:29am, states she had a nerve block 1/11 and now having a lot of arm pain and requesting a call back

## 2023-01-04 ENCOUNTER — Telehealth: Payer: Commercial Managed Care - HMO | Admitting: Critical Care Medicine

## 2023-01-04 DIAGNOSIS — Z1231 Encounter for screening mammogram for malignant neoplasm of breast: Secondary | ICD-10-CM

## 2023-01-04 NOTE — Telephone Encounter (Signed)
Pt due a mammogram this was ordered she has insurance

## 2023-01-05 NOTE — Telephone Encounter (Signed)
Snapshot has been placed in folder and will be called for appointment

## 2023-01-14 ENCOUNTER — Encounter: Payer: Self-pay | Admitting: Pharmacist

## 2023-01-14 ENCOUNTER — Ambulatory Visit: Payer: Commercial Managed Care - HMO | Attending: Critical Care Medicine | Admitting: Pharmacist

## 2023-01-14 VITALS — BP 123/82 | HR 64

## 2023-01-14 DIAGNOSIS — I10 Essential (primary) hypertension: Secondary | ICD-10-CM

## 2023-01-14 NOTE — Progress Notes (Signed)
S:     No chief complaint on file.  57 y.o. female who presents for hypertension evaluation, education, and management.  PMH is significant for stroke, mixed hyperlipidemia, HTN.  Patient was referred and last seen by Primary Care Provider, Dr. Joya Gaskins, on 11/03/22.   Last saw pharmacy on 12/16/22 and stated she had only been taking her valsartan a few times per week and had d/c her metoprolol. BP was 121/84.  Today, patient arrives in good spirits and presents without assistance. Denies dizziness, headache, blurred vision, swelling.   Patient reports hypertension was diagnosed in 2023.   Family history: HTN Social history: Smokes 0.5 PPD.  Medication adherence appears sub-optimal. States she only takes valsartan a couple times per week. Patient has not taken BP medications today.   Current antihypertensives include: valsartan 160 mg daily  Antihypertensives tried in the past include: metoprolol 50 mg daily  Reported home BP readings: states her daughter takes her blood pressure at home, but could not recall any numbers.  Patient reported dietary habits: Endorses decreasing sodium intake and fried foods.  Patient-reported exercise habits: States she has not been able to exercise, but would like to start in the future.  O:  Vitals:   01/14/23 0900  BP: 123/82  Pulse: 64   Last 3 Office BP readings: BP Readings from Last 3 Encounters:  12/16/22 121/84  12/14/22 (!) 142/86  12/09/22 105/73   BMET    Component Value Date/Time   NA 139 05/25/2022 2243   NA 145 (H) 01/27/2022 0905   K 3.9 05/25/2022 2243   CL 106 05/25/2022 2243   CO2 23 05/25/2022 2243   GLUCOSE 119 (H) 05/25/2022 2243   BUN 11 05/25/2022 2243   BUN 9 01/27/2022 0905   CREATININE 0.78 05/25/2022 2243   CREATININE 0.69 01/29/2014 1550   CALCIUM 8.4 (L) 05/25/2022 2243   GFRNONAA >60 05/25/2022 2243   GFRNONAA >89 01/29/2014 1550   GFRAA >60 06/25/2020 0803   GFRAA >89 01/29/2014 1550    Renal  function: CrCl cannot be calculated (Patient's most recent lab result is older than the maximum 21 days allowed.).  Clinical ASCVD: Yes  The 10-year ASCVD risk score (Arnett DK, et al., 2019) is: 11.1%   Values used to calculate the score:     Age: 89 years     Sex: Female     Is Non-Hispanic African American: Yes     Diabetic: No     Tobacco smoker: Yes     Systolic Blood Pressure: 123XX123 mmHg     Is BP treated: Yes     HDL Cholesterol: 53 mg/dL     Total Cholesterol: 239 mg/dL  Patient is participating in a Managed Medicaid Plan: No    A/P: Hypertension diagnosed in 2023 currently controlled on current medications. BP goal < 130/80 mmHg. Medication adherence appears sub-optimal.  -Continued valsartan 160 mg daily. - Patient stated she will still take her medication a few times weekly, discussed importance of adherence to atorvastatin due to stroke history. States she will "focus on taking her atorvastatin" -Patient educated on purpose, proper use, and potential adverse effects of atorvastatin. -Counseled on lifestyle modifications for blood pressure control including reduced dietary sodium, increased exercise, adequate sleep. -Encouraged patient to check BP at home and bring log of readings to next visit. Counseled on proper use of home BP cuff.   Results reviewed and written information provided.    Written patient instructions provided. Patient verbalized understanding of  treatment plan.  Total time in face to face counseling 30 minutes.    Follow-up:  PCP clinic visit on 02/03/23.   Patient seen with Lillard Anes PharmD candidate co [employer]  Benard Halsted, PharmD, [place], [place] [phone]

## 2023-01-19 ENCOUNTER — Other Ambulatory Visit: Payer: Self-pay

## 2023-01-19 ENCOUNTER — Emergency Department (HOSPITAL_COMMUNITY)
Admission: EM | Admit: 2023-01-19 | Discharge: 2023-01-20 | Disposition: A | Discharge status: Home / Self Care | Payer: Commercial Managed Care - HMO | Attending: Emergency Medicine | Admitting: Emergency Medicine

## 2023-01-19 ENCOUNTER — Emergency Department (HOSPITAL_COMMUNITY): Payer: Commercial Managed Care - HMO

## 2023-01-19 DIAGNOSIS — Z7982 Long term (current) use of aspirin: Secondary | ICD-10-CM | POA: Insufficient documentation

## 2023-01-19 DIAGNOSIS — R509 Fever, unspecified: Secondary | ICD-10-CM | POA: Diagnosis not present

## 2023-01-19 DIAGNOSIS — R059 Cough, unspecified: Secondary | ICD-10-CM | POA: Diagnosis not present

## 2023-01-19 DIAGNOSIS — J069 Acute upper respiratory infection, unspecified: Secondary | ICD-10-CM | POA: Insufficient documentation

## 2023-01-19 DIAGNOSIS — G4489 Other headache syndrome: Secondary | ICD-10-CM | POA: Diagnosis not present

## 2023-01-19 DIAGNOSIS — Z20822 Contact with and (suspected) exposure to covid-19: Secondary | ICD-10-CM | POA: Insufficient documentation

## 2023-01-19 DIAGNOSIS — R5383 Other fatigue: Secondary | ICD-10-CM | POA: Diagnosis not present

## 2023-01-19 LAB — RESP PANEL BY RT-PCR (RSV, FLU A&B, COVID)  RVPGX2
Influenza A by PCR: NEGATIVE
Influenza B by PCR: NEGATIVE
Resp Syncytial Virus by PCR: NEGATIVE
SARS Coronavirus 2 by RT PCR: NEGATIVE

## 2023-01-19 MED ORDER — IBUPROFEN 200 MG PO TABS
600.0000 mg | ORAL_TABLET | Freq: Once | ORAL | Status: AC
  Administered 2023-01-19: 600 mg via ORAL
  Filled 2023-01-19: qty 3

## 2023-01-19 NOTE — ED Provider Notes (Signed)
[place] EMERGENCY DEPARTMENT AT [facility] Provider Note   CSN: [id number] Arrival date & time: 01/19/23  2219     History {Add pertinent medical, surgical, social history, OB history to HPI:1} Chief Complaint  Patient presents with   Headache    Meagan Flores is a 57 y.o. female.  57 yo female with feeling unwell, headache onset yesterday today with fatigue, cough, fever (101 with EMS, given 3 APAP), chills.  BP 165/108 at home, states taking "1/2 a tablet daily."       Home Medications Prior to Admission medications   Medication Sig Start Date End Date Taking? Authorizing Provider  metoprolol succinate (TOPROL-XL) 50 MG 24 hr tablet Take 50 mg by mouth daily. Take with or immediately following a meal.   Yes [provider]  acetaminophen-codeine (TYLENOL #3) 300-30 MG tablet Take 1 tablet by mouth every 4 (four) hours as needed for moderate pain. 11/04/22   Elsie Stain, MD  aspirin EC 81 MG tablet Take 1 tablet (81 mg total) by mouth daily. 01/27/22   Elsie Stain, MD  atorvastatin (LIPITOR) 40 MG tablet Take 1 tablet (40 mg total) by mouth daily at 6 PM. 12/14/22   Park Liter, MD  cyclobenzaprine (FLEXERIL) 5 MG tablet Take 1 tablet (5 mg total) by mouth at bedtime. 11/04/22   Elsie Stain, MD  levothyroxine (EUTHYROX) 100 MCG tablet Take 1 tablet (100 mcg total) by mouth daily before breakfast. 11/04/22   Elsie Stain, MD  valsartan (DIOVAN) 160 MG tablet Take 1 tablet (160 mg total) by mouth daily. 12/16/22   Elsie Stain, MD      Allergies    Sulfa antibiotics    Review of Systems   Review of Systems Negative except as per HPI Physical Exam Updated Vital Signs BP (!) 160/95   Pulse 82   Temp 99.7 F (37.6 C) (Oral)   Resp 16   Ht 5' 8"$  (1.727 m)   Wt 96.2 kg   SpO2 99%   BMI 32.23 kg/m  Physical Exam Vitals and nursing note reviewed.  Constitutional:      General: She is not in acute distress.     Appearance: She is well-developed. She is not diaphoretic.  HENT:     Head: Normocephalic and atraumatic.     Mouth/Throat:     Mouth: Mucous membranes are moist.  Eyes:     Extraocular Movements: Extraocular movements intact.     Pupils: Pupils are equal, round, and reactive to light.  Cardiovascular:     Rate and Rhythm: Normal rate and regular rhythm.     Heart sounds: Normal heart sounds.  Pulmonary:     Effort: Pulmonary effort is normal.     Breath sounds: Normal breath sounds.  Musculoskeletal:        General: Normal range of motion.  Skin:    General: Skin is warm and dry.  Neurological:     Mental Status: She is alert and oriented to person, place, and time.     GCS: GCS eye subscore is 4. GCS verbal subscore is 5. GCS motor subscore is 6.     Cranial Nerves: No cranial nerve deficit or facial asymmetry.  Psychiatric:        Behavior: Behavior normal.     ED Results / Procedures / Treatments   Labs (all labs ordered are listed, but only abnormal results are displayed) Labs Reviewed  RESP PANEL BY RT-PCR (  RSV, FLU A&B, COVID)  RVPGX2    EKG None  Radiology No results found.  Procedures Procedures  {Document cardiac monitor, telemetry assessment procedure when appropriate:1}  Medications Ordered in ED Medications  ibuprofen (ADVIL) tablet 600 mg (600 mg Oral Given 01/19/23 2239)    ED Course/ Medical Decision Making/ A&P   {   Click here for ABCD2, HEART and other calculatorsREFRESH Note before signing :1}                          Medical Decision Making  ***  {Document critical care time when appropriate:1} {Document review of labs and clinical decision tools ie heart score, Chads2Vasc2 etc:1}  {Document your independent review of radiology images, and any outside records:1} {Document your discussion with family members, caretakers, and with consultants:1} {Document social determinants of health affecting pt's care:1} {Document your decision  making why or why not admission, treatments were needed:1} Final Clinical Impression(s) / ED Diagnoses Final diagnoses:  None    Rx / DC Orders ED Discharge Orders     None

## 2023-01-19 NOTE — ED Triage Notes (Signed)
Pt brought by EMS after pt called complaining of fatigue, nausea, headache, generalized feeling of unwellness x 1 day

## 2023-01-20 ENCOUNTER — Encounter: Payer: Commercial Managed Care - HMO | Admitting: Physical Medicine & Rehabilitation

## 2023-01-20 LAB — CBC WITH DIFFERENTIAL/PLATELET
Abs Immature Granulocytes: 0.02 10*3/uL (ref 0.00–0.07)
Basophils Absolute: 0 10*3/uL (ref 0.0–0.1)
Basophils Relative: 0 %
Eosinophils Absolute: 0.1 10*3/uL (ref 0.0–0.5)
Eosinophils Relative: 1 %
HCT: 37.6 % (ref 36.0–46.0)
Hemoglobin: 11.9 g/dL — ABNORMAL LOW (ref 12.0–15.0)
Immature Granulocytes: 0 %
Lymphocytes Relative: 14 %
Lymphs Abs: 1 10*3/uL (ref 0.7–4.0)
MCH: 23.7 pg — ABNORMAL LOW (ref 26.0–34.0)
MCHC: 31.6 g/dL (ref 30.0–36.0)
MCV: 74.8 fL — ABNORMAL LOW (ref 80.0–100.0)
Monocytes Absolute: 0.6 10*3/uL (ref 0.1–1.0)
Monocytes Relative: 8 %
Neutro Abs: 5.5 10*3/uL (ref 1.7–7.7)
Neutrophils Relative %: 77 %
Platelets: 243 10*3/uL (ref 150–400)
RBC: 5.03 MIL/uL (ref 3.87–5.11)
RDW: 15.8 % — ABNORMAL HIGH (ref 11.5–15.5)
WBC: 7.1 10*3/uL (ref 4.0–10.5)
nRBC: 0 % (ref 0.0–0.2)

## 2023-01-20 LAB — BASIC METABOLIC PANEL
Anion gap: 9 (ref 5–15)
BUN: 12 mg/dL (ref 6–20)
CO2: 25 mmol/L (ref 22–32)
Calcium: 8.5 mg/dL — ABNORMAL LOW (ref 8.9–10.3)
Chloride: 103 mmol/L (ref 98–111)
Creatinine, Ser: 0.72 mg/dL (ref 0.44–1.00)
GFR, Estimated: >60 mL/min (ref >60)
Glucose, Bld: 118 mg/dL — ABNORMAL HIGH (ref 70–99)
Potassium: 3.3 mmol/L — ABNORMAL LOW (ref 3.5–5.1)
Sodium: 137 mmol/L (ref 135–145)

## 2023-01-20 LAB — URINALYSIS, ROUTINE W REFLEX MICROSCOPIC
Bacteria, UA: NONE SEEN
Bilirubin Urine: NEGATIVE
Glucose, UA: NEGATIVE mg/dL
Ketones, ur: NEGATIVE mg/dL
Leukocytes,Ua: NEGATIVE
Nitrite: NEGATIVE
Protein, ur: NEGATIVE mg/dL
Specific Gravity, Urine: 1.011 (ref 1.005–1.030)
pH: 7 (ref 5.0–8.0)

## 2023-01-20 MED ORDER — POTASSIUM CHLORIDE CRYS ER 20 MEQ PO TBCR
40.0000 meq | EXTENDED_RELEASE_TABLET | Freq: Once | ORAL | Status: AC
  Administered 2023-01-20: 40 meq via ORAL
  Filled 2023-01-20: qty 2

## 2023-01-20 NOTE — Discharge Instructions (Signed)
Monitor your blood pressure.  Recheck with your primary care provider.  Return to ER for worsening or concerning symptoms.

## 2023-01-27 ENCOUNTER — Encounter: Payer: Self-pay | Admitting: Physical Medicine & Rehabilitation

## 2023-01-27 ENCOUNTER — Encounter
Payer: Commercial Managed Care - HMO | Attending: Physical Medicine & Rehabilitation | Admitting: Physical Medicine & Rehabilitation

## 2023-01-27 VITALS — BP 138/88 | HR 65 | Ht 68.0 in | Wt 215.0 lb

## 2023-01-27 DIAGNOSIS — G8929 Other chronic pain: Secondary | ICD-10-CM | POA: Insufficient documentation

## 2023-01-27 DIAGNOSIS — M25511 Pain in right shoulder: Secondary | ICD-10-CM | POA: Insufficient documentation

## 2023-01-27 NOTE — Progress Notes (Signed)
Subjective:    Patient ID: Meagan Flores, female    DOB: July 21, 1966, 57 y.o.   MRN: [id number]  HPI  57 year old female referred to physical medicine rehab to evaluate chronic right shoulder pain.  She has been seen by her primary care physician Dr. Joya Gaskins who is prescribing Tylenol 3 with codeine twice a day.  In the past she has tried gabapentin but she had side effects that limited usage. She has seen orthopedic surgeon on multiple occasions Frustrated about shoulder pain, 3 surgeries including right distal clavicular resection 2018, right biceps tenotomy and labral repair in 2022 and right biceps tenodesis 2023.  The patient states she had physical therapy after each surgery.  She continues to have pain mainly with lifting the arm as well as external rotation.  No neck pain or numbness or tingling in the hand. The patient had about a 1 day relief with right supra scapular nerve block performed under ultrasound guidance in January 2024. She is looking for other pain management options and does not want to be dependent on medications. Pain Inventory Average Pain 7 Pain Right Now 7 My pain is constant sharp, aching  In the last 24 hours, has pain interfered with the following? General activity 7 Relation with others 5 Enjoyment of life 9 What TIME of day is your pain at its worst? morning , daytime, evening, and night Sleep (in general) Poor  Pain is worse with: some activites Pain improves with: heat/ice and medication Relief from Meds: 3  Family History  Problem Relation Age of Onset   Stomach cancer Sister    Colon polyps Sister    Esophageal cancer Neg Hx    Rectal cancer Neg Hx    Colon cancer Neg Hx    Breast cancer Neg Hx    Social History   Socioeconomic History   Marital status: Married    Spouse name: Not on file   Number of children: Not on file   Years of education: Not on file   Highest education level: Not on file  Occupational History   Not on file   Tobacco Use   Smoking status: Every Day    Packs/day: 0.50    Years: 34.00    Total pack years: 17.00    Types: Cigarettes   Smokeless tobacco: Never  Vaping Use   Vaping Use: Never used  Substance and Sexual Activity   Alcohol use: Yes    Comment: occassional   Drug use: No   Sexual activity: Not on file  Other Topics Concern   Not on file  Social History Narrative   Not on file   Social Determinants of Health   Financial Resource Strain: Not on file  Food Insecurity: Not on file  Transportation Needs: Not on file  Physical Activity: Not on file  Stress: Not on file  Social Connections: Not on file   Past Surgical History:  Procedure Laterality Date   BICEPT TENODESIS Right 12/16/2021   Procedure: RIGHT BICEPS TENODESIS;  Surgeon: Leandrew Koyanagi, MD;  Location: [place];  Service: Orthopedics;  Laterality: Right;   PARTIAL HYSTERECTOMY     right arm sx     SHOULDER ARTHROSCOPY WITH BICEPSTENOTOMY Right 01/21/2021   Procedure: SHOULDER ARTHROSCOPY WITH BICEPSTENOTOMY;  Surgeon: Leandrew Koyanagi, MD;  Location: [place];  Service: Orthopedics;  Laterality: Right;   SHOULDER ARTHROSCOPY WITH DISTAL CLAVICLE RESECTION Right 01/12/2017   Procedure: RIGHT SHOULDER ARTHROSCOPY WITH  SUBACROMIAL  DECOMPRESSION, DISTAL CLAVICLE EXCISION, extensive debridement;  Surgeon: Leandrew Koyanagi, MD;  Location: [place];  Service: Orthopedics;  Laterality: Right;   THYMECTOMY  2004   THYROIDECTOMY     Past Surgical History:  Procedure Laterality Date   BICEPT TENODESIS Right 12/16/2021   Procedure: RIGHT BICEPS TENODESIS;  Surgeon: Leandrew Koyanagi, MD;  Location: [place];  Service: Orthopedics;  Laterality: Right;   PARTIAL HYSTERECTOMY     right arm sx     SHOULDER ARTHROSCOPY WITH BICEPSTENOTOMY Right 01/21/2021   Procedure: SHOULDER ARTHROSCOPY WITH BICEPSTENOTOMY;  Surgeon: Leandrew Koyanagi, MD;  Location: [place];  Service: Orthopedics;  Laterality: Right;   SHOULDER ARTHROSCOPY WITH DISTAL CLAVICLE RESECTION Right 01/12/2017   Procedure: RIGHT SHOULDER ARTHROSCOPY WITH  SUBACROMIAL DECOMPRESSION, DISTAL CLAVICLE EXCISION, extensive debridement;  Surgeon: Leandrew Koyanagi, MD;  Location: [place];  Service: Orthopedics;  Laterality: Right;   THYMECTOMY  2004   THYROIDECTOMY     Past Medical History:  Diagnosis Date   Acute pain of left shoulder 09/08/2017   Aneurysm ([place]) 03/18/2020   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   COVID-19 virus infection 02/14/2020   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) tear of right shoulder 09/30/2020   Essential hypertension    Glenohumeral arthritis 12/27/2014   History of stroke    History of TIA (transient ischemic attack) 02/14/2020   Hyperlipidemia    Hypothyroidism    Impingement syndrome of right shoulder 12/24/2016   Mixed hyperlipidemia    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   Prediabetes    Preop cardiovascular exam 11/25/2020   Pulmonary hypertension, unspecified ([place]) 11/25/2020   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   Rotator cuff impingement syndrome of right shoulder 09/30/2020   Skin rash 02/17/2021   Stroke [facility])    Tendinopathy of right biceps tendon 09/30/2020   Thyroid disease    TIA (transient ischemic attack) 04/15/2018   Tobacco use 09/28/2016   BP 138/88   Pulse 65   Ht '5\' 8"'$  (1.727 m)   Wt 215 lb (97.5 kg)   SpO2 96%   BMI 32.69 kg/m   Opioid Risk Score:   Fall Risk Score:  `1  Depression screen [facility] 2/9     12/09/2022   11:40 AM 11/03/2022    8:44 AM 10/19/2022   10:24 AM 09/07/2022   10:12 AM 05/31/2022    8:40 AM 09/30/2021    9:18 AM 09/29/2021    8:44 AM  Depression screen PHQ 2/9  Decreased Interest '1 1 1 1 1 '$ 0 0  Down, Depressed, Hopeless 1 0 '1 1 1 1 '$ 0  PHQ - 2 Score '2 1 2 2 2 1 '$ 0  Altered sleeping    1 0 0 0  Tired, decreased energy    0 0 1 0  Change in appetite     0 0 0 0  Feeling bad or failure about yourself     0  0 0  Trouble concentrating    0 0 0 0  Moving slowly or fidgety/restless    0 0 0 0  Suicidal thoughts    0 0 0 0  PHQ-9 Score    '3 2 2 '$ 0     Review of Systems  Musculoskeletal:        Rt arm       Objective:   Physical Exam Right deltoid mild atrophy compared to the left side. Sensation mildly reduced over the  right deltoid area. Motor strength is 5/5 in the finger flexors and extensors as well as wrist flexors and extensors limited by pain and with bicep tricep testing but at least 4/5 deltoid is 3 - related to pain. Positive impingement sign on the right side at 45 degrees pain with external rotation past neutral.  There is milder pain with internal rotation as well. There is no hypersensitivity to touch over the subacromial area or the [facility] joint.  No step-off.       Assessment & Plan:   1.  Chronic right shoulder pain has had orthopedic follow-up.  She exhibits tendinosis of the infra and supraspinatus tendon on the most recent MRI.  MRI of the cervical spine shows no evidence of nerve root impingement that would cause her current symptoms. Given some deltoid atrophy as well as reduced sensation over the axillary area she may have some neuropathic component to her pain therefore I am recommending that she sees Dr. Marciano Sequin for [place] treatment Primary care to continue Tylenol 3 prescription which I think is appropriate I informed patient to try taking 2 Flexeril at night.  This also has a neuropathic pain affect similar to tricyclic antidepressants.  If she does better with this dose she can inform her primary care physician to consider increasing Flexeril from 5 mg to 10 mg nightly.

## 2023-01-27 NOTE — Patient Instructions (Signed)
Try taking 2 flexeril at night   Will try a Qu[place] patch , with either Dr Marciano Sequin or Ranell Patrick

## 2023-02-01 ENCOUNTER — Telehealth: Payer: Self-pay

## 2023-02-01 DIAGNOSIS — G8929 Other chronic pain: Secondary | ICD-10-CM

## 2023-02-01 MED ORDER — QUTENZA (4 PATCH) 8 % EX KIT: 1.0000 | PACK | Freq: Once | CUTANEOUS | 0 refills | Status: AC

## 2023-02-03 ENCOUNTER — Other Ambulatory Visit: Payer: Self-pay

## 2023-02-03 ENCOUNTER — Encounter: Payer: Self-pay | Admitting: Critical Care Medicine

## 2023-02-03 ENCOUNTER — Ambulatory Visit: Payer: Commercial Managed Care - HMO | Attending: Critical Care Medicine | Admitting: Critical Care Medicine

## 2023-02-03 VITALS — BP 125/78 | HR 68 | Ht 68.0 in | Wt 212.0 lb

## 2023-02-03 DIAGNOSIS — F17210 Nicotine dependence, cigarettes, uncomplicated: Secondary | ICD-10-CM | POA: Insufficient documentation

## 2023-02-03 DIAGNOSIS — Z8673 Personal history of transient ischemic attack (TIA), and cerebral infarction without residual deficits: Secondary | ICD-10-CM | POA: Insufficient documentation

## 2023-02-03 DIAGNOSIS — Z7982 Long term (current) use of aspirin: Secondary | ICD-10-CM | POA: Diagnosis not present

## 2023-02-03 DIAGNOSIS — I10 Essential (primary) hypertension: Secondary | ICD-10-CM | POA: Insufficient documentation

## 2023-02-03 DIAGNOSIS — M542 Cervicalgia: Secondary | ICD-10-CM | POA: Insufficient documentation

## 2023-02-03 DIAGNOSIS — E782 Mixed hyperlipidemia: Secondary | ICD-10-CM | POA: Diagnosis not present

## 2023-02-03 DIAGNOSIS — Z6833 Body mass index (BMI) 33.0-33.9, adult: Secondary | ICD-10-CM

## 2023-02-03 DIAGNOSIS — S43431A Superior glenoid labrum lesion of right shoulder, initial encounter: Secondary | ICD-10-CM

## 2023-02-03 DIAGNOSIS — E890 Postprocedural hypothyroidism: Secondary | ICD-10-CM | POA: Insufficient documentation

## 2023-02-03 DIAGNOSIS — Z79899 Other long term (current) drug therapy: Secondary | ICD-10-CM | POA: Diagnosis not present

## 2023-02-03 DIAGNOSIS — Z79890 Hormone replacement therapy (postmenopausal): Secondary | ICD-10-CM | POA: Diagnosis not present

## 2023-02-03 DIAGNOSIS — Z720 Tobacco use: Secondary | ICD-10-CM

## 2023-02-03 DIAGNOSIS — I729 Aneurysm of unspecified site: Secondary | ICD-10-CM

## 2023-02-03 DIAGNOSIS — R7303 Prediabetes: Secondary | ICD-10-CM | POA: Diagnosis not present

## 2023-02-03 DIAGNOSIS — M25511 Pain in right shoulder: Secondary | ICD-10-CM | POA: Insufficient documentation

## 2023-02-03 MED ORDER — ACETAMINOPHEN-CODEINE 300-30 MG PO TABS: 1.0000 | ORAL_TABLET | ORAL | 1 refills | Status: DC | PRN

## 2023-02-03 MED ORDER — LEVOTHYROXINE SODIUM 100 MCG PO TABS: 100.0000 ug | ORAL_TABLET | Freq: Every day | ORAL | 1 refills | Status: DC

## 2023-02-03 NOTE — Progress Notes (Signed)
Established Patient Office Visit  Subjective:  Patient ID: Meagan Flores, female    DOB: [birth year]  Age: 57 y.o. MRN: [id number]  CC:  Chief Complaint  Patient presents with   Hypertension    HPI 01/2022 Meagan Flores presents for primary care follow-up.  On arrival blood pressure is good 133/85.  Patient has undergone surgery of the right biceps tendon of the right shoulder due to tendinosis since I last saw her.  The surgery occurred in January and she is undergoing rehab now.  She still has some pain in the shoulder but is improving.  She has reduced her cigarette content down to 1 pack every week.  She has been visiting with our clinical social worker for counseling on depression and anxiety.  This has been beneficial to her.  She did have a viral type illness in January she did not get COVID tested.  This causes weakness fatigue and blurry vision.  She still has cloudiness to her vision in the left eye.  She does not see an eye doctor.  She is trying to apply for disability but has not yet been improved.  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  Patient has no other real complaints at this time.  She needs refills on all her medications.  She has been compliant with her blood pressure cholesterol and thyroid medicines.  She states the Temovate cream helped her rash on her arms.  7/3 This patient seen in return follow-up and she went to the emergency room on 27 June for abdominal pain and chest pain.  Extensive work-up done in the emergency room was negative.  No evidence of heart attack.  EKG chest x-ray is normal labs are normal.  Patient had mild elevation in blood pressure.  She received a GI cocktail symptoms went away.  She was given prescriptions for Carafate and omeprazole but she did not fill these as these were too expensive for her.  Patient's disability has not gone through  Patient's [employer] discount has not yet been approved  Patient needs  refills on all medications and prefers to use [facility] over our pharmacy.  Patient has upcoming appointments with orthopedics for further injections in the shoulder joint.  On arrival blood pressure is good 133/89.  She needs more cream for her eczema on the forearms.  12/6 Patient seen return follow-up for hypertension hypothyroidism hyperlipidemia prior stroke tobacco use  At the last visit her LDL was 172 and she was not taking atorvastatin she has been taking it since and needs refills.  She states the eczema on her hand is improved and she cannot afford the Temovate she is using skin moisturizer.  On arrival blood pressure elevated 138/93 despite being on valsartan 160 mg daily and metoprolol 50 mg daily.  She is smoking a pack of cigarettes every 3 days.  She also has severe tendinitis of the right shoulder she would benefit from an injection she went to rehab medicine however she cannot afford the $900 and she is uninsured.  She states Tylenol 3 is helped in the past meloxicam caused nausea and vomiting Flexeril helps at bedtime  02/03/23 This patient seen in return follow-up on arrival blood pressure is 125/78.  She needs a follow-up lipid panel.  She is continuing to have right arm pain which is neuropathic in nature.  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  Patient is under a lot of stress with this.  Initially blood pressure was elevated on arrival but it came down with rest.  She has been seen by physical medicine and rehab and they are entertaining a topical patch therapy program for pain. 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   COVID-19  virus infection 02/14/2020   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) tear of right shoulder 09/30/2020   Essential hypertension    Glenohumeral arthritis 12/27/2014   History of stroke    History of TIA (transient ischemic attack) 02/14/2020   Hyperlipidemia    Hypothyroidism    Impingement syndrome of right shoulder 12/24/2016   Mixed hyperlipidemia    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   Prediabetes    Preop cardiovascular exam 11/25/2020   Pulmonary hypertension, unspecified ([place]) 11/25/2020   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   Rotator cuff impingement syndrome of right shoulder 09/30/2020   Skin rash 02/17/2021   Stroke [facility])    Tendinopathy of right biceps tendon 09/30/2020   Thyroid disease    TIA (transient ischemic attack) 04/15/2018   Tobacco use 09/28/2016    Past Surgical History:  Procedure Laterality Date   BICEPT TENODESIS Right 12/16/2021   Procedure: RIGHT BICEPS TENODESIS;  Surgeon: Leandrew Koyanagi, MD;  Location: [place];  Service: Orthopedics;  Laterality: Right;   PARTIAL HYSTERECTOMY     right arm sx     SHOULDER ARTHROSCOPY WITH BICEPSTENOTOMY Right 01/21/2021   Procedure: SHOULDER ARTHROSCOPY WITH BICEPSTENOTOMY;  Surgeon: Leandrew Koyanagi, MD;  Location: [place];  Service: Orthopedics;  Laterality: Right;   SHOULDER ARTHROSCOPY WITH DISTAL CLAVICLE RESECTION Right 01/12/2017   Procedure: RIGHT SHOULDER ARTHROSCOPY WITH  SUBACROMIAL DECOMPRESSION, DISTAL CLAVICLE EXCISION, extensive debridement;  Surgeon: Leandrew Koyanagi, MD;  Location: [place];  Service: Orthopedics;  Laterality: Right;   THYMECTOMY  2004   THYROIDECTOMY      Family History  Problem Relation Age of Onset   Stomach cancer Sister    Colon polyps Sister    Esophageal cancer Neg Hx    Rectal cancer Neg Hx    Colon cancer Neg Hx    Breast cancer Neg Hx     Social History   Socioeconomic History   Marital status: Married     Spouse name: Not on file   Number of children: Not on file   Years of education: Not on file   Highest education level: Not on file  Occupational History   Not on file  Tobacco Use   Smoking status: Every Day    Packs/day: 0.50    Years: 34.00    Total pack years: 17.00    Types: Cigarettes   Smokeless tobacco: Never  Vaping Use   Vaping Use: Never used  Substance and Sexual Activity   Alcohol use: Yes    Comment: occassional   Drug use: No   Sexual activity: Not on file  Other Topics Concern   Not on file  Social History Narrative   Not on file   Social Determinants of Health   Financial Resource Strain: Not on file  Food Insecurity: Not on file  Transportation Needs: Not on file  Physical  Activity: Not on file  Stress: Not on file  Social Connections: Not on file  Intimate Partner Violence: Not on file    Outpatient Medications Prior to Visit  Medication Sig Dispense Refill   aspirin EC 81 MG tablet Take 1 tablet (81 mg total) by mouth daily. 60 tablet 3   atorvastatin (LIPITOR) 40 MG tablet Take 1 tablet (40 mg total) by mouth daily at 6 PM. 90 tablet 3   [START ON 03/03/2023] capsaicin topical system (QUTENZA, 4 PATCH,) 8 % Apply 1 patch topically once for 1 dose. Apply patch to effected area every 3 months as needed 1 patch 0   cyclobenzaprine (FLEXERIL) 5 MG tablet Take 1 tablet (5 mg total) by mouth at bedtime. 30 tablet 1   acetaminophen-codeine (TYLENOL #3) 300-30 MG tablet Take 1 tablet by mouth every 4 (four) hours as needed for moderate pain. 60 tablet 1   levothyroxine (EUTHYROX) 100 MCG tablet Take 1 tablet (100 mcg total) by mouth daily before breakfast. 90 tablet 1   metoprolol succinate (TOPROL-XL) 50 MG 24 hr tablet Take 50 mg by mouth daily. Take with or immediately following a meal. (Patient not taking: Reported on 02/03/2023)     valsartan (DIOVAN) 160 MG tablet Take 1 tablet (160 mg total) by mouth daily. (Patient not taking: Reported on 02/03/2023) 90  tablet 0   No facility-administered medications prior to visit.    Allergies  Allergen Reactions   Sulfa Antibiotics Hives    ROS Review of Systems  Constitutional: Negative.   HENT: Negative.  Negative for ear pain, postnasal drip, rhinorrhea, sinus pressure, sore throat, trouble swallowing and voice change.   Eyes:  Negative for visual disturbance.  Respiratory: Negative.  Negative for apnea, cough, choking, chest tightness, shortness of breath, wheezing and stridor.   Cardiovascular: Negative.  Negative for chest pain, palpitations and leg swelling.  Gastrointestinal: Negative.  Negative for abdominal distention, abdominal pain, nausea and vomiting.  Genitourinary: Negative.   Musculoskeletal:  Positive for joint swelling and neck pain. Negative for arthralgias and myalgias.       Right shoulder pain  Skin:  Positive for rash.  Allergic/Immunologic: Negative.  Negative for environmental allergies and food allergies.  Neurological: Negative.  Negative for dizziness, syncope, weakness and headaches.  Hematological: Negative.  Negative for adenopathy. Does not bruise/bleed easily.  Psychiatric/Behavioral: Negative.  Negative for agitation and sleep disturbance. The patient is not nervous/anxious.       Objective:    Physical Exam Vitals reviewed.  Constitutional:      Appearance: Normal appearance. She is well-developed. She is obese. She is not diaphoretic.  HENT:     Head: Normocephalic and atraumatic.     Nose: Nose normal. No nasal deformity, septal deviation, mucosal edema or rhinorrhea.     Right Sinus: No maxillary sinus tenderness or frontal sinus tenderness.     Left Sinus: No maxillary sinus tenderness or frontal sinus tenderness.     Mouth/Throat:     Mouth: Mucous membranes are moist.     Pharynx: Oropharynx is clear. No oropharyngeal exudate.  Eyes:     General: Lids are normal. Vision grossly intact. Gaze aligned appropriately. No allergic shiner, visual  field deficit or scleral icterus.       Right eye: No foreign body, discharge or hordeolum.        Left eye: No foreign body, discharge or hordeolum.     Extraocular Movements: Extraocular movements intact.     Conjunctiva/sclera: Conjunctivae  normal.     Right eye: Right conjunctiva is not injected. No chemosis, exudate or hemorrhage.    Left eye: Left conjunctiva is not injected. No chemosis, exudate or hemorrhage.    Pupils: Pupils are equal, round, and reactive to light.  Neck:     Thyroid: No thyromegaly.     Vascular: No carotid bruit or JVD.     Trachea: Trachea normal. No tracheal tenderness or tracheal deviation.  Cardiovascular:     Rate and Rhythm: Normal rate and regular rhythm.     Chest Wall: PMI is not displaced.     Pulses: Normal pulses. No decreased pulses.     Heart sounds: Normal heart sounds, S1 normal and S2 normal. Heart sounds not distant. No murmur heard.    No systolic murmur is present.     No diastolic murmur is present.     No friction rub. No gallop. No S3 or S4 sounds.  Pulmonary:     Effort: No tachypnea, accessory muscle usage or respiratory distress.     Breath sounds: No stridor. No decreased breath sounds, wheezing, rhonchi or rales.  Chest:     Chest wall: No tenderness.  Abdominal:     General: Bowel sounds are normal. There is no distension.     Palpations: Abdomen is soft. Abdomen is not rigid.     Tenderness: There is no abdominal tenderness. There is no guarding or rebound.  Musculoskeletal:        General: Tenderness present.     Cervical back: Normal range of motion and neck supple. No edema, erythema or rigidity. No muscular tenderness. Normal range of motion.     Comments: Decreased range of motion right shoulder  Lymphadenopathy:     Head:     Right side of head: No submental or submandibular adenopathy.     Left side of head: No submental or submandibular adenopathy.     Cervical: No cervical adenopathy.  Skin:    General: Skin is  warm and dry.     Coloration: Skin is not pale.     Findings: No rash.     Nails: There is no clubbing.     Comments: Mild rash left forearm plantar surface consistent with eczema but is improved  Neurological:     Mental Status: She is alert and oriented to person, place, and time.     Sensory: No sensory deficit.  Psychiatric:        Speech: Speech normal.        Behavior: Behavior normal.     BP 125/78   Pulse 68   Ht '5\' 8"'$  (1.727 m)   Wt 212 lb (96.2 kg)   SpO2 98%   BMI 32.23 kg/m  Wt Readings from Last 3 Encounters:  02/03/23 212 lb (96.2 kg)  01/27/23 215 lb (97.5 kg)  01/19/23 212 lb (96.2 kg)     Health Maintenance Due  Topic Date Due   MAMMOGRAM  12/22/2022    There are no preventive care reminders to display for this patient.  Lab Results  Component Value Date   TSH 6.990 (H) 06/29/2021   Lab Results  Component Value Date   WBC 7.1 01/20/2023   HGB 11.9 (L) 01/20/2023   HCT 37.6 01/20/2023   MCV 74.8 (L) 01/20/2023   PLT 243 01/20/2023   Lab Results  Component Value Date   NA 137 01/20/2023   K 3.3 (L) 01/20/2023   CO2 25 01/20/2023   GLUCOSE  118 (H) 01/20/2023   BUN 12 01/20/2023   CREATININE 0.72 01/20/2023   BILITOT 0.6 05/25/2022   ALKPHOS 90 05/25/2022   AST 24 05/25/2022   ALT 17 05/25/2022   PROT 6.6 05/25/2022   ALBUMIN 3.7 05/25/2022   CALCIUM 8.5 (L) 01/20/2023   ANIONGAP 9 01/20/2023   EGFR 86 01/27/2022   Lab Results  Component Value Date   CHOL 239 (H) 11/03/2022   Lab Results  Component Value Date   HDL 53 11/03/2022   Lab Results  Component Value Date   LDLCALC 168 (H) 11/03/2022   Lab Results  Component Value Date   TRIG 104 11/03/2022   Lab Results  Component Value Date   CHOLHDL 4.5 (H) 11/03/2022   Lab Results  Component Value Date   HGBA1C 5.8 (A) 10/21/2020      Assessment & Plan:   Problem List Items Addressed This Visit       Cardiovascular and Mediastinum   Essential hypertension     Hypertension improved off medication.  Blood pressure only goes up when she is in pain.  Plan is to discontinue valsartan and metoprolol and observe       Aneurysm (HCC)     Endocrine   Postoperative hypothyroidism - Primary    Continue with thyroid supplement      Relevant Medications   levothyroxine (EUTHYROX) 100 MCG tablet   Other Relevant Orders   Thyroid Panel With TSH     Musculoskeletal and Integument   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) tear of right shoulder    Follow-up with physical medicine and rehab treatment plan        Other   History of stroke    Continue aspirin      Mixed hyperlipidemia    Continue atorvastatin      Relevant Orders   Lipid panel   Tobacco use    Patient has resumed cigarette use and one pack will last her 3 days. Attributes to increased stress and pain with right shoulder tendenopathy. Recommended nicotine replacement therapy     Current smoking consumption amount: 1 pack every few days  Dicsussion on advise to quit smoking and smoking impacts: cv impacts  Patient's willingness to quit:  Wants to quit  Methods to quit smoking discussed:  Nicotine replacment  Medication management of smoking session drugs discussed: nicotine patch    Setting quit date  Not established  Follow-up arranged  Dr Joya Gaskins 3 months,   Time spent counseling the patient:  5 min        Prediabetes    Follow healthy diet no medications      BMI 33.0-33.9,adult    Educated on diet      Meds ordered this encounter  Medications   levothyroxine (EUTHYROX) 100 MCG tablet    Sig: Take 1 tablet (100 mcg total) by mouth daily before breakfast.    Dispense:  90 tablet    Refill:  1   acetaminophen-codeine (TYLENOL #3) 300-30 MG tablet    Sig: Take 1 tablet by mouth every 4 (four) hours as needed for moderate pain.    Dispense:  60 tablet    Refill:  1    Follow-up: Return in about 2 months (around 04/05/2023) for htn.     Asencion Noble, MD

## 2023-02-03 NOTE — Assessment & Plan Note (Signed)
Continue aspirin 

## 2023-02-03 NOTE — Assessment & Plan Note (Signed)
Continue atorvastatin

## 2023-02-03 NOTE — Assessment & Plan Note (Signed)
Educated on diet

## 2023-02-03 NOTE — Assessment & Plan Note (Signed)
Follow-up with physical medicine and rehab treatment plan

## 2023-02-03 NOTE — Assessment & Plan Note (Signed)
Hypertension improved off medication.  Blood pressure only goes up when she is in pain.  Plan is to discontinue valsartan and metoprolol and observe

## 2023-02-03 NOTE — Assessment & Plan Note (Signed)
Continue with thyroid supplement

## 2023-02-03 NOTE — Patient Instructions (Signed)
Hold valsartan and metoprolol for now Stay on thyroid pill and cholesterol pill Labs today will be for thyroid function and cholesterol level  Please have your daughter or you call us in with the last 10 blood pressure readings at home and continue to take your blood pressure  Return for Dr. Joya Gaskins for blood pressure follow-up 2 months

## 2023-02-03 NOTE — Assessment & Plan Note (Signed)
Patient has resumed cigarette use and one pack will last her 3 days. Attributes to increased stress and pain with right shoulder tendenopathy. Recommended nicotine replacement therapy     Current smoking consumption amount: 1 pack every few days  Dicsussion on advise to quit smoking and smoking impacts: cv impacts  Patient's willingness to quit:  Wants to quit  Methods to quit smoking discussed:  Nicotine replacment  Medication management of smoking session drugs discussed: nicotine patch    Setting quit date  Not established  Follow-up arranged  Dr Joya Gaskins 3 months,   Time spent counseling the patient:  5 min

## 2023-02-03 NOTE — Assessment & Plan Note (Signed)
Follow healthy diet no medications

## 2023-02-04 LAB — LIPID PANEL
Chol/HDL Ratio: 4.5 ratio — ABNORMAL HIGH (ref 0.0–4.4)
Cholesterol, Total: 237 mg/dL — ABNORMAL HIGH (ref 100–199)
HDL: 53 mg/dL (ref >39)
LDL Chol Calc (NIH): 163 mg/dL — ABNORMAL HIGH (ref 0–99)
Triglycerides: 116 mg/dL (ref 0–149)
VLDL Cholesterol Cal: 21 mg/dL (ref 5–40)

## 2023-02-04 LAB — THYROID PANEL WITH TSH
Free Thyroxine Index: 2.5 (ref 1.2–4.9)
T3 Uptake Ratio: 28 % (ref 24–39)
T4, Total: 9 ug/dL (ref 4.5–12.0)
TSH: 12.9 u[IU]/mL — ABNORMAL HIGH (ref 0.450–4.500)

## 2023-02-07 ENCOUNTER — Other Ambulatory Visit: Payer: Self-pay | Admitting: Critical Care Medicine

## 2023-02-07 ENCOUNTER — Ambulatory Visit
Admission: RE | Admit: 2023-02-07 | Discharge: 2023-02-07 | Disposition: A | Discharge status: Home / Self Care | Payer: Commercial Managed Care - HMO | Source: Ambulatory Visit | Attending: Critical Care Medicine | Admitting: Critical Care Medicine

## 2023-02-07 DIAGNOSIS — Z1231 Encounter for screening mammogram for malignant neoplasm of breast: Secondary | ICD-10-CM

## 2023-02-07 MED ORDER — EZETIMIBE 10 MG PO TABS: 10.0000 mg | ORAL_TABLET | Freq: Every day | ORAL | 3 refills | Status: AC

## 2023-02-07 NOTE — Progress Notes (Signed)
Let pt know cholesterol very high I sent zetia cholesterol pill to take in addition to atorvastatin to [facility], thyroid ok

## 2023-02-07 NOTE — Progress Notes (Signed)
Patient informed per PCP Let pt know cholesterol very high I sent zetia cholesterol pill to take in addition to atorvastatin to [facility], thyroid ok . Patient voiced understanding of all discussed .

## 2023-02-09 NOTE — Progress Notes (Signed)
Let pt know mammogram normal recheck one yeaer

## 2023-02-10 ENCOUNTER — Telehealth: Payer: Self-pay

## 2023-02-10 NOTE — Telephone Encounter (Signed)
Pt was called and is aware of results, DOB was confirmed.  ?

## 2023-02-10 NOTE — Telephone Encounter (Signed)
-----   Message from Elsie Stain, MD sent at 02/09/2023  3:23 PM EDT ----- Let pt know mammogram normal recheck one yeaer

## 2023-03-03 ENCOUNTER — Encounter: Payer: Commercial Managed Care - HMO | Admitting: Physical Medicine & Rehabilitation

## 2023-03-08 ENCOUNTER — Ambulatory Visit: Payer: Self-pay | Admitting: *Deleted

## 2023-03-08 NOTE — Telephone Encounter (Signed)
  Chief Complaint: depression, son killed 2 weeks ago requesting medication for nerves and sleep Symptoms: not able to sleep, tired, nervous , crying , no appetite Frequency: 2 weeks ago  Pertinent Negatives: Patient denies harming self or others, no chest pain no difficulty breathing  Disposition: [] ED /[] Urgent Care (no appt availability in office) / [x] Appointment(In office/virtual)/ []  [facility] [facility]/ [] Home Care/ [] Refused Recommended Disposition /[] [place] [facility]/ []  Follow-up with PCP Additional Notes:   No available appt until 03/23/23 with a provider. Scheduled 03/23/23 , please advise if earlier appt available. On waitlist for sooner appt . Please advise . Gave patient # for Urgent crisis center      Reason for Disposition  [1] Depression AND [2] worsening (e.g., sleeping poorly, less able to do activities of daily living)  Answer Assessment - Initial Assessment Questions 1. CONCERN: "What happened that made you call today?"     Depression, son killed 2 weeks ago  2. DEPRESSION SYMPTOM SCREENING: "How are you feeling overall?" (e.g., decreased energy, increased sleeping or difficulty sleeping, difficulty concentrating, feelings of sadness, guilt, hopelessness, or worthlessness)     Not sleeping no appetite, nervous, sadness 3. RISK OF HARM - SUICIDAL IDEATION:  "Do you ever have thoughts of hurting or killing yourself?"  (e.g., yes, no, no but preoccupation with thoughts about death)   - INTENT:  "Do you have thoughts of hurting or killing yourself right NOW?" (e.g., yes, no, N/A)   - PLAN: "Do you have a specific plan for how you would do this?" (e.g., gun, knife, overdose, no plan, N/A)     No  4. RISK OF HARM - HOMICIDAL IDEATION:  "Do you ever have thoughts of hurting or killing someone else?"  (e.g., yes, no, no but preoccupation with thoughts about death)   - INTENT:  "Do you have thoughts of hurting or killing someone right NOW?" (e.g., yes, no, N/A)   -  PLAN: "Do you have a specific plan for how you would do this?" (e.g., gun, knife, no plan, N/A)      na 5. FUNCTIONAL IMPAIRMENT: "How have things been going for you overall? Have you had more difficulty than usual doing your normal daily activities?"  (e.g., better, same, worse; self-care, school, work, interactions)     Can not sleep, tired, nervous  6. SUPPORT: "Who is with you now?" "Who do you live with?" "Do you have family or friends who you can talk to?"      Family  7. THERAPIST: "Do you have a counselor or therapist? Name?"     Has talked to a therapist in 2020 8. STRESSORS: "Has there been any new stress or recent changes in your life?"     Son killed 2 weeks ago  9. ALCOHOL USE OR SUBSTANCE USE (DRUG USE): "Do you drink alcohol or use any illegal drugs?"     na 10. OTHER: "Do you have any other physical symptoms right now?" (e.g., fever)       Overwhelmed , tired not able to sleep  11. PREGNANCY: "Is there any chance you are pregnant?" "When was your last menstrual period?"       na  Protocols used: Depression-A-AH

## 2023-03-09 NOTE — Telephone Encounter (Signed)
Ok to add video visit tomorrow

## 2023-03-09 NOTE — Telephone Encounter (Signed)
Would you like to see her in person or my chart visit for tomorrow . You have 11 pts on the schedule

## 2023-03-10 NOTE — Telephone Encounter (Signed)
Routing to SW

## 2023-03-10 NOTE — Telephone Encounter (Signed)
Noted  

## 2023-03-11 NOTE — Telephone Encounter (Signed)
Toni Amend and Cassie   Thank you very much I will call her next week when I am back in the office as well on tuesday

## 2023-03-14 NOTE — Telephone Encounter (Signed)
She is meeting with me 03/28/23 virtually (her request for it to be virtually and later in the month)

## 2023-03-14 NOTE — Telephone Encounter (Signed)
Noted  

## 2023-03-14 NOTE — Progress Notes (Signed)
Established Patient Office Visit  Subjective:  Patient ID: Meagan Flores, female    DOB: 02-13-1966  Age: 57 y.o. MRN: [id number] Virtual Visit via Telephone Note  I connected with Meagan Flores on 03/15/23 at  1:30 PM EDT by telephone and verified that I am speaking with the correct person using two identifiers.   Consent:  I discussed the limitations, risks, security and privacy concerns of performing an evaluation and management service by telephone and the availability of in person appointments. I also discussed with the patient that there may be a patient responsible charge related to this service. The patient expressed understanding and agreed to proceed.  Location of patient: Patient's at home  Location of provider: I am in my office  Persons participating in the televisit with the patient.       CC:  Anxiety depression over son's murder  HPI 01/2022 Meagan Flores presents for primary care follow-up.  On arrival blood pressure is good 133/85.  Patient has undergone surgery of the right biceps tendon of the right shoulder due to tendinosis since I last saw her.  The surgery occurred in January and she is undergoing rehab now.  She still has some pain in the shoulder but is improving.  She has reduced her cigarette content down to 1 pack every week.  She has been visiting with our clinical social worker for counseling on depression and anxiety.  This has been beneficial to her.  She did have a viral type illness in January she did not get COVID tested.  This causes weakness fatigue and blurry vision.  She still has cloudiness to her vision in the left eye.  She does not see an eye doctor.  She is trying to apply for disability but has not yet been improved.  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  Patient has no other real complaints at this time.  She needs refills on all her medications.  She has been compliant with her blood pressure cholesterol and  thyroid medicines.  She states the Temovate cream helped her rash on her arms.  7/3 This patient seen in return follow-up and she went to the emergency room on 27 June for abdominal pain and chest pain.  Extensive work-up done in the emergency room was negative.  No evidence of heart attack.  EKG chest x-ray is normal labs are normal.  Patient had mild elevation in blood pressure.  She received a GI cocktail symptoms went away.  She was given prescriptions for Carafate and omeprazole but she did not fill these as these were too expensive for her.  Patient's disability has not gone through  Patient's [place] discount has not yet been approved  Patient needs refills on all medications and prefers to use [facility] over our pharmacy.  Patient has upcoming appointments with orthopedics for further injections in the shoulder joint.  On arrival blood pressure is good 133/89.  She needs more cream for her eczema on the forearms.  12/6 Patient seen return follow-up for hypertension hypothyroidism hyperlipidemia prior stroke tobacco use  At the last visit her LDL was 172 and she was not taking atorvastatin she has been taking it since and needs refills.  She states the eczema on her hand is improved and she cannot afford the Temovate she is using skin moisturizer.  On arrival blood pressure elevated 138/93 despite being on valsartan 160 mg daily and metoprolol 50 mg  daily.  She is smoking a pack of cigarettes every 3 days.  She also has severe tendinitis of the right shoulder she would benefit from an injection she went to rehab medicine however she cannot afford the $900 and she is uninsured.  She states Tylenol 3 is helped in the past meloxicam caused nausea and vomiting Flexeril helps at bedtime  02/03/23 This patient seen in return follow-up on arrival blood pressure is 125/78.  She needs a follow-up lipid panel.  She is continuing to have right arm pain which is neuropathic in nature.  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  Patient is under a lot of stress with this.  Initially blood pressure was elevated on arrival but it came down with rest.  She has been seen by physical medicine and rehab and they are entertaining a topical patch therapy program for pain. Patient is still trying to get disability  03/15/23 Patient seen today by way of a telephone visit.  Tragically she lost her son who was murdered by a gang on March 23.  This was in [place].  The patient has continued to have severe anxiety not eating not able to sleep.  This is superimposed upon chronic left shoulder pain.  She was seeing physical therapy and physical medicine and rehab.  They recommended topical therapy.  She cannot afford this.  She will need patient assistance.  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   Prediabetes    Preop cardiovascular exam 11/25/2020   Pulmonary hypertension, unspecified (HCC) 11/25/2020   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   Rotator cuff impingement syndrome of right shoulder 09/30/2020   Skin rash 02/17/2021   Stroke [facility])    Tendinopathy of right biceps tendon 09/30/2020   Thyroid disease    TIA (transient ischemic attack) 04/15/2018   Tobacco use 09/28/2016     Past Surgical History:  Procedure Laterality Date   BICEPT TENODESIS Right 12/16/2021   Procedure: RIGHT BICEPS TENODESIS;  Surgeon: Tarry Kos, MD;  Location: [facility];  Service: Orthopedics;  Laterality: Right;   PARTIAL HYSTERECTOMY     right arm sx     SHOULDER ARTHROSCOPY WITH BICEPSTENOTOMY Right 01/21/2021   Procedure: SHOULDER ARTHROSCOPY WITH BICEPSTENOTOMY;  Surgeon: Tarry Kos, MD;  Location: [facility];  Service: Orthopedics;  Laterality: Right;   SHOULDER ARTHROSCOPY WITH DISTAL CLAVICLE RESECTION Right 01/12/2017   Procedure: RIGHT SHOULDER ARTHROSCOPY WITH  SUBACROMIAL DECOMPRESSION, DISTAL CLAVICLE EXCISION, extensive debridement;  Surgeon: Tarry Kos, MD;  Location: [facility];  Service: Orthopedics;  Laterality: Right;   THYMECTOMY  2004   THYROIDECTOMY      Family History  Problem Relation Age of Onset   Stomach cancer Sister    Colon polyps Sister    Esophageal cancer  Neg Hx    Rectal cancer Neg Hx    Colon cancer Neg Hx    Breast cancer Neg Hx     Social History   Socioeconomic History   Marital status: Married    Spouse name: Not on file   Number of children: Not on file   Years of education: Not on file   Highest education level: Not on file  Occupational History   Not on file  Tobacco Use   Smoking status: Every Day    Packs/day: 0.50    Years: 34.00    Additional pack years: 0.00    Total pack years: 17.00    Types: Cigarettes   Smokeless tobacco: Never  Vaping Use   Vaping Use: Never used  Substance and Sexual Activity   Alcohol use: Yes    Comment: occassional   Drug use: No   Sexual activity: Not on file  Other Topics Concern   Not on file  Social History Narrative   Not on file   Social Determinants of Health   Financial Resource Strain: Not on file  Food Insecurity: Not on file  Transportation Needs: Not on file  Physical Activity: Not on file  Stress: Not on file   Social Connections: Not on file  Intimate Partner Violence: Not on file    Outpatient Medications Prior to Visit  Medication Sig Dispense Refill   acetaminophen-codeine (TYLENOL #3) 300-30 MG tablet Take 1 tablet by mouth every 4 (four) hours as needed for moderate pain. 60 tablet 1   aspirin EC 81 MG tablet Take 1 tablet (81 mg total) by mouth daily. 60 tablet 3   atorvastatin (LIPITOR) 40 MG tablet Take 1 tablet (40 mg total) by mouth daily at 6 PM. 90 tablet 3   cyclobenzaprine (FLEXERIL) 5 MG tablet Take 1 tablet (5 mg total) by mouth at bedtime. 30 tablet 1   ezetimibe (ZETIA) 10 MG tablet Take 1 tablet (10 mg total) by mouth daily. 90 tablet 3   levothyroxine (EUTHYROX) 100 MCG tablet Take 1 tablet (100 mcg total) by mouth daily before breakfast. 90 tablet 1   No facility-administered medications prior to visit.    Allergies  Allergen Reactions   Sulfa Antibiotics Hives    ROS Review of Systems  Constitutional: Negative.   HENT: Negative.  Negative for ear pain, postnasal drip, rhinorrhea, sinus pressure, sore throat, trouble swallowing and voice change.   Eyes:  Negative for visual disturbance.  Respiratory: Negative.  Negative for apnea, cough, choking, chest tightness, shortness of breath, wheezing and stridor.   Cardiovascular: Negative.  Negative for chest pain, palpitations and leg swelling.  Gastrointestinal: Negative.  Negative for abdominal distention, abdominal pain, nausea and vomiting.  Genitourinary: Negative.   Musculoskeletal:  Positive for neck pain. Negative for arthralgias, joint swelling and myalgias.       Right shoulder pain  Skin:  Negative for rash.  Allergic/Immunologic: Negative.  Negative for environmental allergies and food allergies.  Neurological: Negative.  Negative for dizziness, syncope, weakness and headaches.  Hematological: Negative.  Negative for adenopathy. Does not bruise/bleed easily.  Psychiatric/Behavioral:  Positive for decreased  concentration, dysphoric mood and sleep disturbance. Negative for agitation, behavioral problems, confusion, hallucinations, self-injury and suicidal ideas. The patient is nervous/anxious. The patient is not hyperactive.       Objective:   No exam this is a phone visit   There were no vitals taken for this visit. Wt Readings from Last 3 Encounters:  02/03/23  212 lb (96.2 kg)  01/27/23 215 lb (97.5 kg)  01/19/23 212 lb (96.2 kg)     There are no preventive care reminders to display for this patient.   There are no preventive care reminders to display for this patient.  Lab Results  Component Value Date   TSH 12.900 (H) 02/03/2023   Lab Results  Component Value Date   WBC 7.1 01/20/2023   HGB 11.9 (L) 01/20/2023   HCT 37.6 01/20/2023   MCV 74.8 (L) 01/20/2023   PLT 243 01/20/2023   Lab Results  Component Value Date   NA 137 01/20/2023   K 3.3 (L) 01/20/2023   CO2 25 01/20/2023   GLUCOSE 118 (H) 01/20/2023   BUN 12 01/20/2023   CREATININE 0.72 01/20/2023   BILITOT 0.6 05/25/2022   ALKPHOS 90 05/25/2022   AST 24 05/25/2022   ALT 17 05/25/2022   PROT 6.6 05/25/2022   ALBUMIN 3.7 05/25/2022   CALCIUM 8.5 (L) 01/20/2023   ANIONGAP 9 01/20/2023   EGFR 86 01/27/2022   Lab Results  Component Value Date   CHOL 237 (H) 02/03/2023   Lab Results  Component Value Date   HDL 53 02/03/2023   Lab Results  Component Value Date   LDLCALC 163 (H) 02/03/2023   Lab Results  Component Value Date   TRIG 116 02/03/2023   Lab Results  Component Value Date   CHOLHDL 4.5 (H) 02/03/2023   Lab Results  Component Value Date   HGBA1C 5.8 (A) 10/21/2020      Assessment & Plan:   Problem List Items Addressed This Visit       Other   Reactive depression - Primary    Severe anxiety and depression due to loss of son who was murdered on March 23  Plan trial of Lexapro 10 mg daily and trazodone for sleep  No change in other medications  Licensed clinical social work  will follow-up with the patient      Relevant Medications   escitalopram (LEXAPRO) 10 MG tablet   traZODone (DESYREL) 50 MG tablet   Meds ordered this encounter  Medications   escitalopram (LEXAPRO) 10 MG tablet    Sig: Take 1 tablet (10 mg total) by mouth daily.    Dispense:  30 tablet    Refill:  1   traZODone (DESYREL) 50 MG tablet    Sig: Take 0.5-1 tablets (25-50 mg total) by mouth at bedtime as needed for sleep.    Dispense:  30 tablet    Refill:  3      Follow Up Instructions: Patient knows follow-up visit will occur medicines given for anxiety depression and she will have a mental health follow-up as well with licensed clinical social work   I discussed the assessment and treatment plan with the patient. The patient was provided an opportunity to ask questions and all were answered. The patient agreed with the plan and demonstrated an understanding of the instructions.   The patient was advised to call back or seek an in-person evaluation if the symptoms worsen or if the condition fails to improve as anticipated.  I provided 30 minutes of non-face-to-face time during this encounter  including  median intraservice time , review of notes, labs, imaging, medications  and explaining diagnosis and management to the patient .    Shan Levans, MD

## 2023-03-15 ENCOUNTER — Encounter: Payer: Self-pay | Admitting: Critical Care Medicine

## 2023-03-15 ENCOUNTER — Ambulatory Visit: Payer: Commercial Managed Care - HMO | Attending: Critical Care Medicine | Admitting: Critical Care Medicine

## 2023-03-15 DIAGNOSIS — Z634 Disappearance and death of family member: Secondary | ICD-10-CM

## 2023-03-15 DIAGNOSIS — F32A Depression, unspecified: Secondary | ICD-10-CM | POA: Diagnosis not present

## 2023-03-15 DIAGNOSIS — F329 Major depressive disorder, single episode, unspecified: Secondary | ICD-10-CM

## 2023-03-15 MED ORDER — TRAZODONE HCL 50 MG PO TABS: 25.0000 mg | ORAL_TABLET | Freq: Every evening | ORAL | 3 refills | Status: DC | PRN

## 2023-03-15 MED ORDER — ESCITALOPRAM OXALATE 10 MG PO TABS: 10.0000 mg | ORAL_TABLET | Freq: Every day | ORAL | 1 refills | Status: DC

## 2023-03-15 NOTE — Assessment & Plan Note (Signed)
Severe anxiety and depression due to loss of son who was murdered on March 23  Plan trial of Lexapro 10 mg daily and trazodone for sleep  No change in other medications  Licensed clinical social work will follow-up with the patient

## 2023-03-21 ENCOUNTER — Encounter: Payer: Commercial Managed Care - HMO | Admitting: Physical Medicine & Rehabilitation

## 2023-03-23 ENCOUNTER — Ambulatory Visit: Payer: Commercial Managed Care - HMO | Attending: Physician Assistant | Admitting: Licensed Clinical Social Worker

## 2023-03-23 DIAGNOSIS — Z634 Disappearance and death of family member: Secondary | ICD-10-CM

## 2023-03-23 DIAGNOSIS — F330 Major depressive disorder, recurrent, mild: Secondary | ICD-10-CM | POA: Insufficient documentation

## 2023-03-23 DIAGNOSIS — F4321 Adjustment disorder with depressed mood: Secondary | ICD-10-CM

## 2023-03-23 NOTE — Telephone Encounter (Signed)
Sent to specialty pharmacy

## 2023-03-23 NOTE — BH Specialist Note (Unsigned)
Integrated Behavioral Health Initial In-Person Visit  MRN: [id number] Name: Meagan Flores  Number of Integrated Behavioral Health Clinician visits: 1- Initial Visit  Session Start time: [phone]    Session End time: 1000  Total time in minutes: 38   Types of Service: {CHL AMB TYPE OF SERVICE:[phone]}  Interpretor:{yes JX:[id number]} Interpretor Name and Language: ***   Warm Hand Off Completed.        Subjective: Meagan Flores is a 57 y.o. female accompanied by {CHL AMB ACCOMPANIED NF:[id number]} Patient was referred by *** for ***. Patient reports the following symptoms/concerns: *** Duration of problem: ***; Severity of problem: {Mild/Moderate/Severe:20260}  Objective: Mood: {BHH MOOD:22306} and Affect: {BHH AFFECT:22307} Risk of harm to self or others: {CHL AMB BH Suicide Current Mental Status:21022748}  Life Context: Family and Social: *** School/Work: *** Self-Care: *** Life Changes: ***  Patient and/or Family's Strengths/Protective Factors: {CHL AMB BH PROTECTIVE [phone]}  Goals Addressed: Patient will: Reduce symptoms of: {IBH Symptoms:21014056} Increase knowledge and/or ability of: {IBH Patient Tools:21014057}  Demonstrate ability to: {IBH Goals:21014053}  Progress towards Goals: {CHL AMB BH PROGRESS TOWARDS GOALS:5171071248}  Interventions: Interventions utilized: {IBH Interventions:21014054}  Standardized Assessments completed: {IBH Screening Tools:21014051}  Patient and/or Family Response: ***  Patient Centered Plan: Patient is on the following Treatment Plan(s):  ***  Assessment: Patient currently experiencing ***.   Patient may benefit from ***.  Plan: Follow up with behavioral health clinician on : *** Behavioral recommendations: *** Referral(s): {IBH Referrals:21014055} "From scale of 1-10, how likely are you to follow plan?": ***  Vassie Loll, LCSW

## 2023-03-28 ENCOUNTER — Institutional Professional Consult (permissible substitution): Payer: Self-pay | Admitting: Licensed Clinical Social Worker

## 2023-03-28 ENCOUNTER — Telehealth: Payer: Self-pay | Admitting: Licensed Clinical Social Worker

## 2023-03-28 NOTE — Telephone Encounter (Signed)
Copied from CRM [id number]. Topic: Appointment Scheduling - Scheduling Inquiry for Clinic >> Mar 28, 2023  8:44 AM Turkey B wrote: Reason for CRM: Patient wants to cancel appt today, will callback to reschedule. She has perosnal business to take care of

## 2023-04-06 ENCOUNTER — Encounter: Payer: Self-pay | Admitting: Critical Care Medicine

## 2023-04-06 ENCOUNTER — Other Ambulatory Visit: Payer: Self-pay | Admitting: Critical Care Medicine

## 2023-04-06 ENCOUNTER — Ambulatory Visit: Payer: Commercial Managed Care - HMO | Attending: Critical Care Medicine | Admitting: Critical Care Medicine

## 2023-04-06 VITALS — BP 118/78 | HR 75 | Temp 98.3°F | Ht 68.0 in | Wt 204.0 lb

## 2023-04-06 DIAGNOSIS — F17210 Nicotine dependence, cigarettes, uncomplicated: Secondary | ICD-10-CM | POA: Diagnosis not present

## 2023-04-06 DIAGNOSIS — Z720 Tobacco use: Secondary | ICD-10-CM

## 2023-04-06 DIAGNOSIS — M7541 Impingement syndrome of right shoulder: Secondary | ICD-10-CM

## 2023-04-06 DIAGNOSIS — I10 Essential (primary) hypertension: Secondary | ICD-10-CM | POA: Diagnosis not present

## 2023-04-06 DIAGNOSIS — Z634 Disappearance and death of family member: Secondary | ICD-10-CM

## 2023-04-06 DIAGNOSIS — Z760 Encounter for issue of repeat prescription: Secondary | ICD-10-CM | POA: Diagnosis not present

## 2023-04-06 DIAGNOSIS — F329 Major depressive disorder, single episode, unspecified: Secondary | ICD-10-CM

## 2023-04-06 DIAGNOSIS — E890 Postprocedural hypothyroidism: Secondary | ICD-10-CM

## 2023-04-06 DIAGNOSIS — F32A Depression, unspecified: Secondary | ICD-10-CM | POA: Insufficient documentation

## 2023-04-06 DIAGNOSIS — M67813 Other specified disorders of tendon, right shoulder: Secondary | ICD-10-CM | POA: Diagnosis not present

## 2023-04-06 DIAGNOSIS — F4321 Adjustment disorder with depressed mood: Secondary | ICD-10-CM

## 2023-04-06 DIAGNOSIS — F330 Major depressive disorder, recurrent, mild: Secondary | ICD-10-CM

## 2023-04-06 DIAGNOSIS — F419 Anxiety disorder, unspecified: Secondary | ICD-10-CM | POA: Insufficient documentation

## 2023-04-06 MED ORDER — NICOTINE POLACRILEX 4 MG MT LOZG: LOZENGE | OROMUCOSAL | 4 refills | Status: AC

## 2023-04-06 MED ORDER — LIDOCAINE 5 % EX PTCH: 1.0000 | MEDICATED_PATCH | CUTANEOUS | 4 refills | Status: DC

## 2023-04-06 MED ORDER — CYCLOBENZAPRINE HCL 5 MG PO TABS: 5.0000 mg | ORAL_TABLET | Freq: Every day | ORAL | 1 refills | Status: DC

## 2023-04-06 MED ORDER — ACETAMINOPHEN-CODEINE 300-30 MG PO TABS: 1.0000 | ORAL_TABLET | ORAL | 1 refills | Status: DC | PRN

## 2023-04-06 MED ORDER — ACETAMINOPHEN-CODEINE 300-30 MG PO TABS: 1.0000 | ORAL_TABLET | ORAL | 1 refills | Status: AC | PRN

## 2023-04-06 NOTE — Addendum Note (Signed)
Addended by: Storm Frisk on: 04/06/2023 05:58 PM   Modules accepted: Orders

## 2023-04-06 NOTE — Progress Notes (Signed)
Established Patient Office Visit  Subjective:  Patient ID: Meagan Flores, female    DOB: 1965/12/10  Age: 57 y.o. MRN: [id number]     CC:  Anxiety depression over son's murder  HPI 01/2022 Meagan Flores presents for primary care follow-up.  On arrival blood pressure is good 133/85.  Patient has undergone surgery of the right biceps tendon of the right shoulder due to tendinosis since I last saw her.  The surgery occurred in January and she is undergoing rehab now.  She still has some pain in the shoulder but is improving.  She has reduced her cigarette content down to 1 pack every week.  She has been visiting with our clinical social worker for counseling on depression and anxiety.  This has been beneficial to her.  She did have a viral type illness in January she did not get COVID tested.  This causes weakness fatigue and blurry vision.  She still has cloudiness to her vision in the left eye.  She does not see an eye doctor.  She is trying to apply for disability but has not yet been improved.  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  Patient has no other real complaints at this time.  She needs refills on all her medications.  She has been compliant with her blood pressure cholesterol and thyroid medicines.  She states the Temovate cream helped her rash on her arms.  7/3 This patient seen in return follow-up and she went to the emergency room on 27 June for abdominal pain and chest pain.  Extensive work-up done in the emergency room was negative.  No evidence of heart attack.  EKG chest x-ray is normal labs are normal.  Patient had mild elevation in blood pressure.  She received a GI cocktail symptoms went away.  She was given prescriptions for Carafate and omeprazole but she did not fill these as these were too expensive for her.  Patient's disability has not gone through  Patient's [place] discount has not yet been approved  Patient needs refills on all  medications and prefers to use [facility] over our pharmacy.  Patient has upcoming appointments with orthopedics for further injections in the shoulder joint.  On arrival blood pressure is good 133/89.  She needs more cream for her eczema on the forearms.  12/6 Patient seen return follow-up for hypertension hypothyroidism hyperlipidemia prior stroke tobacco use  At the last visit her LDL was 172 and she was not taking atorvastatin she has been taking it since and needs refills.  She states the eczema on her hand is improved and she cannot afford the Temovate she is using skin moisturizer.  On arrival blood pressure elevated 138/93 despite being on valsartan 160 mg daily and metoprolol 50 mg daily.  She is smoking a pack of cigarettes every 3 days.  She also has severe tendinitis of the right shoulder she would benefit from an injection she went to rehab medicine however she cannot afford the $900 and she is uninsured.  She states Tylenol 3 is helped in the past meloxicam caused nausea and vomiting Flexeril helps at bedtime  02/03/23 This patient seen in return follow-up on arrival blood pressure is 125/78.  She needs a follow-up lipid panel.  She is continuing to have right arm pain which is neuropathic in nature.  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  Patient is under a lot of stress with this.  Initially blood pressure was elevated on arrival but it came down with rest.  She has been seen by physical medicine and rehab and they are entertaining a topical patch therapy program for pain. Patient is still trying to get disability  03/15/23 Patient seen today by way of a telephone visit.  Tragically she lost her son who was murdered by a gang on March 23.  This was in [place].  The patient has continued to have  severe anxiety not eating not able to sleep.  This is superimposed upon chronic left shoulder pain.  She was seeing physical therapy and physical medicine and rehab.  They recommended topical therapy.  She cannot afford this.  She will need patient assistance.  5/8 Patient seen in return follow-up face-to-face after a telephone visit in April.  She is still depressed over her son's murder.  She is getting counseling.  She complains of increased pain in the right arm generated by chronic neck and right shoulder rotator cuff conditions.  She still smokes a pack a day of cigarettes and has had elevations in blood pressure.  Today on arrival blood pressure improved at 119/78.  She needs a follow-up metabolic panel.  She is looking for other options.  She was with physical medicine and rehab and they recommended a very expensive medication treatment she cannot go back to them because she owes $900 and she does not have Medicaid.  No other medication changes are needed. 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   Prediabetes    Preop cardiovascular exam 11/25/2020   Pulmonary hypertension, unspecified (HCC) 11/25/2020   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   Rotator cuff impingement syndrome of right shoulder 09/30/2020   Skin rash 02/17/2021   Stroke [facility])    Tendinopathy of right biceps tendon 09/30/2020   Thyroid disease    TIA (transient ischemic attack) 04/15/2018    Tobacco use 09/28/2016    Past Surgical History:  Procedure Laterality Date   BICEPT TENODESIS Right 12/16/2021   Procedure: RIGHT BICEPS TENODESIS;  Surgeon: Tarry Kos, MD;  Location: [facility];  Service: Orthopedics;  Laterality: Right;   PARTIAL HYSTERECTOMY     right arm sx     SHOULDER ARTHROSCOPY WITH BICEPSTENOTOMY Right 01/21/2021   Procedure: SHOULDER ARTHROSCOPY WITH BICEPSTENOTOMY;  Surgeon: Tarry Kos, MD;  Location: [facility];  Service: Orthopedics;  Laterality: Right;   SHOULDER ARTHROSCOPY WITH DISTAL CLAVICLE RESECTION Right 01/12/2017   Procedure: RIGHT SHOULDER ARTHROSCOPY WITH  SUBACROMIAL DECOMPRESSION, DISTAL CLAVICLE EXCISION, extensive debridement;  Surgeon: Tarry Kos, MD;  Location: [facility];  Service: Orthopedics;  Laterality: Right;   THYMECTOMY  2004   THYROIDECTOMY      Family History  Problem Relation Age of Onset   Stomach cancer Sister  Colon polyps Sister    Esophageal cancer Neg Hx    Rectal cancer Neg Hx    Colon cancer Neg Hx    Breast cancer Neg Hx     Social History   Socioeconomic History   Marital status: Married    Spouse name: Not on file   Number of children: Not on file   Years of education: Not on file   Highest education level: Not on file  Occupational History   Not on file  Tobacco Use   Smoking status: Every Day    Packs/day: 0.50    Years: 34.00    Additional pack years: 0.00    Total pack years: 17.00    Types: Cigarettes   Smokeless tobacco: Never  Vaping Use   Vaping Use: Never used  Substance and Sexual Activity   Alcohol use: Yes    Comment: occassional   Drug use: No   Sexual activity: Not on file  Other Topics Concern   Not on file  Social History Narrative   Not on file   Social Determinants of Health   Financial Resource Strain: Not on file  Food Insecurity: Not on file  Transportation Needs: Not on file  Physical Activity: Not on file   Stress: Not on file  Social Connections: Not on file  Intimate Partner Violence: Not on file    Outpatient Medications Prior to Visit  Medication Sig Dispense Refill   aspirin EC 81 MG tablet Take 1 tablet (81 mg total) by mouth daily. 60 tablet 3   atorvastatin (LIPITOR) 40 MG tablet Take 1 tablet (40 mg total) by mouth daily at 6 PM. 90 tablet 3   escitalopram (LEXAPRO) 10 MG tablet Take 1 tablet (10 mg total) by mouth daily. 30 tablet 1   ezetimibe (ZETIA) 10 MG tablet Take 1 tablet (10 mg total) by mouth daily. 90 tablet 3   levothyroxine (EUTHYROX) 100 MCG tablet Take 1 tablet (100 mcg total) by mouth daily before breakfast. 90 tablet 1   traZODone (DESYREL) 50 MG tablet Take 0.5-1 tablets (25-50 mg total) by mouth at bedtime as needed for sleep. 30 tablet 3   acetaminophen-codeine (TYLENOL #3) 300-30 MG tablet Take 1 tablet by mouth every 4 (four) hours as needed for moderate pain. 60 tablet 1   cyclobenzaprine (FLEXERIL) 5 MG tablet Take 1 tablet (5 mg total) by mouth at bedtime. 30 tablet 1   No facility-administered medications prior to visit.    Allergies  Allergen Reactions   Sulfa Antibiotics Hives    ROS Review of Systems  Constitutional: Negative.   HENT: Negative.  Negative for ear pain, postnasal drip, rhinorrhea, sinus pressure, sore throat, trouble swallowing and voice change.   Eyes:  Negative for visual disturbance.  Respiratory: Negative.  Negative for apnea, cough, choking, chest tightness, shortness of breath, wheezing and stridor.   Cardiovascular: Negative.  Negative for chest pain, palpitations and leg swelling.  Gastrointestinal: Negative.  Negative for abdominal distention, abdominal pain, nausea and vomiting.  Genitourinary: Negative.   Musculoskeletal:  Positive for neck pain. Negative for arthralgias, joint swelling and myalgias.       Right shoulder pain  Skin:  Negative for rash.  Allergic/Immunologic: Negative.  Negative for environmental  allergies and food allergies.  Neurological: Negative.  Negative for dizziness, syncope, weakness and headaches.  Hematological: Negative.  Negative for adenopathy. Does not bruise/bleed easily.  Psychiatric/Behavioral:  Positive for decreased concentration, dysphoric mood and sleep disturbance. Negative for agitation,  behavioral problems, confusion, hallucinations, self-injury and suicidal ideas. The patient is nervous/anxious. The patient is not hyperactive.       Objective:    Vitals:   04/06/23 0844  BP: 118/78  Pulse: 75  Temp: 98.3 F (36.8 C)  TempSrc: Oral  SpO2: 93%  Weight: 204 lb (92.5 kg)  Height: 5\' 8"  (1.727 m)    Gen: Pleasant, well-nourished, in no distress,  normal affect  ENT: No lesions,  mouth clear,  oropharynx clear, no postnasal drip  Neck: No JVD, no TMG, no carotid bruits  Lungs: No use of accessory muscles, no dullness to percussion, clear without rales or rhonchi  Cardiovascular: RRR, heart sounds normal, no murmur or gallops, no peripheral edema  Abdomen: soft and NT, no HSM,  BS normal  Musculoskeletal: Decreased range of motion right shoulder with tenderness at the apical aspect of the shoulder  Neuro: alert, non focal  Skin: Warm, no lesions or rashes  No results found.   BP 118/78 (BP Location: Left Arm, Patient Position: Sitting, Cuff Size: Large)   Pulse 75   Temp 98.3 F (36.8 C) (Oral)   Ht 5\' 8"  (1.727 m)   Wt 204 lb (92.5 kg)   SpO2 93%   BMI 31.02 kg/m  Wt Readings from Last 3 Encounters:  04/06/23 204 lb (92.5 kg)  02/03/23 212 lb (96.2 kg)  01/27/23 215 lb (97.5 kg)     There are no preventive care reminders to display for this patient.   There are no preventive care reminders to display for this patient.  Lab Results  Component Value Date   TSH 12.900 (H) 02/03/2023   Lab Results  Component Value Date   WBC 7.1 01/20/2023   HGB 11.9 (L) 01/20/2023   HCT 37.6 01/20/2023   MCV 74.8 (L) 01/20/2023   PLT  243 01/20/2023   Lab Results  Component Value Date   NA 137 01/20/2023   K 3.3 (L) 01/20/2023   CO2 25 01/20/2023   GLUCOSE 118 (H) 01/20/2023   BUN 12 01/20/2023   CREATININE 0.72 01/20/2023   BILITOT 0.6 05/25/2022   ALKPHOS 90 05/25/2022   AST 24 05/25/2022   ALT 17 05/25/2022   PROT 6.6 05/25/2022   ALBUMIN 3.7 05/25/2022   CALCIUM 8.5 (L) 01/20/2023   ANIONGAP 9 01/20/2023   EGFR 86 01/27/2022   Lab Results  Component Value Date   CHOL 237 (H) 02/03/2023   Lab Results  Component Value Date   HDL 53 02/03/2023   Lab Results  Component Value Date   LDLCALC 163 (H) 02/03/2023   Lab Results  Component Value Date   TRIG 116 02/03/2023   Lab Results  Component Value Date   CHOLHDL 4.5 (H) 02/03/2023   Lab Results  Component Value Date   HGBA1C 5.8 (A) 10/21/2020      Assessment & Plan:   Problem List Items Addressed This Visit       Cardiovascular and Mediastinum   Essential hypertension - Primary    Hypertension under improved control  No change in medications      Relevant Orders   Basic metabolic panel     Endocrine   Postoperative hypothyroidism    Maintain thyroid medication        Musculoskeletal and Integument   Rotator cuff impingement syndrome of right shoulder    Referral back to orthopedics will be made Will apply lidocaine to the right shoulder  Other   Tobacco use    Patient has resumed cigarette use and one pack will last her 3 days. Attributes to increased stress and pain with right shoulder tendenopathy. Recommended nicotine replacement therapy     Current smoking consumption amount: 1 pack every few days  Dicsussion on advise to quit smoking and smoking impacts: cv impacts  Patient's willingness to quit:  Wants to quit  Methods to quit smoking discussed:  Nicotine replacment  Medication management of smoking session drugs discussed: nicotine patch    Setting quit date  Not established  Follow-up  arranged  Dr Delford Field 3 months,   Time spent counseling the patient:  5 min        Reactive depression    Continue Lexapro      Mild episode of recurrent major depressive disorder (HCC)    Improved on Lexapro      Grief at loss of child    Patient has counseling      Meds ordered this encounter  Medications   acetaminophen-codeine (TYLENOL #3) 300-30 MG tablet    Sig: Take 1 tablet by mouth every 4 (four) hours as needed for moderate pain.    Dispense:  60 tablet    Refill:  1   cyclobenzaprine (FLEXERIL) 5 MG tablet    Sig: Take 1 tablet (5 mg total) by mouth at bedtime.    Dispense:  30 tablet    Refill:  1   lidocaine (LIDODERM) 5 %    Sig: Place 1 patch onto the skin daily. Remove & Discard patch within 12 hours or as directed by MD    Dispense:  30 patch    Refill:  4   nicotine polacrilex (NICORETTE MINI) 4 MG lozenge    Sig: Use two times a day to reduce tobacco use    Dispense:  100 tablet    Refill:  4   Return in about 2 months (around 06/06/2023) for htn, chronic conditions, anxiety.   Shan Levans, MD

## 2023-04-06 NOTE — Telephone Encounter (Signed)
Requested medication (s) are due for refill today: No  Requested medication (s) are on the active medication list: yes    Last refill: 04/06/23  #60  1 refill  Future visit scheduled    yes}  Notes to clinic:Receipt confirmed by pharmacy 04/06/23 at 9:06. Cannot refuse non-delegated meds per protocol.  Requested Prescriptions  Pending Prescriptions Disp Refills   acetaminophen-codeine (TYLENOL #3) 300-30 MG tablet [Pharmacy Med Name: ACETAMI/CODEIN 300-30MG  TAB] 60 tablet 1    Sig: TAKE 1 TABLET BY MOUTH EVERY 4 HOURS AS NEEDED FOR MODERATE PAIN     Not Delegated - Analgesics:  Opioid Agonist Combinations 2 Failed - 04/06/2023 11:25 AM      Failed - This refill cannot be delegated      Failed - Urine Drug Screen completed in last 360 days      Passed - Cr in normal range and within 360 days    Creat  Date Value Ref Range Status  01/29/2014 0.69 0.50 - 1.10 mg/dL Final   Creatinine, Ser  Date Value Ref Range Status  01/20/2023 0.72 0.44 - 1.00 mg/dL Final         Passed - eGFR is 10 or above and within 360 days    GFR, Est African American  Date Value Ref Range Status  01/29/2014 >89 mL/min Final   GFR calc Af Amer  Date Value Ref Range Status  06/25/2020 >60 >60 mL/min Final   GFR, Est Non African American  Date Value Ref Range Status  01/29/2014 >89 mL/min Final    Comment:      The estimated GFR is a calculation valid for adults (>=66 years old) that uses the CKD-EPI algorithm to adjust for age and sex. It is   not to be used for children, pregnant women, hospitalized patients,    patients on dialysis, or with rapidly changing kidney function. According to the NKDEP, eGFR >89 is normal, 60-89 shows mild impairment, 30-59 shows moderate impairment, 15-29 shows severe impairment and <15 is ESRD.     GFR, Estimated  Date Value Ref Range Status  01/20/2023 >60 >60 mL/min Final    Comment:    (NOTE) Calculated using the CKD-EPI Creatinine Equation (2021)    eGFR   Date Value Ref Range Status  01/27/2022 86 >59 mL/min/1.73 Final         Passed - Patient is not pregnant      Passed - Valid encounter within last 3 months    Recent Outpatient Visits           Today Essential hypertension   [facility] Storm Frisk, MD   3 weeks ago Reactive depression   [place] [facility] Storm Frisk, MD   2 months ago Essential hypertension   [facility] Storm Frisk, MD   2 months ago Essential hypertension   [facility] [place], Cornelius Moras, RPH-CPP   3 months ago Essential hypertension   [facility] Drucilla Chalet, RPH-CPP       Future Appointments             In 2 months Delford Field, Charlcie Cradle, MD [facility]

## 2023-04-06 NOTE — Assessment & Plan Note (Signed)
Patient has counseling

## 2023-04-06 NOTE — Patient Instructions (Signed)
Go to the following office to apply for medicaid:  Medicaid office located 4th floor [address]   Refills on medications sent to pharmacy Try lidocaine patch to shoulder Blood pressure is good, no medications Labs : check potassium today Nicotine lozenges trial Return Dr Delford Field 2 months

## 2023-04-06 NOTE — Telephone Encounter (Signed)
Called and left patient a voicemail. To schedule with me on a Monday or Wednesday

## 2023-04-06 NOTE — Assessment & Plan Note (Signed)
Improved on Lexapro.

## 2023-04-06 NOTE — Assessment & Plan Note (Signed)
Referral back to orthopedics will be made Will apply lidocaine to the right shoulder

## 2023-04-06 NOTE — Assessment & Plan Note (Signed)
Patient has resumed cigarette use and one pack will last her 3 days. Attributes to increased stress and pain with right shoulder tendenopathy. Recommended nicotine replacement therapy     Current smoking consumption amount: 1 pack every few days  Dicsussion on advise to quit smoking and smoking impacts: cv impacts  Patient's willingness to quit:  Wants to quit  Methods to quit smoking discussed:  Nicotine replacment  Medication management of smoking session drugs discussed: nicotine patch    Setting quit date  Not established  Follow-up arranged  Dr Kelia Gibbon 3 months,   Time spent counseling the patient:  5 min   

## 2023-04-06 NOTE — Assessment & Plan Note (Signed)
Hypertension under improved control  No change in medications

## 2023-04-06 NOTE — Assessment & Plan Note (Signed)
Continue Lexapro

## 2023-04-06 NOTE — Assessment & Plan Note (Signed)
Maintain thyroid medication

## 2023-04-07 ENCOUNTER — Telehealth: Payer: Self-pay

## 2023-04-07 LAB — BASIC METABOLIC PANEL
BUN/Creatinine Ratio: 13 (ref 9–23)
BUN: 11 mg/dL (ref 6–24)
CO2: 22 mmol/L (ref 20–29)
Calcium: 8.4 mg/dL — ABNORMAL LOW (ref 8.7–10.2)
Chloride: 102 mmol/L (ref 96–106)
Creatinine, Ser: 0.85 mg/dL (ref 0.57–1.00)
Glucose: 94 mg/dL (ref 70–99)
Potassium: 3.9 mmol/L (ref 3.5–5.2)
Sodium: 142 mmol/L (ref 134–144)
eGFR: 80 mL/min/{1.73_m2} (ref >59)

## 2023-04-07 NOTE — Progress Notes (Signed)
Lab is normal potassium normal

## 2023-04-07 NOTE — Telephone Encounter (Signed)
-----   Message from Storm Frisk, MD sent at 04/07/2023  8:30 AM EDT ----- Lab is normal potassium normal

## 2023-04-07 NOTE — Telephone Encounter (Signed)
Pt was called and is aware of results, DOB was confirmed.  ?

## 2023-06-06 NOTE — Progress Notes (Signed)
Established Patient Office Visit  Subjective:  Patient ID: IRIANNA Flores, female    DOB: Oct 03, 1966  Age: 57 y.o. MRN: [id number]     CC:  Anxiety depression over son's murder  HPI 01/2022 Meagan Flores presents for primary care follow-up.  On arrival blood pressure is good 133/85.  Patient has undergone surgery of the right biceps tendon of the right shoulder due to tendinosis since I last saw her.  The surgery occurred in January and she is undergoing rehab now.  She still has some pain in the shoulder but is improving.  She has reduced her cigarette content down to 1 pack every week.  She has been visiting with our clinical social worker for counseling on depression and anxiety.  This has been beneficial to her.  She did have a viral type illness in January she did not get COVID tested.  This causes weakness fatigue and blurry vision.  She still has cloudiness to her vision in the left eye.  She does not see an eye doctor.  She is trying to apply for disability but has not yet been improved.  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  Patient has no other real complaints at this time.  She needs refills on all her medications.  She has been compliant with her blood pressure cholesterol and thyroid medicines.  She states the Temovate cream helped her rash on her arms.  7/3 This patient seen in return follow-up and she went to the emergency room on 27 June for abdominal pain and chest pain.  Extensive work-up done in the emergency room was negative.  No evidence of heart attack.  EKG chest x-ray is normal labs are normal.  Patient had mild elevation in blood pressure.  She received a GI cocktail symptoms went away.  She was given prescriptions for Carafate and omeprazole but she did not fill these as these were too expensive for her.  Patient's disability has not gone through  Patient's [place] discount has not yet been approved  Patient needs refills on all  medications and prefers to use [facility] over our pharmacy.  Patient has upcoming appointments with orthopedics for further injections in the shoulder joint.  On arrival blood pressure is good 133/89.  She needs more cream for her eczema on the forearms.  12/6 Patient seen return follow-up for hypertension hypothyroidism hyperlipidemia prior stroke tobacco use  At the last visit her LDL was 172 and she was not taking atorvastatin she has been taking it since and needs refills.  She states the eczema on her hand is improved and she cannot afford the Temovate she is using skin moisturizer.  On arrival blood pressure elevated 138/93 despite being on valsartan 160 mg daily and metoprolol 50 mg daily.  She is smoking a pack of cigarettes every 3 days.  She also has severe tendinitis of the right shoulder she would benefit from an injection she went to rehab medicine however she cannot afford the $900 and she is uninsured.  She states Tylenol 3 is helped in the past meloxicam caused nausea and vomiting Flexeril helps at bedtime  02/03/23 This patient seen in return follow-up on arrival blood pressure is 125/78.  She needs a follow-up lipid panel.  She is continuing to have right arm pain which is neuropathic in nature.  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  Patient is under a lot of stress with this.  Initially blood pressure was elevated on arrival but it came down with rest.  She has been seen by physical medicine and rehab and they are entertaining a topical patch therapy program for pain. Patient is still trying to get disability  03/15/23 Patient seen today by way of a telephone visit.  Tragically she lost her son who was murdered by a gang on March 23.  This was in [place].  The patient has continued to have  severe anxiety not eating not able to sleep.  This is superimposed upon chronic right shoulder pain.  She was seeing physical therapy and physical medicine and rehab.  They recommended topical therapy.  She cannot afford this.  She will need patient assistance.  5/8 Patient seen in return follow-up face-to-face after a telephone visit in April.  She is still depressed over her son's murder.  She is getting counseling.  She complains of increased pain in the right arm generated by chronic neck and right shoulder rotator cuff conditions.  She still smokes a pack a day of cigarettes and has had elevations in blood pressure.  Today on arrival blood pressure improved at 119/78.  She needs a follow-up metabolic panel.  She is looking for other options.  She was with physical medicine and rehab and they recommended a very expensive medication treatment she cannot go back to them because she owes $900 and she does not have Medicaid.  No other medication changes are needed.  06/07/23 This patient returns in follow-up she still suffering grief from the loss of her son.  She is in a grief counseling group and is improving with this.  Also on the Lexapro with some improvement.  She still smoking a pack of cigarettes a day and she has yet to pick up the nicotine lozenge.  She states she has occasional itching in the groin area but is improving.  Blood pressure on arrival 128/78.  She maintains her blood pressure medications without change.  She continues to suffer from chronic right shoulder pain and has follow-up with orthopedics.  The lidocaine patch does help.  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   COVID-19 virus infection 02/14/2020   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) tear of right shoulder 09/30/2020   Essential hypertension    Glenohumeral arthritis 12/27/2014    History of stroke    History of TIA (transient ischemic attack) 02/14/2020   Hyperlipidemia    Hypothyroidism    Impingement syndrome of right shoulder 12/24/2016   Mixed hyperlipidemia    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   Prediabetes    Preop cardiovascular exam 11/25/2020   Pulmonary hypertension, unspecified (HCC) 11/25/2020   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   Rotator cuff impingement syndrome of right shoulder 09/30/2020   Skin rash 02/17/2021   Stroke [facility])    Tendinopathy of right biceps tendon 09/30/2020   Thyroid disease    TIA (transient ischemic attack) 04/15/2018   Tobacco use 09/28/2016    Past Surgical History:  Procedure Laterality Date   BICEPT TENODESIS Right 12/16/2021   Procedure: RIGHT BICEPS TENODESIS;  Surgeon: Tarry Kos, MD;  Location: [facility];  Service: Orthopedics;  Laterality: Right;   PARTIAL HYSTERECTOMY  right arm sx     SHOULDER ARTHROSCOPY WITH BICEPSTENOTOMY Right 01/21/2021   Procedure: SHOULDER ARTHROSCOPY WITH BICEPSTENOTOMY;  Surgeon: Tarry Kos, MD;  Location: [facility];  Service: Orthopedics;  Laterality: Right;   SHOULDER ARTHROSCOPY WITH DISTAL CLAVICLE RESECTION Right 01/12/2017   Procedure: RIGHT SHOULDER ARTHROSCOPY WITH  SUBACROMIAL DECOMPRESSION, DISTAL CLAVICLE EXCISION, extensive debridement;  Surgeon: Tarry Kos, MD;  Location: [facility];  Service: Orthopedics;  Laterality: Right;   THYMECTOMY  2004   THYROIDECTOMY      Family History  Problem Relation Age of Onset   Stomach cancer Sister    Colon polyps Sister    Esophageal cancer Neg Hx    Rectal cancer Neg Hx    Colon cancer Neg Hx    Breast cancer Neg Hx     Social History   Socioeconomic History   Marital status: Married    Spouse name: Not on file   Number of children: Not on file   Years of education: Not on file   Highest education level: Not on file  Occupational History   Not on file  Tobacco Use    Smoking status: Every Day    Packs/day: 0.50    Years: 34.00    Additional pack years: 0.00    Total pack years: 17.00    Types: Cigarettes   Smokeless tobacco: Never  Vaping Use   Vaping Use: Never used  Substance and Sexual Activity   Alcohol use: Yes    Comment: occassional   Drug use: No   Sexual activity: Not on file  Other Topics Concern   Not on file  Social History Narrative   Not on file   Social Determinants of Health   Financial Resource Strain: Not on file  Food Insecurity: Not on file  Transportation Needs: Not on file  Physical Activity: Not on file  Stress: Not on file  Social Connections: Not on file  Intimate Partner Violence: Not on file    Outpatient Medications Prior to Visit  Medication Sig Dispense Refill   acetaminophen-codeine (TYLENOL #3) 300-30 MG tablet Take 1 tablet by mouth every 4 (four) hours as needed for moderate pain or severe pain. 60 tablet 1   aspirin EC 81 MG tablet Take 1 tablet (81 mg total) by mouth daily. 60 tablet 3   atorvastatin (LIPITOR) 40 MG tablet Take 1 tablet (40 mg total) by mouth daily at 6 PM. 90 tablet 3   cyclobenzaprine (FLEXERIL) 5 MG tablet Take 1 tablet (5 mg total) by mouth at bedtime. 30 tablet 1   escitalopram (LEXAPRO) 10 MG tablet Take 1 tablet (10 mg total) by mouth daily. 30 tablet 1   ezetimibe (ZETIA) 10 MG tablet Take 1 tablet (10 mg total) by mouth daily. 90 tablet 3   levothyroxine (EUTHYROX) 100 MCG tablet Take 1 tablet (100 mcg total) by mouth daily before breakfast. 90 tablet 1   lidocaine (LIDODERM) 5 % Place 1 patch onto the skin daily. Remove & Discard patch within 12 hours or as directed by MD 30 patch 4   nicotine polacrilex (NICORETTE MINI) 4 MG lozenge Use two times a day to reduce tobacco use 100 tablet 4   traZODone (DESYREL) 50 MG tablet Take 0.5-1 tablets (25-50 mg total) by mouth at bedtime as needed for sleep. 30 tablet 3   No facility-administered medications prior to visit.     Allergies  Allergen Reactions   Sulfa Antibiotics Hives  ROS Review of Systems  Constitutional: Negative.   HENT: Negative.  Negative for ear pain, postnasal drip, rhinorrhea, sinus pressure, sore throat, trouble swallowing and voice change.   Eyes:  Negative for visual disturbance.  Respiratory: Negative.  Negative for apnea, cough, choking, chest tightness, shortness of breath, wheezing and stridor.   Cardiovascular: Negative.  Negative for chest pain, palpitations and leg swelling.  Gastrointestinal: Negative.  Negative for abdominal distention, abdominal pain, nausea and vomiting.  Genitourinary: Negative.   Musculoskeletal:  Positive for neck pain. Negative for arthralgias, joint swelling and myalgias.       Right shoulder pain  Skin:  Negative for rash.  Allergic/Immunologic: Negative.  Negative for environmental allergies and food allergies.  Neurological: Negative.  Negative for dizziness, syncope, weakness and headaches.  Hematological: Negative.  Negative for adenopathy. Does not bruise/bleed easily.  Psychiatric/Behavioral:  Positive for decreased concentration, dysphoric mood and sleep disturbance. Negative for agitation, behavioral problems, confusion, hallucinations, self-injury and suicidal ideas. The patient is nervous/anxious. The patient is not hyperactive.       Objective:    Vitals:   06/07/23 0841 06/07/23 0856  BP: 138/89 128/78  Pulse: 67   SpO2: 94%   Weight: 204 lb 9.6 oz (92.8 kg)     Gen: Pleasant, obese in no distress, depressed affect  ENT: No lesions,  mouth clear,  oropharynx clear, no postnasal drip  Neck: No JVD, no TMG, no carotid bruits  Lungs: No use of accessory muscles, no dullness to percussion, clear without rales or rhonchi  Cardiovascular: RRR, heart sounds normal, no murmur or gallops, no peripheral edema  Abdomen: soft and NT, no HSM,  BS normal  Musculoskeletal: Decreased range of motion right shoulder with tenderness  at the apical aspect of the shoulder  Neuro: alert, non focal  Skin: Warm, no lesions or rashes  No results found.   BP 128/78   Pulse 67   Wt 204 lb 9.6 oz (92.8 kg)   SpO2 94%   BMI 31.11 kg/m  Wt Readings from Last 3 Encounters:  06/07/23 204 lb 9.6 oz (92.8 kg)  04/06/23 204 lb (92.5 kg)  02/03/23 212 lb (96.2 kg)     There are no preventive care reminders to display for this patient.   There are no preventive care reminders to display for this patient.  Lab Results  Component Value Date   TSH 12.900 (H) 02/03/2023   Lab Results  Component Value Date   WBC 7.1 01/20/2023   HGB 11.9 (L) 01/20/2023   HCT 37.6 01/20/2023   MCV 74.8 (L) 01/20/2023   PLT 243 01/20/2023   Lab Results  Component Value Date   NA 142 04/06/2023   K 3.9 04/06/2023   CO2 22 04/06/2023   GLUCOSE 94 04/06/2023   BUN 11 04/06/2023   CREATININE 0.85 04/06/2023   BILITOT 0.6 05/25/2022   ALKPHOS 90 05/25/2022   AST 24 05/25/2022   ALT 17 05/25/2022   PROT 6.6 05/25/2022   ALBUMIN 3.7 05/25/2022   CALCIUM 8.4 (L) 04/06/2023   ANIONGAP 9 01/20/2023   EGFR 80 04/06/2023   Lab Results  Component Value Date   CHOL 237 (H) 02/03/2023   Lab Results  Component Value Date   HDL 53 02/03/2023   Lab Results  Component Value Date   LDLCALC 163 (H) 02/03/2023   Lab Results  Component Value Date   TRIG 116 02/03/2023   Lab Results  Component Value Date   CHOLHDL 4.5 (  H) 02/03/2023   Lab Results  Component Value Date   HGBA1C 5.8 (A) 10/21/2020      Assessment & Plan:   Problem List Items Addressed This Visit       Cardiovascular and Mediastinum   Essential hypertension    Well-controlled we will continue with no medication and we will continue observation with lifestyle management approach and smoking cessation        Endocrine   Postoperative hypothyroidism    Thyroid function at baseline continue Synthroid        Musculoskeletal and Integument   Impingement  syndrome of right shoulder - Primary   Relevant Orders   Ambulatory referral to Orthopedic Surgery   Rotator cuff impingement syndrome of right shoulder    Has follow-up with orthopedics        Other   Mixed hyperlipidemia    Lipids at goal continue atorvastatin and Zetia      Tobacco use    Patient has resumed cigarette use and one pack will last her 3 days. Attributes to increased stress and pain with right shoulder tendenopathy. Recommended nicotine replacement therapy     Current smoking consumption amount: 1 pack every few days  Dicsussion on advise to quit smoking and smoking impacts: cv impacts  Patient's willingness to quit:  Wants to quit  Methods to quit smoking discussed:  Nicotine replacment  Medication management of smoking session drugs discussed: nicotine patch    Setting quit date  Not established  Follow-up arranged  Dr Delford Field 3 months,   Time spent counseling the patient:  5 min        Mild episode of recurrent major depressive disorder (HCC)    Continue with counseling and Lexapro     No orders of the defined types were placed in this encounter.  Return in about 4 months (around 10/08/2023) for htn.   Shan Levans, MD

## 2023-06-07 ENCOUNTER — Ambulatory Visit: Payer: Commercial Managed Care - HMO | Attending: Critical Care Medicine | Admitting: Critical Care Medicine

## 2023-06-07 ENCOUNTER — Encounter: Payer: Self-pay | Admitting: Critical Care Medicine

## 2023-06-07 VITALS — BP 128/78 | HR 67 | Wt 204.6 lb

## 2023-06-07 DIAGNOSIS — F17210 Nicotine dependence, cigarettes, uncomplicated: Secondary | ICD-10-CM | POA: Insufficient documentation

## 2023-06-07 DIAGNOSIS — I10 Essential (primary) hypertension: Secondary | ICD-10-CM | POA: Insufficient documentation

## 2023-06-07 DIAGNOSIS — F330 Major depressive disorder, recurrent, mild: Secondary | ICD-10-CM | POA: Insufficient documentation

## 2023-06-07 DIAGNOSIS — Z720 Tobacco use: Secondary | ICD-10-CM | POA: Diagnosis not present

## 2023-06-07 DIAGNOSIS — E782 Mixed hyperlipidemia: Secondary | ICD-10-CM | POA: Diagnosis not present

## 2023-06-07 DIAGNOSIS — E890 Postprocedural hypothyroidism: Secondary | ICD-10-CM | POA: Insufficient documentation

## 2023-06-07 DIAGNOSIS — M7541 Impingement syndrome of right shoulder: Secondary | ICD-10-CM | POA: Diagnosis not present

## 2023-06-07 DIAGNOSIS — Z79899 Other long term (current) drug therapy: Secondary | ICD-10-CM | POA: Diagnosis not present

## 2023-06-07 NOTE — Assessment & Plan Note (Signed)
Thyroid function at baseline continue Synthroid

## 2023-06-07 NOTE — Assessment & Plan Note (Signed)
Lipids at goal continue atorvastatin and Zetia

## 2023-06-07 NOTE — Assessment & Plan Note (Signed)
Continue with counseling and Lexapro

## 2023-06-07 NOTE — Assessment & Plan Note (Signed)
Patient has resumed cigarette use and one pack will last her 3 days. Attributes to increased stress and pain with right shoulder tendenopathy. Recommended nicotine replacement therapy     Current smoking consumption amount: 1 pack every few days  Dicsussion on advise to quit smoking and smoking impacts: cv impacts  Patient's willingness to quit:  Wants to quit  Methods to quit smoking discussed:  Nicotine replacment  Medication management of smoking session drugs discussed: nicotine patch    Setting quit date  Not established  Follow-up arranged  Dr Trinh Sanjose 3 months,   Time spent counseling the patient:  5 min   

## 2023-06-07 NOTE — Patient Instructions (Signed)
Continue grief counseling Ortho referral made Stop trazadone No other medication changes Pick up nicotine lozenge OTC use three times daily  get the 4mg  dose Return 4 months

## 2023-06-07 NOTE — Assessment & Plan Note (Signed)
Has follow-up with orthopedics

## 2023-06-07 NOTE — Assessment & Plan Note (Signed)
Well-controlled we will continue with no medication and we will continue observation with lifestyle management approach and smoking cessation

## 2023-06-15 ENCOUNTER — Ambulatory Visit: Payer: Commercial Managed Care - HMO | Admitting: Orthopaedic Surgery

## 2023-06-15 DIAGNOSIS — M25511 Pain in right shoulder: Secondary | ICD-10-CM

## 2023-06-15 DIAGNOSIS — G8929 Other chronic pain: Secondary | ICD-10-CM | POA: Diagnosis not present

## 2023-06-15 DIAGNOSIS — Z9889 Other specified postprocedural states: Secondary | ICD-10-CM

## 2023-06-15 NOTE — Progress Notes (Signed)
Office Visit Note   Patient: Meagan Flores           Date of Birth: January 21, 1966           MRN: [id number] Visit Date: 06/15/2023              Requested by: Storm Frisk, MD [address] [place],  [place] [address] PCP: Storm Frisk, MD   Assessment & Plan: Visit Diagnoses:  1. S/P arthroscopy of right shoulder   2. Chronic right shoulder pain     Plan: Impression is chronic right shoulder pain following 3 right shoulder surgeries to include arthroscopic decompression, debridement most recently biceps tenodesis.  At this point, we have exhausted all orthopedic interventions that I can think of.  I do not have an explanation for why she continues to have pain from an orthopedic perspective.  We have recommended following up with pain management.  Follow-up as needed.  Total face to face encounter time was greater than 25 minutes and over half of this time was spent in counseling and/or coordination of care.  Follow-Up Instructions: Return if symptoms worsen or fail to improve.   Orders:  No orders of the defined types were placed in this encounter.  No orders of the defined types were placed in this encounter.     Procedures: No procedures performed   Clinical Data: No additional findings.   Subjective: Chief Complaint  Patient presents with   Right Shoulder - Pain    HPI patient is a pleasant 57 year old female who comes in today with chronic right shoulder pain.  She is status post right shoulder biceps tenodesis 12/16/2021.  She has had continued pain since.  She went on to develop adhesive capsulitis where she underwent glenohumeral cortisone injection followed by continuation of physical therapy.  She has failed to improve with pain and range of motion.  She has been seen by for this multiple times and surgery.  It was recommended at her last visit that she stopped physical therapy and see pain management.  She notes that due to financial  constraints she is not been able to follow-up with pain management.  She continues to endorse pain to the anterior shoulder worse with abduction and external rotation.  She has been using prescription lidocaine patches and taking Tylenol 3 without significant relief.  Review of Systems as detailed in HPI.  All others reviewed and are negative.   Objective: Vital Signs: There were no vitals taken for this visit.  Physical Exam well-developed well-nourished female no acute distress.  Alert and oriented x 3.  Ortho Exam right shoulder exam reveals active forward flexion to approximately 90 degrees.  I can passively get her to about 160 degrees.  45 degrees of external rotation.  She can internally rotate to her back pocket.  She does have pain with O'Brien's test.  Negative speeds negative empty can testing.  Near full strength throughout.  She is neurovascular intact distally.  Specialty Comments:  No specialty comments available.  Imaging: No new imaging   PMFS History: Patient Active Problem List   Diagnosis Date Noted   Mild episode of recurrent major depressive disorder (HCC) 03/23/2023   Grief at loss of child 03/23/2023   Chronic right shoulder pain 07/01/2022   BMI 33.0-33.9,adult 05/31/2022   Biceps tendonosis of right shoulder 02/10/2022   S/P arthroscopy of right shoulder 02/10/2022   Skin rash 02/17/2021   Pulmonary hypertension, unspecified (HCC) 11/25/2020  Prediabetes    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) tear of right shoulder 09/30/2020   Rotator cuff impingement syndrome of right shoulder 09/30/2020   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   Tendinopathy of right biceps tendon 09/30/2020   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   Aneurysm (HCC) 03/18/2020   History of TIA (transient ischemic attack) 02/14/2020   History of stroke    Mixed hyperlipidemia    Essential hypertension    Impingement syndrome of right shoulder 12/24/2016    Tobacco use 09/28/2016   Glenohumeral arthritis 12/27/2014   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   Prediabetes    Preop cardiovascular exam 11/25/2020   Pulmonary hypertension, unspecified (HCC) 11/25/2020   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   Rotator cuff impingement syndrome of right shoulder 09/30/2020   Skin rash 02/17/2021   Stroke (HCC)    Tendinopathy of right biceps tendon 09/30/2020   Thyroid disease    TIA (transient ischemic attack) 04/15/2018   Tobacco use 09/28/2016    Family History  Problem Relation Age of Onset   Stomach cancer Sister    Colon polyps Sister    Esophageal cancer Neg Hx    Rectal cancer Neg Hx    Colon cancer Neg Hx    Breast cancer Neg Hx     Past Surgical History:  Procedure Laterality Date   BICEPT TENODESIS Right 12/16/2021   Procedure: RIGHT BICEPS TENODESIS;  Surgeon: Tarry Kos, MD;  Location: [facility];  Service: Orthopedics;  Laterality: Right;   PARTIAL HYSTERECTOMY     right arm sx     SHOULDER ARTHROSCOPY WITH BICEPSTENOTOMY Right 01/21/2021   Procedure: SHOULDER ARTHROSCOPY WITH BICEPSTENOTOMY;  Surgeon: Tarry Kos, MD;  Location: [facility];  Service: Orthopedics;  Laterality: Right;   SHOULDER ARTHROSCOPY WITH DISTAL CLAVICLE RESECTION Right 01/12/2017   Procedure: RIGHT SHOULDER ARTHROSCOPY WITH  SUBACROMIAL  DECOMPRESSION, DISTAL CLAVICLE EXCISION, extensive debridement;  Surgeon: Tarry Kos, MD;  Location: [facility];  Service: Orthopedics;  Laterality: Right;   THYMECTOMY  2004   THYROIDECTOMY     Social History   Occupational History   Not on file  Tobacco Use   Smoking status: Every Day    Current packs/day: 0.50    Average packs/day: 0.5 packs/day for 34.0 years (17.0 ttl pk-yrs)    Types: Cigarettes   Smokeless tobacco: Never  Vaping Use   Vaping status: Never Used  Substance and Sexual Activity   Alcohol use: Yes    Comment: occassional   Drug use: No   Sexual activity: Not on file

## 2023-06-28 ENCOUNTER — Encounter: Payer: Commercial Managed Care - HMO | Admitting: Physical Medicine & Rehabilitation

## 2023-07-19 ENCOUNTER — Encounter
Payer: Commercial Managed Care - HMO | Attending: Physical Medicine & Rehabilitation | Admitting: Physical Medicine & Rehabilitation

## 2023-07-19 ENCOUNTER — Encounter: Payer: Self-pay | Admitting: Physical Medicine & Rehabilitation

## 2023-07-19 VITALS — BP 139/91 | HR 65 | Ht 68.0 in | Wt 204.0 lb

## 2023-07-19 DIAGNOSIS — M19019 Primary osteoarthritis, unspecified shoulder: Secondary | ICD-10-CM

## 2023-07-19 DIAGNOSIS — M7541 Impingement syndrome of right shoulder: Secondary | ICD-10-CM

## 2023-07-19 MED ORDER — PREGABALIN 50 MG PO CAPS: 50.0000 mg | ORAL_CAPSULE | Freq: Three times a day (TID) | ORAL | 1 refills | Status: DC

## 2023-07-19 NOTE — Progress Notes (Signed)
Subjective:    Patient ID: Meagan Flores, female    DOB: 03/10/1966, 57 y.o.   MRN: [id number]  HPI 57 year old female followed at this clinic since October 2023 for chronic shoulder pain.  She has had 3 shoulder surgeries in the past 57 yo female with ~5 yr hx of Right shoulder pain , underwent Right distal clavicular resection per Dr [name] in 2018, RIght biceps tenotomy and labral repair in 2022, Biceps tenodesis in 2023.  Pt had PT after each surgery.  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  We discussed that there may be other jobs that she can do that do not require as much movement in the right upper extremity.  She states that she has filed for disability about 2 years ago but has not heard.  In addition we discussed vocational rehab counseling she did not wish to pursue this at the current time. Missed her last appointment at this clinic due to death of her son. Pain Inventory Average Pain 7 Pain Right Now 6 My pain is aching  In the last 24 hours, has pain interfered with the following? General activity 0 Relation with others 0 Enjoyment of life 0 What TIME of day is your pain at its worst? night Sleep (in general) Poor  Pain is worse with:  lifting Pain improves with: medication Relief from Meds:  na  Family History  Problem Relation Age of Onset   Stomach cancer Sister    Colon polyps Sister    Esophageal cancer Neg Hx    Rectal cancer Neg Hx    Colon cancer Neg Hx    Breast cancer Neg Hx    Social History   Socioeconomic History   Marital status: Married    Spouse name: Not on file   Number of children: Not on file   Years of education: Not on file   Highest education level: Not on file  Occupational History   Not on file  Tobacco Use   Smoking status: Every Day    Current packs/day: 0.50    Average packs/day: 0.5 packs/day for 34.0 years (17.0 ttl pk-yrs)     Types: Cigarettes   Smokeless tobacco: Never  Vaping Use   Vaping status: Never Used  Substance and Sexual Activity   Alcohol use: Yes    Comment: occassional   Drug use: No   Sexual activity: Not on file  Other Topics Concern   Not on file  Social History Narrative   Not on file   Social Determinants of Health   Financial Resource Strain: Not on file  Food Insecurity: Not on file  Transportation Needs: Not on file  Physical Activity: Not on file  Stress: Not on file  Social Connections: Unknown (02/20/2023)   Received from [facility]   Social Network    Social Network: Not on file   Past Surgical History:  Procedure Laterality Date   BICEPT TENODESIS Right 12/16/2021   Procedure: RIGHT BICEPS TENODESIS;  Surgeon: Tarry Kos, MD;  Location: [facility];  Service: Orthopedics;  Laterality: Right;   PARTIAL HYSTERECTOMY     right arm sx     SHOULDER ARTHROSCOPY WITH BICEPSTENOTOMY Right 01/21/2021   Procedure: SHOULDER ARTHROSCOPY WITH BICEPSTENOTOMY;  Surgeon: Tarry Kos, MD;  Location: [facility];  Service: Orthopedics;  Laterality: Right;   SHOULDER ARTHROSCOPY WITH DISTAL  CLAVICLE RESECTION Right 01/12/2017   Procedure: RIGHT SHOULDER ARTHROSCOPY WITH  SUBACROMIAL DECOMPRESSION, DISTAL CLAVICLE EXCISION, extensive debridement;  Surgeon: Tarry Kos, MD;  Location: [facility];  Service: Orthopedics;  Laterality: Right;   THYMECTOMY  2004   THYROIDECTOMY     Past Surgical History:  Procedure Laterality Date   BICEPT TENODESIS Right 12/16/2021   Procedure: RIGHT BICEPS TENODESIS;  Surgeon: Tarry Kos, MD;  Location: [facility];  Service: Orthopedics;  Laterality: Right;   PARTIAL HYSTERECTOMY     right arm sx     SHOULDER ARTHROSCOPY WITH BICEPSTENOTOMY Right 01/21/2021   Procedure: SHOULDER ARTHROSCOPY WITH BICEPSTENOTOMY;  Surgeon: Tarry Kos, MD;  Location: [facility];  Service:  Orthopedics;  Laterality: Right;   SHOULDER ARTHROSCOPY WITH DISTAL CLAVICLE RESECTION Right 01/12/2017   Procedure: RIGHT SHOULDER ARTHROSCOPY WITH  SUBACROMIAL DECOMPRESSION, DISTAL CLAVICLE EXCISION, extensive debridement;  Surgeon: Tarry Kos, MD;  Location: [facility];  Service: Orthopedics;  Laterality: Right;   THYMECTOMY  2004   THYROIDECTOMY     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   Prediabetes    Preop cardiovascular exam 11/25/2020   Pulmonary hypertension, unspecified (HCC) 11/25/2020   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   Rotator cuff impingement syndrome of right shoulder 09/30/2020   Skin rash 02/17/2021   Stroke [facility])    Tendinopathy of right biceps tendon 09/30/2020   Thyroid disease    TIA (transient ischemic attack) 04/15/2018   Tobacco use 09/28/2016   BP (!) 139/91   Pulse 65   Ht 5\' 8"  (1.727 m)   Wt 204 lb (92.5 kg)   SpO2 96%   BMI 31.02 kg/m   Opioid Risk Score:   Fall Risk Score:  `1  Depression screen [facility] 2/9     06/07/2023    8:42 AM 04/06/2023    9:13 AM 02/03/2023    9:23 AM 01/27/2023    9:34 AM 12/09/2022   11:40 AM 11/03/2022    8:44 AM 10/19/2022   10:24 AM  Depression screen PHQ 2/9  Decreased Interest 2 1 1 1 1 1 1   Down, Depressed, Hopeless 1 1  1 1  0 1  PHQ - 2 Score 3 2 1 2 2 1 2   Altered sleeping 1 1       Tired, decreased energy 0 1       Change in appetite 1 1       Feeling bad or  failure about yourself  0 0 0      Trouble concentrating 0 0 0      Moving slowly or fidgety/restless 0 0 0      Suicidal thoughts 0 0 0      PHQ-9 Score 5 5          Review of Systems  Musculoskeletal:        Right shoulder pain  Neurological:  Positive for weakness.  All other  systems reviewed and are negative.     Objective:   Physical Exam Vitals and nursing note reviewed.  Constitutional:      Appearance: She is obese.  HENT:     Head: Normocephalic and atraumatic.  Eyes:     Extraocular Movements: Extraocular movements intact.     Conjunctiva/sclera: Conjunctivae normal.     Pupils: Pupils are equal, round, and reactive to light.  Musculoskeletal:     Comments: Right shoulder positive impingement sign at 90 degrees.  She also has pain at 90 degrees of forward flexion.  No pain with passive external rotation of the right upper extremity. No evidence of crepitus on examination. No pain with cervical spine range of motion.   Neurological:     General: No focal deficit present.     Mental Status: She is alert.  Psychiatric:        Attention and Perception: Attention normal.        Mood and Affect: Mood is depressed.        Speech: Speech normal.        Behavior: Behavior normal.        Cognition and Memory: Cognition normal.           Assessment & Plan:   1.  Chronic shoulder pain multifactorial has degenerative rotator cuff disease as well as glenohumeral osteoarthritis.  We discussed that there is no medication or injection that can buy to 100% pain relief.  We also discussed that her pain is chronic she is not pursuing any further surgery at this time.  She would like to avoid addictive medications. Will give a trial of pregabalin 50 mg twice daily.  She states she did not tolerate gabapentin very well in the past.  Other drug allergies are sulfa. She is on Tylenol 3 prescribed by primary care. We discussed that we may need to titrate her pregabalin dose upward  if she does not get any good relief with the current dose.  Will also monitor for sedation and other side effect RTC 58mo

## 2023-08-18 ENCOUNTER — Encounter: Payer: Commercial Managed Care - HMO | Admitting: Physical Medicine & Rehabilitation

## 2023-08-22 ENCOUNTER — Other Ambulatory Visit: Payer: Self-pay

## 2023-08-22 ENCOUNTER — Emergency Department (HOSPITAL_COMMUNITY)
Admission: EM | Admit: 2023-08-22 | Discharge: 2023-08-22 | Disposition: A | Discharge status: Home / Self Care | Payer: Commercial Managed Care - HMO | Attending: Emergency Medicine | Admitting: Emergency Medicine

## 2023-08-22 ENCOUNTER — Encounter (HOSPITAL_COMMUNITY): Payer: Self-pay

## 2023-08-22 DIAGNOSIS — M545 Low back pain, unspecified: Secondary | ICD-10-CM | POA: Diagnosis not present

## 2023-08-22 DIAGNOSIS — Z7982 Long term (current) use of aspirin: Secondary | ICD-10-CM | POA: Insufficient documentation

## 2023-08-22 DIAGNOSIS — Y92410 Unspecified street and highway as the place of occurrence of the external cause: Secondary | ICD-10-CM | POA: Insufficient documentation

## 2023-08-22 DIAGNOSIS — M25511 Pain in right shoulder: Secondary | ICD-10-CM | POA: Insufficient documentation

## 2023-08-22 DIAGNOSIS — M542 Cervicalgia: Secondary | ICD-10-CM | POA: Insufficient documentation

## 2023-08-22 DIAGNOSIS — M7918 Myalgia, other site: Secondary | ICD-10-CM

## 2023-08-22 MED ORDER — MELOXICAM 15 MG PO TABS: 15.0000 mg | ORAL_TABLET | Freq: Every day | ORAL | 0 refills | Status: DC

## 2023-08-22 NOTE — Discharge Instructions (Addendum)
Warm compresses to sore muscles for 20 minutes at a time and follow with gentle stretching.  Recommend follow-up with your primary care provider.  He may benefit from referral to physical therapy if not improving.  Continue with your medications as prescribed by your provider.  Take Meloxicam as needed as prescribed for pain.
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
## 2023-08-30 ENCOUNTER — Ambulatory Visit: Payer: Managed Care, Other (non HMO) | Attending: Internal Medicine | Admitting: Internal Medicine

## 2023-08-30 ENCOUNTER — Encounter: Payer: Self-pay | Admitting: Internal Medicine

## 2023-08-30 VITALS — BP 139/84 | HR 64 | Temp 98.3°F | Ht 68.0 in | Wt 210.0 lb

## 2023-08-30 DIAGNOSIS — T148XXA Other injury of unspecified body region, initial encounter: Secondary | ICD-10-CM | POA: Diagnosis not present

## 2023-08-30 DIAGNOSIS — M5441 Lumbago with sciatica, right side: Secondary | ICD-10-CM

## 2023-08-30 DIAGNOSIS — M5442 Lumbago with sciatica, left side: Secondary | ICD-10-CM | POA: Diagnosis not present

## 2023-08-30 DIAGNOSIS — M546 Pain in thoracic spine: Secondary | ICD-10-CM

## 2023-08-30 MED ORDER — NAPROXEN 500 MG PO TABS: 500.0000 mg | ORAL_TABLET | Freq: Two times a day (BID) | ORAL | 0 refills | Status: DC

## 2023-08-30 MED ORDER — TRAMADOL HCL 50 MG PO TABS: 50.0000 mg | ORAL_TABLET | Freq: Three times a day (TID) | ORAL | 0 refills | Status: AC | PRN
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
## 2023-09-08 ENCOUNTER — Encounter: Payer: Self-pay | Admitting: Physical Medicine & Rehabilitation

## 2023-09-08 ENCOUNTER — Encounter
Payer: Commercial Managed Care - HMO | Attending: Physical Medicine & Rehabilitation | Admitting: Physical Medicine & Rehabilitation

## 2023-09-08 VITALS — BP 134/83 | HR 58 | Ht 68.0 in | Wt 204.0 lb

## 2023-09-08 DIAGNOSIS — M7501 Adhesive capsulitis of right shoulder: Secondary | ICD-10-CM | POA: Diagnosis not present

## 2023-09-08 NOTE — Patient Instructions (Signed)
[facility]  Please call [phone]- say you have prescription from MD   [address]  [place], [place] [address]
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
## 2023-09-25 ENCOUNTER — Emergency Department (HOSPITAL_BASED_OUTPATIENT_CLINIC_OR_DEPARTMENT_OTHER)
Admission: EM | Admit: 2023-09-25 | Discharge: 2023-09-25 | Disposition: A | Discharge status: Home / Self Care | Payer: Commercial Managed Care - HMO | Attending: Emergency Medicine | Admitting: Emergency Medicine

## 2023-09-25 ENCOUNTER — Encounter (HOSPITAL_BASED_OUTPATIENT_CLINIC_OR_DEPARTMENT_OTHER): Payer: Self-pay

## 2023-09-25 ENCOUNTER — Emergency Department (HOSPITAL_BASED_OUTPATIENT_CLINIC_OR_DEPARTMENT_OTHER): Payer: Commercial Managed Care - HMO

## 2023-09-25 ENCOUNTER — Other Ambulatory Visit: Payer: Self-pay

## 2023-09-25 DIAGNOSIS — Z79899 Other long term (current) drug therapy: Secondary | ICD-10-CM | POA: Diagnosis not present

## 2023-09-25 DIAGNOSIS — E039 Hypothyroidism, unspecified: Secondary | ICD-10-CM | POA: Insufficient documentation

## 2023-09-25 DIAGNOSIS — Z7982 Long term (current) use of aspirin: Secondary | ICD-10-CM | POA: Insufficient documentation

## 2023-09-25 DIAGNOSIS — M542 Cervicalgia: Secondary | ICD-10-CM | POA: Insufficient documentation

## 2023-09-25 DIAGNOSIS — Z79890 Hormone replacement therapy (postmenopausal): Secondary | ICD-10-CM | POA: Insufficient documentation

## 2023-09-25 DIAGNOSIS — Z8673 Personal history of transient ischemic attack (TIA), and cerebral infarction without residual deficits: Secondary | ICD-10-CM | POA: Insufficient documentation

## 2023-09-25 DIAGNOSIS — I10 Essential (primary) hypertension: Secondary | ICD-10-CM | POA: Insufficient documentation

## 2023-09-25 LAB — I-STAT VENOUS BLOOD GAS, ED
Acid-Base Excess: 2 mmol/L (ref 0.0–2.0)
Bicarbonate: 27.3 mmol/L (ref 20.0–28.0)
Calcium, Ion: 1.07 mmol/L — ABNORMAL LOW (ref 1.15–1.40)
HCT: 40 % (ref 36.0–46.0)
Hemoglobin: 13.6 g/dL (ref 12.0–15.0)
O2 Saturation: 83 %
Patient temperature: 98.3
Potassium: 4 mmol/L (ref 3.5–5.1)
Sodium: 141 mmol/L (ref 135–145)
TCO2: 29 mmol/L (ref 22–32)
pCO2, Ven: 44 mm[Hg] (ref 44–60)
pH, Ven: 7.401 (ref 7.25–7.43)
pO2, Ven: 48 mm[Hg] — ABNORMAL HIGH (ref 32–45)

## 2023-09-25 LAB — BASIC METABOLIC PANEL
Anion gap: 11 (ref 5–15)
BUN: 20 mg/dL (ref 6–20)
CO2: 24 mmol/L (ref 22–32)
Calcium: 8.2 mg/dL — ABNORMAL LOW (ref 8.9–10.3)
Chloride: 101 mmol/L (ref 98–111)
Creatinine, Ser: 0.78 mg/dL (ref 0.44–1.00)
GFR, Estimated: >60 mL/min (ref >60)
Glucose, Bld: 95 mg/dL (ref 70–99)
Potassium: 3.9 mmol/L (ref 3.5–5.1)
Sodium: 136 mmol/L (ref 135–145)

## 2023-09-25 LAB — I-STAT CHEM 8, ED
BUN: 21 mg/dL — ABNORMAL HIGH (ref 6–20)
Calcium, Ion: 1.04 mmol/L — ABNORMAL LOW (ref 1.15–1.40)
Chloride: 104 mmol/L (ref 98–111)
Creatinine, Ser: 0.9 mg/dL (ref 0.44–1.00)
Glucose, Bld: 90 mg/dL (ref 70–99)
HCT: 41 % (ref 36.0–46.0)
Hemoglobin: 13.9 g/dL (ref 12.0–15.0)
Potassium: 4 mmol/L (ref 3.5–5.1)
Sodium: 140 mmol/L (ref 135–145)
TCO2: 27 mmol/L (ref 22–32)

## 2023-09-25 MED ORDER — KETOROLAC TROMETHAMINE 15 MG/ML IJ SOLN
15.0000 mg | Freq: Once | INTRAMUSCULAR | Status: AC
  Administered 2023-09-25: 15 mg via INTRAMUSCULAR
  Filled 2023-09-25: qty 1

## 2023-09-25 MED ORDER — METHOCARBAMOL 500 MG PO TABS
1000.0000 mg | ORAL_TABLET | Freq: Once | ORAL | Status: AC
  Administered 2023-09-25: 1000 mg via ORAL
  Filled 2023-09-25: qty 2

## 2023-09-25 MED ORDER — IOHEXOL 350 MG/ML SOLN
75.0000 mL | Freq: Once | INTRAVENOUS | Status: AC | PRN
  Administered 2023-09-25: 75 mL via INTRAVENOUS

## 2023-09-25 MED ORDER — LIDOCAINE 5 % EX PTCH
1.0000 | MEDICATED_PATCH | CUTANEOUS | Status: DC
  Administered 2023-09-25: 1 via TRANSDERMAL
  Filled 2023-09-25: qty 1

## 2023-09-25 NOTE — ED Provider Notes (Signed)
[place] [facility] AT [facility] Provider Note   CSN: [id number] Arrival date & time: 09/25/23  1721     History  Chief Complaint  Patient presents with   Neck Injury    Meagan Flores is a 57 y.o. female.   Neck Injury  57 year old female history of pulmonary hypertension, prior TIA, hypothyroidism, hyperlipidemia, prediabetes presenting for neck pain.  Patient states she has had 2 days of neck pain.  She woke up with it a couple days ago.  It is posterior, worse with palpation and movement.  No headache or fever.  No pain to the anterior neck or pain with swallowing.  No difficulty breathing or stridor.  She is not sure what caused it.  She is not had any falls or other injury.  No weakness or numbness anywhere.  No pain in the rest of her spine.     Home Medications Prior to Admission medications   Medication Sig Start Date End Date Taking? Authorizing Provider  acetaminophen-codeine (TYLENOL #3) 300-30 MG tablet Take 1 tablet by mouth every 4 (four) hours as needed for moderate pain or severe pain. 04/06/23   Storm Frisk, MD  aspirin EC 81 MG tablet Take 1 tablet (81 mg total) by mouth daily. 01/27/22   Storm Frisk, MD  atorvastatin (LIPITOR) 40 MG tablet Take 1 tablet (40 mg total) by mouth daily at 6 PM. 12/14/22   Georgeanna Lea, MD  cyclobenzaprine (FLEXERIL) 5 MG tablet Take 1 tablet (5 mg total) by mouth at bedtime. 04/06/23   Storm Frisk, MD  escitalopram (LEXAPRO) 10 MG tablet Take 1 tablet (10 mg total) by mouth daily. 03/15/23   Storm Frisk, MD  ezetimibe (ZETIA) 10 MG tablet Take 1 tablet (10 mg total) by mouth daily. 02/07/23   Storm Frisk, MD  levothyroxine (EUTHYROX) 100 MCG tablet Take 1 tablet (100 mcg total) by mouth daily before breakfast. 02/03/23   Storm Frisk, MD  lidocaine (LIDODERM) 5 % Place 1 patch onto the skin daily. Remove & Discard patch within 12 hours or as directed by MD 04/06/23   Storm Frisk, MD  naproxen (NAPROSYN) 500 MG tablet Take 1 tablet (500 mg total) by mouth 2 (two) times daily with a meal. 08/30/23   Marcine Matar, MD  nicotine polacrilex (NICORETTE MINI) 4 MG lozenge Use two times a day to reduce tobacco use 04/06/23   Storm Frisk, MD      Allergies    Sulfa antibiotics    Review of Systems   Review of Systems Review of systems completed and notable as per HPI.  ROS otherwise negative.   Physical Exam Updated Vital Signs BP 138/85 (BP Location: Left Arm)   Pulse 78   Temp 98.3 F (36.8 C)   Resp 18   Ht 5\' 8"  (1.727 m)   Wt 92.5 kg   SpO2 97%   BMI 31.02 kg/m  Physical Exam Vitals and nursing note reviewed.  Constitutional:      General: She is not in acute distress.    Appearance: She is well-developed.  HENT:     Head: Normocephalic and atraumatic.     Mouth/Throat:     Mouth: Mucous membranes are moist.     Pharynx: Oropharynx is clear.  Eyes:     Extraocular Movements: Extraocular movements intact.     Conjunctiva/sclera: Conjunctivae normal.     Pupils: Pupils are equal, round, and  reactive to light.  Neck:     Comments: Mild tenderness along the posterior cervical spine mostly along the paraspinal muscles.  She has some pain with range of motion with slightly limits her range of motion.  She is no meningismus.  No other spinal tenderness. Cardiovascular:     Rate and Rhythm: Normal rate and regular rhythm.     Heart sounds: No murmur heard. Pulmonary:     Effort: Pulmonary effort is normal. No respiratory distress.     Breath sounds: Normal breath sounds.  Abdominal:     Palpations: Abdomen is soft.     Tenderness: There is no abdominal tenderness. There is no guarding or rebound.  Musculoskeletal:        General: No swelling.     Cervical back: Tenderness present. No rigidity.  Skin:    General: Skin is warm and dry.     Capillary Refill: Capillary refill takes less than 2 seconds.  Neurological:     General: No  focal deficit present.     Mental Status: She is alert and oriented to person, place, and time. Mental status is at baseline.     Cranial Nerves: No cranial nerve deficit.     Sensory: No sensory deficit.     Motor: No weakness.  Psychiatric:        Mood and Affect: Mood normal.     ED Results / Procedures / Treatments   Labs (all labs ordered are listed, but only abnormal results are displayed) Labs Reviewed  BASIC METABOLIC PANEL - Abnormal; Notable for the following components:      Result Value   Calcium 8.2 (*)    All other components within normal limits  I-STAT CHEM 8, ED - Abnormal; Notable for the following components:   BUN 21 (*)    Calcium, Ion 1.04 (*)    All other components within normal limits  I-STAT VENOUS BLOOD GAS, ED - Abnormal; Notable for the following components:   pO2, Ven 48 (*)    Calcium, Ion 1.07 (*)    All other components within normal limits    EKG None  Radiology CT ANGIO HEAD NECK W WO CM  Result Date: 09/25/2023 CLINICAL DATA:  Sudden onset headache EXAM: CT ANGIOGRAPHY HEAD AND NECK WITH AND WITHOUT CONTRAST TECHNIQUE: Multidetector CT imaging of the head and neck was performed using the standard protocol during bolus administration of intravenous contrast. Multiplanar CT image reconstructions and MIPs were obtained to evaluate the vascular anatomy. Carotid stenosis measurements (when applicable) are obtained utilizing NASCET criteria, using the distal internal carotid diameter as the denominator. RADIATION DOSE REDUCTION: This exam was performed according to the departmental dose-optimization program which includes automated exposure control, adjustment of the mA and/or kV according to patient size and/or use of iterative reconstruction technique. CONTRAST:  75mL OMNIPAQUE IOHEXOL 350 MG/ML SOLN COMPARISON:  04/15/2018 FINDINGS: CTA NECK FINDINGS SKELETON: No acute abnormality or high grade bony spinal canal stenosis. OTHER NECK: Normal pharynx,  larynx and major salivary glands. No cervical lymphadenopathy. Unremarkable thyroid gland. UPPER CHEST: No pneumothorax or pleural effusion. No nodules or masses. AORTIC ARCH: There is no calcific atherosclerosis of the aortic arch. Conventional 3 vessel aortic branching pattern. RIGHT CAROTID SYSTEM: Normal without aneurysm, dissection or stenosis. LEFT CAROTID SYSTEM: Normal without aneurysm, dissection or stenosis. VERTEBRAL ARTERIES: Codominant configuration. There is no dissection, occlusion or flow-limiting stenosis to the skull base (V1-V3 segments). CTA HEAD FINDINGS POSTERIOR CIRCULATION: --Vertebral arteries: Normal V4 segments. --Inferior  cerebellar arteries: Normal. --Basilar artery: Normal. --Superior cerebellar arteries: Normal. --Posterior cerebral arteries (PCA): Normal. ANTERIOR CIRCULATION: --Intracranial internal carotid arteries: Normal. --Anterior cerebral arteries (ACA): Normal. --Middle cerebral arteries (MCA): Normal. VENOUS SINUSES: As permitted by contrast timing, patent. ANATOMIC VARIANTS: None Review of the MIP images confirms the above findings. IMPRESSION: No emergent large vessel occlusion or high-grade stenosis of the intracranial arteries. Electronically Signed   By: Deatra Robinson M.D.   On: 09/25/2023 22:35   CT Cervical Spine Wo Contrast  Result Date: 09/25/2023 CLINICAL DATA:  Neck pain, acute, for 2 days that is worsened today EXAM: CT CERVICAL SPINE WITHOUT CONTRAST TECHNIQUE: Multidetector CT imaging of the cervical spine was performed without intravenous contrast. Multiplanar CT image reconstructions were also generated. RADIATION DOSE REDUCTION: This exam was performed according to the departmental dose-optimization program which includes automated exposure control, adjustment of the mA and/or kV according to patient size and/or use of iterative reconstruction technique. COMPARISON:  MRI cervical spine 05/19/2021 and cervical spine radiographs 04/29/2021 FINDINGS:  Alignment: No evidence of traumatic listhesis. Loss of lordosis is likely chronic/positional. Skull base and vertebrae: No evidence of acute fracture. 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 Soft tissues and spinal canal: No prevertebral fluid or swelling. No visible canal hematoma. Disc levels: Multilevel spondylosis, disc space height loss, and degenerative endplate changes greatest at C3-C4, C4-C5, and C5-C6 where it is moderate. Congenitally narrow spinal canal with superimposed moderate narrowing at C3-C4 secondary to posterior disc osteophyte complex. Neural foraminal narrowing is greatest on the left at C3-C4, on the left at C4-C5, and on the left at C5-C6 where it is moderate secondary to facet arthropathy and uncovertebral spurring. Upper chest: No acute abnormality. Other: Carotid calcification. IMPRESSION: 1. No acute fracture in the cervical spine. 2. Multilevel spondylosis as described and not significantly changed compared with MRI 05/19/2021 given differences in technique. Electronically Signed   By: Minerva Fester M.D.   On: 09/25/2023 21:18    Procedures Procedures    Medications Ordered in ED Medications  lidocaine (LIDODERM) 5 % 1 patch (1 patch Transdermal Patch Applied 09/25/23 2019)  ketorolac (TORADOL) 15 MG/ML injection 15 mg (15 mg Intramuscular Given 09/25/23 2019)  methocarbamol (ROBAXIN) tablet 1,000 mg (1,000 mg Oral Given 09/25/23 2019)  iohexol (OMNIPAQUE) 350 MG/ML injection 75 mL (75 mLs Intravenous Contrast Given 09/25/23 2228)    ED Course/ Medical Decision Making/ A&P Clinical Course as of 09/25/23 2255  Sun Sep 25, 2023  2135 I would back to reassess the patient.  She is feeling better.  However our initial conversation she denies any current or recent headaches.  However I reevaluated her she said a few days ago she had sudden onset headache that was severe.  Headache is since resolved and now she is having neck pain.  Given this, I think she  needs a CTA to rule out aneurysm due to risk for possible subarachnoid hemorrhage which could cause delayed neck pain. [JD]    Clinical Course User Index [JD] Laurence Spates, MD                                 Medical Decision Making Amount and/or Complexity of Data Reviewed Labs: ordered. Radiology: ordered.  Risk Prescription drug management.   Medical Decision Making:   SUNDAE BERTH is a 57 y.o. female who presented to the ED today with posterior neck pain.  Vital signs  reviewed.  On exam she is well-appearing.  She has posterior neck pain mostly over her paraspinals with some midline pain as well.  She has normal neurologic exam, no signs of spinal cord compression or neurologic compromise.  I am concerned for possible bony abnormality.  Obtain CT scan to evaluate.  Will treat symptomatically as well, could be muscle spasm.  She has no radicular symptoms.  She has no meningismus, fever or headache have low suspicion for CNS infection especially 3 days of symptoms.  No anterior neck pain or swelling.  She has not had any headache recently, low suspicion for subarachnoid hemorrhage or intracranial bleeding leading to pain especially given her pain is reproducible on exam with palpation.   Patient placed on continuous vitals and telemetry monitoring while in ED which was reviewed periodically.  Reviewed and confirmed nursing documentation for past medical history, family history, social history.  Reassessment and Plan:   I went back to reassess the patient.  She is feeling better.  However on our initial conversation she denies any current or recent headaches.  However I reevaluated her she said a few days ago she had sudden onset headache that was severe.  Headache has since resolved and now she is having neck pain.  Given this, I think she needs a CTA to rule out aneurysm due to risk for possible subarachnoid hemorrhage which could cause delayed neck pain.   CTA head and neck  obtained, no signs of intracranial hemorrhage, or aneurysm or other acute abnormality.  Given this, lower suspicion for subarachnoid hemorrhage.  On repeat reassessment she is feeling a lot better.  Continues to have no headache, neck pain is significantly improved and still is reproducible with palpation I think this is musculoskeletal.  Her lab work here is unremarkable.  I recommend she follow-up with her PCP.  I gave her strict return precautions for development of headache, fever, worsening neck pain or any other new concerning symptoms.   Patient's presentation is most consistent with acute complicated illness / injury requiring diagnostic workup.           Final Clinical Impression(s) / ED Diagnoses Final diagnoses:  Neck pain    Rx / DC Orders ED Discharge Orders     None         Laurence Spates, MD 09/25/23 2255

## 2023-09-25 NOTE — ED Triage Notes (Signed)
Pt reports 2 days of neck pain that has worsened today. Denies injury.

## 2023-09-25 NOTE — Discharge Instructions (Signed)
You were seen today for neck pain.  Your CT scan showed some degenerative changes but no fracture or other abnormality.  If you develop severe headache, severe neck pain, fever, weakness or numbness you should return to the ED.  You should follow-up closely with your doctor.

## 2023-09-26 ENCOUNTER — Ambulatory Visit: Payer: Commercial Managed Care - HMO | Admitting: Internal Medicine

## 2023-10-11 ENCOUNTER — Other Ambulatory Visit (HOSPITAL_COMMUNITY)
Admission: RE | Admit: 2023-10-11 | Discharge: 2023-10-11 | Disposition: A | Discharge status: Home / Self Care | Payer: Managed Care, Other (non HMO) | Source: Ambulatory Visit | Attending: Family Medicine | Admitting: Family Medicine

## 2023-10-11 ENCOUNTER — Ambulatory Visit: Payer: Managed Care, Other (non HMO) | Attending: Family Medicine | Admitting: Family Medicine

## 2023-10-11 VITALS — BP 136/84 | HR 75 | Ht 68.0 in | Wt 205.0 lb

## 2023-10-11 DIAGNOSIS — I10 Essential (primary) hypertension: Secondary | ICD-10-CM

## 2023-10-11 DIAGNOSIS — E890 Postprocedural hypothyroidism: Secondary | ICD-10-CM | POA: Diagnosis not present

## 2023-10-11 DIAGNOSIS — H539 Unspecified visual disturbance: Secondary | ICD-10-CM

## 2023-10-11 DIAGNOSIS — Z634 Disappearance and death of family member: Secondary | ICD-10-CM

## 2023-10-11 DIAGNOSIS — R519 Headache, unspecified: Secondary | ICD-10-CM | POA: Diagnosis not present

## 2023-10-11 DIAGNOSIS — F329 Major depressive disorder, single episode, unspecified: Secondary | ICD-10-CM

## 2023-10-11 DIAGNOSIS — N958 Other specified menopausal and perimenopausal disorders: Secondary | ICD-10-CM | POA: Diagnosis present

## 2023-10-11 DIAGNOSIS — R7303 Prediabetes: Secondary | ICD-10-CM

## 2023-10-11 DIAGNOSIS — E782 Mixed hyperlipidemia: Secondary | ICD-10-CM

## 2023-10-11 DIAGNOSIS — G4700 Insomnia, unspecified: Secondary | ICD-10-CM

## 2023-10-11 LAB — POCT URINALYSIS DIP (CLINITEK)
Bilirubin, UA: NEGATIVE
Glucose, UA: NEGATIVE mg/dL
Ketones, POC UA: NEGATIVE mg/dL — AB
Leukocytes, UA: NEGATIVE
Nitrite, UA: NEGATIVE
POC PROTEIN,UA: NEGATIVE
Spec Grav, UA: 1.02 (ref 1.010–1.025)
Urobilinogen, UA: 0.2 U/dL
pH, UA: 6 (ref 5.0–8.0)

## 2023-10-11 MED ORDER — CYCLOBENZAPRINE HCL 10 MG PO TABS: 10.0000 mg | ORAL_TABLET | Freq: Every day | ORAL | 1 refills | Status: DC

## 2023-10-11 MED ORDER — ESCITALOPRAM OXALATE 10 MG PO TABS: 10.0000 mg | ORAL_TABLET | Freq: Every day | ORAL | 1 refills | Status: DC

## 2023-10-11 MED ORDER — ESTRADIOL 0.1 MG/GM VA CREA: TOPICAL_CREAM | VAGINAL | 1 refills | Status: AC

## 2023-10-11 MED ORDER — NAPROXEN 500 MG PO TABS: 500.0000 mg | ORAL_TABLET | Freq: Two times a day (BID) | ORAL | 1 refills | Status: AC

## 2023-10-11 NOTE — Patient Instructions (Signed)
VISIT SUMMARY:  During today's visit, we discussed several ongoing health issues and made adjustments to your treatment plan. We addressed your vaginal itching, post-stroke weakness, headaches, hypothyroidism, hyperlipidemia, and prediabetes. Additionally, we reviewed your general health maintenance and made necessary referrals and orders for lab tests.  YOUR PLAN:  -VAGINAL ITCHING: Your vaginal itching may be due to genitourinary symptoms of menopause, which can cause dryness and recurrent infections. We will perform a vaginal swab to rule out any infection and have prescribed an estrogen cream to apply around the vulva and vagina twice weekly.  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 We will continue with your current management plan and follow up with vocational rehabilitation when available.  -HEADACHES: You have been experiencing severe, intermittent headaches at the back of your head and neck for the past month. We will increase your muscle relaxant to 10mg  at night to help with sleep and potentially reduce headaches, and have prescribed Naproxen to be taken twice daily for headache management.  -HYPOTHYROIDISM: Hypothyroidism is a condition where your thyroid gland does not produce enough thyroid hormone. We have ordered a thyroid test to assess your current levels and will adjust your Levothyroxine dose as necessary.  -HYPERLIPIDEMIA: Hyperlipidemia is a condition of having high cholesterol levels. We have ordered a lipid panel to assess your current cholesterol levels.  -PREDIABETES: Prediabetes is a condition where your blood sugar levels are higher than normal but not high enough to be classified as diabetes. We have ordered an HbA1c test to monitor for any progression to diabetes.  -GENERAL HEALTH MAINTENANCE: We have referred you to ophthalmology for an eye check and will  continue your current medications, including those for cholesterol, Flexeril, Lexapro, and Naproxen.  INSTRUCTIONS:  Please follow up with the lab tests we have ordered, including the thyroid test, lipid panel, and HbA1c test. Additionally, schedule an appointment with ophthalmology for your eye check. Continue taking your prescribed medications as directed. If you experience any new symptoms or have concerns, please contact our office.

## 2023-10-11 NOTE — Progress Notes (Signed)
Subjective:  Patient ID: Meagan Flores, female    DOB: [birth year]  Age: 57 y.o. MRN: [id number]  CC: Medical Management of Chronic Issues (Grief/Headaches/Vaginal itching)   HPI Meagan Flores is a 57 y.o. year old female patient of Dr. Delford Field with a history of hyperlipidemia, stroke, major depressive disorder, hypothyroidism, hypertension (diet-controlled), pulmonary hypertension, right shoulder adhesive capsulitis, nicotine dependence.  Interval History: Discussed the use of AI scribe software for clinical note transcription with the patient, who gave verbal consent to proceed.  She presents with ongoing intermittent vaginal itching for the past four years. She denies any associated discharge or urinary symptoms. She has been managing the itching with over-the-counter treatments.  The patient also reports a history of right-sided weakness following a stroke, which has been further complicated by three surgeries on the right shoulder. She has been unable to lift her right arm following these surgeries and has been referred to vocational rehabilitation. She has seen orthopedics and also seen physical medicine and rehab all to no avail.  Additionally, the patient has been experiencing severe headaches for the past month. The headaches are located at the back of the head and neck, and are intermittent in nature. She has been managing the headaches with over-the-counter goody powders. She has no photophobia, phonophobia. The patient also reports difficulty sleeping, which has been ongoing since the murder of her son seven months ago.  She endorses adherence with her antihypertensive and her statin.       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   Prediabetes    Preop cardiovascular exam 11/25/2020   Pulmonary hypertension, unspecified (HCC) 11/25/2020   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   Rotator cuff impingement syndrome of right shoulder 09/30/2020   Skin rash 02/17/2021   Stroke [facility])    Tendinopathy of right biceps tendon 09/30/2020   Thyroid disease    TIA (transient ischemic attack) 04/15/2018   Tobacco use 09/28/2016    Past Surgical History:  Procedure Laterality Date   BICEPT TENODESIS Right 12/16/2021   Procedure: RIGHT BICEPS TENODESIS;  Surgeon: Tarry Kos, MD;  Location: [facility];  Service: Orthopedics;  Laterality: Right;   PARTIAL HYSTERECTOMY     right arm sx     SHOULDER ARTHROSCOPY WITH BICEPSTENOTOMY Right 01/21/2021   Procedure: SHOULDER ARTHROSCOPY WITH BICEPSTENOTOMY;  Surgeon: Tarry Kos, MD;  Location: [facility];  Service: Orthopedics;  Laterality: Right;   SHOULDER ARTHROSCOPY WITH DISTAL CLAVICLE RESECTION Right 01/12/2017   Procedure: RIGHT SHOULDER ARTHROSCOPY WITH  SUBACROMIAL DECOMPRESSION, DISTAL CLAVICLE EXCISION, extensive debridement;  Surgeon: Tarry Kos, MD;  Location: [facility];  Service: Orthopedics;  Laterality: Right;   THYMECTOMY  2004   THYROIDECTOMY      Family History  Problem Relation Age of Onset   Stomach cancer Sister    Colon polyps Sister    Esophageal cancer Neg Hx    Rectal cancer Neg Hx  Colon cancer Neg Hx    Breast cancer Neg Hx     Social History   Socioeconomic History   Marital status: Married    Spouse name: Not on file   Number of children: Not on file   Years of education: Not on file   Highest education level:  Not on file  Occupational History   Not on file  Tobacco Use   Smoking status: Every Day    Current packs/day: 0.50    Average packs/day: 0.5 packs/day for 34.0 years (17.0 ttl pk-yrs)    Types: Cigarettes   Smokeless tobacco: Never  Vaping Use   Vaping status: Never Used  Substance and Sexual Activity   Alcohol use: Yes    Comment: occassional   Drug use: No   Sexual activity: Not on file  Other Topics Concern   Not on file  Social History Narrative   Not on file   Social Determinants of Health   Financial Resource Strain: Medium Risk (10/11/2023)   Overall Financial Resource Strain (CARDIA)    Difficulty of Paying Living Expenses: Somewhat hard  Food Insecurity: Food Insecurity Present (10/11/2023)   Hunger Vital Sign    Worried About Running Out of Food in the Last Year: Sometimes true    Ran Out of Food in the Last Year: Sometimes true  Transportation Needs: No Transportation Needs (10/11/2023)   PRAPARE - Administrator, Civil Service (Medical): No    Lack of Transportation (Non-Medical): No  Physical Activity: Inactive (10/11/2023)   Exercise Vital Sign    Days of Exercise per Week: 0 days    Minutes of Exercise per Session: 0 min  Stress: Stress Concern Present (10/11/2023)   [employer] of Occupational Health - Occupational Stress Questionnaire    Feeling of Stress : To some extent  Social Connections: Unknown (10/11/2023)   Social Connection and Isolation Panel [NHANES]    Frequency of Communication with Friends and Family: Three times a week    Frequency of Social Gatherings with Friends and Family: Never    Attends Religious Services: Not on Marketing executive or Organizations: No    Attends Engineer, structural: Not on file    Marital Status: Married    Allergies  Allergen Reactions   Sulfa Antibiotics Hives    Outpatient Medications Prior to Visit  Medication Sig Dispense Refill   acetaminophen-codeine  (TYLENOL #3) 300-30 MG tablet Take 1 tablet by mouth every 4 (four) hours as needed for moderate pain or severe pain. 60 tablet 1   aspirin EC 81 MG tablet Take 1 tablet (81 mg total) by mouth daily. 60 tablet 3   atorvastatin (LIPITOR) 40 MG tablet Take 1 tablet (40 mg total) by mouth daily at 6 PM. 90 tablet 3   ezetimibe (ZETIA) 10 MG tablet Take 1 tablet (10 mg total) by mouth daily. 90 tablet 3   levothyroxine (EUTHYROX) 100 MCG tablet Take 1 tablet (100 mcg total) by mouth daily before breakfast. 90 tablet 1   lidocaine (LIDODERM) 5 % Place 1 patch onto the skin daily. Remove & Discard patch within 12 hours or as directed by MD 30 patch 4   nicotine polacrilex (NICORETTE MINI) 4 MG lozenge Use two times a day to reduce tobacco use 100 tablet 4   cyclobenzaprine (FLEXERIL) 5 MG tablet Take 1 tablet (5 mg total) by mouth at bedtime. 30 tablet 1   escitalopram (LEXAPRO) 10 MG  tablet Take 1 tablet (10 mg total) by mouth daily. 30 tablet 1   naproxen (NAPROSYN) 500 MG tablet Take 1 tablet (500 mg total) by mouth 2 (two) times daily with a meal. 20 tablet 0   No facility-administered medications prior to visit.     ROS Review of Systems  Constitutional:  Negative for activity change and appetite change.  HENT:  Negative for sinus pressure and sore throat.   Respiratory:  Negative for chest tightness, shortness of breath and wheezing.   Cardiovascular:  Negative for chest pain and palpitations.  Gastrointestinal:  Negative for abdominal distention, abdominal pain and constipation.  Genitourinary: Negative.   Musculoskeletal:        See HPI  Neurological:  Positive for headaches.  Psychiatric/Behavioral:  Negative for behavioral problems and dysphoric mood.     Objective:  BP 136/84   Pulse 75   Ht 5\' 8"  (1.727 m)   Wt 205 lb (93 kg)   SpO2 99%   BMI 31.17 kg/m      10/11/2023    8:41 AM 09/25/2023   10:53 PM 09/25/2023    5:42 PM  BP/Weight  Systolic BP 136 130 138   Diastolic BP 84 80 85  Wt. (Lbs) 205    BMI 31.17 kg/m2        Physical Exam Constitutional:      Appearance: She is well-developed.  Cardiovascular:     Rate and Rhythm: Normal rate.     Heart sounds: Normal heart sounds. No murmur heard. Pulmonary:     Effort: Pulmonary effort is normal.     Breath sounds: Normal breath sounds. No wheezing or rales.  Chest:     Chest wall: No tenderness.  Abdominal:     General: Bowel sounds are normal. There is no distension.     Palpations: Abdomen is soft. There is no mass.     Tenderness: There is no abdominal tenderness.  Musculoskeletal:     Right shoulder: Tenderness present. Decreased range of motion.     Left shoulder: No tenderness. Normal range of motion.     Right hand: Decreased strength.     Left hand: Normal strength.     Right lower leg: No edema.     Left lower leg: No edema.  Neurological:     Mental Status: She is alert and oriented to person, place, and time.  Psychiatric:        Mood and Affect: Mood normal.        Latest Ref Rng & Units 09/25/2023   10:00 PM 09/25/2023    9:53 PM 09/25/2023    9:45 PM  CMP  Glucose 70 - 99 mg/dL 90   95   BUN 6 - 20 mg/dL 21   20   Creatinine 8.29 - 1.00 mg/dL 5.62   1.30   Sodium 865 - 145 mmol/L 140  141  136   Potassium 3.5 - 5.1 mmol/L 4.0  4.0  3.9   Chloride 98 - 111 mmol/L 104   101   CO2 22 - 32 mmol/L   24   Calcium 8.9 - 10.3 mg/dL   8.2     Lipid Panel     Component Value Date/Time   CHOL 237 (H) 02/03/2023 0957   TRIG 116 02/03/2023 0957   HDL 53 02/03/2023 0957   CHOLHDL 4.5 (H) 02/03/2023 0957   CHOLHDL 4.5 04/15/2018 0058   VLDL 28 04/15/2018 0058   LDLCALC 163 (H) 02/03/2023  0957    CBC    Component Value Date/Time   WBC 7.1 01/20/2023 0001   RBC 5.03 01/20/2023 0001   HGB 13.9 09/25/2023 2200   HGB 12.2 01/27/2022 0905   HCT 41.0 09/25/2023 2200   HCT 39.7 01/27/2022 0905   PLT 243 01/20/2023 0001   PLT 375 01/27/2022 0905   MCV 74.8  (L) 01/20/2023 0001   MCV 75 (L) 01/27/2022 0905   MCH 23.7 (L) 01/20/2023 0001   MCHC 31.6 01/20/2023 0001   RDW 15.8 (H) 01/20/2023 0001   RDW 14.7 01/27/2022 0905   LYMPHSABS 1.0 01/20/2023 0001   LYMPHSABS 2.7 01/27/2022 0905   MONOABS 0.6 01/20/2023 0001   EOSABS 0.1 01/20/2023 0001   EOSABS 0.0 01/27/2022 0905   BASOSABS 0.0 01/20/2023 0001   BASOSABS 0.0 01/27/2022 1610    Lab Results  Component Value Date   HGBA1C 5.8 (A) 10/21/2020    Lab Results  Component Value Date   TSH 12.900 (H) 02/03/2023    Assessment & Plan:      Genitourinary symptoms of menopause Intermittent for 4 years, no discharge. Discussed possibility of genitourinary symptoms of menopause due to decreased estrogen leading to dryness and recurrent infections. -Perform vaginal swab to rule out infection. -Prescribe estrogen cream to apply around vulva and vagina twice weekly.  Post-Stroke Weakness and right adhesive capsulitis Right-sided weakness following stroke, with additional right shoulder issues leading to difficulty lifting arm. Orthopedic surgeon has stated no further interventions can be made. -Continue current management and follow-up with vocational rehab when available.  Headaches Severe, intermittent headaches for the past month, located at the back of the head and neck. No current headache. Previous CT scan showed no blockage. -Increase muscle relaxant to 10mg  at night to aid sleep and potentially reduce headaches. -Prescribe Naproxen to be taken twice daily for headache management.  Hypothyroidism Last thyroid test in March was uncontrolled. Currently on Levothyroxine. -Order thyroid test to assess current levels and adjust Levothyroxine dose as necessary.  Hyperlipidemia High cholesterol noted in March. -Order lipid panel to assess current levels.  Prediabetes Last checked in 2021, A1c of 5.8 -Order HbA1c to monitor for progression to diabetes.  Vision  abnormalities Referred to ophthalmology  Insomnia Bereavement contributing Flexeril dose was increased which will help with insomnia  Depression Currently on Lexapro Bereavement contributing  General Health Maintenance -Referral to ophthalmology for eye check. -Continue current medications including cholesterol medication, Flexeril, Lexapro, and Naproxen.          Meds ordered this encounter  Medications   estradiol (ESTRACE VAGINAL) 0.1 MG/GM vaginal cream    Sig: Apply a pea size amount to vulva twice a week    Dispense:  42.5 g    Refill:  1   cyclobenzaprine (FLEXERIL) 10 MG tablet    Sig: Take 1 tablet (10 mg total) by mouth at bedtime.    Dispense:  90 tablet    Refill:  1    Dose increased   escitalopram (LEXAPRO) 10 MG tablet    Sig: Take 1 tablet (10 mg total) by mouth daily.    Dispense:  90 tablet    Refill:  1   naproxen (NAPROSYN) 500 MG tablet    Sig: Take 1 tablet (500 mg total) by mouth 2 (two) times daily with a meal.    Dispense:  60 tablet    Refill:  1    Follow-up: Return in about 3 months (around 01/11/2024) for To establish care  with any available PCP.    Visit required 49 minutes of patient care including median intraservice time, reviewing previous notes and test results, coordination of care, counseling the patient in addition to management of chronic medical conditions.Time also spent ordering medications, investigations and documenting in the chart.  All questions were answered to the patient's satisfaction    [name], MD, FAAFP. [facility] [place], [place] [phone]   10/11/2023, 1:06 PM

## 2023-10-12 ENCOUNTER — Other Ambulatory Visit: Payer: Self-pay | Admitting: Family Medicine

## 2023-10-12 DIAGNOSIS — Z8673 Personal history of transient ischemic attack (TIA), and cerebral infarction without residual deficits: Secondary | ICD-10-CM

## 2023-10-12 LAB — LP+NON-HDL CHOLESTEROL
Cholesterol, Total: 238 mg/dL — ABNORMAL HIGH (ref 100–199)
HDL: 52 mg/dL (ref >39)
LDL Chol Calc (NIH): 168 mg/dL — ABNORMAL HIGH (ref 0–99)
Total Non-HDL-Chol (LDL+VLDL): 186 mg/dL — ABNORMAL HIGH (ref 0–129)
Triglycerides: 103 mg/dL (ref 0–149)
VLDL Cholesterol Cal: 18 mg/dL (ref 5–40)

## 2023-10-12 LAB — CERVICOVAGINAL ANCILLARY ONLY
Bacterial Vaginitis (gardnerella): NEGATIVE
Candida Glabrata: POSITIVE — AB
Candida Vaginitis: NEGATIVE
Chlamydia: NEGATIVE
Comment: NEGATIVE
Comment: NEGATIVE
Comment: NEGATIVE
Comment: NEGATIVE
Comment: NEGATIVE
Comment: NORMAL
Neisseria Gonorrhea: NEGATIVE
Trichomonas: NEGATIVE

## 2023-10-12 LAB — T4, FREE: Free T4: 1.19 ng/dL (ref 0.82–1.77)

## 2023-10-12 LAB — T3: T3, Total: 75 ng/dL (ref 71–180)

## 2023-10-12 LAB — HEMOGLOBIN A1C
Est. average glucose Bld gHb Est-mCnc: 134 mg/dL
Hgb A1c MFr Bld: 6.3 % — ABNORMAL HIGH (ref 4.8–5.6)

## 2023-10-12 LAB — TSH: TSH: 15.3 u[IU]/mL — ABNORMAL HIGH (ref 0.450–4.500)

## 2023-10-12 MED ORDER — ATORVASTATIN CALCIUM 80 MG PO TABS: 80.0000 mg | ORAL_TABLET | Freq: Every day | ORAL | 1 refills | Status: DC

## 2023-10-12 MED ORDER — FLUCONAZOLE 150 MG PO TABS: 150.0000 mg | ORAL_TABLET | Freq: Once | ORAL | 0 refills | Status: AC

## 2023-10-12 MED ORDER — LEVOTHYROXINE SODIUM 112 MCG PO TABS: 112.0000 ug | ORAL_TABLET | Freq: Every day | ORAL | 1 refills | Status: DC

## 2023-10-14 ENCOUNTER — Telehealth: Payer: Self-pay | Admitting: Family Medicine

## 2023-10-14 ENCOUNTER — Ambulatory Visit: Payer: Self-pay | Admitting: *Deleted

## 2023-10-14 NOTE — Telephone Encounter (Signed)
Message from Turkey B sent at 10/14/2023  3:29 PM EST  Summary: instructions for med   Pharmacist called in about, estradiol (ESTRACE VAGINAL) 0.1mg . She states they need to know how much the pt needs to apply, such as 1/12 gram or gram. Etc.          Call History  Contact Date/Time Type Contact Phone/Fax User  10/14/2023 03:28 PM EST Phone (Incoming) [facility] - [place], [place] - [address] (Pharmacy) [phone] Glean Salen   Reason for Disposition  [1] Pharmacy calling with prescription question AND [2] triager unable to answer question  Answer Assessment - Initial Assessment Questions 1. NAME of MEDICINE: "What medicine(s) are you calling about?"     Estrace Vaginal 0.1 mg 2. QUESTION: "What is your question?" (e.g., double dose of medicine, side effect)     [facility] needs to know how much the pt. Needs to apply.  Such as 1/12 gram or gram, etc.    Pt also called in and left a message regarding the same thing.  3. PRESCRIBER: "Who prescribed the medicine?" Reason: if prescribed by specialist, call should be referred to that group.     Dr. Alvis Lemmings 4. SYMPTOMS: "Do you have any symptoms?" If Yes, ask: "What symptoms are you having?"  "How bad are the symptoms (e.g., mild, moderate, severe)     N/A 5. PREGNANCY:  "Is there any chance that you are pregnant?" "When was your last menstrual period?"     N/A  Protocols used: Medication Question Call-A-AH

## 2023-10-14 NOTE — Telephone Encounter (Signed)
Pt is calling in because [facility] on [address] [facility]) says they received the prescription for estradiol (ESTRACE VAGINAL) 0.1 MG/GM vaginal cream [[id number]] but not the instructions on how to use the medication. Pt is requesting someone call the pharmacy to clarify. Please advise.

## 2023-10-14 NOTE — Telephone Encounter (Signed)
  Chief Complaint: Sales executive. Needs clarification on the Estrace Vaginal 0.1 mg.   See phar. message Symptoms: N/A Frequency: N/A Pertinent Negatives: Patient denies N/A Disposition: [] ED /[] Urgent Care (no appt availability in office) / [] Appointment(In office/virtual)/ []  [place] [facility]/ [] Home Care/ [] Refused Recommended Disposition /[] [facility]/ [x]  Follow-up with PCP Additional Notes: Message sent to Dr. Alvis Lemmings

## 2023-10-14 NOTE — Telephone Encounter (Signed)
Call placed to [facility] clarification for estradiol (ESTRACE VAGINAL) 0.1 MG/GM vaginal cream apply 2 gram vulva twice a week .

## 2023-12-30 ENCOUNTER — Encounter: Payer: Self-pay | Admitting: Cardiology

## 2023-12-30 ENCOUNTER — Ambulatory Visit: Payer: Commercial Managed Care - HMO | Attending: Cardiology

## 2023-12-30 ENCOUNTER — Ambulatory Visit: Payer: Commercial Managed Care - HMO | Attending: Cardiology | Admitting: Cardiology

## 2023-12-30 VITALS — BP 112/74 | HR 65 | Ht 68.0 in | Wt 206.0 lb

## 2023-12-30 DIAGNOSIS — I10 Essential (primary) hypertension: Secondary | ICD-10-CM

## 2023-12-30 DIAGNOSIS — Z720 Tobacco use: Secondary | ICD-10-CM

## 2023-12-30 DIAGNOSIS — F4321 Adjustment disorder with depressed mood: Secondary | ICD-10-CM | POA: Diagnosis not present

## 2023-12-30 DIAGNOSIS — R002 Palpitations: Secondary | ICD-10-CM

## 2023-12-30 DIAGNOSIS — Z634 Disappearance and death of family member: Secondary | ICD-10-CM

## 2023-12-30 DIAGNOSIS — E782 Mixed hyperlipidemia: Secondary | ICD-10-CM

## 2023-12-30 NOTE — Patient Instructions (Addendum)
Medication Instructions:  Your physician recommends that you continue on your current medications as directed. Please refer to the Current Medication list given to you today.  *If you need a refill on your cardiac medications before your next appointment, please call your pharmacy*   Lab Work: None Ordered If you have labs (blood work) drawn today and your tests are completely normal, you will receive your results only by: [place] (if you have MyChart) OR A paper copy in the mail If you have any lab test that is abnormal or we need to change your treatment, we will call you to review the results.   Testing/Procedures:  WHY IS MY DOCTOR PRESCRIBING ZIO? The Zio system is proven and trusted by physicians to detect and diagnose irregular heart rhythms -- and has been prescribed to hundreds of thousands of patients.  The FDA has cleared the Zio system to monitor for many different kinds of irregular heart rhythms. In a study, physicians were able to reach a diagnosis 90% of the time with the Zio system1.  You can wear the Zio monitor -- a small, discreet, comfortable patch -- during your normal day-to-day activity, including while you sleep, shower, and exercise, while it records every single heartbeat for analysis.  1Barrett, P., et al. Comparison of 24 Hour Holter Monitoring Versus 14 Day Novel Adhesive Patch Electrocardiographic Monitoring. [place], 2014.  ZIO VS. HOLTER MONITORING The Zio monitor can be comfortably worn for up to 14 days. Holter monitors can be worn for 24 to 48 hours, limiting the time to record any irregular heart rhythms you may have. Zio is able to capture data for the 51% of patients who have their first symptom-triggered arrhythmia after 48 hours.1  LIVE WITHOUT RESTRICTIONS The Zio ambulatory cardiac monitor is a small, unobtrusive, and water-resistant patch--you might even forget you're wearing it. The Zio monitor records and stores  every beat of your heart, whether you're sleeping, working out, or showering.     Follow-Up: At [facility], you and your health needs are our priority.  As part of our continuing mission to provide you with exceptional heart care, we have created designated Provider Care Teams.  These Care Teams include your primary Cardiologist (physician) and Advanced Practice Providers (APPs -  Physician Assistants and Nurse Practitioners) who all work together to provide you with the care you need, when you need it.  We recommend signing up for the patient portal called "MyChart".  Sign up information is provided on this After Visit Summary.  MyChart is used to connect with patients for Virtual Visits (Telemedicine).  Patients are able to view lab/test results, encounter notes, upcoming appointments, etc.  Non-urgent messages can be sent to your provider as well.   To learn more about what you can do with MyChart, go to [email or web address].    Your next appointment:   12 month(s)  The format for your next appointment:   In Person  Provider:   Jenne Campus, MD    Other Instructions NA

## 2023-12-30 NOTE — Addendum Note (Signed)
Addended by: Baldo Ash D on: 12/30/2023 09:18 AM   Modules accepted: Orders

## 2023-12-30 NOTE — Progress Notes (Signed)
Cardiology Office Note:    Date:  12/30/2023   ID:  Meagan Flores, DOB Mar 25, 1966, MRN [id number]  PCP:  [name], MD  Cardiologist:  Gypsy Balsam, MD    Referring MD: [name], MD   Chief Complaint  Patient presents with   Irregular Heart Beat    History of Present Illness:    Meagan Flores is a 58 y.o. female past medical history significant for questionable pulmonary hypertension and apparently her pulmonary artery was enlarged after that we did echocardiogram twice and every single time pulmonary pressure was normal.  Additional problem include essential hypertension, dyslipidemia, noncompliance, smoking which is still ongoing.  10 months ago she lost her son he was shot she still grieving after that she does see some therapist however apparently her insurance will not pay anymore for it.  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   Prediabetes    Preop cardiovascular exam 11/25/2020   Pulmonary hypertension, unspecified (HCC) 11/25/2020   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   Rotator cuff impingement syndrome of right shoulder 09/30/2020   Skin rash 02/17/2021   Stroke [facility])    Tendinopathy of right biceps tendon 09/30/2020   Thyroid disease    TIA (transient ischemic attack) 04/15/2018   Tobacco use 09/28/2016    Past Surgical History:  Procedure Laterality Date   BICEPT TENODESIS Right  12/16/2021   Procedure: RIGHT BICEPS TENODESIS;  Surgeon: Tarry Kos, MD;  Location: [facility];  Service: Orthopedics;  Laterality: Right;   PARTIAL HYSTERECTOMY     right arm sx     SHOULDER ARTHROSCOPY WITH BICEPSTENOTOMY Right 01/21/2021   Procedure: SHOULDER ARTHROSCOPY WITH BICEPSTENOTOMY;  Surgeon: Tarry Kos, MD;  Location: [facility];  Service: Orthopedics;  Laterality: Right;   SHOULDER ARTHROSCOPY WITH DISTAL CLAVICLE RESECTION Right 01/12/2017   Procedure: RIGHT SHOULDER ARTHROSCOPY WITH  SUBACROMIAL DECOMPRESSION, DISTAL CLAVICLE EXCISION, extensive debridement;  Surgeon: Tarry Kos, MD;  Location: [facility];  Service: Orthopedics;  Laterality: Right;   THYMECTOMY  2004   THYROIDECTOMY      Current Medications: Current Meds  Medication Sig   acetaminophen-codeine (TYLENOL #3) 300-30 MG tablet Take 1 tablet by mouth every 4 (four) hours as needed for moderate pain or severe pain.   aspirin EC 81 MG tablet Take 1 tablet (81 mg total) by mouth daily.   atorvastatin (LIPITOR) 80 MG tablet Take 1 tablet (80 mg total) by mouth daily at 6 PM.   cyclobenzaprine (FLEXERIL) 10 MG tablet Take 1 tablet (10 mg total) by mouth at bedtime.   escitalopram (LEXAPRO) 10 MG tablet Take 1 tablet (10 mg total) by mouth daily.   estradiol (ESTRACE VAGINAL) 0.1 MG/GM vaginal cream Apply a pea size amount to vulva twice a week   ezetimibe (ZETIA) 10 MG tablet Take 1 tablet (10 mg total) by mouth daily.   levothyroxine (EUTHYROX) 112 MCG  tablet Take 1 tablet (112 mcg total) by mouth daily before breakfast.   lidocaine (LIDODERM) 5 % Place 1 patch onto the skin daily. Remove & Discard patch within 12 hours or as directed by MD   naproxen (NAPROSYN) 500 MG tablet Take 1 tablet (500 mg total) by mouth 2 (two) times daily with a meal.   nicotine polacrilex (NICORETTE MINI) 4 MG lozenge Use two times a day to reduce tobacco use (Patient taking differently:  Take 4 mg by mouth See admin instructions. Use two times a day to reduce tobacco use)     Allergies:   Sulfa antibiotics   Social History   Socioeconomic History   Marital status: Married    Spouse name: Not on file   Number of children: Not on file   Years of education: Not on file   Highest education level: Not on file  Occupational History   Not on file  Tobacco Use   Smoking status: Every Day    Current packs/day: 0.50    Average packs/day: 0.5 packs/day for 34.0 years (17.0 ttl pk-yrs)    Types: Cigarettes   Smokeless tobacco: Never  Vaping Use   Vaping status: Never Used  Substance and Sexual Activity   Alcohol use: Yes    Comment: occassional   Drug use: No   Sexual activity: Not on file  Other Topics Concern   Not on file  Social History Narrative   Not on file   Social Drivers of Health   Financial Resource Strain: Medium Risk (10/11/2023)   Overall Financial Resource Strain (CARDIA)    Difficulty of Paying Living Expenses: Somewhat hard  Food Insecurity: Food Insecurity Present (10/11/2023)   Hunger Vital Sign    Worried About Running Out of Food in the Last Year: Sometimes true    Ran Out of Food in the Last Year: Sometimes true  Transportation Needs: No Transportation Needs (10/11/2023)   PRAPARE - Administrator, Civil Service (Medical): No    Lack of Transportation (Non-Medical): No  Physical Activity: Inactive (10/11/2023)   Exercise Vital Sign    Days of Exercise per Week: 0 days    Minutes of Exercise per Session: 0 min  Stress: Stress Concern Present (10/11/2023)   [employer] of Occupational Health - Occupational Stress Questionnaire    Feeling of Stress : To some extent  Social Connections: Unknown (10/11/2023)   Social Connection and Isolation Panel [NHANES]    Frequency of Communication with Friends and Family: Three times a week    Frequency of Social Gatherings with Friends and Family: Never    Attends Religious  Services: Not on Marketing executive or Organizations: No    Attends Engineer, structural: Not on file    Marital Status: Married     Family History: The patient's family history includes Colon polyps in her sister; Stomach cancer in her sister. There is no history of Esophageal cancer, Rectal cancer, Colon cancer, or Breast cancer. ROS:   Please see the history of present illness.    All 14 point review of systems negative except as described per history of present illness  EKGs/Labs/Other Studies Reviewed:    EKG Interpretation Date/Time:  Friday December 30 2023 08:31:14 EST Ventricular Rate:  69 PR Interval:  172 QRS Duration:  102 QT Interval:  380 QTC Calculation: 407 R Axis:   -66  Text Interpretation: Normal sinus rhythm Pulmonary disease pattern Incomplete right bundle  branch block Left anterior fascicular block Nonspecific T wave abnormality When compared with ECG of 25-May-2022 22:17, No significant change was found Confirmed by Gypsy Balsam [phone]) on 12/30/2023 8:48:00 AM    Recent Labs: 01/20/2023: Platelets 243 09/25/2023: BUN 21; Creatinine, Ser 0.90; Hemoglobin 13.9; Potassium 4.0; Sodium 140 10/11/2023: TSH 15.300  Recent Lipid Panel    Component Value Date/Time   CHOL 238 (H) 10/11/2023 0934   TRIG 103 10/11/2023 0934   HDL 52 10/11/2023 0934   CHOLHDL 4.5 (H) 02/03/2023 0957   CHOLHDL 4.5 04/15/2018 0058   VLDL 28 04/15/2018 0058   LDLCALC 168 (H) 10/11/2023 0934    Physical Exam:    VS:  BP 112/74 (BP Location: Right Arm, Patient Position: Sitting)   Pulse 65   Ht 5\' 8"  (1.727 m)   Wt 206 lb (93.4 kg)   SpO2 94%   BMI 31.32 kg/m     Wt Readings from Last 3 Encounters:  12/30/23 206 lb (93.4 kg)  10/11/23 205 lb (93 kg)  09/25/23 204 lb (92.5 kg)     GEN:  Well nourished, well developed in no acute distress HEENT: Normal NECK: No JVD; No carotid bruits LYMPHATICS: No lymphadenopathy CARDIAC: RRR, no murmurs, no  rubs, no gallops RESPIRATORY:  Clear to auscultation without rales, wheezing or rhonchi  ABDOMEN: Soft, non-tender, non-distended MUSCULOSKELETAL:  No edema; No deformity  SKIN: Warm and dry LOWER EXTREMITIES: no swelling NEUROLOGIC:  Alert and oriented x 3 PSYCHIATRIC:  Normal affect   ASSESSMENT:    1. Essential hypertension   2. Mixed hyperlipidemia   3. Grief at loss of child   4. Tobacco use    PLAN:    In order of problems listed above:  Essential hypertension blood pressure well-controlled continue present management. Dyslipidemia I did review K PN data from November show me LDL 168 HDL 52 clearly unacceptable and she understand that she was given some extra medication that she said she takes She has fasting lipid profile checked will wait for results of it. Grief of loss of child.  Obviously very tragic situation.  Apparently she did see psychotherapist but insurance company stopped paying for it I asked her to invite to gait this is possible did just place that she was going to does not accept his insurance she must need to find some help.  I gave her information about better help online services that can help. Tobacco abuse she still smokes to start talking to her about potentially quitting but she does not ever hear about it. She described today palpitations she said almost every single day her heart will take off.  Will ask her to wear Zio patch for 2 weeks to see exactly what kind of arrhythmia with dealing with   Medication Adjustments/Labs and Tests Ordered: Current medicines are reviewed at length with the patient today.  Concerns regarding medicines are outlined above.  Orders Placed This Encounter  Procedures   EKG 12-Lead   Medication changes: No orders of the defined types were placed in this encounter.   Signed, Georgeanna Lea, MD, [facility] 12/30/2023 8:57 AM    [facility]

## 2024-01-06 ENCOUNTER — Telehealth: Payer: Self-pay | Admitting: Family Medicine

## 2024-01-06 NOTE — Telephone Encounter (Signed)
 Contacted pt to confirm appt

## 2024-01-11 ENCOUNTER — Ambulatory Visit: Payer: Commercial Managed Care - HMO | Attending: Family Medicine | Admitting: Family Medicine

## 2024-01-11 ENCOUNTER — Encounter: Payer: Self-pay | Admitting: Family Medicine

## 2024-01-11 VITALS — BP 124/83 | HR 67 | Ht 68.0 in | Wt 208.4 lb

## 2024-01-11 DIAGNOSIS — Z8673 Personal history of transient ischemic attack (TIA), and cerebral infarction without residual deficits: Secondary | ICD-10-CM

## 2024-01-11 DIAGNOSIS — E782 Mixed hyperlipidemia: Secondary | ICD-10-CM | POA: Diagnosis not present

## 2024-01-11 DIAGNOSIS — Z1211 Encounter for screening for malignant neoplasm of colon: Secondary | ICD-10-CM

## 2024-01-11 DIAGNOSIS — M7501 Adhesive capsulitis of right shoulder: Secondary | ICD-10-CM

## 2024-01-11 DIAGNOSIS — I10 Essential (primary) hypertension: Secondary | ICD-10-CM | POA: Diagnosis not present

## 2024-01-11 DIAGNOSIS — L2082 Flexural eczema: Secondary | ICD-10-CM | POA: Diagnosis not present

## 2024-01-11 DIAGNOSIS — E890 Postprocedural hypothyroidism: Secondary | ICD-10-CM | POA: Diagnosis not present

## 2024-01-11 DIAGNOSIS — I272 Pulmonary hypertension, unspecified: Secondary | ICD-10-CM

## 2024-01-11 DIAGNOSIS — R7303 Prediabetes: Secondary | ICD-10-CM

## 2024-01-11 DIAGNOSIS — G4700 Insomnia, unspecified: Secondary | ICD-10-CM

## 2024-01-11 MED ORDER — TRAZODONE HCL 50 MG PO TABS: 50.0000 mg | ORAL_TABLET | Freq: Every evening | ORAL | 3 refills | Status: DC | PRN

## 2024-01-11 MED ORDER — TRIAMCINOLONE ACETONIDE 0.1 % EX CREA: 1.0000 | TOPICAL_CREAM | Freq: Two times a day (BID) | CUTANEOUS | 2 refills | Status: AC

## 2024-01-11 NOTE — Progress Notes (Signed)
Subjective:  Patient ID: Meagan Flores, female    DOB: Aug 19, 1966  Age: 58 y.o. MRN: [id number]  CC: Medical Management of Chronic Issues (Right arm pain/Rash on arm/Not sleeping)   HPI Meagan Flores is a 58 y.o. year old female with a history of hyperlipidemia, stroke, major depressive disorder, hypothyroidism, hypertension (diet-controlled), pulmonary hypertension, right shoulder adhesive capsulitis, nicotine dependence, Prediabetes.   Interval History: Discussed the use of AI scribe software for clinical note transcription with the patient, who gave verbal consent to proceed.    The patient, with a history of right shoulder surgeries, presents with ongoing right shoulder pain and stiffness. She reports having received multiple injections for the pain, but with limited relief. She was referred to another specialist, but was unable to afford the consultation fee. She has registered for vocational rehabilitation, but is awaiting a response. She has previously attended physical therapy for the shoulder issue. She has been prescribed Flexeril and Tylenol 3 for pain management.  The patient also reports a persistent pruritic rash on both arms. She has been using over-the-counter hydrocortisone cream, but with limited success. Additionally, the patient reports difficulty sleeping.  Denies taking daytime naps or caffeine intake.  Last set of labs had revealed elevation in her cholesterol levels, for which her atorvastatin dosage was increased. She has prediabetes and hypothyroidism, and is currently managing these conditions with medications. She also reports a recent yeast infection, for which she was prescribed medication with improvement.  I had prescribed estrogen cream for vulvovaginal symptoms of menopause which she was unable to obtain due to cost.       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   Prediabetes    Preop cardiovascular exam 11/25/2020   Pulmonary hypertension, unspecified (HCC) 11/25/2020   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   Rotator cuff impingement syndrome of right shoulder 09/30/2020   Skin rash 02/17/2021   Stroke [facility])    Tendinopathy of right biceps tendon 09/30/2020   Thyroid disease    TIA (transient ischemic attack) 04/15/2018   Tobacco use 09/28/2016    Past Surgical History:  Procedure Laterality Date   BICEPT TENODESIS Right 12/16/2021   Procedure: RIGHT BICEPS TENODESIS;  Surgeon: Tarry Kos, MD;  Location: [facility];  Service: Orthopedics;  Laterality: Right;   PARTIAL HYSTERECTOMY     right arm sx     SHOULDER ARTHROSCOPY WITH BICEPSTENOTOMY Right 01/21/2021   Procedure: SHOULDER ARTHROSCOPY WITH BICEPSTENOTOMY;  Surgeon: Tarry Kos, MD;  Location: [facility];  Service: Orthopedics;  Laterality: Right;   SHOULDER ARTHROSCOPY WITH DISTAL CLAVICLE RESECTION Right 01/12/2017   Procedure: RIGHT SHOULDER ARTHROSCOPY WITH  SUBACROMIAL DECOMPRESSION, DISTAL CLAVICLE EXCISION, extensive debridement;  Surgeon: Tarry Kos, MD;  Location: [facility];  Service: Orthopedics;  Laterality: Right;   THYMECTOMY  2004   THYROIDECTOMY      Family History  Problem Relation Age of Onset   Stomach cancer Sister  Colon polyps Sister    Esophageal cancer Neg Hx    Rectal cancer Neg Hx    Colon cancer Neg Hx    Breast cancer Neg Hx     Social History    Socioeconomic History   Marital status: Married    Spouse name: Not on file   Number of children: Not on file   Years of education: Not on file   Highest education level: Not on file  Occupational History   Not on file  Tobacco Use   Smoking status: Every Day    Current packs/day: 0.50    Average packs/day: 0.5 packs/day for 34.0 years (17.0 ttl pk-yrs)    Types: Cigarettes   Smokeless tobacco: Never  Vaping Use   Vaping status: Never Used  Substance and Sexual Activity   Alcohol use: Yes    Comment: occassional   Drug use: No   Sexual activity: Not on file  Other Topics Concern   Not on file  Social History Narrative   Not on file   Social Drivers of Health   Financial Resource Strain: Medium Risk (10/11/2023)   Overall Financial Resource Strain (CARDIA)    Difficulty of Paying Living Expenses: Somewhat hard  Food Insecurity: Food Insecurity Present (10/11/2023)   Hunger Vital Sign    Worried About Running Out of Food in the Last Year: Sometimes true    Ran Out of Food in the Last Year: Sometimes true  Transportation Needs: No Transportation Needs (10/11/2023)   PRAPARE - Administrator, Civil Service (Medical): No    Lack of Transportation (Non-Medical): No  Physical Activity: Inactive (10/11/2023)   Exercise Vital Sign    Days of Exercise per Week: 0 days    Minutes of Exercise per Session: 0 min  Stress: Stress Concern Present (10/11/2023)   [employer] of Occupational Health - Occupational Stress Questionnaire    Feeling of Stress : To some extent  Social Connections: Unknown (10/11/2023)   Social Connection and Isolation Panel [NHANES]    Frequency of Communication with Friends and Family: Three times a week    Frequency of Social Gatherings with Friends and Family: Never    Attends Religious Services: Not on Marketing executive or Organizations: No    Attends Engineer, structural: Not on file    Marital Status:  Married    Allergies  Allergen Reactions   Sulfa Antibiotics Hives    Outpatient Medications Prior to Visit  Medication Sig Dispense Refill   acetaminophen-codeine (TYLENOL #3) 300-30 MG tablet Take 1 tablet by mouth every 4 (four) hours as needed for moderate pain or severe pain. 60 tablet 1   aspirin EC 81 MG tablet Take 1 tablet (81 mg total) by mouth daily. 60 tablet 3   atorvastatin (LIPITOR) 80 MG tablet Take 1 tablet (80 mg total) by mouth daily at 6 PM. 90 tablet 1   cyclobenzaprine (FLEXERIL) 10 MG tablet Take 1 tablet (10 mg total) by mouth at bedtime. 90 tablet 1   estradiol (ESTRACE VAGINAL) 0.1 MG/GM vaginal cream Apply a pea size amount to vulva twice a week 42.5 g 1   ezetimibe (ZETIA) 10 MG tablet Take 1 tablet (10 mg total) by mouth daily. 90 tablet 3   levothyroxine (EUTHYROX) 112 MCG tablet Take 1 tablet (112 mcg total) by mouth daily before breakfast. 90 tablet 1   lidocaine (LIDODERM) 5 % Place 1 patch onto the skin daily.  Remove & Discard patch within 12 hours or as directed by MD 30 patch 4   escitalopram (LEXAPRO) 10 MG tablet Take 1 tablet (10 mg total) by mouth daily. (Patient not taking: Reported on 01/11/2024) 90 tablet 1   naproxen (NAPROSYN) 500 MG tablet Take 1 tablet (500 mg total) by mouth 2 (two) times daily with a meal. (Patient not taking: Reported on 01/11/2024) 60 tablet 1   nicotine polacrilex (NICORETTE MINI) 4 MG lozenge Use two times a day to reduce tobacco use (Patient not taking: Reported on 01/11/2024) 100 tablet 4   No facility-administered medications prior to visit.     ROS Review of Systems  Constitutional:  Negative for activity change and appetite change.  HENT:  Negative for sinus pressure and sore throat.   Respiratory:  Negative for chest tightness, shortness of breath and wheezing.   Cardiovascular:  Negative for chest pain and palpitations.  Gastrointestinal:  Negative for abdominal distention, abdominal pain and constipation.   Genitourinary: Negative.   Musculoskeletal:        See HPI  Skin:  Positive for rash.  Psychiatric/Behavioral:  Positive for sleep disturbance. Negative for behavioral problems and dysphoric mood.     Objective:  BP (!) 150/86   Pulse 67   Ht 5\' 8"  (1.727 m)   Wt 208 lb 6.4 oz (94.5 kg)   SpO2 99%   BMI 31.69 kg/m      01/11/2024    9:50 AM 12/30/2023    8:34 AM 10/11/2023    8:41 AM  BP/Weight  Systolic BP 150 112 136  Diastolic BP 86 74 84  Wt. (Lbs) 208.4 206 205  BMI 31.69 kg/m2 31.32 kg/m2 31.17 kg/m2      Physical Exam Constitutional:      Appearance: She is well-developed.  Cardiovascular:     Rate and Rhythm: Normal rate.     Heart sounds: Normal heart sounds. No murmur heard. Pulmonary:     Effort: Pulmonary effort is normal.     Breath sounds: Normal breath sounds. No wheezing or rales.  Chest:     Chest wall: No tenderness.  Abdominal:     General: Bowel sounds are normal. There is no distension.     Palpations: Abdomen is soft. There is no mass.     Tenderness: There is no abdominal tenderness.  Musculoskeletal:        General: Normal range of motion.     Right lower leg: No edema.     Left lower leg: No edema.     Comments: Decreased range of motion in right shoulder Decreased handgrip and right hand. Left upper extremity is normal  Skin:    Comments: Hyperpigmented scaly rash on lateral aspect of right elbow and lateral aspect of left wrist  Neurological:     Mental Status: She is alert and oriented to person, place, and time.  Psychiatric:        Mood and Affect: Mood normal.        Latest Ref Rng & Units 09/25/2023   10:00 PM 09/25/2023    9:53 PM 09/25/2023    9:45 PM  CMP  Glucose 70 - 99 mg/dL 90   95   BUN 6 - 20 mg/dL 21   20   Creatinine 8.11 - 1.00 mg/dL 9.14   7.82   Sodium 956 - 145 mmol/L 140  141  136   Potassium 3.5 - 5.1 mmol/L 4.0  4.0  3.9   Chloride  98 - 111 mmol/L 104   101   CO2 22 - 32 mmol/L   24   Calcium  8.9 - 10.3 mg/dL   8.2     Lipid Panel     Component Value Date/Time   CHOL 238 (H) 10/11/2023 0934   TRIG 103 10/11/2023 0934   HDL 52 10/11/2023 0934   CHOLHDL 4.5 (H) 02/03/2023 0957   CHOLHDL 4.5 04/15/2018 0058   VLDL 28 04/15/2018 0058   LDLCALC 168 (H) 10/11/2023 0934    CBC    Component Value Date/Time   WBC 7.1 01/20/2023 0001   RBC 5.03 01/20/2023 0001   HGB 13.9 09/25/2023 2200   HGB 12.2 01/27/2022 0905   HCT 41.0 09/25/2023 2200   HCT 39.7 01/27/2022 0905   PLT 243 01/20/2023 0001   PLT 375 01/27/2022 0905   MCV 74.8 (L) 01/20/2023 0001   MCV 75 (L) 01/27/2022 0905   MCH 23.7 (L) 01/20/2023 0001   MCHC 31.6 01/20/2023 0001   RDW 15.8 (H) 01/20/2023 0001   RDW 14.7 01/27/2022 0905   LYMPHSABS 1.0 01/20/2023 0001   LYMPHSABS 2.7 01/27/2022 0905   MONOABS 0.6 01/20/2023 0001   EOSABS 0.1 01/20/2023 0001   EOSABS 0.0 01/27/2022 0905   BASOSABS 0.0 01/20/2023 0001   BASOSABS 0.0 01/27/2022 1610    Lab Results  Component Value Date   HGBA1C 6.3 (H) 10/11/2023    Lab Results  Component Value Date   TSH 15.300 (H) 10/11/2023    Assessment & Plan:      Adhesive capsulitis/right Shoulder Pain and Stiffness Status Post three surgeries with persistent pain and stiffness. Patient has had multiple injections and has seen multiple specialists. -Refer to physical therapy for further management and exercises to prevent further stiffness.  Eczema Noted on right elbow and left wrist. Current treatment with over-the-counter hydrocortisone not effective. -Prescribe stronger topical steroid for eczema -triamcinolone  Hyperlipidemia Last cholesterol level was high, and dose of atorvastatin was increased. -Check lipid panel to assess response to increased atorvastatin dose.  Prediabetes Last A1c was 6.0, down from 6.3. -Continue lifestyle modifications including diet and exercise to prevent progression to diabetes.  Hypothyroidism Last thyroid level was  abnormal, and dose was adjusted. -Check thyroid levels to assess response to dose adjustment.  Insomnia Patient reports difficulty sleeping. -Prescribe Trazodone to aid with sleep and discuss sleep hygiene measures including limiting caffeine, avoiding naps, and incorporating exercise.  Colon Cancer Screening Patient is due for colonoscopy. -Make referral for colonoscopy.  Hypertension Blood pressure was elevated during visit, but patient reported rushing and smoking prior to appointment. -Recheck blood pressure at end of visit-revealed improved blood pressure -Counseled on blood pressure goal of less than 130/80, low-sodium, DASH diet, medication compliance, 150 minutes of moderate intensity exercise per week. Discussed medication compliance, adverse effects.   Pulmonary hypertension -Managed by cardiology   History of TIA -Secondary prevention including statin use, aspirin, risk factor modification           Meds ordered this encounter  Medications   traZODone (DESYREL) 50 MG tablet    Sig: Take 1 tablet (50 mg total) by mouth at bedtime as needed for sleep.    Dispense:  30 tablet    Refill:  3   triamcinolone cream (KENALOG) 0.1 %    Sig: Apply 1 Application topically 2 (two) times daily.    Dispense:  45 g    Refill:  2    Follow-up: Return in about  6 months (around 07/10/2024) for Chronic medical conditions.       [name], MD, FAAFP. [facility] [place], [place] [phone]   01/11/2024, 10:10 AM

## 2024-01-11 NOTE — Patient Instructions (Signed)
VISIT SUMMARY:  During today's visit, we discussed your ongoing right shoulder pain and stiffness, persistent rash on both arms, difficulty sleeping, increased cholesterol levels, prediabetes, hypothyroidism, and recent yeast infection. We reviewed your current treatments and made adjustments where necessary to better manage your conditions.  YOUR PLAN:  -RIGHT SHOULDER PAIN AND STIFFNESS: You have ongoing pain and stiffness in your right shoulder despite multiple surgeries and injections. We will refer you to physical therapy to help manage the pain and prevent further stiffness with specific exercises.  -ECZEMA: Eczema is a condition that causes your skin to become itchy, red, and inflamed. Since the over-the-counter hydrocortisone cream has not been effective, we will prescribe a stronger topical steroid to help manage the rash on your arms.  -HYPERLIPIDEMIA: Hyperlipidemia means you have high cholesterol levels. We increased your atorvastatin dose recently and will check your lipid panel to see how well the new dose is working.  -PREDIABETES: Prediabetes means your blood sugar levels are higher than normal but not high enough to be classified as diabetes. Your last A1c level showed improvement. Continue with your diet and exercise routine to prevent progression to diabetes.  -HYPOTHYROIDISM: Hypothyroidism is when your thyroid gland doesn't produce enough thyroid hormone. We adjusted your medication dose recently and will check your thyroid levels to see if the new dose is effective.  -INSOMNIA: Insomnia is difficulty falling or staying asleep. We will prescribe Trazodone to help you sleep and discuss sleep hygiene measures like limiting caffeine, avoiding naps, and incorporating exercise.  -COLON CANCER SCREENING: You are due for a colonoscopy, which is a screening test for colon cancer. We will make a referral for you to get this done.  -HYPERTENSION: Hypertension means high blood pressure.  Your blood pressure was elevated today, but you mentioned rushing and smoking before the appointment. We will recheck your blood pressure at the end of the visit.  INSTRUCTIONS:  Please follow up with the referrals for physical therapy and colonoscopy. We will also need to check your lipid panel and thyroid levels soon. Continue with your lifestyle modifications for prediabetes and follow the sleep hygiene measures we discussed. If you have any questions or concerns, please do not hesitate to contact our office.

## 2024-01-12 ENCOUNTER — Other Ambulatory Visit: Payer: Self-pay | Admitting: Family Medicine

## 2024-01-12 ENCOUNTER — Telehealth: Payer: Self-pay | Admitting: *Deleted

## 2024-01-12 LAB — CMP14+EGFR
ALT: 13 [IU]/L (ref 0–32)
AST: 19 [IU]/L (ref 0–40)
Albumin: 4.2 g/dL (ref 3.8–4.9)
Alkaline Phosphatase: 100 [IU]/L (ref 44–121)
BUN/Creatinine Ratio: 14 (ref 9–23)
BUN: 12 mg/dL (ref 6–24)
Bilirubin Total: 0.2 mg/dL (ref 0.0–1.2)
CO2: 26 mmol/L (ref 20–29)
Calcium: 8.7 mg/dL (ref 8.7–10.2)
Chloride: 100 mmol/L (ref 96–106)
Creatinine, Ser: 0.83 mg/dL (ref 0.57–1.00)
Globulin, Total: 2.9 g/dL (ref 1.5–4.5)
Glucose: 94 mg/dL (ref 70–99)
Potassium: 4.4 mmol/L (ref 3.5–5.2)
Sodium: 144 mmol/L (ref 134–144)
Total Protein: 7.1 g/dL (ref 6.0–8.5)
eGFR: 82 mL/min/{1.73_m2} (ref >59)

## 2024-01-12 LAB — LP+NON-HDL CHOLESTEROL
Cholesterol, Total: 219 mg/dL — ABNORMAL HIGH (ref 100–199)
HDL: 56 mg/dL (ref >39)
LDL Chol Calc (NIH): 143 mg/dL — ABNORMAL HIGH (ref 0–99)
Total Non-HDL-Chol (LDL+VLDL): 163 mg/dL — ABNORMAL HIGH (ref 0–129)
Triglycerides: 114 mg/dL (ref 0–149)
VLDL Cholesterol Cal: 20 mg/dL (ref 5–40)

## 2024-01-12 MED ORDER — ROSUVASTATIN CALCIUM 20 MG PO TABS: 20.0000 mg | ORAL_TABLET | Freq: Every day | ORAL | 1 refills | Status: DC

## 2024-01-12 NOTE — Telephone Encounter (Signed)
Copied from CRM [id number]. Topic: Clinical - Prescription Issue >> Jan 12, 2024 10:23 AM Payton Doughty wrote: Reason for CRM: Areta Haber w/ [facility] calling to ask if OK to pt's   levothyroxine (EUTHYROX) 112 MCG tablet To a different manufacturer.  They are not able to reorder the pt's current.

## 2024-01-13 NOTE — Telephone Encounter (Signed)
Copied from CRM [id number]. Topic: Clinical - Prescription Issue >> Jan 13, 2024 10:48 AM Geroge Baseman wrote: Reason for CRM: [facility] calling to get an approval for levothyroxine they have a new manufacturer [employer], just needs approval in some way that this is okay. Can respond to fax that was sent yesterday or call [phone]. Anyone can help, feel free to leave a message.

## 2024-01-13 NOTE — Telephone Encounter (Signed)
It is okay to change manufacturer.

## 2024-01-13 NOTE — Telephone Encounter (Signed)
Left message on [facility] voicemail, that it is okay to change manufacturer.

## 2024-01-26 NOTE — Therapy (Signed)
 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   Prediabetes    Preop cardiovascular exam 11/25/2020   Pulmonary hypertension, unspecified (HCC) 11/25/2020   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   Rotator cuff impingement syndrome of right shoulder 09/30/2020   Skin rash 02/17/2021   Stroke [facility])    Tendinopathy of right biceps tendon 09/30/2020   Thyroid disease    TIA (transient ischemic attack) 04/15/2018   Tobacco use 09/28/2016   Past Surgical History:  Procedure Laterality Date   BICEPT TENODESIS Right 12/16/2021   Procedure: RIGHT BICEPS TENODESIS;  Surgeon: Tarry Kos, MD;  Location: [facility];  Service: Orthopedics;  Laterality: Right;   PARTIAL HYSTERECTOMY     right arm sx     SHOULDER ARTHROSCOPY WITH BICEPSTENOTOMY Right 01/21/2021   Procedure: SHOULDER ARTHROSCOPY WITH BICEPSTENOTOMY;  Surgeon: Tarry Kos, MD;  Location: [facility];  Service: Orthopedics;  Laterality: Right;   SHOULDER ARTHROSCOPY WITH DISTAL CLAVICLE RESECTION  Right 01/12/2017   Procedure: RIGHT SHOULDER ARTHROSCOPY WITH  SUBACROMIAL DECOMPRESSION, DISTAL CLAVICLE EXCISION, extensive debridement;  Surgeon: Tarry Kos, MD;  Location: 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   Grief at loss of child 03/23/2023   Chronic right shoulder pain 07/01/2022   BMI 33.0-33.9,adult 05/31/2022   Biceps tendonosis of right shoulder 02/10/2022   S/P arthroscopy of right shoulder 02/10/2022   Eczema 02/17/2021   Pulmonary hypertension, unspecified (HCC) 11/25/2020   Prediabetes    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) tear of right shoulder 09/30/2020   Rotator cuff impingement syndrome of right shoulder 09/30/2020   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  Rationale for Evaluation and Treatment: Rehabilitation  ONSET DATE: ~5 years  SUBJECTIVE:                                                                                                                                                                                      SUBJECTIVE STATEMENT: I been out of work for 3 years. I had 2 more surgeries since the last time I came here. My arm hurts me bad. [name]  told me I had frozen shoulder in January. I put heat and ice on it and try to move it around.   Hand dominance: Right  PERTINENT HISTORY: TIA 2019 and 2021, HTN, hx of stroke, SLAP lesion 2021, ACJ arthrosis  R bicep tenodesis 2023 Shoulder arthroscopy 2022  58 yo female with ~5 yr hx of Right shoulder pain , underwent Right distal clavicular resection per Dr [name] in 2018, RIght biceps tenotomy and labral repair in 2022, Biceps tenodesis in 2023.  Pt had PT after each surgery.  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 Yes: NPRS scale: 7/10 Pain location: stays in R shoulder  Pain description: sharp pain Aggravating factors: lifting it up, using it Relieving factors: nothing- I try ice,heat,and pain pills but it does not help   PRECAUTIONS: None  RED FLAGS: None   WEIGHT BEARING RESTRICTIONS: No  FALLS:  Has patient fallen in last 6 months? Yes. Number of falls 2  LIVING ENVIRONMENT: Lives with: lives with their spouse Lives in: House/apartment Stairs: No  OCCUPATION: Not working   PLOF: Independent and Independent with basic ADLs  PATIENT GOALS:I want to be able to use my arm   NEXT MD VISIT:   OBJECTIVE:  Note: Objective measures were completed at Evaluation unless otherwise noted.   COGNITION: Overall cognitive status: Within functional limits for tasks assessed     SENSATION: WFL  POSTURE: Rounded shoulders  UPPER EXTREMITY ROM:   Active ROM Right eval Left eval  Shoulder flexion 95 w/pain   Shoulder extension    Shoulder abduction 55 w/pain   Shoulder adduction    Shoulder internal rotation Unable to measure d/t pain   Shoulder external rotation Unable to measure d/t pain   Elbow flexion    Elbow extension    Wrist flexion    Wrist extension    Wrist ulnar deviation    Wrist radial  deviation    Wrist pronation    Wrist supination    (Blank rows = not tested)  UPPER EXTREMITY MMT:  MMT Right  eval Left eval  Shoulder flexion 2-   Shoulder extension    Shoulder abduction 2-   Shoulder adduction    Shoulder internal rotation 2+   Shoulder external rotation 2+   Middle trapezius    Lower trapezius    Elbow flexion    Elbow extension    Wrist flexion    Wrist extension    Wrist ulnar deviation    Wrist radial deviation    Wrist pronation    Wrist supination    Grip strength (lbs)    (Blank rows = not tested)  SHOULDER SPECIAL TESTS: Impingement tests: Neer impingement test: positive , Hawkins/Kennedy impingement test: positive , and Painful arc test: positive  Rotator cuff assessment: Drop arm test: positive  and Empty can test: positive  Biceps assessment: Speed's test: positive   JOINT MOBILITY TESTING:  Very minimal movements, patient is super guarded and pain prevents movement   PALPATION:  TTP around R shoulder                                                                                                                              TREATMENT DATE: EVAL 01/30/24   PATIENT EDUCATION: Education details: POC and HEP, impingement syndrome and adhesive capsulitis anatomy  Person educated: Patient Education method: Explanation Education comprehension: verbalized understanding  HOME EXERCISE PROGRAM: Access Code: [email or web address] URL: [email or web address] Date: 01/30/2024 Prepared by: Cassie Freer  Exercises - Seated Shoulder Flexion Towel Slide at Table Top  - 1 x daily - 7 x weekly - 3 sets - 10 reps - Seated Shoulder Abduction Towel Slide at Table Top  - 1 x daily - 7 x weekly - 3 sets - 10 reps - Seated Elbow Extension and Shoulder External Rotation AAROM at Table with Towel  - 1 x daily - 7 x weekly - 3 sets - 10 reps - Seated Elbow Flexion Shoulder Internal Rotation AAROM at Table with Towel  - 1 x daily - 7 x weekly - 3 sets - 10  reps - Standing Isometric Shoulder Flexion with Doorway - Arm Bent  - 1 x daily - 7 x weekly - 3 sets - 10 reps - Standing Isometric Shoulder Internal Rotation with Towel Roll at Doorway  - 1 x daily - 7 x weekly - 3 sets - 10 reps - Standing Isometric Shoulder External Rotation with Doorway and Towel Roll  - 1 x daily - 7 x weekly - 3 sets - 10 reps - Standing Isometric Shoulder Abduction with Doorway - Arm Bent  - 1 x daily - 7 x weekly - 3 sets - 10 reps - Standing Isometric Shoulder Extension with Doorway - Arm Bent  - 1 x daily - 7 x weekly - 3 sets - 10 reps  ASSESSMENT:  CLINICAL IMPRESSION: Patient is a 58 y.o. female who was seen today for physical therapy evaluation and treatment for R chronic shoulder pain. She has had ongoing pain for  years and multiple surgeries. Patient presents with decreased ROM and strength with all movements. She has been unable to work for 3 years now due to the shoulder pain. Her pain increases with movements that require her to lift her arm up at all. Patient will benefit from skilled PT to address her R shoulder pain to be able to complete her ADLS without difficulty.   OBJECTIVE IMPAIRMENTS: difficulty walking, decreased ROM, decreased strength, impaired UE functional use, improper body mechanics, and pain.   ACTIVITY LIMITATIONS: carrying, lifting, bathing, dressing, reach over head, and hygiene/grooming  PARTICIPATION LIMITATIONS: shopping, occupation, yard work, and church  PERSONAL FACTORS: Age, Behavior pattern, Past/current experiences, and Time since onset of injury/illness/exacerbation are also affecting patient's functional outcome.   REHAB POTENTIAL: Fair    CLINICAL DECISION MAKING: Evolving/moderate complexity  EVALUATION COMPLEXITY: Low  GOALS: Goals reviewed with patient? Yes  SHORT TERM GOALS: Target date: 03/12/24  Patient will be independent with initial HEP.  Baseline: given 01/30/24 Goal status: INITIAL   LONG TERM GOALS:  Target date: 04/23/24  Patient will be independent with advanced/ongoing HEP to improve outcomes and carryover.  Baseline:  Goal status: INITIAL  2.  Patient will report 75% improvement in R shoulder pain to improve QOL. (3/10 pain or better) Baseline: 7/10 Goal status: INITIAL  3.  Patient to improve R shoulder AROM to [facility] without pain provocation to allow for increased ease of ADLs.  Baseline: see chart Goal status: INITIAL  4.  Patient will demonstrate improved functional UE strength as demonstrated by 4/5. Baseline: 2-flex/abd, 2+ER/IR Goal status: INITIAL   PLAN:  PT FREQUENCY: 2x/week  PT DURATION: 12 weeks  PLANNED INTERVENTIONS: 97110-Therapeutic exercises, 97530- Therapeutic activity, 97112- Neuromuscular re-education, 97535- Self Care, 16109- Manual therapy, [id number]- Electrical stimulation (unattended), 97016- Vasopneumatic device, 97033- Ionotophoresis 4mg /ml Dexamethasone, Patient/Family education, Taping, Dry Needling, Joint mobilization, Joint manipulation, Spinal manipulation, Spinal mobilization, Cryotherapy, and Moist heat  PLAN FOR NEXT SESSION: R shoulder AAROM, isometrics, PROM, pain modalities    Cassie Freer, PT 01/30/2024, 8:40 AM

## 2024-01-30 ENCOUNTER — Ambulatory Visit: Payer: Commercial Managed Care - HMO | Attending: Family Medicine

## 2024-01-30 DIAGNOSIS — M25511 Pain in right shoulder: Secondary | ICD-10-CM | POA: Diagnosis present

## 2024-01-30 DIAGNOSIS — M6281 Muscle weakness (generalized): Secondary | ICD-10-CM | POA: Insufficient documentation

## 2024-01-30 DIAGNOSIS — R600 Localized edema: Secondary | ICD-10-CM | POA: Insufficient documentation

## 2024-01-30 DIAGNOSIS — M7501 Adhesive capsulitis of right shoulder: Secondary | ICD-10-CM | POA: Insufficient documentation

## 2024-01-30 DIAGNOSIS — G8929 Other chronic pain: Secondary | ICD-10-CM | POA: Insufficient documentation

## 2024-01-30 DIAGNOSIS — M25611 Stiffness of right shoulder, not elsewhere classified: Secondary | ICD-10-CM | POA: Diagnosis present

## 2024-02-02 DIAGNOSIS — R002 Palpitations: Secondary | ICD-10-CM | POA: Diagnosis not present

## 2024-02-06 ENCOUNTER — Other Ambulatory Visit: Payer: Self-pay

## 2024-02-06 MED ORDER — METOPROLOL SUCCINATE ER 25 MG PO TB24: 25.0000 mg | ORAL_TABLET | Freq: Every day | ORAL | 3 refills | Status: DC

## 2024-02-08 ENCOUNTER — Encounter: Payer: Self-pay | Admitting: Physical Therapy

## 2024-02-08 ENCOUNTER — Ambulatory Visit: Admitting: Physical Therapy

## 2024-02-08 DIAGNOSIS — M25511 Pain in right shoulder: Secondary | ICD-10-CM | POA: Diagnosis not present

## 2024-02-08 DIAGNOSIS — R600 Localized edema: Secondary | ICD-10-CM

## 2024-02-08 DIAGNOSIS — G8929 Other chronic pain: Secondary | ICD-10-CM

## 2024-02-08 DIAGNOSIS — M25611 Stiffness of right shoulder, not elsewhere classified: Secondary | ICD-10-CM

## 2024-02-08 DIAGNOSIS — M6281 Muscle weakness (generalized): Secondary | ICD-10-CM

## 2024-02-08 NOTE — Therapy (Signed)
 OUTPATIENT PHYSICAL THERAPY SHOULDER TREATMENT    Patient Name: Meagan Flores MRN: [id number] DOB:[birth year], 58 y.o., female Today's Date: 02/08/2024  END OF SESSION:  PT End of Session - 02/08/24 1018     Visit Number 2    Date for PT Re-Evaluation 04/23/24    Authorization Type [employer]    PT Start Time 1017    PT Stop Time 1050   unable to tolerate full session- pain   PT Time Calculation (min) 33 min    Activity Tolerance Patient limited by pain    Behavior During Therapy Flat affect              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   Prediabetes    Preop cardiovascular exam 11/25/2020   Pulmonary hypertension, unspecified (HCC) 11/25/2020   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   Rotator cuff impingement syndrome of right shoulder 09/30/2020   Skin rash 02/17/2021   Stroke [facility])    Tendinopathy of right biceps tendon 09/30/2020   Thyroid disease    TIA (transient ischemic attack) 04/15/2018   Tobacco use 09/28/2016   Past Surgical History:  Procedure Laterality Date   BICEPT TENODESIS Right 12/16/2021   Procedure: RIGHT BICEPS TENODESIS;  Surgeon: Tarry Kos, MD;  Location: [facility];  Service: Orthopedics;  Laterality: Right;   PARTIAL HYSTERECTOMY     right arm sx     SHOULDER ARTHROSCOPY WITH BICEPSTENOTOMY Right 01/21/2021   Procedure: SHOULDER ARTHROSCOPY WITH BICEPSTENOTOMY;  Surgeon: Tarry Kos, MD;   Location: [facility];  Service: Orthopedics;  Laterality: Right;   SHOULDER ARTHROSCOPY WITH DISTAL CLAVICLE RESECTION Right 01/12/2017   Procedure: RIGHT SHOULDER ARTHROSCOPY WITH  SUBACROMIAL DECOMPRESSION, DISTAL CLAVICLE EXCISION, extensive debridement;  Surgeon: Tarry Kos, MD;  Location:  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   Grief at loss of child 03/23/2023   Chronic right shoulder pain 07/01/2022   BMI 33.0-33.9,adult 05/31/2022   Biceps tendonosis of right shoulder 02/10/2022   S/P arthroscopy of right shoulder 02/10/2022   Eczema 02/17/2021   Pulmonary hypertension, unspecified (HCC) 11/25/2020   Prediabetes    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) tear of right shoulder 09/30/2020   Rotator cuff impingement syndrome of right shoulder 09/30/2020   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   Tendinopathy of right biceps tendon 09/30/2020   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   Aneurysm (HCC) 03/18/2020   History of TIA (transient ischemic attack) 02/14/2020   History of stroke    Mixed hyperlipidemia    Essential hypertension    Impingement syndrome of right shoulder 12/24/2016   Tobacco use 09/28/2016   Glenohumeral arthritis 12/27/2014   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    PCP: [name]  REFERRING PROVIDER: [name]  REFERRING DIAG:  M75.41 (ICD-10-CM) - Impingement syndrome of right shoulder    THERAPY DIAG:  Chronic right shoulder pain  Stiffness of right shoulder, not elsewhere classified  Muscle weakness (generalized)  Localized edema  Rationale for Evaluation and Treatment: Rehabilitation  ONSET DATE: ~5 years  SUBJECTIVE:                                                                                                                                                                                       SUBJECTIVE STATEMENT:  My arm is getting on my nerves. I'm hanging in there. Nothing new since last time, arm is just constantly hurting. Have been doing my exercises. It makes me angry because the doctor did 3 surgeries on my shoulder and then just threw me out there from this person to that person and give me injections that never helped me period. Did PT after each surgery. Extremely frustrated with the doctors especially the one who sent me to vocational rehab and the vocational therapist told me that it wasn't going to help.      EVAL: I been out of work for 3 years. I had 2 more surgeries since the last time I came here. My arm hurts me bad. [name] told me I had frozen shoulder in January. I put heat and ice on it and try to move it around.   Hand dominance: Right  PERTINENT HISTORY: TIA 2019 and 2021, HTN, hx of stroke, SLAP lesion 2021, ACJ arthrosis  R bicep tenodesis 2023 Shoulder arthroscopy 2022  58 yo female with ~5 yr hx of Right shoulder pain , underwent Right distal clavicular resection per Dr [name] in 2018, RIght biceps tenotomy and labral repair in 2022, Biceps tenodesis in 2023.  Pt had PT after each surgery.  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 Yes: NPRS scale: 7-8/10 Pain location: stays in R shoulder  Pain description: sharp pain Aggravating factors: lifting it up, using it Relieving factors: nothing- I try ice,heat,and pain pills but it does not help   PRECAUTIONS: None  RED FLAGS: None   WEIGHT BEARING RESTRICTIONS: No  FALLS:  Has patient fallen in last 6 months? Yes. Number of falls 2  LIVING ENVIRONMENT: Lives with: lives with their spouse Lives in: House/apartment Stairs:  No  OCCUPATION: Not working   PLOF: Independent and Independent with basic ADLs  PATIENT GOALS:I want to be able to use my arm   NEXT MD VISIT:   OBJECTIVE:  Note:  Objective measures were completed at Evaluation unless otherwise noted.   COGNITION: Overall cognitive status: Within functional limits for tasks assessed     SENSATION: WFL  POSTURE: Rounded shoulders  UPPER EXTREMITY ROM:   Active ROM Right eval Left eval  Shoulder flexion 95 w/pain   Shoulder extension    Shoulder abduction 55 w/pain   Shoulder adduction    Shoulder internal rotation Unable to measure d/t pain   Shoulder external rotation Unable to measure d/t pain   Elbow flexion    Elbow extension    Wrist flexion    Wrist extension    Wrist ulnar deviation    Wrist radial deviation    Wrist pronation    Wrist supination    (Blank rows = not tested)  UPPER EXTREMITY MMT:  MMT Right eval Left eval  Shoulder flexion 2-   Shoulder extension    Shoulder abduction 2-   Shoulder adduction    Shoulder internal rotation 2+   Shoulder external rotation 2+   Middle trapezius    Lower trapezius    Elbow flexion    Elbow extension    Wrist flexion    Wrist extension    Wrist ulnar deviation    Wrist radial deviation    Wrist pronation    Wrist supination    Grip strength (lbs)    (Blank rows = not tested)  SHOULDER SPECIAL TESTS: Impingement tests: Neer impingement test: positive , Hawkins/Kennedy impingement test: positive , and Painful arc test: positive  Rotator cuff assessment: Drop arm test: positive  and Empty can test: positive  Biceps assessment: Speed's test: positive   JOINT MOBILITY TESTING:  Very minimal movements, patient is super guarded and pain prevents movement   PALPATION:  TTP around R shoulder                                                                                                                              TREATMENT DATE:    02/08/24  PROM/stretch   of R shoulder to tolerance all directions with joint oscillations in between reps   Ionto 4 hour patch anterior R shoulder 1.45mL with appropriate education, wear time, precautions       EVAL 01/30/24   PATIENT EDUCATION: Education details: POC and HEP, impingement syndrome and adhesive capsulitis anatomy  Person educated: Patient Education method: Explanation Education comprehension: verbalized understanding  HOME EXERCISE PROGRAM: Access Code: [email or web address] URL: [email or web address] Date: 01/30/2024 Prepared by: Cassie Freer  Exercises - Seated Shoulder Flexion Towel Slide at Table Top  - 1 x daily - 7 x weekly - 3 sets - 10 reps - Seated Shoulder Abduction Towel Slide at Table Top  - 1 x daily - 7 x weekly - 3 sets - 10 reps - Seated Elbow Extension and Shoulder External Rotation AAROM at Table with Towel  - 1 x daily - 7 x weekly - 3 sets - 10 reps - Seated Elbow Flexion  Shoulder Internal Rotation AAROM at Table with Towel  - 1 x daily - 7 x weekly - 3 sets - 10 reps - Standing Isometric Shoulder Flexion with Doorway - Arm Bent  - 1 x daily - 7 x weekly - 3 sets - 10 reps - Standing Isometric Shoulder Internal Rotation with Towel Roll at Doorway  - 1 x daily - 7 x weekly - 3 sets - 10 reps - Standing Isometric Shoulder External Rotation with Doorway and Towel Roll  - 1 x daily - 7 x weekly - 3 sets - 10 reps - Standing Isometric Shoulder Abduction with Doorway - Arm Bent  - 1 x daily - 7 x weekly - 3 sets - 10 reps - Standing Isometric Shoulder Extension with Doorway - Arm Bent  - 1 x daily - 7 x weekly - 3 sets - 10 reps  ASSESSMENT:  CLINICAL IMPRESSION:  Pt arrives today understandably frustrated at her situation with her shoulder and feeling like "doctors have just been passing me onto the next one and no one helps, I hear different things from doctors and Dr. [name] told me I was hallucinating because my arm was hurting". PT provided empathetic listening as  appropriate during session, we worked on ROM gains and pain control as able and as time allowed today, pt perseverated severely on her frustrations with past providers and frustrations about her pain. Trialed first ionto patch this session. Interventions limited today, very poor tolerance.     EVAL: Patient is a 59 y.o. female who was seen today for physical therapy evaluation and treatment for R chronic shoulder pain. She has had ongoing pain for years and multiple surgeries. Patient presents with decreased ROM and strength with all movements. She has been unable to work for 3 years now due to the shoulder pain. Her pain increases with movements that require her to lift her arm up at all. Patient will benefit from skilled PT to address her R shoulder pain to be able to complete her ADLS without difficulty.   OBJECTIVE IMPAIRMENTS: difficulty walking, decreased ROM, decreased strength, impaired UE functional use, improper body mechanics, and pain.   ACTIVITY LIMITATIONS: carrying, lifting, bathing, dressing, reach over head, and hygiene/grooming  PARTICIPATION LIMITATIONS: shopping, occupation, yard work, and church  PERSONAL FACTORS: Age, Behavior pattern, Past/current experiences, and Time since onset of injury/illness/exacerbation are also affecting patient's functional outcome.   REHAB POTENTIAL: Fair chronicity of impairments, multiple surgeries    CLINICAL DECISION MAKING: Evolving/moderate complexity  EVALUATION COMPLEXITY: Low  GOALS: Goals reviewed with patient? Yes  SHORT TERM GOALS: Target date: 03/12/24  Patient will be independent with initial HEP.  Baseline: given 01/30/24 Goal status: INITIAL   LONG TERM GOALS: Target date: 04/23/24  Patient will be independent with advanced/ongoing HEP to improve outcomes and carryover.  Baseline:  Goal status: INITIAL  2.  Patient will report 75% improvement in R shoulder pain to improve QOL. (3/10 pain or better) Baseline:  7/10 Goal status: INITIAL  3.  Patient to improve R shoulder AROM to [facility] without pain provocation to allow for increased ease of ADLs.  Baseline: see chart Goal status: INITIAL  4.  Patient will demonstrate improved functional UE strength as demonstrated by 4/5. Baseline: 2-flex/abd, 2+ER/IR Goal status: INITIAL   PLAN:  PT FREQUENCY: 2x/week  PT DURATION: 12 weeks  PLANNED INTERVENTIONS: 97110-Therapeutic exercises, 97530- Therapeutic activity, [id number]- Neuromuscular re-education, 97535- Self Care, 16109- Manual therapy, [id number]- Electrical stimulation (unattended), 97016- Vasopneumatic device, [id number]- Ionotophoresis  4mg /ml Dexamethasone, Patient/Family education, Taping, Dry Needling, Joint mobilization, Joint manipulation, Spinal manipulation, Spinal mobilization, Cryotherapy, and Moist heat  PLAN FOR NEXT SESSION: R shoulder AAROM, isometrics, PROM, pain modalities. How did she feel about ionto, was it helpful?   Nedra Hai, PT, DPT 02/08/24 10:52 AM

## 2024-02-10 ENCOUNTER — Ambulatory Visit

## 2024-02-10 ENCOUNTER — Telehealth: Payer: Self-pay

## 2024-02-10 ENCOUNTER — Other Ambulatory Visit: Payer: Self-pay

## 2024-02-10 NOTE — Telephone Encounter (Signed)
 Pharmacy Patient Advocate Encounter  Received notification from [facility] that Prior Authorization for LIDOCAINE 5% has been APPROVED from 02/09/2024 to 02/08/2025   PA #/Case ID/Reference #: [id number]  Pharmacy notified. Left voicemail for patient.

## 2024-02-14 ENCOUNTER — Ambulatory Visit: Admitting: Physical Therapy

## 2024-02-14 DIAGNOSIS — M25511 Pain in right shoulder: Secondary | ICD-10-CM | POA: Diagnosis not present

## 2024-02-14 DIAGNOSIS — M25611 Stiffness of right shoulder, not elsewhere classified: Secondary | ICD-10-CM

## 2024-02-14 DIAGNOSIS — M6281 Muscle weakness (generalized): Secondary | ICD-10-CM

## 2024-02-14 DIAGNOSIS — G8929 Other chronic pain: Secondary | ICD-10-CM

## 2024-02-14 NOTE — Therapy (Deleted)
 [facility] at [facility] [address] [facility] [place], [place], [address] Phone: [phone]   Fax:  [phone]  Patient Details  Name: Meagan Flores MRN: [id number] Date of Birth: [birth year] Referring Provider:  [name], MD  Encounter Date: 02/14/2024   Suanne Marker, PTA 02/14/2024, 10:03 AM  [facility] [place] [address] [facility] [place], [place], [address] Phone: [phone]   Fax:  [phone]

## 2024-02-14 NOTE — Therapy (Signed)
 OUTPATIENT PHYSICAL THERAPY SHOULDER TREATMENT    Patient Name: Meagan Flores MRN: [id number] DOB:06-Oct-1966, 58 y.o., female Today's Date: 02/14/2024  END OF SESSION:  PT End of Session - 02/14/24 1002     Visit Number 3    Date for PT Re-Evaluation 04/23/24    Authorization Type [employer]    PT Start Time 1010    PT Stop Time 1040    PT Time Calculation (min) 30 min              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   Prediabetes    Preop cardiovascular exam 11/25/2020   Pulmonary hypertension, unspecified (HCC) 11/25/2020   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   Rotator cuff impingement syndrome of right shoulder 09/30/2020   Skin rash 02/17/2021   Stroke [facility])    Tendinopathy of right biceps tendon 09/30/2020   Thyroid disease    TIA (transient ischemic attack) 04/15/2018   Tobacco use 09/28/2016   Past Surgical History:  Procedure Laterality Date   BICEPT TENODESIS Right 12/16/2021   Procedure: RIGHT BICEPS TENODESIS;  Surgeon: Tarry Kos, MD;  Location: [facility];  Service: Orthopedics;  Laterality: Right;   PARTIAL HYSTERECTOMY     right arm sx     SHOULDER ARTHROSCOPY WITH BICEPSTENOTOMY Right 01/21/2021   Procedure: SHOULDER ARTHROSCOPY WITH BICEPSTENOTOMY;  Surgeon: Tarry Kos, MD;  Location: [facility];  Service: Orthopedics;  Laterality: Right;   SHOULDER ARTHROSCOPY WITH DISTAL CLAVICLE  RESECTION Right 01/12/2017   Procedure: RIGHT SHOULDER ARTHROSCOPY WITH  SUBACROMIAL DECOMPRESSION, DISTAL CLAVICLE EXCISION, extensive debridement;  Surgeon: Tarry Kos, MD;  Location: 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   Grief at loss of child 03/23/2023   Chronic right shoulder pain 07/01/2022   BMI 33.0-33.9,adult 05/31/2022   Biceps tendonosis of right shoulder 02/10/2022   S/P arthroscopy of right shoulder 02/10/2022   Eczema 02/17/2021   Pulmonary hypertension, unspecified (HCC) 11/25/2020   Prediabetes    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) tear of right shoulder 09/30/2020   Rotator cuff impingement syndrome of right shoulder 09/30/2020   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   Tendinopathy of right biceps tendon 09/30/2020   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   Aneurysm (HCC) 03/18/2020   History of TIA (transient ischemic attack) 02/14/2020   History of stroke    Mixed hyperlipidemia    Essential hypertension    Impingement syndrome of right shoulder 12/24/2016   Tobacco use 09/28/2016   Glenohumeral arthritis 12/27/2014   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    PCP: [name]  REFERRING PROVIDER: [name]  REFERRING DIAG:  M75.41 (ICD-10-CM) - Impingement syndrome of right shoulder    THERAPY DIAG:  Chronic right shoulder pain  Stiffness of right shoulder,  not elsewhere classified  Muscle weakness (generalized)  Rationale for Evaluation and Treatment: Rehabilitation  ONSET DATE: ~5 years  SUBJECTIVE:                                                                                                                                                                                      SUBJECTIVE STATEMENT: Arm is just  awful , picking up lidocaine patches when I leave here. Mom and Dad both died since I was here last     Hand dominance: Right  PERTINENT HISTORY: TIA 2019 and 2021, HTN, hx of stroke, SLAP lesion 2021, ACJ arthrosis  R bicep tenodesis 2023 Shoulder arthroscopy 2022  58 yo female with ~5 yr hx of Right shoulder pain , underwent Right distal clavicular resection per Dr [name] in 2018, RIght biceps tenotomy and labral repair in 2022, Biceps tenodesis in 2023.  Pt had PT after each surgery.  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 Yes: NPRS scale: 7-8/10 Pain location: stays in R shoulder  Pain description: sharp pain Aggravating factors: lifting it up, using it Relieving factors: nothing- I try ice,heat,and pain pills but it does not help   PRECAUTIONS: None  RED FLAGS: None   WEIGHT BEARING RESTRICTIONS: No  FALLS:  Has patient fallen in last 6 months? Yes. Number of falls 2  LIVING ENVIRONMENT: Lives with: lives with their spouse Lives in: House/apartment Stairs: No  OCCUPATION: Not working   PLOF: Independent and Independent with basic ADLs  PATIENT GOALS:I want to be able to use my arm   NEXT MD VISIT:   OBJECTIVE:  Note: Objective measures were completed at Evaluation unless otherwise noted.   COGNITION: Overall cognitive status: Within functional limits for tasks assessed     SENSATION: WFL  POSTURE: Rounded shoulders  UPPER EXTREMITY ROM:   Active ROM Right eval Left eval RT standing 02/14/24  Shoulder flexion 95 w/pain  95  Shoulder extension     Shoulder abduction 55 w/pain  55  Shoulder adduction     Shoulder internal rotation Unable to measure d/t pain  Arm at side 75  Shoulder external rotation Unable to measure d/t pain  Arm at side 45   Elbow flexion     Elbow extension     Wrist  flexion     Wrist extension     Wrist ulnar deviation     Wrist radial deviation     Wrist pronation     Wrist supination     (  Blank rows = not tested)  UPPER EXTREMITY MMT:  MMT Right eval Left eval  Shoulder flexion 2-   Shoulder extension    Shoulder abduction 2-   Shoulder adduction    Shoulder internal rotation 2+   Shoulder external rotation 2+   Middle trapezius    Lower trapezius    Elbow flexion    Elbow extension    Wrist flexion    Wrist extension    Wrist ulnar deviation    Wrist radial deviation    Wrist pronation    Wrist supination    Grip strength (lbs)    (Blank rows = not tested)  SHOULDER SPECIAL TESTS: Impingement tests: Neer impingement test: positive , Hawkins/Kennedy impingement test: positive , and Painful arc test: positive  Rotator cuff assessment: Drop arm test: positive  and Empty can test: positive  Biceps assessment: Speed's test: positive   JOINT MOBILITY TESTING:  Very minimal movements, patient is super guarded and pain prevents movement   PALPATION:  TTP around R shoulder                                                                                                                              TREATMENT DATE:   02/14/24 PROM/stretch  of R shoulder to tolerance all directions with joint oscillations in between reps . Neural tension stretching RT UE + Self NT stretching on wall -issued to do at home 2# rotation behind back 5x 2# cane shld ext and IR 8 x each 2# cane shld row and bicep 8x each 3# shrugs, shld rolls backward, shld ext,scap squeeze AROM taken - pain with all motions   02/08/24  PROM/stretch  of R shoulder to tolerance all directions with joint oscillations in between reps   Ionto 4 hour patch anterior R shoulder 1.63mL with appropriate education, wear time, precautions       EVAL 01/30/24   PATIENT EDUCATION: Education details: POC and HEP, impingement syndrome and adhesive capsulitis anatomy  Person  educated: Patient Education method: Explanation Education comprehension: verbalized understanding  HOME EXERCISE PROGRAM: Access Code: [email or web address] URL: [email or web address] Date: 01/30/2024 Prepared by: Cassie Freer  Exercises - Seated Shoulder Flexion Towel Slide at Table Top  - 1 x daily - 7 x weekly - 3 sets - 10 reps - Seated Shoulder Abduction Towel Slide at Table Top  - 1 x daily - 7 x weekly - 3 sets - 10 reps - Seated Elbow Extension and Shoulder External Rotation AAROM at Table with Towel  - 1 x daily - 7 x weekly - 3 sets - 10 reps - Seated Elbow Flexion Shoulder Internal Rotation AAROM at Table with Towel  - 1 x daily - 7 x weekly - 3 sets - 10 reps - Standing Isometric Shoulder Flexion with Doorway - Arm Bent  - 1 x daily - 7 x weekly - 3 sets - 10 reps - Standing Isometric Shoulder Internal Rotation with Towel Roll  at Doorway  - 1 x daily - 7 x weekly - 3 sets - 10 reps - Standing Isometric Shoulder External Rotation with Doorway and Towel Roll  - 1 x daily - 7 x weekly - 3 sets - 10 reps - Standing Isometric Shoulder Abduction with Doorway - Arm Bent  - 1 x daily - 7 x weekly - 3 sets - 10 reps - Standing Isometric Shoulder Extension with Doorway - Arm Bent  - 1 x daily - 7 x weekly - 3 sets - 10 reps  ASSESSMENT:  CLINICAL IMPRESSION: AROM measures taken and goasl assesses. Pain with all ROM. Pt verb doing HEP. Pt was positive for UE neural tension- better as session progressed and issued as HEP. Limited session d/t tolerance.   OBJECTIVE IMPAIRMENTS: difficulty walking, decreased ROM, decreased strength, impaired UE functional use, improper body mechanics, and pain.   ACTIVITY LIMITATIONS: carrying, lifting, bathing, dressing, reach over head, and hygiene/grooming  PARTICIPATION LIMITATIONS: shopping, occupation, yard work, and church  PERSONAL FACTORS: Age, Behavior pattern, Past/current experiences, and Time since onset of injury/illness/exacerbation are  also affecting patient's functional outcome.   REHAB POTENTIAL: Fair chronicity of impairments, multiple surgeries    CLINICAL DECISION MAKING: Evolving/moderate complexity  EVALUATION COMPLEXITY: Low  GOALS: Goals reviewed with patient? Yes  SHORT TERM GOALS: Target date: 03/12/24  Patient will be independent with initial HEP.  Baseline: given 01/30/24 Goal status: 02/14/24 MET   LONG TERM GOALS: Target date: 04/23/24  Patient will be independent with advanced/ongoing HEP to improve outcomes and carryover.  Baseline:  Goal status: INITIAL  2.  Patient will report 75% improvement in R shoulder pain to improve QOL. (3/10 pain or better) Baseline: 7/10 Goal status: INITIAL  3.  Patient to improve R shoulder AROM to [facility] without pain provocation to allow for increased ease of ADLs.  Baseline: see chart Goal status: INITIAL  4.  Patient will demonstrate improved functional UE strength as demonstrated by 4/5. Baseline: 2-flex/abd, 2+ER/IR Goal status: INITIAL   PLAN:  PT FREQUENCY: 2x/week  PT DURATION: 12 weeks  PLANNED INTERVENTIONS: 97110-Therapeutic exercises, 97530- Therapeutic activity, [id number]- Neuromuscular re-education, 97535- Self Care, 27253- Manual therapy, G0283- Electrical stimulation (unattended), 97016- Vasopneumatic device, 97033- Ionotophoresis 4mg /ml Dexamethasone, Patient/Family education, Taping, Dry Needling, Joint mobilization, Joint manipulation, Spinal manipulation, Spinal mobilization, Cryotherapy, and Moist heat  PLAN FOR NEXT SESSION: NT tension stretching assess how it is going for HEP. Progress fucn ROM as tolerated Patient Details  Name: Meagan Flores MRN: [id number] Date of Birth: 04/02/1966 Referring Provider:  [name], MD  Encounter Date: 02/14/2024   Suanne Marker, PTA 02/14/2024, 10:41 AM  [facility] [address] [place], [place], [address] Phone: [phone]   Fax:   [phone]

## 2024-02-15 ENCOUNTER — Ambulatory Visit

## 2024-02-17 ENCOUNTER — Ambulatory Visit: Admitting: Physical Therapy

## 2024-02-17 ENCOUNTER — Encounter: Payer: Self-pay | Admitting: Physical Therapy

## 2024-02-17 DIAGNOSIS — M25611 Stiffness of right shoulder, not elsewhere classified: Secondary | ICD-10-CM

## 2024-02-17 DIAGNOSIS — G8929 Other chronic pain: Secondary | ICD-10-CM

## 2024-02-17 DIAGNOSIS — M25511 Pain in right shoulder: Secondary | ICD-10-CM | POA: Diagnosis not present

## 2024-02-17 DIAGNOSIS — R600 Localized edema: Secondary | ICD-10-CM

## 2024-02-17 DIAGNOSIS — M6281 Muscle weakness (generalized): Secondary | ICD-10-CM

## 2024-02-17 NOTE — Therapy (Signed)
 OUTPATIENT PHYSICAL THERAPY SHOULDER TREATMENT    Patient Name: Meagan COWING MRN: [id number] DOB:07/01/1966, 58 y.o., female Today's Date: 02/17/2024  END OF SESSION:  PT End of Session - 02/17/24 0928     Visit Number 4    Date for PT Re-Evaluation 04/23/24    PT Start Time 0930    PT Stop Time 1015    PT Time Calculation (min) 45 min    Activity Tolerance Patient limited by pain    Behavior During Therapy Flat affect              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   Prediabetes    Preop cardiovascular exam 11/25/2020   Pulmonary hypertension, unspecified (HCC) 11/25/2020   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   Rotator cuff impingement syndrome of right shoulder 09/30/2020   Skin rash 02/17/2021   Stroke [facility])    Tendinopathy of right biceps tendon 09/30/2020   Thyroid disease    TIA (transient ischemic attack) 04/15/2018   Tobacco use 09/28/2016   Past Surgical History:  Procedure Laterality Date   BICEPT TENODESIS Right 12/16/2021   Procedure: RIGHT BICEPS TENODESIS;  Surgeon: Tarry Kos, MD;  Location: [facility];  Service: Orthopedics;  Laterality: Right;   PARTIAL HYSTERECTOMY     right arm sx     SHOULDER ARTHROSCOPY WITH BICEPSTENOTOMY Right 01/21/2021   Procedure: SHOULDER ARTHROSCOPY WITH BICEPSTENOTOMY;  Surgeon: Tarry Kos, MD;  Location: [facility];  Service: Orthopedics;  Laterality:  Right;   SHOULDER ARTHROSCOPY WITH DISTAL CLAVICLE RESECTION Right 01/12/2017   Procedure: RIGHT SHOULDER ARTHROSCOPY WITH  SUBACROMIAL DECOMPRESSION, DISTAL CLAVICLE EXCISION, extensive debridement;  Surgeon: Tarry Kos, MD;  Location: 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   Grief at loss of child 03/23/2023   Chronic right shoulder pain 07/01/2022   BMI 33.0-33.9,adult 05/31/2022   Biceps tendonosis of right shoulder 02/10/2022   S/P arthroscopy of right shoulder 02/10/2022   Eczema 02/17/2021   Pulmonary hypertension, unspecified (HCC) 11/25/2020   Prediabetes    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) tear of right shoulder 09/30/2020   Rotator cuff impingement syndrome of right shoulder 09/30/2020   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   Tendinopathy of right biceps tendon 09/30/2020   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   Aneurysm (HCC) 03/18/2020   History of TIA (transient ischemic attack) 02/14/2020   History of stroke    Mixed hyperlipidemia    Essential hypertension    Impingement syndrome of right shoulder 12/24/2016   Tobacco use 09/28/2016   Glenohumeral arthritis 12/27/2014   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    PCP: [name]  REFERRING PROVIDER: [name]  REFERRING DIAG:  M75.41 (ICD-10-CM) - Impingement syndrome of right shoulder    THERAPY  DIAG:  Chronic right shoulder pain  Stiffness of right shoulder, not elsewhere classified  Localized edema  Muscle weakness (generalized)  Rationale for Evaluation and Treatment: Rehabilitation  ONSET DATE: ~5 years  SUBJECTIVE:                                                                                                                                                                                       SUBJECTIVE STATEMENT: Mind is not in it today, it marks the one year anniversary her son was murdered     Hand dominance: Right  PERTINENT HISTORY: TIA 2019 and 2021, HTN, hx of stroke, SLAP lesion 2021, ACJ arthrosis  R bicep tenodesis 2023 Shoulder arthroscopy 2022  58 yo female with ~5 yr hx of Right shoulder pain , underwent Right distal clavicular resection per Dr [name] in 2018, RIght biceps tenotomy and labral repair in 2022, Biceps tenodesis in 2023.  Pt had PT after each surgery.  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 Yes: NPRS scale: 7/10 Pain location: stays in R shoulder  Pain description: sharp pain Aggravating factors: lifting it up, using it Relieving factors: nothing- I try ice,heat,and pain pills but it does not help   PRECAUTIONS: None  RED FLAGS: None   WEIGHT BEARING RESTRICTIONS: No  FALLS:  Has patient fallen in last 6 months? Yes. Number of falls 2  LIVING ENVIRONMENT: Lives with: lives with their spouse Lives in: House/apartment Stairs: No  OCCUPATION: Not working   PLOF: Independent and Independent with basic ADLs  PATIENT GOALS:I want to be able to use my arm   NEXT MD VISIT:   OBJECTIVE:  Note: Objective measures were completed at Evaluation unless otherwise noted.   COGNITION: Overall cognitive status: Within functional limits for tasks assessed     SENSATION: WFL  POSTURE: Rounded shoulders  UPPER EXTREMITY ROM:   Active ROM Right eval Left eval RT standing 02/14/24  Shoulder flexion 95 w/pain  95  Shoulder extension     Shoulder abduction 55 w/pain  55  Shoulder adduction     Shoulder internal rotation Unable to measure d/t pain  Arm at side 75  Shoulder external rotation Unable to measure d/t pain  Arm at side 45   Elbow  flexion     Elbow extension     Wrist flexion     Wrist extension     Wrist ulnar deviation     Wrist radial deviation     Wrist  pronation     Wrist supination     (Blank rows = not tested)  UPPER EXTREMITY MMT:  MMT Right eval Left eval  Shoulder flexion 2-   Shoulder extension    Shoulder abduction 2-   Shoulder adduction    Shoulder internal rotation 2+   Shoulder external rotation 2+   Middle trapezius    Lower trapezius    Elbow flexion    Elbow extension    Wrist flexion    Wrist extension    Wrist ulnar deviation    Wrist radial deviation    Wrist pronation    Wrist supination    Grip strength (lbs)    (Blank rows = not tested)  SHOULDER SPECIAL TESTS: Impingement tests: Neer impingement test: positive , Hawkins/Kennedy impingement test: positive , and Painful arc test: positive  Rotator cuff assessment: Drop arm test: positive  and Empty can test: positive  Biceps assessment: Speed's test: positive   JOINT MOBILITY TESTING:  Very minimal movements, patient is super guarded and pain prevents movement   PALPATION:  TTP around R shoulder                                                                                                                              TREATMENT DATE:  02/17/24 AAROM shoulder Flex, Ext, IR x5 each Rows & Ext yellow 2x10 PROM/stretch  of R shoulder to tolerance all directions with joint oscillations in between reps . 02/14/24 PROM/stretch  of R shoulder to tolerance all directions with joint oscillations in between reps . Neural tension stretching RT UE + Self NT stretching on wall -issued to do at home 2# rotation behind back 5x 2# cane shld ext and IR 8 x each 2# cane shld row and bicep 8x each 3# shrugs, shld rolls backward, shld ext,scap squeeze AROM taken - pain with all motions   02/08/24  PROM/stretch  of R shoulder to tolerance all directions with joint oscillations in between reps   Ionto 4 hour patch anterior R  shoulder 1.19mL with appropriate education, wear time, precautions       EVAL 01/30/24   PATIENT EDUCATION: Education details: POC and HEP, impingement syndrome and adhesive capsulitis anatomy  Person educated: Patient Education method: Explanation Education comprehension: verbalized understanding  HOME EXERCISE PROGRAM: Access Code: [email or web address] URL: [email or web address] Date: 01/30/2024 Prepared by: Cassie Freer  Exercises - Seated Shoulder Flexion Towel Slide at Table Top  - 1 x daily - 7 x weekly - 3 sets - 10 reps - Seated Shoulder Abduction Towel Slide at Table Top  - 1 x daily - 7 x weekly - 3 sets - 10 reps - Seated Elbow Extension and Shoulder External Rotation AAROM at Table with Towel  - 1 x daily - 7 x weekly - 3 sets - 10 reps - Seated Elbow Flexion Shoulder Internal Rotation AAROM at Table with Towel  - 1 x daily - 7 x weekly -  3 sets - 10 reps - Standing Isometric Shoulder Flexion with Doorway - Arm Bent  - 1 x daily - 7 x weekly - 3 sets - 10 reps - Standing Isometric Shoulder Internal Rotation with Towel Roll at Doorway  - 1 x daily - 7 x weekly - 3 sets - 10 reps - Standing Isometric Shoulder External Rotation with Doorway and Towel Roll  - 1 x daily - 7 x weekly - 3 sets - 10 reps - Standing Isometric Shoulder Abduction with Doorway - Arm Bent  - 1 x daily - 7 x weekly - 3 sets - 10 reps - Standing Isometric Shoulder Extension with Doorway - Arm Bent  - 1 x daily - 7 x weekly - 3 sets - 10 reps  ASSESSMENT:  CLINICAL IMPRESSION:  Pain with all ROM both actively and passively. Shorten session due to pt  request and subjective reports. She seems to tolerate rows and extentons the best.   OBJECTIVE IMPAIRMENTS: difficulty walking, decreased ROM, decreased strength, impaired UE functional use, improper body mechanics, and pain.   ACTIVITY LIMITATIONS: carrying, lifting, bathing, dressing, reach over head, and hygiene/grooming  PARTICIPATION LIMITATIONS:  shopping, occupation, yard work, and church  PERSONAL FACTORS: Age, Behavior pattern, Past/current experiences, and Time since onset of injury/illness/exacerbation are also affecting patient's functional outcome.   REHAB POTENTIAL: Fair chronicity of impairments, multiple surgeries    CLINICAL DECISION MAKING: Evolving/moderate complexity  EVALUATION COMPLEXITY: Low  GOALS: Goals reviewed with patient? Yes  SHORT TERM GOALS: Target date: 03/12/24  Patient will be independent with initial HEP.  Baseline: given 01/30/24 Goal status: 02/14/24 MET   LONG TERM GOALS: Target date: 04/23/24  Patient will be independent with advanced/ongoing HEP to improve outcomes and carryover.  Baseline:  Goal status: INITIAL  2.  Patient will report 75% improvement in R shoulder pain to improve QOL. (3/10 pain or better) Baseline: 7/10 Goal status: INITIAL  3.  Patient to improve R shoulder AROM to [facility] without pain provocation to allow for increased ease of ADLs.  Baseline: see chart Goal status: INITIAL  4.  Patient will demonstrate improved functional UE strength as demonstrated by 4/5. Baseline: 2-flex/abd, 2+ER/IR Goal status: INITIAL   PLAN:  PT FREQUENCY: 2x/week  PT DURATION: 12 weeks  PLANNED INTERVENTIONS: 97110-Therapeutic exercises, 97530- Therapeutic activity, [id number]- Neuromuscular re-education, 97535- Self Care, 31517- Manual therapy, G0283- Electrical stimulation (unattended), 97016- Vasopneumatic device, 97033- Ionotophoresis 4mg /ml Dexamethasone, Patient/Family education, Taping, Dry Needling, Joint mobilization, Joint manipulation, Spinal manipulation, Spinal mobilization, Cryotherapy, and Moist heat  PLAN FOR NEXT SESSION: NT tension stretching assess how it is going for HEP. Progress fucn ROM as tolerated Patient Details  Name: SHENICKA SUNDERLIN MRN: [id number] Date of Birth: 1966/09/25 Referring Provider:  [name], MD   Grayce Sessions, PTA 02/17/2024, 9:29  AM

## 2024-02-21 ENCOUNTER — Ambulatory Visit: Admitting: Physical Therapy

## 2024-02-21 DIAGNOSIS — M25611 Stiffness of right shoulder, not elsewhere classified: Secondary | ICD-10-CM

## 2024-02-21 DIAGNOSIS — G8929 Other chronic pain: Secondary | ICD-10-CM

## 2024-02-21 DIAGNOSIS — M6281 Muscle weakness (generalized): Secondary | ICD-10-CM

## 2024-02-21 DIAGNOSIS — M25511 Pain in right shoulder: Secondary | ICD-10-CM | POA: Diagnosis not present

## 2024-02-21 NOTE — Therapy (Signed)
 OUTPATIENT PHYSICAL THERAPY SHOULDER TREATMENT    Patient Name: Meagan Flores MRN: [id number] DOB:August 08, 1966, 58 y.o., female Today's Date: 02/21/2024  END OF SESSION:  PT End of Session - 02/21/24 1017     Visit Number 5    Date for PT Re-Evaluation 04/23/24    Authorization Type [employer]    PT Start Time 1017    PT Stop Time 1055    PT Time Calculation (min) 38 min              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   Prediabetes    Preop cardiovascular exam 11/25/2020   Pulmonary hypertension, unspecified (HCC) 11/25/2020   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   Rotator cuff impingement syndrome of right shoulder 09/30/2020   Skin rash 02/17/2021   Stroke [facility])    Tendinopathy of right biceps tendon 09/30/2020   Thyroid disease    TIA (transient ischemic attack) 04/15/2018   Tobacco use 09/28/2016   Past Surgical History:  Procedure Laterality Date   BICEPT TENODESIS Right 12/16/2021   Procedure: RIGHT BICEPS TENODESIS;  Surgeon: Tarry Kos, MD;  Location: [facility];  Service: Orthopedics;  Laterality: Right;   PARTIAL HYSTERECTOMY     right arm sx     SHOULDER ARTHROSCOPY WITH BICEPSTENOTOMY Right 01/21/2021   Procedure: SHOULDER ARTHROSCOPY WITH BICEPSTENOTOMY;  Surgeon: Tarry Kos, MD;  Location: [facility];  Service: Orthopedics;  Laterality: Right;   SHOULDER ARTHROSCOPY WITH DISTAL CLAVICLE  RESECTION Right 01/12/2017   Procedure: RIGHT SHOULDER ARTHROSCOPY WITH  SUBACROMIAL DECOMPRESSION, DISTAL CLAVICLE EXCISION, extensive debridement;  Surgeon: Tarry Kos, MD;  Location: 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   Grief at loss of child 03/23/2023   Chronic right shoulder pain 07/01/2022   BMI 33.0-33.9,adult 05/31/2022   Biceps tendonosis of right shoulder 02/10/2022   S/P arthroscopy of right shoulder 02/10/2022   Eczema 02/17/2021   Pulmonary hypertension, unspecified (HCC) 11/25/2020   Prediabetes    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) tear of right shoulder 09/30/2020   Rotator cuff impingement syndrome of right shoulder 09/30/2020   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   Tendinopathy of right biceps tendon 09/30/2020   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   Aneurysm (HCC) 03/18/2020   History of TIA (transient ischemic attack) 02/14/2020   History of stroke    Mixed hyperlipidemia    Essential hypertension    Impingement syndrome of right shoulder 12/24/2016   Tobacco use 09/28/2016   Glenohumeral arthritis 12/27/2014   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    PCP: [name]  REFERRING PROVIDER: [name]  REFERRING DIAG:  M75.41 (ICD-10-CM) - Impingement syndrome of right shoulder    THERAPY DIAG:  Chronic right shoulder pain  Stiffness of right shoulder,  not elsewhere classified  Muscle weakness (generalized)  Rationale for Evaluation and Treatment: Rehabilitation  ONSET DATE: ~5 years  SUBJECTIVE:                                                                                                                                                                                      SUBJECTIVE STATEMENT: Not doing  good, trying to ex. Certain ones aggravate it worse     Hand dominance: Right  PERTINENT HISTORY: TIA 2019 and 2021, HTN, hx of stroke, SLAP lesion 2021, ACJ arthrosis  R bicep tenodesis 2023 Shoulder arthroscopy 2022  59 yo female with ~5 yr hx of Right shoulder pain , underwent Right distal clavicular resection per Dr [name] in 2018, RIght biceps tenotomy and labral repair in 2022, Biceps tenodesis in 2023.  Pt had PT after each surgery.  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 Yes: NPRS scale: 7/10 Pain location: stays in R shoulder  Pain description: sharp pain Aggravating factors: lifting it up, using it Relieving factors: nothing- I try ice,heat,and pain pills but it does not help   PRECAUTIONS: None  RED FLAGS: None   WEIGHT BEARING RESTRICTIONS: No  FALLS:  Has patient fallen in last 6 months? Yes. Number of falls 2  LIVING ENVIRONMENT: Lives with: lives with their spouse Lives in: House/apartment Stairs: No  OCCUPATION: Not working   PLOF: Independent and Independent with basic ADLs  PATIENT GOALS:I want to be able to use my arm   NEXT MD VISIT:   OBJECTIVE:  Note: Objective measures were completed at Evaluation unless otherwise noted.   COGNITION: Overall cognitive status: Within functional limits for tasks assessed     SENSATION: WFL  POSTURE: Rounded shoulders  UPPER EXTREMITY ROM:   Active ROM Right eval Left eval RT standing 02/14/24  Shoulder flexion 95 w/pain  95  Shoulder extension     Shoulder abduction 55 w/pain  55  Shoulder adduction     Shoulder internal rotation Unable to measure d/t pain  Arm at side 75  Shoulder external rotation Unable to measure d/t pain  Arm at side 45   Elbow flexion     Elbow extension     Wrist flexion     Wrist extension     Wrist ulnar  deviation     Wrist radial deviation     Wrist pronation     Wrist supination     (Blank rows = not tested)  UPPER EXTREMITY  MMT:  MMT Right eval Left eval  Shoulder flexion 2-   Shoulder extension    Shoulder abduction 2-   Shoulder adduction    Shoulder internal rotation 2+   Shoulder external rotation 2+   Middle trapezius    Lower trapezius    Elbow flexion    Elbow extension    Wrist flexion    Wrist extension    Wrist ulnar deviation    Wrist radial deviation    Wrist pronation    Wrist supination    Grip strength (lbs)    (Blank rows = not tested)  SHOULDER SPECIAL TESTS: Impingement tests: Neer impingement test: positive , Hawkins/Kennedy impingement test: positive , and Painful arc test: positive  Rotator cuff assessment: Drop arm test: positive  and Empty can test: positive  Biceps assessment: Speed's test: positive   JOINT MOBILITY TESTING:  Very minimal movements, patient is super guarded and pain prevents movement   PALPATION:  TTP around R shoulder                                                                                                                              TREATMENT DATE:   02/21/24 Seated pulleys flex,abd and ER 10 x with some assist Standing shld ext and IR UBE 2 min each way L 1 Rolling ball on ball fwd, back and laterally, CW and CCW 10 x each Row and shld ext yellow tband 2 sets 10 Nustep standing L 1 push and pull then horz abd 2 min PROM/stretch  of R shoulder to tolerance all directions with joint oscillations in between reps .    02/17/24 AAROM shoulder Flex, Ext, IR x5 each Rows & Ext yellow 2x10 PROM/stretch  of R shoulder to tolerance all directions with joint oscillations in between reps . 02/14/24 PROM/stretch  of R shoulder to tolerance all directions with joint oscillations in between reps . Neural tension stretching RT UE + Self NT stretching on wall -issued to do at home 2# rotation behind back 5x 2# cane  shld ext and IR 8 x each 2# cane shld row and bicep 8x each 3# shrugs, shld rolls backward, shld ext,scap squeeze AROM taken - pain with all motions   02/08/24  PROM/stretch  of R shoulder to tolerance all directions with joint oscillations in between reps   Ionto 4 hour patch anterior R shoulder 1.87mL with appropriate education, wear time, precautions       EVAL 01/30/24   PATIENT EDUCATION: Education details: POC and HEP, impingement syndrome and adhesive capsulitis anatomy  Person educated: Patient Education method: Explanation Education comprehension: verbalized understanding  HOME EXERCISE PROGRAM: Access Code: [email or web address] URL: [email or web address] Date: 01/30/2024 Prepared by: Cassie Freer  Exercises - Seated Shoulder Flexion Towel Slide at Table Top  - 1 x daily - 7 x weekly - 3 sets - 10 reps - Seated Shoulder Abduction Towel Slide at Table Top  - 1 x daily -  7 x weekly - 3 sets - 10 reps - Seated Elbow Extension and Shoulder External Rotation AAROM at Table with Towel  - 1 x daily - 7 x weekly - 3 sets - 10 reps - Seated Elbow Flexion Shoulder Internal Rotation AAROM at Table with Towel  - 1 x daily - 7 x weekly - 3 sets - 10 reps - Standing Isometric Shoulder Flexion with Doorway - Arm Bent  - 1 x daily - 7 x weekly - 3 sets - 10 reps - Standing Isometric Shoulder Internal Rotation with Towel Roll at Doorway  - 1 x daily - 7 x weekly - 3 sets - 10 reps - Standing Isometric Shoulder External Rotation with Doorway and Towel Roll  - 1 x daily - 7 x weekly - 3 sets - 10 reps - Standing Isometric Shoulder Abduction with Doorway - Arm Bent  - 1 x daily - 7 x weekly - 3 sets - 10 reps - Standing Isometric Shoulder Extension with Doorway - Arm Bent  - 1 x daily - 7 x weekly - 3 sets - 10 reps  ASSESSMENT:  CLINICAL IMPRESSION:  Pain with all ROM both actively and passively but really tried to get pt moving and she was willing to try . Guarded with AA and Pass  ROM.  OBJECTIVE IMPAIRMENTS: difficulty walking, decreased ROM, decreased strength, impaired UE functional use, improper body mechanics, and pain.   ACTIVITY LIMITATIONS: carrying, lifting, bathing, dressing, reach over head, and hygiene/grooming  PARTICIPATION LIMITATIONS: shopping, occupation, yard work, and church  PERSONAL FACTORS: Age, Behavior pattern, Past/current experiences, and Time since onset of injury/illness/exacerbation are also affecting patient's functional outcome.   REHAB POTENTIAL: Fair chronicity of impairments, multiple surgeries    CLINICAL DECISION MAKING: Evolving/moderate complexity  EVALUATION COMPLEXITY: Low  GOALS: Goals reviewed with patient? Yes  SHORT TERM GOALS: Target date: 03/12/24  Patient will be independent with initial HEP.  Baseline: given 01/30/24 Goal status: 02/14/24 MET   LONG TERM GOALS: Target date: 04/23/24  Patient will be independent with advanced/ongoing HEP to improve outcomes and carryover.  Baseline:  Goal status: INITIAL  2.  Patient will report 75% improvement in R shoulder pain to improve QOL. (3/10 pain or better) Baseline: 7/10 Goal status: INITIAL  3.  Patient to improve R shoulder AROM to [facility] without pain provocation to allow for increased ease of ADLs.  Baseline: see chart Goal status: INITIAL  4.  Patient will demonstrate improved functional UE strength as demonstrated by 4/5. Baseline: 2-flex/abd, 2+ER/IR Goal status: INITIAL   PLAN:  PT FREQUENCY: 2x/week  PT DURATION: 12 weeks  PLANNED INTERVENTIONS: 97110-Therapeutic exercises, 97530- Therapeutic activity, 97112- Neuromuscular re-education, 97535- Self Care, 82956- Manual therapy, [id number]- Electrical stimulation (unattended), 97016- Vasopneumatic device, 97033- Ionotophoresis 4mg /ml Dexamethasone, Patient/Family education, Taping, Dry Needling, Joint mobilization, Joint manipulation, Spinal manipulation, Spinal mobilization, Cryotherapy, and Moist  heat  PLAN FOR NEXT SESSION: check goals and ROM Patient Details  Name: SEIDY LABRECK MRN: [id number] Date of Birth: 1966-05-31 Referring Provider:  [name], MD   [place], PTA 02/21/2024, 10:33 AM  [facility] [address] [place], [place], [address] Phone: [phone]   Fax:  [phone]  Patient Details  Name: NEVAE PINNIX MRN: [id number] Date of Birth: 01/25/1966 Referring Provider:  [name], MD  Encounter Date: 02/21/2024   Suanne Marker, PTA 02/21/2024, 10:33 AM  [facility] [address]  [place], [place], [address] Phone: [phone]   Fax:  [phone]

## 2024-02-22 ENCOUNTER — Ambulatory Visit: Admitting: Physical Therapy

## 2024-02-23 ENCOUNTER — Ambulatory Visit: Admitting: Physical Therapy

## 2024-02-23 DIAGNOSIS — G8929 Other chronic pain: Secondary | ICD-10-CM

## 2024-02-23 DIAGNOSIS — M25511 Pain in right shoulder: Secondary | ICD-10-CM | POA: Diagnosis not present

## 2024-02-23 DIAGNOSIS — M25611 Stiffness of right shoulder, not elsewhere classified: Secondary | ICD-10-CM

## 2024-02-23 DIAGNOSIS — M6281 Muscle weakness (generalized): Secondary | ICD-10-CM

## 2024-02-23 NOTE — Therapy (Signed)
 OUTPATIENT PHYSICAL THERAPY SHOULDER TREATMENT    Patient Name: Meagan Flores MRN: [id number] DOB:1966/10/07, 58 y.o., female Today's Date: 02/23/2024  END OF SESSION:  PT End of Session - 02/23/24 1344     Visit Number 6    Date for PT Re-Evaluation 04/23/24    Authorization Type [employer]    PT Start Time 1345    PT Stop Time 1425    PT Time Calculation (min) 40 min              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   Prediabetes    Preop cardiovascular exam 11/25/2020   Pulmonary hypertension, unspecified (HCC) 11/25/2020   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   Rotator cuff impingement syndrome of right shoulder 09/30/2020   Skin rash 02/17/2021   Stroke [facility])    Tendinopathy of right biceps tendon 09/30/2020   Thyroid disease    TIA (transient ischemic attack) 04/15/2018   Tobacco use 09/28/2016   Past Surgical History:  Procedure Laterality Date   BICEPT TENODESIS Right 12/16/2021   Procedure: RIGHT BICEPS TENODESIS;  Surgeon: Tarry Kos, MD;  Location: [facility];  Service: Orthopedics;  Laterality: Right;   PARTIAL HYSTERECTOMY     right arm sx     SHOULDER ARTHROSCOPY WITH BICEPSTENOTOMY Right 01/21/2021   Procedure: SHOULDER ARTHROSCOPY WITH BICEPSTENOTOMY;  Surgeon: Tarry Kos, MD;  Location: [facility];  Service: Orthopedics;  Laterality: Right;   SHOULDER ARTHROSCOPY WITH DISTAL CLAVICLE  RESECTION Right 01/12/2017   Procedure: RIGHT SHOULDER ARTHROSCOPY WITH  SUBACROMIAL DECOMPRESSION, DISTAL CLAVICLE EXCISION, extensive debridement;  Surgeon: Tarry Kos, MD;  Location: 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   Grief at loss of child 03/23/2023   Chronic right shoulder pain 07/01/2022   BMI 33.0-33.9,adult 05/31/2022   Biceps tendonosis of right shoulder 02/10/2022   S/P arthroscopy of right shoulder 02/10/2022   Eczema 02/17/2021   Pulmonary hypertension, unspecified (HCC) 11/25/2020   Prediabetes    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) tear of right shoulder 09/30/2020   Rotator cuff impingement syndrome of right shoulder 09/30/2020   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   Tendinopathy of right biceps tendon 09/30/2020   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   Aneurysm (HCC) 03/18/2020   History of TIA (transient ischemic attack) 02/14/2020   History of stroke    Mixed hyperlipidemia    Essential hypertension    Impingement syndrome of right shoulder 12/24/2016   Tobacco use 09/28/2016   Glenohumeral arthritis 12/27/2014   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    PCP: [name]  REFERRING PROVIDER: [name]  REFERRING DIAG:  M75.41 (ICD-10-CM) - Impingement syndrome of right shoulder    THERAPY DIAG:  Chronic right shoulder pain  Stiffness of right shoulder,  not elsewhere classified  Muscle weakness (generalized)  Rationale for Evaluation and Treatment: Rehabilitation  ONSET DATE: ~5 years  SUBJECTIVE:                                                                                                                                                                                      SUBJECTIVE STATEMENT  it hurts,  very painful after last session. Frustrated with hurting, not working and feeling like I am juts passed around   Hand dominance: Right  PERTINENT HISTORY: TIA 2019 and 2021, HTN, hx of stroke, SLAP lesion 2021, ACJ arthrosis  R bicep tenodesis 2023 Shoulder arthroscopy 2022  58 yo female with ~5 yr hx of Right shoulder pain , underwent Right distal clavicular resection per Dr [name] in 2018, RIght biceps tenotomy and labral repair in 2022, Biceps tenodesis in 2023.  Pt had PT after each surgery.  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 Yes: NPRS scale: 7/10 Pain location: stays in R shoulder  Pain description: sharp pain Aggravating factors: lifting it up, using it Relieving factors: nothing- I try ice,heat,and pain pills but it does not help   PRECAUTIONS: None  RED FLAGS: None   WEIGHT BEARING RESTRICTIONS: No  FALLS:  Has patient fallen in last 6 months? Yes. Number of falls 2  LIVING ENVIRONMENT: Lives with: lives with their spouse Lives in: House/apartment Stairs: No  OCCUPATION: Not working   PLOF: Independent and Independent with basic ADLs  PATIENT GOALS:I want to be able to use my arm   NEXT MD VISIT:   OBJECTIVE:  Note: Objective measures were completed at Evaluation unless otherwise noted.   COGNITION: Overall cognitive status: Within functional limits for tasks assessed     SENSATION: WFL  POSTURE: Rounded shoulders  UPPER EXTREMITY ROM:   Active ROM Right eval Left eval RT standing 02/14/24 RT standing 02/23/24  Shoulder flexion 95 w/pain  95 105  Shoulder extension      Shoulder abduction 55 w/pain  55 68  Shoulder adduction      Shoulder internal rotation Unable to measure d/t pain  Arm at side 75 Arm at side 75  Shoulder external rotation Unable to measure d/t pain  Arm at  side 45  Arm at side 50  Elbow flexion      Elbow extension      Wrist flexion      Wrist extension      Wrist ulnar deviation  Wrist radial deviation      Wrist pronation      Wrist supination      (Blank rows = not tested)  UPPER EXTREMITY MMT:  MMT Right eval Left eval RT 02/23/24   Shoulder flexion 2-  2  Shoulder extension     Shoulder abduction 2-  2  Shoulder adduction     Shoulder internal rotation 2+  2+  Shoulder external rotation 2+  2+  Middle trapezius     Lower trapezius     Elbow flexion     Elbow extension     Wrist flexion     Wrist extension     Wrist ulnar deviation     Wrist radial deviation     Wrist pronation     Wrist supination     Grip strength (lbs)     (Blank rows = not tested)  SHOULDER SPECIAL TESTS: Impingement tests: Neer impingement test: positive , Hawkins/Kennedy impingement test: positive , and Painful arc test: positive  Rotator cuff assessment: Drop arm test: positive  and Empty can test: positive  Biceps assessment: Speed's test: positive   JOINT MOBILITY TESTING:  Very minimal movements, patient is super guarded and pain prevents movement   PALPATION:  TTP around R shoulder                                                                                                                              TREATMENT DATE:   02/23/24 UBE 2 min fwd /2 min backward L 2 ROM and MMT checked and documented above Goals checked and documented below Finger ladder flex and abd 5 x each 2# cane shld ext and IR 10x each 2# cane shld row ,chest press and bicep 10x each Yellow tband shld ext and row 10x Supine 2# cane chest press, flex and ER PROM/stretch  of R shoulder to tolerance all directions with joint oscillations in between reps .   02/21/24 Seated pulleys flex,abd and ER 10 x with some assist Standing shld ext and IR UBE 2 min each way L 1 Rolling ball on ball fwd, back and laterally, CW and CCW 10 x each Row and shld ext  yellow tband 2 sets 10 Nustep standing L 1 push and pull then horz abd 2 min PROM/stretch  of R shoulder to tolerance all directions with joint oscillations in between reps .    02/17/24 AAROM shoulder Flex, Ext, IR x5 each Rows & Ext yellow 2x10 PROM/stretch  of R shoulder to tolerance all directions with joint oscillations in between reps . 02/14/24 PROM/stretch  of R shoulder to tolerance all directions with joint oscillations in between reps . Neural tension stretching RT UE + Self NT stretching on wall -issued to do at home 2# rotation behind back 5x 2# cane shld ext and IR 8 x each 2# cane shld row and bicep 8x each 3# shrugs, shld rolls backward, shld ext,scap squeeze  AROM taken - pain with all motions   02/08/24  PROM/stretch  of R shoulder to tolerance all directions with joint oscillations in between reps   Ionto 4 hour patch anterior R shoulder 1.5mL with appropriate education, wear time, precautions       EVAL 01/30/24   PATIENT EDUCATION: Education details: POC and HEP, impingement syndrome and adhesive capsulitis anatomy  Person educated: Patient Education method: Explanation Education comprehension: verbalized understanding  HOME EXERCISE PROGRAM: Access Code: [email or web address] URL: [email or web address] Date: 01/30/2024 Prepared by: Cassie Freer  Exercises - Seated Shoulder Flexion Towel Slide at Table Top  - 1 x daily - 7 x weekly - 3 sets - 10 reps - Seated Shoulder Abduction Towel Slide at Table Top  - 1 x daily - 7 x weekly - 3 sets - 10 reps - Seated Elbow Extension and Shoulder External Rotation AAROM at Table with Towel  - 1 x daily - 7 x weekly - 3 sets - 10 reps - Seated Elbow Flexion Shoulder Internal Rotation AAROM at Table with Towel  - 1 x daily - 7 x weekly - 3 sets - 10 reps - Standing Isometric Shoulder Flexion with Doorway - Arm Bent  - 1 x daily - 7 x weekly - 3 sets - 10 reps - Standing Isometric Shoulder Internal Rotation  with Towel Roll at Doorway  - 1 x daily - 7 x weekly - 3 sets - 10 reps - Standing Isometric Shoulder External Rotation with Doorway and Towel Roll  - 1 x daily - 7 x weekly - 3 sets - 10 reps - Standing Isometric Shoulder Abduction with Doorway - Arm Bent  - 1 x daily - 7 x weekly - 3 sets - 10 reps - Standing Isometric Shoulder Extension with Doorway - Arm Bent  - 1 x daily - 7 x weekly - 3 sets - 10 reps  ASSESSMENT:  CLINICAL IMPRESSION:  Pain with all ROM both actively and passively but really tried to get pt moving and she was willing to try, pt is motivated to try and very frustrated with pain and feeling passed around without answers. Guarded with AA and Pass ROM. Assessed ROM/MMT and goals  OBJECTIVE IMPAIRMENTS: difficulty walking, decreased ROM, decreased strength, impaired UE functional use, improper body mechanics, and pain.   ACTIVITY LIMITATIONS: carrying, lifting, bathing, dressing, reach over head, and hygiene/grooming  PARTICIPATION LIMITATIONS: shopping, occupation, yard work, and church  PERSONAL FACTORS: Age, Behavior pattern, Past/current experiences, and Time since onset of injury/illness/exacerbation are also affecting patient's functional outcome.   REHAB POTENTIAL: Fair chronicity of impairments, multiple surgeries    CLINICAL DECISION MAKING: Evolving/moderate complexity  EVALUATION COMPLEXITY: Low  GOALS: Goals reviewed with patient? Yes  SHORT TERM GOALS: Target date: 03/12/24  Patient will be independent with initial HEP.  Baseline: given 01/30/24 Goal status: 02/14/24 MET   LONG TERM GOALS: Target date: 04/23/24  Patient will be independent with advanced/ongoing HEP to improve outcomes and carryover.  Baseline:  Goal status: INITIAL  2.  Patient will report 75% improvement in R shoulder pain to improve QOL. (3/10 pain or better) Baseline: 7/10 Goal status: INITIAL  3.  Patient to improve R shoulder AROM to [facility] without pain provocation to allow for  increased ease of ADLs.  Baseline: see chart Goal status: 02/23/24 progressing  4.  Patient will demonstrate improved functional UE strength as demonstrated by 4/5. Baseline: 2-flex/abd, 2+ER/IR Goal status: 02/23/24 progressing   PLAN:  PT FREQUENCY: 2x/week  PT  DURATION: 12 weeks  PLANNED INTERVENTIONS: 97110-Therapeutic exercises, 97530- Therapeutic activity, 97112- Neuromuscular re-education, 97535- Self Care, 16109- Manual therapy, [id number]- Electrical stimulation (unattended), 97016- Vasopneumatic device, [phone]- Ionotophoresis 4mg /ml Dexamethasone, Patient/Family education, Taping, Dry Needling, Joint mobilization, Joint manipulation, Spinal manipulation, Spinal mobilization, Cryotherapy, and Moist heat  PLAN FOR NEXT SESSION: progress as tolerates Patient Details  Name: Meagan Flores MRN: [id number] Date of Birth: 03-Mar-1966 Referring Provider:  [name], MD   [place], PTA 02/23/2024, 1:45 PM  [place] [facility] at [facility] [address] [facility] [place], [place], [address] Phone: [phone]   Fax:  [phone]  Patient Details  Name: Meagan Flores MRN: [id number] Date of Birth: 04/18/1966 Referring Provider:  [name], MD  Encounter Date: 02/23/2024   Suanne Marker, PTA 02/23/2024, 1:45 PM  [place] [facility] at [facility] [address] [facility] [place], [place], [address] Phone: [phone]   Fax:  [phone] at [facility] [address] [place], [place], [address] Phone: [phone]   Fax:  [phone]

## 2024-02-24 ENCOUNTER — Ambulatory Visit

## 2024-02-28 ENCOUNTER — Ambulatory Visit: Attending: Family Medicine | Admitting: Physical Therapy

## 2024-02-28 DIAGNOSIS — R600 Localized edema: Secondary | ICD-10-CM | POA: Insufficient documentation

## 2024-02-28 DIAGNOSIS — M6281 Muscle weakness (generalized): Secondary | ICD-10-CM | POA: Diagnosis present

## 2024-02-28 DIAGNOSIS — G8929 Other chronic pain: Secondary | ICD-10-CM | POA: Insufficient documentation

## 2024-02-28 DIAGNOSIS — M25511 Pain in right shoulder: Secondary | ICD-10-CM | POA: Insufficient documentation

## 2024-02-28 DIAGNOSIS — M25611 Stiffness of right shoulder, not elsewhere classified: Secondary | ICD-10-CM | POA: Diagnosis present

## 2024-02-28 NOTE — Therapy (Signed)
 OUTPATIENT PHYSICAL THERAPY SHOULDER TREATMENT    Patient Name: Meagan Flores MRN: [id number] DOB:[birth year], 58 y.o., female Today's Date: 02/28/2024  END OF SESSION:  PT End of Session - 02/28/24 0929     Visit Number 7    Date for PT Re-Evaluation 04/23/24    Authorization Type [employer]    PT Start Time 0925    PT Stop Time 1005    PT Time Calculation (min) 40 min              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   Prediabetes    Preop cardiovascular exam 11/25/2020   Pulmonary hypertension, unspecified (HCC) 11/25/2020   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   Rotator cuff impingement syndrome of right shoulder 09/30/2020   Skin rash 02/17/2021   Stroke [facility])    Tendinopathy of right biceps tendon 09/30/2020   Thyroid disease    TIA (transient ischemic attack) 04/15/2018   Tobacco use 09/28/2016   Past Surgical History:  Procedure Laterality Date   BICEPT TENODESIS Right 12/16/2021   Procedure: RIGHT BICEPS TENODESIS;  Surgeon: Tarry Kos, MD;  Location: [facility];  Service: Orthopedics;  Laterality: Right;   PARTIAL HYSTERECTOMY     right arm sx     SHOULDER ARTHROSCOPY WITH BICEPSTENOTOMY Right 01/21/2021   Procedure: SHOULDER ARTHROSCOPY WITH BICEPSTENOTOMY;  Surgeon: Tarry Kos, MD;  Location: [facility];  Service: Orthopedics;  Laterality: Right;   SHOULDER ARTHROSCOPY WITH DISTAL CLAVICLE  RESECTION Right 01/12/2017   Procedure: RIGHT SHOULDER ARTHROSCOPY WITH  SUBACROMIAL DECOMPRESSION, DISTAL CLAVICLE EXCISION, extensive debridement;  Surgeon: Tarry Kos, MD;  Location: 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   Grief at loss of child 03/23/2023   Chronic right shoulder pain 07/01/2022   BMI 33.0-33.9,adult 05/31/2022   Biceps tendonosis of right shoulder 02/10/2022   S/P arthroscopy of right shoulder 02/10/2022   Eczema 02/17/2021   Pulmonary hypertension, unspecified (HCC) 11/25/2020   Prediabetes    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) tear of right shoulder 09/30/2020   Rotator cuff impingement syndrome of right shoulder 09/30/2020   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   Tendinopathy of right biceps tendon 09/30/2020   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   Aneurysm (HCC) 03/18/2020   History of TIA (transient ischemic attack) 02/14/2020   History of stroke    Mixed hyperlipidemia    Essential hypertension    Impingement syndrome of right shoulder 12/24/2016   Tobacco use 09/28/2016   Glenohumeral arthritis 12/27/2014   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    PCP: [name]  REFERRING PROVIDER: [name]  REFERRING DIAG:  M75.41 (ICD-10-CM) - Impingement syndrome of right shoulder    THERAPY DIAG:  Chronic right shoulder pain  Stiffness of right shoulder,  not elsewhere classified  Muscle weakness (generalized)  Localized edema  Rationale for Evaluation and Treatment: Rehabilitation  ONSET DATE: ~5 years  SUBJECTIVE:                                                                                                                                                                                      SUBJECTIVE  STATEMENT    Doing okay today  Hand dominance: Right  PERTINENT HISTORY: TIA 2019 and 2021, HTN, hx of stroke, SLAP lesion 2021, ACJ arthrosis  R bicep tenodesis 2023 Shoulder arthroscopy 2022  58 yo female with ~5 yr hx of Right shoulder pain , underwent Right distal clavicular resection per Dr [name] in 2018, RIght biceps tenotomy and labral repair in 2022, Biceps tenodesis in 2023.  Pt had PT after each surgery.  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 Yes: NPRS scale: 7/10 Pain location: stays in R shoulder  Pain description: sharp pain Aggravating factors: lifting it up, using it Relieving factors: nothing- I try ice,heat,and pain pills but it does not help   PRECAUTIONS: None  RED FLAGS: None   WEIGHT BEARING RESTRICTIONS: No  FALLS:  Has patient fallen in last 6 months? Yes. Number of falls 2  LIVING ENVIRONMENT: Lives with: lives with their spouse Lives in: House/apartment Stairs: No  OCCUPATION: Not working   PLOF: Independent and Independent with basic ADLs  PATIENT GOALS:I want to be able to use my arm   NEXT MD VISIT:   OBJECTIVE:  Note: Objective measures were completed at Evaluation unless otherwise noted.   COGNITION: Overall cognitive status: Within functional limits for tasks assessed     SENSATION: WFL  POSTURE: Rounded shoulders  UPPER EXTREMITY ROM:   Active ROM Right eval Left eval RT standing 02/14/24 RT standing 02/23/24  Shoulder flexion 95 w/pain  95 105  Shoulder extension      Shoulder abduction 55 w/pain  55 68  Shoulder adduction      Shoulder internal rotation Unable to measure d/t pain  Arm at side 75 Arm at side 75  Shoulder external rotation Unable to measure d/t pain  Arm at side 45  Arm at side 50  Elbow flexion      Elbow extension      Wrist flexion       Wrist extension      Wrist ulnar deviation      Wrist radial deviation      Wrist pronation  Wrist supination      (Blank rows = not tested)  UPPER EXTREMITY MMT:  MMT Right eval Left eval RT 02/23/24   Shoulder flexion 2-  2  Shoulder extension     Shoulder abduction 2-  2  Shoulder adduction     Shoulder internal rotation 2+  2+  Shoulder external rotation 2+  2+  Middle trapezius     Lower trapezius     Elbow flexion     Elbow extension     Wrist flexion     Wrist extension     Wrist ulnar deviation     Wrist radial deviation     Wrist pronation     Wrist supination     Grip strength (lbs)     (Blank rows = not tested)  SHOULDER SPECIAL TESTS: Impingement tests: Neer impingement test: positive , Hawkins/Kennedy impingement test: positive , and Painful arc test: positive  Rotator cuff assessment: Drop arm test: positive  and Empty can test: positive  Biceps assessment: Speed's test: positive   JOINT MOBILITY TESTING:  Very minimal movements, patient is super guarded and pain prevents movement   PALPATION:  TTP around R shoulder                                                                                                                              TREATMENT DATE:   02/28/24 UBE L 2 3 min fwd/ 3 min back Cable pulley row 2 sets 8 with postural cuing needed Tricep ext 15# 2 sets 10 Bicep Curl 5# 10 x 1# cabinet reaching 10 x flex and abd Trampoline wt ball drop and catch then supine/pronation catch 1# func reaching 10 x Yellow tband IR and ER 2 sets 10 Yellow tband shld ext 10 x 2# chest press 10 x 2# diagonals and circles 10 x each way  AA PROM/stretch  of R shoulder to tolerance all directions with joint oscillations in between reps .    02/23/24 UBE 2 min fwd /2 min backward L 2 ROM and MMT checked and documented above Goals checked and documented below Finger ladder flex and abd 5 x each 2# cane shld ext and IR 10x each 2# cane shld row  ,chest press and bicep 10x each Yellow tband shld ext and row 10x Supine 2# cane chest press, flex and ER PROM/stretch  of R shoulder to tolerance all directions with joint oscillations in between reps .   02/21/24 Seated pulleys flex,abd and ER 10 x with some assist Standing shld ext and IR UBE 2 min each way L 1 Rolling ball on ball fwd, back and laterally, CW and CCW 10 x each Row and shld ext yellow tband 2 sets 10 Nustep standing L 1 push and pull then horz abd 2 min PROM/stretch  of R shoulder to tolerance all directions with joint oscillations in between reps .    02/17/24 AAROM shoulder Flex, Ext, IR  x5 each Rows & Ext yellow 2x10 PROM/stretch  of R shoulder to tolerance all directions with joint oscillations in between reps . 02/14/24 PROM/stretch  of R shoulder to tolerance all directions with joint oscillations in between reps . Neural tension stretching RT UE + Self NT stretching on wall -issued to do at home 2# rotation behind back 5x 2# cane shld ext and IR 8 x each 2# cane shld row and bicep 8x each 3# shrugs, shld rolls backward, shld ext,scap squeeze AROM taken - pain with all motions   02/08/24  PROM/stretch  of R shoulder to tolerance all directions with joint oscillations in between reps   Ionto 4 hour patch anterior R shoulder 1.45mL with appropriate education, wear time, precautions       EVAL 01/30/24   PATIENT EDUCATION: Education details: POC and HEP, impingement syndrome and adhesive capsulitis anatomy  Person educated: Patient Education method: Explanation Education comprehension: verbalized understanding  HOME EXERCISE PROGRAM: Access Code: [email or web address] URL: [email or web address] Date: 01/30/2024 Prepared by: Cassie Freer  Exercises - Seated Shoulder Flexion Towel Slide at Table Top  - 1 x daily - 7 x weekly - 3 sets - 10 reps - Seated Shoulder Abduction Towel Slide at Table Top  - 1 x daily - 7 x weekly - 3 sets - 10  reps - Seated Elbow Extension and Shoulder External Rotation AAROM at Table with Towel  - 1 x daily - 7 x weekly - 3 sets - 10 reps - Seated Elbow Flexion Shoulder Internal Rotation AAROM at Table with Towel  - 1 x daily - 7 x weekly - 3 sets - 10 reps - Standing Isometric Shoulder Flexion with Doorway - Arm Bent  - 1 x daily - 7 x weekly - 3 sets - 10 reps - Standing Isometric Shoulder Internal Rotation with Towel Roll at Doorway  - 1 x daily - 7 x weekly - 3 sets - 10 reps - Standing Isometric Shoulder External Rotation with Doorway and Towel Roll  - 1 x daily - 7 x weekly - 3 sets - 10 reps - Standing Isometric Shoulder Abduction with Doorway - Arm Bent  - 1 x daily - 7 x weekly - 3 sets - 10 reps - Standing Isometric Shoulder Extension with Doorway - Arm Bent  - 1 x daily - 7 x weekly - 3 sets - 10 reps  ASSESSMENT:  CLINICAL IMPRESSION:  progressed ex , pain limited but tolerated increased ex. With better mvmt .Verb and tactile cuing needed  OBJECTIVE IMPAIRMENTS: difficulty walking, decreased ROM, decreased strength, impaired UE functional use, improper body mechanics, and pain.   ACTIVITY LIMITATIONS: carrying, lifting, bathing, dressing, reach over head, and hygiene/grooming  PARTICIPATION LIMITATIONS: shopping, occupation, yard work, and church  PERSONAL FACTORS: Age, Behavior pattern, Past/current experiences, and Time since onset of injury/illness/exacerbation are also affecting patient's functional outcome.   REHAB POTENTIAL: Fair chronicity of impairments, multiple surgeries    CLINICAL DECISION MAKING: Evolving/moderate complexity  EVALUATION COMPLEXITY: Low  GOALS: Goals reviewed with patient? Yes  SHORT TERM GOALS: Target date: 03/12/24  Patient will be independent with initial HEP.  Baseline: given 01/30/24 Goal status: 02/14/24 MET   LONG TERM GOALS: Target date: 04/23/24  Patient will be independent with advanced/ongoing HEP to improve outcomes and carryover.   Baseline:  Goal status: INITIAL  2.  Patient will report 75% improvement in R shoulder pain to improve QOL. (3/10 pain or better) Baseline: 7/10 Goal status: INITIAL  3.  Patient to improve R shoulder AROM to [facility] without pain provocation to allow for increased ease of ADLs.  Baseline: see chart Goal status: 02/23/24 progressing  4.  Patient will demonstrate improved functional UE strength as demonstrated by 4/5. Baseline: 2-flex/abd, 2+ER/IR Goal status: 02/23/24 progressing   PLAN:  PT FREQUENCY: 2x/week  PT DURATION: 12 weeks  PLANNED INTERVENTIONS: 97110-Therapeutic exercises, 97530- Therapeutic activity, 97112- Neuromuscular re-education, 97535- Self Care, 16109- Manual therapy, G0283- Electrical stimulation (unattended), 97016- Vasopneumatic device, 97033- Ionotophoresis 4mg /ml Dexamethasone, Patient/Family education, Taping, Dry Needling, Joint mobilization, Joint manipulation, Spinal manipulation, Spinal mobilization, Cryotherapy, and Moist heat  PLAN FOR NEXT SESSION: progress as tolerates Patient Details  Name: Meagan Flores MRN: [id number] Date of Birth: 1966/07/04 Referring Provider:  [name], MD   [place], PTA 02/28/2024, 9:31 AM  [facility] [address] [place], [place], [address] Phone: [phone]   Fax:  [phone]  Patient Details  Name: Meagan Flores MRN: [id number] Date of Birth: [birth year] Referring Provider:  [name], MD  Encounter Date: 02/28/2024   Suanne Marker, PTA 02/28/2024, 9:31 AM  [facility] [address] [place], [place], [address] Phone: [phone]   Fax:  [phone] at [facility] [address]. [place], [place], [address] Phone: [phone]   Fax:  [phone] [address] [place], [place], [address] Phone: [phone]   Fax:  [phone]  Patient Details  Name: Meagan Flores MRN: [id number] Date of Birth: [birth year] Referring Provider:  [name], MD  Encounter Date: 02/28/2024   Suanne Marker, PTA 02/28/2024, 9:31 AM  [place] [address] [place], [place], [address] Phone: [phone]   Fax:  [phone]

## 2024-02-29 ENCOUNTER — Ambulatory Visit

## 2024-03-01 ENCOUNTER — Ambulatory Visit

## 2024-03-02 ENCOUNTER — Ambulatory Visit

## 2024-03-06 ENCOUNTER — Ambulatory Visit: Admitting: Physical Therapy

## 2024-03-06 DIAGNOSIS — M25511 Pain in right shoulder: Secondary | ICD-10-CM | POA: Diagnosis not present

## 2024-03-06 DIAGNOSIS — M6281 Muscle weakness (generalized): Secondary | ICD-10-CM

## 2024-03-06 DIAGNOSIS — M25611 Stiffness of right shoulder, not elsewhere classified: Secondary | ICD-10-CM

## 2024-03-06 DIAGNOSIS — G8929 Other chronic pain: Secondary | ICD-10-CM

## 2024-03-06 NOTE — Therapy (Signed)
 OUTPATIENT PHYSICAL THERAPY SHOULDER TREATMENT    Patient Name: Meagan Flores MRN: [id number] DOB:[birth year], 58 y.o., female Today's Date: 03/06/2024  END OF SESSION:  PT End of Session - 03/06/24 0932     Visit Number 8    Date for PT Re-Evaluation 04/23/24    Authorization Type [employer]    PT Start Time 0930    PT Stop Time 1010    PT Time Calculation (min) 40 min              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   Prediabetes    Preop cardiovascular exam 11/25/2020   Pulmonary hypertension, unspecified (HCC) 11/25/2020   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   Rotator cuff impingement syndrome of right shoulder 09/30/2020   Skin rash 02/17/2021   Stroke [facility])    Tendinopathy of right biceps tendon 09/30/2020   Thyroid disease    TIA (transient ischemic attack) 04/15/2018   Tobacco use 09/28/2016   Past Surgical History:  Procedure Laterality Date   BICEPT TENODESIS Right 12/16/2021   Procedure: RIGHT BICEPS TENODESIS;  Surgeon: Tarry Kos, MD;  Location: [facility];  Service: Orthopedics;  Laterality: Right;   PARTIAL HYSTERECTOMY     right arm sx     SHOULDER ARTHROSCOPY WITH BICEPSTENOTOMY Right 01/21/2021   Procedure: SHOULDER ARTHROSCOPY WITH BICEPSTENOTOMY;  Surgeon: Tarry Kos, MD;  Location: [facility];  Service: Orthopedics;  Laterality: Right;   SHOULDER ARTHROSCOPY WITH DISTAL CLAVICLE  RESECTION Right 01/12/2017   Procedure: RIGHT SHOULDER ARTHROSCOPY WITH  SUBACROMIAL DECOMPRESSION, DISTAL CLAVICLE EXCISION, extensive debridement;  Surgeon: Tarry Kos, MD;  Location: 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   Grief at loss of child 03/23/2023   Chronic right shoulder pain 07/01/2022   BMI 33.0-33.9,adult 05/31/2022   Biceps tendonosis of right shoulder 02/10/2022   S/P arthroscopy of right shoulder 02/10/2022   Eczema 02/17/2021   Pulmonary hypertension, unspecified (HCC) 11/25/2020   Prediabetes    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) tear of right shoulder 09/30/2020   Rotator cuff impingement syndrome of right shoulder 09/30/2020   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   Tendinopathy of right biceps tendon 09/30/2020   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   Aneurysm (HCC) 03/18/2020   History of TIA (transient ischemic attack) 02/14/2020   History of stroke    Mixed hyperlipidemia    Essential hypertension    Impingement syndrome of right shoulder 12/24/2016   Tobacco use 09/28/2016   Glenohumeral arthritis 12/27/2014   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    PCP: [name]  REFERRING PROVIDER: [name]  REFERRING DIAG:  M75.41 (ICD-10-CM) - Impingement syndrome of right shoulder    THERAPY DIAG:  Chronic right shoulder pain  Stiffness of right shoulder,  not elsewhere classified  Muscle weakness (generalized)  Rationale for Evaluation and Treatment: Rehabilitation  ONSET DATE: ~5 years  SUBJECTIVE:                                                                                                                                                                                      SUBJECTIVE STATEMENT      Missed  last appt, so depressed got autopsy on my son I really don't see any difference in my shld Could we decrease to 1 x a week    Hand dominance: Right  PERTINENT HISTORY: TIA 2019 and 2021, HTN, hx of stroke, SLAP lesion 2021, ACJ arthrosis  R bicep tenodesis 2023 Shoulder arthroscopy 2022  58 yo female with ~5 yr hx of Right shoulder pain , underwent Right distal clavicular resection per Dr [name] in 2018, RIght biceps tenotomy and labral repair in 2022, Biceps tenodesis in 2023.  Pt had PT after each surgery.  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 Yes: NPRS scale: 7/10 Pain location: stays in R shoulder  Pain description: sharp pain Aggravating factors: lifting it up, using it Relieving factors: nothing- I try ice,heat,and pain pills but it does not help   PRECAUTIONS: None  RED FLAGS: None   WEIGHT BEARING RESTRICTIONS: No  FALLS:  Has patient fallen in last 6 months? Yes. Number of falls 2  LIVING ENVIRONMENT: Lives with: lives with their spouse Lives in: House/apartment Stairs: No  OCCUPATION: Not working   PLOF: Independent and Independent with basic ADLs  PATIENT GOALS:I want to be able to use my arm   NEXT MD VISIT:   OBJECTIVE:  Note: Objective measures were completed at Evaluation unless otherwise noted.   COGNITION: Overall cognitive status: Within functional limits for tasks assessed     SENSATION: WFL  POSTURE: Rounded shoulders  UPPER EXTREMITY ROM:   Active ROM Right eval Left eval RT standing 02/14/24 RT standing 02/23/24 Rt standing 03/06/24  Shoulder flexion 95 w/pain  95 105 95 pain  Shoulder extension       Shoulder abduction 55 w/pain  55 68 65 pain  Shoulder adduction       Shoulder internal rotation Unable to measure d/t pain  Arm at side 75 Arm at side 75   Shoulder external  rotation Unable to measure d/t pain  Arm at side 45  Arm at side 50   Elbow flexion       Elbow extension  Wrist flexion       Wrist extension       Wrist ulnar deviation       Wrist radial deviation       Wrist pronation       Wrist supination       (Blank rows = not tested)  UPPER EXTREMITY MMT:  MMT Right eval Left eval RT 02/23/24   Shoulder flexion 2-  2  Shoulder extension     Shoulder abduction 2-  2  Shoulder adduction     Shoulder internal rotation 2+  2+  Shoulder external rotation 2+  2+  Middle trapezius     Lower trapezius     Elbow flexion     Elbow extension     Wrist flexion     Wrist extension     Wrist ulnar deviation     Wrist radial deviation     Wrist pronation     Wrist supination     Grip strength (lbs)     (Blank rows = not tested)  SHOULDER SPECIAL TESTS: Impingement tests: Neer impingement test: positive , Hawkins/Kennedy impingement test: positive , and Painful arc test: positive  Rotator cuff assessment: Drop arm test: positive  and Empty can test: positive  Biceps assessment: Speed's test: positive   JOINT MOBILITY TESTING:  Very minimal movements, patient is super guarded and pain prevents movement   PALPATION:  TTP around R shoulder                                                                                                                              TREATMENT DATE:   03/06/24 UBE L 2 3 min fwd/ 3 min back PROM RT shld all direction with pain Joint mobs RT shld ROM and goals checked   02/28/24 UBE L 2 3 min fwd/ 3 min back Cable pulley row 2 sets 8 with postural cuing needed Tricep ext 15# 2 sets 10 Bicep Curl 5# 10 x 1# cabinet reaching 10 x flex and abd Trampoline wt ball drop and catch then supine/pronation catch 1# func reaching 10 x Yellow tband IR and ER 2 sets 10 Yellow tband shld ext 10 x 2# chest press 10 x 2# diagonals and circles 10 x each way  AA PROM/stretch  of R shoulder to tolerance all  directions with joint oscillations in between reps .    02/23/24 UBE 2 min fwd /2 min backward L 2 ROM and MMT checked and documented above Goals checked and documented below Finger ladder flex and abd 5 x each 2# cane shld ext and IR 10x each 2# cane shld row ,chest press and bicep 10x each Yellow tband shld ext and row 10x Supine 2# cane chest press, flex and ER PROM/stretch  of R shoulder to tolerance all directions with joint oscillations in between reps .   02/21/24 Seated pulleys flex,abd and ER 10 x with some assist Standing shld ext  and IR UBE 2 min each way L 1 Rolling ball on ball fwd, back and laterally, CW and CCW 10 x each Row and shld ext yellow tband 2 sets 10 Nustep standing L 1 push and pull then horz abd 2 min PROM/stretch  of R shoulder to tolerance all directions with joint oscillations in between reps .    02/17/24 AAROM shoulder Flex, Ext, IR x5 each Rows & Ext yellow 2x10 PROM/stretch  of R shoulder to tolerance all directions with joint oscillations in between reps . 02/14/24 PROM/stretch  of R shoulder to tolerance all directions with joint oscillations in between reps . Neural tension stretching RT UE + Self NT stretching on wall -issued to do at home 2# rotation behind back 5x 2# cane shld ext and IR 8 x each 2# cane shld row and bicep 8x each 3# shrugs, shld rolls backward, shld ext,scap squeeze AROM taken - pain with all motions   02/08/24  PROM/stretch  of R shoulder to tolerance all directions with joint oscillations in between reps   Ionto 4 hour patch anterior R shoulder 1.74mL with appropriate education, wear time, precautions       EVAL 01/30/24   PATIENT EDUCATION: Education details: POC and HEP, impingement syndrome and adhesive capsulitis anatomy  Person educated: Patient Education method: Explanation Education comprehension: verbalized understanding  HOME EXERCISE PROGRAM: Access Code: [email or web address] URL:  [email or web address] Date: 01/30/2024 Prepared by: Cassie Freer  Exercises - Seated Shoulder Flexion Towel Slide at Table Top  - 1 x daily - 7 x weekly - 3 sets - 10 reps - Seated Shoulder Abduction Towel Slide at Table Top  - 1 x daily - 7 x weekly - 3 sets - 10 reps - Seated Elbow Extension and Shoulder External Rotation AAROM at Table with Towel  - 1 x daily - 7 x weekly - 3 sets - 10 reps - Seated Elbow Flexion Shoulder Internal Rotation AAROM at Table with Towel  - 1 x daily - 7 x weekly - 3 sets - 10 reps - Standing Isometric Shoulder Flexion with Doorway - Arm Bent  - 1 x daily - 7 x weekly - 3 sets - 10 reps - Standing Isometric Shoulder Internal Rotation with Towel Roll at Doorway  - 1 x daily - 7 x weekly - 3 sets - 10 reps - Standing Isometric Shoulder External Rotation with Doorway and Towel Roll  - 1 x daily - 7 x weekly - 3 sets - 10 reps - Standing Isometric Shoulder Abduction with Doorway - Arm Bent  - 1 x daily - 7 x weekly - 3 sets - 10 reps - Standing Isometric Shoulder Extension with Doorway - Arm Bent  - 1 x daily - 7 x weekly - 3 sets - 10 reps  ASSESSMENT:  CLINICAL IMPRESSION:  arrived with increased pain and limited tolerance to session. Increased pain and decreased ROM today .Verb and tactile cuing needed. Assessed ROM and goals. MD not until August- discussed calling and getting an earlier appt and let us know and we can write a note for her  OBJECTIVE IMPAIRMENTS: difficulty walking, decreased ROM, decreased strength, impaired UE functional use, improper body mechanics, and pain.   ACTIVITY LIMITATIONS: carrying, lifting, bathing, dressing, reach over head, and hygiene/grooming  PARTICIPATION LIMITATIONS: shopping, occupation, yard work, and church  PERSONAL FACTORS: Age, Behavior pattern, Past/current experiences, and Time since onset of injury/illness/exacerbation are also affecting patient's functional outcome.   REHAB POTENTIAL: Fair chronicity  of impairments, multiple surgeries    CLINICAL DECISION MAKING: Evolving/moderate complexity  EVALUATION COMPLEXITY: Low  GOALS: Goals reviewed with patient? Yes  SHORT TERM GOALS: Target date: 03/12/24  Patient will be independent with initial HEP.  Baseline: given 01/30/24 Goal status: 02/14/24 MET   LONG TERM GOALS: Target date: 04/23/24  Patient will be independent with advanced/ongoing HEP to improve outcomes and carryover.  Baseline:  Goal status: 03/06/24 ongoing  2.  Patient will report 75% improvement in R shoulder pain to improve QOL. (3/10 pain or better) Baseline: 7/10 Goal status: 03/06/24 no chnages  3.  Patient to improve R shoulder AROM to [facility] without pain provocation to allow for increased ease of ADLs.  Baseline: see chart Goal status: 02/23/24 progressing  and 03/06/24  4.  Patient will demonstrate improved functional UE strength as demonstrated by 4/5. Baseline: 2-flex/abd, 2+ER/IR Goal status: 02/23/24 progressing   PLAN:  PT FREQUENCY: 2x/week  PT DURATION: 12 weeks  PLANNED INTERVENTIONS: 97110-Therapeutic exercises, 97530- Therapeutic activity, 97112- Neuromuscular re-education, 97535- Self Care, 40981- Manual therapy, G0283- Electrical stimulation (unattended), 97016- Vasopneumatic device, 97033- Ionotophoresis 4mg /ml Dexamethasone, Patient/Family education, Taping, Dry Needling, Joint mobilization, Joint manipulation, Spinal manipulation, Spinal mobilization, Cryotherapy, and Moist heat  PLAN FOR NEXT SESSION: progress as tolerates. Decrease freq to 1 x a week per pt request Patient Details  Name: SANIKA BROSIOUS MRN: [id number] Date of Birth: [birth year] Referring Provider:  [name], MD   Laura, PTA 03/06/2024, 9:32 AM  [facility] [address] [place], [place], [address] Phone: [phone]   Fax:  [phone]  Patient Details  Name: HANEEN BERNALES MRN: [id number] Date of  Birth: 04/18/1966 Referring Provider:  [name], MD  Encounter Date: 03/06/2024   Suanne Marker, PTA 03/06/2024, 9:32 AM  [facility] [address] [place], [place], [address] Phone: [phone]   Fax:  [phone] at [facility] [address] [place] [place]. [place], [place], [address] Phone: [phone]   Fax:  [phone] at [facility] [address] [place], [place], [address] Phone: [phone]   Fax:  [phone]

## 2024-03-08 ENCOUNTER — Ambulatory Visit: Admitting: Physical Therapy

## 2024-03-13 ENCOUNTER — Ambulatory Visit: Attending: Family Medicine | Admitting: Family Medicine

## 2024-03-13 ENCOUNTER — Encounter: Payer: Self-pay | Admitting: Family Medicine

## 2024-03-13 ENCOUNTER — Telehealth: Payer: Self-pay

## 2024-03-13 VITALS — BP 134/93 | HR 120 | Wt 202.4 lb

## 2024-03-13 DIAGNOSIS — M7501 Adhesive capsulitis of right shoulder: Secondary | ICD-10-CM

## 2024-03-13 DIAGNOSIS — Z634 Disappearance and death of family member: Secondary | ICD-10-CM | POA: Diagnosis not present

## 2024-03-13 DIAGNOSIS — I10 Essential (primary) hypertension: Secondary | ICD-10-CM

## 2024-03-13 DIAGNOSIS — E890 Postprocedural hypothyroidism: Secondary | ICD-10-CM

## 2024-03-13 DIAGNOSIS — R7303 Prediabetes: Secondary | ICD-10-CM

## 2024-03-13 DIAGNOSIS — E782 Mixed hyperlipidemia: Secondary | ICD-10-CM

## 2024-03-13 DIAGNOSIS — F329 Major depressive disorder, single episode, unspecified: Secondary | ICD-10-CM

## 2024-03-13 MED ORDER — GABAPENTIN 300 MG PO CAPS: 300.0000 mg | ORAL_CAPSULE | Freq: Every day | ORAL | 3 refills | Status: DC

## 2024-03-13 NOTE — Patient Instructions (Signed)
 VISIT SUMMARY:  You came in today for a follow-up visit to discuss your ongoing right shoulder pain, depression, anxiety, and other health concerns. We reviewed your current symptoms and medications, and made some adjustments to your treatment plan.  YOUR PLAN:  -RIGHT SHOULDER ADHESIVE CAPSULITIS: Adhesive capsulitis, also known as frozen shoulder, is a condition characterized by stiffness and pain in your shoulder joint. We will start you on gabapentin for nerve pain and continue your current medications, Flexeril and Naproxen. You will also be referred to a new orthopedic specialist for a second opinion.  -MAJOR DEPRESSIVE DISORDER: Major depressive disorder is a mental health condition marked by persistent feelings of sadness and loss of interest. We will consult with a case manager to explore resources for counseling or therapy and discuss potential support groups like [employer].  -HYPOTHYROIDISM: Hypothyroidism is a condition where your thyroid gland doesn't produce enough thyroid hormone. We will order thyroid function tests to reassess your levothyroxine dosage.  -PREDIABETES: Prediabetes is a condition where your blood sugar levels are higher than normal but not high enough to be classified as diabetes. We will order an A1c test to monitor your blood sugar levels.  -HYPERLIPIDEMIA: Hyperlipidemia is a condition where you have high levels of fats (lipids) in your blood. We will plan to perform a fasting lipid panel at a future visit to monitor your cholesterol levels.  INSTRUCTIONS:  Please follow up with the new orthopedic specialist as soon as possible. Schedule an appointment for your thyroid function tests and A1c test. We will also need to plan for a fasting lipid panel at a future visit. Consult with the case manager about counseling resources and consider joining a support group like [employer].

## 2024-03-13 NOTE — Progress Notes (Signed)
 Subjective:  Patient ID: Meagan Flores, female    DOB: 10-24-1966  Age: 58 y.o. MRN: [id number]  CC: Shoulder Pain (Right shoulder, pain when pulling arm up and back )     Discussed the use of AI scribe software for clinical note transcription with the patient, who gave verbal consent to proceed.  History of Present Illness The patient, with a history of  hyperlipidemia, stroke, major depressive disorder, hypothyroidism, hypertension (diet-controlled), pulmonary hypertension, right shoulder adhesive capsulitis (status post previous right shoulder surgeries), nicotine dependence, Prediabetes presents for follow-up.  She reports ongoing right shoulder pain despite physical therapy and three previous surgeries. The pain is described as a 'locking' sensation, preventing her from lifting or extending her arm. She also reports tingling and cramping in her right hand. The patient is currently taking Flexeril for muscle relaxation, Naproxen for pain, and uses a Lidoderm patch.  She is unhappy that despite her previous 3 right shoulder surgeries, right shoulder injections and ongoing PT she has not noticed any relief. She is currently awaiting a disability hearing and has been unsuccessful in securing vocational rehabilitation due to staffing shortages.  The patient also reports ongoing struggles with depression and anxiety, exacerbated by the recent murder of her son who was murdered in March/2024.  She has sought counseling, but has been unable to continue due to insurance issues.  She was placed on trazodone for insomnia and states the Lexapro is helping her depression.    Her last set of labs revealed uncontrolled hyperlipidemia and she endorses working on her diet to reduce her cholesterol intake and she is adherent with her statin. Last TSH was elevated and she remains on levothyroxine which she has been adherent with. Her prediabetes is diet controlled.  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   Prediabetes    Preop cardiovascular exam 11/25/2020   Pulmonary hypertension, unspecified (HCC) 11/25/2020   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   Rotator cuff impingement syndrome of right shoulder 09/30/2020   Skin rash 02/17/2021   Stroke [facility])    Tendinopathy of right biceps tendon 09/30/2020   Thyroid disease    TIA (transient ischemic attack) 04/15/2018   Tobacco use 09/28/2016    Past Surgical History:  Procedure Laterality Date   BICEPT TENODESIS Right 12/16/2021   Procedure: RIGHT BICEPS TENODESIS;  Surgeon: Tarry Kos, MD;  Location:  SURGERY CENTER;  Service: Orthopedics;  Laterality: Right;   PARTIAL HYSTERECTOMY     right arm sx     SHOULDER ARTHROSCOPY WITH BICEPSTENOTOMY Right 01/21/2021   Procedure: SHOULDER ARTHROSCOPY WITH BICEPSTENOTOMY;  Surgeon: Tarry Kos, MD;  Location: [facility];  Service: Orthopedics;  Laterality: Right;   SHOULDER ARTHROSCOPY WITH DISTAL CLAVICLE RESECTION Right 01/12/2017   Procedure: RIGHT SHOULDER ARTHROSCOPY WITH  SUBACROMIAL DECOMPRESSION, DISTAL CLAVICLE EXCISION, extensive debridement;  Surgeon: Tarry Kos, MD;  Location: [facility];  Service: Orthopedics;  Laterality: Right;   THYMECTOMY  2004  THYROIDECTOMY      Family History  Problem Relation Age of Onset   Stomach cancer Sister    Colon polyps Sister    Esophageal cancer Neg Hx    Rectal cancer Neg  Hx    Colon cancer Neg Hx    Breast cancer Neg Hx     Social History   Socioeconomic History   Marital status: Married    Spouse name: Not on file   Number of children: Not on file   Years of education: Not on file   Highest education level: Not on file  Occupational History   Not on file  Tobacco Use   Smoking status: Every Day    Current packs/day: 0.50    Average packs/day: 0.5 packs/day for 34.0 years (17.0 ttl pk-yrs)    Types: Cigarettes   Smokeless tobacco: Never  Vaping Use   Vaping status: Never Used  Substance and Sexual Activity   Alcohol use: Yes    Comment: occassional   Drug use: No   Sexual activity: Not on file  Other Topics Concern   Not on file  Social History Narrative   Not on file   Social Drivers of Health   Financial Resource Strain: Medium Risk (10/11/2023)   Overall Financial Resource Strain (CARDIA)    Difficulty of Paying Living Expenses: Somewhat hard  Food Insecurity: Food Insecurity Present (10/11/2023)   Hunger Vital Sign    Worried About Running Out of Food in the Last Year: Sometimes true    Ran Out of Food in the Last Year: Sometimes true  Transportation Needs: No Transportation Needs (10/11/2023)   PRAPARE - Administrator, Civil Service (Medical): No    Lack of Transportation (Non-Medical): No  Physical Activity: Inactive (10/11/2023)   Exercise Vital Sign    Days of Exercise per Week: 0 days    Minutes of Exercise per Session: 0 min  Stress: Stress Concern Present (10/11/2023)   [employer] of Occupational Health - Occupational Stress Questionnaire    Feeling of Stress : To some extent  Social Connections: Unknown (10/11/2023)   Social Connection and Isolation Panel [NHANES]    Frequency of Communication with Friends and Family: Three times a week    Frequency of Social Gatherings with Friends and Family: Never    Attends Religious Services: Not on Marketing executive or Organizations: No     Attends Engineer, structural: Not on file    Marital Status: Married    Allergies  Allergen Reactions   Sulfa Antibiotics Hives    Outpatient Medications Prior to Visit  Medication Sig Dispense Refill   acetaminophen-codeine (TYLENOL #3) 300-30 MG tablet Take 1 tablet by mouth every 4 (four) hours as needed for moderate pain or severe pain. 60 tablet 1   aspirin EC 81 MG tablet Take 1 tablet (81 mg total) by mouth daily. 60 tablet 3   cyclobenzaprine (FLEXERIL) 10 MG tablet Take 1 tablet (10 mg total) by mouth at bedtime. 90 tablet 1   escitalopram (LEXAPRO) 10 MG tablet Take 1 tablet (10 mg total) by mouth daily. 90 tablet 1   estradiol (ESTRACE VAGINAL) 0.1 MG/GM vaginal cream Apply a pea size amount to vulva twice a week 42.5 g 1   ezetimibe (ZETIA) 10 MG tablet Take 1 tablet (10 mg total) by mouth daily. 90 tablet 3   levothyroxine (EUTHYROX) 112 MCG tablet Take 1 tablet (112 mcg total) by mouth  daily before breakfast. 90 tablet 1   lidocaine (LIDODERM) 5 % Place 1 patch onto the skin daily. Remove & Discard patch within 12 hours or as directed by MD 30 patch 4   metoprolol succinate (TOPROL XL) 25 MG 24 hr tablet Take 1 tablet (25 mg total) by mouth daily. 90 tablet 3   naproxen (NAPROSYN) 500 MG tablet Take 1 tablet (500 mg total) by mouth 2 (two) times daily with a meal. 60 tablet 1   nicotine polacrilex (NICORETTE MINI) 4 MG lozenge Use two times a day to reduce tobacco use 100 tablet 4   rosuvastatin (CRESTOR) 20 MG tablet Take 1 tablet (20 mg total) by mouth daily. 90 tablet 1   traZODone (DESYREL) 50 MG tablet Take 1 tablet (50 mg total) by mouth at bedtime as needed for sleep. 30 tablet 3   triamcinolone cream (KENALOG) 0.1 % Apply 1 Application topically 2 (two) times daily. 45 g 2   No facility-administered medications prior to visit.     ROS Review of Systems  Constitutional:  Negative for activity change and appetite change.  HENT:  Negative for sinus  pressure and sore throat.   Respiratory:  Negative for chest tightness, shortness of breath and wheezing.   Cardiovascular:  Negative for chest pain and palpitations.  Gastrointestinal:  Negative for abdominal distention, abdominal pain and constipation.  Genitourinary: Negative.   Musculoskeletal:        See HPI  Psychiatric/Behavioral:  Negative for behavioral problems and dysphoric mood.     Objective:  BP (!) 134/93 (BP Location: Left Arm, Patient Position: Sitting, Cuff Size: Normal)   Pulse (!) 120   Wt 202 lb 6.4 oz (91.8 kg)   SpO2 95%   BMI 30.77 kg/m      03/13/2024    2:52 PM 03/13/2024    2:49 PM 01/11/2024   10:17 AM  BP/Weight  Systolic BP 134  952  Diastolic BP 93  83  Wt. (Lbs)  202.4   BMI  30.77 kg/m2       Physical Exam Constitutional:      Appearance: She is well-developed.  Cardiovascular:     Rate and Rhythm: Tachycardia present.     Heart sounds: Normal heart sounds. No murmur heard. Pulmonary:     Effort: Pulmonary effort is normal.     Breath sounds: Normal breath sounds. No wheezing or rales.  Chest:     Chest wall: No tenderness.  Abdominal:     General: Bowel sounds are normal. There is no distension.     Palpations: Abdomen is soft. There is no mass.     Tenderness: There is no abdominal tenderness.  Musculoskeletal:     Right lower leg: No edema.     Left lower leg: No edema.     Comments: Abduction of right shoulder restricted to 60 degrees with associated tenderness on active range of motion  Neurological:     Mental Status: She is alert and oriented to person, place, and time.  Psychiatric:        Mood and Affect: Mood normal.        Latest Ref Rng & Units 01/11/2024   10:29 AM 09/25/2023   10:00 PM 09/25/2023    9:53 PM  CMP  Glucose 70 - 99 mg/dL 94  90    BUN 6 - 24 mg/dL 12  21    Creatinine 8.41 - 1.00 mg/dL 3.24  4.01    Sodium 027 -  144 mmol/L 144  140  141   Potassium 3.5 - 5.2 mmol/L 4.4  4.0  4.0   Chloride 96  - 106 mmol/L 100  104    CO2 20 - 29 mmol/L 26     Calcium 8.7 - 10.2 mg/dL 8.7     Total Protein 6.0 - 8.5 g/dL 7.1     Total Bilirubin 0.0 - 1.2 mg/dL 0.2     Alkaline Phos 44 - 121 IU/L 100     AST 0 - 40 IU/L 19     ALT 0 - 32 IU/L 13       Lipid Panel     Component Value Date/Time   CHOL 219 (H) 01/11/2024 1029   TRIG 114 01/11/2024 1029   HDL 56 01/11/2024 1029   CHOLHDL 4.5 (H) 02/03/2023 0957   CHOLHDL 4.5 04/15/2018 0058   VLDL 28 04/15/2018 0058   LDLCALC 143 (H) 01/11/2024 1029    CBC    Component Value Date/Time   WBC 7.1 01/20/2023 0001   RBC 5.03 01/20/2023 0001   HGB 13.9 09/25/2023 2200   HGB 12.2 01/27/2022 0905   HCT 41.0 09/25/2023 2200   HCT 39.7 01/27/2022 0905   PLT 243 01/20/2023 0001   PLT 375 01/27/2022 0905   MCV 74.8 (L) 01/20/2023 0001   MCV 75 (L) 01/27/2022 0905   MCH 23.7 (L) 01/20/2023 0001   MCHC 31.6 01/20/2023 0001   RDW 15.8 (H) 01/20/2023 0001   RDW 14.7 01/27/2022 0905   LYMPHSABS 1.0 01/20/2023 0001   LYMPHSABS 2.7 01/27/2022 0905   MONOABS 0.6 01/20/2023 0001   EOSABS 0.1 01/20/2023 0001   EOSABS 0.0 01/27/2022 0905   BASOSABS 0.0 01/20/2023 0001   BASOSABS 0.0 01/27/2022 1610    Lab Results  Component Value Date   HGBA1C 6.3 (H) 10/11/2023    Lab Results  Component Value Date   TSH 15.300 (H) 10/11/2023       Assessment & Plan Right shoulder adhesive capsulitis Chronic adhesive capsulitis with persistent pain and limited motion.  Previous history of right shoulder surgeries including arthroscopic surgery, right biceps tenolysis. Possible nerve involvement/impingement indicated by tingling and cramping in the right hand. - PT has been ineffective - Prescribe gabapentin for nerve pain. - Continue Flexeril and naproxen for pain management. - Refer to a new orthopedic specialist for a second opinion  Major depressive disorder Ongoing depression and anxiety exacerbated by the anniversary of her son's death.  Emotional distress and crying spells noted. Previous counseling discontinued due to insurance issues. - Consult with case manager to explore resources for counseling or therapy. - Discuss potential support groups such as [employer].  Hypothyroidism Previous abnormal thyroid function tests while on levothyroxine. Reassessment needed to determine dosage adjustment. - Order thyroid function tests to reassess levothyroxine dosage.  Prediabetes Dietary changes implemented to manage prediabetes. Previous A1c level was 6.3 in 09/2023 - Order A1c test to monitor prediabetes.  Hyperlipidemia High cholesterol noted in February. Dietary changes made. Fasting lipid panel not performed today due to non-fasting state. - Plan to perform fasting lipid panel at a future visit. - Continue statin   Hypertension - Uncontrolled with diastolic elevation - She also has tachycardia - Is currently on metoprolol - Pain could be contributing to her symptoms - Will reassess at next visit   Meds ordered this encounter  Medications   gabapentin (NEURONTIN) 300 MG capsule    Sig: Take 1 capsule (300 mg total)  by mouth at bedtime.    Dispense:  30 capsule    Refill:  3    Follow-up: Return in about 3 months (around 06/12/2024) for Chronic medical conditions.       Joaquin Mulberry, MD, FAAFP. [facility] [place], [place] [phone]   03/13/2024, 4:59 PM

## 2024-03-13 NOTE — Telephone Encounter (Signed)
 I spoke to [facility] regarding grief counseling.  She explained that they have started billing for the counseling services if they are not a relation to a hospice patient. The had informed Dr Newlin that she would have to pay for the counseling services.   Wallene Gum said she would like to verify the patient's insurance coverage and I told her that we would need to get authorization from the patient first.  The patient informed Theotis Flake, RN that she received a bill for the services and gave permission for us  to follow up with [facility] about the bill.  The patient had stated that she wa not aware that she would be receiving a bill.    I called [facility] back :[phone] and left a message for Wallene Gum requesting a call back.

## 2024-03-13 NOTE — Progress Notes (Signed)
 Spoke with patient. Patient's son was shot and  killed last  year and she is having a hard time with moving on. She voiced that she just not motivated. Voiced that she went to [facility] as previously directed and was informed that they are billing for grief counseling and she will be charged and she does not have insurance. Patient has requested a different resource. Contact information for [facility] given. Advised that we would follow-up with [facility] and get back with her once it is verified that they are now billing for grief counseling and we would ask if her insurance would cover the charges as she does have insurance.

## 2024-03-14 NOTE — Telephone Encounter (Signed)
 I spoke to [facility] [facility] grief counseling : [phone] and explained to her that the patient received a bill for the counseling services and she has insurance. I provided Wallene Gum with the patient's policy numbers for her [employer] plan as well as her [facility] [employer].  Wallene Gum said that she will share this information with their billing department and ask them to follow up with the patient.  I called the patient in attempt to explain to her my conversation with Wallene Gum and I had to leave a message requesting a call back.

## 2024-03-14 NOTE — Telephone Encounter (Signed)
  I called [facility] again:[phone] and left a message for Wallene Gum requesting a call back

## 2024-03-14 NOTE — Telephone Encounter (Signed)
 I tried to reach the patient again to inform her that [facility] will be contacting her and I had to leave a message requesting a call back.

## 2024-03-15 ENCOUNTER — Other Ambulatory Visit: Payer: Self-pay | Admitting: Family Medicine

## 2024-03-15 LAB — T4, FREE: Free T4: 1.53 ng/dL (ref 0.82–1.77)

## 2024-03-15 LAB — T3: T3, Total: 95 ng/dL (ref 71–180)

## 2024-03-15 LAB — HEMOGLOBIN A1C
Est. average glucose Bld gHb Est-mCnc: 128 mg/dL
Hgb A1c MFr Bld: 6.1 % — ABNORMAL HIGH (ref 4.8–5.6)

## 2024-03-15 LAB — TSH: TSH: 2.51 u[IU]/mL (ref 0.450–4.500)

## 2024-03-15 MED ORDER — LEVOTHYROXINE SODIUM 112 MCG PO TABS: 112.0000 ug | ORAL_TABLET | Freq: Every day | ORAL | 1 refills | Status: DC

## 2024-03-22 ENCOUNTER — Ambulatory Visit

## 2024-03-22 NOTE — Telephone Encounter (Signed)
 I tried to reach the patient again to inform her that [facility] will be contacting her about the bill she received and I had to leave a message requesting a call back.

## 2024-04-03 ENCOUNTER — Ambulatory Visit

## 2024-04-04 ENCOUNTER — Encounter: Payer: Self-pay | Admitting: Physical Therapy

## 2024-04-04 ENCOUNTER — Ambulatory Visit: Payer: Self-pay | Attending: Family Medicine | Admitting: Physical Therapy

## 2024-04-04 DIAGNOSIS — M25511 Pain in right shoulder: Secondary | ICD-10-CM | POA: Insufficient documentation

## 2024-04-04 DIAGNOSIS — G8929 Other chronic pain: Secondary | ICD-10-CM | POA: Diagnosis present

## 2024-04-04 DIAGNOSIS — R600 Localized edema: Secondary | ICD-10-CM | POA: Diagnosis present

## 2024-04-04 DIAGNOSIS — M25611 Stiffness of right shoulder, not elsewhere classified: Secondary | ICD-10-CM | POA: Insufficient documentation

## 2024-04-04 DIAGNOSIS — M6281 Muscle weakness (generalized): Secondary | ICD-10-CM | POA: Diagnosis present

## 2024-04-04 NOTE — Therapy (Signed)
 OUTPATIENT PHYSICAL THERAPY SHOULDER TREATMENT    Patient Name: Meagan Flores MRN: [id number] DOB:1966-01-30, 58 y.o., female Today's Date: 04/04/2024  END OF SESSION:  PT End of Session - 04/04/24 0834     Visit Number 9    Authorization Type [employer]    PT Start Time 0803    PT Stop Time 0827   pt unable to tolerate session/declined most interventions   PT Time Calculation (min) 24 min    Activity Tolerance No increased pain    Behavior During Therapy Flat affect;Anxious                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   Prediabetes    Preop cardiovascular exam 11/25/2020   Pulmonary hypertension, unspecified (HCC) 11/25/2020   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   Rotator cuff impingement syndrome of right shoulder 09/30/2020   Skin rash 02/17/2021   Stroke (HCC)    Tendinopathy of right biceps tendon 09/30/2020   Thyroid  disease    TIA (transient ischemic attack) 04/15/2018   Tobacco use 09/28/2016   Past Surgical History:  Procedure Laterality Date   BICEPT TENODESIS Right 12/16/2021   Procedure: RIGHT BICEPS TENODESIS;  Surgeon: Wes Hamman, MD;  Location: [facility];  Service: Orthopedics;  Laterality: Right;   PARTIAL HYSTERECTOMY     right arm sx     SHOULDER ARTHROSCOPY WITH BICEPSTENOTOMY Right 01/21/2021   Procedure: SHOULDER ARTHROSCOPY WITH BICEPSTENOTOMY;  Surgeon: Wes Hamman, MD;  Location: [facility];  Service: Orthopedics;  Laterality: Right;   SHOULDER ARTHROSCOPY WITH DISTAL CLAVICLE RESECTION Right 01/12/2017   Procedure: RIGHT SHOULDER ARTHROSCOPY WITH  SUBACROMIAL DECOMPRESSION, DISTAL CLAVICLE EXCISION, extensive debridement;  Surgeon: Wes Hamman, MD;  Location: 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   Grief at loss of child 03/23/2023   Chronic right shoulder pain 07/01/2022   BMI 33.0-33.9,adult 05/31/2022   Biceps tendonosis of right shoulder 02/10/2022   S/P arthroscopy of right shoulder 02/10/2022   Eczema 02/17/2021   Pulmonary hypertension, unspecified (HCC) 11/25/2020   Prediabetes    Degenerative superior labral anterior-to-posterior (SLAP) tear of right shoulder 09/30/2020   Rotator cuff impingement syndrome of right shoulder 09/30/2020   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   Tendinopathy of right biceps tendon 09/30/2020   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   Aneurysm (HCC) 03/18/2020   History of TIA (transient ischemic attack) 02/14/2020   History of stroke    Mixed hyperlipidemia    Essential hypertension    Impingement syndrome of right shoulder 12/24/2016   Tobacco use 09/28/2016   Glenohumeral arthritis 12/27/2014   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    PCP: Joaquin Mulberry  REFERRING PROVIDER: Joaquin Mulberry  REFERRING DIAG:  M75.41 (ICD-10-CM) - Impingement syndrome  of right shoulder    THERAPY DIAG:  Chronic right shoulder pain  Stiffness of right shoulder, not elsewhere classified  Muscle weakness (generalized)  Localized edema  Rationale for Evaluation and Treatment: Rehabilitation  ONSET DATE: ~5 years  SUBJECTIVE:                                                                                                                                                                                       SUBJECTIVE STATEMENT       Did Meagan Flores send or do anything with the note from last time? She said she would the paperwork to the doctors. I don't have an appointment with the other ortho yet, all I know is the referral was sent. Don't know the name of this second doctor my primary wanted me to go to.     Hand dominance: Right  PERTINENT HISTORY: TIA 2019 and 2021, HTN, hx of stroke, SLAP lesion 2021, ACJ arthrosis  R bicep tenodesis 2023 Shoulder arthroscopy 2022  58 yo female with ~5 yr hx of Right shoulder pain , underwent Right distal clavicular resection per Dr Christiane Cowing in 2018, RIght biceps tenotomy and labral repair in 2022, Biceps tenodesis in 2023.  Pt had PT after each surgery.  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 Yes: NPRS scale: 7/10 now, no change from last visit  Pain location: stays in R shoulder  Pain description: sharp pain Aggravating factors: lifting it up, using it Relieving factors: nothing- I try ice,heat,and pain pills but it does not help   PRECAUTIONS: None  RED FLAGS: None   WEIGHT BEARING RESTRICTIONS: No  FALLS:  Has patient fallen in last 6 months? Yes. Number of falls 2  LIVING ENVIRONMENT: Lives with: lives with their spouse Lives in: House/apartment Stairs: No  OCCUPATION: Not working   PLOF: Independent and Independent with basic ADLs  PATIENT GOALS:I want to be able to use my arm   NEXT MD VISIT:   OBJECTIVE:  Note: Objective measures were completed at Evaluation unless otherwise noted.   COGNITION: Overall cognitive status: Within functional limits for tasks assessed     SENSATION: WFL  POSTURE: Rounded shoulders  UPPER EXTREMITY ROM:   Active ROM Right eval Left eval RT  standing 02/14/24 RT standing 02/23/24 Rt standing 03/06/24  Shoulder flexion 95 w/pain  95 105 95 pain  Shoulder extension       Shoulder abduction 55 w/pain  55 68 65 pain  Shoulder adduction  Shoulder internal rotation Unable to measure d/t pain  Arm at side 75 Arm at side 75   Shoulder external rotation Unable to measure d/t pain  Arm at side 45  Arm at side 50   Elbow flexion       Elbow extension       Wrist flexion       Wrist extension       Wrist ulnar deviation       Wrist radial deviation       Wrist pronation       Wrist supination       (Blank rows = not tested)  UPPER EXTREMITY MMT:  MMT Right eval Left eval RT 02/23/24   Shoulder flexion 2-  2  Shoulder extension     Shoulder abduction 2-  2  Shoulder adduction     Shoulder internal rotation 2+  2+  Shoulder external rotation 2+  2+  Middle trapezius     Lower trapezius     Elbow flexion     Elbow extension     Wrist flexion     Wrist extension     Wrist ulnar deviation     Wrist radial deviation     Wrist pronation     Wrist supination     Grip strength (lbs)     (Blank rows = not tested)  SHOULDER SPECIAL TESTS: Impingement tests: Neer impingement test: positive , Hawkins/Kennedy impingement test: positive , and Painful arc test: positive  Rotator cuff assessment: Drop arm test: positive  and Empty can test: positive  Biceps assessment: Speed's test: positive   JOINT MOBILITY TESTING:  Very minimal movements, patient is super guarded and pain prevents movement   PALPATION:  TTP around R shoulder                                                                                                                              TREATMENT DATE:    04/04/24  UBE L2x3 min forward/3 min back  Ionto patch 1.0mL Dex, 4 hour wear time  Lots of education that since she has seen minimal improvement in her shoulder with this round of PT, and has limited insurance visits, best POC is to go on hold and  get formal 2nd opinion from this other ortho before continuing- pt in agreement      03/06/24 UBE L 2 3 min fwd/ 3 min back PROM RT shld all direction with pain Joint mobs RT shld ROM and goals checked   02/28/24 UBE L 2 3 min fwd/ 3 min back Cable pulley row 2 sets 8 with postural cuing needed Tricep ext 15# 2 sets 10 Bicep Curl 5# 10 x 1# cabinet reaching 10 x flex and abd Trampoline wt ball drop and catch then supine/pronation catch 1# func reaching 10 x Yellow tband IR and ER 2 sets 10 Yellow tband shld ext 10 x 2# chest press 10 x 2#  diagonals and circles 10 x each way  AA PROM/stretch  of R shoulder to tolerance all directions with joint oscillations in between reps .    02/23/24 UBE 2 min fwd /2 min backward L 2 ROM and MMT checked and documented above Goals checked and documented below Finger ladder flex and abd 5 x each 2# cane shld ext and IR 10x each 2# cane shld row ,chest press and bicep 10x each Yellow tband shld ext and row 10x Supine 2# cane chest press, flex and ER PROM/stretch  of R shoulder to tolerance all directions with joint oscillations in between reps .   02/21/24 Seated pulleys flex,abd and ER 10 x with some assist Standing shld ext and IR UBE 2 min each way L 1 Rolling ball on ball fwd, back and laterally, CW and CCW 10 x each Row and shld ext yellow tband 2 sets 10 Nustep standing L 1 push and pull then horz abd 2 min PROM/stretch  of R shoulder to tolerance all directions with joint oscillations in between reps .    02/17/24 AAROM shoulder Flex, Ext, IR x5 each Rows & Ext yellow 2x10 PROM/stretch  of R shoulder to tolerance all directions with joint oscillations in between reps . 02/14/24 PROM/stretch  of R shoulder to tolerance all directions with joint oscillations in between reps . Neural tension stretching RT UE + Self NT stretching on wall -issued to do at home 2# rotation behind back 5x 2# cane shld ext and IR 8 x each 2#  cane shld row and bicep 8x each 3# shrugs, shld rolls backward, shld ext,scap squeeze AROM taken - pain with all motions   02/08/24  PROM/stretch  of R shoulder to tolerance all directions with joint oscillations in between reps   Ionto 4 hour patch anterior R shoulder 1.30mL with appropriate education, wear time, precautions       EVAL 01/30/24   PATIENT EDUCATION: Education details: POC and HEP, impingement syndrome and adhesive capsulitis anatomy  Person educated: Patient Education method: Explanation Education comprehension: verbalized understanding  HOME EXERCISE PROGRAM: Access Code: [email or web address] URL: [email or web address] Date: 01/30/2024 Prepared by: Donavon Fudge  Exercises - Seated Shoulder Flexion Towel Slide at Table Top  - 1 x daily - 7 x weekly - 3 sets - 10 reps - Seated Shoulder Abduction Towel Slide at Table Top  - 1 x daily - 7 x weekly - 3 sets - 10 reps - Seated Elbow Extension and Shoulder External Rotation AAROM at Table with Towel  - 1 x daily - 7 x weekly - 3 sets - 10 reps - Seated Elbow Flexion Shoulder Internal Rotation AAROM at Table with Towel  - 1 x daily - 7 x weekly - 3 sets - 10 reps - Standing Isometric Shoulder Flexion with Doorway - Arm Bent  - 1 x daily - 7 x weekly - 3 sets - 10 reps - Standing Isometric Shoulder Internal Rotation with Towel Roll at Doorway  - 1 x daily - 7 x weekly - 3 sets - 10 reps - Standing Isometric Shoulder External Rotation with Doorway and Towel Roll  - 1 x daily - 7 x weekly - 3 sets - 10 reps - Standing Isometric Shoulder Abduction with Doorway - Arm Bent  - 1 x daily - 7 x weekly - 3 sets - 10 reps - Standing Isometric Shoulder Extension with Doorway - Arm Bent  - 1 x daily - 7 x weekly - 3 sets -  10 reps  ASSESSMENT:  CLINICAL IMPRESSION:    Pt arrives today still in high levels of pain, she perseverated on last therapist she saw here and asking if that note was sent to the other ortho she is seeing,  also perseverated on not wanting to see the other doctor that had been working with her on her shoulder. Redirected as able today, educated that the best thing will be for her to let us  know when she does have an appointment with the new doctor so that we can send most recent measures- she is still waiting to be scheduled for this. She is going to court on the 22nd for social security. We will continue to make our best efforts, I do think her pain is exacerbated by the extreme psychosocial situation she has found herself in given the recent murder of her son. Offered ROM/isometrics/etc but she politely declined all interventions besides UBE and ionto. She was unable to tell me the name of the 2nd ortho that her PCP has referred her to, unable to reach out or send objective measures/previous PT notes. At this point I think especially given limited PT visits through her insurance, it is in her best interest for us  to hold PT at least until she sees this 2nd ortho specialist to further determine best POC moving forward, especially as she seems to have had very limited if any pain relief from this round of PT.   OBJECTIVE IMPAIRMENTS: difficulty walking, decreased ROM, decreased strength, impaired UE functional use, improper body mechanics, and pain.   ACTIVITY LIMITATIONS: carrying, lifting, bathing, dressing, reach over head, and hygiene/grooming  PARTICIPATION LIMITATIONS: shopping, occupation, yard work, and church  PERSONAL FACTORS: Age, Behavior pattern, Past/current experiences, and Time since onset of injury/illness/exacerbation are also affecting patient's functional outcome.   REHAB POTENTIAL: Fair chronicity of impairments, multiple surgeries    CLINICAL DECISION MAKING: Evolving/moderate complexity  EVALUATION COMPLEXITY: Low  GOALS: Goals reviewed with patient? Yes  SHORT TERM GOALS: Target date: 03/12/24  Patient will be independent with initial HEP.  Baseline: given 01/30/24 Goal  status: 02/14/24 MET   LONG TERM GOALS: Target date: 04/23/24  Patient will be independent with advanced/ongoing HEP to improve outcomes and carryover.  Baseline:  Goal status: 03/06/24 ongoing  2.  Patient will report 75% improvement in R shoulder pain to improve QOL. (3/10 pain or better) Baseline: 7/10 Goal status: 03/06/24 no chnages  3.  Patient to improve R shoulder AROM to [facility] without pain provocation to allow for increased ease of ADLs.  Baseline: see chart Goal status: 02/23/24 progressing  and 03/06/24  4.  Patient will demonstrate improved functional UE strength as demonstrated by 4/5. Baseline: 2-flex/abd, 2+ER/IR Goal status: 02/23/24 progressing   PLAN:  PT FREQUENCY: 2x/week  PT DURATION: 12 weeks  PLANNED INTERVENTIONS: 97110-Therapeutic exercises, 97530- Therapeutic activity, 97112- Neuromuscular re-education, 97535- Self Care, 16109- Manual therapy, [id number]- Electrical stimulation (unattended), 97016- Vasopneumatic device, 97033- Ionotophoresis 4mg /ml Dexamethasone , Patient/Family education, Taping, Dry Needling, Joint mobilization, Joint manipulation, Spinal manipulation, Spinal mobilization, Cryotherapy, and Moist heat  PLAN FOR NEXT SESSION: on hold until she sees 2nd ortho  Terrel Ferries, PT, DPT 04/04/24 8:36 AM     [facility] [address] [facility] [address] [place], [place], [address] Phone: [phone]   Fax:  [phone] [address] [place], [place], [address] Phone: [phone]   Fax:  [phone] at [facility] [address] [place], [place], [address] Phone: [phone]   Fax:  [phone]

## 2024-04-10 ENCOUNTER — Ambulatory Visit: Admitting: Physical Therapy

## 2024-04-12 ENCOUNTER — Other Ambulatory Visit: Payer: Self-pay | Admitting: Critical Care Medicine

## 2024-04-17 ENCOUNTER — Ambulatory Visit: Admitting: Physical Therapy

## 2024-06-12 ENCOUNTER — Telehealth: Payer: Self-pay | Admitting: *Deleted

## 2024-06-12 ENCOUNTER — Ambulatory Visit: Attending: Family Medicine | Admitting: Family Medicine

## 2024-06-12 VITALS — BP 157/88 | HR 55 | Temp 98.7°F | Ht 68.0 in | Wt 198.0 lb

## 2024-06-12 DIAGNOSIS — F4323 Adjustment disorder with mixed anxiety and depressed mood: Secondary | ICD-10-CM | POA: Diagnosis present

## 2024-06-12 DIAGNOSIS — F17210 Nicotine dependence, cigarettes, uncomplicated: Secondary | ICD-10-CM | POA: Diagnosis not present

## 2024-06-12 DIAGNOSIS — G4700 Insomnia, unspecified: Secondary | ICD-10-CM | POA: Diagnosis not present

## 2024-06-12 DIAGNOSIS — F329 Major depressive disorder, single episode, unspecified: Secondary | ICD-10-CM | POA: Diagnosis not present

## 2024-06-12 DIAGNOSIS — Z720 Tobacco use: Secondary | ICD-10-CM

## 2024-06-12 DIAGNOSIS — N898 Other specified noninflammatory disorders of vagina: Secondary | ICD-10-CM

## 2024-06-12 DIAGNOSIS — Z5971 Insufficient health insurance coverage: Secondary | ICD-10-CM | POA: Diagnosis not present

## 2024-06-12 DIAGNOSIS — Z634 Disappearance and death of family member: Secondary | ICD-10-CM | POA: Diagnosis not present

## 2024-06-12 DIAGNOSIS — Z79899 Other long term (current) drug therapy: Secondary | ICD-10-CM | POA: Insufficient documentation

## 2024-06-12 DIAGNOSIS — I10 Essential (primary) hypertension: Secondary | ICD-10-CM

## 2024-06-12 DIAGNOSIS — R441 Visual hallucinations: Secondary | ICD-10-CM | POA: Diagnosis not present

## 2024-06-12 DIAGNOSIS — M7541 Impingement syndrome of right shoulder: Secondary | ICD-10-CM

## 2024-06-12 DIAGNOSIS — E890 Postprocedural hypothyroidism: Secondary | ICD-10-CM

## 2024-06-12 DIAGNOSIS — R630 Anorexia: Secondary | ICD-10-CM | POA: Insufficient documentation

## 2024-06-12 DIAGNOSIS — E782 Mixed hyperlipidemia: Secondary | ICD-10-CM

## 2024-06-12 DIAGNOSIS — Z79890 Hormone replacement therapy (postmenopausal): Secondary | ICD-10-CM | POA: Diagnosis not present

## 2024-06-12 DIAGNOSIS — R7303 Prediabetes: Secondary | ICD-10-CM

## 2024-06-12 DIAGNOSIS — Z716 Tobacco abuse counseling: Secondary | ICD-10-CM | POA: Diagnosis not present

## 2024-06-12 DIAGNOSIS — Z8673 Personal history of transient ischemic attack (TIA), and cerebral infarction without residual deficits: Secondary | ICD-10-CM | POA: Diagnosis not present

## 2024-06-12 DIAGNOSIS — R519 Headache, unspecified: Secondary | ICD-10-CM | POA: Diagnosis not present

## 2024-06-12 DIAGNOSIS — I272 Pulmonary hypertension, unspecified: Secondary | ICD-10-CM | POA: Diagnosis not present

## 2024-06-12 MED ORDER — CLOTRIMAZOLE 1 % EX CREA: 1.0000 | TOPICAL_CREAM | Freq: Two times a day (BID) | CUTANEOUS | 0 refills | Status: DC

## 2024-06-12 MED ORDER — CYCLOBENZAPRINE HCL 10 MG PO TABS: 10.0000 mg | ORAL_TABLET | Freq: Every day | ORAL | 1 refills | Status: DC

## 2024-06-12 MED ORDER — ESCITALOPRAM OXALATE 10 MG PO TABS: 10.0000 mg | ORAL_TABLET | Freq: Every day | ORAL | 1 refills | Status: DC

## 2024-06-12 MED ORDER — METOPROLOL SUCCINATE ER 25 MG PO TB24: 25.0000 mg | ORAL_TABLET | Freq: Every day | ORAL | 1 refills | Status: AC

## 2024-06-12 MED ORDER — LIDOCAINE 5 % EX PTCH: 1.0000 | MEDICATED_PATCH | CUTANEOUS | 1 refills | Status: DC

## 2024-06-12 MED ORDER — TRAZODONE HCL 100 MG PO TABS: 100.0000 mg | ORAL_TABLET | Freq: Every evening | ORAL | 1 refills | Status: DC | PRN

## 2024-06-12 MED ORDER — METOPROLOL SUCCINATE ER 25 MG PO TB24: 25.0000 mg | ORAL_TABLET | Freq: Every day | ORAL | 3 refills | Status: DC

## 2024-06-12 MED ORDER — BUPROPION HCL ER (XL) 150 MG PO TB24: 150.0000 mg | ORAL_TABLET | Freq: Every day | ORAL | 1 refills | Status: DC

## 2024-06-12 NOTE — Progress Notes (Signed)
 Complex Care Management Note  Care Guide Note 06/12/2024 Name: Meagan Flores MRN: [id number] DOB: [birth year]  Meagan Flores is a 58 y.o. year old female who sees Newlin, Enobong, MD for primary care. I reached out to Meagan Flores by phone today to offer complex care management services.  [name] was given information about Complex Care Management services today including:   The Complex Care Management services include support from the care team which includes your Nurse Care Manager, Clinical Social Worker, or Pharmacist.  The Complex Care Management team is here to help remove barriers to the health concerns and goals most important to you. Complex Care Management services are voluntary, and the patient may decline or stop services at any time by request to their care team member.   Complex Care Management Consent Status: Patient agreed to services and verbal consent obtained.   Follow up plan:  Telephone appointment with complex care management team member scheduled for:  06/18/24  Encounter Outcome:  Patient Scheduled  Harlene Satterfield  [facility]  [facility] Guide  Direct Dial: [phone]  Fax [phone]

## 2024-06-12 NOTE — Progress Notes (Signed)
 Subjective:  Patient ID: Meagan Flores, female    DOB: 1966-09-29  Age: 58 y.o. MRN: [id number]  CC: No chief complaint on file.     Discussed the use of AI scribe software for clinical note transcription with the patient, who gave verbal consent to proceed.  History of Present Illness Meagan Flores is a 58 year old female a history of  hyperlipidemia, stroke, major depressive disorder, hypothyroidism, hypertension (diet-controlled), pulmonary hypertension, right shoulder adhesive capsulitis (status post previous right shoulder surgeries), nicotine  dependence, Prediabetes who presents with insomnia and grief-related symptoms.  She experiences significant difficulty sleeping, seeing her son whenever she closes her eyes. Trazodone  50 mg at bedtime and Lexapro  for anxiety and depression have not improved her sleep. Nausea and vomiting occur when thinking about her son, and she has poor appetite.  She discontinued grief counseling due to insurance issues and an unexpected bill, affecting her ability to cope with her son's loss. Her BP is elevated and she has not been adherent with her antihypertensive as she does not think she has Hypertensin but that her recent stressors have caused elevated BP. She is adherent with her Levothyroxine  for Hypothyroidism. She currently smokes and wants to quit. She is open to trying medication to help reduce cravings.    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   Prediabetes    Preop cardiovascular exam 11/25/2020   Pulmonary hypertension, unspecified (HCC) 11/25/2020   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   Rotator cuff impingement syndrome of right shoulder 09/30/2020   Skin rash 02/17/2021   Stroke (HCC)    Tendinopathy of right biceps tendon 09/30/2020   Thyroid  disease    TIA (transient ischemic attack) 04/15/2018   Tobacco use 09/28/2016    Past Surgical History:  Procedure Laterality Date   BICEPT TENODESIS Right 12/16/2021   Procedure: RIGHT BICEPS TENODESIS;  Surgeon: Jerri Kay HERO, MD;  Location: [facility];  Service: Orthopedics;  Laterality: Right;   PARTIAL HYSTERECTOMY     right arm sx     SHOULDER ARTHROSCOPY WITH BICEPSTENOTOMY Right 01/21/2021   Procedure: SHOULDER ARTHROSCOPY WITH BICEPSTENOTOMY;  Surgeon: Jerri Kay HERO, MD;  Location: [facility];  Service: Orthopedics;  Laterality: Right;   SHOULDER ARTHROSCOPY WITH DISTAL CLAVICLE RESECTION Right 01/12/2017   Procedure: RIGHT SHOULDER ARTHROSCOPY WITH  SUBACROMIAL DECOMPRESSION, DISTAL CLAVICLE EXCISION, extensive debridement;  Surgeon: Kay HERO Jerri, MD;  Location: [facility];  Service: Orthopedics;  Laterality: Right;   THYMECTOMY  2004   THYROIDECTOMY      Family History  Problem Relation Age of Onset   Stomach cancer Sister    Colon polyps Sister    Esophageal cancer Neg Hx    Rectal cancer Neg Hx    Colon cancer Neg Hx    Breast cancer Neg Hx     Social History   Socioeconomic History   Marital status: Married    Spouse name: Not on file  Number of children: Not on file   Years of education: Not on file   Highest education level: Not on file  Occupational History   Not on file  Tobacco Use   Smoking status: Every Day    Current packs/day: 0.50    Average packs/day: 0.5 packs/day for 34.0 years (17.0 ttl pk-yrs)    Types: Cigarettes   Smokeless tobacco: Never  Vaping Use    Vaping status: Never Used  Substance and Sexual Activity   Alcohol use: Yes    Comment: occassional   Drug use: No   Sexual activity: Not on file  Other Topics Concern   Not on file  Social History Narrative   Not on file   Social Drivers of Health   Financial Resource Strain: Medium Risk (10/11/2023)   Overall Financial Resource Strain (CARDIA)    Difficulty of Paying Living Expenses: Somewhat hard  Food Insecurity: Food Insecurity Present (10/11/2023)   Hunger Vital Sign    Worried About Running Out of Food in the Last Year: Sometimes true    Ran Out of Food in the Last Year: Sometimes true  Transportation Needs: No Transportation Needs (10/11/2023)   PRAPARE - Administrator, Civil Service (Medical): No    Lack of Transportation (Non-Medical): No  Physical Activity: Inactive (10/11/2023)   Exercise Vital Sign    Days of Exercise per Week: 0 days    Minutes of Exercise per Session: 0 min  Stress: Stress Concern Present (10/11/2023)   [employer] of Occupational Health - Occupational Stress Questionnaire    Feeling of Stress : To some extent  Social Connections: Unknown (10/11/2023)   Social Connection and Isolation Panel    Frequency of Communication with Friends and Family: Three times a week    Frequency of Social Gatherings with Friends and Family: Never    Attends Religious Services: Not on Marketing executive or Organizations: No    Attends Engineer, structural: Not on file    Marital Status: Married    Allergies  Allergen Reactions   Sulfa Antibiotics Hives    Outpatient Medications Prior to Visit  Medication Sig Dispense Refill   acetaminophen -codeine  (TYLENOL  #3) 300-30 MG tablet Take 1 tablet by mouth every 4 (four) hours as needed for moderate pain or severe pain. 60 tablet 1   aspirin  EC 81 MG tablet Take 1 tablet (81 mg total) by mouth daily. 60 tablet 3   estradiol  (ESTRACE  VAGINAL) 0.1 MG/GM vaginal cream Apply a  pea size amount to vulva twice a week 42.5 g 1   ezetimibe  (ZETIA ) 10 MG tablet Take 1 tablet (10 mg total) by mouth daily. 90 tablet 3   levothyroxine  (EUTHYROX ) 112 MCG tablet Take 1 tablet (112 mcg total) by mouth daily before breakfast. 90 tablet 1   naproxen  (NAPROSYN ) 500 MG tablet Take 1 tablet (500 mg total) by mouth 2 (two) times daily with a meal. 60 tablet 1   nicotine  polacrilex (NICORETTE  MINI) 4 MG lozenge Use two times a day to reduce tobacco use 100 tablet 4   rosuvastatin  (CRESTOR ) 20 MG tablet Take 1 tablet (20 mg total) by mouth daily. 90 tablet 1   triamcinolone  cream (KENALOG ) 0.1 % Apply 1 Application topically 2 (two) times daily. 45 g 2   cyclobenzaprine  (FLEXERIL ) 10 MG tablet Take 1 tablet (10 mg total) by mouth at bedtime. 90 tablet 1   escitalopram  (LEXAPRO ) 10 MG tablet Take 1  tablet (10 mg total) by mouth daily. 90 tablet 1   gabapentin  (NEURONTIN ) 300 MG capsule Take 1 capsule (300 mg total) by mouth at bedtime. 30 capsule 3   lidocaine  (LIDODERM ) 5 % PLACE 1 PATCH ONTO THE SKIN ONCE DAILY. REMOVE & DISCARD PATCH WITHIN 12 HOURS OR AS DIRECTED BY MD. 30 patch 0   metoprolol  succinate (TOPROL  XL) 25 MG 24 hr tablet Take 1 tablet (25 mg total) by mouth daily. 90 tablet 3   traZODone  (DESYREL ) 50 MG tablet Take 1 tablet (50 mg total) by mouth at bedtime as needed for sleep. 30 tablet 3   No facility-administered medications prior to visit.     ROS Review of Systems  Constitutional:  Negative for activity change and appetite change.  HENT:  Negative for sinus pressure and sore throat.   Respiratory:  Negative for chest tightness, shortness of breath and wheezing.   Cardiovascular:  Negative for chest pain and palpitations.  Gastrointestinal:  Negative for abdominal distention, abdominal pain and constipation.  Genitourinary: Negative.   Musculoskeletal: Negative.   Psychiatric/Behavioral:  Positive for dysphoric mood. Negative for behavioral problems.      Objective:  BP (!) 157/88 (BP Location: Right Arm, Patient Position: Sitting)   Pulse (!) 55   Temp 98.7 F (37.1 C) (Oral)   Ht 5' 8 (1.727 m)   Wt 198 lb (89.8 kg)   SpO2 98%   BMI 30.11 kg/m      06/12/2024    9:04 AM 03/13/2024    2:52 PM 03/13/2024    2:49 PM  BP/Weight  Systolic BP 157 134   Diastolic BP 88 93   Wt. (Lbs) 198  202.4  BMI 30.11 kg/m2  30.77 kg/m2      Physical Exam Constitutional:      Appearance: She is well-developed.  Cardiovascular:     Rate and Rhythm: Normal rate.     Heart sounds: Normal heart sounds. No murmur heard. Pulmonary:     Effort: Pulmonary effort is normal.     Breath sounds: Normal breath sounds. No wheezing or rales.  Chest:     Chest wall: No tenderness.  Abdominal:     General: Bowel sounds are normal. There is no distension.     Palpations: Abdomen is soft. There is no mass.     Tenderness: There is no abdominal tenderness.  Musculoskeletal:        General: Normal range of motion.     Right lower leg: No edema.     Left lower leg: No edema.  Neurological:     Mental Status: She is alert and oriented to person, place, and time.  Psychiatric:     Comments: Dysphoric mood        Latest Ref Rng & Units 01/11/2024   10:29 AM 09/25/2023   10:00 PM 09/25/2023    9:53 PM  CMP  Glucose 70 - 99 mg/dL 94  90    BUN 6 - 24 mg/dL 12  21    Creatinine 9.42 - 1.00 mg/dL 9.16  9.09    Sodium 865 - 144 mmol/L 144  140  141   Potassium 3.5 - 5.2 mmol/L 4.4  4.0  4.0   Chloride 96 - 106 mmol/L 100  104    CO2 20 - 29 mmol/L 26     Calcium  8.7 - 10.2 mg/dL 8.7     Total Protein 6.0 - 8.5 g/dL 7.1     Total Bilirubin 0.0 -  1.2 mg/dL 0.2     Alkaline Phos 44 - 121 IU/L 100     AST 0 - 40 IU/L 19     ALT 0 - 32 IU/L 13       Lipid Panel     Component Value Date/Time   CHOL 219 (H) 01/11/2024 1029   TRIG 114 01/11/2024 1029   HDL 56 01/11/2024 1029   CHOLHDL 4.5 (H) 02/03/2023 0957   CHOLHDL 4.5 04/15/2018 0058    VLDL 28 04/15/2018 0058   LDLCALC 143 (H) 01/11/2024 1029    CBC    Component Value Date/Time   WBC 7.1 01/20/2023 0001   RBC 5.03 01/20/2023 0001   HGB 13.9 09/25/2023 2200   HGB 12.2 01/27/2022 0905   HCT 41.0 09/25/2023 2200   HCT 39.7 01/27/2022 0905   PLT 243 01/20/2023 0001   PLT 375 01/27/2022 0905   MCV 74.8 (L) 01/20/2023 0001   MCV 75 (L) 01/27/2022 0905   MCH 23.7 (L) 01/20/2023 0001   MCHC 31.6 01/20/2023 0001   RDW 15.8 (H) 01/20/2023 0001   RDW 14.7 01/27/2022 0905   LYMPHSABS 1.0 01/20/2023 0001   LYMPHSABS 2.7 01/27/2022 0905   MONOABS 0.6 01/20/2023 0001   EOSABS 0.1 01/20/2023 0001   EOSABS 0.0 01/27/2022 0905   BASOSABS 0.0 01/20/2023 0001   BASOSABS 0.0 01/27/2022 9094    Lab Results  Component Value Date   HGBA1C 6.1 (H) 03/13/2024    Lab Results  Component Value Date   TSH 2.510 03/13/2024       Assessment & Plan Grief and Insomnia Insomnia due to grief with visual hallucinations, nausea, and anorexia. Current trazodone  dose ineffective. - Increase trazodone  to 100 mg at bedtime. - Refer to [facility] for counseling resources.  Reactive Depression Persistent depressive symptoms exacerbated by grief. Currently on escitalopram . - Continue escitalopram  10 mg daily.  Hypertension Elevated blood pressure likely due to stress, insomnia, and smoking. Previous metoprolol  was self-discontinued. - Re-prescribe metoprolol  succinate 25 mg daily. - Advise home blood pressure monitoring and recording.  Tobacco Use Disorder Desire to quit smoking without current cessation aids. - Prescribe bupropion  for smoking cessation.  Right Shoulder impingement Persistent pain managed with muscle relaxants and Lidoderm  patch. Gabapentin  discontinued due to heart palpitations. - Continue muscle relaxant and Lidoderm  patch. - Discontinue gabapentin .  Vaginal Itching Intermittent pruritus at the top of the pubic region without  discharge. - Prescribe vaginal cream for symptomatic relief.  Hyperlipidemia Elevated cholesterol levels previously noted. - Order cholesterol test.  Prediabetes Ongoing monitoring to prevent progression to diabetes. - Continue monitoring blood glucose levels.  Hypothyroidism Thyroid  function normal in April. Monitoring to ensure stability. - Check thyroid  function and adjust levothyroxine  if necessary.    Meds ordered this encounter  Medications   traZODone  (DESYREL ) 100 MG tablet    Sig: Take 1 tablet (100 mg total) by mouth at bedtime as needed for sleep.    Dispense:  90 tablet    Refill:  1   buPROPion  (WELLBUTRIN  XL) 150 MG 24 hr tablet    Sig: Take 1 tablet (150 mg total) by mouth daily. For smoking cessation    Dispense:  90 tablet    Refill:  1   clotrimazole  (LOTRIMIN ) 1 % cream    Sig: Apply 1 Application topically 2 (two) times daily.    Dispense:  30 g    Refill:  0   cyclobenzaprine  (FLEXERIL ) 10 MG tablet  Sig: Take 1 tablet (10 mg total) by mouth at bedtime.    Dispense:  90 tablet    Refill:  1    Dose increased   escitalopram  (LEXAPRO ) 10 MG tablet    Sig: Take 1 tablet (10 mg total) by mouth daily.    Dispense:  90 tablet    Refill:  1   metoprolol  succinate (TOPROL  XL) 25 MG 24 hr tablet    Sig: Take 1 tablet (25 mg total) by mouth daily.    Dispense:  90 tablet    Refill:  3   lidocaine  (LIDODERM ) 5 %    Sig: Place 1 patch onto the skin daily. Remove & Discard patch within 12 hours or as directed by MD    Dispense:  90 patch    Refill:  1    Return in about 3 months (around 09/12/2024) for Chronic medical conditions.       Corrina Sabin, MD, FAAFP. [facility] [place], [place] [phone]   06/12/2024, 9:24 AM

## 2024-06-12 NOTE — Patient Instructions (Signed)
 Managing Loss, Adult  People experience loss in many different ways throughout their lives. Events such as moving, changing jobs, and losing friends can create a sense of loss. The loss may be as serious as a major health change, divorce, death of a pet, or death of a loved one. All of these types of loss are likely to create a physical and emotional reaction known as grief. Grief is the result of a major change or an absence of something or someone that you count on. Grief is a normal reaction to loss.  A variety of factors can affect your grieving experience, including:  The nature of your loss.  Your relationship to what or whom you lost.  Your understanding of grief and how to manage it.  Your support system.  Be aware that when grief becomes extreme, it can lead to more severe issues like isolation, depression, anxiety, or suicidal thoughts. Talk with your health care provider if you have any of these issues.  How to manage lifestyle changes    Keep to your normal routine as much as possible.  If you have trouble focusing or doing normal activities, it is acceptable to take some time away from your normal routine.  Spend time with friends and loved ones.  Eat a healthy diet, get plenty of sleep, and rest when you feel tired.  How to recognize changes   The way that you deal with your grief will affect your ability to function as you normally do. When grieving, you may experience these changes:  Numbness, shock, sadness, anxiety, anger, denial, and guilt.  Thoughts about death.  Unexpected crying.  A physical sensation of emptiness in your stomach.  Problems sleeping and eating.  Tiredness (fatigue).  Loss of interest in normal activities.  Dreaming about or imagining seeing the person who died.  A need to remember what or whom you lost.  Difficulty thinking about anything other than your loss for a period of time.  Relief. If you have been expecting the loss for a while, you may feel a sense of relief when it  happens.  Follow these instructions at home:  Activity    Express your feelings in healthy ways, such as:  Talking with others about your loss. It may be helpful to find others who have had a similar loss, such as a support group.  Writing down your feelings in a journal.  Doing physical activities to release stress and emotional energy.  Doing creative activities like painting, sculpting, or playing or listening to music.  Practicing resilience. This is the ability to recover and adjust after facing challenges. Reading some resources that encourage resilience may help you to learn ways to practice those behaviors.     General instructions  Be patient with yourself and others. Allow the grieving process to happen, and remember that grieving takes time.  It is likely that you may never feel completely done with some grief. You may find a way to move on while still cherishing memories and feelings about your loss.  Accepting your loss is a process. It can take months or longer to adjust.  Keep all follow-up visits. This is important.  Where to find support  To get support for managing loss:  Ask your health care provider for help and recommendations, such as grief counseling or therapy.  Think about joining a support group for people who are managing a loss.  Where to find more information  You can find more information  about managing loss from:  American Society of Clinical Oncology: www.cancer.net  American Psychological Association: [employer]  Contact a health care provider if:  Your grief is extreme and keeps getting worse.  You have ongoing grief that does not improve.  Your body shows symptoms of grief, such as illness.  You feel depressed, anxious, or hopeless.  Get help right away if:  You have thoughts about hurting yourself or others.  Get help right away if you feel like you may hurt yourself or others, or have thoughts about taking your own life. Go to your nearest emergency room or:  Call 911.  Call the  National Suicide Prevention Lifeline at 985-787-5965 or 988. This is open 24 hours a day.  Text the Crisis Text Line at (308)798-1395.  Summary  Grief is the result of a major change or an absence of someone or something that you count on. Grief is a normal reaction to loss.  The depth of grief and the period of recovery depend on the type of loss and your ability to adjust to the change and process your feelings.  Processing grief requires patience and a willingness to accept your feelings and talk about your loss with people who are supportive.  It is important to find resources that work for you and to realize that people experience grief differently. There is not one grieving process that works for everyone in the same way.  Be aware that when grief becomes extreme, it can lead to more severe issues like isolation, depression, anxiety, or suicidal thoughts. Talk with your health care provider if you have any of these issues.  This information is not intended to replace advice given to you by your health care provider. Make sure you discuss any questions you have with your health care provider.  Document Revised: 07/06/2021 Document Reviewed: 07/06/2021  Elsevier Patient Education  2024 [employer].

## 2024-06-13 ENCOUNTER — Encounter: Payer: Self-pay | Admitting: Family Medicine

## 2024-06-13 ENCOUNTER — Ambulatory Visit: Payer: Self-pay | Admitting: Family Medicine

## 2024-06-13 LAB — CMP14+EGFR
ALT: 10 IU/L (ref 0–32)
AST: 15 IU/L (ref 0–40)
Albumin: 4.4 g/dL (ref 3.8–4.9)
Alkaline Phosphatase: 94 IU/L (ref 44–121)
BUN/Creatinine Ratio: 20 (ref 9–23)
BUN: 15 mg/dL (ref 6–24)
Bilirubin Total: 0.2 mg/dL (ref 0.0–1.2)
CO2: 23 mmol/L (ref 20–29)
Calcium: 8.4 mg/dL — ABNORMAL LOW (ref 8.7–10.2)
Chloride: 101 mmol/L (ref 96–106)
Creatinine, Ser: 0.74 mg/dL (ref 0.57–1.00)
Globulin, Total: 2.7 g/dL (ref 1.5–4.5)
Glucose: 98 mg/dL (ref 70–99)
Potassium: 4.2 mmol/L (ref 3.5–5.2)
Sodium: 140 mmol/L (ref 134–144)
Total Protein: 7.1 g/dL (ref 6.0–8.5)
eGFR: 94 mL/min/1.73 (ref >59)

## 2024-06-13 LAB — LP+NON-HDL CHOLESTEROL
Cholesterol, Total: 239 mg/dL — ABNORMAL HIGH (ref 100–199)
HDL: 56 mg/dL (ref >39)
LDL Chol Calc (NIH): 163 mg/dL — ABNORMAL HIGH (ref 0–99)
Total Non-HDL-Chol (LDL+VLDL): 183 mg/dL — ABNORMAL HIGH (ref 0–129)
Triglycerides: 115 mg/dL (ref 0–149)
VLDL Cholesterol Cal: 20 mg/dL (ref 5–40)

## 2024-06-13 LAB — T4, FREE: Free T4: 1.51 ng/dL (ref 0.82–1.77)

## 2024-06-13 LAB — POCT GLYCOSYLATED HEMOGLOBIN (HGB A1C): Hemoglobin A1C: 5.9 % — AB (ref 4.0–5.6)

## 2024-06-13 LAB — T3: T3, Total: 78 ng/dL (ref 71–180)

## 2024-06-13 LAB — TSH: TSH: 5.71 u[IU]/mL — ABNORMAL HIGH (ref 0.450–4.500)

## 2024-06-13 MED ORDER — LEVOTHYROXINE SODIUM 125 MCG PO TABS: 125.0000 ug | ORAL_TABLET | Freq: Every day | ORAL | 1 refills | Status: DC

## 2024-06-13 MED ORDER — ROSUVASTATIN CALCIUM 40 MG PO TABS: 40.0000 mg | ORAL_TABLET | Freq: Every day | ORAL | 1 refills | Status: DC

## 2024-06-18 ENCOUNTER — Other Ambulatory Visit: Payer: Self-pay | Admitting: Licensed Clinical Social Worker

## 2024-06-18 ENCOUNTER — Other Ambulatory Visit: Payer: Self-pay

## 2024-06-18 NOTE — Patient Instructions (Addendum)
 Visit Information  Thank you for taking time to visit with me today. Please don't hesitate to contact me if I can be of assistance to you before our next scheduled appointment.  Our next appointment is by telephone on 07/25/24 at 10:00 AM   Please call the care guide team at [phone] if you need to cancel or reschedule your appointment.   Following is a copy of your care plan:   Goals Addressed             This Visit's Progress    VBCI Social Work Care Plan       Problems:   Grief Issues faced since death of her son in 2023/03/01             Right shoulder pain issues and movement challenges             Financial challenges             Counseling needs             Sleeping difficulty  CSW Clinical Goal(s):   Over the next 30  days the Patient will attend all scheduled medical appointments as evidenced by patient report and care team review of appointment completion in electronic medical record.               Client to call [facility] in next 30 days to talk with agency about counseling support services (agency number provided to client:  [phone]) AEB client report of contacting that agency for possible counseling support                Interventions:  Spoke with client about her current medical needs and emotional needs             Discussed grief issues of client. Her son died in Mar 01, 2023.  Client had been getting some previous grief counseling support with [facility].  Client said she is no longer getting grief support with [facility].  LCSW provided client with name and phone number of [facility] in [place], [place].  Client plans to call that agency to speak with agency about possible counseling support            Discussed pain issues of client. [facility] to [facility]. Talked with client about sleeping issues. She has decreased sleep.              Discussed client support with PCP              Discussed medication procurement. Discussed insurance coverage of client            Discussed client support from her spouse.  Discussed transport issues. Client said she has no transport problems at present.             Discussed ambulation. She said she gets fatigued occasionally in walking             Provided counseling support for client. Discussed grief symptoms experienced by client             Discussed program support with RN, LCSW, Pharmacist             Discussed movement challenges of client related to use of her right sghoulder. She sometimes has to have some help in getting dressed             Encouraged client to call LCSW as needed  for SW support at [phone]             Client was appreciative of call from LCSW today  Patient Goals/Self-Care Activities:  Connect with provider for ongoing mental health treatment.   Continue taking your medication as prescribed.   Attend scheduled appointments with PCP Practice self care and allow time for ADLs completion Call LCSW as needed for SW support Use coping skills to help reduced stress and manage symptoms faced  Plan:   Telephone follow up appointment with care management team member scheduled for:  07/25/24 at 10:00 AM        Please go to [facility] [address], [place] [phone]) if you are experiencing a Mental Health or Behavioral Health Crisis or need someone to talk to.  The patient verbalized understanding of instructions, educational materials, and care plan provided today and DECLINED offer to receive copy of patient instructions, educational materials, and care plan.    Meagan Flores  MSW, LCSW [facility] Licensed Clinical Social Worker Direct Dial:  [phone] Fax:  [phone] Website:  [email or web address]

## 2024-06-18 NOTE — Patient Outreach (Signed)
 Complex Care Management   Visit Note  06/18/2024  Name:  Meagan Flores MRN: [id number] DOB: [birth year]  Situation: Referral received for Complex Care Management related to grief issues faced I obtained verbal consent from Patient.  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   Prediabetes    Preop cardiovascular exam 11/25/2020   Pulmonary hypertension, unspecified (HCC) 11/25/2020   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   Rotator cuff impingement syndrome of right shoulder 09/30/2020   Skin rash 02/17/2021   Stroke [facility])    Tendinopathy of right biceps tendon 09/30/2020   Thyroid  disease    TIA (transient ischemic attack) 04/15/2018   Tobacco use 09/28/2016    Assessment: Patient Reported Symptoms:  Cognitive Cognitive Status: Alert and oriented to person, place, and time Cognitive/Intellectual Conditions Management [RPT]: None reported or documented in medical history or problem list   Health Maintenance Behaviors: Hobbies, Stress management Health Facilitated by: Rest, Stress management  Neurological Neurological Review of Symptoms: Headaches, Weakness Neurological Management Strategies: Coping strategies, Adequate rest  HEENT HEENT Symptoms Reported: Sudden change or loss of vision HEENT Management Strategies: Adequate rest, Coping strategies    Cardiovascular Cardiovascular Symptoms  Reported: Fatigue Does patient have uncontrolled Hypertension?: Yes Is patient checking Blood Pressure at home?: No Cardiovascular Management Strategies: Adequate rest, Coping strategies  Respiratory Respiratory Symptoms Reported: Shortness of breath Respiratory Management Strategies: Adequate rest, Coping strategies  Endocrine Endocrine Symptoms Reported: Blurry vision, Shakiness, Weakness or fatigue    Gastrointestinal Gastrointestinal Symptoms Reported: Change in appetite Gastrointestinal Management Strategies: Adequate rest, Coping strategies    Genitourinary Genitourinary Symptoms Reported: Frequency Genitourinary Management Strategies: Adequate rest, Coping strategies  Integumentary Integumentary Symptoms Reported: Itching, Skin changes Skin Management Strategies: Adequate rest  Musculoskeletal Musculoskelatal Symptoms Reviewed: Muscle pain, Joint pain, Weakness Additional Musculoskeletal Details: shoulder issues in right shoulder Musculoskeletal Management Strategies: Adequate rest, Coping strategies      Psychosocial Psychosocial Symptoms Reported: Anxiety - if selected complete GAD, Sadness - if selected complete PHQ 2-9 Behavioral Management Strategies: Activity, Adequate rest, Coping strategies Major Change/Loss/Stressor/Fears (CP): Death of a loved one, Medical condition, self Techniques to Cope with Loss/Stress/Change: Medication Quality of Family Relationships: supportive Do you feel physically threatened by others?: No      06/18/2024    1:28 PM  Depression screen PHQ 2/9  Decreased Interest 1  Down, Depressed, Hopeless 1  PHQ - 2 Score 2  Altered sleeping 1  Tired, decreased energy 2  Change in appetite 1  Feeling bad or failure about yourself  1  Trouble concentrating 1  Moving slowly or fidgety/restless 1  Suicidal thoughts 0  PHQ-9 Score 9  Difficult doing work/chores Somewhat difficult    Vitals:  BP of client is being monitored through PCP office of  client  Medications Reviewed Today     Reviewed by Frances Ozell GORMAN KEN (Social Worker) on 06/18/24 at 1312  Med List Status: <None>  Medication Order Taking? Sig Documenting Provider Last Dose Status Informant  acetaminophen -codeine  (TYLENOL  #3) 300-30 MG tablet [id number] Yes Take 1 tablet by mouth every 4 (four) hours as needed for moderate pain or severe pain. Brien Belvie BRAVO, MD  Active              aspirin  EC 81 MG tablet [id number] Yes Take 1 tablet (81 mg total) by mouth daily. Brien Belvie BRAVO, MD  Active   buPROPion  (WELLBUTRIN  XL) 150 MG 24 hr tablet [id number] Yes Take 1 tablet (150 mg total) by mouth daily. For smoking cessation Newlin, Enobong, MD  Active   clotrimazole  (LOTRIMIN ) 1 % cream [id number] Yes Apply 1 Application topically 2 (two) times daily. Newlin, Enobong, MD  Active   cyclobenzaprine  (FLEXERIL ) 10 MG tablet [id number] Yes Take 1 tablet (10 mg total) by mouth at bedtime. Newlin, Enobong, MD  Active   escitalopram  (LEXAPRO ) 10 MG tablet [id number] Yes Take 1 tablet (10 mg total) by mouth daily. Newlin, Enobong, MD  Active   estradiol  (ESTRACE  VAGINAL) 0.1 MG/GM vaginal cream [id number] Yes Apply a pea size amount to vulva twice a week Newlin, Enobong, MD  Active   ezetimibe  (ZETIA ) 10 MG tablet [id number] Yes Take 1 tablet (10 mg total) by mouth daily. Brien Belvie BRAVO, MD  Active   levothyroxine  (EUTHYROX ) 125 MCG tablet [id number] Yes Take 1 tablet (125 mcg total) by mouth daily before breakfast. Delbert Clam, MD  Active   lidocaine  (LIDODERM ) 5 % [id number] Yes Place 1 patch onto the skin daily. Remove & Discard patch within 12 hours or as directed by MD Delbert Clam, MD  Active   metoprolol  succinate (TOPROL  XL) 25 MG 24 hr tablet [id number] Yes Take 1 tablet (25 mg total) by mouth daily. Newlin, Enobong, MD  Active   naproxen  (NAPROSYN ) 500 MG tablet [id number] Yes Take 1 tablet (500 mg total) by mouth 2 (two) times daily with a meal. Delbert Clam, MD   Active   nicotine  polacrilex (NICORETTE  MINI) 4 MG lozenge [id number] Yes Use two times a day to reduce tobacco use Brien Belvie BRAVO, MD  Active   rosuvastatin  (CRESTOR ) 40 MG tablet [id number] Yes Take 1 tablet (40 mg total) by mouth daily. Newlin, Enobong, MD  Active   traZODone  (DESYREL ) 100 MG tablet [id number] Yes Take 1 tablet (100 mg total) by mouth at bedtime as needed for sleep. Newlin, Enobong, MD  Active   triamcinolone  cream (KENALOG ) 0.1 % [id number] Yes Apply 1 Application topically 2 (two) times daily. Newlin, Enobong, MD  Active             Recommendation:   PCP Follow-up Continue Current Plan of Care Take medications as prescribed Call [facility] agency to discuss possible counseling support services available for client Call LCSW as needed for SW support Allow time for self care and ADLs completion Use coping skills to help manage grief and anxiety issues  Follow Up Plan:   LCSW to call client on 07/25/24 at 10:00 AM   Glendia Pear  MSW, LCSW [place]/Value Based Care [facility] Licensed Clinical Social Worker Direct Dial:  [phone] Fax:  [phone] Website:  [email or web address]

## 2024-07-10 ENCOUNTER — Ambulatory Visit: Payer: Commercial Managed Care - HMO | Admitting: Family Medicine

## 2024-07-25 ENCOUNTER — Other Ambulatory Visit: Payer: Self-pay | Admitting: Licensed Clinical Social Worker

## 2024-07-25 ENCOUNTER — Telehealth: Payer: Self-pay

## 2024-07-25 NOTE — Patient Instructions (Signed)
 Visit Information  Thank you for taking time to visit with me today. Please don't hesitate to contact me if I can be of assistance to you before our next scheduled appointment.  Our next appointment is by telephone on 09/10/2024 at 10:30 AM   Please call the care guide team at [phone] if you need to cancel or reschedule your appointment.   Following is a copy of your care plan:   Goals Addressed             This Visit's Progress    VBCI Social Work Care Plan       Problems:   Grief Issues faced since death of her son in 2023-02-12             Right shoulder pain issues and movement challenges             Financial challenges             Counseling needs             Sleeping difficulty  CSW Clinical Goal(s):   Over the next 30  days the Patient will attend all scheduled medical appointments as evidenced by patient report and care team review of appointment completion in electronic medical record.               Client to call [facility] in [place], [place] in next 30 days  to talk with agency about counseling support services through that agency AEB patient report of contacting [facility] to inquire about counseling support for client               Interventions:  Spoke with client about her current medical needs and emotional needs             Discussed grief issues of client. Her son died in 02/12/23.  Client had been getting some previous grief counseling support with [facility].  Client said she is no longer getting grief support with [facility].  LCSW provided client with information about [facility] in [place], [place] and encouraged [place] to call [facility] to inquire about possible counseling support services for client               Discussed pain issues of client. Client spoke of pain in her right shoulder.               Talked with client about sleeping issues. She has decreased sleep.                 Discussed  client support with PCP                Discussed medication procurement. Discussed insurance coverage of client               Discussed client support from her spouse.  Discussed transport issues. Client said she has no transport problems at present.             Discussed ambulation. She said she gets fatigued occasionally in walking             Provided counseling support for client. Discussed grief symptoms experienced by client             Discussed program support with RN, LCSW, Pharmacist             Discussed movement challenges of client related to use of her right sghoulder. She sometimes has to have some  help in getting dressed             Reviewed mood issues. She has sad mood occasionally . LCSW talked with client about possible benefits of her receiving counseling support. She had previously  received some counseling support through [facility]             Encouraged client to call LCSW as needed for SW support at [phone]             Client was appreciative of call from LCSW today  Patient Goals/Self-Care Activities:  Connect with provider for ongoing mental health treatment.   Continue taking your medication as prescribed.   Attend scheduled appointments with PCP Practice self care and allow time for ADLs completion Call LCSW as needed for SW support Use coping skills to help reduced stress and manage symptoms faced Call [facility] in [place], [place] to inquire with agency about possible counseling support for client   Plan:   Telephone follow up appointment with care management team member scheduled for:  09/10/24 at 10:30 AM        Please go to [facility] [address] [place] [phone]) if you are experiencing a Mental Health or Behavioral Health Crisis or need someone to talk to.  The patient verbalized understanding of instructions, educational materials, and care plan provided today and DECLINED offer to  receive copy of patient instructions, educational materials, and care plan.    Meagan Flores  MSW, LCSW /Value Based Care [facility] Licensed Clinical Social Worker Direct Dial:  [phone] Fax:  [phone] Website:  [email or web address]

## 2024-07-25 NOTE — Telephone Encounter (Signed)
 Pharmacy Patient Advocate Encounter   Received notification from CoverMyMeds that prior authorization for LIDOCAINE  5% PATCH is required/requested.   Insurance verification completed.   The patient is insured through [facility] MEDICAID .   Per test claim: PA required; PA submitted to above mentioned insurance via [facility] Tracks Key/confirmation #/EOC [id number] W Status is pending

## 2024-07-25 NOTE — Patient Outreach (Signed)
 Complex Care Management   Visit Note  07/25/2024  Name:  Meagan Flores MRN: [id number] DOB: 1966-03-31  Situation: Referral received for Complex Care Management related to grief issues faced  I obtained verbal consent from Patient.  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   Prediabetes    Preop cardiovascular exam 11/25/2020   Pulmonary hypertension, unspecified (HCC) 11/25/2020   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   Rotator cuff impingement syndrome of right shoulder 09/30/2020   Skin rash 02/17/2021   Stroke [facility])    Tendinopathy of right biceps tendon 09/30/2020   Thyroid  disease    TIA (transient ischemic attack) 04/15/2018   Tobacco use 09/28/2016    Assessment: Patient Reported Symptoms:  Cognitive Cognitive Status: Alert and oriented to person, place, and time Cognitive/Intellectual Conditions Management [RPT]: None reported or documented in medical history or problem list   Health Maintenance Behaviors: Hobbies, Stress management Health Facilitated by: Stress management  Neurological Neurological Review of Symptoms: Headaches, Weakness Neurological Management Strategies: Coping strategies, Adequate rest  HEENT HEENT Symptoms Reported: Sudden change or loss of vision HEENT Management Strategies: Coping strategies  Uses reading glasses as needed  Cardiovascular Cardiovascular  Symptoms Reported: Fatigue Does patient have uncontrolled Hypertension?: Yes Is patient checking Blood Pressure at home?: No Cardiovascular Management Strategies: Adequate rest, Coping strategies  Respiratory Respiratory Symptoms Reported: Shortness of breath, Dry cough Other Respiratory Symptoms: occasional cough Respiratory Management Strategies: Coping strategies. Client smokes regulaly  Endocrine Endocrine Symptoms Reported: Blurry vision, Shakiness, Weakness or fatigue    Gastrointestinal Gastrointestinal Symptoms Reported: Change in appetite Gastrointestinal Management Strategies: Adequate rest, Coping strategies    Genitourinary Genitourinary Symptoms Reported: Frequency Genitourinary Management Strategies: Adequate rest, Coping strategies  Integumentary Integumentary Symptoms Reported: Itching, Skin changes Skin Management Strategies: Adequate rest  Musculoskeletal Musculoskelatal Symptoms Reviewed: Muscle pain, Joint pain Additional Musculoskeletal Details: shoulder issues in right shoulder Musculoskeletal Management Strategies: Coping strategies      Psychosocial Psychosocial Symptoms Reported: Anxiety - if selected complete GAD, Sadness - if selected complete PHQ 2-9, Depression - if selected complete PHQ 2-9 Behavioral Management Strategies: Adequate rest, Coping strategies Major Change/Loss/Stressor/Fears (CP): Death of a loved one, Medical condition, self Techniques to Cope with Loss/Stress/Change: Counseling, Support group Quality of Family Relationships: supportive Do you feel physically threatened by others?: No    07/25/2024    PHQ2-9 Depression Screening   Little interest or pleasure in doing things Several days  Feeling down, depressed, or hopeless Several days  PHQ-2 - Total Score 2  Trouble falling or staying asleep, or sleeping too much Several days  Feeling tired or having little energy Several days  Poor appetite or overeating  Several days  Feeling bad  about yourself - or that you are a failure or have let yourself or your family down Several days  Trouble concentrating on things, such as reading the newspaper or watching television Several days  Moving or  speaking so slowly that other people could have noticed.  Or the opposite - being so fidgety or restless that you have been moving around a lot more than usual Several days  Thoughts that you would be better off dead, or hurting yourself in some way Not at all  PHQ2-9 Total Score 8  If you checked off any problems, how difficult have these problems made it for you to do your work, take care of things at home, or get along with other people Somewhat difficult  Depression Interventions/Treatment Medication, Counseling    Vitals:   Client said that her BP readings sometimes run a little high  Medications Reviewed Today     Reviewed by Frances Ozell GORMAN KEN (Social Worker) on 07/25/24 at [phone]  Med List Status: <None>   Medication Order Taking? Sig Documenting Provider Last Dose Status Informant  acetaminophen -codeine  (TYLENOL  #3) 300-30 MG tablet [id number] Yes Take 1 tablet by mouth every 4 (four) hours as needed for moderate pain or severe pain. Brien Belvie BRAVO, MD  Active              aspirin  EC 81 MG tablet [id number] Yes Take 1 tablet (81 mg total) by mouth daily. Brien Belvie BRAVO, MD  Active   buPROPion  (WELLBUTRIN  XL) 150 MG 24 hr tablet [id number] Yes Take 1 tablet (150 mg total) by mouth daily. For smoking cessation Newlin, Enobong, MD  Active   clotrimazole  (LOTRIMIN ) 1 % cream [id number] Yes Apply 1 Application topically 2 (two) times daily. Newlin, Enobong, MD  Active   cyclobenzaprine  (FLEXERIL ) 10 MG tablet [id number] Yes Take 1 tablet (10 mg total) by mouth at bedtime. Newlin, Enobong, MD  Active   escitalopram  (LEXAPRO ) 10 MG tablet [id number] Yes Take 1 tablet (10 mg total) by mouth daily. Newlin, Enobong, MD  Active   estradiol  (ESTRACE  VAGINAL) 0.1 MG/GM vaginal cream  [id number] Yes Apply a pea size amount to vulva twice a week Newlin, Enobong, MD  Active   ezetimibe  (ZETIA ) 10 MG tablet [id number] Yes Take 1 tablet (10 mg total) by mouth daily. Brien Belvie BRAVO, MD  Active   levothyroxine  (EUTHYROX ) 125 MCG tablet [id number] Yes Take 1 tablet (125 mcg total) by mouth daily before breakfast. Delbert Clam, MD  Active   lidocaine  (LIDODERM ) 5 % [id number] Yes Place 1 patch onto the skin daily. Remove & Discard patch within 12 hours or as directed by MD Delbert Clam, MD  Active   metoprolol  succinate (TOPROL  XL) 25 MG 24 hr tablet [id number] Yes Take 1 tablet (25 mg total) by mouth daily. Newlin, Enobong, MD  Active   naproxen  (NAPROSYN ) 500 MG tablet [id number] Yes Take 1 tablet (500 mg total) by mouth 2 (two) times daily with a meal. Delbert Clam, MD  Active   nicotine  polacrilex (NICORETTE  MINI) 4 MG lozenge [id number] Not taking  Use two times a day to reduce tobacco use  Patient not taking: Reported on 07/25/2024   Brien Belvie BRAVO, MD  Active   rosuvastatin  (CRESTOR ) 40 MG tablet [id number] Yes Take 1 tablet (40 mg total) by mouth daily. Newlin, Enobong, MD  Active   traZODone  (DESYREL ) 100 MG tablet [id number] Yes Take 1 tablet (100 mg total) by mouth at bedtime as needed for sleep. Newlin, Enobong, MD  Active   triamcinolone  cream (KENALOG ) 0.1 % [id number] yes Apply 1 Application topically 2 (two) times daily. Newlin, Enobong, MD  Active             Recommendation:  PCP Follow-up Continue Current Plan of Care Take medications as prescribed Call [facility] in [place] to inquire with agency about possible counseling support for client Call LCSW as needed for SW support  Follow Up Plan:   Telephone follow up appointment date/time:  09/10/24 at 10:30 AM    Glendia Pear  MSW, LCSW [place]/Value Based Care [facility] Licensed Clinical Social Worker Direct Dial:  [phone] Fax:  [phone] Website:   [email or web address]

## 2024-07-27 ENCOUNTER — Telehealth: Payer: Self-pay

## 2024-07-27 NOTE — Telephone Encounter (Signed)
 Pharmacy Patient Advocate Encounter  Received notification from [employer] MEDICAID that Prior Authorization for LIDOCAINE  5% PATCH has been APPROVED from 07/25/2024 to 07/25/2025   PA #/Case ID/Reference #: [id number]

## 2024-08-24 ENCOUNTER — Other Ambulatory Visit: Payer: Self-pay | Admitting: Family Medicine

## 2024-08-24 DIAGNOSIS — Z1231 Encounter for screening mammogram for malignant neoplasm of breast: Secondary | ICD-10-CM

## 2024-08-29 ENCOUNTER — Ambulatory Visit
Admission: RE | Admit: 2024-08-29 | Discharge: 2024-08-29 | Disposition: A | Discharge status: Home / Self Care | Source: Ambulatory Visit | Attending: Family Medicine | Admitting: Family Medicine

## 2024-08-29 DIAGNOSIS — Z1231 Encounter for screening mammogram for malignant neoplasm of breast: Secondary | ICD-10-CM

## 2024-08-31 ENCOUNTER — Ambulatory Visit: Payer: Self-pay | Admitting: Family Medicine

## 2024-09-10 ENCOUNTER — Other Ambulatory Visit: Payer: Self-pay | Admitting: Licensed Clinical Social Worker

## 2024-09-12 ENCOUNTER — Ambulatory Visit: Admitting: Family Medicine

## 2024-09-28 ENCOUNTER — Ambulatory Visit: Attending: Family Medicine | Admitting: Family Medicine

## 2024-09-28 ENCOUNTER — Encounter: Payer: Self-pay | Admitting: Family Medicine

## 2024-09-28 ENCOUNTER — Telehealth: Payer: Self-pay

## 2024-09-28 VITALS — BP 138/85 | HR 75 | Temp 98.3°F | Ht 68.0 in | Wt 198.8 lb

## 2024-09-28 DIAGNOSIS — F17210 Nicotine dependence, cigarettes, uncomplicated: Secondary | ICD-10-CM | POA: Insufficient documentation

## 2024-09-28 DIAGNOSIS — Z8616 Personal history of COVID-19: Secondary | ICD-10-CM | POA: Insufficient documentation

## 2024-09-28 DIAGNOSIS — G8929 Other chronic pain: Secondary | ICD-10-CM | POA: Diagnosis not present

## 2024-09-28 DIAGNOSIS — F32A Depression, unspecified: Secondary | ICD-10-CM | POA: Diagnosis not present

## 2024-09-28 DIAGNOSIS — E782 Mixed hyperlipidemia: Secondary | ICD-10-CM

## 2024-09-28 DIAGNOSIS — Z91148 Patient's other noncompliance with medication regimen for other reason: Secondary | ICD-10-CM | POA: Diagnosis not present

## 2024-09-28 DIAGNOSIS — F329 Major depressive disorder, single episode, unspecified: Secondary | ICD-10-CM

## 2024-09-28 DIAGNOSIS — Z8673 Personal history of transient ischemic attack (TIA), and cerebral infarction without residual deficits: Secondary | ICD-10-CM | POA: Insufficient documentation

## 2024-09-28 DIAGNOSIS — Z634 Disappearance and death of family member: Secondary | ICD-10-CM | POA: Diagnosis not present

## 2024-09-28 DIAGNOSIS — T447X6A Underdosing of beta-adrenoreceptor antagonists, initial encounter: Secondary | ICD-10-CM | POA: Insufficient documentation

## 2024-09-28 DIAGNOSIS — E785 Hyperlipidemia, unspecified: Secondary | ICD-10-CM | POA: Insufficient documentation

## 2024-09-28 DIAGNOSIS — I10 Essential (primary) hypertension: Secondary | ICD-10-CM | POA: Insufficient documentation

## 2024-09-28 DIAGNOSIS — F419 Anxiety disorder, unspecified: Secondary | ICD-10-CM | POA: Insufficient documentation

## 2024-09-28 DIAGNOSIS — G4700 Insomnia, unspecified: Secondary | ICD-10-CM | POA: Diagnosis not present

## 2024-09-28 DIAGNOSIS — E039 Hypothyroidism, unspecified: Secondary | ICD-10-CM | POA: Diagnosis not present

## 2024-09-28 DIAGNOSIS — M79601 Pain in right arm: Secondary | ICD-10-CM | POA: Diagnosis present

## 2024-09-28 DIAGNOSIS — M7501 Adhesive capsulitis of right shoulder: Secondary | ICD-10-CM | POA: Diagnosis not present

## 2024-09-28 DIAGNOSIS — L84 Corns and callosities: Secondary | ICD-10-CM

## 2024-09-28 DIAGNOSIS — Z79899 Other long term (current) drug therapy: Secondary | ICD-10-CM | POA: Insufficient documentation

## 2024-09-28 DIAGNOSIS — Z59869 Financial insecurity, unspecified: Secondary | ICD-10-CM | POA: Insufficient documentation

## 2024-09-28 DIAGNOSIS — Z720 Tobacco use: Secondary | ICD-10-CM

## 2024-09-28 DIAGNOSIS — Z1211 Encounter for screening for malignant neoplasm of colon: Secondary | ICD-10-CM

## 2024-09-28 DIAGNOSIS — E890 Postprocedural hypothyroidism: Secondary | ICD-10-CM | POA: Diagnosis not present

## 2024-09-28 DIAGNOSIS — Z79890 Hormone replacement therapy (postmenopausal): Secondary | ICD-10-CM | POA: Diagnosis not present

## 2024-09-28 MED ORDER — ROSUVASTATIN CALCIUM 40 MG PO TABS: 40.0000 mg | ORAL_TABLET | Freq: Every day | ORAL | 1 refills | Status: AC

## 2024-09-28 MED ORDER — TIZANIDINE HCL 4 MG PO TABS
4.0000 mg | ORAL_TABLET | Freq: Three times a day (TID) | ORAL | 1 refills | Status: AC | PRN
Start: 2024-09-28 — End: ?

## 2024-09-28 MED ORDER — BUPROPION HCL ER (XL) 150 MG PO TB24: 150.0000 mg | ORAL_TABLET | Freq: Every day | ORAL | 1 refills | Status: AC

## 2024-09-28 MED ORDER — ESCITALOPRAM OXALATE 10 MG PO TABS: 10.0000 mg | ORAL_TABLET | Freq: Every day | ORAL | 1 refills | Status: AC

## 2024-09-28 MED ORDER — TRAZODONE HCL 100 MG PO TABS
100.0000 mg | ORAL_TABLET | Freq: Every evening | ORAL | 1 refills | Status: AC | PRN
Start: 2024-09-28 — End: ?

## 2024-09-28 NOTE — Progress Notes (Signed)
 Subjective:  Patient ID: Meagan Flores, female    DOB: Dec 19, 1965  Age: 58 y.o. MRN: [id number]  CC: Medical Management of Chronic Issues (Right arm pain/Pain in feet requesting referral )     Discussed the use of AI scribe software for clinical note transcription with the patient, who gave verbal consent to proceed.  History of Present Illness Meagan Flores is a 58 year old female with a history of hyperlipidemia, stroke, major depressive disorder, hypothyroidism, hypertension (diet-controlled), pulmonary hypertension, right shoulder adhesive capsulitis (status post previous right shoulder surgeries), nicotine  dependence, Prediabetes who presents for follow-up on her right arm pain and smoking cessation.  She experiences persistent right arm pain, described as severe and sometimes leading to headaches. She uses pain patches and Goody's Powder for relief. She discontinued Flexeril  after a recall notice.  She is actively trying to quit smoking, using Wellbutrin  and patches. Her smoking is influenced by stress and grief following her son's death. Her daughter is using the car she typically smokes in, which is aiding her cessation efforts.  She is on levothyroxine  for hypothyroidism and is due for a thyroid  level recheck. She takes Crestor  40 mg daily for high cholesterol.  She experiences depression and anxiety, managed with Lexapro , and uses trazodone  for sleep, which is effective.  I had referred her to [facility] for counseling for anxiety and depression and bereavement but she is yet to hear from them.She has a small social circle and has been isolating herself since her son's death.  She has not been taking metoprolol , as she monitors her blood pressure at home and has made dietary changes. She reports right foot pain due to a callus, affecting her mobility.     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   Prediabetes    Preop cardiovascular exam 11/25/2020   Pulmonary hypertension, unspecified (HCC) 11/25/2020   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   Rotator cuff impingement syndrome of right shoulder 09/30/2020   Skin rash 02/17/2021   Stroke [facility])    Tendinopathy of right biceps tendon 09/30/2020   Thyroid  disease    TIA (transient ischemic attack) 04/15/2018   Tobacco use 09/28/2016    Past Surgical History:  Procedure Laterality Date   BICEPT TENODESIS Right 12/16/2021   Procedure: RIGHT BICEPS TENODESIS;  Surgeon: Jerri Kay HERO, MD;  Location: [facility];  Service: Orthopedics;  Laterality: Right;   PARTIAL HYSTERECTOMY     right arm sx     SHOULDER ARTHROSCOPY WITH BICEPSTENOTOMY Right 01/21/2021   Procedure: SHOULDER ARTHROSCOPY WITH BICEPSTENOTOMY;  Surgeon: Jerri Kay HERO, MD;  Location: [facility];  Service: Orthopedics;  Laterality: Right;   SHOULDER ARTHROSCOPY WITH DISTAL CLAVICLE RESECTION Right 01/12/2017   Procedure: RIGHT SHOULDER ARTHROSCOPY WITH  SUBACROMIAL DECOMPRESSION, DISTAL CLAVICLE EXCISION, extensive debridement;  Surgeon: Kay HERO Jerri, MD;  Location:  SURGERY CENTER;  Service: Orthopedics;  Laterality: Right;   THYMECTOMY  2004   THYROIDECTOMY  Family History  Problem Relation Age of Onset   Stomach cancer Sister    Colon polyps Sister    Esophageal cancer Neg Hx    Rectal cancer Neg Hx    Colon cancer Neg Hx    Breast cancer Neg Hx     Social History    Socioeconomic History   Marital status: Married    Spouse name: Not on file   Number of children: Not on file   Years of education: Not on file   Highest education level: Not on file  Occupational History   Not on file  Tobacco Use   Smoking status: Every Day    Current packs/day: 0.50    Average packs/day: 0.5 packs/day for 34.0 years (17.0 ttl pk-yrs)    Types: Cigarettes   Smokeless tobacco: Never  Vaping Use   Vaping status: Never Used  Substance and Sexual Activity   Alcohol use: Yes    Comment: occassional   Drug use: No   Sexual activity: Not on file  Other Topics Concern   Not on file  Social History Narrative   Not on file   Social Drivers of Health   Financial Resource Strain: Medium Risk (10/11/2023)   Overall Financial Resource Strain (CARDIA)    Difficulty of Paying Living Expenses: Somewhat hard  Food Insecurity: Food Insecurity Present (06/18/2024)   Hunger Vital Sign    Worried About Running Out of Food in the Last Year: Sometimes true    Ran Out of Food in the Last Year: Sometimes true  Transportation Needs: No Transportation Needs (06/18/2024)   PRAPARE - Administrator, Civil Service (Medical): No    Lack of Transportation (Non-Medical): No  Physical Activity: Inactive (07/25/2024)   Exercise Vital Sign    Days of Exercise per Week: 0 days    Minutes of Exercise per Session: 0 min  Stress: Stress Concern Present (07/25/2024)   [employer] of Occupational Health - Occupational Stress Questionnaire    Feeling of Stress: To some extent  Social Connections: Unknown (10/11/2023)   Social Connection and Isolation Panel    Frequency of Communication with Friends and Family: Three times a week    Frequency of Social Gatherings with Friends and Family: Never    Attends Religious Services: Not on Marketing Executive or Organizations: No    Attends Engineer, Structural: Not on file    Marital Status: Married     Allergies  Allergen Reactions   Sulfa Antibiotics Hives    Outpatient Medications Prior to Visit  Medication Sig Dispense Refill   acetaminophen -codeine  (TYLENOL  #3) 300-30 MG tablet Take 1 tablet by mouth every 4 (four) hours as needed for moderate pain or severe pain. 60 tablet 1   aspirin  EC 81 MG tablet Take 1 tablet (81 mg total) by mouth daily. 60 tablet 3   clotrimazole  (LOTRIMIN ) 1 % cream Apply 1 Application topically 2 (two) times daily. 30 g 0   estradiol  (ESTRACE  VAGINAL) 0.1 MG/GM vaginal cream Apply a pea size amount to vulva twice a week 42.5 g 1   ezetimibe  (ZETIA ) 10 MG tablet Take 1 tablet (10 mg total) by mouth daily. 90 tablet 3   levothyroxine  (EUTHYROX ) 125 MCG tablet Take 1 tablet (125 mcg total) by mouth daily before breakfast. 90 tablet 1   lidocaine  (LIDODERM ) 5 % Place 1 patch onto the skin daily. Remove & Discard patch within 12 hours or as directed  by MD 90 patch 1   metoprolol  succinate (TOPROL  XL) 25 MG 24 hr tablet Take 1 tablet (25 mg total) by mouth daily. 90 tablet 1   naproxen  (NAPROSYN ) 500 MG tablet Take 1 tablet (500 mg total) by mouth 2 (two) times daily with a meal. 60 tablet 1   triamcinolone  cream (KENALOG ) 0.1 % Apply 1 Application topically 2 (two) times daily. 45 g 2   buPROPion  (WELLBUTRIN  XL) 150 MG 24 hr tablet Take 1 tablet (150 mg total) by mouth daily. For smoking cessation 90 tablet 1   cyclobenzaprine  (FLEXERIL ) 10 MG tablet Take 1 tablet (10 mg total) by mouth at bedtime. 90 tablet 1   escitalopram  (LEXAPRO ) 10 MG tablet Take 1 tablet (10 mg total) by mouth daily. 90 tablet 1   rosuvastatin  (CRESTOR ) 40 MG tablet Take 1 tablet (40 mg total) by mouth daily. 90 tablet 1   traZODone  (DESYREL ) 100 MG tablet Take 1 tablet (100 mg total) by mouth at bedtime as needed for sleep. 90 tablet 1   nicotine  polacrilex (NICORETTE  MINI) 4 MG lozenge Use two times a day to reduce tobacco use (Patient not taking: Reported on 09/28/2024) 100 tablet 4    No facility-administered medications prior to visit.     ROS Review of Systems  Constitutional:  Negative for activity change and appetite change.  HENT:  Negative for sinus pressure and sore throat.   Respiratory:  Negative for chest tightness, shortness of breath and wheezing.   Cardiovascular:  Negative for chest pain and palpitations.  Gastrointestinal:  Negative for abdominal distention, abdominal pain and constipation.  Genitourinary: Negative.   Musculoskeletal:        See HPI  Skin:  Positive for rash.  Psychiatric/Behavioral:  Negative for behavioral problems and dysphoric mood.     Objective:  BP 138/85   Pulse 75   Temp 98.3 F (36.8 C) (Oral)   Ht 5' 8 (1.727 m)   Wt 198 lb 12.8 oz (90.2 kg)   SpO2 98%   BMI 30.23 kg/m      09/28/2024    9:10 AM 07/25/2024    9:50 AM 06/18/2024    1:22 PM  BP/Weight  Systolic BP 138 -- --  Diastolic BP 85 -- --  Wt. (Lbs) 198.8    BMI 30.23 kg/m2        Physical Exam Constitutional:      Appearance: She is well-developed.  Cardiovascular:     Rate and Rhythm: Normal rate.     Heart sounds: Normal heart sounds. No murmur heard. Pulmonary:     Effort: Pulmonary effort is normal.     Breath sounds: Normal breath sounds. No wheezing or rales.  Chest:     Chest wall: No tenderness.  Abdominal:     General: Bowel sounds are normal. There is no distension.     Palpations: Abdomen is soft. There is no mass.     Tenderness: There is no abdominal tenderness.  Musculoskeletal:     Right lower leg: No edema.     Left lower leg: No edema.     Comments: Decreased range of motion in right shoulder limited to 70 degrees with associated pain Sole of right foot with 3 corns and anterior aspect.  Sole of left foot is normal  Neurological:     Mental Status: She is alert and oriented to person, place, and time.  Psychiatric:        Mood and Affect: Mood normal.  Latest Ref Rng & Units 06/12/2024    9:38 AM  01/11/2024   10:29 AM 09/25/2023   10:00 PM  CMP  Glucose 70 - 99 mg/dL 98  94  90   BUN 6 - 24 mg/dL 15  12  21    Creatinine 0.57 - 1.00 mg/dL 9.25  9.16  9.09   Sodium 134 - 144 mmol/L 140  144  140   Potassium 3.5 - 5.2 mmol/L 4.2  4.4  4.0   Chloride 96 - 106 mmol/L 101  100  104   CO2 20 - 29 mmol/L 23  26    Calcium  8.7 - 10.2 mg/dL 8.4  8.7    Total Protein 6.0 - 8.5 g/dL 7.1  7.1    Total Bilirubin 0.0 - 1.2 mg/dL 0.2  0.2    Alkaline Phos 44 - 121 IU/L 94  100    AST 0 - 40 IU/L 15  19    ALT 0 - 32 IU/L 10  13      Lipid Panel     Component Value Date/Time   CHOL 239 (H) 06/12/2024 0938   TRIG 115 06/12/2024 0938   HDL 56 06/12/2024 0938   CHOLHDL 4.5 (H) 02/03/2023 0957   CHOLHDL 4.5 04/15/2018 0058   VLDL 28 04/15/2018 0058   LDLCALC 163 (H) 06/12/2024 0938    CBC    Component Value Date/Time   WBC 7.1 01/20/2023 0001   RBC 5.03 01/20/2023 0001   HGB 13.9 09/25/2023 2200   HGB 12.2 01/27/2022 0905   HCT 41.0 09/25/2023 2200   HCT 39.7 01/27/2022 0905   PLT 243 01/20/2023 0001   PLT 375 01/27/2022 0905   MCV 74.8 (L) 01/20/2023 0001   MCV 75 (L) 01/27/2022 0905   MCH 23.7 (L) 01/20/2023 0001   MCHC 31.6 01/20/2023 0001   RDW 15.8 (H) 01/20/2023 0001   RDW 14.7 01/27/2022 0905   LYMPHSABS 1.0 01/20/2023 0001   LYMPHSABS 2.7 01/27/2022 0905   MONOABS 0.6 01/20/2023 0001   EOSABS 0.1 01/20/2023 0001   EOSABS 0.0 01/27/2022 0905   BASOSABS 0.0 01/20/2023 0001   BASOSABS 0.0 01/27/2022 9094    Lab Results  Component Value Date   HGBA1C 5.9 (A) 06/13/2024    Lab Results  Component Value Date   TSH 5.710 (H) 06/12/2024       Assessment & Plan  Depression and anxiety/bereavement Depression and anxiety worsened by son's loss. On Lexapro . - Continue Lexapro . - Refer to [facility] for counseling. - Encourage social engagement and support groups.  Right shoulder adhesive capsulitis Persistent shoulder pain, previously on Flexeril , now  discontinued. - Prescribe tizanidine for shoulder pain.  Corn of foot Significant pain from plantar callus, seeking specialist care. - Refer to a podiatrist.  Insomnia Trazodone  effective for sleep. - Continue trazodone .  Hypertension Not taking metoprolol , monitoring blood pressure at home, currently stable. - Hold metoprolol , consider discontinuation if stable. - Advise to report elevated blood pressure. -Counseled on blood pressure goal of less than 130/80, low-sodium, DASH diet, medication compliance, 150 minutes of moderate intensity exercise per week. Discussed medication compliance, adverse effects.   Hyperlipidemia High cholesterol in July, on Crestor  40 mg. - Check cholesterol levels today. - Low-cholesterol diet  Hypothyroidism Previously abnormal thyroid  levels, on levothyroxine . - Check thyroid  levels today.  General Health Maintenance Due for colonoscopy, concern about flu shot side effects. - Refer for colonoscopy. - Declines flu shot   Tobacco use disorder She uses  smoking to cope with stress, especially after recent family events. Attempting cessation with Wellbutrin  and nicotine  patches. - Continue Wellbutrin  and nicotine  patches. - Encourage alternative stress-relief activities. - Refer to [facility] for counseling.   Follow-up Scheduled for follow-up in six months. - Schedule follow-up in six months. - Advise to make an appointment if new concerns arise.      Meds ordered this encounter  Medications   tiZANidine (ZANAFLEX) 4 MG tablet    Sig: Take 1 tablet (4 mg total) by mouth every 8 (eight) hours as needed.    Dispense:  180 tablet    Refill:  1    Discontinue Flexeril    buPROPion  (WELLBUTRIN  XL) 150 MG 24 hr tablet    Sig: Take 1 tablet (150 mg total) by mouth daily. For smoking cessation    Dispense:  90 tablet    Refill:  1   escitalopram  (LEXAPRO ) 10 MG tablet    Sig: Take 1 tablet (10 mg total) by mouth daily.    Dispense:  90 tablet     Refill:  1   rosuvastatin  (CRESTOR ) 40 MG tablet    Sig: Take 1 tablet (40 mg total) by mouth daily.    Dispense:  90 tablet    Refill:  1    Dose increase   traZODone  (DESYREL ) 100 MG tablet    Sig: Take 1 tablet (100 mg total) by mouth at bedtime as needed for sleep.    Dispense:  90 tablet    Refill:  1    Follow-up: Return in about 6 months (around 03/28/2025) for Chronic medical conditions.       Corrina Sabin, MD, FAAFP. [facility] [place], [place] [phone]   09/28/2024, 9:52 AM

## 2024-09-28 NOTE — Progress Notes (Signed)
 Complex Care Management Note Care Guide Note  09/28/2024 Name: Meagan Flores MRN: [id number] DOB: [birth year]   Complex Care Management Outreach Attempts: An unsuccessful telephone outreach was attempted today to offer the patient information about available complex care management services.  Follow Up Plan:  Additional outreach attempts will be made to offer the patient complex care management information and services.   Encounter Outcome:  No Answer-Left voicemail  Leotis Rase [facility], [facility] Guide  Direct Dial: [phone]  Fax [phone]

## 2024-09-29 LAB — T4, FREE: Free T4: 1.64 ng/dL (ref 0.82–1.77)

## 2024-09-29 LAB — T3: T3, Total: 102 ng/dL (ref 71–180)

## 2024-09-29 LAB — LP+NON-HDL CHOLESTEROL
Cholesterol, Total: 227 mg/dL — ABNORMAL HIGH (ref 100–199)
HDL: 49 mg/dL (ref >39)
LDL Chol Calc (NIH): 162 mg/dL — ABNORMAL HIGH (ref 0–99)
Total Non-HDL-Chol (LDL+VLDL): 178 mg/dL — ABNORMAL HIGH (ref 0–129)
Triglycerides: 89 mg/dL (ref 0–149)
VLDL Cholesterol Cal: 16 mg/dL (ref 5–40)

## 2024-09-29 LAB — TSH: TSH: 1.43 u[IU]/mL (ref 0.450–4.500)

## 2024-10-01 ENCOUNTER — Ambulatory Visit: Payer: Self-pay | Admitting: Family Medicine

## 2024-10-01 DIAGNOSIS — E7841 Elevated Lipoprotein(a): Secondary | ICD-10-CM

## 2024-10-02 MED ORDER — LEVOTHYROXINE SODIUM 125 MCG PO TABS: 125.0000 ug | ORAL_TABLET | Freq: Every day | ORAL | 1 refills | Status: DC

## 2024-10-02 NOTE — Progress Notes (Signed)
 Complex Care Management Note  Care Guide Note 10/02/2024 Name: Meagan Flores MRN: [id number] DOB: 1966-08-23  Meagan Flores is a 58 y.o. year old female who sees Delbert Clam, MD for primary care. I reached out to Meagan Flores by phone today to offer complex care management services.  Meagan Flores was given information about Complex Care Management services today including:   The Complex Care Management services include support from the care team which includes your Nurse Care Manager, Clinical Social Worker, or Pharmacist.  The Complex Care Management team is here to help remove barriers to the health concerns and goals most important to you. Complex Care Management services are voluntary, and the patient may decline or stop services at any time by request to their care team member.   Complex Care Management Consent Status: Patient agreed to services and verbal consent obtained.   Follow up plan:  Telephone appointment with complex care management team member scheduled for:  11/01/24 @ 10 AM   Encounter Outcome:  Patient Scheduled   Leotis Rase [facility], [facility] Guide  Direct Dial: [phone]  Fax [phone]

## 2024-10-03 DIAGNOSIS — E7841 Elevated Lipoprotein(a): Secondary | ICD-10-CM | POA: Insufficient documentation

## 2024-10-03 LAB — SPECIMEN STATUS REPORT

## 2024-10-03 LAB — LIPOPROTEIN A (LPA): Lipoprotein (a): 462.6 nmol/L — ABNORMAL HIGH (ref <75.0)

## 2024-10-12 ENCOUNTER — Other Ambulatory Visit: Payer: Self-pay | Admitting: Family Medicine

## 2024-10-15 ENCOUNTER — Ambulatory Visit (INDEPENDENT_AMBULATORY_CARE_PROVIDER_SITE_OTHER): Admitting: Podiatry

## 2024-10-15 ENCOUNTER — Encounter: Payer: Self-pay | Admitting: Podiatry

## 2024-10-15 DIAGNOSIS — M79672 Pain in left foot: Secondary | ICD-10-CM

## 2024-10-15 DIAGNOSIS — B070 Plantar wart: Secondary | ICD-10-CM | POA: Diagnosis not present

## 2024-10-15 DIAGNOSIS — M79671 Pain in right foot: Secondary | ICD-10-CM

## 2024-10-15 NOTE — Progress Notes (Signed)
 Patient presents for complaint of painful lesions on the plantar aspect of the foot on the left heel and also in the forefoot on the right.  They have been bothering for several months now.   Physical exam:  General appearance: Pleasant, and in no acute distress. AOx3.  Vascular: Pedal pulses: DP 2/4 bilaterally, PT 2/4 bilaterally.  Mild edema lower legs bilaterally. Capillary fill time immediate bilaterally.  Neurological: Light touch intact feet bilaterally.  Normal Achilles reflex bilaterally.  No clonus or spasticity noted.   Dermatologic:   Verrucous lesion plantar heel left and plantar forefoot right with skin lines partially going around the lesion and some pinpoint hemorrhages.  Skin normal temperature bilaterally.  Skin normal color, tone, and texture bilaterally.   Musculoskeletal: Hammertoe deformities 2 through 5 bilaterally normal muscle strength lower extremity bilaterally   Diagnosis: 1.  Pain in feet bilaterally 2.  Plantar verruca left heel and forefoot right  Plan: -New patient office visit for evaluation and management.  Level 3.  Modifier 25. - Discussed with the lesions.  They appear to be verrucous in origin.  Discussed with the differential diagnoses.  Will try some Salinocaine on these today.  Wear good comfortable supportive well cushioned shoes. -Applied Salinocaine compound to lesion(s) as noted in physical exam after debriding lesions to pinpoint bleeding.  Salinocaine applied to lesion(s) and covered with an occlusive dressing with Coban wrap.  Written and oral instructions given to patient.   Return 2 weeks follow-up verrucous lesions feet bilaterally

## 2024-10-17 ENCOUNTER — Other Ambulatory Visit: Payer: Self-pay | Admitting: Family Medicine

## 2024-10-17 NOTE — Telephone Encounter (Signed)
 Copied from CRM #[id number]. Topic: Clinical - Medication Refill >> Oct 17, 2024 10:11 AM Tiffini S wrote: Medication:  clotrimazole  (LOTRIMIN ) 1 % cream Has the patient contacted their pharmacy? Yes (Agent: If no, request that the patient contact the pharmacy for the refill. If patient does not wish to contact the pharmacy document the reason why and proceed with request.) (Agent: If yes, when and what did the pharmacy advise?)  This is the patient's preferred pharmacy:   [facility] - [place], [place] - [address] [place] [address] Phone: [phone] Fax: [phone]  Is this the correct pharmacy for this prescription? Yes If no, delete pharmacy and type the correct one.   Has the prescription been filled recently? Yes  Is the patient out of the medication? Yes, low on the medication   Has the patient been seen for an appointment in the last year OR does the patient have an upcoming appointment? Yes  Can we respond through MyChart? No, please call the patient at [phone]  Agent: Please be advised that Rx refills may take up to 3 business days. We ask that you follow-up with your pharmacy.

## 2024-10-19 MED ORDER — CLOTRIMAZOLE 1 % EX CREA: 1.0000 | TOPICAL_CREAM | Freq: Two times a day (BID) | CUTANEOUS | 0 refills | Status: AC

## 2024-10-19 NOTE — Telephone Encounter (Signed)
 Requested medications are due for refill today.  yes  Requested medications are on the active medications list.  yes  Last refill. 06/12/2024 30g 0 rf  Future visit scheduled.   yes  Notes to clinic.  Refill not delegated    Requested Prescriptions  Pending Prescriptions Disp Refills   clotrimazole  (LOTRIMIN ) 1 % cream 30 g 0    Sig: Apply 1 Application topically 2 (two) times daily.     Off-Protocol Failed - 10/19/2024  1:50 PM      Failed - Medication not assigned to a protocol, review manually.      Passed - Valid encounter within last 12 months    Recent Outpatient Visits           3 weeks ago Postoperative hypothyroidism   [facility] [place]. [facility] Delbert Clam, MD   4 months ago Prediabetes   [facility] [place]. [facility] Delbert Clam, MD   7 months ago Bereavement   [facility] [place]. [facility] Delbert Clam, MD   9 months ago Postoperative hypothyroidism   [facility] [place] Delbert Clam, MD   1 year ago Postoperative hypothyroidism   [facility] [place]. [facility] Delbert Clam, MD

## 2024-10-23 ENCOUNTER — Encounter (HOSPITAL_BASED_OUTPATIENT_CLINIC_OR_DEPARTMENT_OTHER): Payer: Self-pay | Admitting: Internal Medicine

## 2024-10-23 ENCOUNTER — Ambulatory Visit (INDEPENDENT_AMBULATORY_CARE_PROVIDER_SITE_OTHER): Admitting: Internal Medicine

## 2024-10-23 VITALS — BP 124/80 | HR 66 | Ht 68.0 in | Wt 201.4 lb

## 2024-10-23 DIAGNOSIS — I700 Atherosclerosis of aorta: Secondary | ICD-10-CM

## 2024-10-23 DIAGNOSIS — E7841 Elevated Lipoprotein(a): Secondary | ICD-10-CM | POA: Diagnosis not present

## 2024-10-23 DIAGNOSIS — Z8673 Personal history of transient ischemic attack (TIA), and cerebral infarction without residual deficits: Secondary | ICD-10-CM | POA: Diagnosis not present

## 2024-10-23 DIAGNOSIS — E785 Hyperlipidemia, unspecified: Secondary | ICD-10-CM

## 2024-10-23 DIAGNOSIS — T466X5D Adverse effect of antihyperlipidemic and antiarteriosclerotic drugs, subsequent encounter: Secondary | ICD-10-CM

## 2024-10-23 DIAGNOSIS — T466X5A Adverse effect of antihyperlipidemic and antiarteriosclerotic drugs, initial encounter: Secondary | ICD-10-CM

## 2024-10-23 DIAGNOSIS — M791 Myalgia, unspecified site: Secondary | ICD-10-CM

## 2024-10-23 MED ORDER — REPATHA SURECLICK 140 MG/ML ~~LOC~~ SOAJ: 140.0000 mg | SUBCUTANEOUS | 3 refills | Status: AC

## 2024-10-23 NOTE — Progress Notes (Signed)
 LIPID CLINIC CONSULT NOTE  Chief Complaint:  Manage dyslipidemia  Primary Care Physician: Delbert Clam, MD  Primary Cardiologist:  Lamar Fitch, MD  HPI:  Meagan Flores is a 58 y.o. female who is being seen today for the evaluation of dyslipidemia at the request of Delbert Clam, MD. this is a pleasant 58 year old female kindly referred for evaluation and management of dyslipidemia.  She has a history of high cholesterol and reported today that she has been not taking her statin or ezetimibe  due to side effects including fatigue and muscle aches.  Her recent lipid showed total cholesterol 227, triglycerides 89, HDL 49 and LDL 162.  Her goal LDL is less than 70.  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  This is significantly elevated at 4 and 62.6 nmol/L.  There is strong family history of heart disease including multiple family members and previous imaging is showing that she has some aortic atherosclerosis. She also reports a history of TIA.  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   Prediabetes    Preop cardiovascular exam 11/25/2020   Pulmonary hypertension, unspecified (HCC) 11/25/2020   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   Rotator cuff impingement syndrome of right shoulder 09/30/2020   Skin rash 02/17/2021   Stroke (HCC)    Tendinopathy of right biceps tendon 09/30/2020   Thyroid  disease    TIA (transient ischemic attack) 04/15/2018    Tobacco use 09/28/2016    Past Surgical History:  Procedure Laterality Date   BICEPT TENODESIS Right 12/16/2021   Procedure: RIGHT BICEPS TENODESIS;  Surgeon: Jerri Kay HERO, MD;  Location: [facility];  Service: Orthopedics;  Laterality: Right;   PARTIAL HYSTERECTOMY     right arm sx     SHOULDER ARTHROSCOPY WITH BICEPSTENOTOMY Right 01/21/2021   Procedure: SHOULDER ARTHROSCOPY WITH BICEPSTENOTOMY;  Surgeon: Jerri Kay HERO, MD;  Location: [facility];  Service: Orthopedics;  Laterality: Right;   SHOULDER ARTHROSCOPY WITH DISTAL CLAVICLE RESECTION Right 01/12/2017   Procedure: RIGHT SHOULDER ARTHROSCOPY WITH  SUBACROMIAL DECOMPRESSION, DISTAL CLAVICLE EXCISION, extensive debridement;  Surgeon: Kay HERO Jerri, MD;  Location:  SURGERY CENTER;  Service: Orthopedics;  Laterality: Right;   THYMECTOMY  2004   THYROIDECTOMY      FAMHx:  Family History  Problem Relation Age of Onset   Stomach cancer Sister    Colon polyps Sister    Esophageal cancer Neg Hx    Rectal cancer Neg Hx    Colon cancer Neg Hx    Breast cancer Neg Hx     SOCHx:   reports that she has been smoking cigarettes. She has a 17 pack-year smoking history. She has never used smokeless tobacco. She reports current alcohol use. She reports that she does not use drugs.  ALLERGIES:  Allergies  Allergen Reactions   Sulfa Antibiotics Hives    ROS: Pertinent items noted in HPI and remainder of comprehensive ROS otherwise negative.  HOME MEDS: Current Outpatient Medications  on File Prior to Visit  Medication Sig Dispense Refill   acetaminophen -codeine  (TYLENOL  #3) 300-30 MG tablet Take 1 tablet by mouth every 4 (four) hours as needed for moderate pain or severe pain. 60 tablet 1   aspirin  EC 81 MG tablet Take 1 tablet (81 mg total) by mouth daily. 60 tablet 3   buPROPion  (WELLBUTRIN  XL) 150 MG 24 hr tablet Take 1 tablet (150 mg total) by mouth daily. For smoking cessation 90 tablet 1    clotrimazole  (LOTRIMIN ) 1 % cream Apply 1 Application topically 2 (two) times daily. 30 g 0   escitalopram  (LEXAPRO ) 10 MG tablet Take 1 tablet (10 mg total) by mouth daily. 90 tablet 1   ezetimibe  (ZETIA ) 10 MG tablet Take 1 tablet (10 mg total) by mouth daily. 90 tablet 3   levothyroxine  (EUTHYROX ) 125 MCG tablet Take 1 tablet (125 mcg total) by mouth daily before breakfast. 90 tablet 1   lidocaine  (LIDODERM ) 5 % Place 1 patch onto the skin daily. Remove & Discard patch within 12 hours or as directed by MD 90 patch 1   metoprolol  succinate (TOPROL  XL) 25 MG 24 hr tablet Take 1 tablet (25 mg total) by mouth daily. 90 tablet 1   mupirocin  ointment (BACTROBAN ) 2 % Apply topically.     naproxen  (NAPROSYN ) 500 MG tablet Take 1 tablet (500 mg total) by mouth 2 (two) times daily with a meal. 60 tablet 1   nicotine  polacrilex (NICORETTE  MINI) 4 MG lozenge Use two times a day to reduce tobacco use 100 tablet 4   rosuvastatin  (CRESTOR ) 40 MG tablet Take 1 tablet (40 mg total) by mouth daily. 90 tablet 1   tiZANidine  (ZANAFLEX ) 4 MG tablet Take 1 tablet (4 mg total) by mouth every 8 (eight) hours as needed. 180 tablet 1   traZODone  (DESYREL ) 100 MG tablet Take 1 tablet (100 mg total) by mouth at bedtime as needed for sleep. 90 tablet 1   triamcinolone  cream (KENALOG ) 0.1 % Apply 1 Application topically 2 (two) times daily. 45 g 2   estradiol  (ESTRACE  VAGINAL) 0.1 MG/GM vaginal cream Apply a pea size amount to vulva twice a week (Patient not taking: Reported on 10/23/2024) 42.5 g 1   No current facility-administered medications on file prior to visit.    LABS/IMAGING: No results found for this or any previous visit (from the past 48 hours). No results found.  LIPID PANEL:    Component Value Date/Time   CHOL 227 (H) 09/28/2024 1005   TRIG 89 09/28/2024 1005   HDL 49 09/28/2024 1005   CHOLHDL 4.5 (H) 02/03/2023 0957   CHOLHDL 4.5 04/15/2018 0058   VLDL 28 04/15/2018 0058   LDLCALC 162 (H)  09/28/2024 1005    Lipoprotein (a)  Date/Time Value Ref Range Status  09/28/2024 10:05 AM 462.6 (H) <75.0 nmol/L Final    Comment:    Note:  Values greater than or equal to 75.0 nmol/L may        indicate an independent risk factor for CHD,        but must be evaluated with caution when applied        to non-Caucasian populations due to the        influence of genetic factors on Lp(a) across        ethnicities.      WEIGHTS: Wt Readings from Last 3 Encounters:  10/23/24 201 lb 6.4 oz (91.4 kg)  09/28/24 198 lb 12.8 oz (90.2 kg)  06/12/24  198 lb (89.8 kg)    VITALS: BP 124/80   Pulse 66   Ht 5' 8 (1.727 m)   Wt 201 lb 6.4 oz (91.4 kg)   SpO2 92%   BMI 30.62 kg/m   EXAM: Deferred  EKG: Deferred  ASSESSMENT: Dyslipidemia, goal LDL less than 70 Elevated OE(j)-537.3 nmol/L Statin and ezetimibe  intolerant-fatigue, myalgias History of TIA Aortic atherosclerosis  PLAN: 1.   Ms. Boutelle has a dyslipidemia and remains about 50% above the goal LDL less than 70.  She has not been on statins or ezetimibe  due to intolerances.  She also has a very high LP(a).  Fortunately she is on daily aspirin  which she should continue for this due to its procoagulant properties.  She would also benefit from additional lipid-lowering and a PCSK9 inhibitor.  I would advise Repatha  140 mg every 2 weeks.  Hopefully this will help reduce LDL and potentially LP(a).  She is agreeable to proceed with this.  Will plan repeat lipids in about 3 months on therapy.  Thanks again for the kind referral.  Vinie KYM Maxcy, MD, [facility], [facility], FNLA, FACP  [facility]  Medical Director of the Advanced Lipid Disorders &  Cardiovascular Risk Reduction Clinic Diplomate of the American Board of Clinical Lipidology Attending Cardiologist  Direct Dial: [phone]  Fax: [phone]  Website:  [email or web address] Vinie JAYSON Maxcy 10/23/2024, 2:41 PM

## 2024-10-23 NOTE — Patient Instructions (Signed)
 Medication Instructions:  Dr. Mona recommends Repatha  (PCSK9). This is an injectable cholesterol medication self-administered once every 14 days. This medication will likely need prior approval with your insurance company, which we will work on. If the medication is not approved initially, we may need to do an appeal with your insurance. If approved, we will provide you with copay and cost information. We'll then send the prescription to your pharmacy. We would have you complete another set of fasting labs between 3-4 months to reassess cholesterol.   Repatha  is self-injected once every 14 days in subcutaneous or fatty tissue - such as belly or side/outer/upper thigh. It is best stored in the refrigerator but is stable at room temp up to 28 days. Please take the pen-injector out of fridge about 30 minutes - 1 hour prior to injection, to allow it to warm closer to room temperature.   This medication is very effective in lowering LDL and can lower LPa, as Dr. Mona mentioned. It is also generally well tolerated -- most common reaction may be cold-like symptoms such as runny nose, scratchy throat, as this is an antibody therapy. It is generally self-limiting and after a few doses, your body should have normalized to the medication.   Here is a demo video: https://www.repatha .com/how-to-start-repatha -injection   If you need a co-pay card for Repatha : https://www.repatha .com/repatha -cost If you need a co-pay card for Praluent: remodelingdvds.fi  Patient Assistance:    These foundations have funds at various times.   The [employer] Foundation: https://www.panfoundation.org/disease-funds/hypercholesterolemia/ -- can sign up for wait list  The [facility] offers assistance to help pay for medication copays.  They will cover copays for all cholesterol lowering meds, including statins, fibrates, omega-3 fish oils like Vascepa, ezetimibe , Repatha , Praluent,  Nexletol, Nexlizet.  The cards are usually good for $2,500 or 12 months, whichever comes first. Our fax # is [phone] (you will need this to apply) Go to healthwellfoundation.org Click on "Apply Now" Answer questions as to whom is applying (patient or representative) Your disease fund will be "hypercholesterolemia - Medicare access" They will ask questions about finances and which medications you are taking for cholesterol When you submit, the approval is usually within minutes.  You will need to print the card information from the site You will need to show this information to your pharmacy, they will bill your Medicare Part D plan first -then bill [employer] Well --for the copay.   You can also call them at [phone], although the hold times can be quite long.     *If you need a refill on your cardiac medications before your next appointment, please call your pharmacy*  Lab Work: Your physician recommends that you return for lab work in:  3 months   NMR Lipoprofile and Lp(a)  If you have labs (blood work) drawn today and your tests are completely normal, you will receive your results only by: MyChart Message (if you have MyChart) OR A paper copy in the mail If you have any lab test that is abnormal or we need to change your treatment, we will call you to review the results.  Testing/Procedures: none  Follow-Up: As needed

## 2024-10-24 ENCOUNTER — Telehealth: Payer: Self-pay | Admitting: Pharmacy Technician

## 2024-10-24 ENCOUNTER — Other Ambulatory Visit (HOSPITAL_COMMUNITY): Payer: Self-pay

## 2024-10-24 NOTE — Telephone Encounter (Signed)
 Pharmacy Patient Advocate Encounter   Received notification from Physician's Office that prior authorization for repatha  is required/requested.   Insurance verification completed.   The patient is insured through [facility] MEDICAID.   Per test claim: PA required; PA submitted to above mentioned insurance via Latent Key/confirmation #/EOC [id number] Status is pending

## 2024-10-26 ENCOUNTER — Other Ambulatory Visit (HOSPITAL_COMMUNITY): Payer: Self-pay

## 2024-10-27 ENCOUNTER — Encounter: Payer: Self-pay | Admitting: Gastroenterology

## 2024-10-28 ENCOUNTER — Other Ambulatory Visit: Payer: Self-pay

## 2024-10-28 ENCOUNTER — Encounter (HOSPITAL_BASED_OUTPATIENT_CLINIC_OR_DEPARTMENT_OTHER): Payer: Self-pay | Admitting: Emergency Medicine

## 2024-10-28 ENCOUNTER — Emergency Department (HOSPITAL_BASED_OUTPATIENT_CLINIC_OR_DEPARTMENT_OTHER)
Admission: EM | Admit: 2024-10-28 | Discharge: 2024-10-28 | Disposition: A | Discharge status: Home / Self Care | Attending: Emergency Medicine | Admitting: Emergency Medicine

## 2024-10-28 ENCOUNTER — Emergency Department (HOSPITAL_BASED_OUTPATIENT_CLINIC_OR_DEPARTMENT_OTHER)

## 2024-10-28 DIAGNOSIS — Z7982 Long term (current) use of aspirin: Secondary | ICD-10-CM | POA: Insufficient documentation

## 2024-10-28 DIAGNOSIS — G8929 Other chronic pain: Secondary | ICD-10-CM | POA: Insufficient documentation

## 2024-10-28 DIAGNOSIS — M25511 Pain in right shoulder: Secondary | ICD-10-CM | POA: Diagnosis present

## 2024-10-28 DIAGNOSIS — M898X2 Other specified disorders of bone, upper arm: Secondary | ICD-10-CM

## 2024-10-28 DIAGNOSIS — W1839XA Other fall on same level, initial encounter: Secondary | ICD-10-CM | POA: Diagnosis not present

## 2024-10-28 MED ORDER — OXYCODONE-ACETAMINOPHEN 5-325 MG PO TABS
1.0000 | ORAL_TABLET | Freq: Once | ORAL | Status: DC
  Filled 2024-10-28: qty 1

## 2024-10-28 MED ORDER — DICLOFENAC SODIUM 75 MG PO TBEC: 75.0000 mg | DELAYED_RELEASE_TABLET | Freq: Two times a day (BID) | ORAL | 0 refills | Status: AC

## 2024-10-28 MED ORDER — LIDOCAINE 5 % EX PTCH
1.0000 | MEDICATED_PATCH | CUTANEOUS | Status: DC
  Administered 2024-10-28: 1 via TRANSDERMAL
  Filled 2024-10-28: qty 1

## 2024-10-28 MED ORDER — IBUPROFEN 400 MG PO TABS
600.0000 mg | ORAL_TABLET | Freq: Once | ORAL | Status: DC
  Filled 2024-10-28: qty 1

## 2024-10-28 NOTE — ED Provider Notes (Signed)
 [facility] Provider Note   CSN: [id number] Arrival date & time: 10/28/24  9164     Patient presents with: Fall and Arm Injury   Meagan Flores is a 58 y.o. female.   Patient is a 58 year old female presenting for right shoulder and right arm pain after fall.  Patient states she suffers chronically from right-sided shoulder pain.  Chart review demonstrates she has had right sided arthroscopy with extensive debridement, subacromial decompression, distal clavicle excision, and biceps tenotomy in 2018 and 2021 with Dr. Jerri.  In 2023 she again had right biceps tenodesis.  She states 2 weeks ago she fell on her right side and reinjured her shoulder and arm.  She admits to difficulty raising the arm out to the side, across her chest, and out in front of her.  She denies sensation deficits.  She denies swelling, redness, or bruising.  Currently she is taking lidocaine  pain patches and muscle relaxers with no improvement of pain.  The history is provided by the patient. No language interpreter was used.  Fall Pertinent negatives include no chest pain, no abdominal pain and no shortness of breath.  Arm Injury Associated symptoms: no back pain and no fever        Prior to Admission medications   Medication Sig Start Date End Date Taking? Authorizing Provider  diclofenac  (VOLTAREN ) 75 MG EC tablet Take 1 tablet (75 mg total) by mouth 2 (two) times daily. 10/28/24  Yes Elnor Hila P, DO  acetaminophen -codeine  (TYLENOL  #3) 300-30 MG tablet Take 1 tablet by mouth every 4 (four) hours as needed for moderate pain or severe pain. 04/06/23   Brien Belvie BRAVO, MD  aspirin  EC 81 MG tablet Take 1 tablet (81 mg total) by mouth daily. 01/27/22   Brien Belvie BRAVO, MD  buPROPion  (WELLBUTRIN  XL) 150 MG 24 hr tablet Take 1 tablet (150 mg total) by mouth daily. For smoking cessation 09/28/24   Newlin, Enobong, MD  clotrimazole  (LOTRIMIN ) 1 % cream Apply 1 Application  topically 2 (two) times daily. 10/19/24   Newlin, Enobong, MD  escitalopram  (LEXAPRO ) 10 MG tablet Take 1 tablet (10 mg total) by mouth daily. 09/28/24   Newlin, Enobong, MD  estradiol  (ESTRACE  VAGINAL) 0.1 MG/GM vaginal cream Apply a pea size amount to vulva twice a week Patient not taking: Reported on 10/23/2024 10/11/23   Newlin, Enobong, MD  Evolocumab  (REPATHA  SURECLICK) 140 MG/ML SOAJ Inject 140 mg into the skin every 14 (fourteen) days. 10/23/24   Hilty, Vinie BROCKS, MD  ezetimibe  (ZETIA ) 10 MG tablet Take 1 tablet (10 mg total) by mouth daily. 02/07/23   Brien Belvie BRAVO, MD  levothyroxine  (EUTHYROX ) 125 MCG tablet Take 1 tablet (125 mcg total) by mouth daily before breakfast. 10/02/24   Newlin, Enobong, MD  lidocaine  (LIDODERM ) 5 % Place 1 patch onto the skin daily. Remove & Discard patch within 12 hours or as directed by MD 06/12/24   Newlin, Enobong, MD  metoprolol  succinate (TOPROL  XL) 25 MG 24 hr tablet Take 1 tablet (25 mg total) by mouth daily. 06/12/24   Newlin, Enobong, MD  mupirocin  ointment (BACTROBAN ) 2 % Apply topically. 01/08/19   [provider]  naproxen  (NAPROSYN ) 500 MG tablet Take 1 tablet (500 mg total) by mouth 2 (two) times daily with a meal. 10/11/23   Delbert Clam, MD  nicotine  polacrilex (NICORETTE  MINI) 4 MG lozenge Use two times a day to reduce tobacco use 04/06/23   Brien Belvie  E, MD  rosuvastatin  (CRESTOR ) 40 MG tablet Take 1 tablet (40 mg total) by mouth daily. 09/28/24   Newlin, Enobong, MD  tiZANidine  (ZANAFLEX ) 4 MG tablet Take 1 tablet (4 mg total) by mouth every 8 (eight) hours as needed. 09/28/24   Newlin, Enobong, MD  traZODone  (DESYREL ) 100 MG tablet Take 1 tablet (100 mg total) by mouth at bedtime as needed for sleep. 09/28/24   Newlin, Enobong, MD  triamcinolone  cream (KENALOG ) 0.1 % Apply 1 Application topically 2 (two) times daily. 01/11/24   Newlin, Enobong, MD    Allergies: Sulfa antibiotics    Review of Systems  Constitutional:   Negative for chills and fever.  HENT:  Negative for ear pain and sore throat.   Eyes:  Negative for pain and visual disturbance.  Respiratory:  Negative for cough and shortness of breath.   Cardiovascular:  Negative for chest pain and palpitations.  Gastrointestinal:  Negative for abdominal pain and vomiting.  Genitourinary:  Negative for dysuria and hematuria.  Musculoskeletal:  Negative for arthralgias and back pain.  Skin:  Negative for color change and rash.  Neurological:  Negative for seizures and syncope.  All other systems reviewed and are negative.   Updated Vital Signs BP 139/84   Pulse 71   Temp 98.4 F (36.9 C) (Oral)   Resp 20   SpO2 100%   Physical Exam Vitals and nursing note reviewed.  Constitutional:      General: She is not in acute distress.    Appearance: She is well-developed.  HENT:     Head: Normocephalic and atraumatic.  Eyes:     Conjunctiva/sclera: Conjunctivae normal.  Cardiovascular:     Rate and Rhythm: Normal rate and regular rhythm.     Heart sounds: No murmur heard. Pulmonary:     Effort: Pulmonary effort is normal. No respiratory distress.     Breath sounds: Normal breath sounds.  Abdominal:     Palpations: Abdomen is soft.     Tenderness: There is no abdominal tenderness.  Musculoskeletal:        General: No swelling.     Right shoulder: Tenderness and bony tenderness present. No swelling, deformity, effusion or laceration.     Left shoulder: Normal.     Right upper arm: Tenderness and bony tenderness present. No swelling, edema, deformity or lacerations.     Left upper arm: Normal.     Right elbow: Normal.     Left elbow: Normal.     Right forearm: Normal.     Left forearm: Normal.     Right wrist: Normal.     Left wrist: Normal.     Right hand: Normal.     Left hand: Normal.       Arms:     Cervical back: Neck supple. No bony tenderness.     Thoracic back: No bony tenderness.     Lumbar back: No bony tenderness.  Skin:     General: Skin is warm and dry.     Capillary Refill: Capillary refill takes less than 2 seconds.  Neurological:     Mental Status: She is alert.     GCS: GCS eye subscore is 4. GCS verbal subscore is 5. GCS motor subscore is 6.     Sensory: Sensation is intact.     Motor: Motor function is intact.  Psychiatric:        Mood and Affect: Mood normal.     (all labs ordered are listed, but only  abnormal results are displayed) Labs Reviewed - No data to display  EKG: None  Radiology: DG Humerus Right Result Date: 10/28/2024 CLINICAL DATA:  Fall onto arm.  Right upper arm pain. EXAM: RIGHT HUMERUS - 2+ VIEW COMPARISON:  None Available. FINDINGS: There is no evidence of fracture or other focal bone lesions. Small surgical screws noted in the medial epicondyle of the distal humerus. Soft tissues are unremarkable. IMPRESSION: No acute findings. Electronically Signed   By: Norleen DELENA Kil M.D.   On: 10/28/2024 10:06   DG Shoulder Right Result Date: 10/28/2024 CLINICAL DATA:  Fall.  Right shoulder pain. EXAM: RIGHT SHOULDER - 2+ VIEW COMPARISON:  None Available. FINDINGS: There is no evidence of fracture or dislocation. There is no evidence of arthropathy or other focal bone abnormality. Soft tissues are unremarkable. IMPRESSION: Negative. Electronically Signed   By: Norleen DELENA Kil M.D.   On: 10/28/2024 10:04     Procedures   Medications Ordered in the ED  ibuprofen  (ADVIL ) tablet 600 mg (600 mg Oral Patient Refused/Not Given 10/28/24 0914)  lidocaine  (LIDODERM ) 5 % 1 patch (1 patch Transdermal Patch Applied 10/28/24 0914)  oxyCODONE -acetaminophen  (PERCOCET/ROXICET) 5-325 MG per tablet 1 tablet (1 tablet Oral Patient Refused/Not Given 10/28/24 9085)                                    Medical Decision Making Amount and/or Complexity of Data Reviewed Radiology: ordered.  Risk Prescription drug management.   58 year old female presenting for right shoulder and right arm pain after fall.   Patient states she suffers chronically from right-sided shoulder pain.    Chart review demonstrates she has had right sided arthroscopy with extensive debridement, subacromial decompression, distal clavicle excision, and biceps tenotomy in 2018 and 2021 with Dr. Jerri.  In 2023 she again had right biceps tenodesis.   Motrin , lidocaine  patch, Norco given for pain.  On exam patient is alert and oriented x 3, no acute distress, afebrile, stable vitals and pain.  She has pain at the [facility] joint as well as the mid humerus shaft.  No external signs of erythema, swelling, warmth, ecchymosis, hematomas.  Arm is neurovascularly intact.  Soft compartments.  No gross deformities palpated.  X-rays demonstrate no acute process.  Patient recommended for rest, medication for pain control, and follow-up with orthopedics symptoms continue.  Patient in no distress and overall condition improved here in the ED. Detailed discussions were had with the patient regarding current findings, and need for close f/u with PCP or on call doctor. The patient has been instructed to return immediately if the symptoms worsen in any way for re-evaluation. Patient verbalized understanding and is in agreement with current care plan. All questions answered prior to discharge.      Final diagnoses:  Acute on chronic pain of right shoulder  Pain of right humerus    ED Discharge Orders          Ordered    diclofenac  (VOLTAREN ) 75 MG EC tablet  2 times daily        10/28/24 1017               Elnor Bernarda SQUIBB, DO 10/28/24 1018

## 2024-10-28 NOTE — ED Triage Notes (Signed)
 Pt to ER states she slipped and fell approximately 2 weeks ago onto right arm/shoulder.  Pt states previous surgery to same.  States she has been using ice and ibuprofen  and continues to have pain to same.

## 2024-10-28 NOTE — Discharge Instructions (Signed)
 Diclofenac  prescription sent to pharmacy for muscle and bone pain.  Please do not take this prescription with your naproxen  prescription that you have at the house.  They are both in the same medication class called NSAIDs.

## 2024-10-29 ENCOUNTER — Other Ambulatory Visit (HOSPITAL_COMMUNITY): Payer: Self-pay

## 2024-10-29 NOTE — Telephone Encounter (Signed)
 Pharmacy Patient Advocate Encounter  Received notification from [place] MEDICAID that Prior Authorization for repatha  has been APPROVED from 10/26/24 to 10/26/25. Unable to obtain price due to refill too soon rejection, last fill date 10/26/24 next available fill date02/2026   PA #/Case ID/Reference #: waiting on fax

## 2024-11-01 ENCOUNTER — Telehealth: Payer: Self-pay

## 2024-11-01 ENCOUNTER — Other Ambulatory Visit: Payer: Self-pay | Admitting: Licensed Clinical Social Worker

## 2024-11-01 NOTE — Patient Outreach (Signed)
 LCSW introduced herself and explained role in Complex Care Management. Pt confirmed that she has been active with Glendia Pear, LCSW and agreed to being r/s with him for LCSW f/up.   LCSW collaborated with Care Guide, Leotis Rase, about contacting pt to r/s f/up call. No additional concerns noted.

## 2024-11-01 NOTE — Progress Notes (Signed)
 Complex Care Management Note  Care Guide Note 11/01/2024 Name: Meagan Flores MRN: [id number] DOB: [birth year]  Meagan Flores is a 58 y.o. year old female who sees Newlin, Enobong, MD for primary care. I reached out to Meagan Flores by phone today to offer complex care management services.  Meagan Flores was given information about Complex Care Management services today including:   The Complex Care Management services include support from the care team which includes your Nurse Care Manager, Clinical Social Worker, or Pharmacist.  The Complex Care Management team is here to help remove barriers to the health concerns and goals most important to you. Complex Care Management services are voluntary, and the patient may decline or stop services at any time by request to their care team member.   Complex Care Management Consent Status: Patient agreed to services and verbal consent obtained.   Follow up plan:  Telephone appointment with complex care management team member scheduled for:  11/13/24 @ 9 AM  Encounter Outcome:  Patient Scheduled  Leotis Rase [facility] Guide  Direct Dial: [phone]  Fax [phone]

## 2024-11-02 ENCOUNTER — Ambulatory Visit: Admitting: Podiatry

## 2024-11-06 ENCOUNTER — Ambulatory Visit: Admitting: Podiatry

## 2024-11-12 ENCOUNTER — Ambulatory Visit: Admitting: Podiatry

## 2024-11-12 DIAGNOSIS — B070 Plantar wart: Secondary | ICD-10-CM

## 2024-11-12 NOTE — Progress Notes (Signed)
 Today for plantar verruca feet bilaterally.  Less pain than before.   Physical exam:  General appearance: Pleasant, and in no acute distress. AOx3.  Vascular: Pedal pulses: DP 2/4 bilaterally, PT 2/4 bilaterally. Mild edema lower legs bilaterally. Capillary fill time immediate b/l.SABRA  Neurological: Grossly intact bilaterally  Dermatologic:   Verrucous lesions with skin lines going around the lesion and some pinpoint hemorrhages plantar heel left and plantar forefoot right.  Reduced from previous visit.  Skin normal temperature bilaterally.  Skin normal color, tone, and texture bilaterally.   Musculoskeletal:    Diagnosis: 1.  Plantar verruca left heel and forefoot right  Plan: -Verrucous lesions left heel and forefoot right have improved.  -Applied Salinocaine compound to lesion(s) as noted in physical exam after debriding lesions to pinpoint bleeding.  Salinocaine applied to lesion(s) and covered with an occlusive dressing with Coban wrap.  Written and oral instructions given to patient.      Return 3 weeks follow-up verrucae feet

## 2024-11-13 ENCOUNTER — Other Ambulatory Visit: Payer: Self-pay | Admitting: Licensed Clinical Social Worker

## 2024-11-13 NOTE — Patient Instructions (Signed)
 Visit Information  Thank you for taking time to visit with me today. Please don't hesitate to contact me if I can be of assistance to you before our next scheduled appointment.  Our next appointment is by telephone on 12/18/2024 at 9:30 AM   Please call the care guide team at [phone] if you need to cancel or reschedule your appointment.   Following is a copy of your care plan:   Goals Addressed             This Visit's Progress    VBCI Social Work Care Plan       Problems:   Grief Issues faced since death of her son in February 21, 2023             Right shoulder pain issues and movement challenges             Financial challenges             Counseling needs             Sleeping difficulty  CSW Clinical Goal(s):   Over the next 30  days the Patient will attend all scheduled medical appointments as evidenced by patient report and care team review of appointment completion in electronic medical record.               Client to call [facility] in [place], [place] or [facility] in [place], [place] in next 30 days  to talk with agency about counseling support services through that agency AEB patient report of contacting  mental health support agency to inquire about counseling support for client               Interventions:  Spoke with client about her current medical needs and emotional needs             Discussed grief issues of client. Her son died in 02-21-23.  Informed client of possible support with [facility] in [place], [place] and with [facility] in [place], [place].  Client has contact phone numbers for each counseling center and has been informed of addresses of each counseling center.              Discussed pain issues of client. Client spoke of pain in her right shoulder.client had recent fall and is recovering from this fall.                Talked with client about sleeping issues. She has  decreased sleep.                 Discussed client support with PCP                Discussed medication procurement. Discussed insurance coverage of client               Discussed client support from her spouse.  Discussed transport issues. Client said she has no transport problems at present.             Discussed ambulation. She said she gets fatigued occasionally in walking             Provided counseling support for client. Discussed grief symptoms experienced by client             Discussed program support with RN, LCSW, Pharmacist             Discussed movement challenges of client related to use of  her right sghoulder. She sometimes has to have some help in getting dressed             Reviewed mood issues. She has sad mood occasionally . LCSW talked with client about possible benefits of her receiving counseling support. She had previously  received some counseling support through [facility]             Encouraged client to call LCSW as needed for SW support at [phone]             Client was appreciative of call from LCSW today  Patient Goals/Self-Care Activities:  Connect with provider for ongoing mental health treatment.   Continue taking your medication as prescribed.   Attend scheduled appointments with PCP Practice self care and allow time for ADLs completion Call LCSW as needed for SW support Use coping skills to help reduced stress and manage symptoms faced Call [facility] in [place], [place] to inquire with agency about possible counseling support for client   Plan:   Telephone follow up appointment with care management team member scheduled for:  12/18/2024 at 9:30 AM         Please go to [facility] [address] [place] [phone]) if you are experiencing a Mental Health or Behavioral Health Crisis or need someone to talk to.  Patient verbalized understanding of Care plan and visit instructions communicated  this visit    Meagan Flores  MSW, LCSW [place]/[facility] Based Care [facility] Licensed Clinical Social Worker Direct Dial:  [phone] Fax:  [phone] Website:  [email or web address]

## 2024-11-13 NOTE — Patient Outreach (Signed)
 Complex Care Management   Visit Note  11/13/2024  Name:  Meagan Flores MRN: [id number] DOB: [birth year]  Situation: Referral received for Complex Care Management related to grief issues faced I obtained verbal consent from Patient.  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   COVID-19 virus infection 02/14/2020   Degenerative superior labral anterior-to-posterior (SLAP) tear of right shoulder 09/30/2020   Essential hypertension    Glenohumeral arthritis 12/27/2014   History of stroke    History of TIA (transient ischemic attack) 02/14/2020   Hyperlipidemia    Hypothyroidism    Impingement syndrome of right shoulder 12/24/2016   Mixed hyperlipidemia    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  Some difficulty occasionally with concentration    Neurological    Headaches; weakness.  Needs adequate rest and to use coping strategies  HEENT    Sudden change or loss of vision. To use coping skills to help    Cardiovascular    Fatigue. Needs to get adequate rest and not overexert. Needs to take rest breaks as needed. To use coping skills to manage completion of daily activities  Respiratory  Shortness of breath occasionally. Coping skills used. Needs to take rest breaks as needed.    Endocrine  Blurry vision. Shakiness,weakness or fatigue.  To use coping skills as needed, Get rest as able. Not overexert in activities    Gastrointestinal    Change in appetite. Feels that she has a reduced appetite    Genitourinary    Frequency.  Feels that she is going to bathroom more often  Integumentary  Itching of skin. Skin changes  Use healthy diet. Get adequate rest. Talk to PCP about skin needs  Musculoskeletal    Pain issues in arm and shoulder. She had a recent fall. She is taking a medication for shoulder pain.  Has some difficulty lifting her affected arm      Psychosocial  Sadness; Depression Anxiety.  Feels sad related to death of her son in 03/06/2023.  Has joined online chat group of mothers who have lost children.  She said this online support group has been helpful. She has been participating in on line support group for one month. Encouraged client to consider calling local mental health agencies of choice to discuss counseling support possibly through local mental health agency.  She has support from her nephew and from her sister and talks frequently with nephew and sisters.           11/13/2024    PHQ2-9 Depression Screening   Little interest or pleasure in doing things  1  Feeling down, depressed, or hopeless  2  PHQ-2 - Total Score  3  Trouble falling or staying asleep, or sleeping too much  2  Feeling tired or having little energy  2  Poor appetite  or overeating   1  Feeling bad about yourself - or that you are a failure or have let yourself or your family down  1  Trouble concentrating on things, such as reading the newspaper or watching television  1  Moving or speaking so slowly that other people could have noticed.  Or the opposite - being so fidgety or restless that you have been moving around a lot more than usual  1  Thoughts that you would be better off dead, or hurting yourself in some way  0  PHQ2-9 Total Score  11  If you checked off any problems, how difficult have these problems made it for you to  do your work, take care of things at home, or get along with other people  Somewhat difficulty   Depression Interventions/Treatment  Counseling; stress reduction activities     There were no vitals filed for this visit.  Client did not mention any BP issues during call today with LCSW  Medications Reviewed Today   Medications were not reviewed in this encounter     Recommendation:   PCP Follow-up Continue Current Plan of Care Call LCSW as needed for SW support Use stress reduction activities to help manage grief and stress issues Consider calling mental health agency of choice to inquire about counseling support services  Follow Up Plan:    LCSW to call client on 12/18/2024 at 9:30 AM   Glendia Pear  MSW, LCSW [place]/Value Based Care [facility] Licensed Clinical Social Worker Direct Dial:  [phone] Fax:  [phone] Website:  [email or web address]

## 2024-11-26 ENCOUNTER — Ambulatory Visit: Payer: Self-pay

## 2024-11-26 NOTE — Telephone Encounter (Signed)
 Patient was given earliest appointment.

## 2024-11-26 NOTE — Telephone Encounter (Signed)
 Copied from CRM #[id number]. Topic: Clinical - Red Word Triage >> Nov 26, 2024 11:15 AM Larissa RAMAN wrote: [facility] that prompted transfer to Nurse Triage: RT arm/shoulder pain. Patient states she fell over 1 month ago and had xray completed. Answer Assessment - Initial Assessment Questions 1. ONSET: When did the pain start?     Last month, fall 2. LOCATION: Where is the pain located?     right 3. PAIN: How bad is the pain? (Scale 1-10; or mild, moderate, severe)     8-9 4. WORK OR EXERCISE: Has there been any recent work or exercise that involved this part of the body?     fell 5. CAUSE: What do you think is causing the shoulder pain?     fall 6. OTHER SYMPTOMS: Do you have any other symptoms? (e.g., neck pain, swelling, rash, fever, numbness, weakness)     Difficulty lifting arm 7. PREGNANCY: Is there any chance you are pregnant? When was your last menstrual period?     no  Protocols used: Shoulder Pain-A-AH

## 2024-11-30 ENCOUNTER — Ambulatory Visit: Payer: Self-pay | Admitting: Internal Medicine

## 2024-12-04 ENCOUNTER — Ambulatory Visit: Admitting: Podiatry

## 2024-12-04 DIAGNOSIS — B070 Plantar wart: Secondary | ICD-10-CM | POA: Diagnosis not present

## 2024-12-04 NOTE — Progress Notes (Signed)
 Patient presents follow-up verrucous lesions bilaterally feet doing better had a little bit of pain immediately after application of Salinocaine last time however it has been feeling much better the past several days.   Physical exam:  General appearance: Pleasant, and in no acute distress. AOx3.  Vascular: Pedal pulses: DP 2/4 bilaterally, PT 2/4 bilaterally.  Mild edema lower legs bilaterally. Capillary fill time immediate bilaterally.  Neurological: Grossly intact bilaterally  Dermatologic:   Verrucous lesions plantar forefoot right and heel left improved.  Reduced in size.  Skin normal temperature bilaterally.  Skin normal color, tone, and texture bilaterally.   Musculoskeletal:      Diagnosis: 1.  Plantar verrucae feet bilaterally  Plan: -Verrucous lesions improved however will try an application of cantharidin to eliminate them   -Applied cantharidin compound to lesion(s) as noted in physical exam after debriding lesions to pinpoint bleeding.  Cantharidin applied to lesion(s) and covered with an occlusive dressing with Coban wrap.  Written and oral instructions given to patient.    Return 2 weeks follow-up lesions, possible biopsy if not resolved

## 2024-12-16 ENCOUNTER — Other Ambulatory Visit: Payer: Self-pay | Admitting: Family Medicine

## 2024-12-17 ENCOUNTER — Encounter: Payer: Self-pay | Admitting: Gastroenterology

## 2024-12-18 ENCOUNTER — Ambulatory Visit: Admitting: Podiatry

## 2024-12-18 ENCOUNTER — Other Ambulatory Visit: Payer: Self-pay | Admitting: Licensed Clinical Social Worker

## 2024-12-18 DIAGNOSIS — D489 Neoplasm of uncertain behavior, unspecified: Secondary | ICD-10-CM

## 2024-12-18 NOTE — Addendum Note (Signed)
 Addended by: Ruben Mahler L on: 12/18/2024 11:21 AM   Modules accepted: Orders

## 2024-12-18 NOTE — Progress Notes (Signed)
 Patient presents follow-up verrucous lesions feet bilaterally.  Had some discomfort with the cantharidin.  Lesions are still painful.   Physical exam:  General appearance: Pleasant, and in no acute distress. AOx3.  Vascular: Pedal pulses: DP 2/4 bilaterally, PT 2 to/4 bilaterally.  Mild edema lower legs bilaterally. Capillary fill time immediate bilaterally.  Neurological: Grossly intact bilaterally  Dermatologic:   3 mm in diameter verrucous neoplasm plantar forefoot right and plantar heel left.  Lesions are raised.  Circular.  Skin lines go around the lesions partially.  Some pinpoint hemorrhages.  Skin normal temperature bilaterally.  Skin normal color, tone, and texture bilaterally.   Musculoskeletal:     Diagnosis: 1.  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 Plan: -Discussed with the continued lesions.  Has not responded to the Salinocaine or cantharidin.  Will do a biopsy of the lesion on the heel left today.  Will do the right forefoot in 2 weeks.  Given the sensitive to the patient's feet I think be better doing 1 foot at a time  -Consent reviewed and signed for biopsy of skin lesion. -Surgical site(s) anesthetized with 3 cc of a one-to-one mixture of 0.5% Marcaine  plain and 2% lidocaine  with epi.  Surgical site prepped.  Punch biopsies of plantar heel lesion(s) left noted in physical exam were performed with an appropriately sized biopsy punch(s).  Lesions were sent for pathological evaluation.  Triple antibiotic ointment was applied to the surgery sites and a gauze and Coban dressing applied.  Written and oral postoperative instructions were given.  -Return in 2 weeks for POV and biopsy lesion right foot

## 2024-12-18 NOTE — Patient Instructions (Signed)
 Visit Information  Thank you for taking time to visit with me today. Please don't hesitate to contact me if I can be of assistance to you before our next scheduled appointment.  Our next appointment is by telephone on 01/28/2025 at 2:00 PM   Please call the care guide team at [phone] if you need to cancel or reschedule your appointment.   Following is a copy of your care plan:   Goals Addressed             This Visit's Progress    VBCI Social Work Care Plan   On track    Problems:   Grief Issues faced since death of her son in 02/14/2023             Right shoulder pain issues and movement challenges             Financial challenges             Counseling needs             Sleeping difficulty  CSW Clinical Goal(s):   Over the next 30  days the Patient will attend all scheduled medical appointments as evidenced by patient report and care team review of appointment completion in electronic medical record.               Client to call [facility] in [place], [place] or [facility] in [place], [place] in next 30 days  to talk with agency about counseling support services through that agency AEB patient report of contacting  mental health support agency to inquire about counseling support for client               Interventions:  Spoke with client about her current medical needs and emotional needs             Discussed grief issues of client. Her son died in 2023-02-14.  Informed client of possible support with [facility] in [place], [place] and with [facility] in [place], [place].  Client has contact phone numbers for each counseling center and has been informed of addresses of each counseling center.              Discussed pain issues of client. Client spoke of pain in her right shoulder.client had fall in October of 2025. .                Talked with client about sleeping issues. She has decreased  sleep.                 Discussed client support with PCP, Dr. Newlin.  Client is appreciative of support from Dr. Newlin                 Discussed medication procurement. Discussed insurance coverage of client               Discussed client support from her spouse.  Discussed transport issues. Client said she has no transport problems at present.             Discussed ambulation. She said she gets fatigued occasionally in walking             Provided counseling support for client. Discussed grief symptoms experienced by client             Discussed program support with RN, LCSW, Pharmacist  Discussed movement challenges of client related to use of her right sghoulder. She sometimes has to have some help in getting dressed             Reviewed mood issues. She has sad mood occasionally . LCSW talked with client about possible benefits of her receiving counseling support. She had previously  received some counseling support through [facility]. Discussed counseling support agencies in area:  [facility] and [facility],               Encouraged client to call LCSW as needed for SW support at [phone]             Client was appreciative of call from LCSW today  Patient Goals/Self-Care Activities:  Connect with provider for ongoing mental health treatment.   Continue taking your medication as prescribed.   Attend scheduled appointments with PCP Practice self care and allow time for ADLs completion Call LCSW as needed for SW support Use coping skills to help reduced stress and manage symptoms faced Call [facility] in [place], [place] to inquire with agency about possible counseling support for client   Plan:   Telephone follow up appointment with care management team member scheduled for:  01/28/2025 at 2:00 PM         Please go to [facility] [address] [place] [phone]) if you  are experiencing a Mental Health or Behavioral Health Crisis or need someone to talk to.  Patient verbalized understanding of Care plan and visit instructions communicated this visit   Glendia Pear  MSW, LCSW [place]/Value Based Care [facility] Licensed Clinical Social Worker Direct Dial:  [phone] Fax:  [phone] Website:  [email or web address]

## 2024-12-18 NOTE — Patient Outreach (Signed)
 Complex Care Management   Visit Note  12/18/2024  Name:  Meagan Flores MRN: [id number] DOB: 02-Apr-1966  Situation: Referral received for Complex Care Management related to grief issues faced I obtained verbal consent from Patient.  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   COVID-19 virus infection 02/14/2020   Degenerative superior labral anterior-to-posterior (SLAP) tear of right shoulder 09/30/2020   Essential hypertension    Glenohumeral arthritis 12/27/2014   History of stroke    History of TIA (transient ischemic attack) 02/14/2020   Hyperlipidemia    Hypothyroidism    Impingement syndrome of right shoulder 01/15/17   Mixed hyperlipidemia    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/27/2014   Prediabetes    Preop cardiovascular exam 11/25/2020   Pulmonary hypertension, unspecified (HCC) 11/25/2020   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   Rotator cuff impingement syndrome of right shoulder 09/30/2020   Skin rash 02/17/2021   Stroke [facility])    Tendinopathy of right biceps tendon 09/30/2020   Thyroid  disease    TIA (transient ischemic attack) 04/15/2018   Tobacco use 09/28/2016    Assessment: Patient Reported Symptoms:  Cognitive Cognitive Status: Alert and oriented to person, place, and time, Able to follow simple commands Cognitive/Intellectual Conditions Management [RPT]: None reported or documented in medical history or problem list   Health Maintenance Behaviors: Stress management Health Facilitated by: Stress management  Neurological Neurological Review of Symptoms: Headaches, Weakness Neurological Management Strategies: Coping strategies  HEENT HEENT Symptoms Reported: Sudden change or loss of vision HEENT Management Strategies: Coping strategies    Cardiovascular Cardiovascular Symptoms Reported:  Fatigue Cardiovascular Management Strategies: Coping strategies, Adequate rest  Respiratory Respiratory Symptoms Reported: Shortness of breath Respiratory Management Strategies: Coping strategies  Endocrine Endocrine Symptoms Reported: Blurry vision, Shakiness, Weakness or fatigue    Gastrointestinal Gastrointestinal Symptoms Reported: Change in appetite Gastrointestinal Management Strategies: Adequate rest, Coping strategies    Genitourinary Genitourinary Symptoms Reported: Frequency Genitourinary Management Strategies: Adequate rest, Coping strategies  Integumentary Integumentary Symptoms Reported: Skin changes Additional Integumentary Details: itching; skin changes Skin Management Strategies: Adequate rest, Coping strategies  Musculoskeletal Musculoskelatal Symptoms Reviewed: Muscle pain, Joint pain Musculoskeletal Management Strategies: Coping strategies      Psychosocial Psychosocial Symptoms Reported: Sadness - if selected complete PHQ 2-9, Anxiety - if selected complete GAD, Depression - if selected complete PHQ 2-9 Additional Psychological Details: grief issues due to death of her son in 2023/01/15   Major Change/Loss/Stressor/Fears (CP): Death of a loved one, Medical condition, self Techniques to Cope with Loss/Stress/Change: Counseling, Diversional activities Quality of Family Relationships: supportive Do you feel physically threatened by others?: No    12/18/2024    PHQ2-9 Depression Screening   Little interest or pleasure in doing things Several days  Feeling down, depressed, or hopeless Several days  PHQ-2 - Total Score 2  Trouble falling or staying asleep, or sleeping too much Several days  Feeling tired or having little energy Several days  Poor appetite or overeating  Several days  Feeling bad about yourself - or that you are a failure or have let yourself or your family down Several days  Trouble concentrating on things, such as reading the newspaper or watching  television Several days  Moving or speaking so slowly that other people could have noticed.  Or the opposite - being so fidgety or restless that you  have been moving around a lot more than usual Several days  Thoughts that you would be better off dead, or hurting yourself in some way Not at all  PHQ2-9 Total Score 8  If you checked off any problems, how difficult have these problems made it for you to do your work, take care of things at home, or get along with other people Somewhat difficult  Depression Interventions/Treatment Medication, Counseling    Today's Vitals  Client did not mention any BP issues during call with LCSW today  Pain Scale: 0-10 Pain Score: 7  Pain Type: Chronic pain Pain Location: Shoulder Pain Orientation: Right Pain Descriptors / Indicators: Aching Patients Stated Pain Goal: 1 Pain Intervention(s): Relaxation  Medications Reviewed Today   Medications were not reviewed in this encounter     Recommendation:   PCP Follow-up Continue Current Plan of Care Call LCSW as needed for SW support Call counseling agency of choice in area to discuss possible counseling support for client. She has been discussing grief management issues Take medications as prescribed  Follow Up Plan:   Telephone follow up appointment date/time:  01/28/2025 at 2:00 PM    Glendia Pear  MSW, LCSW [place]/Value Based Care [facility] Licensed Clinical Social Worker Direct Dial:  [phone] Fax:  [phone] Website:  [email or web address]

## 2024-12-27 ENCOUNTER — Ambulatory Visit (INDEPENDENT_AMBULATORY_CARE_PROVIDER_SITE_OTHER)

## 2024-12-27 ENCOUNTER — Ambulatory Visit (INDEPENDENT_AMBULATORY_CARE_PROVIDER_SITE_OTHER): Admitting: Family

## 2024-12-27 ENCOUNTER — Ambulatory Visit: Payer: Self-pay

## 2024-12-27 VITALS — BP 133/83 | HR 69 | Ht 68.0 in | Wt 203.0 lb

## 2024-12-27 DIAGNOSIS — M25511 Pain in right shoulder: Secondary | ICD-10-CM | POA: Diagnosis not present

## 2024-12-27 MED ORDER — LIDOCAINE 5 % EX PTCH: 1.0000 | MEDICATED_PATCH | CUTANEOUS | 0 refills | Status: AC

## 2024-12-27 NOTE — Telephone Encounter (Signed)
 FYI Only or Action Required?: FYI only for provider: appointment scheduled on 01.29.26.  Patient was last seen in primary care on 09/28/2024 by Newlin, Enobong, MD.  Called Nurse Triage reporting Shoulder Pain.  Symptoms began several weeks ago.  Interventions attempted: OTC medications: Goody Powder, Lidocaine  patches.  Symptoms are: gradually worsening.  Triage Disposition: See PCP When Office is Open (Within 3 Days)  Patient/caregiver understands and will follow disposition?: Yes   Message from [place] T sent at 12/27/2024  8:22 AM EST  Summary: Right arm/shoulder pain   Reason for Triage: Pt reports she is having right arm and shoulder constant pain, that is worsening. Pt using patches for pain, but not helping.    Pt requesting appt for evaluation.      Reason for Disposition  [1] MODERATE pain (e.g., interferes with normal activities) AND [2] present > 3 days  Answer Assessment - Initial Assessment Questions 1. ONSET: When did the pain start?      X weeks  2. LOCATION: Where is the pain located?     Right arm/right shoulder  3. PAIN: How bad is the pain? (Scale 1-10; or mild, moderate, severe)      Sharp. 8/10  4. WORK OR EXERCISE: Has there been any recent work or exercise that involved this part of the body?     Denies.   5. CAUSE: What do you think is causing the shoulder pain?     Fall/Surgeries  6. OTHER SYMPTOMS: Pt denies neck pain, swelling, rash, fever, numbness, weakness        Pt reports right shoulder/arm pain Pt is taking OTC Goody powder and lidocaine  patches Pt scheduled for a visit on  01.29.26  for further evaluation. Pt agrees with plan of care, will call back for any worsening symptoms  Protocols used: Shoulder Pain-A-AH

## 2024-12-27 NOTE — Progress Notes (Signed)
 "   Patient ID: Meagan Flores, female    DOB: October 13, 1966  MRN: [id number]  CC: Shoulder Pain  Subjective: Meagan Flores is a 59 y.o. female who presents for shoulder pain.   Her concerns today include:  Right shoulder pain persisting several months after fall. Denies recent trauma/injury and red flag symptoms. States Lidocaine  patches help. States history of right shoulder surgery x 3.  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   Mild episode of recurrent major depressive disorder 03/23/2023   Grief at loss of child 03/23/2023   Chronic right shoulder pain 07/01/2022   BMI 33.0-33.9,adult 05/31/2022   Biceps tendonosis of right shoulder 02/10/2022   S/P arthroscopy of right shoulder 02/10/2022   Eczema 02/17/2021   Pulmonary hypertension, unspecified (HCC) 11/25/2020   Prediabetes    Degenerative superior labral anterior-to-posterior (SLAP) tear of right shoulder 09/30/2020   Rotator cuff impingement syndrome of right shoulder 09/30/2020   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   Tendinopathy of right biceps tendon 09/30/2020   Reactive depression 03/18/2020   Aneurysm 03/18/2020   History of TIA (transient ischemic attack) 02/14/2020   History of stroke    Mixed hyperlipidemia    Essential hypertension    Impingement syndrome of right shoulder 12/24/2016   Tobacco use 09/28/2016   Glenohumeral arthritis 12/27/2014   Postoperative hypothyroidism 09/27/2014     Medications Ordered Prior to Encounter[1]  Allergies[2]  Social History   Socioeconomic History   Marital status: Married    Spouse name: Not on file   Number of children: Not on file   Years of education: Not on file   Highest education level: Not on file  Occupational History   Not on file  Tobacco Use   Smoking status: Some Days    Current packs/day: 0.50    Average packs/day: 0.5 packs/day for 34.0 years (17.0 ttl pk-yrs)    Types: Cigarettes    Smokeless tobacco: Never  Vaping Use   Vaping status: Never Used  Substance and Sexual Activity   Alcohol use: Yes    Comment: occassional   Drug use: No   Sexual activity: Not on file  Other Topics Concern   Not on file  Social History Narrative   Not on file   Social Drivers of Health   Tobacco Use: High Risk (12/06/2024)   Received from [facility]   Patient History    Smoking Tobacco Use: Every Day    Smokeless Tobacco Use: Current    Passive Exposure: Current  Financial Resource Strain: Medium Risk (10/11/2023)   Overall Financial Resource Strain (CARDIA)    Difficulty of Paying Living Expenses: Somewhat hard  Food Insecurity: Food Insecurity Present (12/18/2024)   Epic    Worried About Programme Researcher, Broadcasting/film/video in the Last Year: Sometimes true    [employer] of Food in the Last Year: Sometimes true  Transportation Needs: No Transportation Needs (06/18/2024)   Epic    Lack of Transportation (Medical): No    Lack of Transportation (Non-Medical): No  Physical Activity: Inactive (12/18/2024)   Exercise Vital Sign    Days of Exercise per Week: 0 days    Minutes of Exercise per Session: 0 min  Stress: Stress Concern Present (12/18/2024)   [employer] of Occupational Health - Occupational Stress Questionnaire    Feeling of Stress: To some extent  Social Connections: Unknown (10/11/2023)   Social Connection and Isolation Panel    Frequency  of Communication with Friends and Family: Three times a week    Frequency of Social Gatherings with Friends and Family: Never    Attends Religious Services: Not on file    Active Member of Clubs or Organizations: No    Attends Banker Meetings: Not on file    Marital Status: Married  Intimate Partner Violence: Not At Risk (06/18/2024)   Epic    Fear of Current or Ex-Partner: No    Emotionally Abused: No    Physically Abused: No    Sexually Abused: No  Depression (PHQ2-9): Medium Risk (12/18/2024)   Depression (PHQ2-9)     PHQ-2 Score: 8  Alcohol Screen: Low Risk (10/11/2023)   Alcohol Screen    Last Alcohol Screening Score (AUDIT): 1  Housing: Unknown (06/18/2024)   Epic    Unable to Pay for Housing in the Last Year: No    Number of Times Moved in the Last Year: Not on file    Homeless in the Last Year: No  Utilities: Not At Risk (06/18/2024)   Epic    Threatened with loss of utilities: No  Health Literacy: Adequate Health Literacy (10/11/2023)   B1300 Health Literacy    Frequency of need for help with medical instructions: Never    Family History  Problem Relation Age of Onset   Stomach cancer Sister    Colon polyps Sister    Esophageal cancer Neg Hx    Rectal cancer Neg Hx    Colon cancer Neg Hx    Breast cancer Neg Hx     Past Surgical History:  Procedure Laterality Date   BICEPT TENODESIS Right 12/16/2021   Procedure: RIGHT BICEPS TENODESIS;  Surgeon: Jerri Kay HERO, MD;  Location: [facility];  Service: Orthopedics;  Laterality: Right;   PARTIAL HYSTERECTOMY     right arm sx     SHOULDER ARTHROSCOPY WITH BICEPSTENOTOMY Right 01/21/2021   Procedure: SHOULDER ARTHROSCOPY WITH BICEPSTENOTOMY;  Surgeon: Jerri Kay HERO, MD;  Location: [facility];  Service: Orthopedics;  Laterality: Right;   SHOULDER ARTHROSCOPY WITH DISTAL CLAVICLE RESECTION Right 01/12/2017   Procedure: RIGHT SHOULDER ARTHROSCOPY WITH  SUBACROMIAL DECOMPRESSION, DISTAL CLAVICLE EXCISION, extensive debridement;  Surgeon: Kay HERO Jerri, MD;  Location: [facility];  Service: Orthopedics;  Laterality: Right;   THYMECTOMY  2004   THYROIDECTOMY      ROS: Review of Systems Negative except as stated above  PHYSICAL EXAM: BP 133/83   Pulse 69   Ht 5' 8 (1.727 m)   Wt 203 lb (92.1 kg)   SpO2 (!) 10%   BMI 30.87 kg/m   Physical Exam HENT:     Head: Normocephalic and atraumatic.     Nose: Nose normal.     Mouth/Throat:     Mouth: Mucous membranes are moist.     Pharynx: Oropharynx is  clear.  Eyes:     Extraocular Movements: Extraocular movements intact.     Conjunctiva/sclera: Conjunctivae normal.     Pupils: Pupils are equal, round, and reactive to light.  Cardiovascular:     Rate and Rhythm: Normal rate and regular rhythm.     Pulses: Normal pulses.     Heart sounds: Normal heart sounds.  Pulmonary:     Effort: Pulmonary effort is normal.     Breath sounds: Normal breath sounds.  Musculoskeletal:        General: Normal range of motion.     Right shoulder: Tenderness present.  Left shoulder: Normal.     Right upper arm: Normal.     Left upper arm: Normal.     Right elbow: Normal.     Left elbow: Normal.     Right forearm: Normal.     Left forearm: Normal.     Right wrist: Normal.     Left wrist: Normal.     Right hand: Normal.     Left hand: Normal.     Cervical back: Normal range of motion and neck supple.  Neurological:     General: No focal deficit present.     Mental Status: She is alert and oriented to person, place, and time.  Psychiatric:        Mood and Affect: Mood normal.        Behavior: Behavior normal.     ASSESSMENT AND PLAN: 1. Right shoulder pain, unspecified chronicity (Primary) - Continue Lidocaine  as prescribed. Counseled on medication adherence/adverse effects.  - DG Shoulder Right for evaluation.  - Referral to Orthopedics for evaluation/management.  - Follow-up with primary provider as scheduled.  - lidocaine  (LIDODERM ) 5 %; Place 1 patch onto the skin daily. Remove & Discard patch within 12 hours or as directed by MD  Dispense: 90 patch; Refill: 0 - DG Shoulder Right; Future - Ambulatory referral to Orthopedic Surgery  Patient was given the opportunity to ask questions.  Patient verbalized understanding of the plan and was able to repeat key elements of the plan. Patient was given clear instructions to go to Emergency Department or return to medical center if symptoms don't improve, worsen, or new problems develop.The  patient verbalized understanding.   Orders Placed This Encounter  Procedures   DG Shoulder Right   Ambulatory referral to Orthopedic Surgery     Requested Prescriptions   Signed Prescriptions Disp Refills   lidocaine  (LIDODERM ) 5 % 90 patch 0    Sig: Place 1 patch onto the skin daily. Remove & Discard patch within 12 hours or as directed by MD    Return for Follow-Up or next available with Enobong Newlin, MD.  Greig JINNY Chute, NP      [1]  Current Outpatient Medications on File Prior to Visit  Medication Sig Dispense Refill   acetaminophen -codeine  (TYLENOL  #3) 300-30 MG tablet Take 1 tablet by mouth every 4 (four) hours as needed for moderate pain or severe pain. 60 tablet 1   aspirin  EC 81 MG tablet Take 1 tablet (81 mg total) by mouth daily. 60 tablet 3   buPROPion  (WELLBUTRIN  XL) 150 MG 24 hr tablet Take 1 tablet (150 mg total) by mouth daily. For smoking cessation 90 tablet 1   clotrimazole  (LOTRIMIN ) 1 % cream Apply 1 Application topically 2 (two) times daily. 30 g 0   diclofenac  (VOLTAREN ) 75 MG EC tablet Take 1 tablet (75 mg total) by mouth 2 (two) times daily. 30 tablet 0   escitalopram  (LEXAPRO ) 10 MG tablet Take 1 tablet (10 mg total) by mouth daily. 90 tablet 1   estradiol  (ESTRACE  VAGINAL) 0.1 MG/GM vaginal cream Apply a pea size amount to vulva twice a week 42.5 g 1   Evolocumab  (REPATHA  SURECLICK) 140 MG/ML SOAJ Inject 140 mg into the skin every 14 (fourteen) days. 6 mL 3   ezetimibe  (ZETIA ) 10 MG tablet Take 1 tablet (10 mg total) by mouth daily. 90 tablet 3   levothyroxine  (SYNTHROID ) 125 MCG tablet TAKE 1 TABLET BY MOUTH ONCE DAILY BEFORE BREAKFAST **DOSE  INCREASE** 90 tablet 1  metoprolol  succinate (TOPROL  XL) 25 MG 24 hr tablet Take 1 tablet (25 mg total) by mouth daily. 90 tablet 1   mupirocin  ointment (BACTROBAN ) 2 % Apply topically.     naproxen  (NAPROSYN ) 500 MG tablet Take 1 tablet (500 mg total) by mouth 2 (two) times daily with a meal. 60 tablet 1    nicotine  polacrilex (NICORETTE  MINI) 4 MG lozenge Use two times a day to reduce tobacco use 100 tablet 4   rosuvastatin  (CRESTOR ) 40 MG tablet Take 1 tablet (40 mg total) by mouth daily. 90 tablet 1   tiZANidine  (ZANAFLEX ) 4 MG tablet Take 1 tablet (4 mg total) by mouth every 8 (eight) hours as needed. 180 tablet 1   traZODone  (DESYREL ) 100 MG tablet Take 1 tablet (100 mg total) by mouth at bedtime as needed for sleep. 90 tablet 1   triamcinolone  cream (KENALOG ) 0.1 % Apply 1 Application topically 2 (two) times daily. 45 g 2   No current facility-administered medications on file prior to visit.  [2]  Allergies Allergen Reactions   Sulfa Antibiotics Hives   "

## 2024-12-27 NOTE — Telephone Encounter (Signed)
 Noted.

## 2025-01-01 ENCOUNTER — Ambulatory Visit: Payer: Self-pay | Admitting: Family

## 2025-01-01 ENCOUNTER — Ambulatory Visit: Admitting: Podiatry

## 2025-01-04 ENCOUNTER — Telehealth: Payer: Self-pay

## 2025-01-04 ENCOUNTER — Other Ambulatory Visit: Payer: Self-pay

## 2025-01-04 NOTE — Telephone Encounter (Signed)
 Pharmacy Patient Advocate Encounter   Received notification from CoverMyMeds that prior authorization for LIDOCAINE  is required/requested.   Insurance verification completed.   The patient is insured through [facility] MEDICAID.   Per test claim: PA required; PA submitted to above mentioned insurance via [facility] Tracks Key/confirmation #/EOC [id number] W Status is pending

## 2025-01-04 NOTE — Telephone Encounter (Signed)
 Pharmacy Patient Advocate Encounter  Received notification from [facility] MEDICAID that Prior Authorization for LIDOCAINE  5% PATCH has been APPROVED from 01/04/2025 to 01/04/2026   PA #/Case ID/Reference #: [id number]

## 2025-01-08 ENCOUNTER — Ambulatory Visit: Admitting: Podiatry

## 2025-01-14 ENCOUNTER — Encounter

## 2025-01-16 ENCOUNTER — Encounter: Admitting: Orthopedic Surgery

## 2025-01-28 ENCOUNTER — Encounter: Admitting: Gastroenterology

## 2025-01-28 ENCOUNTER — Telehealth: Admitting: Licensed Clinical Social Worker

## 2025-03-28 ENCOUNTER — Ambulatory Visit: Admitting: Family Medicine
